# Patient Record
Sex: Male | Born: 1940
Health system: Southern US, Community
[De-identification: ages and names within clinical notes are randomized; demographics above are authoritative.]

## PROBLEM LIST (undated history)

## (undated) DIAGNOSIS — I2510 Atherosclerotic heart disease of native coronary artery without angina pectoris: Secondary | ICD-10-CM

## (undated) DIAGNOSIS — E118 Type 2 diabetes mellitus with unspecified complications: Secondary | ICD-10-CM

## (undated) DIAGNOSIS — K469 Unspecified abdominal hernia without obstruction or gangrene: Secondary | ICD-10-CM

## (undated) DIAGNOSIS — I5022 Chronic systolic (congestive) heart failure: Secondary | ICD-10-CM

## (undated) DIAGNOSIS — E785 Hyperlipidemia, unspecified: Secondary | ICD-10-CM

## (undated) DIAGNOSIS — I428 Other cardiomyopathies: Secondary | ICD-10-CM

## (undated) DIAGNOSIS — R1312 Dysphagia, oropharyngeal phase: Secondary | ICD-10-CM

## (undated) DIAGNOSIS — F418 Other specified anxiety disorders: Secondary | ICD-10-CM

## (undated) DIAGNOSIS — J301 Allergic rhinitis due to pollen: Secondary | ICD-10-CM

## (undated) DIAGNOSIS — K210 Gastro-esophageal reflux disease with esophagitis: Secondary | ICD-10-CM

## (undated) DIAGNOSIS — I10 Essential (primary) hypertension: Secondary | ICD-10-CM

## (undated) DIAGNOSIS — G7249 Other inflammatory and immune myopathies, not elsewhere classified: Secondary | ICD-10-CM

## (undated) DIAGNOSIS — G5793 Unspecified mononeuropathy of bilateral lower limbs: Secondary | ICD-10-CM

## (undated) HISTORY — DX: Essential (primary) hypertension: I10

## (undated) HISTORY — DX: Allergic rhinitis due to pollen: J30.1

## (undated) HISTORY — DX: Other inflammatory and immune myopathies, not elsewhere classified: G72.49

## (undated) HISTORY — DX: Atherosclerotic heart disease of native coronary artery without angina pectoris: I25.10

## (undated) HISTORY — PX: BACK SURGERY: SHX140

## (undated) HISTORY — DX: Hyperlipidemia, unspecified: E78.5

## (undated) HISTORY — DX: Dysphagia, oropharyngeal phase: R13.12

## (undated) HISTORY — DX: Gastro-esophageal reflux disease with esophagitis: K21.0

## (undated) HISTORY — DX: Unspecified mononeuropathy of bilateral lower limbs: G57.93

## (undated) HISTORY — DX: Chronic systolic (congestive) heart failure: I50.22

## (undated) HISTORY — PX: HERNIA REPAIR: SHX51

## (undated) HISTORY — DX: Type 2 diabetes mellitus with unspecified complications: E11.8

## (undated) HISTORY — DX: Other cardiomyopathies: I42.8

## (undated) HISTORY — DX: Other specified anxiety disorders: F41.8

## (undated) HISTORY — PX: SPINE SURGERY: SHX786

---

## 1998-08-08 ENCOUNTER — Encounter: Payer: Self-pay | Admitting: Family Medicine

## 1998-08-08 ENCOUNTER — Ambulatory Visit (HOSPITAL_COMMUNITY): Admission: RE | Admit: 1998-08-08 | Discharge: 1998-08-08 | Payer: Self-pay | Admitting: Family Medicine

## 1999-06-20 ENCOUNTER — Ambulatory Visit (HOSPITAL_COMMUNITY): Admission: RE | Admit: 1999-06-20 | Discharge: 1999-06-20 | Payer: Self-pay | Admitting: Cardiovascular Disease

## 2000-01-09 ENCOUNTER — Encounter: Payer: Self-pay | Admitting: Family Medicine

## 2000-01-09 ENCOUNTER — Ambulatory Visit (HOSPITAL_COMMUNITY): Admission: RE | Admit: 2000-01-09 | Discharge: 2000-01-09 | Payer: Self-pay | Admitting: Family Medicine

## 2001-04-28 ENCOUNTER — Ambulatory Visit (HOSPITAL_COMMUNITY): Admission: RE | Admit: 2001-04-28 | Discharge: 2001-04-28 | Payer: Self-pay | Admitting: Family Medicine

## 2002-11-09 ENCOUNTER — Encounter: Payer: Self-pay | Admitting: General Surgery

## 2002-11-09 ENCOUNTER — Encounter: Admission: RE | Admit: 2002-11-09 | Discharge: 2002-11-09 | Payer: Self-pay | Admitting: General Surgery

## 2003-08-10 ENCOUNTER — Ambulatory Visit (HOSPITAL_COMMUNITY): Admission: RE | Admit: 2003-08-10 | Discharge: 2003-08-10 | Payer: Self-pay | Admitting: Gastroenterology

## 2003-08-17 ENCOUNTER — Ambulatory Visit (HOSPITAL_COMMUNITY): Admission: RE | Admit: 2003-08-17 | Discharge: 2003-08-17 | Payer: Self-pay | Admitting: General Surgery

## 2003-08-18 ENCOUNTER — Emergency Department (HOSPITAL_COMMUNITY): Admission: EM | Admit: 2003-08-18 | Discharge: 2003-08-18 | Payer: Self-pay | Admitting: Emergency Medicine

## 2013-04-12 ENCOUNTER — Other Ambulatory Visit: Payer: Self-pay | Admitting: Family Medicine

## 2013-04-12 DIAGNOSIS — M79669 Pain in unspecified lower leg: Secondary | ICD-10-CM

## 2013-04-12 DIAGNOSIS — I739 Peripheral vascular disease, unspecified: Secondary | ICD-10-CM

## 2013-04-15 ENCOUNTER — Ambulatory Visit
Admission: RE | Admit: 2013-04-15 | Discharge: 2013-04-15 | Disposition: A | Discharge status: Home / Self Care | Payer: Medicare Other | Source: Ambulatory Visit | Attending: Family Medicine | Admitting: Family Medicine

## 2013-04-15 DIAGNOSIS — M79669 Pain in unspecified lower leg: Secondary | ICD-10-CM

## 2013-04-15 DIAGNOSIS — I739 Peripheral vascular disease, unspecified: Secondary | ICD-10-CM

## 2014-05-17 ENCOUNTER — Other Ambulatory Visit: Payer: Self-pay | Admitting: Family Medicine

## 2014-05-17 DIAGNOSIS — G459 Transient cerebral ischemic attack, unspecified: Secondary | ICD-10-CM

## 2014-05-24 ENCOUNTER — Encounter (INDEPENDENT_AMBULATORY_CARE_PROVIDER_SITE_OTHER): Payer: Self-pay

## 2014-05-24 ENCOUNTER — Ambulatory Visit
Admission: RE | Admit: 2014-05-24 | Discharge: 2014-05-24 | Disposition: A | Discharge status: Home / Self Care | Payer: Medicare Other | Source: Ambulatory Visit | Attending: Family Medicine | Admitting: Family Medicine

## 2014-05-24 DIAGNOSIS — G459 Transient cerebral ischemic attack, unspecified: Secondary | ICD-10-CM

## 2014-12-16 ENCOUNTER — Encounter (HOSPITAL_COMMUNITY): Payer: Self-pay | Admitting: *Deleted

## 2014-12-16 ENCOUNTER — Emergency Department (HOSPITAL_COMMUNITY)
Admission: EM | Admit: 2014-12-16 | Discharge: 2014-12-16 | Disposition: A | Discharge status: Home / Self Care | Payer: Medicare Other | Attending: Emergency Medicine | Admitting: Emergency Medicine

## 2014-12-16 ENCOUNTER — Emergency Department (HOSPITAL_COMMUNITY): Payer: Medicare Other

## 2014-12-16 DIAGNOSIS — Z880 Allergy status to penicillin: Secondary | ICD-10-CM | POA: Insufficient documentation

## 2014-12-16 DIAGNOSIS — Z8719 Personal history of other diseases of the digestive system: Secondary | ICD-10-CM | POA: Diagnosis not present

## 2014-12-16 DIAGNOSIS — R110 Nausea: Secondary | ICD-10-CM | POA: Diagnosis not present

## 2014-12-16 DIAGNOSIS — R42 Dizziness and giddiness: Secondary | ICD-10-CM | POA: Diagnosis not present

## 2014-12-16 DIAGNOSIS — Z79899 Other long term (current) drug therapy: Secondary | ICD-10-CM | POA: Diagnosis not present

## 2014-12-16 HISTORY — DX: Unspecified abdominal hernia without obstruction or gangrene: K46.9

## 2014-12-16 LAB — COMPREHENSIVE METABOLIC PANEL
ALK PHOS: 45 U/L (ref 39–117)
ALT: 26 U/L (ref 0–53)
AST: 21 U/L (ref 0–37)
Albumin: 4.1 g/dL (ref 3.5–5.2)
Anion gap: 7 (ref 5–15)
BUN: 15 mg/dL (ref 6–23)
CALCIUM: 9.4 mg/dL (ref 8.4–10.5)
CHLORIDE: 101 mmol/L (ref 96–112)
CO2: 30 mmol/L (ref 19–32)
CREATININE: 0.95 mg/dL (ref 0.50–1.35)
GFR, EST NON AFRICAN AMERICAN: 80 mL/min — AB (ref >90)
GLUCOSE: 135 mg/dL — AB (ref 70–99)
POTASSIUM: 4.1 mmol/L (ref 3.5–5.1)
SODIUM: 138 mmol/L (ref 135–145)
Total Bilirubin: 1.3 mg/dL — ABNORMAL HIGH (ref 0.3–1.2)
Total Protein: 6.5 g/dL (ref 6.0–8.3)

## 2014-12-16 LAB — CBC
HCT: 47.4 % (ref 39.0–52.0)
Hemoglobin: 15.9 g/dL (ref 13.0–17.0)
MCH: 29.8 pg (ref 26.0–34.0)
MCHC: 33.5 g/dL (ref 30.0–36.0)
MCV: 88.9 fL (ref 78.0–100.0)
Platelets: 203 10*3/uL (ref 150–400)
RBC: 5.33 MIL/uL (ref 4.22–5.81)
RDW: 13.5 % (ref 11.5–15.5)
WBC: 7.2 10*3/uL (ref 4.0–10.5)

## 2014-12-16 LAB — DIFFERENTIAL
BASOS PCT: 0 % (ref 0–1)
Basophils Absolute: 0 10*3/uL (ref 0.0–0.1)
EOS ABS: 0.1 10*3/uL (ref 0.0–0.7)
Eosinophils Relative: 2 % (ref 0–5)
LYMPHS ABS: 1.6 10*3/uL (ref 0.7–4.0)
Lymphocytes Relative: 22 % (ref 12–46)
MONOS PCT: 8 % (ref 3–12)
Monocytes Absolute: 0.6 10*3/uL (ref 0.1–1.0)
Neutro Abs: 4.9 10*3/uL (ref 1.7–7.7)
Neutrophils Relative %: 68 % (ref 43–77)

## 2014-12-16 LAB — PROTIME-INR
INR: 0.98 (ref 0.00–1.49)
PROTHROMBIN TIME: 13.1 s (ref 11.6–15.2)

## 2014-12-16 LAB — I-STAT TROPONIN, ED: Troponin i, poc: 0.01 ng/mL (ref 0.00–0.08)

## 2014-12-16 LAB — APTT: aPTT: 24 seconds (ref 24–37)

## 2014-12-16 MED ORDER — LORAZEPAM 1 MG PO TABS
1.0000 mg | ORAL_TABLET | Freq: Once | ORAL | Status: AC
  Administered 2014-12-16: 1 mg via ORAL
  Filled 2014-12-16: qty 1

## 2014-12-16 MED ORDER — MECLIZINE HCL 50 MG PO TABS: 25.0000 mg | ORAL_TABLET | Freq: Two times a day (BID) | ORAL | Status: DC

## 2014-12-16 MED ORDER — ONDANSETRON HCL 4 MG PO TABS: 4.0000 mg | ORAL_TABLET | Freq: Four times a day (QID) | ORAL | Status: DC

## 2014-12-16 MED ORDER — MECLIZINE HCL 50 MG PO TABS: 25.0000 mg | ORAL_TABLET | Freq: Three times a day (TID) | ORAL | Status: DC | PRN

## 2014-12-16 NOTE — ED Provider Notes (Signed)
CSN: [id number]     Arrival date & time 12/16/14  1417 History   First MD Initiated Contact with Patient 12/16/14 1704     Chief Complaint  Patient presents with  . Dizziness  . Nausea     (Consider location/radiation/quality/duration/timing/severity/associated sxs/prior Treatment) Patient is a 74 y.o. male presenting with dizziness. The history is provided by the patient. No language interpreter was used.  Dizziness Associated symptoms: nausea   Associated symptoms: no blood in stool, no chest pain, no headaches, no shortness of breath, no vomiting and no weakness   Christian [name] presents with new onset dizziness and nausea at 10am this morning while getting out of bed. He describes it as being off balance.  He does not have any pain, dizziness, or nausea now. He is currently taking Claritin D for ear congestion. He denies tinnitus, cough, rhinorrhea. He denies fever, chest pain, shortness of breath, loss of consciousness, syncope, or vomiting.  He has a history of a prior TIA.   Past Medical History  Diagnosis Date  . Hernia of abdominal cavity    Past Surgical History  Procedure Laterality Date  . Back surgery     History reviewed. No pertinent family history. History  Substance Use Topics  . Smoking status: Never Smoker   . Smokeless tobacco: Not on file  . Alcohol Use: No    Review of Systems  Constitutional: Negative for fever and chills.  Eyes: Negative for visual disturbance.  Respiratory: Negative for cough, chest tightness and shortness of breath.   Cardiovascular: Negative for chest pain and leg swelling.  Gastrointestinal: Positive for nausea. Negative for vomiting, abdominal pain and blood in stool.  Genitourinary: Negative for hematuria and difficulty urinating.  Musculoskeletal: Negative for neck pain.  Skin: Negative for rash.  Neurological: Positive for dizziness. Negative for seizures, syncope, speech difficulty, weakness, light-headedness, numbness and  headaches.  Hematological: Does not bruise/bleed easily.  All other systems reviewed and are negative.     Allergies  Amoxicillin; Bactrim; Cephalexin; Hydrocodone; Lipitor; Oxycodone; and Vytorin  Home Medications   Prior to Admission medications   Medication Sig Start Date End Date Taking? Authorizing Provider  Cholecalciferol (VITAMIN D) 2000 UNITS tablet Take 2,000 Units by mouth daily.   Yes Historical Provider, MD  esomeprazole (NEXIUM) 40 MG capsule Take 40 mg by mouth daily as needed (acid reflux).   Yes Historical Provider, MD  loratadine-pseudoephedrine (CLARITIN-D 12-HOUR) 5-120 MG per tablet Take 1 tablet by mouth 2 (two) times daily as needed for allergies (ear congestion).   Yes Historical Provider, MD  olopatadine (PATANOL) 0.1 % ophthalmic solution Place 1 drop into both eyes daily.   Yes Historical Provider, MD  meclizine (ANTIVERT) 50 MG tablet Take 0.5 tablets (25 mg total) by mouth 3 (three) times daily as needed. 12/16/14   Markian Glockner Patel-Mills, PA-C  ondansetron (ZOFRAN) 4 MG tablet Take 1 tablet (4 mg total) by mouth every 6 (six) hours. 12/16/14   Thaddaeus Granja Patel-Mills, PA-C   BP 138/96 mmHg  Pulse 97  Temp(Src) 97.5 F (36.4 C) (Oral)  Resp 20  Ht 5\' 6"  (1.676 m)  Wt 168 lb (76.204 kg)  BMI 27.13 kg/m2  SpO2 96% Physical Exam  Constitutional: He is oriented to person, place, and time. He appears well-developed and well-nourished.  HENT:  Head: Normocephalic and atraumatic.  Eyes: Conjunctivae are normal.  Neck: Normal range of motion. Neck supple.  Cardiovascular: Normal rate, regular rhythm and normal heart sounds.   Pulmonary/Chest: Effort normal  and breath sounds normal.  Abdominal: Soft. There is no tenderness.  Musculoskeletal: Normal range of motion.  Neurological: He is alert and oriented to person, place, and time. He has normal strength. No cranial nerve deficit or sensory deficit. Coordination normal.  Skin: Skin is warm and dry.  Nursing note and  vitals reviewed.   ED Course  Procedures (including critical care time) Labs Review Labs Reviewed  COMPREHENSIVE METABOLIC PANEL - Abnormal; Notable for the following:    Glucose, Bld 135 (*)    Total Bilirubin 1.3 (*)    GFR calc non Af Amer 80 (*)    All other components within normal limits  PROTIME-INR  APTT  CBC  DIFFERENTIAL  Randolm Idol, ED    Imaging Review Mr Brain Wo Contrast  12/16/2014   CLINICAL DATA:  Awoke with dizziness and nausea. Hypertension. Dizziness improved when lying down.  EXAM: MRI HEAD WITHOUT CONTRAST  TECHNIQUE: Multiplanar, multiecho pulse sequences of the brain and surrounding structures were obtained without intravenous contrast.  COMPARISON:  None.  FINDINGS: The diffusion-weighted images demonstrate no evidence for acute or subacute infarction. No hemorrhage or mass lesion is present.  Mild generalized atrophy is present. Moderate periventricular and scattered subcortical T2 hyperintensities are greater than expected for age. There is relatively little extension of white matter disease into the posterior fossa. No significant extraaxial fluid collection is present.  Flow is present in the major intracranial arteries. Globes and orbits are intact. The paranasal sinuses and mastoid air cells are clear. The skullbase is within normal limits. Midline structures are unremarkable.  IMPRESSION: 1. No acute or focal lesion to explain the patient's symptoms. 2. Moderate age advanced periventricular and subcortical white matter disease bilaterally.   Electronically Signed   By: San Morelle M.D.   On: 12/16/2014 19:16     EKG Interpretation   Date/Time:  Saturday December 16 2014 14:29:27 EST Ventricular Rate:  93 PR Interval:  182 QRS Duration: 80 QT Interval:  370 QTC Calculation: 460 R Axis:   -6 Text Interpretation:  Normal sinus rhythm Anterior infarct , age  undetermined Abnormal ECG Since last tracing rate faster Confirmed by  MILLER   MD, BRIAN ([id number]) on 12/16/2014 5:03:12 PM      MDM   Final diagnoses:  Dizziness    Imaging; MRI negative for acute lesion.  Labs; troponin negative  Impression; No sign of intracranial bleed.  Will dispo with Meclizine and Zofran.  He has an appointment with his otolaryngologist next week.    Ottie Glazier, PA-C 12/16/14 2101  Johnna Acosta, MD 12/17/14 0000

## 2014-12-16 NOTE — Discharge Instructions (Signed)
Vertigo Vertigo means you feel like you are moving when you are not. Vertigo can make you feel like things around you are moving when they are not. This problem often goes away on its own.  HOME CARE   Follow your doctor's instructions.  Avoid driving.  Avoid using heavy machinery.  Avoid doing any activity that could be dangerous if you have a vertigo attack.  Tell your doctor if a medicine seems to cause your vertigo. GET HELP RIGHT AWAY IF:   Your medicines do not help or make you feel worse.  You have trouble talking or walking.  You feel weak or have trouble using your arms, hands, or legs.  You have bad headaches.  You keep feeling sick to your stomach (nauseous) or throwing up (vomiting).  Your vision changes.  A family member notices changes in your behavior.  Your problems get worse. MAKE SURE YOU:  Understand these instructions.  Will watch your condition.  Will get help right away if you are not doing well or get worse. Document Released: 07/15/2008 Document Revised: 12/29/2011 Document Reviewed: 04/24/2011 Exit[employer] Patient Information 2015 [employer]. This information is not intended to replace advice given to you by your health care provider. Make sure you discuss any questions you have with your health care provider.  

## 2014-12-16 NOTE — ED Notes (Signed)
Patient transported to MRI 

## 2014-12-16 NOTE — ED Provider Notes (Signed)
The patient is a 74 year old male who presents after becoming acutely dizzy this morning. He describes this as feeling off balance like he could not walk straight. He did become nauseated and he does report a recent history of ear congestion for which she has been taking Claritin-D. He denies tinnitus, denies a vertigo feeling, states that the symptoms had gone away while he was sitting down and then recurred when he stood up again. At this time he has no symptoms, he denies fevers chills nausea vomiting, chest pain shortness of breath coughing fevers chills abdominal pain diarrhea dysuria rashes or swelling. He has no numbness or weakness.  Speech is clear, cranial nerves III through XII are intact, memory is intact, strength is normal in all 4 extremities including grips, sensation is intact to light touch and pinprick in all 4 extremities. Coordination as tested by finger-nose-finger is normal, no limb ataxia. Normal gait, normal reflexes at the patellar tendons bilaterally, there are no carotid bruits, clear heart and lung sounds, no peripheral edema.  The patient has an unremarkable EKG.   EKG Interpretation  Date/Time:  Saturday December 16 2014 14:29:27 EST Ventricular Rate:  93 PR Interval:  182 QRS Duration: 80 QT Interval:  370 QTC Calculation: 460 R Axis:   -6 Text Interpretation:  Normal sinus rhythm Anterior infarct , age undetermined Abnormal ECG Since last tracing rate faster Confirmed by Jadaya Sommerfield  MD, Rhyker Silversmith ([id number]) on 12/16/2014 5:03:12 PM      He will need an MRI to further evaluate his posterior circulation as his symptoms could be related to a posterior stroke though given his her congestion and recent use of these medications it would suggest that this is the type of vertigo. It away he is not having symptoms at this time, he is mildly hypertensive which also may be related to the medications.  MRI neg, pt stable for d/c.  Medical screening examination/treatment/procedure(s)  were conducted as a shared visit with non-physician practitioner(s) and myself.  I personally evaluated the patient during the encounter.  Clinical Impression:   Final diagnoses:  Dizziness         Johnna Acosta, MD 12/17/14 0000

## 2014-12-16 NOTE — ED Notes (Signed)
Pt reports waking up this am with dizziness, nausea and reports htn. bp is 156/105 at triage. Reports dizziness improves when lying down and pt feels like room is spinning.

## 2016-07-09 LAB — HEMOGLOBIN A1C: Hemoglobin A1C: 5.8

## 2016-07-09 LAB — BASIC METABOLIC PANEL
BUN: 20 (ref 4–21)
CREATININE: 1 (ref 0.6–1.3)
GLUCOSE: 112
POTASSIUM: 4.2 (ref 3.4–5.3)
SODIUM: 139 (ref 137–147)

## 2016-07-09 LAB — LIPID PANEL
Cholesterol: 263 — AB (ref 0–200)
HDL: 46 (ref 35–70)
LDL Cholesterol: 195
Triglycerides: 112 (ref 40–160)

## 2016-07-09 LAB — CBC AND DIFFERENTIAL
HCT: 46 (ref 41–53)
Hemoglobin: 14.7 (ref 13.5–17.5)
Platelets: 229 (ref 150–399)
WBC: 6.2

## 2016-07-09 LAB — HEPATIC FUNCTION PANEL
ALT: 15 (ref 10–40)
AST: 18 (ref 14–40)
Alkaline Phosphatase: 38 (ref 25–125)
BILIRUBIN, TOTAL: 1.2

## 2016-07-09 LAB — TSH: TSH: 0.83 (ref 0.41–5.90)

## 2017-05-13 ENCOUNTER — Telehealth: Payer: Self-pay | Admitting: Internal Medicine

## 2017-05-13 NOTE — Telephone Encounter (Signed)
Patient is requesting to establish care with Dr. Jones.  Please advise.   °

## 2017-05-14 NOTE — Telephone Encounter (Signed)
yes

## 2017-05-15 NOTE — Telephone Encounter (Signed)
Left patient vm to call back to establish care

## 2017-05-25 ENCOUNTER — Encounter: Payer: Self-pay | Admitting: Internal Medicine

## 2017-05-25 ENCOUNTER — Ambulatory Visit (INDEPENDENT_AMBULATORY_CARE_PROVIDER_SITE_OTHER): Payer: Medicare Other | Admitting: Internal Medicine

## 2017-05-25 VITALS — BP 140/90 | HR 96 | Temp 98.2°F | Resp 16 | Ht 66.0 in | Wt 177.2 lb

## 2017-05-25 DIAGNOSIS — G7249 Other inflammatory and immune myopathies, not elsewhere classified: Secondary | ICD-10-CM

## 2017-05-25 DIAGNOSIS — E785 Hyperlipidemia, unspecified: Secondary | ICD-10-CM | POA: Insufficient documentation

## 2017-05-25 DIAGNOSIS — R739 Hyperglycemia, unspecified: Secondary | ICD-10-CM

## 2017-05-25 DIAGNOSIS — G5793 Unspecified mononeuropathy of bilateral lower limbs: Secondary | ICD-10-CM

## 2017-05-25 HISTORY — DX: Other inflammatory and immune myopathies, not elsewhere classified: G72.49

## 2017-05-25 HISTORY — DX: Hyperlipidemia, unspecified: E78.5

## 2017-05-25 HISTORY — DX: Unspecified mononeuropathy of bilateral lower limbs: G57.93

## 2017-05-25 MED ORDER — PREGABALIN 50 MG PO CAPS: 50.0000 mg | ORAL_CAPSULE | Freq: Two times a day (BID) | ORAL | 1 refills | Status: DC

## 2017-05-25 MED ORDER — ZOSTER VAC RECOMB ADJUVANTED 50 MCG/0.5ML IM SUSR: 0.5000 mL | Freq: Once | INTRAMUSCULAR | 1 refills | Status: AC

## 2017-05-25 NOTE — Progress Notes (Signed)
Subjective:  Patient ID: Christian [name], male    DOB: September 25, 1941  Age: 76 y.o. MRN: [id number]  CC: Leg Pain  NEW TO ME  HPI Christian [name] presents for the complaint of LE pain for 13 years. He claims that the symptoms started when he was prescribed a statin. He complains of intermittent feeling like his legs are numb but when he wakes up in the morning and tries to get his day started he has diffuse lower extremity discomfort that he describes as a sharp and stabbing sensation. The pain gets better when he runs on a treadmill or massages his thighs. He has also noticed that a course of prednisone makes the pain better. He has mild lower extremity weakness and has had a few episodes where he felt like the left leg was coming give way on him. He tells me his previous doctor did arterial flow studies that were normal. He has never had a NCS or EMG. He tells me he's had extensive lab work done regarding this but I have no copies of those lab results today. He had lumbar surgery many decades ago.  Outpatient Medications Prior to Visit  Medication Sig Dispense Refill  . Cholecalciferol (VITAMIN D) 2000 UNITS tablet Take 2,000 Units by mouth daily.    Marland Kitchen esomeprazole (NEXIUM) 40 MG capsule Take 40 mg by mouth daily as needed (acid reflux).    Marland Kitchen olopatadine (PATANOL) 0.1 % ophthalmic solution Place 1 drop into both eyes daily.    Marland Kitchen loratadine-pseudoephedrine (CLARITIN-D 12-HOUR) 5-120 MG per tablet Take 1 tablet by mouth 2 (two) times daily as needed for allergies (ear congestion).    . meclizine (ANTIVERT) 50 MG tablet Take 0.5 tablets (25 mg total) by mouth 3 (three) times daily as needed. 30 tablet 0  . ondansetron (ZOFRAN) 4 MG tablet Take 1 tablet (4 mg total) by mouth every 6 (six) hours. 12 tablet 0   No facility-administered medications prior to visit.     ROS Review of Systems  Constitutional: Negative for appetite change, diaphoresis, fatigue and unexpected weight change.  HENT: Negative.    Eyes: Negative.   Respiratory: Negative.  Negative for cough, chest tightness, shortness of breath and wheezing.   Cardiovascular: Negative for chest pain, palpitations and leg swelling.  Gastrointestinal: Negative for abdominal pain, constipation, diarrhea, nausea and vomiting.  Endocrine: Negative.   Genitourinary: Negative.  Negative for difficulty urinating.  Musculoskeletal: Positive for myalgias. Negative for arthralgias, back pain, joint swelling and neck stiffness.  Skin: Negative.  Negative for color change and rash.  Allergic/Immunologic: Negative.   Neurological: Positive for weakness and numbness. Negative for dizziness, facial asymmetry, light-headedness and headaches.  Hematological: Negative for adenopathy. Does not bruise/bleed easily.  Psychiatric/Behavioral: Negative.     Objective:  BP 140/90 (BP Location: Left Arm, Patient Position: Sitting, Cuff Size: Normal)   Pulse 96   Temp 98.2 F (36.8 C) (Oral)   Resp 16   Ht 5\' 6"  (1.676 m)   Wt 177 lb 4 oz (80.4 kg)   SpO2 96%   BMI 28.61 kg/m   BP Readings from Last 3 Encounters:  05/25/17 140/90  12/16/14 138/96    Wt Readings from Last 3 Encounters:  05/25/17 177 lb 4 oz (80.4 kg)  12/16/14 168 lb (76.2 kg)    Physical Exam  Constitutional: He is oriented to person, place, and time. No distress.  HENT:  Mouth/Throat: Oropharynx is clear and moist. No oropharyngeal exudate.  Eyes: Conjunctivae are normal.  Right eye exhibits no discharge. Left eye exhibits no discharge. No scleral icterus.  Neck: Normal range of motion. Neck supple. No JVD present. No thyromegaly present.  Cardiovascular: Normal rate, regular rhythm and intact distal pulses.  Exam reveals no gallop and no friction rub.   No murmur heard. Pulmonary/Chest: Effort normal and breath sounds normal. No respiratory distress. He has no wheezes. He has no rales. He exhibits no tenderness.  Abdominal: Soft. Bowel sounds are normal. He exhibits no  distension and no mass. There is no tenderness. There is no rebound and no guarding.  Musculoskeletal: Normal range of motion. He exhibits no edema, tenderness or deformity.  Lymphadenopathy:    He has no cervical adenopathy.  Neurological: He is alert and oriented to person, place, and time. He displays no atrophy, no tremor and normal reflexes. No cranial nerve deficit or sensory deficit. He exhibits normal muscle tone. He displays no seizure activity. Coordination and gait normal.  Reflex Scores:      Tricep reflexes are 1+ on the right side and 1+ on the left side.      Bicep reflexes are 1+ on the right side and 1+ on the left side.      Brachioradialis reflexes are 1+ on the right side and 1+ on the left side.      Patellar reflexes are 0 on the right side and 0 on the left side.      Achilles reflexes are 0 on the right side and 0 on the left side. Neg SLR in BLE  Skin: Skin is warm and dry. No rash noted. He is not diaphoretic. No erythema. No pallor.  Vitals reviewed.   Lab Results  Component Value Date   WBC 6.2 07/09/2016   HGB 14.7 07/09/2016   HCT 46 07/09/2016   PLT 229 07/09/2016   GLUCOSE 135 (H) 12/16/2014   CHOL 263 (A) 07/09/2016   TRIG 112 07/09/2016   HDL 46 07/09/2016   LDLCALC 195 07/09/2016   ALT 15 07/09/2016   AST 18 07/09/2016   NA 139 07/09/2016   K 4.2 07/09/2016   CL 101 12/16/2014   CREATININE 1.0 07/09/2016   BUN 20 07/09/2016   CO2 30 12/16/2014   TSH 0.83 07/09/2016   INR 0.98 12/16/2014   HGBA1C 5.8 07/09/2016    Mr Brain Wo Contrast  Result Date: 12/16/2014 CLINICAL DATA:  Awoke with dizziness and nausea. Hypertension. Dizziness improved when lying down. EXAM: MRI HEAD WITHOUT CONTRAST TECHNIQUE: Multiplanar, multiecho pulse sequences of the brain and surrounding structures were obtained without intravenous contrast. COMPARISON:  None. FINDINGS: The diffusion-weighted images demonstrate no evidence for acute or subacute infarction. No  hemorrhage or mass lesion is present. Mild generalized atrophy is present. Moderate periventricular and scattered subcortical T2 hyperintensities are greater than expected for age. There is relatively little extension of white matter disease into the posterior fossa. No significant extraaxial fluid collection is present. Flow is present in the major intracranial arteries. Globes and orbits are intact. The paranasal sinuses and mastoid air cells are clear. The skullbase is within normal limits. Midline structures are unremarkable. IMPRESSION: 1. No acute or focal lesion to explain the patient's symptoms. 2. Moderate age advanced periventricular and subcortical white matter disease bilaterally. Electronically Signed   By: San Morelle M.D.   On: 12/16/2014 19:16    Assessment & Plan:   Yates was seen today for leg pain.  Diagnoses and all orders for this visit:  Hyperglycemia -  Cancel: Basic metabolic panel; Future -     Cancel: Hemoglobin A1c; Future  Hyperlipidemia LDL goal <130- he has not achieved his LDL goal but is also not willing to take a statin. -     Cancel: Lipid panel; Future -     Cancel: Thyroid Panel With TSH; Future  Neuropathy involving both lower extremities- as below -     Ambulatory referral to Neurology -     pregabalin (LYRICA) 50 MG capsule; Take 1 capsule (50 mg total) by mouth 2 (two) times daily.  Idiopathic inflammatory myopathy- He will undergo a NCS/EMG to identify the cause of his sxs, will try lyrica for sx relief. He will have copies of previous labs forwarded to me so that I can see if he has had a thorough workup for metabolic, inflammatory, and rheumatologic causes. -     pregabalin (LYRICA) 50 MG capsule; Take 1 capsule (50 mg total) by mouth 2 (two) times daily.  Other orders -     Zoster Vac Recomb Adjuvanted [facility]) injection; Inject 0.5 mLs into the muscle once.   I have discontinued Mr. Valeriano loratadine-pseudoephedrine, meclizine,  and ondansetron. I am also having him start on pregabalin and Zoster Vac Recomb Adjuvanted. Additionally, I am having him maintain his olopatadine, esomeprazole, and Vitamin D.  Meds ordered this encounter  Medications  . pregabalin (LYRICA) 50 MG capsule    Sig: Take 1 capsule (50 mg total) by mouth 2 (two) times daily.    Dispense:  180 capsule    Refill:  1  . Zoster Vac Recomb Adjuvanted [facility]) injection    Sig: Inject 0.5 mLs into the muscle once.    Dispense:  1 each    Refill:  1     Follow-up: Return in about 2 months (around 07/25/2017).  Scarlette Calico, MD

## 2017-05-25 NOTE — Patient Instructions (Signed)
Focal Neuropathy  Focal neuropathy is a nerve injury that affects one area of the body, such as an arm, a leg, or the face. The injury may involve one nerve or a small group of nerves. Examples of focal neuropathy include brachial plexus injury and [place]-Turner syndrome.  What are the causes?  This condition may be caused by:   A sudden cut or stretch. This can happen because of an accident or during surgery.   A small injury that happens again and again.   A compression injury. This kind of injury happens when a blood vessel, a tumor, or something else presses on a nerve for a long time.   Entrapment. This can happen to a nerve that has to pass through a narrow area.   Lack of blood to the nerves. This can be caused by certain medical conditions, like diabetes or vasculitis.   Extreme cold.   Severe burns.   Radiation.    What are the signs or symptoms?  Symptoms of this condition depend on where the damaged nerve is and what kind of nerve it is. For example, damage to nerves that carry signals away from the brain can cause symptoms related to movement, but damage to nerves that carry signals to the brain can cause symptoms related to feeling and pain.  Symptoms affect only one area of the body. Common symptoms include:   Numbness.   Tingling.   A burning pain.   A prickling feeling.   Very sensitive skin.   Weakness.   Paralysis.   Muscle twitching.   Muscles getting smaller (muscle wasting).   Poor coordination.    How is this diagnosed?  This condition may be diagnosed with:   A neurologic exam. During the exam, your health care provider will check your reflexes, how you move, and what you can feel.   Tests. Tests may be done to help find the area that has been damaged. Tests may include:  ? Imaging tests, such as a CT scan or MRI.  ? Electromyogram (EMG). This test checks the nerves that control your muscles.  ? Nerve conduction velocity (NCV) tests. These tests check how quickly  messages pass through your nerves.    How is this treated?  Treatment for this condition depends on what damaged the nerve and how much of the nerve is damaged. Treatment may involve:   Surgery to ease pressure on a nerve. This may be done if you have a compression injury or entrapment.   Medicines, such as:  ? Medicines for pain, such painkillers, certain anti-seizure medicines, or antidepressants.  ? Steroid medicines. These may be given in pill form or with a shot (injection).   Physical therapy (PT) to help movement.   Splints or other devices to help movement.    Follow these instructions at home:     Take over-the-counter and prescription medicines only as told by your health care provider.   If you were given a splint or other device to help with movement, use it as told by your health care provider.   If any part of your body is numb, check it every day for signs of injury or infection. Watch for:  ? Redness, swelling, or pain.  ? Fluid or blood.  ? Warmth.  ? Pus or a bad smell.   Do not do things that put pressure on your damaged nerve. You may have to avoid certain activities or avoid sitting or lying in a certain way.     Do not smoke. Smoking keeps blood from getting to damaged nerves. If you need help quitting, ask your health care provider.   Limit alcohol intake, or avoid alcohol completely. Too much alcohol can cause a lack of B vitamins, which are needed for healthy nerves.   Have a good support system. Coping with focal neuropathy can be stressful. Talk with a mental health caregiver or join a support group if you are struggling.   Keep all follow-up visits as told by your health care provider. This is important. These include PT visits.  Contact a health care provider if:   You think you have an injury or infection.   You have new symptoms.   You are struggling emotionally from dealing with your condition.  Get help right away if:   You have severe pain that comes on  suddenly.   You develop any paralysis.   You lose feeling in any part of your body.  This information is not intended to replace advice given to you by your health care provider. Make sure you discuss any questions you have with your health care provider.  Document Released: 09/17/2015 Document Revised: 03/13/2016 Document Reviewed: 05/04/2015  [employer]vier Interactive Patient Education  2018 [employer]

## 2017-06-02 ENCOUNTER — Telehealth: Payer: Self-pay | Admitting: Internal Medicine

## 2017-06-02 ENCOUNTER — Encounter: Payer: Self-pay | Admitting: Neurology

## 2017-06-02 NOTE — Telephone Encounter (Signed)
Pt called stating that he was recently seen by Dr Ronnald Ramp and wanted to make a few corrections to his medication list. He said that he needed to add Prednizone 20mg  once daily as needed and Vitamin D should be changed to 1,000 units per day.

## 2017-06-02 NOTE — Telephone Encounter (Signed)
yes

## 2017-06-02 NOTE — Telephone Encounter (Signed)
Do you want me to add this to his medication list?

## 2017-06-03 ENCOUNTER — Telehealth: Payer: Self-pay | Admitting: Neurology

## 2017-06-03 NOTE — Telephone Encounter (Signed)
Patient called needing to know if he could take the Lyrica before his appointment on 06/18/17 for an EMG? Please advise. Thanks

## 2017-06-04 NOTE — Telephone Encounter (Signed)
Patient notified that he can take his medication on the day of testing.

## 2017-06-10 ENCOUNTER — Other Ambulatory Visit: Payer: Self-pay | Admitting: *Deleted

## 2017-06-10 DIAGNOSIS — G629 Polyneuropathy, unspecified: Secondary | ICD-10-CM

## 2017-06-18 ENCOUNTER — Encounter: Payer: Medicare Other | Admitting: Neurology

## 2017-06-29 ENCOUNTER — Ambulatory Visit: Payer: Medicare Other | Admitting: Internal Medicine

## 2017-06-30 ENCOUNTER — Ambulatory Visit: Payer: Medicare Other | Admitting: Family Medicine

## 2017-06-30 ENCOUNTER — Ambulatory Visit: Payer: Medicare Other | Admitting: Internal Medicine

## 2017-07-07 ENCOUNTER — Ambulatory Visit (INDEPENDENT_AMBULATORY_CARE_PROVIDER_SITE_OTHER): Payer: Medicare Other | Admitting: Neurology

## 2017-07-07 DIAGNOSIS — G629 Polyneuropathy, unspecified: Secondary | ICD-10-CM

## 2017-07-07 NOTE — Procedures (Signed)
[facility]  [address], [place]  [place], [place] [address] Tel: [phone] Fax:  [phone] Test Date:  07/07/2017  Patient: Christian [name] DOB: 03/11/1941 Physician: Narda Amber, DO  Sex: Male Height: 5\' 6"  Ref Phys: Janith Lima, MD  ID#: [id number] Temp: 33.5C Technician:    Patient Complaints: This is a 76 year old gentleman referred for evaluation of bilateral leg pain and paresthesias.  NCV & EMG Findings: Extensive electrodiagnostic testing of the left lower extremity and additional studies of the right shows:  1. Bilateral sural and superficial peroneal sensory responses are within normal limits. 2. Bilateral tibial motor responses show mildly reduced amplitude. Bilateral peroneal motor responses are within normal limits. 3. Bilateral tibial H reflex studies are within normal limits. 4. Chronic motor axon loss changes are seen affecting the L5-S1 myotomes on the left. Needle electrode examination of the right lower extremity was not performed at patient's request due to intolerance of testing.  Impression: 1. Chronic L5-S1 radiculopathy affecting the left lower extremity; mild in degree electrically. 2. Needle electrode examination of the right lower extremity was not performed at patient's request due to intolerance of testing. 3. There is no evidence of a diffuse myopathy or sensorimotor polyneuropathy affecting the lower extremities.   ___________________________ Narda Amber, DO    Nerve Conduction Studies Anti Sensory Summary Table   Site NR Peak (ms) Norm Peak (ms) P-T Amp (V) Norm P-T Amp  Left Sup Peroneal Anti Sensory (Ant Lat Mall)  12 cm    2.8 <4.6 10.4 >3  Right Sup Peroneal Anti Sensory (Ant Lat Mall)  12 cm    2.9 <4.6 7.2 >3  Left Sural Anti Sensory (Lat Mall)  Calf    3.1 <4.6 11.5 >3  Right Sural Anti Sensory (Lat Mall)  Calf    3.2 <4.6 10.9 >3   Motor Summary Table   Site NR Onset (ms) Norm Onset (ms) O-P Amp (mV)  Norm O-P Amp Site1 Site2 Delta-0 (ms) Dist (cm) Vel (m/s) Norm Vel (m/s)  Left Peroneal Motor (Ext Dig Brev)  Ankle    4.4 <6.0 3.1 >2.5 B Fib Ankle 7.2 31.0 43 >40  B Fib    11.6  2.8  Poplt B Fib 1.5 8.0 53 >40  Poplt    13.1  2.7         Right Peroneal Motor (Ext Dig Brev)  Ankle    3.8 <6.0 2.6 >2.5 B Fib Ankle 7.3 39.0 53 >40  B Fib    11.1  2.2  Poplt B Fib 1.8 8.0 44 >40  Poplt    12.9  2.0         Left Peroneal TA Motor (Tib Ant)  Fib Head    3.7 <4.5 5.6 >3 Poplit Fib Head 1.1 8.0 73 >40  Poplit    4.8  5.5         Right Peroneal TA Motor (Tib Ant)  Fib Head    3.2 <4.5 6.4 >3 Poplit Fib Head 1.6 8.0 50 >40  Poplit    4.8  6.3         Left Tibial Motor (Abd Hall Brev)  Ankle    4.0 <6.0 3.3 >4 Knee Ankle 8.5 38.0 45 >40  Knee    12.5  3.1         Right Tibial Motor (Abd Hall Brev)  Ankle    3.9 <6.0 2.8 >4 Knee Ankle 8.0 36.0 45 >40  Knee  11.9  1.5          H Reflex Studies   NR H-Lat (ms) Lat Norm (ms) L-R H-Lat (ms)  Left Tibial (Gastroc)     34.01 <35 0.00  Right Tibial (Gastroc)     34.01 <35 0.00   EMG   Side Muscle Ins Act Fibs Psw Fasc Number Recrt Dur Dur. Amp Amp. Poly Poly. Comment  Right AntTibialis Nml Nml Nml Nml Nml Nml Nml Nml Nml Nml Nml Nml N/A  Left AntTibialis Nml Nml Nml Nml 1- Rapid Some 1+ Few 1+ Nml Nml N/A  Left Gastroc Nml Nml Nml Nml 1- Rapid Some 1+ Few Nml Nml Nml N/A  Left Flex Dig Long Nml Nml Nml Nml Nml Nml Nml Nml Nml Nml Nml Nml N/A  Left RectFemoris Nml Nml Nml Nml Nml Nml Nml Nml Nml Nml Nml Nml N/A  Left GluteusMed Nml Nml Nml Nml 1- Rapid Some 1+ Few 1+ Nml Nml N/A  Left BicepsFemS Nml Nml Nml Nml 1- Rapid Some 1+ Few 1+ Nml Nml N/A      Waveforms:

## 2017-07-08 ENCOUNTER — Ambulatory Visit (INDEPENDENT_AMBULATORY_CARE_PROVIDER_SITE_OTHER): Payer: Medicare Other | Admitting: Internal Medicine

## 2017-07-08 ENCOUNTER — Telehealth: Payer: Self-pay | Admitting: Internal Medicine

## 2017-07-08 ENCOUNTER — Other Ambulatory Visit (INDEPENDENT_AMBULATORY_CARE_PROVIDER_SITE_OTHER): Payer: Medicare Other

## 2017-07-08 ENCOUNTER — Encounter: Payer: Self-pay | Admitting: Internal Medicine

## 2017-07-08 VITALS — BP 140/90 | HR 90 | Temp 98.2°F | Resp 16 | Ht 66.0 in | Wt 180.0 lb

## 2017-07-08 DIAGNOSIS — R739 Hyperglycemia, unspecified: Secondary | ICD-10-CM

## 2017-07-08 DIAGNOSIS — Z23 Encounter for immunization: Secondary | ICD-10-CM | POA: Diagnosis not present

## 2017-07-08 DIAGNOSIS — F418 Other specified anxiety disorders: Secondary | ICD-10-CM

## 2017-07-08 DIAGNOSIS — I10 Essential (primary) hypertension: Secondary | ICD-10-CM | POA: Insufficient documentation

## 2017-07-08 DIAGNOSIS — E785 Hyperlipidemia, unspecified: Secondary | ICD-10-CM | POA: Diagnosis not present

## 2017-07-08 DIAGNOSIS — E118 Type 2 diabetes mellitus with unspecified complications: Secondary | ICD-10-CM

## 2017-07-08 DIAGNOSIS — G3184 Mild cognitive impairment, so stated: Secondary | ICD-10-CM | POA: Insufficient documentation

## 2017-07-08 DIAGNOSIS — E1169 Type 2 diabetes mellitus with other specified complication: Secondary | ICD-10-CM | POA: Insufficient documentation

## 2017-07-08 HISTORY — DX: Other specified anxiety disorders: F41.8

## 2017-07-08 HISTORY — DX: Essential (primary) hypertension: I10

## 2017-07-08 HISTORY — DX: Type 2 diabetes mellitus with unspecified complications: E11.8

## 2017-07-08 LAB — COMPREHENSIVE METABOLIC PANEL
ALBUMIN: 4.3 g/dL (ref 3.5–5.2)
ALK PHOS: 37 U/L — AB (ref 39–117)
ALT: 16 U/L (ref 0–53)
AST: 15 U/L (ref 0–37)
BUN: 21 mg/dL (ref 6–23)
CO2: 30 mEq/L (ref 19–32)
CREATININE: 1.06 mg/dL (ref 0.40–1.50)
Calcium: 9.2 mg/dL (ref 8.4–10.5)
Chloride: 102 mEq/L (ref 96–112)
GFR: 71.99 mL/min (ref >60.00)
GLUCOSE: 142 mg/dL — AB (ref 70–99)
Potassium: 4.3 mEq/L (ref 3.5–5.1)
SODIUM: 138 meq/L (ref 135–145)
TOTAL PROTEIN: 6.9 g/dL (ref 6.0–8.3)
Total Bilirubin: 1 mg/dL (ref 0.2–1.2)

## 2017-07-08 LAB — CBC WITH DIFFERENTIAL/PLATELET
BASOS PCT: 0.6 % (ref 0.0–3.0)
Basophils Absolute: 0.1 10*3/uL (ref 0.0–0.1)
Eosinophils Absolute: 0.1 10*3/uL (ref 0.0–0.7)
Eosinophils Relative: 0.9 % (ref 0.0–5.0)
HCT: 47.2 % (ref 39.0–52.0)
HEMOGLOBIN: 15.2 g/dL (ref 13.0–17.0)
LYMPHS ABS: 1.3 10*3/uL (ref 0.7–4.0)
Lymphocytes Relative: 13.8 % (ref 12.0–46.0)
MCHC: 32.3 g/dL (ref 30.0–36.0)
MCV: 91 fl (ref 78.0–100.0)
MONO ABS: 0.5 10*3/uL (ref 0.1–1.0)
Monocytes Relative: 5.4 % (ref 3.0–12.0)
Neutro Abs: 7.4 10*3/uL (ref 1.4–7.7)
Neutrophils Relative %: 79.3 % — ABNORMAL HIGH (ref 43.0–77.0)
Platelets: 249 10*3/uL (ref 150.0–400.0)
RBC: 5.18 Mil/uL (ref 4.22–5.81)
RDW: 14.2 % (ref 11.5–15.5)
WBC: 9.3 10*3/uL (ref 4.0–10.5)

## 2017-07-08 LAB — THYROID PANEL WITH TSH
FREE THYROXINE INDEX: 2.9 (ref 1.4–3.8)
T3 UPTAKE: 35 % (ref 22–35)
T4 TOTAL: 8.3 ug/dL (ref 4.9–10.5)
TSH: 1.22 m[IU]/L (ref 0.40–4.50)

## 2017-07-08 LAB — LIPID PANEL
CHOLESTEROL: 258 mg/dL — AB (ref 0–200)
HDL: 68.7 mg/dL (ref >39.00)
LDL CALC: 167 mg/dL — AB (ref 0–99)
NonHDL: 189.03
TRIGLYCERIDES: 109 mg/dL (ref 0.0–149.0)
Total CHOL/HDL Ratio: 4
VLDL: 21.8 mg/dL (ref 0.0–40.0)

## 2017-07-08 LAB — HEMOGLOBIN A1C: HEMOGLOBIN A1C: 6.5 % (ref 4.6–6.5)

## 2017-07-08 MED ORDER — ESCITALOPRAM OXALATE 10 MG PO TABS: 10.0000 mg | ORAL_TABLET | Freq: Every day | ORAL | 1 refills | Status: DC

## 2017-07-08 MED ORDER — TELMISARTAN 40 MG PO TABS: 40.0000 mg | ORAL_TABLET | Freq: Every day | ORAL | 1 refills | Status: DC

## 2017-07-08 MED ORDER — ASPIRIN EC 81 MG PO TBEC: 81.0000 mg | DELAYED_RELEASE_TABLET | Freq: Every day | ORAL | 3 refills | Status: DC

## 2017-07-08 MED ORDER — PITAVASTATIN CALCIUM 2 MG PO TABS: 1.0000 | ORAL_TABLET | Freq: Every day | ORAL | 1 refills | Status: DC

## 2017-07-08 NOTE — Progress Notes (Signed)
Subjective:  Patient ID: Christian [name], male    DOB: 1941/07/07  Age: 76 y.o. MRN: [id number]  CC: Depression; Hypertension; and Hyperlipidemia   HPI Christian [name] presents for f/up - He complains of depression. Over the last few months he has had anxiety, anhedonia, crying spells, and ruminations. He denies feeling hopeless, helpless, or suicidal. He has heard good things about Lexapro and wants to give it a try. He recently underwent a NCS/EMG to screen for myopathy/neuropathy and was only found to have a left lower extremity radiculopathy. He has had no symptoms related to that recently.  Outpatient Medications Prior to Visit  Medication Sig Dispense Refill  . Cholecalciferol (VITAMIN D) 2000 UNITS tablet Take 2,000 Units by mouth daily.    Marland Kitchen esomeprazole (NEXIUM) 40 MG capsule Take 40 mg by mouth daily as needed (acid reflux).    Marland Kitchen olopatadine (PATANOL) 0.1 % ophthalmic solution Place 1 drop into both eyes daily.    . pregabalin (LYRICA) 50 MG capsule Take 1 capsule (50 mg total) by mouth 2 (two) times daily. (Patient not taking: Reported on 07/08/2017) 180 capsule 1   No facility-administered medications prior to visit.     ROS Review of Systems  Constitutional: Positive for fatigue. Negative for appetite change, diaphoresis and unexpected weight change.  HENT: Negative.   Eyes: Negative.  Negative for visual disturbance.  Respiratory: Negative.  Negative for cough, chest tightness, shortness of breath and wheezing.   Cardiovascular: Negative for chest pain, palpitations and leg swelling.  Gastrointestinal: Negative for abdominal pain, constipation, diarrhea, nausea and vomiting.  Endocrine: Negative.   Genitourinary: Negative.  Negative for decreased urine volume and difficulty urinating.  Musculoskeletal: Negative.  Negative for arthralgias and myalgias.  Skin: Negative.   Allergic/Immunologic: Negative.   Neurological: Negative.  Negative for dizziness, weakness,  light-headedness and numbness.  Hematological: Negative for adenopathy. Does not bruise/bleed easily.  Psychiatric/Behavioral: Positive for dysphoric mood. Negative for confusion, decreased concentration and sleep disturbance. The patient is nervous/anxious. The patient is not hyperactive.     Objective:  BP 140/90 (BP Location: Left Arm, Patient Position: Sitting, Cuff Size: Normal)   Pulse 90   Temp 98.2 F (36.8 C) (Oral)   Resp 16   Ht 5\' 6"  (1.676 m)   Wt 180 lb (81.6 kg)   SpO2 98%   BMI 29.05 kg/m   BP Readings from Last 3 Encounters:  07/08/17 140/90  05/25/17 140/90  12/16/14 138/96    Wt Readings from Last 3 Encounters:  07/08/17 180 lb (81.6 kg)  05/25/17 177 lb 4 oz (80.4 kg)  12/16/14 168 lb (76.2 kg)    Physical Exam  Constitutional: He is oriented to person, place, and time. No distress.  HENT:  Mouth/Throat: Oropharynx is clear and moist. No oropharyngeal exudate.  Eyes: Conjunctivae are normal. Right eye exhibits no discharge. Left eye exhibits no discharge. No scleral icterus.  Neck: Normal range of motion. Neck supple. No JVD present. No thyromegaly present.  Cardiovascular: Normal rate, regular rhythm and intact distal pulses.  Exam reveals no gallop and no friction rub.   No murmur heard. Pulmonary/Chest: Effort normal and breath sounds normal. No respiratory distress. He has no wheezes. He has no rales. He exhibits no tenderness.  Abdominal: Soft. Bowel sounds are normal. He exhibits no distension and no mass. There is no tenderness. There is no rebound and no guarding.  Musculoskeletal: Normal range of motion. He exhibits no edema, tenderness or deformity.  Neurological: He is  alert and oriented to person, place, and time.  Skin: Skin is warm and dry. No rash noted. He is not diaphoretic. No erythema. No pallor.  Psychiatric: Judgment normal. His mood appears anxious. His speech is not rapid and/or pressured, not delayed and not tangential. He is not  aggressive, not slowed, not withdrawn and not combative. Cognition and memory are impaired. He exhibits a depressed mood. He expresses no homicidal and no suicidal ideation. He expresses no suicidal plans and no homicidal plans. He exhibits abnormal recent memory and abnormal remote memory.  Sad, crying intermittently He is inattentive.  Vitals reviewed.   Lab Results  Component Value Date   WBC 9.3 07/08/2017   HGB 15.2 07/08/2017   HCT 47.2 07/08/2017   PLT 249.0 07/08/2017   GLUCOSE 142 (H) 07/08/2017   CHOL 258 (H) 07/08/2017   TRIG 109.0 07/08/2017   HDL 68.70 07/08/2017   LDLCALC 167 (H) 07/08/2017   ALT 16 07/08/2017   AST 15 07/08/2017   NA 138 07/08/2017   K 4.3 07/08/2017   CL 102 07/08/2017   CREATININE 1.06 07/08/2017   BUN 21 07/08/2017   CO2 30 07/08/2017   TSH 0.83 07/09/2016   INR 0.98 12/16/2014   HGBA1C 6.5 07/08/2017    Mr Brain Wo Contrast  Result Date: 12/16/2014 CLINICAL DATA:  Awoke with dizziness and nausea. Hypertension. Dizziness improved when lying down. EXAM: MRI HEAD WITHOUT CONTRAST TECHNIQUE: Multiplanar, multiecho pulse sequences of the brain and surrounding structures were obtained without intravenous contrast. COMPARISON:  None. FINDINGS: The diffusion-weighted images demonstrate no evidence for acute or subacute infarction. No hemorrhage or mass lesion is present. Mild generalized atrophy is present. Moderate periventricular and scattered subcortical T2 hyperintensities are greater than expected for age. There is relatively little extension of white matter disease into the posterior fossa. No significant extraaxial fluid collection is present. Flow is present in the major intracranial arteries. Globes and orbits are intact. The paranasal sinuses and mastoid air cells are clear. The skullbase is within normal limits. Midline structures are unremarkable. IMPRESSION: 1. No acute or focal lesion to explain the patient's symptoms. 2. Moderate age advanced  periventricular and subcortical white matter disease bilaterally. Electronically Signed   By: San Morelle M.D.   On: 12/16/2014 19:16    Assessment & Plan:   Christian [name] was seen today for depression, hypertension and hyperlipidemia.  Diagnoses and all orders for this visit:  Hyperglycemia- his A1c is up to 6.5%. He has new onset type 2 diabetes mellitus. This does not need to be treated with a medication. -     Comprehensive metabolic panel; Future -     Hemoglobin A1c; Future  Hyperlipidemia LDL goal <130- I've asked him to start taking a statin and aspirin for CV risk reduction. -     Lipid panel; Future -     Thyroid Panel With TSH; Future -     Pitavastatin Calcium (LIVALO) 2 MG TABS; Take 1 tablet (2 mg total) by mouth daily. -     aspirin EC 81 MG tablet; Take 1 tablet (81 mg total) by mouth daily.  Essential hypertension- his labs are negative for any secondary causes or end organ damage. I've asked him to start an ARB for blood pressure control. -     Comprehensive metabolic panel; Future -     CBC with Differential/Platelet; Future -     Thyroid Panel With TSH; Future -     telmisartan (MICARDIS) 40 MG tablet; Take  1 tablet (40 mg total) by mouth daily.  Depression with anxiety- will start escitalopram at 10 mg a day. I asked him to stay in touch with me over the next few weeks to consider a dose increase. -     escitalopram (LEXAPRO) 10 MG tablet; Take 1 tablet (10 mg total) by mouth daily.  Need for influenza vaccination -     Flu vaccine HIGH DOSE PF (Fluzone High dose)  Type 2 diabetes mellitus with complication, without long-term current use of insulin (HCC) -     telmisartan (MICARDIS) 40 MG tablet; Take 1 tablet (40 mg total) by mouth daily.   I have discontinued Christian [name]'s pregabalin. I am also having him start on escitalopram, Pitavastatin Calcium, aspirin EC, and telmisartan. Additionally, I am having him maintain his olopatadine, esomeprazole, and Vitamin  D.  Meds ordered this encounter  Medications  . escitalopram (LEXAPRO) 10 MG tablet    Sig: Take 1 tablet (10 mg total) by mouth daily.    Dispense:  90 tablet    Refill:  1  . Pitavastatin Calcium (LIVALO) 2 MG TABS    Sig: Take 1 tablet (2 mg total) by mouth daily.    Dispense:  90 tablet    Refill:  1  . aspirin EC 81 MG tablet    Sig: Take 1 tablet (81 mg total) by mouth daily.    Dispense:  90 tablet    Refill:  3  . telmisartan (MICARDIS) 40 MG tablet    Sig: Take 1 tablet (40 mg total) by mouth daily.    Dispense:  90 tablet    Refill:  1     Follow-up: Return in about 3 months (around 10/07/2017).  Scarlette Calico, MD

## 2017-07-08 NOTE — Patient Instructions (Signed)
Major Depressive Disorder, Adult Major depressive disorder (MDD) is a mental health condition. It may also be called clinical depression or unipolar depression. MDD usually causes feelings of sadness, hopelessness, or helplessness. MDD can also cause physical symptoms. It can interfere with work, school, relationships, and other everyday activities. MDD may be mild, moderate, or severe. It may occur once (single episode major depressive disorder) or it may occur multiple times (recurrent major depressive disorder). What are the causes? The exact cause of this condition is not known. MDD is most likely caused by a combination of things, which may include:  Genetic factors. These are traits that are passed along from parent to child.  Individual factors. Your personality, your behavior, and the way you handle your thoughts and feelings may contribute to MDD. This includes personality traits and behaviors learned from others.  Physical factors, such as: ? Differences in the part of your brain that controls emotion. This part of your brain may be different than it is in people who do not have MDD. ? Long-term (chronic) medical or psychiatric illnesses.  Social factors. Traumatic experiences or major life changes may play a role in the development of MDD.  What increases the risk? This condition is more likely to develop in women. The following factors may also make you more likely to develop MDD:  A family history of depression.  Troubled family relationships.  Abnormally low levels of certain brain chemicals.  Traumatic events in childhood, especially abuse or the loss of a parent.  Being under a lot of stress, or long-term stress, especially from upsetting life experiences or losses.  A history of: ? Chronic physical illness. ? Other mental health disorders. ? Substance abuse.  Poor living conditions.  Experiencing social exclusion or discrimination on a regular basis.  What are  the signs or symptoms? The main symptoms of MDD typically include:  Constant depressed or irritable mood.  Loss of interest in things and activities.  MDD symptoms may also include:  Sleeping or eating too much or too little.  Unexplained weight change.  Fatigue or low energy.  Feelings of worthlessness or guilt.  Difficulty thinking clearly or making decisions.  Thoughts of suicide or of harming others.  Physical agitation or weakness.  Isolation.  Severe cases of MDD may also occur with other symptoms, such as:  Delusions or hallucinations, in which you imagine things that are not real (psychotic depression).  Low-level depression that lasts at least a year (chronic depression or persistent depressive disorder).  Extreme sadness and hopelessness (melancholic depression).  Trouble speaking and moving (catatonic depression).  How is this diagnosed? This condition may be diagnosed based on:  Your symptoms.  Your medical history, including your mental health history. This may involve tests to evaluate your mental health. You may be asked questions about your lifestyle, including any drug and alcohol use, and how long you have had symptoms of MDD.  A physical exam.  Blood tests to rule out other conditions.  You must have a depressed mood and at least four other MDD symptoms most of the day, nearly every day in the same 2-week timeframe before your health care provider can confirm a diagnosis of MDD. How is this treated? This condition is usually treated by mental health professionals, such as psychologists, psychiatrists, and clinical social workers. You may need more than one type of treatment. Treatment may include:  Psychotherapy. This is also called talk therapy or counseling. Types of psychotherapy include: ? Cognitive behavioral   therapy (CBT). This type of therapy teaches you to recognize unhealthy feelings, thoughts, and behaviors, and replace them with  positive thoughts and actions. ? Interpersonal therapy (IPT). This helps you to improve the way you relate to and communicate with others. ? Family therapy. This treatment includes members of your family.  Medicine to treat anxiety and depression, or to help you control certain emotions and behaviors.  Lifestyle changes, such as: ? Limiting alcohol and drug use. ? Exercising regularly. ? Getting plenty of sleep. ? Making healthy eating choices. ? Spending more time outdoors.  Treatments involving stimulation of the brain can be used in situations with extremely severe symptoms, or when medicine or other therapies do not work over time. These treatments include electroconvulsive therapy, transcranial magnetic stimulation, and vagal nerve stimulation. Follow these instructions at home: Activity  Return to your normal activities as told by your health care provider.  Exercise regularly and spend time outdoors as told by your health care provider. General instructions  Take over-the-counter and prescription medicines only as told by your health care provider.  Do not drink alcohol. If you drink alcohol, limit your alcohol intake to no more than 1 drink a day for nonpregnant women and 2 drinks a day for men. One drink equals 12 oz of beer, 5 oz of wine, or 1 oz of hard liquor. Alcohol can affect any antidepressant medicines you are taking. Talk to your health care provider about your alcohol use.  Eat a healthy diet and get plenty of sleep.  Find activities that you enjoy doing, and make time to do them.  Consider joining a support group. Your health care provider may be able to recommend a support group.  Keep all follow-up visits as told by your health care provider. This is important. Where to find more information: National Alliance on Mental Illness  www.nami.org  U.[place]. National Institute of Mental Health  www.nimh.nih.gov  National Suicide Prevention  Lifeline  1-800-273-TALK (8255). This is free, 24-hour help.  Contact a health care provider if:  Your symptoms get worse.  You develop new symptoms. Get help right away if:  You self-harm.  You have serious thoughts about hurting yourself or others.  You see, hear, taste, smell, or feel things that are not present (hallucinate). This information is not intended to replace advice given to you by your health care provider. Make sure you discuss any questions you have with your health care provider. Document Released: 01/31/2013 Document Revised: 06/12/2016 Document Reviewed: 04/16/2016 Elsevier Interactive Patient Education  2017 [employer]vier Inc.  

## 2017-07-08 NOTE — Telephone Encounter (Signed)
Pt called in and said that he was allergic to Stains.  He can not take the Pitavastatin Calcium (LIVALO) 2 MG TABS [[id number]]  .  He said that he does not what to take Bp either.  He said that he was not going to pick it up and he was not taking med for his bp

## 2017-07-09 NOTE — Telephone Encounter (Signed)
ok 

## 2017-07-16 ENCOUNTER — Telehealth: Payer: Self-pay | Admitting: Internal Medicine

## 2017-07-16 NOTE — Telephone Encounter (Addendum)
Patient states that VIt D should be 1000 and not 2000 units.  Asking this to be changed in the system.   Also, gave patient MD response on labs.  Patient is requesting copy.  I will drop in the mail.  Patient also states his cholesterol has ran high for 75 years and he has had no problems.

## 2017-07-16 NOTE — Telephone Encounter (Signed)
Vit D changed.   Rest has been noted.

## 2017-08-31 ENCOUNTER — Ambulatory Visit: Payer: Medicare Other | Admitting: Neurology

## 2017-09-14 ENCOUNTER — Emergency Department (HOSPITAL_COMMUNITY): Payer: Medicare Other

## 2017-09-14 ENCOUNTER — Ambulatory Visit (INDEPENDENT_AMBULATORY_CARE_PROVIDER_SITE_OTHER): Payer: Medicare Other | Admitting: Internal Medicine

## 2017-09-14 ENCOUNTER — Observation Stay (HOSPITAL_COMMUNITY)
Admission: EM | Admit: 2017-09-14 | Discharge: 2017-09-17 | Disposition: A | Discharge status: Home / Self Care | Payer: Medicare Other | Attending: Family Medicine | Admitting: Family Medicine

## 2017-09-14 ENCOUNTER — Encounter: Payer: Self-pay | Admitting: Internal Medicine

## 2017-09-14 ENCOUNTER — Other Ambulatory Visit (INDEPENDENT_AMBULATORY_CARE_PROVIDER_SITE_OTHER): Payer: Medicare Other

## 2017-09-14 ENCOUNTER — Encounter (HOSPITAL_COMMUNITY): Payer: Self-pay

## 2017-09-14 ENCOUNTER — Telehealth: Payer: Self-pay

## 2017-09-14 VITALS — BP 150/96 | HR 103 | Temp 97.6°F | Ht 66.0 in | Wt 177.0 lb

## 2017-09-14 DIAGNOSIS — I2510 Atherosclerotic heart disease of native coronary artery without angina pectoris: Secondary | ICD-10-CM | POA: Insufficient documentation

## 2017-09-14 DIAGNOSIS — I110 Hypertensive heart disease with heart failure: Secondary | ICD-10-CM | POA: Diagnosis not present

## 2017-09-14 DIAGNOSIS — E1165 Type 2 diabetes mellitus with hyperglycemia: Secondary | ICD-10-CM | POA: Diagnosis not present

## 2017-09-14 DIAGNOSIS — I340 Nonrheumatic mitral (valve) insufficiency: Secondary | ICD-10-CM | POA: Insufficient documentation

## 2017-09-14 DIAGNOSIS — Z7982 Long term (current) use of aspirin: Secondary | ICD-10-CM | POA: Insufficient documentation

## 2017-09-14 DIAGNOSIS — G7249 Other inflammatory and immune myopathies, not elsewhere classified: Secondary | ICD-10-CM | POA: Diagnosis not present

## 2017-09-14 DIAGNOSIS — Z79899 Other long term (current) drug therapy: Secondary | ICD-10-CM | POA: Diagnosis not present

## 2017-09-14 DIAGNOSIS — R079 Chest pain, unspecified: Secondary | ICD-10-CM

## 2017-09-14 DIAGNOSIS — Z880 Allergy status to penicillin: Secondary | ICD-10-CM | POA: Diagnosis not present

## 2017-09-14 DIAGNOSIS — E1140 Type 2 diabetes mellitus with diabetic neuropathy, unspecified: Secondary | ICD-10-CM | POA: Diagnosis not present

## 2017-09-14 DIAGNOSIS — F418 Other specified anxiety disorders: Secondary | ICD-10-CM | POA: Diagnosis not present

## 2017-09-14 DIAGNOSIS — I429 Cardiomyopathy, unspecified: Secondary | ICD-10-CM | POA: Diagnosis not present

## 2017-09-14 DIAGNOSIS — I700 Atherosclerosis of aorta: Secondary | ICD-10-CM | POA: Diagnosis not present

## 2017-09-14 DIAGNOSIS — I272 Pulmonary hypertension, unspecified: Secondary | ICD-10-CM | POA: Insufficient documentation

## 2017-09-14 DIAGNOSIS — I5023 Acute on chronic systolic (congestive) heart failure: Secondary | ICD-10-CM | POA: Diagnosis not present

## 2017-09-14 DIAGNOSIS — I10 Essential (primary) hypertension: Secondary | ICD-10-CM | POA: Diagnosis present

## 2017-09-14 DIAGNOSIS — R0602 Shortness of breath: Secondary | ICD-10-CM

## 2017-09-14 DIAGNOSIS — R000 Tachycardia, unspecified: Secondary | ICD-10-CM | POA: Diagnosis not present

## 2017-09-14 DIAGNOSIS — E118 Type 2 diabetes mellitus with unspecified complications: Secondary | ICD-10-CM | POA: Diagnosis present

## 2017-09-14 DIAGNOSIS — M791 Myalgia, unspecified site: Secondary | ICD-10-CM | POA: Insufficient documentation

## 2017-09-14 DIAGNOSIS — E785 Hyperlipidemia, unspecified: Secondary | ICD-10-CM | POA: Diagnosis present

## 2017-09-14 DIAGNOSIS — Z882 Allergy status to sulfonamides status: Secondary | ICD-10-CM | POA: Insufficient documentation

## 2017-09-14 DIAGNOSIS — R0789 Other chest pain: Secondary | ICD-10-CM | POA: Diagnosis not present

## 2017-09-14 DIAGNOSIS — E1169 Type 2 diabetes mellitus with other specified complication: Secondary | ICD-10-CM | POA: Diagnosis present

## 2017-09-14 DIAGNOSIS — I5022 Chronic systolic (congestive) heart failure: Secondary | ICD-10-CM

## 2017-09-14 DIAGNOSIS — R0609 Other forms of dyspnea: Secondary | ICD-10-CM

## 2017-09-14 LAB — CBC WITH DIFFERENTIAL/PLATELET
BASOS PCT: 0.3 % (ref 0.0–3.0)
Basophils Absolute: 0 10*3/uL (ref 0.0–0.1)
EOS ABS: 0 10*3/uL (ref 0.0–0.7)
Eosinophils Relative: 0.1 % (ref 0.0–5.0)
HCT: 46.5 % (ref 39.0–52.0)
HEMOGLOBIN: 15.2 g/dL (ref 13.0–17.0)
Lymphocytes Relative: 6.3 % — ABNORMAL LOW (ref 12.0–46.0)
Lymphs Abs: 0.6 10*3/uL — ABNORMAL LOW (ref 0.7–4.0)
MCHC: 32.6 g/dL (ref 30.0–36.0)
MCV: 89.6 fl (ref 78.0–100.0)
MONO ABS: 0.4 10*3/uL (ref 0.1–1.0)
Monocytes Relative: 3.9 % (ref 3.0–12.0)
Neutro Abs: 9.1 10*3/uL — ABNORMAL HIGH (ref 1.4–7.7)
Neutrophils Relative %: 89.4 % — ABNORMAL HIGH (ref 43.0–77.0)
Platelets: 213 10*3/uL (ref 150.0–400.0)
RBC: 5.19 Mil/uL (ref 4.22–5.81)
RDW: 14.9 % (ref 11.5–15.5)
WBC: 10.2 10*3/uL (ref 4.0–10.5)

## 2017-09-14 LAB — I-STAT TROPONIN, ED: TROPONIN I, POC: 0.04 ng/mL (ref 0.00–0.08)

## 2017-09-14 LAB — BASIC METABOLIC PANEL
ANION GAP: 11 (ref 5–15)
BUN: 23 mg/dL — ABNORMAL HIGH (ref 6–20)
CALCIUM: 8.9 mg/dL (ref 8.9–10.3)
CO2: 24 mmol/L (ref 22–32)
Chloride: 102 mmol/L (ref 101–111)
Creatinine, Ser: 1.23 mg/dL (ref 0.61–1.24)
GFR, EST NON AFRICAN AMERICAN: 55 mL/min — AB (ref >60)
Glucose, Bld: 157 mg/dL — ABNORMAL HIGH (ref 65–99)
Potassium: 4.3 mmol/L (ref 3.5–5.1)
Sodium: 137 mmol/L (ref 135–145)

## 2017-09-14 LAB — CBC
HCT: 46.7 % (ref 39.0–52.0)
HEMOGLOBIN: 14.9 g/dL (ref 13.0–17.0)
MCH: 29.1 pg (ref 26.0–34.0)
MCHC: 31.9 g/dL (ref 30.0–36.0)
MCV: 91.2 fL (ref 78.0–100.0)
Platelets: 197 10*3/uL (ref 150–400)
RBC: 5.12 MIL/uL (ref 4.22–5.81)
RDW: 15.2 % (ref 11.5–15.5)
WBC: 8.6 10*3/uL (ref 4.0–10.5)

## 2017-09-14 LAB — BRAIN NATRIURETIC PEPTIDE: PRO B NATRI PEPTIDE: 992 pg/mL — AB (ref 0.0–100.0)

## 2017-09-14 LAB — TROPONIN I: TNIDX: 0.07 ug/L — AB (ref 0.00–0.06)

## 2017-09-14 MED ORDER — CLONAZEPAM 1 MG PO TABS: 1.0000 mg | ORAL_TABLET | Freq: Two times a day (BID) | ORAL | 1 refills | Status: DC | PRN

## 2017-09-14 NOTE — ED Triage Notes (Signed)
Per Pt, Pt is coming from Cardiology where he was sent to be evaluated due to EKG changes and elevated Troponin. Denies CP, but reports some SOB. Some nausea, but no vomiting.

## 2017-09-14 NOTE — Telephone Encounter (Signed)
CRITICAL VALUE STICKER  CRITICAL VALUE: troponin is 0.07  RECEIVER (on-site recipient of call): [place] NOTIFIED: 3:18  MESSENGER (representative from lab):  MD NOTIFIED: Ronnald Ramp  TIME OF NOTIFICATION: 3:19  RESPONSE:

## 2017-09-14 NOTE — ED Notes (Signed)
Pt to [facility] once again....... States "you told me 3 people ahead of me an hour ago and I've seen more than that go back" explained to the pt the process that ppl are going back for triage/fast track.

## 2017-09-14 NOTE — Patient Instructions (Signed)
Panic Attacks Panic attacks are sudden, short-livedsurges of severe anxiety, fear, or discomfort. They may occur for no reason when you are relaxed, when you are anxious, or when you are sleeping. Panic attacks may occur for a number of reasons:  Healthy people occasionally have panic attacks in extreme, life-threatening situations, such as war or natural disasters. Normal anxiety is a protective mechanism of the body that helps us react to danger (fight or flight response).  Panic attacks are often seen with anxiety disorders, such as panic disorder, social anxiety disorder, generalized anxiety disorder, and phobias. Anxiety disorders cause excessive or uncontrollable anxiety. They may interfere with your relationships or other life activities.  Panic attacks are sometimes seen with other mental illnesses, such as depression and posttraumatic stress disorder.  Certain medical conditions, prescription medicines, and drugs of abuse can cause panic attacks. What are the signs or symptoms? Panic attacks start suddenly, peak within 20 minutes, and are accompanied by four or more of the following symptoms:  Pounding heart or fast heart rate (palpitations).  Sweating.  Trembling or shaking.  Shortness of breath or feeling smothered.  Feeling choked.  Chest pain or discomfort.  Nausea or strange feeling in your stomach.  Dizziness, light-headedness, or feeling like you will faint.  Chills or hot flushes.  Numbness or tingling in your lips or hands and feet.  Feeling that things are not real or feeling that you are not yourself.  Fear of losing control or going crazy.  Fear of dying. Some of these symptoms can mimic serious medical conditions. For example, you may think you are having a heart attack. Although panic attacks can be very scary, they are not life threatening. How is this diagnosed? Panic attacks are diagnosed through an assessment by your health care provider. Your  health care provider will ask questions about your symptoms, such as where and when they occurred. Your health care provider will also ask about your medical history and use of alcohol and drugs, including prescription medicines. Your health care provider may order blood tests or other studies to rule out a serious medical condition. Your health care provider may refer you to a mental health professional for further evaluation. How is this treated?  Most healthy people who have one or two panic attacks in an extreme, life-threatening situation will not require treatment.  The treatment for panic attacks associated with anxiety disorders or other mental illness typically involves counseling with a mental health professional, medicine, or a combination of both. Your health care provider will help determine what treatment is best for you.  Panic attacks due to physical illness usually go away with treatment of the illness. If prescription medicine is causing panic attacks, talk with your health care provider about stopping the medicine, decreasing the dose, or substituting another medicine.  Panic attacks due to alcohol or drug abuse go away with abstinence. Some adults need professional help in order to stop drinking or using drugs. Follow these instructions at home:  Take all medicines as directed by your health care provider.  Schedule and attend follow-up visits as directed by your health care provider. It is important to keep all your appointments. Contact a health care provider if:  You are not able to take your medicines as prescribed.  Your symptoms do not improve or get worse. Get help right away if:  You experience panic attack symptoms that are different than your usual symptoms.  You have serious thoughts about hurting yourself or others.    You are taking medicine for panic attacks and have a serious side effect. This information is not intended to replace advice given to you by  your health care provider. Make sure you discuss any questions you have with your health care provider. Document Released: 10/06/2005 Document Revised: 03/13/2016 Document Reviewed: 05/20/2013 Elsevier Interactive Patient Education  2017 [employer]  

## 2017-09-14 NOTE — Progress Notes (Signed)
Subjective:  Patient ID: Christian [name], male    DOB: January 12, 1941  Age: 76 y.o. MRN: [id number]  CC: Chest Pain   HPI Christian [name] presents for a several week history of chest pain and shortness of breath at rest.  He thinks his symptoms are caused by anxiety.  He was previously prescribed Lexapro but is not taking it.  He describes the chest pain as a tightness but he does not think it is exacerbated by activity.  He denies cough, diaphoresis, or fatigue.  Outpatient Medications Prior to Visit  Medication Sig Dispense Refill  . aspirin EC 81 MG tablet Take 1 tablet (81 mg total) by mouth daily. 90 tablet 3  . cholecalciferol (VITAMIN D) 1000 units tablet Take 1,000 Units by mouth daily.    Marland Kitchen esomeprazole (NEXIUM) 40 MG capsule Take 40 mg by mouth daily as needed (acid reflux).    Marland Kitchen olopatadine (PATANOL) 0.1 % ophthalmic solution Place 1 drop into both eyes daily.    . Cholecalciferol (VITAMIN D) 2000 UNITS tablet Take 1,000 Units by mouth daily.     Marland Kitchen escitalopram (LEXAPRO) 10 MG tablet Take 1 tablet (10 mg total) by mouth daily. 90 tablet 1  . Pitavastatin Calcium (LIVALO) 2 MG TABS Take 1 tablet (2 mg total) by mouth daily. 90 tablet 1  . telmisartan (MICARDIS) 40 MG tablet Take 1 tablet (40 mg total) by mouth daily. 90 tablet 1   No facility-administered medications prior to visit.     ROS Review of Systems  Constitutional: Negative.  Negative for appetite change, diaphoresis, fatigue and unexpected weight change.  HENT: Negative.   Respiratory: Positive for shortness of breath.   Cardiovascular: Positive for chest pain. Negative for palpitations and leg swelling.  Gastrointestinal: Negative.  Negative for abdominal pain, constipation, diarrhea, nausea and vomiting.  Endocrine: Negative.   Genitourinary: Negative.  Negative for difficulty urinating and dysuria.  Musculoskeletal: Negative.  Negative for arthralgias.  Skin: Negative.   Neurological: Positive for dizziness.  Negative for weakness, light-headedness, numbness and headaches.  Hematological: Negative.  Negative for adenopathy. Does not bruise/bleed easily.  Psychiatric/Behavioral: Positive for dysphoric mood. The patient is nervous/anxious.     Objective:  BP (!) 150/96 (BP Location: Left Arm, Patient Position: Sitting, Cuff Size: Normal)   Pulse (!) 103   Temp 97.6 F (36.4 C) (Oral)   Ht 5\' 6"  (1.676 m)   Wt 177 lb (80.3 kg)   SpO2 97%   BMI 28.57 kg/m   BP Readings from Last 3 Encounters:  09/14/17 (!) 123/92  09/14/17 (!) 150/96  07/08/17 140/90    Wt Readings from Last 3 Encounters:  09/14/17 177 lb (80.3 kg)  09/14/17 177 lb (80.3 kg)  07/08/17 180 lb (81.6 kg)    Physical Exam  Constitutional: He is oriented to person, place, and time.  Non-toxic appearance. He does not have a sickly appearance. He does not appear ill. He appears distressed.  HENT:  Mouth/Throat: Oropharynx is clear and moist. No oropharyngeal exudate.  Eyes: Conjunctivae are normal.  Neck: Normal range of motion. Neck supple. No JVD present. No thyromegaly present.  Cardiovascular: Normal rate, regular rhythm, S1 normal, S2 normal and normal heart sounds.  Occasional extrasystoles are present. Exam reveals no gallop and no friction rub.  No murmur heard. EKG--  Sinus  Rhythm  -with ectopic ventricular couplets  -Left atrial enlargement.   -  Diffuse nonspecific T-abnormality.   ABNORMAL -T wave changes are more prominent compared to  the prior EKG.  Pulmonary/Chest: Effort normal and breath sounds normal. Tachypnea noted. No respiratory distress. He has no decreased breath sounds. He has no wheezes. He has no rhonchi. He has no rales. He exhibits no tenderness.  Abdominal: Soft. Bowel sounds are normal. He exhibits no distension and no mass. There is no tenderness. There is no rebound and no guarding.  Musculoskeletal: Normal range of motion. He exhibits no edema, tenderness or deformity.    Lymphadenopathy:    He has no cervical adenopathy.  Neurological: He is alert and oriented to person, place, and time.  Skin: Skin is warm and dry. No rash noted. He is not diaphoretic. No erythema. No pallor.  Psychiatric: His mood appears anxious. He exhibits a depressed mood. He expresses no homicidal and no suicidal ideation. He expresses no suicidal plans and no homicidal plans.  +tearful  Vitals reviewed.   Lab Results  Component Value Date   WBC 8.6 09/14/2017   HGB 14.9 09/14/2017   HCT 46.7 09/14/2017   PLT 197 09/14/2017   GLUCOSE 142 (H) 07/08/2017   CHOL 258 (H) 07/08/2017   TRIG 109.0 07/08/2017   HDL 68.70 07/08/2017   LDLCALC 167 (H) 07/08/2017   ALT 16 07/08/2017   AST 15 07/08/2017   NA 138 07/08/2017   K 4.3 07/08/2017   CL 102 07/08/2017   CREATININE 1.06 07/08/2017   BUN 21 07/08/2017   CO2 30 07/08/2017   TSH 1.22 07/08/2017   INR 0.98 12/16/2014   HGBA1C 6.5 07/08/2017    Mr Brain Wo Contrast  Result Date: 12/16/2014 CLINICAL DATA:  Awoke with dizziness and nausea. Hypertension. Dizziness improved when lying down. EXAM: MRI HEAD WITHOUT CONTRAST TECHNIQUE: Multiplanar, multiecho pulse sequences of the brain and surrounding structures were obtained without intravenous contrast. COMPARISON:  None. FINDINGS: The diffusion-weighted images demonstrate no evidence for acute or subacute infarction. No hemorrhage or mass lesion is present. Mild generalized atrophy is present. Moderate periventricular and scattered subcortical T2 hyperintensities are greater than expected for age. There is relatively little extension of white matter disease into the posterior fossa. No significant extraaxial fluid collection is present. Flow is present in the major intracranial arteries. Globes and orbits are intact. The paranasal sinuses and mastoid air cells are clear. The skullbase is within normal limits. Midline structures are unremarkable. IMPRESSION: 1. No acute or focal  lesion to explain the patient's symptoms. 2. Moderate age advanced periventricular and subcortical white matter disease bilaterally. Electronically Signed   By: San Morelle M.D.   On: 12/16/2014 19:16    Assessment & Plan:   Rishaan was seen today for chest pain.  Diagnoses and all orders for this visit:  Chest pain, unspecified type- Chest pain is atypical but he is high risk and has subtle EKG changes in the T waves, a very slightly elevated troponin, and a significantly elevated BNP.  I have spoken to him about these abnormal results and have informed him of my concern that he might be having an acute coronary syndrome or congestive heart failure.  He has agreed to be seen at [facility] for further evaluation and treatment. -     EKG 12-Lead -     CBC with Differential/Platelet; Future -     Troponin I; Future -     D-dimer, quantitative (not at [facility]); Future -     Brain natriuretic peptide; Future  SOB (shortness of breath)- as above -     EKG 12-Lead -  CBC with Differential/Platelet; Future -     Troponin I; Future -     D-dimer, quantitative (not at [facility]); Future -     Brain natriuretic peptide; Future  Depression with anxiety -     clonazePAM (KLONOPIN) 1 MG tablet; Take 1 tablet (1 mg total) by mouth 2 (two) times daily as needed for anxiety.   I have discontinued Martin Lafon's Vitamin D, escitalopram, Pitavastatin Calcium, and telmisartan. I am also having him start on clonazePAM. Additionally, I am having him maintain his olopatadine, esomeprazole, aspirin EC, and cholecalciferol.  Meds ordered this encounter  Medications  . clonazePAM (KLONOPIN) 1 MG tablet    Sig: Take 1 tablet (1 mg total) by mouth 2 (two) times daily as needed for anxiety.    Dispense:  60 tablet    Refill:  1     Follow-up: Return in about 3 weeks (around 10/05/2017).  Scarlette Calico, MD

## 2017-09-14 NOTE — ED Notes (Signed)
Pt up to NF requesting how much longer, he states "I've been here since 2:30 what the heck is going on" Informed pt he has been here since 4:07 not 2:30 and informed him of longest wait time.

## 2017-09-14 NOTE — ED Notes (Signed)
Pt to NF again stating "I am here bc I thought I was having a heart attack, you better hope I'm not having one"

## 2017-09-14 NOTE — ED Notes (Signed)
Pt did not respond for vitals to be checked at 21:27.

## 2017-09-15 ENCOUNTER — Other Ambulatory Visit: Payer: Self-pay

## 2017-09-15 ENCOUNTER — Ambulatory Visit (HOSPITAL_BASED_OUTPATIENT_CLINIC_OR_DEPARTMENT_OTHER): Payer: Medicare Other

## 2017-09-15 ENCOUNTER — Emergency Department (HOSPITAL_COMMUNITY): Payer: Medicare Other

## 2017-09-15 DIAGNOSIS — R079 Chest pain, unspecified: Secondary | ICD-10-CM

## 2017-09-15 DIAGNOSIS — E785 Hyperlipidemia, unspecified: Secondary | ICD-10-CM | POA: Diagnosis not present

## 2017-09-15 DIAGNOSIS — I5023 Acute on chronic systolic (congestive) heart failure: Secondary | ICD-10-CM | POA: Diagnosis not present

## 2017-09-15 DIAGNOSIS — I340 Nonrheumatic mitral (valve) insufficiency: Secondary | ICD-10-CM | POA: Diagnosis not present

## 2017-09-15 DIAGNOSIS — I10 Essential (primary) hypertension: Secondary | ICD-10-CM

## 2017-09-15 DIAGNOSIS — R0602 Shortness of breath: Secondary | ICD-10-CM | POA: Diagnosis not present

## 2017-09-15 DIAGNOSIS — I2510 Atherosclerotic heart disease of native coronary artery without angina pectoris: Secondary | ICD-10-CM | POA: Diagnosis not present

## 2017-09-15 DIAGNOSIS — I5021 Acute systolic (congestive) heart failure: Secondary | ICD-10-CM | POA: Diagnosis not present

## 2017-09-15 DIAGNOSIS — E1140 Type 2 diabetes mellitus with diabetic neuropathy, unspecified: Secondary | ICD-10-CM | POA: Diagnosis not present

## 2017-09-15 DIAGNOSIS — Z79899 Other long term (current) drug therapy: Secondary | ICD-10-CM | POA: Diagnosis not present

## 2017-09-15 DIAGNOSIS — I700 Atherosclerosis of aorta: Secondary | ICD-10-CM | POA: Diagnosis not present

## 2017-09-15 DIAGNOSIS — I272 Pulmonary hypertension, unspecified: Secondary | ICD-10-CM | POA: Diagnosis not present

## 2017-09-15 LAB — TROPONIN I
TROPONIN I: 0.1 ng/mL — AB (ref <0.03)
Troponin I: 0.09 ng/mL (ref <0.03)

## 2017-09-15 LAB — ECHOCARDIOGRAM COMPLETE
Height: 60.5 in
WEIGHTICAEL: 2772.8 [oz_av]

## 2017-09-15 LAB — I-STAT TROPONIN, ED: Troponin i, poc: 0.04 ng/mL (ref 0.00–0.08)

## 2017-09-15 LAB — D-DIMER, QUANTITATIVE: D-Dimer, Quant: 0.82 mcg/mL FEU — ABNORMAL HIGH (ref <0.50)

## 2017-09-15 LAB — D-DIMER, QUANTITATIVE (NOT AT ARMC): D DIMER QUANT: 0.87 ug{FEU}/mL — AB (ref 0.00–0.50)

## 2017-09-15 MED ORDER — IOPAMIDOL (ISOVUE-370) INJECTION 76%
INTRAVENOUS | Status: AC
  Administered 2017-09-15: 100 mL
  Filled 2017-09-15: qty 100

## 2017-09-15 MED ORDER — OLOPATADINE HCL 0.1 % OP SOLN
1.0000 [drp] | Freq: Every day | OPHTHALMIC | Status: DC
  Administered 2017-09-16 – 2017-09-17 (×2): 1 [drp] via OPHTHALMIC
  Filled 2017-09-15: qty 5

## 2017-09-15 MED ORDER — CLONAZEPAM 0.5 MG PO TABS: 1.0000 mg | ORAL_TABLET | Freq: Two times a day (BID) | ORAL | Status: DC | PRN

## 2017-09-15 MED ORDER — VITAMIN D 1000 UNITS PO TABS
1000.0000 [IU] | ORAL_TABLET | Freq: Every day | ORAL | Status: DC
  Administered 2017-09-15 – 2017-09-17 (×3): 1000 [IU] via ORAL
  Filled 2017-09-15 (×3): qty 1

## 2017-09-15 MED ORDER — ASPIRIN EC 81 MG PO TBEC
81.0000 mg | DELAYED_RELEASE_TABLET | Freq: Every day | ORAL | Status: DC
  Administered 2017-09-15 – 2017-09-17 (×2): 81 mg via ORAL
  Filled 2017-09-15 (×2): qty 1

## 2017-09-15 MED ORDER — METOPROLOL TARTRATE 25 MG PO TABS
25.0000 mg | ORAL_TABLET | Freq: Once | ORAL | Status: AC
  Administered 2017-09-15: 25 mg via ORAL
  Filled 2017-09-15: qty 1

## 2017-09-15 MED ORDER — ENOXAPARIN SODIUM 40 MG/0.4ML ~~LOC~~ SOLN
40.0000 mg | Freq: Every day | SUBCUTANEOUS | Status: DC
  Filled 2017-09-15 (×2): qty 0.4

## 2017-09-15 MED ORDER — PANTOPRAZOLE SODIUM 40 MG PO TBEC
40.0000 mg | DELAYED_RELEASE_TABLET | Freq: Once | ORAL | Status: AC
  Administered 2017-09-15: 40 mg via ORAL
  Filled 2017-09-15: qty 1

## 2017-09-15 MED ORDER — ACETAMINOPHEN 325 MG PO TABS: 650.0000 mg | ORAL_TABLET | ORAL | Status: DC | PRN

## 2017-09-15 MED ORDER — PANTOPRAZOLE SODIUM 40 MG PO TBEC
80.0000 mg | DELAYED_RELEASE_TABLET | Freq: Every day | ORAL | Status: DC
  Administered 2017-09-15 – 2017-09-17 (×3): 80 mg via ORAL
  Filled 2017-09-15 (×3): qty 2

## 2017-09-15 MED ORDER — ALUM & MAG HYDROXIDE-SIMETH 200-200-20 MG/5ML PO SUSP
30.0000 mL | Freq: Four times a day (QID) | ORAL | Status: DC | PRN
  Administered 2017-09-15: 30 mL via ORAL
  Filled 2017-09-15: qty 30

## 2017-09-15 MED ORDER — ONDANSETRON HCL 4 MG/2ML IJ SOLN
4.0000 mg | Freq: Four times a day (QID) | INTRAMUSCULAR | Status: DC | PRN
  Administered 2017-09-17: 4 mg via INTRAVENOUS
  Filled 2017-09-15: qty 2

## 2017-09-15 NOTE — Progress Notes (Signed)
Bath offered pt stated that he will take it later on tonight

## 2017-09-15 NOTE — ED Notes (Signed)
Patient transported to CT 

## 2017-09-15 NOTE — ED Provider Notes (Signed)
Medical screening examination/treatment/procedure(s) were conducted as a shared visit with non-physician practitioner(s) and myself.  I personally evaluated the patient during the encounter.   EKG Interpretation  Date/Time:  Monday September 14 2017 16:12:10 EST Ventricular Rate:  103 PR Interval:  158 QRS Duration: 88 QT Interval:  370 QTC Calculation: 484 R Axis:   0 Text Interpretation:  Sinus tachycardia with occasional Premature ventricular complexes Septal infarct , age undetermined Abnormal ECG When compared with ECG of 12/16/2014, No significant change was found Confirmed by Delora Fuel ([id number]) on 09/15/2017 12:01:37 AM      Patient is a 76 year old male with history of hypertension, diabetes, hyperlipidemia who presents to the emergency department with several weeks of progressively worsening diffuse anterior chest tightness and shortness of breath worse with exertion.  No chest tightness of breath intermittently.  Found to have a slightly positive troponin with his primary care physician today and T wave inversions.  No significant change seen on EKG here but he is slightly tachycardic with occasional PVCs.  Troponin here is negative.  Chest x-ray clear.  D-dimer positive.  CTA shows no acute abnormality.  No pulmonary embolus.  Repeat troponin is negative.  Will admit for chest pain rule out.   Makai Agostinelli, Delice Bison, DO 09/15/17 [phone]

## 2017-09-15 NOTE — Consult Note (Signed)
Cardiology Consultation:   Patient ID: Christian [name]; [id number]; 1941/03/02   Admit date: 09/14/2017 Date of Consult: 09/15/2017  Primary Care Provider: Janith Lima, MD Primary Cardiologist: new - Dr. Acie [name] Primary Electrophysiologist:     Patient Profile:   Christian [name] is a 76 y.o. male with a hx of HLD, HTN, DM, neuropathy, SOB, idiopathic inflammatory myopathy, and abdominal hernia who is being seen today for the evaluation of chest pain at the request of Dr. Maylene [name].  History of Present Illness:   Christian [name] does not currently follow with cardiology. According to EDP note, he presented to [facility] with new onset shortness of breath and chest tightness. He stated that  he had chest tightness intermittently for 6 weeks and episodes of shortness of breath that are worse on exertion, eating, or feeling anxious.   On my interview, he denies chest tightness, chest pain, arm pain/tingling, and back pain. He only describes episodes of shortness of breath that he says are associated with anxiety. He has had 6 weeks of intermittent episodes of shortness of breath that occur with rest, activity, and eating. He states its his anxiety and he treats these episodes with prednisone. He generally takes 10 mg prednisone when the SOB occurs, but sometimes has to take more. He denies palpitations, dizziness, lightheadedness, leg swelling, and syncope.    Past Medical History:  Diagnosis Date  . Hernia of abdominal cavity   . Hyperlipidemia     Past Surgical History:  Procedure Laterality Date  . BACK SURGERY    . HERNIA REPAIR    . SPINE SURGERY       Home Medications:  Prior to Admission medications   Medication Sig Start Date End Date Taking? Authorizing Provider  aspirin EC 81 MG tablet Take 1 tablet (81 mg total) by mouth daily. 07/08/17  Yes Christian Lima, MD  cholecalciferol (VITAMIN D) 1000 units tablet Take 1,000 Units by mouth daily.   Yes [provider]  clonazePAM  (KLONOPIN) 1 MG tablet Take 1 tablet (1 mg total) by mouth 2 (two) times daily as needed for anxiety. 09/14/17  Yes Christian Lima, MD  esomeprazole (NEXIUM) 40 MG capsule Take 40 mg by mouth daily as needed (acid reflux).   Yes [provider]  olopatadine (PATANOL) 0.1 % ophthalmic solution Place 1 drop into both eyes daily.   Yes [provider]    Inpatient Medications: Scheduled Meds: . aspirin EC  81 mg Oral Daily  . cholecalciferol  1,000 Units Oral Daily  . enoxaparin (LOVENOX) injection  40 mg Subcutaneous Daily  . olopatadine  1 drop Both Eyes Daily  . pantoprazole  80 mg Oral Q1200   Continuous Infusions:  PRN Meds: acetaminophen, alum & mag hydroxide-simeth, clonazePAM, ondansetron (ZOFRAN) IV  Allergies:    Allergies  Allergen Reactions  . Amoxicillin Hives  . Bactrim [Sulfamethoxazole-Trimethoprim] Other (See Comments)    Dried out his eyes  . Cephalexin Itching  . Hydrocodone Itching  . Lexapro [Escitalopram Oxalate] Other (See Comments)    disorientation  . Lipitor [Atorvastatin] Other (See Comments)    Damage to calf muscles  . Oxycodone Itching  . Statins Other (See Comments)    Muscle pain   . Vytorin [Ezetimibe-Simvastatin] Other (See Comments)    Damage to calf muscles    Social History:   Social History   Socioeconomic History  . Marital status: Single    Spouse name: Not on file  . Number of children: Not  on file  . Years of education: Not on file  . Highest education level: Not on file  Social Needs  . Financial resource strain: Not on file  . Food insecurity - worry: Not on file  . Food insecurity - inability: Not on file  . Transportation needs - medical: Not on file  . Transportation needs - non-medical: Not on file  Occupational History  . Not on file  Tobacco Use  . Smoking status: Never Smoker  . Smokeless tobacco: Never Used  Substance and Sexual Activity  . Alcohol use: No  . Drug use: No  . Sexual  activity: Not Currently  Other Topics Concern  . Not on file  Social History Narrative  . Not on file    Family History:    Family History  Problem Relation Age of Onset  . Stroke Mother   . Kidney disease Father   . Stroke Father   . Hypertension Father   . Early death Father   . Cancer Neg Hx   . Alcohol abuse Neg Hx   . Diabetes Neg Hx   . Heart disease Neg Hx   . Hyperlipidemia Neg Hx      ROS:  Please see the history of present illness.  ROS  All other ROS reviewed and negative.     Physical Exam/Data:   Vitals:   09/15/17 0354 09/15/17 0400 09/15/17 0446 09/15/17 0842  BP:   (!) 144/92 125/85  Pulse: (!) 51 (!) 105 (!) 108 (!) 58  Resp: (!) 21 19 20 18   Temp:   98.2 F (36.8 C)   TempSrc:   Oral   SpO2: 99% 97% 98% 97%  Weight:   173 lb 4.8 oz (78.6 kg)   Height:   5' 0.5" (1.537 m)     Intake/Output Summary (Last 24 hours) at 09/15/2017 1123 Last data filed at 09/15/2017 0840 Gross per 24 hour  Intake 0 ml  Output 150 ml  Net -150 ml   Filed Weights   09/14/17 1622 09/15/17 0446  Weight: 177 lb (80.3 kg) 173 lb 4.8 oz (78.6 kg)   Body mass index is 33.29 kg/m.  General:  Well nourished, well developed, in no acute distress HEENT: normal Neck: mild JVD Vascular: No carotid bruits; FA pulses 2+ bilaterally without bruits  Cardiac:  normal S1, S2; RRR; no murmur Lungs:  clear to auscultation bilaterally, no wheezing, rhonchi or rales  Abd: soft, nontender, no hepatomegaly  Ext: no edema Musculoskeletal:  No deformities, BUE and BLE strength normal and equal Skin: warm and dry  Neuro:  CNs 2-12 intact, no focal abnormalities noted Psych:  Normal affect   EKG:  The EKG was personally reviewed and demonstrates:  Sinus with PACs Telemetry:  Telemetry was personally reviewed and demonstrates:  Sinus   Relevant CV Studies:  Echo: pending  Laboratory Data:  Chemistry Recent Labs  Lab 09/14/17 1620  NA 137  K 4.3  CL 102  CO2 24    GLUCOSE 157*  BUN 23*  CREATININE 1.23  CALCIUM 8.9  GFRNONAA 55*  GFRAA >60  ANIONGAP 11    No results for input(s): PROT, ALBUMIN, AST, ALT, ALKPHOS, BILITOT in the last 168 hours. Hematology Recent Labs  Lab 09/14/17 1431 09/14/17 1620  WBC 10.2 8.6  RBC 5.19 5.12  HGB 15.2 14.9  HCT 46.5 46.7  MCV 89.6 91.2  MCH  --  29.1  MCHC 32.6 31.9  RDW 14.9 15.2  PLT 213.0  197   Cardiac Enzymes Recent Labs  Lab 09/15/17 0405 09/15/17 0706  TROPONINI 0.09* 0.10*    Recent Labs  Lab 09/14/17 1656 09/15/17 0035  TROPIPOC 0.04 0.04    BNP Recent Labs  Lab 09/14/17 1431  PROBNP 992.0*    DDimer  Recent Labs  Lab 09/14/17 1431 09/14/17 1630  DDIMER 0.82* 0.87*    Radiology/Studies:  Dg Chest 2 View  Result Date: 09/14/2017 CLINICAL DATA:  Short of breath nausea for 6 weeks EXAM: CHEST  2 VIEW COMPARISON:  None. FINDINGS: Borderline cardiomegaly. Normal vascularity. Low lung volumes. Bibasilar atelectasis. No pneumothorax or pleural effusion. IMPRESSION: Bibasilar airspace disease. Electronically Signed   By: Marybelle Killings M.D.   On: 09/14/2017 17:54   Ct Angio Chest Pe W And/or Wo Contrast  Result Date: 09/15/2017 CLINICAL DATA:  Shortness of breath EXAM: CT ANGIOGRAPHY CHEST WITH CONTRAST TECHNIQUE: Multidetector CT imaging of the chest was performed using the standard protocol during bolus administration of intravenous contrast. Multiplanar CT image reconstructions and MIPs were obtained to evaluate the vascular anatomy. CONTRAST:  195mL ISOVUE-370 IOPAMIDOL (ISOVUE-370) INJECTION 76% COMPARISON:  Chest radiograph 09/14/2017 FINDINGS: Cardiovascular: Contrast injection is sufficient to demonstrate satisfactory opacification of the pulmonary arteries to the segmental level. There is no pulmonary embolus. The main pulmonary artery is within normal limits for size. There is no CT evidence of acute right heart strain. Mild calcific aortic atherosclerosis. There is a  normal 3-vessel arch branching pattern. Heart size is normal, without pericardial effusion. Mediastinum/Nodes: No mediastinal, hilar or axillary lymphadenopathy. The visualized thyroid and thoracic esophageal course are unremarkable. Lungs/Pleura: No pulmonary nodules or masses. No pleural effusion or pneumothorax. No focal airspace consolidation. No focal pleural abnormality. Upper Abdomen: Contrast bolus timing is not optimized for evaluation of the abdominal organs. Within this limitation, the visualized organs of the upper abdomen are normal. Musculoskeletal: No chest wall abnormality. No acute or significant osseous findings. Review of the MIP images confirms the above findings. IMPRESSION: 1. No pulmonary embolus or other acute abnormality of the chest. 2.  Aortic Atherosclerosis (ICD10-I70.0). Electronically Signed   By: Ulyses Jarred M.D.   On: 09/15/2017 02:13    Assessment and Plan:   1. Shortness of breath - troponin 0.04 --> 0.04 --> 0.09 --> 0.10 - EKG without signs of acute ischemia - CTA negative for PE - BNP was elevated 992.0 in the setting of a creatinine 1.23 - echo pending  This patient has risk factors for ACS including HTN, HLD, DM, and underlying inflammatory disease. He is a never smoker. It is possible this shortness of breath may be an anginal equivalent or may be related to a reduction in LVEF. Will obtain echocardiogram to evaluate structure and function. Given his risk factors and poor history, he may benefit from coronary CT.   2. HTN - pressures have been generally well-controlled - controlled with diet  3. HLD - stopped statin as it caused myopathies in his lower extremities - last LDL 167 (07/08/17) - consider referral to our lipid clinic for consideration of repatha  4. DM - recent A1c 6.5% - per primary team    For questions or updates, please contact [place] Please consult [email or web address] for contact info under Cardiology/STEMI.    Signed, Ledora Bottcher, [place]  09/15/2017 11:23 AM  Attending Note:   The patient was seen and examined.  Agree with assessment and plan as noted above.  Changes made to the above note as needed.  Patient  seen and independently examined with Angie Duke, Pa.   We discussed all aspects of the encounter. I agree with the assessment and plan as stated above.  1.  Shortness of breath: History is very unreliable.  Patient is not able to provide any consistent history.  He denies having any chest pain today although the notes from yesterday indicate that he had some chest pain.  He recalls that he had significant shortness of breath.  He has numerous risk factors.  We will schedule him for a coronary CT angiogram with FFR evaluation  Tomorrow We will get an echocardiogram.  2.  Essential hypertension: His blood pressure is fairly well controlled.     I have spent a total of 40 minutes with patient reviewing hospital  notes , telemetry, EKGs, labs and examining patient as well as establishing an assessment and plan that was discussed with the patient. > 50% of time was spent in direct patient care.    Thayer Headings, Brooke Bonito., MD, [facility] 09/15/2017, 5:56 PM [address],  [place] Pager [phone]

## 2017-09-15 NOTE — ED Notes (Signed)
Lab called to add on d dimer

## 2017-09-15 NOTE — Progress Notes (Signed)
PT. Arrived to the unit from ED via wheelchair in alert and stable condition. No distress noted. Pt. Does c/o of acid reflux and is requesting med. On call MD to be made aware.  Pt. Placed on telemetry. CCMD notified. Pt. Oriented to the room and call light placed within reach. RN will continue to monitor pt. For changes in condition. Christian [name], Christian [name]

## 2017-09-15 NOTE — Progress Notes (Signed)
  Echocardiogram 2D Echocardiogram has been performed.  Pauleen Goleman T Darneshia Demary 09/15/2017, 3:27 PM

## 2017-09-15 NOTE — H&P (Addendum)
History and Physical    Christian [name] QQV:[id number] DOB: April 21, 1941 DOA: 09/14/2017  PCP: Janith Lima, MD  Patient coming from: Home  I have personally briefly reviewed patient's old medical records in [place]  Chief Complaint: SOB  HPI: Christian [name] is a 76 y.o. male with medical history significant of HTN, HLD, mild DM2 (diet controlled with A1C 6.5), anxiety.  Patient presents to the ED for evaluation of DOE and chest tightness with exertion.  Symptoms intermittent.  Worse with exertion, eating, or feeling anxious.  He thinks he has anxiety.  Went to PCP today who did an EKG, got lab work, and sent him in to the ED.   ED Course: Trop neg x2.  EKG shows T wave flattening in V6 but unchanged from priors.  EDP wants obs for CP.   Review of Systems: As per HPI otherwise 10 point review of systems negative.   Past Medical History:  Diagnosis Date  . Hernia of abdominal cavity   . Hyperlipidemia     Past Surgical History:  Procedure Laterality Date  . BACK SURGERY    . HERNIA REPAIR    . SPINE SURGERY       reports that  has never smoked. he has never used smokeless tobacco. He reports that he does not drink alcohol or use drugs.  Allergies  Allergen Reactions  . Amoxicillin Hives  . Bactrim [Sulfamethoxazole-Trimethoprim] Other (See Comments)    Dried out his eyes  . Cephalexin Itching  . Hydrocodone Itching  . Lexapro [Escitalopram Oxalate] Other (See Comments)    disorientation  . Lipitor [Atorvastatin] Other (See Comments)    Damage to calf muscles  . Oxycodone Itching  . Statins Other (See Comments)    Muscle pain   . Vytorin [Ezetimibe-Simvastatin] Other (See Comments)    Damage to calf muscles    Family History  Problem Relation Age of Onset  . Stroke Mother   . Kidney disease Father   . Stroke Father   . Hypertension Father   . Early death Father   . Cancer Neg Hx   . Alcohol abuse Neg Hx   . Diabetes Neg Hx   . Heart disease Neg Hx    . Hyperlipidemia Neg Hx      Prior to Admission medications   Medication Sig Start Date End Date Taking? Authorizing Provider  aspirin EC 81 MG tablet Take 1 tablet (81 mg total) by mouth daily. 07/08/17  Yes Janith Lima, MD  cholecalciferol (VITAMIN D) 1000 units tablet Take 1,000 Units by mouth daily.   Yes [provider]  clonazePAM (KLONOPIN) 1 MG tablet Take 1 tablet (1 mg total) by mouth 2 (two) times daily as needed for anxiety. 09/14/17  Yes Janith Lima, MD  esomeprazole (NEXIUM) 40 MG capsule Take 40 mg by mouth daily as needed (acid reflux).   Yes [provider]  olopatadine (PATANOL) 0.1 % ophthalmic solution Place 1 drop into both eyes daily.   Yes [provider]    Physical Exam: Vitals:   09/15/17 0145 09/15/17 0200 09/15/17 0215 09/15/17 0245  BP: 125/80 (!) 130/95 132/90 119/87  Pulse: (!) 110 (!) 114 (!) 107   Resp: (!) 23 (!) 25 (!) 21 20  Temp:      TempSrc:      SpO2: 93% 95% 93%   Weight:      Height:        Constitutional: NAD, calm, comfortable Eyes: PERRL,  lids and conjunctivae normal ENMT: Mucous membranes are moist. Posterior pharynx clear of any exudate or lesions.Normal dentition.  Neck: normal, supple, no masses, no thyromegaly Respiratory: clear to auscultation bilaterally, no wheezing, no crackles. Normal respiratory effort. No accessory muscle use.  Cardiovascular: Regular rate and rhythm, no murmurs / rubs / gallops. No extremity edema. 2+ pedal pulses. No carotid bruits.  Abdomen: no tenderness, no masses palpated. No hepatosplenomegaly. Bowel sounds positive.  Musculoskeletal: no clubbing / cyanosis. No joint deformity upper and lower extremities. Good ROM, no contractures. Normal muscle tone.  Skin: no rashes, lesions, ulcers. No induration Neurologic: CN 2-12 grossly intact. Sensation intact, DTR normal. Strength 5/5 in all 4.  Psychiatric: Normal judgment and insight. Alert and oriented x 3. Normal mood.     Labs on Admission: I have personally reviewed following labs and imaging studies  CBC: Recent Labs  Lab 09/14/17 1431 09/14/17 1620  WBC 10.2 8.6  NEUTROABS 9.1*  --   HGB 15.2 14.9  HCT 46.5 46.7  MCV 89.6 91.2  PLT 213.0 073   Basic Metabolic Panel: Recent Labs  Lab 09/14/17 1620  NA 137  K 4.3  CL 102  CO2 24  GLUCOSE 157*  BUN 23*  CREATININE 1.23  CALCIUM 8.9   GFR: Estimated Creatinine Clearance: 50.1 mL/min (by C-G formula based on SCr of 1.23 mg/dL). Liver Function Tests: No results for input(s): AST, ALT, ALKPHOS, BILITOT, PROT, ALBUMIN in the last 168 hours. No results for input(s): LIPASE, AMYLASE in the last 168 hours. No results for input(s): AMMONIA in the last 168 hours. Coagulation Profile: No results for input(s): INR, PROTIME in the last 168 hours. Cardiac Enzymes: No results for input(s): CKTOTAL, CKMB, CKMBINDEX, TROPONINI in the last 168 hours. BNP (last 3 results) Recent Labs    09/14/17 1431  PROBNP 992.0*   HbA1C: No results for input(s): HGBA1C in the last 72 hours. CBG: No results for input(s): GLUCAP in the last 168 hours. Lipid Profile: No results for input(s): CHOL, HDL, LDLCALC, TRIG, CHOLHDL, LDLDIRECT in the last 72 hours. Thyroid Function Tests: No results for input(s): TSH, T4TOTAL, FREET4, T3FREE, THYROIDAB in the last 72 hours. Anemia Panel: No results for input(s): VITAMINB12, FOLATE, FERRITIN, TIBC, IRON, RETICCTPCT in the last 72 hours. Urine analysis: No results found for: COLORURINE, APPEARANCEUR, LABSPEC, [place], GLUCOSEU, HGBUR, BILIRUBINUR, KETONESUR, PROTEINUR, UROBILINOGEN, NITRITE, LEUKOCYTESUR  Radiological Exams on Admission: Dg Chest 2 View  Result Date: 09/14/2017 CLINICAL DATA:  Short of breath nausea for 6 weeks EXAM: CHEST  2 VIEW COMPARISON:  None. FINDINGS: Borderline cardiomegaly. Normal vascularity. Low lung volumes. Bibasilar atelectasis. No pneumothorax or pleural effusion. IMPRESSION:  Bibasilar airspace disease. Electronically Signed   By: Marybelle Killings M.D.   On: 09/14/2017 17:54   Ct Angio Chest Pe W And/or Wo Contrast  Result Date: 09/15/2017 CLINICAL DATA:  Shortness of breath EXAM: CT ANGIOGRAPHY CHEST WITH CONTRAST TECHNIQUE: Multidetector CT imaging of the chest was performed using the standard protocol during bolus administration of intravenous contrast. Multiplanar CT image reconstructions and MIPs were obtained to evaluate the vascular anatomy. CONTRAST:  159mL ISOVUE-370 IOPAMIDOL (ISOVUE-370) INJECTION 76% COMPARISON:  Chest radiograph 09/14/2017 FINDINGS: Cardiovascular: Contrast injection is sufficient to demonstrate satisfactory opacification of the pulmonary arteries to the segmental level. There is no pulmonary embolus. The main pulmonary artery is within normal limits for size. There is no CT evidence of acute right heart strain. Mild calcific aortic atherosclerosis. There is a normal 3-vessel arch branching pattern. Heart  size is normal, without pericardial effusion. Mediastinum/Nodes: No mediastinal, hilar or axillary lymphadenopathy. The visualized thyroid and thoracic esophageal course are unremarkable. Lungs/Pleura: No pulmonary nodules or masses. No pleural effusion or pneumothorax. No focal airspace consolidation. No focal pleural abnormality. Upper Abdomen: Contrast bolus timing is not optimized for evaluation of the abdominal organs. Within this limitation, the visualized organs of the upper abdomen are normal. Musculoskeletal: No chest wall abnormality. No acute or significant osseous findings. Review of the MIP images confirms the above findings. IMPRESSION: 1. No pulmonary embolus or other acute abnormality of the chest. 2.  Aortic Atherosclerosis (ICD10-I70.0). Electronically Signed   By: Ulyses Jarred M.D.   On: 09/15/2017 02:13    EKG: Independently reviewed.  Assessment/Plan Principal Problem:   Chest pain, rule out acute myocardial infarction Active  Problems:   Hyperlipidemia LDL goal <130   Essential hypertension   Type 2 diabetes mellitus with complication, without long-term current use of insulin (HCC)    1. CP r/o - 1. Actually it looks more like a severe hypoxia with very minimal exertion 1. Im sitting here watching the patients O2 sat drop into the low 80s with just standing up at bedside 2. He states that this is his typical "panic attack" symptoms that he is presenting with, but clearly has O2 sat in the 80s with a very good wave form. 3. Is able to recover on room air when laying down however 4. R to L shunt? pulm HTN? Certainly this is NOT just a panic attack. 2. 2D echo ordered 1. If this doesn't find anything major, then consider stress echo (since there is definitely something happening with minimal exertion). 3. CTA chest is neg for PE (or other acute abnormality to explain presentation). 4. CP obs pathway 5. Serial trops 6. Tele monitor 7. NPO 8. Cards recs in AM 2. DM2 - 1. Diet controlled 2. A1C 6.5 3. HTN - 1. Looks like he had been on Telmisartan which PCP stopped today 2. Will leave this off then. 4. HLD - 1. "allergic" to statins per patient  DVT prophylaxis: Lovenox Code Status: Full Family Communication: No family in room Disposition Plan: Home after admit Consults called: Cards to see in AM Admission status: Place in obs for the moment - but suspect he may qualify for inpatient admission with degree of hypoxia with minimal exertion.   Etta Quill DO [facility] Pager [phone]  If 7AM-7PM, please contact day team taking care of patient [email or web address] Password [facility]  09/15/2017, 3:23 AM

## 2017-09-15 NOTE — Progress Notes (Signed)
  PROGRESS NOTE  Patient admitted earlier this morning. See H&P.   Patient admitted with 6-week history of progressive shortness of breath.  He denies any chest pain on my examination.  Currently he is under observation for further cardiac workup.  D-dimer was elevated, CTA chest negative for pulmonary embolism.  Troponin 0.04 --> 0.04 --> 0.09 --> 0.10.  Patient without distress on my examination, denied any chest pain or shortness of breath at rest, had some nausea without vomiting, no peripheral edema.  Cardiology was consulted overnight, echocardiogram is pending.    Dessa Phi, DO [facility] [email or web address] Password [facility] 09/15/2017, 12:03 PM

## 2017-09-15 NOTE — Plan of Care (Signed)
  Safety: Ability to remain free from injury will improve 09/15/2017 2231 - Progressing by Tristan Schroeder, RN   Coping: Level of anxiety will decrease 09/15/2017 2231 - Progressing by Tristan Schroeder, RN

## 2017-09-15 NOTE — Plan of Care (Signed)
Pt. Up and ambulating in room

## 2017-09-15 NOTE — Care Management Obs Status (Signed)
[place] NOTIFICATION   Patient Details  Name: Christian [name] MRN: [id number] Date of Birth: Nov 19, 1940   Medicare Observation Status Notification Given:  Yes    Carles Collet, RN 09/15/2017, 9:55 AM

## 2017-09-15 NOTE — ED Provider Notes (Signed)
[facility] EMERGENCY DEPARTMENT Provider Note   CSN: [id number] Arrival date & time: 09/14/17  1607     History   Chief Complaint Chief Complaint  Patient presents with  . Shortness of Breath    HPI Christian [name] is a 76 y.o. male.  HPI   [name] is a 76yo male with a history of hypertension, hyperlipidemia, T2DM (diet controlled), anxiety, depression who presents to the Emergency Department for evaluation of shortness of breath and chest tightness.  Patient states that he has had 6 weeks of intermittent episodes of shortness of breath which is worsened with exertion, eating or feeling anxious. States shortness of breath is getting worse with some associated chest tightness. States that he was seen by his primary care doctor earlier today who got lab work and EKG and told him that he needed to come to the emergency department immediately. He denies chest pain, nausea/vomiting, diaphoresis, palpitations, fever, cough, numbness, weakness, abdominal pain, urinary frequency, dysuria. He denies history of exertional chest pain.  Denies smoking history.  Denies family history of heart disease. Denies history of PE/DVT, denies leg swelling.   Past Medical History:  Diagnosis Date  . Hernia of abdominal cavity   . Hyperlipidemia     Patient Active Problem List   Diagnosis Date Noted  . Chest pain 09/14/2017  . SOB (shortness of breath) 09/14/2017  . Essential hypertension 07/08/2017  . Depression with anxiety 07/08/2017  . Type 2 diabetes mellitus with complication, without long-term current use of insulin ([place]) 07/08/2017  . Hyperglycemia 05/25/2017  . Hyperlipidemia LDL goal <130 05/25/2017  . Neuropathy involving both lower extremities 05/25/2017  . Idiopathic inflammatory myopathy 05/25/2017    Past Surgical History:  Procedure Laterality Date  . BACK SURGERY    . HERNIA REPAIR    . SPINE SURGERY         Home Medications    Prior to Admission  medications   Medication Sig Start Date End Date Taking? Authorizing Provider  aspirin EC 81 MG tablet Take 1 tablet (81 mg total) by mouth daily. 07/08/17   Janith Lima, MD  cholecalciferol (VITAMIN D) 1000 units tablet Take 1,000 Units by mouth daily.    [provider]  clonazePAM (KLONOPIN) 1 MG tablet Take 1 tablet (1 mg total) by mouth 2 (two) times daily as needed for anxiety. 09/14/17   Janith Lima, MD  esomeprazole (NEXIUM) 40 MG capsule Take 40 mg by mouth daily as needed (acid reflux).    [provider]  olopatadine (PATANOL) 0.1 % ophthalmic solution Place 1 drop into both eyes daily.    [provider]    Family History Family History  Problem Relation Age of Onset  . Stroke Mother   . Kidney disease Father   . Stroke Father   . Hypertension Father   . Early death Father   . Cancer Neg Hx   . Alcohol abuse Neg Hx   . Diabetes Neg Hx   . Heart disease Neg Hx   . Hyperlipidemia Neg Hx     Social History Social History   Tobacco Use  . Smoking status: Never Smoker  . Smokeless tobacco: Never Used  Substance Use Topics  . Alcohol use: No  . Drug use: No     Allergies   Amoxicillin; Bactrim [sulfamethoxazole-trimethoprim]; Cephalexin; Hydrocodone; Lexapro [escitalopram oxalate]; Lipitor [atorvastatin]; Oxycodone; Statins; and Vytorin [ezetimibe-simvastatin]   Review of Systems Review of Systems  Constitutional: Negative for  chills, fatigue and fever.  HENT: Negative for congestion.   Eyes: Negative for visual disturbance.  Respiratory: Positive for chest tightness and shortness of breath. Negative for cough and wheezing.   Cardiovascular: Negative for chest pain, palpitations and leg swelling.  Gastrointestinal: Negative for abdominal pain, nausea and vomiting.  Genitourinary: Negative for difficulty urinating, dysuria and frequency.  Musculoskeletal: Negative for back pain and gait problem.  Skin: Negative for wound.    Neurological: Negative for dizziness, weakness, light-headedness and numbness.  Psychiatric/Behavioral: Negative for agitation. The patient is nervous/anxious.      Physical Exam Updated Vital Signs BP (!) 122/105   Pulse 96   Temp 98.5 F (36.9 C) (Oral)   Resp (!) 22   Ht 5\' 6"  (1.676 m)   Wt 80.3 kg (177 lb)   SpO2 97%   BMI 28.57 kg/m   Physical Exam  Constitutional: He appears well-developed and well-nourished. No distress.  Patient sitting comfortably at bedside in no apparent distress.   HENT:  Head: Normocephalic and atraumatic.  Mouth/Throat: Oropharynx is clear and moist. No oropharyngeal exudate.  Eyes: Pupils are equal, round, and reactive to light. Right eye exhibits no discharge. Left eye exhibits no discharge.  Neck: Normal range of motion. Neck supple. No JVD present. No tracheal deviation present.  Cardiovascular: Exam reveals no friction rub.  No murmur heard. Tachycardic, irregular rhythm.   Pulmonary/Chest: Effort normal. No accessory muscle usage. No respiratory distress. He has no wheezes. He has no rhonchi. He has no rales. He exhibits no tenderness.  Breathing comfortably on room air.   Abdominal: Soft. Bowel sounds are normal. He exhibits no distension and no mass. There is no tenderness. There is no guarding.  Musculoskeletal:  No leg swelling, erythema or tenderness.   Neurological: He is alert. Coordination normal.  Skin: Skin is warm and dry. Capillary refill takes less than 2 seconds. He is not diaphoretic.  Psychiatric: He has a normal mood and affect. His behavior is normal.  Nursing note and vitals reviewed.    ED Treatments / Results  Labs (all labs ordered are listed, but only abnormal results are displayed) Labs Reviewed  BASIC METABOLIC PANEL - Abnormal; Notable for the following components:      Result Value   Glucose, Bld 157 (*)    BUN 23 (*)    GFR calc non Af Amer 55 (*)    All other components within normal limits  CBC   I-STAT TROPONIN, ED  I-STAT TROPONIN, ED    EKG  EKG Interpretation  Date/Time:  Monday September 14 2017 16:12:10 EST Ventricular Rate:  103 PR Interval:  158 QRS Duration: 88 QT Interval:  370 QTC Calculation: 484 R Axis:   0 Text Interpretation:  Sinus tachycardia with occasional Premature ventricular complexes Septal infarct , age undetermined Abnormal ECG When compared with ECG of 12/16/2014, No significant change was found Confirmed by Delora Fuel ([id number]) on 09/15/2017 12:01:37 AM       Radiology Dg Chest 2 View  Result Date: 09/14/2017 CLINICAL DATA:  Short of breath nausea for 6 weeks EXAM: CHEST  2 VIEW COMPARISON:  None. FINDINGS: Borderline cardiomegaly. Normal vascularity. Low lung volumes. Bibasilar atelectasis. No pneumothorax or pleural effusion. IMPRESSION: Bibasilar airspace disease. Electronically Signed   By: Marybelle Killings M.D.   On: 09/14/2017 17:54    Procedures Procedures (including critical care time)  Medications Ordered in ED Medications - No data to display   Initial Impression / Assessment and Plan /  ED Course  I have reviewed the triage vital signs and the nursing notes.  Pertinent labs & imaging results that were available during my care of the patient were reviewed by me and considered in my medical decision making (see chart for details).  Clinical Course as of Sep 15 1656  Tue Sep 15, 2017  0303 Dr. Alcario Drought with [facility] accepting admission for ACS rule out.  [JR]    Clinical Course User Index [JR] Robinson, [place] N, PA-C   Patient presents via his primary care office for six weeks of worsening shortness of breath with chest tightness. Per chart review he was found to have a slightly positive troponin with ectopic ventricular couplets on EKG. On exam he is in no acute respiratory distress, appears comfortable at the bedside and answers questions easily. He has no cardiac history, does have ACS risk factors including HTN, HLD and T2DM.    Basic labs reviewed. Negative troponin. CBC and BMP unremarkable. His CXR does not show pneumonia, pneumothorax or acute process. He does not have DVT risk factors, but is tachycardic and tachypneic. Low suspicion for PE, will get a D-dimer to further evaluate.  D-dimer positive. Will get CT angio to evaluate for PE. Regardless of PE study, plan to admit the patient for observation, ACS r/o. HEART score 5. Sign out given to [place] Robinson at shift change for CT angio results and admission.    Final Clinical Impressions(s) / ED Diagnoses   Final diagnoses:  None    ED Discharge Orders    None       Glyn Ade, PA-C 09/15/17 0132    Glyn Ade, PA-C 09/15/17 1657    Nat Christen, MD 09/15/17 2023

## 2017-09-15 NOTE — Progress Notes (Signed)
PT. With elevated Troponin of 0.87. On call for [facility] made aware. RN will continue to monitor pt. For changes in condition.

## 2017-09-15 NOTE — ED Provider Notes (Signed)
Care assumed from [facility], PA-C, pending CTA results. Pt presenting from PCP with several weeks of SOB and chest tightness. At PCP office, pt with slightly elevated trop. Pt sent here for evaluation. Trop x2 in ED are negative. CXR neg. Pt with mild tachycardia. D-dimer resulting positive. CTA chest ordered. Anticipated admission for elevated troponin for ACS work up.   CTA chest neg for PE. Dr. Alcario Drought with [facility] accepting admission for ACS rule out.  The patient appears reasonably stabilized for admission considering the current resources, flow, and capabilities available in the ED at this time, and I doubt any other [facility] requiring further screening and/or treatment in the ED prior to admission.    Roshawn Lacina, [place] N, [place] 09/15/17 [phone]

## 2017-09-16 ENCOUNTER — Encounter (HOSPITAL_COMMUNITY): Payer: Self-pay | Admitting: Cardiology

## 2017-09-16 ENCOUNTER — Encounter (HOSPITAL_COMMUNITY)
Admission: EM | Disposition: A | Discharge status: Home / Self Care | Payer: Self-pay | Source: Home / Self Care | Attending: Emergency Medicine

## 2017-09-16 DIAGNOSIS — I5021 Acute systolic (congestive) heart failure: Secondary | ICD-10-CM | POA: Diagnosis not present

## 2017-09-16 DIAGNOSIS — E118 Type 2 diabetes mellitus with unspecified complications: Secondary | ICD-10-CM | POA: Diagnosis not present

## 2017-09-16 DIAGNOSIS — E785 Hyperlipidemia, unspecified: Secondary | ICD-10-CM

## 2017-09-16 DIAGNOSIS — R079 Chest pain, unspecified: Secondary | ICD-10-CM | POA: Diagnosis not present

## 2017-09-16 DIAGNOSIS — R0609 Other forms of dyspnea: Secondary | ICD-10-CM | POA: Diagnosis not present

## 2017-09-16 DIAGNOSIS — I10 Essential (primary) hypertension: Secondary | ICD-10-CM | POA: Diagnosis not present

## 2017-09-16 DIAGNOSIS — I5022 Chronic systolic (congestive) heart failure: Secondary | ICD-10-CM

## 2017-09-16 HISTORY — PX: RIGHT/LEFT HEART CATH AND CORONARY ANGIOGRAPHY: CATH118266

## 2017-09-16 LAB — BASIC METABOLIC PANEL
Anion gap: 7 (ref 5–15)
BUN: 24 mg/dL — ABNORMAL HIGH (ref 6–20)
CHLORIDE: 104 mmol/L (ref 101–111)
CO2: 28 mmol/L (ref 22–32)
CREATININE: 1.2 mg/dL (ref 0.61–1.24)
Calcium: 8.1 mg/dL — ABNORMAL LOW (ref 8.9–10.3)
GFR calc non Af Amer: 57 mL/min — ABNORMAL LOW (ref >60)
Glucose, Bld: 130 mg/dL — ABNORMAL HIGH (ref 65–99)
POTASSIUM: 4.5 mmol/L (ref 3.5–5.1)
Sodium: 139 mmol/L (ref 135–145)

## 2017-09-16 LAB — CBC
HEMATOCRIT: 43 % (ref 39.0–52.0)
HEMOGLOBIN: 13.5 g/dL (ref 13.0–17.0)
MCH: 28.8 pg (ref 26.0–34.0)
MCHC: 31.4 g/dL (ref 30.0–36.0)
MCV: 91.9 fL (ref 78.0–100.0)
Platelets: 155 10*3/uL (ref 150–400)
RBC: 4.68 MIL/uL (ref 4.22–5.81)
RDW: 15.3 % (ref 11.5–15.5)
WBC: 9.7 10*3/uL (ref 4.0–10.5)

## 2017-09-16 LAB — POCT I-STAT 3, ART BLOOD GAS (G3+)
ACID-BASE EXCESS: 3 mmol/L — AB (ref 0.0–2.0)
Bicarbonate: 28.4 mmol/L — ABNORMAL HIGH (ref 20.0–28.0)
O2 Saturation: 99 %
PO2 ART: 122 mmHg — AB (ref 83.0–108.0)
TCO2: 30 mmol/L (ref 22–32)
pCO2 arterial: 44.7 mmHg (ref 32.0–48.0)
pH, Arterial: 7.411 (ref 7.350–7.450)

## 2017-09-16 LAB — POCT I-STAT 3, VENOUS BLOOD GAS (G3P V)
ACID-BASE EXCESS: 2 mmol/L (ref 0.0–2.0)
Bicarbonate: 28 mmol/L (ref 20.0–28.0)
O2 Saturation: 64 %
TCO2: 29 mmol/L (ref 22–32)
pCO2, Ven: 50.1 mmHg (ref 44.0–60.0)
pH, Ven: 7.355 (ref 7.250–7.430)
pO2, Ven: 35 mmHg (ref 32.0–45.0)

## 2017-09-16 LAB — PROTIME-INR
INR: 1.07
Prothrombin Time: 13.8 seconds (ref 11.4–15.2)

## 2017-09-16 SURGERY — RIGHT/LEFT HEART CATH AND CORONARY ANGIOGRAPHY
Anesthesia: LOCAL

## 2017-09-16 MED ORDER — HEPARIN (PORCINE) IN NACL 2-0.9 UNIT/ML-% IJ SOLN
INTRAMUSCULAR | Status: AC | PRN
  Administered 2017-09-16: 1000 mL

## 2017-09-16 MED ORDER — ASPIRIN 81 MG PO CHEW
81.0000 mg | CHEWABLE_TABLET | ORAL | Status: AC
  Administered 2017-09-16: 81 mg via ORAL
  Filled 2017-09-16: qty 1

## 2017-09-16 MED ORDER — HEPARIN (PORCINE) IN NACL 2-0.9 UNIT/ML-% IJ SOLN
INTRAMUSCULAR | Status: DC | PRN
  Administered 2017-09-16: 10 mL via INTRA_ARTERIAL

## 2017-09-16 MED ORDER — SODIUM CHLORIDE 0.9 % IV SOLN
INTRAVENOUS | Status: DC
  Administered 2017-09-16: 10:00:00 via INTRAVENOUS

## 2017-09-16 MED ORDER — SODIUM CHLORIDE 0.9% FLUSH: 3.0000 mL | INTRAVENOUS | Status: DC | PRN

## 2017-09-16 MED ORDER — SODIUM CHLORIDE 0.9% FLUSH
3.0000 mL | Freq: Two times a day (BID) | INTRAVENOUS | Status: DC
  Administered 2017-09-16 (×2): 3 mL via INTRAVENOUS
  Administered 2017-09-17: 10 mL via INTRAVENOUS

## 2017-09-16 MED ORDER — HEPARIN (PORCINE) IN NACL 2-0.9 UNIT/ML-% IJ SOLN
INTRAMUSCULAR | Status: AC
  Filled 2017-09-16: qty 500

## 2017-09-16 MED ORDER — HEPARIN SODIUM (PORCINE) 1000 UNIT/ML IJ SOLN
INTRAMUSCULAR | Status: DC | PRN
  Administered 2017-09-16: 4000 [IU] via INTRAVENOUS

## 2017-09-16 MED ORDER — MIDAZOLAM HCL 2 MG/2ML IJ SOLN
INTRAMUSCULAR | Status: DC | PRN
  Administered 2017-09-16: 1 mg via INTRAVENOUS

## 2017-09-16 MED ORDER — FUROSEMIDE 10 MG/ML IJ SOLN
40.0000 mg | Freq: Every day | INTRAMUSCULAR | Status: DC
  Administered 2017-09-17: 40 mg via INTRAVENOUS
  Filled 2017-09-16: qty 4

## 2017-09-16 MED ORDER — ENOXAPARIN SODIUM 40 MG/0.4ML ~~LOC~~ SOLN
40.0000 mg | SUBCUTANEOUS | Status: DC
  Administered 2017-09-17: 40 mg via SUBCUTANEOUS
  Filled 2017-09-16: qty 0.4

## 2017-09-16 MED ORDER — HEPARIN SODIUM (PORCINE) 1000 UNIT/ML IJ SOLN
INTRAMUSCULAR | Status: AC
  Filled 2017-09-16: qty 1

## 2017-09-16 MED ORDER — MIDAZOLAM HCL 2 MG/2ML IJ SOLN
INTRAMUSCULAR | Status: AC
  Filled 2017-09-16: qty 2

## 2017-09-16 MED ORDER — IOPAMIDOL (ISOVUE-370) INJECTION 76%
INTRAVENOUS | Status: AC
  Filled 2017-09-16: qty 100

## 2017-09-16 MED ORDER — LIDOCAINE HCL (PF) 1 % IJ SOLN
INTRAMUSCULAR | Status: AC
  Filled 2017-09-16: qty 30

## 2017-09-16 MED ORDER — VERAPAMIL HCL 2.5 MG/ML IV SOLN
INTRAVENOUS | Status: AC
Start: 2017-09-16 — End: 2017-09-16
  Filled 2017-09-16: qty 2

## 2017-09-16 MED ORDER — SODIUM CHLORIDE 0.9 % IV SOLN: 250.0000 mL | INTRAVENOUS | Status: DC | PRN

## 2017-09-16 MED ORDER — FENTANYL CITRATE (PF) 100 MCG/2ML IJ SOLN
INTRAMUSCULAR | Status: AC
  Filled 2017-09-16: qty 2

## 2017-09-16 MED ORDER — LIDOCAINE HCL (PF) 1 % IJ SOLN
INTRAMUSCULAR | Status: DC | PRN
  Administered 2017-09-16 (×2): 5 mL

## 2017-09-16 MED ORDER — FENTANYL CITRATE (PF) 100 MCG/2ML IJ SOLN
INTRAMUSCULAR | Status: DC | PRN
  Administered 2017-09-16: 25 ug via INTRAVENOUS

## 2017-09-16 MED ORDER — SODIUM CHLORIDE 0.9% FLUSH
3.0000 mL | Freq: Two times a day (BID) | INTRAVENOUS | Status: DC
  Administered 2017-09-16: 3 mL via INTRAVENOUS

## 2017-09-16 MED ORDER — IOPAMIDOL (ISOVUE-370) INJECTION 76%
INTRAVENOUS | Status: DC | PRN
  Administered 2017-09-16: 60 mL via INTRA_ARTERIAL

## 2017-09-16 SURGICAL SUPPLY — 14 items
CATH 5FR JL3.5 JR4 ANG PIG MP (CATHETERS) ×2 IMPLANT
CATH BALLN WEDGE 5F 110CM (CATHETERS) ×2 IMPLANT
DEVICE RAD COMP TR BAND LRG (VASCULAR PRODUCTS) ×2 IMPLANT
GLIDESHEATH SLEND SS 6F .021 (SHEATH) ×2 IMPLANT
GUIDEWIRE .025 260CM (WIRE) ×2 IMPLANT
GUIDEWIRE INQWIRE 1.5J.035X260 (WIRE) ×1 IMPLANT
INQWIRE 1.5J .035X260CM (WIRE) ×2
KIT HEART LEFT (KITS) ×2 IMPLANT
PACK CARDIAC CATHETERIZATION (CUSTOM PROCEDURE TRAY) ×2 IMPLANT
SHEATH GLIDE SLENDER 4/5FR (SHEATH) ×2 IMPLANT
SYR MEDRAD MARK V 150ML (SYRINGE) ×2 IMPLANT
TRANSDUCER W/STOPCOCK (MISCELLANEOUS) ×2 IMPLANT
TUBING CIL FLEX 10 FLL-RA (TUBING) ×2 IMPLANT
WIRE EMERALD 3MM-J .025X260CM (WIRE) ×2 IMPLANT

## 2017-09-16 NOTE — Progress Notes (Signed)
Progress Note  Patient Name: Christian [name] Date of Encounter: 09/16/2017  Primary Cardiologist: Dr. Acie Fredrickson  Subjective   Pt found sleeping flat on his side. He is not requiring supplemental oxygen. He denies chest pain.  Inpatient Medications    Scheduled Meds: . aspirin EC  81 mg Oral Daily  . cholecalciferol  1,000 Units Oral Daily  . enoxaparin (LOVENOX) injection  40 mg Subcutaneous Daily  . olopatadine  1 drop Both Eyes Daily  . pantoprazole  80 mg Oral Q1200   Continuous Infusions:  PRN Meds: acetaminophen, alum & mag hydroxide-simeth, clonazePAM, ondansetron (ZOFRAN) IV   Vital Signs    Vitals:   09/15/17 1848 09/15/17 2030 09/16/17 0019 09/16/17 0411  BP: 136/86 104/69 102/78 107/67  Pulse: (!) 58 81 83 82  Resp: 18 16 18 20   Temp: 97.8 F (36.6 C) 97.8 F (36.6 C) 97.6 F (36.4 C) 97.7 F (36.5 C)  TempSrc: Oral Oral Oral Oral  SpO2: 98% 97% 98% 95%  Weight:    171 lb 1.6 oz (77.6 kg)  Height:        Intake/Output Summary (Last 24 hours) at 09/16/2017 0813 Last data filed at 09/16/2017 0618 Gross per 24 hour  Intake 480 ml  Output 1350 ml  Net -870 ml   Filed Weights   09/14/17 1622 09/15/17 0446 09/16/17 0411  Weight: 177 lb (80.3 kg) 173 lb 4.8 oz (78.6 kg) 171 lb 1.6 oz (77.6 kg)     Physical Exam   General: Well developed, well nourished, male appearing in no acute distress. Head: Normocephalic, atraumatic.  Neck: Supple without bruits, + JVD Lungs:  Resp regular and unlabored, CTA. Heart: RRR, S1, S2, no murmur; no rub. Abdomen: Soft, non-tender, non-distended with normoactive bowel sounds. No hepatomegaly. No rebound/guarding. No obvious abdominal masses. Extremities: No clubbing, cyanosis, no edema. Distal pedal pulses are 2+ bilaterally. Neuro: Alert and oriented X 3. Moves all extremities spontaneously. Psych: Normal affect.  Labs    Chemistry Recent Labs  Lab 09/14/17 1620 09/16/17 0529  NA 137 139  K 4.3 4.5  CL 102  104  CO2 24 28  GLUCOSE 157* 130*  BUN 23* 24*  CREATININE 1.23 1.20  CALCIUM 8.9 8.1*  GFRNONAA 55* 57*  GFRAA >60 >60  ANIONGAP 11 7     Hematology Recent Labs  Lab 09/14/17 1431 09/14/17 1620 09/16/17 0529  WBC 10.2 8.6 9.7  RBC 5.19 5.12 4.68  HGB 15.2 14.9 13.5  HCT 46.5 46.7 43.0  MCV 89.6 91.2 91.9  MCH  --  29.1 28.8  MCHC 32.6 31.9 31.4  RDW 14.9 15.2 15.3  PLT 213.0 197 155    Cardiac Enzymes Recent Labs  Lab 09/15/17 0405 09/15/17 0706  TROPONINI 0.09* 0.10*    Recent Labs  Lab 09/14/17 1656 09/15/17 0035  TROPIPOC 0.04 0.04     BNP Recent Labs  Lab 09/14/17 1431  PROBNP 992.0*     DDimer  Recent Labs  Lab 09/14/17 1431 09/14/17 1630  DDIMER 0.82* 0.87*     Radiology    Dg Chest 2 View  Result Date: 09/14/2017 CLINICAL DATA:  Short of breath nausea for 6 weeks EXAM: CHEST  2 VIEW COMPARISON:  None. FINDINGS: Borderline cardiomegaly. Normal vascularity. Low lung volumes. Bibasilar atelectasis. No pneumothorax or pleural effusion. IMPRESSION: Bibasilar airspace disease. Electronically Signed   By: Marybelle Killings M.D.   On: 09/14/2017 17:54   Ct Angio Chest Pe W And/or Wo Contrast  Result Date: 09/15/2017 CLINICAL DATA:  Shortness of breath EXAM: CT ANGIOGRAPHY CHEST WITH CONTRAST TECHNIQUE: Multidetector CT imaging of the chest was performed using the standard protocol during bolus administration of intravenous contrast. Multiplanar CT image reconstructions and MIPs were obtained to evaluate the vascular anatomy. CONTRAST:  129mL ISOVUE-370 IOPAMIDOL (ISOVUE-370) INJECTION 76% COMPARISON:  Chest radiograph 09/14/2017 FINDINGS: Cardiovascular: Contrast injection is sufficient to demonstrate satisfactory opacification of the pulmonary arteries to the segmental level. There is no pulmonary embolus. The main pulmonary artery is within normal limits for size. There is no CT evidence of acute right heart strain. Mild calcific aortic atherosclerosis.  There is a normal 3-vessel arch branching pattern. Heart size is normal, without pericardial effusion. Mediastinum/Nodes: No mediastinal, hilar or axillary lymphadenopathy. The visualized thyroid and thoracic esophageal course are unremarkable. Lungs/Pleura: No pulmonary nodules or masses. No pleural effusion or pneumothorax. No focal airspace consolidation. No focal pleural abnormality. Upper Abdomen: Contrast bolus timing is not optimized for evaluation of the abdominal organs. Within this limitation, the visualized organs of the upper abdomen are normal. Musculoskeletal: No chest wall abnormality. No acute or significant osseous findings. Review of the MIP images confirms the above findings. IMPRESSION: 1. No pulmonary embolus or other acute abnormality of the chest. 2.  Aortic Atherosclerosis (ICD10-I70.0). Electronically Signed   By: Ulyses Jarred M.D.   On: 09/15/2017 02:13     Telemetry    Sinus  - Personally Reviewed  ECG    No new tracings - Personally Reviewed  Cardiac Studies   Echo 09/15/17: Study Conclusions - Left ventricle: The cavity size was mildly dilated. Wall   thickness was increased in a pattern of mild LVH. The estimated   ejection fraction was 20%. Diffuse hypokinesis. The study is not   technically sufficient to allow evaluation of LV diastolic   function. - Aortic valve: Mildly calcified leaflets. There was no stenosis.   There was mild regurgitation. - Aorta: Ascending aorta dimension: 4.2 cm. - Ascending aorta: The ascending aorta was mildly dilated. - Mitral valve: Mildly thickened leaflets . There was mild   regurgitation. - Left atrium: The atrium was normal in size. - Tricuspid valve: There was mild regurgitation. - Pulmonic valve: There was trivial regurgitation. - Pulmonary arteries: PA peak pressure: 44 mm Hg (S). - Inferior vena cava: The vessel was normal in size. The   respirophasic diameter changes were in the normal range (>= 50%),   consistent  with normal central venous pressure.  Impressions: - LVEF 20%, global hypokinesis, mildly dilated LV with mild LVH,   mild AI, dilated ascending aorta to 4.2 cm, mild MR, normal LA   size, mild TR, trivial PI, RVSP 44 mmHg, normal IVC.  Patient Profile     76 y.o. male with a hx of HLD, HTN, DM, neuropathy, SOB, idiopathic inflammatory myopathy, and abdominal hernia who is being seen today for the evaluation of dyspnea.  Assessment & Plan    1. New onset systolic heart failure, shortness of breath - patient found sleeping flat without dyspnea - echo yesterday with LVEF of 20% and diffuse hypokinesis - original plan for coronary CT was aborted and he was scheduled for right and left heart catheterization today - pt understands the risks and benefits and wishes to proceed   2. HTN - pressures continues to be well-controlled   3. HLD - intolerant to statins - needs lipid clinic follow up for consideration of repatha   4. DM - recent A1c 6.5% -  per primary team   Signed, Tami Lin Duke , PA-C 8:13 AM 09/16/2017 Pager: [phone]  Attending Note:   The patient was seen and examined.  Agree with assessment and plan as noted above.  Changes made to the above note as needed.  Patient seen and independently examined with Doreene Adas, PA .   We discussed all aspects of the encounter. I agree with the assessment and plan as stated above.  1.  Acute on chronic systolic congestive heart failure: The patient was found to have markedly reduced left ventricular systolic function with an EF of 20%.  He has mild mitral regurgitation. Given this markedly reduced LV function, we will refer him for right and left heart catheterization instead of doing coronary CT angiogram.  I have discussed the risks, benefits, and options regarding heart catheterization.  He understands and agrees to proceed.  2.  Hyperlipidemia: He is intolerant to statins.  We will need to discuss an  injectable PC SK 9 inhibitor.  3.  Essential hypertension: Continue current medications.    I have spent a total of 30 minutes with patient reviewing hospital  notes , telemetry, EKGs, labs and examining patient as well as establishing an assessment and plan that was discussed with the patient. > 50% of time was spent in direct patient care.    Thayer Headings, Brooke Bonito., MD, [facility] 09/16/2017, 8:44 AM [address],  [place] Pager [phone]

## 2017-09-16 NOTE — H&P (View-Only) (Signed)
Progress Note  Patient Name: Christian [name] Date of Encounter: 09/16/2017  Primary Cardiologist: Dr. Acie Fredrickson  Subjective   Pt found sleeping flat on his side. He is not requiring supplemental oxygen. He denies chest pain.  Inpatient Medications    Scheduled Meds: . aspirin EC  81 mg Oral Daily  . cholecalciferol  1,000 Units Oral Daily  . enoxaparin (LOVENOX) injection  40 mg Subcutaneous Daily  . olopatadine  1 drop Both Eyes Daily  . pantoprazole  80 mg Oral Q1200   Continuous Infusions:  PRN Meds: acetaminophen, alum & mag hydroxide-simeth, clonazePAM, ondansetron (ZOFRAN) IV   Vital Signs    Vitals:   09/15/17 1848 09/15/17 2030 09/16/17 0019 09/16/17 0411  BP: 136/86 104/69 102/78 107/67  Pulse: (!) 58 81 83 82  Resp: 18 16 18 20   Temp: 97.8 F (36.6 C) 97.8 F (36.6 C) 97.6 F (36.4 C) 97.7 F (36.5 C)  TempSrc: Oral Oral Oral Oral  SpO2: 98% 97% 98% 95%  Weight:    171 lb 1.6 oz (77.6 kg)  Height:        Intake/Output Summary (Last 24 hours) at 09/16/2017 0813 Last data filed at 09/16/2017 0618 Gross per 24 hour  Intake 480 ml  Output 1350 ml  Net -870 ml   Filed Weights   09/14/17 1622 09/15/17 0446 09/16/17 0411  Weight: 177 lb (80.3 kg) 173 lb 4.8 oz (78.6 kg) 171 lb 1.6 oz (77.6 kg)     Physical Exam   General: Well developed, well nourished, male appearing in no acute distress. Head: Normocephalic, atraumatic.  Neck: Supple without bruits, + JVD Lungs:  Resp regular and unlabored, CTA. Heart: RRR, S1, S2, no murmur; no rub. Abdomen: Soft, non-tender, non-distended with normoactive bowel sounds. No hepatomegaly. No rebound/guarding. No obvious abdominal masses. Extremities: No clubbing, cyanosis, no edema. Distal pedal pulses are 2+ bilaterally. Neuro: Alert and oriented X 3. Moves all extremities spontaneously. Psych: Normal affect.  Labs    Chemistry Recent Labs  Lab 09/14/17 1620 09/16/17 0529  NA 137 139  K 4.3 4.5  CL 102  104  CO2 24 28  GLUCOSE 157* 130*  BUN 23* 24*  CREATININE 1.23 1.20  CALCIUM 8.9 8.1*  GFRNONAA 55* 57*  GFRAA >60 >60  ANIONGAP 11 7     Hematology Recent Labs  Lab 09/14/17 1431 09/14/17 1620 09/16/17 0529  WBC 10.2 8.6 9.7  RBC 5.19 5.12 4.68  HGB 15.2 14.9 13.5  HCT 46.5 46.7 43.0  MCV 89.6 91.2 91.9  MCH  --  29.1 28.8  MCHC 32.6 31.9 31.4  RDW 14.9 15.2 15.3  PLT 213.0 197 155    Cardiac Enzymes Recent Labs  Lab 09/15/17 0405 09/15/17 0706  TROPONINI 0.09* 0.10*    Recent Labs  Lab 09/14/17 1656 09/15/17 0035  TROPIPOC 0.04 0.04     BNP Recent Labs  Lab 09/14/17 1431  PROBNP 992.0*     DDimer  Recent Labs  Lab 09/14/17 1431 09/14/17 1630  DDIMER 0.82* 0.87*     Radiology    Dg Chest 2 View  Result Date: 09/14/2017 CLINICAL DATA:  Short of breath nausea for 6 weeks EXAM: CHEST  2 VIEW COMPARISON:  None. FINDINGS: Borderline cardiomegaly. Normal vascularity. Low lung volumes. Bibasilar atelectasis. No pneumothorax or pleural effusion. IMPRESSION: Bibasilar airspace disease. Electronically Signed   By: Marybelle Killings M.D.   On: 09/14/2017 17:54   Ct Angio Chest Pe W And/or Wo Contrast  Result Date: 09/15/2017 CLINICAL DATA:  Shortness of breath EXAM: CT ANGIOGRAPHY CHEST WITH CONTRAST TECHNIQUE: Multidetector CT imaging of the chest was performed using the standard protocol during bolus administration of intravenous contrast. Multiplanar CT image reconstructions and MIPs were obtained to evaluate the vascular anatomy. CONTRAST:  19mL ISOVUE-370 IOPAMIDOL (ISOVUE-370) INJECTION 76% COMPARISON:  Chest radiograph 09/14/2017 FINDINGS: Cardiovascular: Contrast injection is sufficient to demonstrate satisfactory opacification of the pulmonary arteries to the segmental level. There is no pulmonary embolus. The main pulmonary artery is within normal limits for size. There is no CT evidence of acute right heart strain. Mild calcific aortic atherosclerosis.  There is a normal 3-vessel arch branching pattern. Heart size is normal, without pericardial effusion. Mediastinum/Nodes: No mediastinal, hilar or axillary lymphadenopathy. The visualized thyroid and thoracic esophageal course are unremarkable. Lungs/Pleura: No pulmonary nodules or masses. No pleural effusion or pneumothorax. No focal airspace consolidation. No focal pleural abnormality. Upper Abdomen: Contrast bolus timing is not optimized for evaluation of the abdominal organs. Within this limitation, the visualized organs of the upper abdomen are normal. Musculoskeletal: No chest wall abnormality. No acute or significant osseous findings. Review of the MIP images confirms the above findings. IMPRESSION: 1. No pulmonary embolus or other acute abnormality of the chest. 2.  Aortic Atherosclerosis (ICD10-I70.0). Electronically Signed   By: Ulyses Jarred M.D.   On: 09/15/2017 02:13     Telemetry    Sinus  - Personally Reviewed  ECG    No new tracings - Personally Reviewed  Cardiac Studies   Echo 09/15/17: Study Conclusions - Left ventricle: The cavity size was mildly dilated. Wall   thickness was increased in a pattern of mild LVH. The estimated   ejection fraction was 20%. Diffuse hypokinesis. The study is not   technically sufficient to allow evaluation of LV diastolic   function. - Aortic valve: Mildly calcified leaflets. There was no stenosis.   There was mild regurgitation. - Aorta: Ascending aorta dimension: 4.2 cm. - Ascending aorta: The ascending aorta was mildly dilated. - Mitral valve: Mildly thickened leaflets . There was mild   regurgitation. - Left atrium: The atrium was normal in size. - Tricuspid valve: There was mild regurgitation. - Pulmonic valve: There was trivial regurgitation. - Pulmonary arteries: PA peak pressure: 44 mm Hg (S). - Inferior vena cava: The vessel was normal in size. The   respirophasic diameter changes were in the normal range (>= 50%),   consistent  with normal central venous pressure.  Impressions: - LVEF 20%, global hypokinesis, mildly dilated LV with mild LVH,   mild AI, dilated ascending aorta to 4.2 cm, mild MR, normal LA   size, mild TR, trivial PI, RVSP 44 mmHg, normal IVC.  Patient Profile     76 y.o. male with a hx of HLD, HTN, DM, neuropathy, SOB, idiopathic inflammatory myopathy, and abdominal hernia who is being seen today for the evaluation of dyspnea.  Assessment & Plan    1. New onset systolic heart failure, shortness of breath - patient found sleeping flat without dyspnea - echo yesterday with LVEF of 20% and diffuse hypokinesis - original plan for coronary CT was aborted and he was scheduled for right and left heart catheterization today - pt understands the risks and benefits and wishes to proceed   2. HTN - pressures continues to be well-controlled   3. HLD - intolerant to statins - needs lipid clinic follow up for consideration of repatha   4. DM - recent A1c 6.5% -  per primary team   Signed, Tami Lin Duke , PA-C 8:13 AM 09/16/2017 Pager: [phone]  Attending Note:   The patient was seen and examined.  Agree with assessment and plan as noted above.  Changes made to the above note as needed.  Patient seen and independently examined with Doreene Adas, PA .   We discussed all aspects of the encounter. I agree with the assessment and plan as stated above.  1.  Acute on chronic systolic congestive heart failure: The patient was found to have markedly reduced left ventricular systolic function with an EF of 20%.  He has mild mitral regurgitation. Given this markedly reduced LV function, we will refer him for right and left heart catheterization instead of doing coronary CT angiogram.  I have discussed the risks, benefits, and options regarding heart catheterization.  He understands and agrees to proceed.  2.  Hyperlipidemia: He is intolerant to statins.  We will need to discuss an  injectable PC SK 9 inhibitor.  3.  Essential hypertension: Continue current medications.    I have spent a total of 30 minutes with patient reviewing hospital  notes , telemetry, EKGs, labs and examining patient as well as establishing an assessment and plan that was discussed with the patient. > 50% of time was spent in direct patient care.    Thayer Headings, Brooke Bonito., MD, [facility] 09/16/2017, 8:44 AM [address]  [place] Pager [phone]

## 2017-09-16 NOTE — Progress Notes (Signed)
No complaints of chest pain from patient throughout the night.

## 2017-09-16 NOTE — Progress Notes (Signed)
Received report assumed care of patient. Ambulated pt to the BR 1 person stand by assist pt alittle agitated about process post cath. Educated and questions answered.

## 2017-09-16 NOTE — Plan of Care (Signed)
  Clinical Measurements: Respiratory complications will improve 09/16/2017 2325 - Progressing by Tristan Schroeder, RN   Coping: Level of anxiety will decrease 09/16/2017 2325 - Progressing by Tristan Schroeder, RN

## 2017-09-16 NOTE — Interval H&P Note (Signed)
History and Physical Interval Note:  09/16/2017 10:44 AM  Christian [name]  has presented today for surgery, with the diagnosis of cp  The various methods of treatment have been discussed with the patient and family. After consideration of risks, benefits and other options for treatment, the patient has consented to  Procedure(s): RIGHT/LEFT HEART CATH AND CORONARY ANGIOGRAPHY (N/A) as a surgical intervention .  The patient's history has been reviewed, patient examined, no change in status, stable for surgery.  I have reviewed the patient's chart and labs.  Questions were answered to the patient's satisfaction.   Cath Lab Visit (complete for each Cath Lab visit)  Clinical Evaluation Leading to the Procedure:   ACS: Yes.    Non-ACS:    Anginal Classification: CCS III  Anti-ischemic medical therapy: Minimal Therapy (1 class of medications)  Non-Invasive Test Results: No non-invasive testing performed  Prior CABG: No previous CABG        Christian [name] [facility] 09/16/2017 10:44 AM

## 2017-09-16 NOTE — Progress Notes (Signed)
PROGRESS NOTE  Christian [name]  QIO:[id number] DOB: 1940-12-09 DOA: 09/14/2017 PCP: Janith Lima, MD   Brief Narrative: Christian [name] is a 76 y.o. male with a history of HTN, HLD, diet-controlled T2DM, and anxiety who presented to the ED 11/26 with dyspnea on exertion and chest tightness. D-dimer was elevated, CTA chest negative for pulmonary embolism.  Troponin showed mildly elevated trend 0.04 > 0.10 without ECG evidence of ischemia. BNP was elevated to 992 in the setting of creatinine of 1.23. Echocardiogram was ordered showing new-onset LV dysfunction (EF 20%). Cardiology was consulted and performed [facility] 11/28 which showed nonobstructive CAD. Recommendation is for medical therapy for systolic CHF.  Assessment & Plan: Principal Problem:   Chest pain, rule out acute myocardial infarction Active Problems:   Hyperlipidemia LDL goal <130   Essential hypertension   Type 2 diabetes mellitus with complication, without long-term current use of insulin (HCC)   Acute systolic CHF (congestive heart failure) (HCC)  Acute systolic CHF: EF 32%, diffuse hypokinesis.  - Gave beta blocker today, would benefit from ACE/ARB. Appears euvolemic.  - Treating risk factors as below. - Strict I/O, daily weights - Heart healthy diet  HTN: Chronic, stable - At inpatient goal, will monitor with titration of medications  T2DM: HbA1c 6.5% - Carb-modified diet  Hyperlipidemia: Untreated due to statin myopathy.  - Cardiology follow up for consideration of PCSK9 inhibitor.  DVT prophylaxis: Lovenox Code Status: Full Family Communication: None at bedside Disposition Plan: DC home once medically stable.   Consultants:   Cardiology  Procedures:   Echocardiogram 09/15/2017: - Left ventricle: The cavity size was mildly dilated. Wall   thickness was increased in a pattern of mild LVH. The estimated   ejection fraction was 20%. Diffuse hypokinesis. The study is not   technically sufficient to allow  evaluation of LV diastolic   function. - Aortic valve: Mildly calcified leaflets. There was no stenosis.   There was mild regurgitation. - Aorta: Ascending aorta dimension: 4.2 cm. - Ascending aorta: The ascending aorta was mildly dilated. - Mitral valve: Mildly thickened leaflets . There was mild   regurgitation. - Left atrium: The atrium was normal in size. - Tricuspid valve: There was mild regurgitation. - Pulmonic valve: There was trivial regurgitation. - Pulmonary arteries: PA peak pressure: 44 mm Hg (S). - Inferior vena cava: The vessel was normal in size. The   respirophasic diameter changes were in the normal range (>= 50%),   consistent with normal central venous pressure.  Impressions: - LVEF 20%, global hypokinesis, mildly dilated LV with mild LVH,   mild AI, dilated ascending aorta to 4.2 cm, mild MR, normal LA   size, mild TR, trivial PI, RVSP 44 mmHg, normal IVC.   Right and left heart catheterization 09/16/2017: Prox LAD lesion is 45% stenosed. 1st Diag lesion is 65% stenosed. Hemodynamic findings consistent with moderate pulmonary hypertension.   1. Nonobstructive CAD 2. Elevated LV filling pressures with PCWP mean of 29 mm Hg 3. Moderate pulmonary HTN 4. Cardiac output 3.66 L/min with index of 2.07.   Plan: optimize medical therapy for CHF  Antimicrobials:  None   Subjective: No current chest pain, denies dyspnea but has not been walking around like usual. No edema.   Objective: Vitals:   09/16/17 1235 09/16/17 1300 09/16/17 1335 09/16/17 1442  BP: 119/71 114/80 116/77 112/71  Pulse:      Resp:      Temp:      TempSrc:      SpO2:  96% 100% 96%  Weight:      Height:        Intake/Output Summary (Last 24 hours) at 09/16/2017 1512 Last data filed at 09/16/2017 1300 Gross per 24 hour  Intake 600 ml  Output 1350 ml  Net -750 ml   Filed Weights   09/15/17 0446 09/16/17 0411 09/16/17 0903  Weight: 78.6 kg (173 lb 4.8 oz) 77.6 kg (171 lb 1.6  oz) 77.6 kg (171 lb 1.2 oz)    Gen: 76 y.o. male in no distress Pulm: Non-labored breathing. Clear to auscultation bilaterally.  CV: Regular rate and rhythm. No murmur, rub, or gallop. Normal JVP, no pedal edema. GI: Abdomen soft, non-tender, non-distended, with normoactive bowel sounds. No organomegaly or masses felt. Ext: Warm, no deformities Skin: No rashes, lesions no ulcers Neuro: Alert and oriented. No focal neurological deficits. Psych: Judgement and insight appear normal. Mood & affect appropriate.   Data Reviewed: I have personally reviewed following labs and imaging studies  CBC: Recent Labs  Lab 09/14/17 1431 09/14/17 1620 09/16/17 0529  WBC 10.2 8.6 9.7  NEUTROABS 9.1*  --   --   HGB 15.2 14.9 13.5  HCT 46.5 46.7 43.0  MCV 89.6 91.2 91.9  PLT 213.0 197 073   Basic Metabolic Panel: Recent Labs  Lab 09/14/17 1620 09/16/17 0529  NA 137 139  K 4.3 4.5  CL 102 104  CO2 24 28  GLUCOSE 157* 130*  BUN 23* 24*  CREATININE 1.23 1.20  CALCIUM 8.9 8.1*   GFR: Estimated Creatinine Clearance: 45.1 mL/min (by C-G formula based on SCr of 1.2 mg/dL). Liver Function Tests: No results for input(s): AST, ALT, ALKPHOS, BILITOT, PROT, ALBUMIN in the last 168 hours. No results for input(s): LIPASE, AMYLASE in the last 168 hours. No results for input(s): AMMONIA in the last 168 hours. Coagulation Profile: Recent Labs  Lab 09/16/17 0921  INR 1.07   Cardiac Enzymes: Recent Labs  Lab 09/15/17 0405 09/15/17 0706  TROPONINI 0.09* 0.10*   BNP (last 3 results) Recent Labs    09/14/17 1431  PROBNP 992.0*   HbA1C: No results for input(s): HGBA1C in the last 72 hours. CBG: No results for input(s): GLUCAP in the last 168 hours. Lipid Profile: No results for input(s): CHOL, HDL, LDLCALC, TRIG, CHOLHDL, LDLDIRECT in the last 72 hours. Thyroid Function Tests: No results for input(s): TSH, T4TOTAL, FREET4, T3FREE, THYROIDAB in the last 72 hours. Anemia Panel: No  results for input(s): VITAMINB12, FOLATE, FERRITIN, TIBC, IRON, RETICCTPCT in the last 72 hours. Urine analysis: No results found for: COLORURINE, APPEARANCEUR, LABSPEC, PHURINE, GLUCOSEU, HGBUR, BILIRUBINUR, KETONESUR, PROTEINUR, UROBILINOGEN, NITRITE, LEUKOCYTESUR No results found for this or any previous visit (from the past 240 hour(s)).    Radiology Studies: Dg Chest 2 View  Result Date: 09/14/2017 CLINICAL DATA:  Short of breath nausea for 6 weeks EXAM: CHEST  2 VIEW COMPARISON:  None. FINDINGS: Borderline cardiomegaly. Normal vascularity. Low lung volumes. Bibasilar atelectasis. No pneumothorax or pleural effusion. IMPRESSION: Bibasilar airspace disease. Electronically Signed   By: Marybelle Killings M.D.   On: 09/14/2017 17:54   Ct Angio Chest Pe W And/or Wo Contrast  Result Date: 09/15/2017 CLINICAL DATA:  Shortness of breath EXAM: CT ANGIOGRAPHY CHEST WITH CONTRAST TECHNIQUE: Multidetector CT imaging of the chest was performed using the standard protocol during bolus administration of intravenous contrast. Multiplanar CT image reconstructions and MIPs were obtained to evaluate the vascular anatomy. CONTRAST:  131mL ISOVUE-370 IOPAMIDOL (ISOVUE-370) INJECTION 76% COMPARISON:  Chest  radiograph 09/14/2017 FINDINGS: Cardiovascular: Contrast injection is sufficient to demonstrate satisfactory opacification of the pulmonary arteries to the segmental level. There is no pulmonary embolus. The main pulmonary artery is within normal limits for size. There is no CT evidence of acute right heart strain. Mild calcific aortic atherosclerosis. There is a normal 3-vessel arch branching pattern. Heart size is normal, without pericardial effusion. Mediastinum/Nodes: No mediastinal, hilar or axillary lymphadenopathy. The visualized thyroid and thoracic esophageal course are unremarkable. Lungs/Pleura: No pulmonary nodules or masses. No pleural effusion or pneumothorax. No focal airspace consolidation. No focal  pleural abnormality. Upper Abdomen: Contrast bolus timing is not optimized for evaluation of the abdominal organs. Within this limitation, the visualized organs of the upper abdomen are normal. Musculoskeletal: No chest wall abnormality. No acute or significant osseous findings. Review of the MIP images confirms the above findings. IMPRESSION: 1. No pulmonary embolus or other acute abnormality of the chest. 2.  Aortic Atherosclerosis (ICD10-I70.0). Electronically Signed   By: Ulyses Jarred M.D.   On: 09/15/2017 02:13    Scheduled Meds: . aspirin EC  81 mg Oral Daily  . cholecalciferol  1,000 Units Oral Daily  . [START ON 09/17/2017] enoxaparin (LOVENOX) injection  40 mg Subcutaneous Q24H  . olopatadine  1 drop Both Eyes Daily  . pantoprazole  80 mg Oral Q1200  . sodium chloride flush  3 mL Intravenous Q12H   Continuous Infusions: . sodium chloride       LOS: 0 days   Time spent: 25 minutes.  Vance Gather, MD [facility] Pager [phone]  If 7PM-7AM, please contact night-coverage [email or web address] Password [email or web address] 09/16/2017, 3:12 PM

## 2017-09-17 DIAGNOSIS — I10 Essential (primary) hypertension: Secondary | ICD-10-CM | POA: Diagnosis not present

## 2017-09-17 DIAGNOSIS — R079 Chest pain, unspecified: Secondary | ICD-10-CM | POA: Diagnosis not present

## 2017-09-17 DIAGNOSIS — E118 Type 2 diabetes mellitus with unspecified complications: Secondary | ICD-10-CM

## 2017-09-17 DIAGNOSIS — I5021 Acute systolic (congestive) heart failure: Secondary | ICD-10-CM | POA: Diagnosis not present

## 2017-09-17 DIAGNOSIS — R0609 Other forms of dyspnea: Secondary | ICD-10-CM | POA: Diagnosis not present

## 2017-09-17 DIAGNOSIS — E785 Hyperlipidemia, unspecified: Secondary | ICD-10-CM | POA: Diagnosis not present

## 2017-09-17 LAB — BASIC METABOLIC PANEL
ANION GAP: 8 (ref 5–15)
BUN: 16 mg/dL (ref 6–20)
CHLORIDE: 103 mmol/L (ref 101–111)
CO2: 27 mmol/L (ref 22–32)
CREATININE: 1.03 mg/dL (ref 0.61–1.24)
Calcium: 8.1 mg/dL — ABNORMAL LOW (ref 8.9–10.3)
GFR calc Af Amer: >60 mL/min (ref >60)
GFR calc non Af Amer: >60 mL/min (ref >60)
Glucose, Bld: 141 mg/dL — ABNORMAL HIGH (ref 65–99)
Potassium: 4.2 mmol/L (ref 3.5–5.1)
Sodium: 138 mmol/L (ref 135–145)

## 2017-09-17 MED ORDER — FUROSEMIDE 40 MG PO TABS: 40.0000 mg | ORAL_TABLET | Freq: Every day | ORAL | 0 refills | Status: DC

## 2017-09-17 MED ORDER — CARVEDILOL 3.125 MG PO TABS
3.1250 mg | ORAL_TABLET | Freq: Two times a day (BID) | ORAL | Status: DC
  Administered 2017-09-17: 3.125 mg via ORAL
  Filled 2017-09-17: qty 1

## 2017-09-17 MED ORDER — FUROSEMIDE 40 MG PO TABS: 40.0000 mg | ORAL_TABLET | Freq: Every day | ORAL | Status: DC

## 2017-09-17 MED ORDER — CARVEDILOL 3.125 MG PO TABS: 3.1250 mg | ORAL_TABLET | Freq: Two times a day (BID) | ORAL | 0 refills | Status: DC

## 2017-09-17 MED ORDER — LOSARTAN POTASSIUM 25 MG PO TABS: 12.5000 mg | ORAL_TABLET | Freq: Every day | ORAL | 0 refills | Status: DC

## 2017-09-17 MED ORDER — LOSARTAN POTASSIUM 25 MG PO TABS
12.5000 mg | ORAL_TABLET | Freq: Every day | ORAL | Status: DC
  Administered 2017-09-17: 12.5 mg via ORAL
  Filled 2017-09-17: qty 1

## 2017-09-17 NOTE — Progress Notes (Signed)
SATURATION QUALIFICATIONS: (This note is used to comply with regulatory documentation for home oxygen)  Patient Saturations on Room Air at Rest = 100%  Patient Saturations on Room Air while Ambulating = 91%  Patient Saturations on n/a Liters of oxygen while Ambulating = n/a  Please briefly explain why patient needs home oxygen: patient does not qualify for home oxygen

## 2017-09-17 NOTE — Plan of Care (Signed)
Discharge instructions reviewed with patient, questions answered, verbalized understanding.  Went over heart failure booklet with patient, stressed the importance of daily weights, limiting his sodium intake, taking his medications as prescribed and following up with his providers.  Patient verbalized understanding of above.  Patient awaiting ride for discharge.

## 2017-09-17 NOTE — Plan of Care (Signed)
Patient ambulated in hallway, able to maintain oxygen saturation in the 90s on room air but does continue to get dyspneic with exertion although he says this is improved.  RN started education on CHF, heart failure booklet reviewed with patient.  Patient needs reinforcement of all education.  Patient educated on new medications started today as well.

## 2017-09-17 NOTE — Discharge Summary (Signed)
Physician Discharge Summary  Christian [name] RUE:[id number] DOB: 1940-11-28 DOA: 09/14/2017  PCP: Janith Lima, MD  Admit date: 09/14/2017 Discharge date: 09/17/2017  Admitted From: Home Disposition: Home   Recommendations for Outpatient Follow-up:  1. Follow up with PCP in 1-2 weeks 2. Follow up with cardiology, medical management of CHF.   Home Health: None Equipment/Devices: None Discharge Condition: Stable CODE STATUS: Full Diet recommendation: Heart healthy  Brief/Interim Summary: Christian [name] is a 76 y.o. male with a history of HTN, HLD, diet-controlled T2DM, and anxiety who presented to the ED 11/26 with dyspnea on exertion and chest tightness. D-dimer was elevated, CTA chest negative for pulmonary embolism. Troponin showed mildly elevated trend 0.04 > 0.10 without ECG evidence of ischemia. BNP was elevated to 992 in the setting of creatinine of 1.23. Echocardiogram was ordered showing new-onset LV dysfunction (EF 20%). Cardiology was consulted and performed [facility] 11/28 which showed nonobstructive CAD. Recommendation is for medical therapy for systolic CHF. He has overall improved, no longer requiring supplemental oxygen on exertion.  Discharge Diagnoses:  Principal Problem:   Chest pain, rule out acute myocardial infarction Active Problems:   Hyperlipidemia LDL goal <130   Essential hypertension   Type 2 diabetes mellitus with complication, without long-term current use of insulin (HCC)   Acute systolic CHF (congestive heart failure) (HCC)  Acute systolic CHF: EF 14%, diffuse hypokinesis.  - Continue coreg, losartan, lasix, potassium as below - Treating risk factors as below. - Continue checking daily weights - Heart healthy diet  HTN: Chronic, stable. Monitor at follow up.  T2DM: HbA1c 6.5% - Carb-modified diet  Hyperlipidemia: Untreated due to statin myopathy.  - Cardiology follow up for consideration of PCSK9 inhibitor.  Discharge  Instructions Discharge Instructions    (HEART FAILURE PATIENTS) Call MD:  Anytime you have any of the following symptoms: 1) 3 pound weight gain in 24 hours or 5 pounds in 1 week 2) shortness of breath, with or without a dry hacking cough 3) swelling in the hands, feet or stomach 4) if you have to sleep on extra pillows at night in order to breathe.   Complete by:  As directed    Diet - low sodium heart healthy   Complete by:  As directed    Discharge instructions   Complete by:  As directed    You were treated for new onset congestive heart failure and have improved.  - Start taking coreg, losartan, and lasix every day to help treating this.  - Follow up with your PCP and cardiology, or seek medical attention sooner if your symptoms return/worsen.     Allergies as of 09/17/2017      Reactions   Amoxicillin Hives   Bactrim [sulfamethoxazole-trimethoprim] Other (See Comments)   Dried out his eyes   Cephalexin Itching   Hydrocodone Itching   Lexapro [escitalopram Oxalate] Other (See Comments)   disorientation   Lipitor [atorvastatin] Other (See Comments)   Damage to calf muscles   Oxycodone Itching   Statins Other (See Comments)   Muscle pain   Vytorin [ezetimibe-simvastatin] Other (See Comments)   Damage to calf muscles      Medication List    TAKE these medications   aspirin EC 81 MG tablet Take 1 tablet (81 mg total) by mouth daily.   carvedilol 3.125 MG tablet Commonly known as:  COREG Take 1 tablet (3.125 mg total) by mouth 2 (two) times daily with a meal.   cholecalciferol 1000 units tablet Commonly known as:  VITAMIN D Take 1,000 Units by mouth daily.   clonazePAM 1 MG tablet Commonly known as:  KLONOPIN Take 1 tablet (1 mg total) by mouth 2 (two) times daily as needed for anxiety.   esomeprazole 40 MG capsule Commonly known as:  NEXIUM Take 40 mg by mouth daily as needed (acid reflux).   furosemide 40 MG tablet Commonly known as:  LASIX Take 1 tablet (40  mg total) by mouth daily.   losartan 25 MG tablet Commonly known as:  COZAAR Take 0.5 tablets (12.5 mg total) by mouth daily. Start taking on:  09/18/2017   olopatadine 0.1 % ophthalmic solution Commonly known as:  PATANOL Place 1 drop into both eyes daily.      Follow-up Information    Janith Lima, MD. Go on 09/28/2017.   Specialty:  Internal Medicine Why:  @3 :00pm please arrive @ 2:45pm Contact information: [address] [address] [phone]        Nahser, [name], MD Follow up.   Specialty:  Cardiology Contact information: [address] [place] [address] [phone]          Allergies  Allergen Reactions  . Amoxicillin Hives  . Bactrim [Sulfamethoxazole-Trimethoprim] Other (See Comments)    Dried out his eyes  . Cephalexin Itching  . Hydrocodone Itching  . Lexapro [Escitalopram Oxalate] Other (See Comments)    disorientation  . Lipitor [Atorvastatin] Other (See Comments)    Damage to calf muscles  . Oxycodone Itching  . Statins Other (See Comments)    Muscle pain   . Vytorin [Ezetimibe-Simvastatin] Other (See Comments)    Damage to calf muscles    Consultations:  Cardiology  Procedures/Studies: Dg Chest 2 View  Result Date: 09/14/2017 CLINICAL DATA:  Short of breath nausea for 6 weeks EXAM: CHEST  2 VIEW COMPARISON:  None. FINDINGS: Borderline cardiomegaly. Normal vascularity. Low lung volumes. Bibasilar atelectasis. No pneumothorax or pleural effusion. IMPRESSION: Bibasilar airspace disease. Electronically Signed   By: Marybelle Killings M.D.   On: 09/14/2017 17:54   Ct Angio Chest Pe W And/or Wo Contrast  Result Date: 09/15/2017 CLINICAL DATA:  Shortness of breath EXAM: CT ANGIOGRAPHY CHEST WITH CONTRAST TECHNIQUE: Multidetector CT imaging of the chest was performed using the standard protocol during bolus administration of intravenous contrast. Multiplanar CT image reconstructions and MIPs were  obtained to evaluate the vascular anatomy. CONTRAST:  121mL ISOVUE-370 IOPAMIDOL (ISOVUE-370) INJECTION 76% COMPARISON:  Chest radiograph 09/14/2017 FINDINGS: Cardiovascular: Contrast injection is sufficient to demonstrate satisfactory opacification of the pulmonary arteries to the segmental level. There is no pulmonary embolus. The main pulmonary artery is within normal limits for size. There is no CT evidence of acute right heart strain. Mild calcific aortic atherosclerosis. There is a normal 3-vessel arch branching pattern. Heart size is normal, without pericardial effusion. Mediastinum/Nodes: No mediastinal, hilar or axillary lymphadenopathy. The visualized thyroid and thoracic esophageal course are unremarkable. Lungs/Pleura: No pulmonary nodules or masses. No pleural effusion or pneumothorax. No focal airspace consolidation. No focal pleural abnormality. Upper Abdomen: Contrast bolus timing is not optimized for evaluation of the abdominal organs. Within this limitation, the visualized organs of the upper abdomen are normal. Musculoskeletal: No chest wall abnormality. No acute or significant osseous findings. Review of the MIP images confirms the above findings. IMPRESSION: 1. No pulmonary embolus or other acute abnormality of the chest. 2.  Aortic Atherosclerosis (ICD10-I70.0). Electronically Signed   By: Ulyses Jarred M.D.   On: 09/15/2017  02:13    Echocardiogram 09/15/2017: - Left ventricle: The cavity size was mildly dilated. Wall thickness was increased in a pattern of mild LVH. The estimated ejection fraction was 20%. Diffuse hypokinesis. The study is not technically sufficient to allow evaluation of LV diastolic function. - Aortic valve: Mildly calcified leaflets. There was no stenosis. There was mild regurgitation. - Aorta: Ascending aorta dimension: 4.2 cm. - Ascending aorta: The ascending aorta was mildly dilated. - Mitral valve: Mildly thickened leaflets . There was  mild regurgitation. - Left atrium: The atrium was normal in size. - Tricuspid valve: There was mild regurgitation. - Pulmonic valve: There was trivial regurgitation. - Pulmonary arteries: PA peak pressure: 44 mm Hg (S). - Inferior vena cava: The vessel was normal in size. The respirophasic diameter changes were in the normal range (>= 50%), consistent with normal central venous pressure.  Impressions: - LVEF 20%, global hypokinesis, mildly dilated LV with mild LVH, mild AI, dilated ascending aorta to 4.2 cm, mild MR, normal LA size, mild TR, trivial PI, RVSP 44 mmHg, normal IVC.   Right and left heart catheterization 09/16/2017: Prox LAD lesion is 45% stenosed. 1st Diag lesion is 65% stenosed. Hemodynamic findings consistent with moderate pulmonary hypertension.  1. Nonobstructive CAD 2. Elevated LV filling pressures with PCWP mean of 29 mm Hg 3. Moderate pulmonary HTN 4. Cardiac output 3.66 L/min with index of 2.07.   Plan: optimize medical therapy for CHF  Subjective: Dyspnea is improving, did not require oxygen with ambulation today. No chest pain. Wants to go home.   Discharge Exam: Vitals:   09/17/17 0928 09/17/17 1236  BP: 111/75 93/65  Pulse: (!) 102 62  Resp:  18  Temp:  97.9 F (36.6 C)  SpO2: 96% 94%   General: Pt is alert, awake, not in acute distress Cardiovascular: RRR, S1/S2 +, no rubs, no gallops Respiratory: CTA bilaterally, no wheezing, no rhonchi Abdominal: Soft, NT, ND, bowel sounds + Extremities: No edema, no cyanosis  Labs: Basic Metabolic Panel: Recent Labs  Lab 09/14/17 1620 09/16/17 0529 09/17/17 0741  NA 137 139 138  K 4.3 4.5 4.2  CL 102 104 103  CO2 24 28 27   GLUCOSE 157* 130* 141*  BUN 23* 24* 16  CREATININE 1.23 1.20 1.03  CALCIUM 8.9 8.1* 8.1*   CBC: Recent Labs  Lab 09/14/17 1431 09/14/17 1620 09/16/17 0529  WBC 10.2 8.6 9.7  NEUTROABS 9.1*  --   --   HGB 15.2 14.9 13.5  HCT 46.5 46.7 43.0  MCV 89.6  91.2 91.9  PLT 213.0 197 155   Cardiac Enzymes: Recent Labs  Lab 09/15/17 0405 09/15/17 0706  TROPONINI 0.09* 0.10*   D-Dimer Recent Labs    09/14/17 1431 09/14/17 1630  DDIMER 0.82* 0.87*    Time coordinating discharge: Approximately 40 minutes  Vance Gather, MD  [facility] 09/17/2017, 2:13 PM Pager [phone]

## 2017-09-17 NOTE — Progress Notes (Signed)
Progress Note  Patient Name: Christian [name] Date of Encounter: 09/17/2017  Primary Cardiologist: Dr. Acie Fredrickson  Subjective   Pt sitting up in chair, no complaints.  Inpatient Medications    Scheduled Meds: . aspirin EC  81 mg Oral Daily  . cholecalciferol  1,000 Units Oral Daily  . enoxaparin (LOVENOX) injection  40 mg Subcutaneous Q24H  . furosemide  40 mg Intravenous Daily  . olopatadine  1 drop Both Eyes Daily  . pantoprazole  80 mg Oral Q1200  . sodium chloride flush  3 mL Intravenous Q12H   Continuous Infusions: . sodium chloride     PRN Meds: sodium chloride, acetaminophen, alum & mag hydroxide-simeth, clonazePAM, ondansetron (ZOFRAN) IV, sodium chloride flush   Vital Signs    Vitals:   09/16/17 1335 09/16/17 1442 09/16/17 2007 09/17/17 0422  BP: 116/77 112/71 114/70 138/75  Pulse:   81 67  Resp:   20 20  Temp:   98.2 F (36.8 C) 98.2 F (36.8 C)  TempSrc:   Oral Oral  SpO2: 100% 96% 100% 98%  Weight:    167 lb 11.2 oz (76.1 kg)  Height:        Intake/Output Summary (Last 24 hours) at 09/17/2017 0819 Last data filed at 09/17/2017 0756 Gross per 24 hour  Intake 360 ml  Output 1000 ml  Net -640 ml   Filed Weights   09/16/17 0411 09/16/17 0903 09/17/17 0422  Weight: 171 lb 1.6 oz (77.6 kg) 171 lb 1.2 oz (77.6 kg) 167 lb 11.2 oz (76.1 kg)     Physical Exam   General: Well developed, well nourished, male appearing in no acute distress. Head: Normocephalic, atraumatic.  Neck: Supple without bruits, no JVD Lungs:  Resp regular and unlabored, CTA. Heart: RRR* S1, S2, no murmur; no rub. Abdomen: Soft, non-tender, non-distended with normoactive bowel sounds. No hepatomegaly. No rebound/guarding. No obvious abdominal masses. Extremities: No clubbing, cyanosis, no edema. Distal pedal pulses are 2+ bilaterally. Neuro: Alert and oriented X 3. Moves all extremities spontaneously. Psych: Normal affect.  Labs    Chemistry Recent Labs  Lab 09/14/17 1620  09/16/17 0529  NA 137 139  K 4.3 4.5  CL 102 104  CO2 24 28  GLUCOSE 157* 130*  BUN 23* 24*  CREATININE 1.23 1.20  CALCIUM 8.9 8.1*  GFRNONAA 55* 57*  GFRAA >60 >60  ANIONGAP 11 7     Hematology Recent Labs  Lab 09/14/17 1431 09/14/17 1620 09/16/17 0529  WBC 10.2 8.6 9.7  RBC 5.19 5.12 4.68  HGB 15.2 14.9 13.5  HCT 46.5 46.7 43.0  MCV 89.6 91.2 91.9  MCH  --  29.1 28.8  MCHC 32.6 31.9 31.4  RDW 14.9 15.2 15.3  PLT 213.0 197 155    Cardiac Enzymes Recent Labs  Lab 09/15/17 0405 09/15/17 0706  TROPONINI 0.09* 0.10*    Recent Labs  Lab 09/14/17 1656 09/15/17 0035  TROPIPOC 0.04 0.04     BNP Recent Labs  Lab 09/14/17 1431  PROBNP 992.0*     DDimer  Recent Labs  Lab 09/14/17 1431 09/14/17 1630  DDIMER 0.82* 0.87*     Radiology    No results found.   Telemetry    Sinus with PVCs - Personally Reviewed  ECG    No new tracings - Personally Reviewed   Cardiac Studies   Right and left heart cath 09/16/17:  Prox LAD lesion is 45% stenosed.  1st Diag lesion is 65% stenosed.  Hemodynamic findings consistent  with moderate pulmonary hypertension.   1. Nonobstructive CAD 2. Elevated LV filling pressures with PCWP mean of 29 mm Hg 3. Moderate pulmonary HTN 4. Cardiac output 3.66 L/min with index of 2.07.   Plan: optimize medical therapy for CHF.    Echo 09/15/17: Study Conclusions - Left ventricle: The cavity size was mildly dilated. Wall   thickness was increased in a pattern of mild LVH. The estimated   ejection fraction was 20%. Diffuse hypokinesis. The study is not   technically sufficient to allow evaluation of LV diastolic   function. - Aortic valve: Mildly calcified leaflets. There was no stenosis.   There was mild regurgitation. - Aorta: Ascending aorta dimension: 4.2 cm. - Ascending aorta: The ascending aorta was mildly dilated. - Mitral valve: Mildly thickened leaflets . There was mild   regurgitation. - Left atrium:  The atrium was normal in size. - Tricuspid valve: There was mild regurgitation. - Pulmonic valve: There was trivial regurgitation. - Pulmonary arteries: PA peak pressure: 44 mm Hg (S). - Inferior vena cava: The vessel was normal in size. The   respirophasic diameter changes were in the normal range (>= 50%),   consistent with normal central venous pressure.  Impressions: - LVEF 20%, global hypokinesis, mildly dilated LV with mild LVH,   mild AI, dilated ascending aorta to 4.2 cm, mild MR, normal LA   size, mild TR, trivial PI, RVSP 44 mmHg, normal IVC.  Patient Profile     76 y.o. male with a hx of HLD, HTN, DM, neuropathy, SOB, idiopathic inflammatory myopathy, and abdominal herniawho is being seen today for the evaluation of dyspnea.  Assessment & Plan    1. New onset systolic heart failure, nonischemic cardiomyopathy Given this patient's newly reduced EF (20%), he underwent left and right heart catheterization yesterday with nonobstructive CAD noted, elevated filling pressures, and moderate pulmonary HTN. We will optimize medical therapy for this new systolic heart failure. I discussed daily weights, blood pressure log, and avoidance of sodium. I also expressed the importance of following up with cardiology and PCP.  His heart rate has generally been in the 80-90s range. Will start low dose coreg. Serum creatinine has  Normalized: 1.03 (1.20). Given his multiple allergies, will start low dose losartan. Will transition his IV lasix to PO dosing this afternoon. K is stable at 4.2.  Continue 81 mg ASA.    2. HLD Pt states he is intolerant to statins, secondary to muscle pain and nondescript "damage to calf muscle" following lipitor. Will recommend lipid clinic appt with referral to repatha. LDL this admission was 167, HDL 68, triglycerides 109.   3. Shortness of breath His dyspnea is likely multifactorial, given his new onset systolic heart failure and anxiety problems. Will  medically optimize for NICM and refer back to PCP for continued management of his anxiety.   4. DM Has been well-controlled. A1c was 6.5%.    Signed, Ledora Bottcher , PA-C 8:19 AM 09/17/2017 Pager: [phone]  Attending Note:   The patient was seen and examined.  Agree with assessment and plan as noted above.  Changes made to the above note as needed.  Patient seen and independently examined with Doreene Adas, PA .   We discussed all aspects of the encounter. I agree with the assessment and plan as stated above.  1.  Acute on chronic systolic congestive heart failure: Heart catheterization yesterday revealed no significant obstructive coronary artery disease.  He does have elevated left ventricular filling pressures (wedge  pressure of 29)  and pulmonary hypertension -PA pressure 54/28 with a mean PA pressure of 39  He is on Lasix 40 mg a day.  Will change to p.o. Lasix. He has been started on carvedilol 3.125 mg twice a day and losartan 12.5 mg a day.  I advised him to ambulate the halls.  I anticipate that he should be able to be discharged tomorrow.  He is feeling quite a bit better.   I have spent a total of 30 minutes with patient reviewing hospital  notes , telemetry, EKGs, labs and examining patient as well as establishing an assessment and plan that was discussed with the patient. > 50% of time was spent in direct patient care.    Thayer Headings, Brooke Bonito., MD, [facility] 09/17/2017, 1:01 PM [address],  [place] Pager [phone]

## 2017-09-17 NOTE — Progress Notes (Signed)
Patient very anxious, states if his doctor does not come in and explain his medications to him by 12 noon he is checking himself out.  RN asked what medications specifically he was concerned about which patient really could not answer.  Patient says he is losing weight here and does not have weight to lose.  Patient says he is not eating here and wants to go home.  Rn attempted to educate patient on heart failure and the need for IV Lasix to help get rid of extra fluid.  Patient says he needs oxygen and Prednisone.  Patient wants to know how he can get oxygen if he leaves today and I informed him without qualifying numbers he cannot get oxygen and if he leaves against medical advice the hospital will not be able to set this up for him.    Patient states he has problems with anxiety.  Did speak to Dr. Bonner Puna regarding above and he says he will see patient before noon.

## 2017-09-18 ENCOUNTER — Telehealth: Payer: Self-pay

## 2017-09-18 NOTE — Telephone Encounter (Signed)
Pt on tcm list. Dc'ed on 09/17/2017 after admission for heart failure. Underwent right/left heart cath and coronary angiography. DC'ed to home and to follow up with PCP.   Called pt home number. Phone rang and no voicemail to leave a message.

## 2017-09-25 ENCOUNTER — Encounter: Payer: Self-pay | Admitting: Nurse Practitioner

## 2017-09-25 ENCOUNTER — Other Ambulatory Visit: Payer: Self-pay | Admitting: Internal Medicine

## 2017-09-28 ENCOUNTER — Inpatient Hospital Stay: Payer: Medicare Other | Admitting: Internal Medicine

## 2017-10-16 ENCOUNTER — Ambulatory Visit: Payer: Medicare Other | Admitting: Nurse Practitioner

## 2017-10-16 ENCOUNTER — Encounter (INDEPENDENT_AMBULATORY_CARE_PROVIDER_SITE_OTHER): Payer: Self-pay

## 2017-10-16 ENCOUNTER — Encounter: Payer: Self-pay | Admitting: Nurse Practitioner

## 2017-10-16 VITALS — BP 122/78 | HR 80 | Ht 66.0 in | Wt 170.8 lb

## 2017-10-16 DIAGNOSIS — E7849 Other hyperlipidemia: Secondary | ICD-10-CM

## 2017-10-16 DIAGNOSIS — I10 Essential (primary) hypertension: Secondary | ICD-10-CM | POA: Diagnosis not present

## 2017-10-16 DIAGNOSIS — I428 Other cardiomyopathies: Secondary | ICD-10-CM | POA: Diagnosis not present

## 2017-10-16 DIAGNOSIS — I5022 Chronic systolic (congestive) heart failure: Secondary | ICD-10-CM | POA: Diagnosis not present

## 2017-10-16 DIAGNOSIS — Z9889 Other specified postprocedural states: Secondary | ICD-10-CM

## 2017-10-16 MED ORDER — CARVEDILOL 6.25 MG PO TABS: 6.2500 mg | ORAL_TABLET | Freq: Two times a day (BID) | ORAL | 6 refills | Status: DC

## 2017-10-16 MED ORDER — FUROSEMIDE 40 MG PO TABS: 40.0000 mg | ORAL_TABLET | Freq: Every day | ORAL | 6 refills | Status: DC

## 2017-10-16 MED ORDER — LOSARTAN POTASSIUM 25 MG PO TABS: 12.5000 mg | ORAL_TABLET | Freq: Every day | ORAL | 6 refills | Status: DC

## 2017-10-16 NOTE — Progress Notes (Signed)
CARDIOLOGY OFFICE NOTE  Date:  10/16/2017    Christian [name] Date of Birth: 12-06-1940 Medical Record #[id number]  PCP:  Janith Lima, MD  Cardiologist:  Nahser (NEW)  Chief Complaint  Patient presents with  . Coronary Artery Disease  . Congestive Heart Failure    Post hospital visit - seen for Dr. Acie Fredrickson    History of Present Illness: Christian [name] is a 76 y.o. male who presents today for a post hospital visit. Seen for Dr. Acie Fredrickson.   He has a history of HTN, HLD, diet-controlled T2DM, and anxiety. No prior cardiac issues.   Presented a month ago with DOE and chest pain.  D-dimer was elevated, CTA chest negative for pulmonary embolism. Troponinshowed mildly elevated trend 0.04 > 0.10 without ECG evidence of ischemia. BNP was elevated to 992 in the setting of creatinine of 1.23. Echocardiogram was ordered showing new-onset LV dysfunction (EF 20%). Cardiology was consulted and performed [facility] 11/28 which showed nonobstructive CAD. Recommendation is for medical therapy for systolic CHF.   Comes in today. Here alone. Doing ok since discharge. Feels tired - hoping for more energy. No chest pain. Not really short of breath. If he overdoes things he will note a tightness in the chest. Not lightheaded or dizzy - very rare. No passing out. Tells me he is allergic to "everything" from prior blood transfusion. Not able to take statin. He will take prednisone (not really clear how he has gotten this) - will use "so I can walk".   Past Medical History:  Diagnosis Date  . Hernia of abdominal cavity   . Hyperlipidemia     Past Surgical History:  Procedure Laterality Date  . BACK SURGERY    . HERNIA REPAIR    . RIGHT/LEFT HEART CATH AND CORONARY ANGIOGRAPHY N/A 09/16/2017   Procedure: RIGHT/LEFT HEART CATH AND CORONARY ANGIOGRAPHY;  Surgeon: Martinique, Peter M, MD;  Location: [facility];  Service: Cardiovascular;  Laterality: N/A;  . SPINE SURGERY        Medications: Current Meds  Medication Sig  . aspirin EC 81 MG tablet Take 1 tablet (81 mg total) by mouth daily.  . cholecalciferol (VITAMIN D) 1000 units tablet Take 1,000 Units by mouth daily.  Marland Kitchen esomeprazole (NEXIUM) 40 MG capsule TAKE 1 CAPSULE BY MOUTH ONCE DAILY (Patient taking differently: TAKE 1 CAPSULE BY MOUTH ONCE as needed for heartburn)  . furosemide (LASIX) 40 MG tablet Take 1 tablet (40 mg total) by mouth daily.  Marland Kitchen losartan (COZAAR) 25 MG tablet Take 0.5 tablets (12.5 mg total) by mouth daily.  Marland Kitchen olopatadine (PATANOL) 0.1 % ophthalmic solution Place 1 drop into both eyes as needed for allergies.   . [DISCONTINUED] carvedilol (COREG) 3.125 MG tablet Take 1 tablet (3.125 mg total) by mouth 2 (two) times daily with a meal.  . [DISCONTINUED] furosemide (LASIX) 40 MG tablet Take 1 tablet (40 mg total) by mouth daily.  . [DISCONTINUED] losartan (COZAAR) 25 MG tablet Take 0.5 tablets (12.5 mg total) by mouth daily.     Allergies: Allergies  Allergen Reactions  . Amoxicillin Hives  . Bactrim [Sulfamethoxazole-Trimethoprim] Other (See Comments)    Dried out his eyes  . Cephalexin Itching  . Hydrocodone Itching  . Lexapro [Escitalopram Oxalate] Other (See Comments)    disorientation  . Lipitor [Atorvastatin] Other (See Comments)    Damage to calf muscles  . Oxycodone Itching  . Statins Other (See Comments)    Muscle pain   .  Vytorin [Ezetimibe-Simvastatin] Other (See Comments)    Damage to calf muscles    Social History: The patient  reports that  has never smoked. he has never used smokeless tobacco. He reports that he does not drink alcohol or use drugs.   Family History: The patient's family history includes Early death in his father; Hypertension in his father; Kidney disease in his father; Stroke in his father and mother.   Review of Systems: Please see the history of present illness.   Otherwise, the review of systems is positive for none.   All other  systems are reviewed and negative.   Physical Exam: VS:  BP 122/78   Pulse 80   Ht 5\' 6"  (1.676 m)   Wt 170 lb 12.8 oz (77.5 kg)   BMI 27.57 kg/m  .  BMI Body mass index is 27.57 kg/m.  Wt Readings from Last 3 Encounters:  10/16/17 170 lb 12.8 oz (77.5 kg)  09/17/17 167 lb 11.2 oz (76.1 kg)  09/14/17 177 lb (80.3 kg)    General: Pleasant. Alert and in no acute distress.   HEENT: Normal.  Neck: Supple, no JVD, carotid bruits, or masses noted.  Cardiac: Regular rate and rhythm. +S3 noted. No edema.  Respiratory:  Lungs are clear to auscultation bilaterally with normal work of breathing.  GI: Soft and nontender.  MS: No deformity or atrophy. Gait and ROM intact.  Skin: Warm and dry. Color is normal.  Neuro:  Strength and sensation are intact and no gross focal deficits noted.  Psych: Alert, appropriate and with normal affect.   LABORATORY DATA:  EKG:  EKG is not ordered today.  Lab Results  Component Value Date   WBC 9.7 09/16/2017   HGB 13.5 09/16/2017   HCT 43.0 09/16/2017   PLT 155 09/16/2017   GLUCOSE 141 (H) 09/17/2017   CHOL 258 (H) 07/08/2017   TRIG 109.0 07/08/2017   HDL 68.70 07/08/2017   LDLCALC 167 (H) 07/08/2017   ALT 16 07/08/2017   AST 15 07/08/2017   NA 138 09/17/2017   K 4.2 09/17/2017   CL 103 09/17/2017   CREATININE 1.03 09/17/2017   BUN 16 09/17/2017   CO2 27 09/17/2017   TSH 1.22 07/08/2017   INR 1.07 09/16/2017   HGBA1C 6.5 07/08/2017     BNP (last 3 results) No results for input(s): BNP in the last 8760 hours.  ProBNP (last 3 results) Recent Labs    09/14/17 1431  PROBNP 992.0*     Other Studies Reviewed Today: Procedures   RIGHT/LEFT HEART CATH AND CORONARY ANGIOGRAPHY 08/2017  Conclusion     Prox LAD lesion is 45% stenosed.  1st Diag lesion is 65% stenosed.  Hemodynamic findings consistent with moderate pulmonary hypertension.   1. Nonobstructive CAD 2. Elevated LV filling pressures with PCWP mean of 29 mm  Hg 3. Moderate pulmonary HTN 4. Cardiac output 3.66 L/min with index of 2.07.   Plan: optimize medical therapy for CHF.       Assessment/Plan:  1. Chest pain - nonobstructive CAD on cath - needs CV risk factor modification  2. LV dysfunction/chronic systolic HF - EF is 17% - increasing his Coreg today - explained that the goal is to uptitrate his medicines over the course of the next 3 months and then repeat the echo. Lab today. Reminded of salt restriction and continued daily weights.   3. HTN - increasing Coreg today.   4. HLD - untreated due to statin myopathy - needs  referral to lipid clinic for consideration of PCSK9 therapy  5. DM - per PCP  6. Chronic back pain - he will use prednisone sporadically.    Current medicines are reviewed with the patient today.  The patient does not have concerns regarding medicines other than what has been noted above.  The following changes have been made:  See above.  Labs/ tests ordered today include:    Orders Placed This Encounter  Procedures  . Basic metabolic panel     Disposition:   FU with Dr. Jana Hakim in about 3 weeks. Lab today.   Patient is agreeable to this plan and will call if any problems develop in the interim.   SignedTruitt Merle, NP  10/16/2017 3:49 PM  [facility] [address] [place], [place]  [address] Phone: [phone] Fax: [phone]

## 2017-10-16 NOTE — Patient Instructions (Addendum)
We will be checking the following labs today - BMET  Medication Instructions:    Continue with your current medicines. BUT  I am increasing the Coreg to 6.25 mg twice a day - this has been sent to your drug store - start tomorrow.   Do not take the Coreg 3.125 mg tablets.     Testing/Procedures To Be Arranged:  N/A  Follow-Up:   See Dr. Jana Hakim in about 3 weeks.    Referral to the lipid clinic to discuss PCSK9 therapy - not urgent    Other Special Instructions:   N/A    If you need a refill on your cardiac medications before your next appointment, please call your pharmacy.   Call the [place] office at [phone] if you have any questions, problems or concerns.

## 2017-10-17 LAB — BASIC METABOLIC PANEL
BUN/Creatinine Ratio: 14 (ref 10–24)
BUN: 18 mg/dL (ref 8–27)
CO2: 24 mmol/L (ref 20–29)
Calcium: 9 mg/dL (ref 8.6–10.2)
Chloride: 100 mmol/L (ref 96–106)
Creatinine, Ser: 1.26 mg/dL (ref 0.76–1.27)
GFR calc Af Amer: 63 mL/min/{1.73_m2} (ref >59)
GFR calc non Af Amer: 55 mL/min/{1.73_m2} — ABNORMAL LOW (ref >59)
Glucose: 105 mg/dL — ABNORMAL HIGH (ref 65–99)
Potassium: 4.2 mmol/L (ref 3.5–5.2)
Sodium: 140 mmol/L (ref 134–144)

## 2017-11-11 ENCOUNTER — Encounter (INDEPENDENT_AMBULATORY_CARE_PROVIDER_SITE_OTHER): Payer: Self-pay

## 2017-11-11 ENCOUNTER — Ambulatory Visit: Payer: Medicare Other | Admitting: Physician Assistant

## 2017-11-11 ENCOUNTER — Encounter: Payer: Self-pay | Admitting: Physician Assistant

## 2017-11-11 VITALS — BP 130/80 | HR 76 | Ht 66.0 in | Wt 169.8 lb

## 2017-11-11 DIAGNOSIS — I10 Essential (primary) hypertension: Secondary | ICD-10-CM | POA: Diagnosis not present

## 2017-11-11 DIAGNOSIS — I428 Other cardiomyopathies: Secondary | ICD-10-CM

## 2017-11-11 DIAGNOSIS — I2510 Atherosclerotic heart disease of native coronary artery without angina pectoris: Secondary | ICD-10-CM | POA: Insufficient documentation

## 2017-11-11 DIAGNOSIS — E785 Hyperlipidemia, unspecified: Secondary | ICD-10-CM

## 2017-11-11 DIAGNOSIS — I5022 Chronic systolic (congestive) heart failure: Secondary | ICD-10-CM | POA: Diagnosis not present

## 2017-11-11 HISTORY — DX: Other cardiomyopathies: I42.8

## 2017-11-11 HISTORY — DX: Atherosclerotic heart disease of native coronary artery without angina pectoris: I25.10

## 2017-11-11 MED ORDER — LOSARTAN POTASSIUM 25 MG PO TABS: 25.0000 mg | ORAL_TABLET | Freq: Every day | ORAL | 3 refills | Status: DC

## 2017-11-11 NOTE — Progress Notes (Signed)
Cardiology Office Note:    Date:  11/11/2017   ID:  Christian [name], DOB 1941-03-10, MRN [id number]  PCP:  Christian Lima, MD  Cardiologist:  Christian Moores, MD   Referring MD: Christian Lima, MD   Chief Complaint  Patient presents with  . Follow-up    CHF    History of Present Illness:    Christian [name] is a 77 y.o. male with a hx of diabetes, hypertension, hyperlipidemia, neuropathy, idiopathic inflammatory myopathy.  He was admitted in November 2018 for new onset systolic heart failure with an EF of 20%.  Cardiac catheterization demonstrated nonobstructive coronary artery disease consistent with nonischemic cardiomyopathy.  He was last seen in our office by Christian Merle, NP 10/16/18.    Christian [name] returns for further titration of his therapy for heart failure.  He is here alone.  He has been doing well without chest pain or significant shortness of breath.  He does get tired when he does more activity.  He denies weight gain, orthopnea, paroxysmal nocturnal dyspnea, edema, cough or wheezing.    Prior CV studies:   The following studies were reviewed today:  Right/left cardiac catheterization 09/16/17 LAD proximal 45, ostial D1 65 Mean RA 11, PASP 54, mean PA 39 LVEDP 31, mean wedge 29 1. Nonobstructive CAD 2. Elevated LV filling pressures with PCWP mean of 29 mm Hg 3. Moderate pulmonary HTN 4. Cardiac output 3.66 L/min with index of 2.07.   Echocardiogram 09/15/17 Mild LVH, EF 20, diffuse HK mild AI, a sending aorta 42 mm (mildly dilated), mild MR, mild TR, trivial PI, PASP 44  Past Medical History:  Diagnosis Date  . Chronic systolic heart failure ([place])    Echo 11/18: Mild LVH, EF 20, diffuse HK mild AI, a sending aorta 42 mm (mildly dilated), mild MR, mild TR, trivial PI, PASP 44 // R/L heart cath 11/18: Nonobstructive CAD, elevated PCWP (mean 29), moderate pulmonary hypertension, CO 3.66, CI 2.07  . Coronary artery disease involving native coronary artery of native heart  without angina pectoris 11/11/2017   LHC 11/18: pLAD 45, oD1 65  . Hernia of abdominal cavity   . Hyperlipidemia   . Nonischemic cardiomyopathy ([place]) 11/11/2017   LHC 09/16/17-nonobstructive CAD    Past Surgical History:  Procedure Laterality Date  . BACK SURGERY    . HERNIA REPAIR    . RIGHT/LEFT HEART CATH AND CORONARY ANGIOGRAPHY N/A 09/16/2017   Procedure: RIGHT/LEFT HEART CATH AND CORONARY ANGIOGRAPHY;  Surgeon: [name], Christian M, MD;  Location: [facility];  Service: Cardiovascular;  Laterality: N/A;  . SPINE SURGERY      Current Medications: Current Meds  Medication Sig  . aspirin EC 81 MG tablet Take 1 tablet (81 mg total) by mouth daily.  . carvedilol (COREG) 6.25 MG tablet Take 1 tablet (6.25 mg total) by mouth 2 (two) times daily.  . cholecalciferol (VITAMIN D) 1000 units tablet Take 1,000 Units by mouth daily.  Marland Kitchen esomeprazole (NEXIUM) 40 MG capsule Take 40 mg by mouth as needed (for heartburn).  . furosemide (LASIX) 40 MG tablet Take 1 tablet (40 mg total) by mouth daily.  Marland Kitchen olopatadine (PATANOL) 0.1 % ophthalmic solution Place 1 drop into both eyes as needed for allergies.   . [DISCONTINUED] losartan (COZAAR) 25 MG tablet Take 0.5 tablets (12.5 mg total) by mouth daily.     Allergies:   Amoxicillin; Bactrim [sulfamethoxazole-trimethoprim]; Cephalexin; Hydrocodone; Lexapro [escitalopram oxalate]; Lipitor [atorvastatin]; Oxycodone; Statins; and Vytorin [ezetimibe-simvastatin]   Social  History   Tobacco Use  . Smoking status: Never Smoker  . Smokeless tobacco: Never Used  Substance Use Topics  . Alcohol use: No  . Drug use: No     Family Hx: The patient's family history includes Early death in his father; Hypertension in his father; Kidney disease in his father; Stroke in his father and mother. There is no history of Cancer, Alcohol abuse, Diabetes, Heart disease, or Hyperlipidemia.  ROS:   Please see the history of present illness.    Review of Systems    Skin: Positive for rash.  Musculoskeletal: Positive for joint pain.   All other systems reviewed and are negative.   EKGs/Labs/Other Test Reviewed:    EKG:  EKG is   ordered today.  The ekg ordered today demonstrates normal sinus rhythm, HR 76, leftward axis, nonspecific ST-T wave changes, QTC 434 ms, no change from prior tracing  Recent Labs: 07/08/2017: ALT 16; TSH 1.22 09/14/2017: Pro B Natriuretic peptide (BNP) 992.0 09/16/2017: Hemoglobin 13.5; Platelets 155 10/16/2017: BUN 18; Creatinine, Ser 1.26; Potassium 4.2; Sodium 140   Recent Lipid Panel Lab Results  Component Value Date/Time   CHOL 258 (H) 07/08/2017 12:12 PM   TRIG 109.0 07/08/2017 12:12 PM   HDL 68.70 07/08/2017 12:12 PM   CHOLHDL 4 07/08/2017 12:12 PM   LDLCALC 167 (H) 07/08/2017 12:12 PM    Physical Exam:    VS:  BP 130/80   Pulse 76   Ht 5\' 6"  (1.676 [name])   Wt 169 lb 12.8 oz (77 kg)   SpO2 95%   BMI 27.41 kg/[name]     Wt Readings from Last 3 Encounters:  11/11/17 169 lb 12.8 oz (77 kg)  10/16/17 170 lb 12.8 oz (77.5 kg)  09/17/17 167 lb 11.2 oz (76.1 kg)     Physical Exam  Constitutional: He is oriented to person, place, and time. He appears well-developed and well-nourished. No distress.  HENT:  Head: Normocephalic and atraumatic.  Eyes: No scleral icterus.  Neck: No JVD present.  Cardiovascular: Normal rate and regular rhythm.  No murmur heard. Pulmonary/Chest: Effort normal. He has no rales.  Abdominal: Soft.  Musculoskeletal: He exhibits no edema.  Neurological: He is alert and oriented to person, place, and time.  Skin: Skin is warm and dry.    ASSESSMENT:    1. Chronic systolic heart failure ([place])   2. Nonischemic cardiomyopathy ([place])   3. Coronary artery disease involving native coronary artery of native heart without angina pectoris   4. Essential hypertension   5. Hyperlipidemia, unspecified hyperlipidemia type    PLAN:    In order of problems listed above:  1. Chronic  systolic heart failure ([place]) EF 20 by echo in 08/2017.  He is NYHA 2.  He is currently on beta-blocker and angiotensin receptor blocker.  His blood pressure can certainly tolerate Entresto.  We will see if his insurance will cover it for him first.  If so, we change change Losartan to Entresto 24/26 mg Twice daily.  For now, will increase Losartan to 25 mg Once daily.  Continue current dose of Carvedilol.  He is unable to return in 3 weeks for further med titration and prefers to follow up again on the day he returns for his relook Echo.  Therefore, will arrange follow up with Dr. Liam Rogers at that time.    2. Nonischemic cardiomyopathy ([place]) Arrange follow up limited echo last week of Feb or 1st week of March.  Will have him  see Dr. Acie Fredrickson that day.  If EF still < 35, will need to discuss +/- referral to EP to consider ICD.  3. Coronary artery disease involving native coronary artery of native heart without angina pectoris No angina.  Continue ASA.  He is intol to statins.  4. Essential hypertension The patient's blood pressure is controlled on his current regimen.  Continue current therapy.    5. Hyperlipidemia, unspecified hyperlipidemia type He is intol to statins.  He has had longstanding myalgias.  He has a hx of spinal fracture in the 1960s followed by spine surgery.  I have asked him to follow up with his PCP to ensure other causes for his leg pain are eliminated. He notes this has been investigated in the past.  He has an appointment with our Lipid clinic in Feb.  Will change this to the day he returns for his Echo and to see Dr. Acie Fredrickson.   Dispo:  Return in about 8 weeks (around 01/06/2018) for Routine Follow Up, Follow up after testing, w/ Dr. Acie Fredrickson.   Medication Adjustments/Labs and Tests Ordered: Current medicines are reviewed at length with the patient today.  Concerns regarding medicines are outlined above.  Tests Ordered: Orders Placed This Encounter  Procedures  . EKG 12-Lead   . ECHOCARDIOGRAM LIMITED   Medication Changes: Meds ordered this encounter  Medications  . losartan (COZAAR) 25 MG tablet    Sig: Take 1 tablet (25 mg total) by mouth daily.    Dispense:  90 tablet    Refill:  3    Signed, Richardson Dopp, PA-C  11/11/2017 12:03 PM    [facility], [place], [place]  [address] Phone: [phone]; Fax: [phone]

## 2017-11-11 NOTE — Patient Instructions (Signed)
Medication Instructions:  1. INCREASE LOSARTAN TO 25 MG 1 WHOLE TABLET ONCE DAILY  Labwork: NONE ORDERED TODAY  Testing/Procedures: 1. Your physician has requested that you have a LIMITED echocardiogram. Echocardiography is a painless test that uses sound waves to create images of your heart. It provides your doctor with information about the size and shape of your heart and how well your heart's chambers and valves are working. This procedure takes approximately one hour. There are no restrictions for this procedure. THIS HAS BEEN SCHEDULED FOR 12/18/17 @ 8:30 AM PLEASE ARRIVE 15 MINUTES EARLY FOR REGISTRATION    Follow-Up: 1. YOU HAVE AN APPT WITH THE LIPID CLINIC 12/18/17 @ 9:30 AM  2. YOU HAVE AN APPT WITH DR. Acie Fredrickson 12/18/17 @ 11:40 AM  Any Other Special Instructions Will Be Listed Below (If Applicable).     If you need a refill on your cardiac medications before your next appointment, please call your pharmacy.

## 2017-11-18 ENCOUNTER — Telehealth: Payer: Self-pay | Admitting: *Deleted

## 2017-11-18 NOTE — Telephone Encounter (Signed)
-----   Message from [place], LPN sent at 9/37/1696 12:48 PM EST ----- Regarding: RE: [facility] Arbie Cookey,  The pt should contact his insurance company for this information as I will only be caught in the middle between the pt and his insurance company. Once a RX is sent in Im more that happy to do a PA if needed. Thanks, Jeani Hawking ----- Message ----- From: Michae Kava, CMA Sent: 11/11/2017  11:36 AM To: Deliah [place] Via, LPN Subject: Delene Loll COVERAGE                              Hi Joanette Gula girly, pt saw Richardson Dopp, PA today. Scott asked if we could check on insurance coverage for Entresto 24/26 mg for the pt. Pt did not want to make change over to [facility] until he finds out how much it will probably cost him. Do you mind checking on this for the pt and letting him know. I do appreciate your help in this matter.  Thank you Arbie Cookey

## 2017-11-18 NOTE — Telephone Encounter (Signed)
Left message pt would need to call his [employer] as to find out what his cost of Delene Loll would be. I clarified this with our Auth Dept in our office as well as the Pharmacist at [facility].

## 2017-11-19 ENCOUNTER — Telehealth: Payer: Self-pay | Admitting: Physician Assistant

## 2017-11-19 NOTE — Telephone Encounter (Signed)
New Message   Patient is calling back to just inform that the Delene Loll was approved by insurance, No call back needed.

## 2017-11-19 NOTE — Telephone Encounter (Signed)
[facility] ) is calling to get the name of a medication to see if it would be approve by [facility] so that Christian [name] can get it . Please call.

## 2017-11-30 ENCOUNTER — Encounter: Payer: Self-pay | Admitting: Family

## 2017-11-30 ENCOUNTER — Other Ambulatory Visit (INDEPENDENT_AMBULATORY_CARE_PROVIDER_SITE_OTHER): Payer: Medicare Other

## 2017-11-30 ENCOUNTER — Ambulatory Visit (INDEPENDENT_AMBULATORY_CARE_PROVIDER_SITE_OTHER): Payer: Medicare Other | Admitting: Family

## 2017-11-30 VITALS — BP 128/82 | HR 87 | Temp 97.5°F | Ht 66.0 in | Wt 170.1 lb

## 2017-11-30 DIAGNOSIS — R739 Hyperglycemia, unspecified: Secondary | ICD-10-CM | POA: Diagnosis not present

## 2017-11-30 DIAGNOSIS — K1370 Unspecified lesions of oral mucosa: Secondary | ICD-10-CM

## 2017-11-30 LAB — BASIC METABOLIC PANEL
BUN: 23 mg/dL (ref 6–23)
CALCIUM: 9.1 mg/dL (ref 8.4–10.5)
CO2: 28 meq/L (ref 19–32)
CREATININE: 1 mg/dL (ref 0.40–1.50)
Chloride: 102 mEq/L (ref 96–112)
GFR: 76.92 mL/min (ref >60.00)
GLUCOSE: 113 mg/dL — AB (ref 70–99)
Potassium: 4.1 mEq/L (ref 3.5–5.1)
SODIUM: 139 meq/L (ref 135–145)

## 2017-11-30 LAB — HEMOGLOBIN A1C: Hgb A1c MFr Bld: 6.3 % (ref 4.6–6.5)

## 2017-11-30 MED ORDER — DOXYCYCLINE HYCLATE 100 MG PO TABS: 100.0000 mg | ORAL_TABLET | Freq: Two times a day (BID) | ORAL | 0 refills | Status: DC

## 2017-11-30 MED ORDER — DOXYCYCLINE HYCLATE 100 MG PO TABS
100.0000 mg | ORAL_TABLET | Freq: Two times a day (BID) | ORAL | 0 refills | Status: DC
Start: 2017-11-30 — End: 2017-11-30

## 2017-11-30 NOTE — Progress Notes (Signed)
Christian [name] is a 77 y.o. male with the following history as recorded in EpicCare:  Patient Active Problem List   Diagnosis Date Noted  . Nonischemic cardiomyopathy ([place]) 11/11/2017  . Coronary artery disease involving native coronary artery of native heart without angina pectoris 11/11/2017  . Chronic systolic heart failure ([place])   . Essential hypertension 07/08/2017  . Depression with anxiety 07/08/2017  . Type 2 diabetes mellitus with complication, without long-term current use of insulin ([place]) 07/08/2017  . Hyperlipidemia LDL goal <130 05/25/2017  . Neuropathy involving both lower extremities 05/25/2017  . Idiopathic inflammatory myopathy 05/25/2017    Current Outpatient Medications  Medication Sig Dispense Refill  . aspirin EC 81 MG tablet Take 1 tablet (81 mg total) by mouth daily. 90 tablet 3  . carvedilol (COREG) 6.25 MG tablet Take 1 tablet (6.25 mg total) by mouth 2 (two) times daily. 60 tablet 6  . cholecalciferol (VITAMIN D) 1000 units tablet Take 1,000 Units by mouth daily.    Marland Kitchen esomeprazole (NEXIUM) 40 MG capsule Take 40 mg by mouth as needed (for heartburn).    . furosemide (LASIX) 40 MG tablet Take 1 tablet (40 mg total) by mouth daily. 30 tablet 6  . losartan (COZAAR) 25 MG tablet Take 1 tablet (25 mg total) by mouth daily. 90 tablet 3  . olopatadine (PATANOL) 0.1 % ophthalmic solution Place 1 drop into both eyes as needed for allergies.     Marland Kitchen doxycycline (VIBRA-TABS) 100 MG tablet Take 1 tablet (100 mg total) by mouth 2 (two) times daily. 28 tablet 0   No current facility-administered medications for this visit.     Allergies: Amoxicillin; Bactrim [sulfamethoxazole-trimethoprim]; Cephalexin; Hydrocodone; Lexapro [escitalopram oxalate]; Lipitor [atorvastatin]; Oxycodone; Statins; and Vytorin [ezetimibe-simvastatin]  Past Medical History:  Diagnosis Date  . Chronic systolic heart failure ([place])    Echo 11/18: Mild LVH, EF 20, diffuse HK mild AI, a sending aorta 42 mm  (mildly dilated), mild MR, mild TR, trivial PI, PASP 44 // R/L heart cath 11/18: Nonobstructive CAD, elevated PCWP (mean 29), moderate pulmonary hypertension, CO 3.66, CI 2.07  . Coronary artery disease involving native coronary artery of native heart without angina pectoris 11/11/2017   LHC 11/18: pLAD 45, oD1 65  . Hernia of abdominal cavity   . Hyperlipidemia   . Nonischemic cardiomyopathy ([place]) 11/11/2017   LHC 09/16/17-nonobstructive CAD    Past Surgical History:  Procedure Laterality Date  . BACK SURGERY    . HERNIA REPAIR    . RIGHT/LEFT HEART CATH AND CORONARY ANGIOGRAPHY N/A 09/16/2017   Procedure: RIGHT/LEFT HEART CATH AND CORONARY ANGIOGRAPHY;  Surgeon: Martinique, Peter M, MD;  Location: [facility];  Service: Cardiovascular;  Laterality: N/A;  . SPINE SURGERY      Family History  Problem Relation Age of Onset  . Stroke Mother   . Kidney disease Father   . Stroke Father   . Hypertension Father   . Early death Father   . Cancer Neg Hx   . Alcohol abuse Neg Hx   . Diabetes Neg Hx   . Heart disease Neg Hx   . Hyperlipidemia Neg Hx     Social History   Tobacco Use  . Smoking status: Never Smoker  . Smokeless tobacco: Never Used  Substance Use Topics  . Alcohol use: No    Subjective:  Patient presents with concerns for infection of gums; notes he is up to date on his dental care but gets these oral infections occasionally;  asking for prescription for Doxycycline; Also needs to get his labs checked to follow-up on recent diagnosis of Type 2 Diabetes; currently diet controlled; last glucose in 09/2017 was at 105; denies any urinary symptoms or excessive thirst.  Objective:  Vitals:   11/30/17 1402  BP: 128/82  Pulse: 87  Temp: (!) 97.5 F (36.4 C)  TempSrc: Oral  SpO2: 98%  Weight: 170 lb 1.9 oz (77.2 kg)  Height: 5\' 6"  (1.676 m)    General: Well developed, well nourished, in no acute distress  Skin : Warm and dry.  Head: Normocephalic and atraumatic   Eyes: Sclera and conjunctiva clear; pupils round and reactive to light; extraocular movements intact  Ears: External normal; canals clear; tympanic membranes normal  Oropharynx: Pink, supple. No suspicious lesions; white lesion noted in upper left gum c/w ulcerated area Neck: Supple without thyromegaly, adenopathy  Lungs: Respirations unlabored; clear to auscultation bilaterally without wheeze, rales, rhonchi  CVS exam: normal rate and regular rhythm.  Neurologic: Alert and oriented; speech intact; face symmetrical; moves all extremities well; CNII-XII intact without focal deficit   Assessment:  1. Oral mucosal lesion   2. Elevated blood sugar     Plan:  1. Treat with Doxycycline as requested; if symptoms persist, he needs to follow-up with his dentist; patient in agreement; 2. Check BMP, Hgba1c today; follow-up with Dr. Ronnald Ramp in 2-3 months ( after upcoming cardiology evaluation completed for cardiomyopathy).  No Follow-up on file.  Orders Placed This Encounter  Procedures  . Basic Metabolic Panel (BMET)    Standing Status:   Future    Number of Occurrences:   1    Standing Expiration Date:   11/30/2018  . HgB A1c    Standing Status:   Future    Number of Occurrences:   1    Standing Expiration Date:   11/30/2018    Requested Prescriptions   Signed Prescriptions Disp Refills  . doxycycline (VIBRA-TABS) 100 MG tablet 28 tablet 0    Sig: Take 1 tablet (100 mg total) by mouth 2 (two) times daily.

## 2017-12-02 ENCOUNTER — Ambulatory Visit: Payer: Medicare Other

## 2017-12-02 ENCOUNTER — Telehealth: Payer: Self-pay | Admitting: Internal Medicine

## 2017-12-02 NOTE — Telephone Encounter (Signed)
Pt given results per notes of Christian [name] on 12/02/17 Unable to document in result note due to result note not being routed to [facility]. Pt. Verbalizes understanding.

## 2017-12-18 ENCOUNTER — Ambulatory Visit (INDEPENDENT_AMBULATORY_CARE_PROVIDER_SITE_OTHER): Payer: Medicare Other | Admitting: Pharmacist

## 2017-12-18 ENCOUNTER — Ambulatory Visit: Payer: Medicare Other | Admitting: Cardiovascular Disease

## 2017-12-18 ENCOUNTER — Other Ambulatory Visit: Payer: Self-pay

## 2017-12-18 ENCOUNTER — Encounter: Payer: Self-pay | Admitting: Cardiovascular Disease

## 2017-12-18 ENCOUNTER — Encounter: Payer: Self-pay | Admitting: Pharmacist

## 2017-12-18 ENCOUNTER — Ambulatory Visit (HOSPITAL_COMMUNITY): Payer: Medicare Other | Attending: Cardiology

## 2017-12-18 VITALS — BP 128/86 | HR 72 | Ht 66.0 in | Wt 170.2 lb

## 2017-12-18 DIAGNOSIS — I059 Rheumatic mitral valve disease, unspecified: Secondary | ICD-10-CM | POA: Diagnosis not present

## 2017-12-18 DIAGNOSIS — I5022 Chronic systolic (congestive) heart failure: Secondary | ICD-10-CM | POA: Diagnosis not present

## 2017-12-18 DIAGNOSIS — I2510 Atherosclerotic heart disease of native coronary artery without angina pectoris: Secondary | ICD-10-CM | POA: Insufficient documentation

## 2017-12-18 DIAGNOSIS — I110 Hypertensive heart disease with heart failure: Secondary | ICD-10-CM | POA: Diagnosis not present

## 2017-12-18 DIAGNOSIS — E119 Type 2 diabetes mellitus without complications: Secondary | ICD-10-CM | POA: Diagnosis not present

## 2017-12-18 DIAGNOSIS — I428 Other cardiomyopathies: Secondary | ICD-10-CM | POA: Diagnosis not present

## 2017-12-18 DIAGNOSIS — I509 Heart failure, unspecified: Secondary | ICD-10-CM | POA: Insufficient documentation

## 2017-12-18 DIAGNOSIS — I77810 Thoracic aortic ectasia: Secondary | ICD-10-CM | POA: Diagnosis not present

## 2017-12-18 DIAGNOSIS — E785 Hyperlipidemia, unspecified: Secondary | ICD-10-CM

## 2017-12-18 DIAGNOSIS — E782 Mixed hyperlipidemia: Secondary | ICD-10-CM | POA: Diagnosis not present

## 2017-12-18 MED ORDER — PERFLUTREN LIPID MICROSPHERE
1.0000 mL | INTRAVENOUS | Status: AC | PRN
  Administered 2017-12-18: 2 mL via INTRAVENOUS

## 2017-12-18 MED ORDER — SACUBITRIL-VALSARTAN 24-26 MG PO TABS: 1.0000 | ORAL_TABLET | Freq: Two times a day (BID) | ORAL | 3 refills | Status: DC

## 2017-12-18 NOTE — Patient Instructions (Addendum)
Medication Instructions:  Your physician has recommended you make the following change in your medication:  STOP Losartan (Cozaar) START Entresto (Sacubitril-Valsartan) 24-26 mg twice daily   Labwork: Your physician recommends that you return for lab work in: 3 weeks for basic metabolic panel   Testing/Procedures: None Ordered   Follow-Up: Your physician recommends that you schedule a follow-up appointment in: 2 months with Richardson Dopp, PA   If you need a refill on your cardiac medications before your next appointment, please call your pharmacy.   Thank you for choosing [facility]! Christen Bame, RN [phone]

## 2017-12-18 NOTE — Progress Notes (Signed)
Cardiology Office Note:    Date:  12/18/2017   ID:  Christian [name], DOB 05/29/1941, MRN [id number]  PCP:  Christian Lima, MD  Cardiologist:  Christian Moores, MD   Referring MD: Christian Lima, MD   Problem list  1.  Hypertension 2.  Hyperlipidemia 3.  Diabetes mellitus 4.  Chronic systolic congestive heart failure -  EF 20% in Nov. 2018  Normal coronaries 5.  Myopathy - ? Due to statins    Chief Complaint  Patient presents with  . Congestive Heart Failure  . Hyperlipidemia     December 18, 2017    Christian [name] is a 77 y.o. male with a hx of congestive heart failure, hypertension, hyperlipidemia.  Met him in the hospital in Nov. 2018.  Was found to have CHF - EF 20%. No CP  Hyperlipidemia  - took statins in the rermote past ,  Has prolonged muscle pains in response to statins  Has been taking prednisone on occasion Has been on Coreg and Losartan   Past Medical History:  Diagnosis Date  . Chronic systolic heart failure ([place])    Echo 11/18: Mild LVH, EF 20, diffuse HK mild AI, a sending aorta 42 mm (mildly dilated), mild MR, mild TR, trivial PI, PASP 44 // R/L heart cath 11/18: Nonobstructive CAD, elevated PCWP (mean 29), moderate pulmonary hypertension, CO 3.66, CI 2.07  . Coronary artery disease involving native coronary artery of native heart without angina pectoris 11/11/2017   LHC 11/18: pLAD 45, oD1 65  . Hernia of abdominal cavity   . Hyperlipidemia   . Nonischemic cardiomyopathy ([place]) 11/11/2017   LHC 09/16/17-nonobstructive CAD    Past Surgical History:  Procedure Laterality Date  . BACK SURGERY    . HERNIA REPAIR    . RIGHT/LEFT HEART CATH AND CORONARY ANGIOGRAPHY N/A 09/16/2017   Procedure: RIGHT/LEFT HEART CATH AND CORONARY ANGIOGRAPHY;  Surgeon: Martinique, Peter M, MD;  Location: [facility];  Service: Cardiovascular;  Laterality: N/A;  . SPINE SURGERY      Current Medications: Current Meds  Medication Sig  . aspirin EC 81 MG tablet Take 1 tablet (81  mg total) by mouth daily.  . carvedilol (COREG) 6.25 MG tablet Take 1 tablet (6.25 mg total) by mouth 2 (two) times daily.  . cholecalciferol (VITAMIN D) 1000 units tablet Take 1,000 Units by mouth daily.  Marland Kitchen doxycycline (VIBRA-TABS) 100 MG tablet Take 1 tablet (100 mg total) by mouth 2 (two) times daily.  Marland Kitchen esomeprazole (NEXIUM) 40 MG capsule Take 40 mg by mouth as needed (for heartburn).  . furosemide (LASIX) 40 MG tablet Take 1 tablet (40 mg total) by mouth daily.  Marland Kitchen losartan (COZAAR) 25 MG tablet Take 1 tablet (25 mg total) by mouth daily.  Marland Kitchen olopatadine (PATANOL) 0.1 % ophthalmic solution Place 1 drop into both eyes as needed for allergies.      Allergies:   Amoxicillin; Bactrim [sulfamethoxazole-trimethoprim]; Cephalexin; Hydrocodone; Lexapro [escitalopram oxalate]; Lipitor [atorvastatin]; Oxycodone; Statins; and Vytorin [ezetimibe-simvastatin]   Social History   Socioeconomic History  . Marital status: Single    Spouse name: None  . Number of children: None  . Years of education: None  . Highest education level: None  Social Needs  . Financial resource strain: None  . Food insecurity - worry: None  . Food insecurity - inability: None  . Transportation needs - medical: None  . Transportation needs - non-medical: None  Occupational History  . None  Tobacco Use  .  Smoking status: Never Smoker  . Smokeless tobacco: Never Used  Substance and Sexual Activity  . Alcohol use: No  . Drug use: No  . Sexual activity: Not Currently  Other Topics Concern  . None  Social History Narrative  . None     Family History: The patient's family history includes Early death in his father; Hypertension in his father; Kidney disease in his father; Stroke in his father and mother. There is no history of Cancer, Alcohol abuse, Diabetes, Heart disease, or Hyperlipidemia.  ROS:   Please see the history of present illness.     All other systems reviewed and are negative.  EKGs/Labs/Other  Studies Reviewed:    The following studies were reviewed today:   EKG:    Recent Labs: 07/08/2017: ALT 16; TSH 1.22 09/14/2017: Pro B Natriuretic peptide (BNP) 992.0 09/16/2017: Hemoglobin 13.5; Platelets 155 11/30/2017: BUN 23; Creatinine, Ser 1.00; Potassium 4.1; Sodium 139  Recent Lipid Panel    Component Value Date/Time   CHOL 258 (H) 07/08/2017 1212   TRIG 109.0 07/08/2017 1212   HDL 68.70 07/08/2017 1212   CHOLHDL 4 07/08/2017 1212   VLDL 21.8 07/08/2017 1212   LDLCALC 167 (H) 07/08/2017 1212    Physical Exam:    VS:  BP 128/86 (BP Location: Right Arm, Patient Position: Sitting, Cuff Size: Normal)   Pulse 72   Ht 5' 6"  (1.676 m)   Wt 170 lb 4 oz (77.2 kg)   BMI 27.48 kg/m     Wt Readings from Last 3 Encounters:  12/18/17 170 lb 4 oz (77.2 kg)  11/30/17 170 lb 1.9 oz (77.2 kg)  11/11/17 169 lb 12.8 oz (77 kg)     GEN:  Well nourished, well developed in no acute distress HEENT: Normal NECK: No JVD; No carotid bruits LYMPHATICS: No lymphadenopathy CARDIAC: RR, no murmurs, rubs, gallops RESPIRATORY:  Clear to auscultation without rales, wheezing or rhonchi  ABDOMEN: Soft, non-tender, non-distended MUSCULOSKELETAL:  No edema; No deformity  SKIN: Warm and dry NEUROLOGIC:  Alert and oriented x 3 PSYCHIATRIC:  Normal affect   ASSESSMENT:    No diagnosis found. PLAN:    In order of problems listed above:  1. Chronic systolic congestive heart failure: Christian [name] seems to be responding well to current medications.  We will discontinue the losartan and start him on Entresto 24-26 mg twice a day.  Continue carvedilol.  Continue Lasix.  We will have him return to see Christian Dopp, PA in approximately 2 months.  We will check a basic metabolic profile in 3 weeks.  3.  Hyperlipidemia: The patient has been intolerant to statins.  They cause profound muscle injury which is lasted for many years.  He has been seen in lipid clinic and is being considered for PCSK 9  inhibitor.     Medication Adjustments/Labs and Tests Ordered: Current medicines are reviewed at length with the patient today.  Concerns regarding medicines are outlined above.  No orders of the defined types were placed in this encounter.  No orders of the defined types were placed in this encounter.   Signed, Christian Moores, MD  12/18/2017 11:59 AM    [place]

## 2017-12-18 NOTE — Patient Instructions (Addendum)
We will send for coverage of Praluent today. Once we have obtained coverage through your insurance we will send your information to the patient assistance foundation.   Cholesterol Cholesterol is a fat. Your body needs a small amount of cholesterol. Cholesterol (plaque) may build up in your blood vessels (arteries). That makes you more likely to have a heart attack or stroke. You cannot feel your cholesterol level. Having a blood test is the only way to find out if your level is high. Keep your test results. Work with your doctor to keep your cholesterol at a good level. What do the results mean?  Total cholesterol is how much cholesterol is in your blood.  LDL is bad cholesterol. This is the type that can build up. Try to have low LDL.  HDL is good cholesterol. It cleans your blood vessels and carries LDL away. Try to have high HDL.  Triglycerides are fat that the body can store or burn for energy. What are good levels of cholesterol?  Total cholesterol below 200.  LDL below 100 is good for people who have health risks. LDL below 70 is good for people who have very high risks.  HDL above 40 is good. It is best to have HDL of 60 or higher.  Triglycerides below 150. How can I lower my cholesterol? Diet Follow your diet program as told by your doctor.  Choose fish, white meat chicken, or [place] that is roasted or baked. Try not to eat red meat, fried foods, sausage, or lunch meats.  Eat lots of fresh fruits and vegetables.  Choose whole grains, beans, pasta, potatoes, and cereals.  Choose olive oil, corn oil, or canola oil. Only use small amounts.  Try not to eat butter, mayonnaise, shortening, or palm kernel oils.  Try not to eat foods with trans fats.  Choose low-fat or nonfat dairy foods. ? Drink skim or nonfat milk. ? Eat low-fat or nonfat yogurt and cheeses. ? Try not to drink whole milk or cream. ? Try not to eat ice cream, egg yolks, or full-fat cheeses.  Healthy  desserts include angel food cake, ginger snaps, animal crackers, hard candy, popsicles, and low-fat or nonfat frozen yogurt. Try not to eat pastries, cakes, pies, and cookies.  Exercise Follow your exercise program as told by your doctor.  Be more active. Try gardening, walking, and taking the stairs.  Ask your doctor about ways that you can be more active.  Medicine  Take over-the-counter and prescription medicines only as told by your doctor. This information is not intended to replace advice given to you by your health care provider. Make sure you discuss any questions you have with your health care provider. Document Released: 01/02/2009 Document Revised: 05/07/2016 Document Reviewed: 04/17/2016 Elsevier Interactive Patient Education  [employer].

## 2017-12-18 NOTE — Progress Notes (Signed)
Patient ID: Christian [name]                 DOB: 1940-12-07                    MRN: [id number]     HPI: Christian [name] is a 77 y.o. male patient of Dr. Acie Fredrickson that presents today for lipid evaluation.  PMH includes diabetes, hypertension, hyperlipidemia, neuropathy, idiopathic inflammatory myopathy. He was admitted in November 2018 for new onset systolic heart failure with an EF of 20%.  Cardiac catheterization demonstrated nonobstructive coronary artery disease consistent with nonischemic cardiomyopathy.   He reports that he is still dealing with muscle aching from the statin medications. He states that he would kill any doctor that attempts to put him back on a statin medication. He also reports that he has had so many issues with medications in the past (including claritinD that almost killed him). However, he is willing to listen to options for LDL lowering.  He reports that his cholesterol has been high his whole life and it has not caused him that many issues. Hence the reason that he is hesitant about starting a medication for cholesterol after his experiences with statins.   Risk Factors: CAD, DM, HTN LDL Goal: <70  Current Medications: none Intolerances: lipitor 2011 (damage to calf muscles), Vytorin 2004 (damage to calf muscles), pitavastatin 2mg  daily (he never actually tried)  Diet: Eats most meals from out. He eats fish 3 times a week baked or broiled with broccoli and steamed vegetables. He eats a lot of chicken. He occasionally will eat fried chicken. He drinks mostly juice mixed with water. He eats nuts. He previously was eating more vegetables.   Exercise: Used to exercise but limited due to SOB. He is active with his job and works 7 days a week.   Family History: Early death in his father; Hypertension in his father; Kidney disease in his father; Stroke in his father and mother. There is no history of Cancer, Alcohol abuse, Diabetes, Heart disease, or  Hyperlipidemia.  Labs: 07/08/17: TC 258, TG 109, HDL 68, LDL 167, nonHDL 189 (no therapy)  Past Medical History:  Diagnosis Date  . Chronic systolic heart failure ([place])    Echo 11/18: Mild LVH, EF 20, diffuse HK mild AI, a sending aorta 42 mm (mildly dilated), mild MR, mild TR, trivial PI, PASP 44 // R/L heart cath 11/18: Nonobstructive CAD, elevated PCWP (mean 29), moderate pulmonary hypertension, CO 3.66, CI 2.07  . Coronary artery disease involving native coronary artery of native heart without angina pectoris 11/11/2017   LHC 11/18: pLAD 45, oD1 65  . Hernia of abdominal cavity   . Hyperlipidemia   . Nonischemic cardiomyopathy ([place]) 11/11/2017   LHC 09/16/17-nonobstructive CAD    Current Outpatient Medications on File Prior to Visit  Medication Sig Dispense Refill  . aspirin EC 81 MG tablet Take 1 tablet (81 mg total) by mouth daily. 90 tablet 3  . carvedilol (COREG) 6.25 MG tablet Take 1 tablet (6.25 mg total) by mouth 2 (two) times daily. 60 tablet 6  . cholecalciferol (VITAMIN D) 1000 units tablet Take 1,000 Units by mouth daily.    Marland Kitchen doxycycline (VIBRA-TABS) 100 MG tablet Take 1 tablet (100 mg total) by mouth 2 (two) times daily. 28 tablet 0  . esomeprazole (NEXIUM) 40 MG capsule Take 40 mg by mouth as needed (for heartburn).    . furosemide (LASIX) 40 MG tablet Take 1 tablet (40 mg  total) by mouth daily. 30 tablet 6  . losartan (COZAAR) 25 MG tablet Take 1 tablet (25 mg total) by mouth daily. 90 tablet 3  . olopatadine (PATANOL) 0.1 % ophthalmic solution Place 1 drop into both eyes as needed for allergies.      No current facility-administered medications on file prior to visit.     Allergies  Allergen Reactions  . Amoxicillin Hives  . Bactrim [Sulfamethoxazole-Trimethoprim] Other (See Comments)    Dried out his eyes  . Cephalexin Itching  . Hydrocodone Itching  . Lexapro [Escitalopram Oxalate] Other (See Comments)    disorientation  . Lipitor [Atorvastatin] Other (See  Comments)    Damage to calf muscles  . Oxycodone Itching  . Statins Other (See Comments)    Muscle pain   . Vytorin [Ezetimibe-Simvastatin] Other (See Comments)    Damage to calf muscles    Assessment/Plan: Hyperlipidemia: LDL not at goal. After some discussion patient is willing to see about PCSK9i therapy. Will submit for coverage through insurance. Patient has also filled out PASS paperwork as he anticipates the cost will be prohibitive. He would like to come to office to do first injection since he has bad side effects on medications. Once medication received we will arrange this for him.    Thank you,  Lelan Pons. Patterson Hammersmith, [facility]  12/18/2017 7:42 AM

## 2017-12-20 ENCOUNTER — Encounter: Payer: Self-pay | Admitting: Physician Assistant

## 2018-01-06 ENCOUNTER — Encounter: Payer: Self-pay | Admitting: Internal Medicine

## 2018-01-06 ENCOUNTER — Other Ambulatory Visit (INDEPENDENT_AMBULATORY_CARE_PROVIDER_SITE_OTHER): Payer: Medicare Other

## 2018-01-06 ENCOUNTER — Ambulatory Visit (INDEPENDENT_AMBULATORY_CARE_PROVIDER_SITE_OTHER): Payer: Medicare Other | Admitting: Internal Medicine

## 2018-01-06 VITALS — BP 112/80 | HR 94 | Temp 97.8°F | Ht 66.0 in | Wt 170.0 lb

## 2018-01-06 DIAGNOSIS — I739 Peripheral vascular disease, unspecified: Secondary | ICD-10-CM | POA: Diagnosis not present

## 2018-01-06 DIAGNOSIS — E118 Type 2 diabetes mellitus with unspecified complications: Secondary | ICD-10-CM | POA: Diagnosis not present

## 2018-01-06 DIAGNOSIS — I10 Essential (primary) hypertension: Secondary | ICD-10-CM

## 2018-01-06 LAB — URINALYSIS, ROUTINE W REFLEX MICROSCOPIC
Bilirubin Urine: NEGATIVE
Hgb urine dipstick: NEGATIVE
Leukocytes, UA: NEGATIVE
Nitrite: NEGATIVE
SPECIFIC GRAVITY, URINE: 1.02 (ref 1.000–1.030)
TOTAL PROTEIN, URINE-UPE24: NEGATIVE
URINE GLUCOSE: NEGATIVE
UROBILINOGEN UA: 0.2 (ref 0.0–1.0)
pH: 6.5 (ref 5.0–8.0)

## 2018-01-06 NOTE — Progress Notes (Signed)
Subjective:  Patient ID: Christian [name], male    DOB: 1941-04-16  Age: 77 y.o. MRN: [id number]  CC: Hypertension   HPI Faust Thorington presents for f/up - He was recently diagnosed with congestive heart failure.  He is doing well on entresto and carvedilol.  He tells me his recent echocardiogram showed that his ejection fraction had increased from 20% to to nearly 30%.  He has had no recent episodes of chest pain or shortness of breath.  He continues to complain of lower extremity pain.  He had a NCS/EMG done which ruled out neuropathy and myopathy.  He tells me the lower extremity pain is related to activity and goes away when he rests.  His symptoms are consistent with claudication.  Outpatient Medications Prior to Visit  Medication Sig Dispense Refill  . aspirin EC 81 MG tablet Take 1 tablet (81 mg total) by mouth daily. 90 tablet 3  . carvedilol (COREG) 6.25 MG tablet Take 1 tablet (6.25 mg total) by mouth 2 (two) times daily. 60 tablet 6  . cholecalciferol (VITAMIN D) 1000 units tablet Take 1,000 Units by mouth daily.    Marland Kitchen esomeprazole (NEXIUM) 40 MG capsule Take 40 mg by mouth as needed (for heartburn).    . furosemide (LASIX) 40 MG tablet Take 1 tablet (40 mg total) by mouth daily. 30 tablet 6  . olopatadine (PATANOL) 0.1 % ophthalmic solution Place 1 drop into both eyes as needed for allergies.     . sacubitril-valsartan (ENTRESTO) 24-26 MG Take 1 tablet by mouth 2 (two) times daily. 60 tablet 3  . doxycycline (VIBRA-TABS) 100 MG tablet Take 1 tablet (100 mg total) by mouth 2 (two) times daily. 28 tablet 0   No facility-administered medications prior to visit.     ROS Review of Systems  Constitutional: Negative.  Negative for diaphoresis, fatigue and unexpected weight change.  HENT: Negative.   Eyes: Negative for visual disturbance.  Respiratory: Negative for cough, chest tightness, shortness of breath and wheezing.   Cardiovascular: Negative for chest pain, palpitations and leg  swelling.  Gastrointestinal: Negative for abdominal pain, constipation, diarrhea, nausea and vomiting.  Endocrine: Negative for polyuria.  Genitourinary: Negative.   Musculoskeletal: Negative.  Negative for arthralgias and myalgias.  Skin: Negative.  Negative for color change and pallor.  Allergic/Immunologic: Negative.   Neurological: Negative.   Hematological: Negative for adenopathy. Does not bruise/bleed easily.  Psychiatric/Behavioral: Negative.     Objective:  BP 112/80 (BP Location: Left Arm, Patient Position: Sitting, Cuff Size: Normal)   Pulse 94   Temp 97.8 F (36.6 C) (Oral)   Ht 5\' 6"  (1.676 m)   Wt 170 lb (77.1 kg)   SpO2 98%   BMI 27.44 kg/m   BP Readings from Last 3 Encounters:  01/06/18 112/80  12/18/17 128/86  11/30/17 128/82    Wt Readings from Last 3 Encounters:  01/06/18 170 lb (77.1 kg)  12/18/17 170 lb 4 oz (77.2 kg)  11/30/17 170 lb 1.9 oz (77.2 kg)    Physical Exam  Constitutional: He is oriented to person, place, and time. No distress.  HENT:  Mouth/Throat: Oropharynx is clear and moist. No oropharyngeal exudate.  Eyes: Conjunctivae are normal. No scleral icterus.  Neck: Normal range of motion. Neck supple. No JVD present. No thyromegaly present.  Cardiovascular: Normal rate, regular rhythm and normal heart sounds. Exam reveals no gallop.  No murmur heard. Pulses:      Carotid pulses are 1+ on the right side, and  1+ on the left side.      Radial pulses are 1+ on the right side, and 1+ on the left side.       Femoral pulses are 1+ on the right side, and 1+ on the left side.      Popliteal pulses are 0 on the right side, and 0 on the left side.       Dorsalis pedis pulses are 0 on the right side, and 0 on the left side.       Posterior tibial pulses are 0 on the right side, and 0 on the left side.  I cannot feel the pulses in his feet  Pulmonary/Chest: Effort normal and breath sounds normal. No respiratory distress. He has no wheezes. He has  no rales.  Abdominal: Soft. Bowel sounds are normal. He exhibits no distension and no mass. There is no tenderness. There is no guarding.  Musculoskeletal: Normal range of motion. He exhibits no edema, tenderness or deformity.  Lymphadenopathy:    He has no cervical adenopathy.  Neurological: He is alert and oriented to person, place, and time.  Skin: Skin is warm and dry. No rash noted. He is not diaphoretic. No erythema. No pallor.  Vitals reviewed.   Lab Results  Component Value Date   WBC 9.7 09/16/2017   HGB 13.5 09/16/2017   HCT 43.0 09/16/2017   PLT 155 09/16/2017   GLUCOSE 148 (H) 01/06/2018   CHOL 258 (H) 07/08/2017   TRIG 109.0 07/08/2017   HDL 68.70 07/08/2017   LDLCALC 167 (H) 07/08/2017   ALT 16 07/08/2017   AST 15 07/08/2017   NA 138 01/06/2018   K 4.7 01/06/2018   CL 103 01/06/2018   CREATININE 1.08 01/06/2018   BUN 20 01/06/2018   CO2 25 01/06/2018   TSH 1.22 07/08/2017   INR 1.07 09/16/2017   HGBA1C 6.3 11/30/2017   MICROALBUR 3.2 (H) 01/06/2018    Dg Chest 2 View  Result Date: 09/14/2017 CLINICAL DATA:  Short of breath nausea for 6 weeks EXAM: CHEST  2 VIEW COMPARISON:  None. FINDINGS: Borderline cardiomegaly. Normal vascularity. Low lung volumes. Bibasilar atelectasis. No pneumothorax or pleural effusion. IMPRESSION: Bibasilar airspace disease. Electronically Signed   By: Marybelle Killings M.D.   On: 09/14/2017 17:54   Ct Angio Chest Pe W And/or Wo Contrast  Result Date: 09/15/2017 CLINICAL DATA:  Shortness of breath EXAM: CT ANGIOGRAPHY CHEST WITH CONTRAST TECHNIQUE: Multidetector CT imaging of the chest was performed using the standard protocol during bolus administration of intravenous contrast. Multiplanar CT image reconstructions and MIPs were obtained to evaluate the vascular anatomy. CONTRAST:  188mL ISOVUE-370 IOPAMIDOL (ISOVUE-370) INJECTION 76% COMPARISON:  Chest radiograph 09/14/2017 FINDINGS: Cardiovascular: Contrast injection is sufficient to  demonstrate satisfactory opacification of the pulmonary arteries to the segmental level. There is no pulmonary embolus. The main pulmonary artery is within normal limits for size. There is no CT evidence of acute right heart strain. Mild calcific aortic atherosclerosis. There is a normal 3-vessel arch branching pattern. Heart size is normal, without pericardial effusion. Mediastinum/Nodes: No mediastinal, hilar or axillary lymphadenopathy. The visualized thyroid and thoracic esophageal course are unremarkable. Lungs/Pleura: No pulmonary nodules or masses. No pleural effusion or pneumothorax. No focal airspace consolidation. No focal pleural abnormality. Upper Abdomen: Contrast bolus timing is not optimized for evaluation of the abdominal organs. Within this limitation, the visualized organs of the upper abdomen are normal. Musculoskeletal: No chest wall abnormality. No acute or significant osseous findings. Review of the  MIP images confirms the above findings. IMPRESSION: 1. No pulmonary embolus or other acute abnormality of the chest. 2.  Aortic Atherosclerosis (ICD10-I70.0). Electronically Signed   By: Ulyses Jarred M.D.   On: 09/15/2017 02:13    Assessment & Plan:   Kyrel was seen today for hypertension.  Diagnoses and all orders for this visit:  Claudication of both lower extremities ([place])- I have asked him to undergo ABIs to see if he has PAD. -     VAS [place] ABI WITH/WO TBI; Future  Type 2 diabetes mellitus with complication, without long-term current use of insulin ([place])- His recent A1c was 6.3%.  His blood sugars are adequately well controlled. -     Basic metabolic panel; Future -     Microalbumin / creatinine urine ratio; Future -     Ambulatory referral to Ophthalmology  Essential hypertension- His blood pressure is well controlled.  Electrolytes and renal function are normal. -     Basic metabolic panel; Future -     Urinalysis, Routine w reflex microscopic; Future   I have  discontinued Gyan Boliver's doxycycline. I am also having him maintain his olopatadine, aspirin EC, cholecalciferol, carvedilol, furosemide, esomeprazole, and sacubitril-valsartan.  No orders of the defined types were placed in this encounter.    Follow-up: Return in about 3 months (around 04/08/2018).  Scarlette Calico, MD

## 2018-01-06 NOTE — Patient Instructions (Signed)
Peripheral Vascular Disease Peripheral vascular disease (PVD) is a disease of the blood vessels that are not part of your heart and brain. A simple term for PVD is poor circulation. In most cases, PVD narrows the blood vessels that carry blood from your heart to the rest of your body. This can result in a decreased supply of blood to your arms, legs, and internal organs, like your stomach or kidneys. However, it most often affects a person's lower legs and feet. There are two types of PVD.  Organic PVD. This is the more common type. It is caused by damage to the structure of blood vessels.  Functional PVD. This is caused by conditions that make blood vessels contract and tighten (spasm).  Without treatment, PVD tends to get worse over time. PVD can also lead to acute ischemic limb. This is when an arm or limb suddenly has trouble getting enough blood. This is a medical emergency. What are the causes? Each type of PVD has many different causes. The most common cause of PVD is buildup of a fatty material (plaque) inside of your arteries (atherosclerosis). Small amounts of plaque can break off from the walls of the blood vessels and become lodged in a smaller artery. This blocks blood flow and can cause acute ischemic limb. Other common causes of PVD include:  Blood clots that form inside of blood vessels.  Injuries to blood vessels.  Diseases that cause inflammation of blood vessels or cause blood vessel spasms.  Health behaviors and health history that increase your risk of developing PVD.  What increases the risk? You may have a greater risk of PVD if you:  Have a family history of PVD.  Have certain medical conditions, including: ? High cholesterol. ? Diabetes. ? High blood pressure (hypertension). ? Coronary heart disease. ? Past problems with blood clots. ? Past injury, such as burns or a broken bone. These may have damaged blood vessels in your limbs. ? Buerger disease. This is  caused by inflamed blood vessels in your hands and feet. ? Some forms of arthritis. ? Rare birth defects that affect the arteries in your legs.  Use tobacco.  Do not get enough exercise.  Are obese.  Are age 50 or older.  What are the signs or symptoms? PVD may cause many different symptoms. Your symptoms depend on what part of your body is not getting enough blood. Some common signs and symptoms include:  Cramps in your lower legs. This may be a symptom of poor leg circulation (claudication).  Pain and weakness in your legs while you are physically active that goes away when you rest (intermittent claudication).  Leg pain when at rest.  Leg numbness, tingling, or weakness.  Coldness in a leg or foot, especially when compared with the other leg.  Skin or hair changes. These can include: ? Hair loss. ? Shiny skin. ? Pale or bluish skin. ? Thick toenails.  Inability to get or maintain an erection (erectile dysfunction).  People with PVD are more prone to developing ulcers and sores on their toes, feet, or legs. These may take longer than normal to heal. How is this diagnosed? Your health care provider may diagnose PVD from your signs and symptoms. The health care provider will also do a physical exam. You may have tests to find out what is causing your PVD and determine its severity. Tests may include:  Blood pressure recordings from your arms and legs and measurements of the strength of your pulses (  pulse volume recordings).  Imaging studies using sound waves to take pictures of the blood flow through your blood vessels (Doppler ultrasound).  Injecting a dye into your blood vessels before having imaging studies using: ? X-rays (angiogram or arteriogram). ? Computer-generated X-rays (CT angiogram). ? A powerful electromagnetic field and a computer (magnetic resonance angiogram or MRA).  How is this treated? Treatment for PVD depends on the cause of your condition and the  severity of your symptoms. It also depends on your age. Underlying causes need to be treated and controlled. These include long-lasting (chronic) conditions, such as diabetes, high cholesterol, and high blood pressure. You may need to first try making lifestyle changes and taking medicines. Surgery may be needed if these do not work. Lifestyle changes may include:  Quitting smoking.  Exercising regularly.  Following a low-fat, low-cholesterol diet.  Medicines may include:  Blood thinners to prevent blood clots.  Medicines to improve blood flow.  Medicines to improve your blood cholesterol levels.  Surgical procedures may include:  A procedure that uses an inflated balloon to open a blocked artery and improve blood flow (angioplasty).  A procedure to put in a tube (stent) to keep a blocked artery open (stent implant).  Surgery to reroute blood flow around a blocked artery (peripheral bypass surgery).  Surgery to remove dead tissue from an infected wound on the affected limb.  Amputation. This is surgical removal of the affected limb. This may be necessary in cases of acute ischemic limb that are not improved through medical or surgical treatments.  Follow these instructions at home:  Take medicines only as directed by your health care provider.  Do not use any tobacco products, including cigarettes, chewing tobacco, or electronic cigarettes. If you need help quitting, ask your health care provider.  Lose weight if you are overweight, and maintain a healthy weight as directed by your health care provider.  Eat a diet that is low in fat and cholesterol. If you need help, ask your health care provider.  Exercise regularly. Ask your health care provider to suggest some good activities for you.  Use compression stockings or other mechanical devices as directed by your health care provider.  Take good care of your feet. ? Wear comfortable shoes that fit well. ? Check your feet  often for any cuts or sores. Contact a health care provider if:  You have cramps in your legs while walking.  You have leg pain when you are at rest.  You have coldness in a leg or foot.  Your skin changes.  You have erectile dysfunction.  You have cuts or sores on your feet that are not healing. Get help right away if:  Your arm or leg turns cold and blue.  Your arms or legs become red, warm, swollen, painful, or numb.  You have chest pain or trouble breathing.  You suddenly have weakness in your face, arm, or leg.  You become very confused or lose the ability to speak.  You suddenly have a very bad headache or lose your vision. This information is not intended to replace advice given to you by your health care provider. Make sure you discuss any questions you have with your health care provider. Document Released: 11/13/2004 Document Revised: 03/13/2016 Document Reviewed: 03/16/2014 Elsevier Interactive Patient Education  2017 Elsevier Inc.  

## 2018-01-07 LAB — BASIC METABOLIC PANEL
BUN: 20 mg/dL (ref 6–23)
CHLORIDE: 103 meq/L (ref 96–112)
CO2: 25 mEq/L (ref 19–32)
CREATININE: 1.08 mg/dL (ref 0.40–1.50)
Calcium: 9.8 mg/dL (ref 8.4–10.5)
GFR: 70.36 mL/min (ref >60.00)
Glucose, Bld: 148 mg/dL — ABNORMAL HIGH (ref 70–99)
Potassium: 4.7 mEq/L (ref 3.5–5.1)
SODIUM: 138 meq/L (ref 135–145)

## 2018-01-07 LAB — MICROALBUMIN / CREATININE URINE RATIO
Creatinine,U: 173.5 mg/dL
Microalb Creat Ratio: 1.8 mg/g (ref 0.0–30.0)
Microalb, Ur: 3.2 mg/dL — ABNORMAL HIGH (ref 0.0–1.9)

## 2018-01-08 ENCOUNTER — Telehealth: Payer: Self-pay | Admitting: Internal Medicine

## 2018-01-08 NOTE — Telephone Encounter (Signed)
Spoke to pt and gave him [facility] # to schedule his ABI. He is still concerned as to whether or not to have this done since he had a similar test done before that showed no problems  Copied from [place] [phone]. Topic: General - Other >> Jan 08, 2018  3:23 PM Marin Olp L wrote: Reason for CRM: Patient wants to know when the blood flow test will be done for his legs and/or if Dr, Ronnald Ramp thinks it's still necessary? He had a similar test done at [facility]. He's still having legs issues.  >> Jan 08, 2018  3:38 PM Para Skeans A wrote: Patient was seen on 3/20.

## 2018-01-11 ENCOUNTER — Encounter: Payer: Self-pay | Admitting: Internal Medicine

## 2018-01-11 ENCOUNTER — Ambulatory Visit (INDEPENDENT_AMBULATORY_CARE_PROVIDER_SITE_OTHER): Payer: Medicare Other | Admitting: Internal Medicine

## 2018-01-11 VITALS — BP 110/80 | HR 84 | Temp 98.2°F | Resp 16 | Ht 66.0 in | Wt 171.1 lb

## 2018-01-11 DIAGNOSIS — R1312 Dysphagia, oropharyngeal phase: Secondary | ICD-10-CM | POA: Diagnosis not present

## 2018-01-11 DIAGNOSIS — G7249 Other inflammatory and immune myopathies, not elsewhere classified: Secondary | ICD-10-CM | POA: Diagnosis not present

## 2018-01-11 DIAGNOSIS — I739 Peripheral vascular disease, unspecified: Secondary | ICD-10-CM | POA: Diagnosis not present

## 2018-01-11 DIAGNOSIS — K2100 Gastro-esophageal reflux disease with esophagitis, without bleeding: Secondary | ICD-10-CM

## 2018-01-11 DIAGNOSIS — G5793 Unspecified mononeuropathy of bilateral lower limbs: Secondary | ICD-10-CM | POA: Diagnosis not present

## 2018-01-11 HISTORY — DX: Gastro-esophageal reflux disease with esophagitis, without bleeding: K21.00

## 2018-01-11 HISTORY — DX: Dysphagia, oropharyngeal phase: R13.12

## 2018-01-11 MED ORDER — TRAMADOL HCL 50 MG PO TABS: 50.0000 mg | ORAL_TABLET | Freq: Two times a day (BID) | ORAL | 5 refills | Status: DC | PRN

## 2018-01-11 NOTE — Patient Instructions (Signed)
Gastroesophageal Reflux Disease, Adult Normally, food travels down the esophagus and stays in the stomach to be digested. However, when a person has gastroesophageal reflux disease (GERD), food and stomach acid move back up into the esophagus. When this happens, the esophagus becomes sore and inflamed. Over time, GERD can create small holes (ulcers) in the lining of the esophagus. What are the causes? This condition is caused by a problem with the muscle between the esophagus and the stomach (lower esophageal sphincter, or LES). Normally, the LES muscle closes after food passes through the esophagus to the stomach. When the LES is weakened or abnormal, it does not close properly, and that allows food and stomach acid to go back up into the esophagus. The LES can be weakened by certain dietary substances, medicines, and medical conditions, including:  Tobacco use.  Pregnancy.  Having a hiatal hernia.  Heavy alcohol use.  Certain foods and beverages, such as coffee, chocolate, onions, and peppermint.  What increases the risk? This condition is more likely to develop in:  People who have an increased body weight.  People who have connective tissue disorders.  People who use NSAID medicines.  What are the signs or symptoms? Symptoms of this condition include:  Heartburn.  Difficult or painful swallowing.  The feeling of having a lump in the throat.  Abitter taste in the mouth.  Bad breath.  Having a large amount of saliva.  Having an upset or bloated stomach.  Belching.  Chest pain.  Shortness of breath or wheezing.  Ongoing (chronic) cough or a night-time cough.  Wearing away of tooth enamel.  Weight loss.  Different conditions can cause chest pain. Make sure to see your health care provider if you experience chest pain. How is this diagnosed? Your health care provider will take a medical history and perform a physical exam. To determine if you have mild or severe  GERD, your health care provider may also monitor how you respond to treatment. You may also have other tests, including:  An endoscopy toexamine your stomach and esophagus with a small camera.  A test thatmeasures the acidity level in your esophagus.  A test thatmeasures how much pressure is on your esophagus.  A barium swallow or modified barium swallow to show the shape, size, and functioning of your esophagus.  How is this treated? The goal of treatment is to help relieve your symptoms and to prevent complications. Treatment for this condition may vary depending on how severe your symptoms are. Your health care provider may recommend:  Changes to your diet.  Medicine.  Surgery.  Follow these instructions at home: Diet  Follow a diet as recommended by your health care provider. This may involve avoiding foods and drinks such as: ? Coffee and tea (with or without caffeine). ? Drinks that containalcohol. ? Energy drinks and sports drinks. ? Carbonated drinks or sodas. ? Chocolate and cocoa. ? Peppermint and mint flavorings. ? Garlic and onions. ? Horseradish. ? Spicy and acidic foods, including peppers, chili powder, curry powder, vinegar, hot sauces, and barbecue sauce. ? Citrus fruit juices and citrus fruits, such as oranges, lemons, and limes. ? Tomato-based foods, such as red sauce, chili, salsa, and pizza with red sauce. ? Fried and fatty foods, such as donuts, french fries, potato chips, and high-fat dressings. ? High-fat meats, such as hot dogs and fatty cuts of red and white meats, such as rib eye steak, sausage, ham, and bacon. ? High-fat dairy items, such as whole milk,   butter, and cream cheese.  Eat small, frequent meals instead of large meals.  Avoid drinking large amounts of liquid with your meals.  Avoid eating meals during the 2-3 hours before bedtime.  Avoid lying down right after you eat.  Do not exercise right after you eat. General  instructions  Pay attention to any changes in your symptoms.  Take over-the-counter and prescription medicines only as told by your health care provider. Do not take aspirin, ibuprofen, or other NSAIDs unless your health care provider told you to do so.  Do not use any tobacco products, including cigarettes, chewing tobacco, and e-cigarettes. If you need help quitting, ask your health care provider.  Wear loose-fitting clothing. Do not wear anything tight around your waist that causes pressure on your abdomen.  Raise (elevate) the head of your bed 6 inches (15cm).  Try to reduce your stress, such as with yoga or meditation. If you need help reducing stress, ask your health care provider.  If you are overweight, reduce your weight to an amount that is healthy for you. Ask your health care provider for guidance about a safe weight loss goal.  Keep all follow-up visits as told by your health care provider. This is important. Contact a health care provider if:  You have new symptoms.  You have unexplained weight loss.  You have difficulty swallowing, or it hurts to swallow.  You have wheezing or a persistent cough.  Your symptoms do not improve with treatment.  You have a hoarse voice. Get help right away if:  You have pain in your arms, neck, jaw, teeth, or back.  You feel sweaty, dizzy, or light-headed.  You have chest pain or shortness of breath.  You vomit and your vomit looks like blood or coffee grounds.  You faint.  Your stool is bloody or black.  You cannot swallow, drink, or eat. This information is not intended to replace advice given to you by your health care provider. Make sure you discuss any questions you have with your health care provider. Document Released: 07/16/2005 Document Revised: 03/05/2016 Document Reviewed: 01/31/2015 Elsevier Interactive Patient Education  2018 Elsevier Inc.  

## 2018-01-11 NOTE — Telephone Encounter (Signed)
Pt is coming in for an appt today and will discuss with Dr. Ronnald Ramp.

## 2018-01-11 NOTE — Progress Notes (Signed)
Subjective:  Patient ID: Christian [name], male    DOB: Feb 17, 1941  Age: 77 y.o. MRN: [id number]  CC: Gastroesophageal Reflux   HPI Christian [name] presents for f/up - He complains of a one-week history of feeling like food gets stuck in his upper esophagus.  He has to drink water to make it pass.  He has had no choking episodes and he denies odynophagia or chest pain.  He also complains of persistent lower extremity pain.  He is scheduled for ABIs within the next week or 2.  Outpatient Medications Prior to Visit  Medication Sig Dispense Refill  . aspirin EC 81 MG tablet Take 1 tablet (81 mg total) by mouth daily. 90 tablet 3  . carvedilol (COREG) 6.25 MG tablet Take 1 tablet (6.25 mg total) by mouth 2 (two) times daily. 60 tablet 6  . cholecalciferol (VITAMIN D) 1000 units tablet Take 1,000 Units by mouth daily.    Marland Kitchen esomeprazole (NEXIUM) 40 MG capsule Take 40 mg by mouth as needed (for heartburn).    . furosemide (LASIX) 40 MG tablet Take 1 tablet (40 mg total) by mouth daily. 30 tablet 6  . olopatadine (PATANOL) 0.1 % ophthalmic solution Place 1 drop into both eyes as needed for allergies.     . sacubitril-valsartan (ENTRESTO) 24-26 MG Take 1 tablet by mouth 2 (two) times daily. 60 tablet 3   No facility-administered medications prior to visit.     ROS Review of Systems  Constitutional: Negative for appetite change, diaphoresis, fatigue and unexpected weight change.  HENT: Positive for trouble swallowing. Negative for sore throat and voice change.   Eyes: Negative.  Negative for visual disturbance.  Respiratory: Negative for cough, chest tightness, shortness of breath and wheezing.   Cardiovascular: Negative for chest pain, palpitations and leg swelling.  Gastrointestinal: Negative for abdominal pain, constipation, diarrhea, nausea and vomiting.  Endocrine: Negative.   Genitourinary: Negative.  Negative for difficulty urinating.  Musculoskeletal: Positive for myalgias. Negative for  back pain.       Diffuse LE pain that eases with palpation  Skin: Negative.   Allergic/Immunologic: Negative.   Neurological: Negative.  Negative for dizziness, weakness and numbness.  Hematological: Negative for adenopathy. Does not bruise/bleed easily.  Psychiatric/Behavioral: Negative.     Objective:  BP 110/80 (BP Location: Left Arm, Patient Position: Sitting, Cuff Size: Normal)   Pulse 84   Temp 98.2 F (36.8 C) (Oral)   Resp 16   Ht 5\' 6"  (1.676 m)   Wt 171 lb 2 oz (77.6 kg)   SpO2 99%   BMI 27.62 kg/m   BP Readings from Last 3 Encounters:  01/11/18 110/80  01/06/18 112/80  12/18/17 128/86    Wt Readings from Last 3 Encounters:  01/11/18 171 lb 2 oz (77.6 kg)  01/06/18 170 lb (77.1 kg)  12/18/17 170 lb 4 oz (77.2 kg)    Physical Exam  Constitutional: He is oriented to person, place, and time.  Non-toxic appearance. He does not have a sickly appearance. He does not appear ill. No distress.  HENT:  Mouth/Throat: Oropharynx is clear and moist. No oropharyngeal exudate.  Eyes: Conjunctivae are normal. Left eye exhibits no discharge. No scleral icterus.  Neck: Normal range of motion. Neck supple. No JVD present. No thyromegaly present.  Cardiovascular: Normal rate, regular rhythm and normal heart sounds. Exam reveals no gallop.  No murmur heard. Pulmonary/Chest: Effort normal and breath sounds normal. No respiratory distress. He has no wheezes. He has no rales.  Abdominal: Soft. Bowel sounds are normal. He exhibits no distension and no mass. There is no tenderness. There is no guarding.  Musculoskeletal: Normal range of motion. He exhibits no edema or tenderness.  Lymphadenopathy:    He has no cervical adenopathy.  Neurological: He is alert and oriented to person, place, and time.  Skin: Skin is warm and dry. No rash noted. He is not diaphoretic. No erythema. No pallor.  Vitals reviewed.   Lab Results  Component Value Date   WBC 9.7 09/16/2017   HGB 13.5  09/16/2017   HCT 43.0 09/16/2017   PLT 155 09/16/2017   GLUCOSE 148 (H) 01/06/2018   CHOL 258 (H) 07/08/2017   TRIG 109.0 07/08/2017   HDL 68.70 07/08/2017   LDLCALC 167 (H) 07/08/2017   ALT 16 07/08/2017   AST 15 07/08/2017   NA 138 01/06/2018   K 4.7 01/06/2018   CL 103 01/06/2018   CREATININE 1.08 01/06/2018   BUN 20 01/06/2018   CO2 25 01/06/2018   TSH 1.22 07/08/2017   INR 1.07 09/16/2017   HGBA1C 6.3 11/30/2017   MICROALBUR 3.2 (H) 01/06/2018    Dg Chest 2 View  Result Date: 09/14/2017 CLINICAL DATA:  Short of breath nausea for 6 weeks EXAM: CHEST  2 VIEW COMPARISON:  None. FINDINGS: Borderline cardiomegaly. Normal vascularity. Low lung volumes. Bibasilar atelectasis. No pneumothorax or pleural effusion. IMPRESSION: Bibasilar airspace disease. Electronically Signed   By: Marybelle Killings M.D.   On: 09/14/2017 17:54   Ct Angio Chest Pe W And/or Wo Contrast  Result Date: 09/15/2017 CLINICAL DATA:  Shortness of breath EXAM: CT ANGIOGRAPHY CHEST WITH CONTRAST TECHNIQUE: Multidetector CT imaging of the chest was performed using the standard protocol during bolus administration of intravenous contrast. Multiplanar CT image reconstructions and MIPs were obtained to evaluate the vascular anatomy. CONTRAST:  177mL ISOVUE-370 IOPAMIDOL (ISOVUE-370) INJECTION 76% COMPARISON:  Chest radiograph 09/14/2017 FINDINGS: Cardiovascular: Contrast injection is sufficient to demonstrate satisfactory opacification of the pulmonary arteries to the segmental level. There is no pulmonary embolus. The main pulmonary artery is within normal limits for size. There is no CT evidence of acute right heart strain. Mild calcific aortic atherosclerosis. There is a normal 3-vessel arch branching pattern. Heart size is normal, without pericardial effusion. Mediastinum/Nodes: No mediastinal, hilar or axillary lymphadenopathy. The visualized thyroid and thoracic esophageal course are unremarkable. Lungs/Pleura: No  pulmonary nodules or masses. No pleural effusion or pneumothorax. No focal airspace consolidation. No focal pleural abnormality. Upper Abdomen: Contrast bolus timing is not optimized for evaluation of the abdominal organs. Within this limitation, the visualized organs of the upper abdomen are normal. Musculoskeletal: No chest wall abnormality. No acute or significant osseous findings. Review of the MIP images confirms the above findings. IMPRESSION: 1. No pulmonary embolus or other acute abnormality of the chest. 2.  Aortic Atherosclerosis (ICD10-I70.0). Electronically Signed   By: Ulyses Jarred M.D.   On: 09/15/2017 02:13    Assessment & Plan:   Quantez was seen today for gastroesophageal reflux.  Diagnoses and all orders for this visit:  Oropharyngeal dysphagia- I have asked him to undergo a barium swallow to see if there is an obstructing lesion, stricture, ring, or mass. -     DG Esophagus; Future  Neuropathy involving both lower extremities- Will try to control the pain with tramadol. -     traMADol (ULTRAM) 50 MG tablet; Take 1 tablet (50 mg total) by mouth every 12 (twelve) hours as needed.  Claudication of both lower  extremities ([place])- He was encouraged to have the arterial flow studies completed.  Idiopathic inflammatory myopathy- As above.  GERD with esophagitis- I have asked him to be compliant with the PPI.   I am having [name] start on traMADol. I am also having him maintain his olopatadine, aspirin EC, cholecalciferol, carvedilol, furosemide, esomeprazole, and sacubitril-valsartan.  Meds ordered this encounter  Medications  . traMADol (ULTRAM) 50 MG tablet    Sig: Take 1 tablet (50 mg total) by mouth every 12 (twelve) hours as needed.    Dispense:  60 tablet    Refill:  5     Follow-up: Return in about 22 months (around 11/14/2019).  Scarlette Calico, MD

## 2018-01-15 ENCOUNTER — Encounter: Payer: Self-pay | Admitting: Internal Medicine

## 2018-01-27 ENCOUNTER — Ambulatory Visit (HOSPITAL_COMMUNITY)
Admission: RE | Admit: 2018-01-27 | Discharge: 2018-01-27 | Disposition: A | Discharge status: Home / Self Care | Payer: Medicare Other | Source: Ambulatory Visit | Attending: Vascular Surgery | Admitting: Vascular Surgery

## 2018-01-27 DIAGNOSIS — I739 Peripheral vascular disease, unspecified: Secondary | ICD-10-CM | POA: Diagnosis not present

## 2018-02-09 ENCOUNTER — Telehealth: Payer: Self-pay | Admitting: Pharmacist

## 2018-02-09 NOTE — Telephone Encounter (Signed)
Pt denied from [employer] patient assistance for Praluent - he needs to apply for [employer] first. Pt provided with their number [phone] and will apply for LIS.

## 2018-02-17 ENCOUNTER — Encounter: Payer: Self-pay | Admitting: Physician Assistant

## 2018-02-17 ENCOUNTER — Ambulatory Visit: Payer: Medicare Other | Admitting: Physician Assistant

## 2018-02-17 VITALS — BP 120/70 | HR 82 | Ht 66.0 in | Wt 171.0 lb

## 2018-02-17 DIAGNOSIS — E118 Type 2 diabetes mellitus with unspecified complications: Secondary | ICD-10-CM | POA: Diagnosis not present

## 2018-02-17 DIAGNOSIS — E785 Hyperlipidemia, unspecified: Secondary | ICD-10-CM | POA: Diagnosis not present

## 2018-02-17 DIAGNOSIS — I5022 Chronic systolic (congestive) heart failure: Secondary | ICD-10-CM

## 2018-02-17 DIAGNOSIS — I428 Other cardiomyopathies: Secondary | ICD-10-CM

## 2018-02-17 DIAGNOSIS — I10 Essential (primary) hypertension: Secondary | ICD-10-CM | POA: Diagnosis not present

## 2018-02-17 MED ORDER — CARVEDILOL 12.5 MG PO TABS: 12.5000 mg | ORAL_TABLET | Freq: Two times a day (BID) | ORAL | 3 refills | Status: DC

## 2018-02-17 NOTE — Progress Notes (Addendum)
Cardiology Office Note:    Date:  02/17/2018   ID:  Christian [name], DOB 05-02-1941, MRN [id number]  PCP:  Janith Lima, MD  Cardiologist:  Mertie Moores, MD   Referring MD: Janith Lima, MD   Chief Complaint  Patient presents with  . Follow-up    chronic systolic heart failure    History of Present Illness:    Christian [name] is a 77 y.o. male with a hx of HTN, HLD, DM, chronic systolic heart failure, and intolerance to satins. He was found to have a reduced LVEF of 20% 09/15/17. Right and left heart cath 09/16/17 with nonobstuctive CAD and moderate pulmonary HTN. He was stated on low dose coreg and low dose losartan while hospitalized. He was also transitioned from IV lasix to PO dosing and maintained on 81 mg ASA. He has been intoleant to statins in the past and was referred to lipid clinic for consideration of PCSK9 inhibitor.   Coreg and losartan were uptitrated in the outpatient setting. Repeat echo 12/18/17 with sustained reduced EF of 25-30%. He was transitioned to [facility] 12/18/17. Lipid clinic working on PCSK9 inhibitor approval.   Her returns today for follow up. He states his quality of life is poor right now because he was told he couldn't take his yearly steroid taper for allergies. He is also suffering from leg pain and intermittent numbness as a side effect of lipitor. He last took a statin 2013. He had EMG on his legs that was negative. He has been compliant on his home medications.   He denies chest pain, palpitations, shortness of breath, DOE, orthopnea,and lower extremity edema. He was approved for praluent. Will work with pharmacy to continue patient assistance process. Overall, he is doing quite well, but wants to know when he will die. He would like to continue titrating medications for now and does not want to pursue EP evaluation for consideration of an ICD until he is on a stable medicine regimen.    Past Medical History:  Diagnosis Date  . Chronic systolic heart  failure ([place])    Echo 11/18: Mild LVH, EF 20, diffuse HK mild AI, a sending aorta 42 mm (mildly dilated), mild MR, mild TR, trivial PI, PASP 44 // R/L heart cath 11/18: Nonobstructive CAD, elevated PCWP (mean 29), moderate pulmonary hypertension, CO 3.66, CI 2.07 // Echo 3/19: mild LVH, EF 25-30, diff HK, Gr 1 DD, asc aorta 43 mm (mildly dilated), MAC, mild reduced RVSF   . Coronary artery disease involving native coronary artery of native heart without angina pectoris 11/11/2017   LHC 11/18: pLAD 45, oD1 65  . Hernia of abdominal cavity   . Hyperlipidemia   . Nonischemic cardiomyopathy ([place]) 11/11/2017   LHC 09/16/17-nonobstructive CAD    Past Surgical History:  Procedure Laterality Date  . BACK SURGERY    . HERNIA REPAIR    . RIGHT/LEFT HEART CATH AND CORONARY ANGIOGRAPHY N/A 09/16/2017   Procedure: RIGHT/LEFT HEART CATH AND CORONARY ANGIOGRAPHY;  Surgeon: Martinique, Peter M, MD;  Location: [place] [facility];  Service: Cardiovascular;  Laterality: N/A;  . SPINE SURGERY      Current Medications: Current Meds  Medication Sig  . aspirin EC 81 MG tablet Take 1 tablet (81 mg total) by mouth daily.  . cholecalciferol (VITAMIN D) 1000 units tablet Take 1,000 Units by mouth daily.  Marland Kitchen esomeprazole (NEXIUM) 40 MG capsule Take 40 mg by mouth as needed (for heartburn).  . furosemide (LASIX) 40 MG tablet Take  1 tablet (40 mg total) by mouth daily.  Marland Kitchen olopatadine (PATANOL) 0.1 % ophthalmic solution Place 1 drop into both eyes as needed for allergies.   . sacubitril-valsartan (ENTRESTO) 24-26 MG Take 1 tablet by mouth 2 (two) times daily.  . traMADol (ULTRAM) 50 MG tablet Take 1 tablet (50 mg total) by mouth every 12 (twelve) hours as needed.  . [DISCONTINUED] carvedilol (COREG) 6.25 MG tablet Take 1 tablet (6.25 mg total) by mouth 2 (two) times daily.     Allergies:   Amoxicillin; Bactrim [sulfamethoxazole-trimethoprim]; Cephalexin; Hydrocodone; Lexapro [escitalopram oxalate]; Lipitor [atorvastatin];  Oxycodone; Statins; and Vytorin [ezetimibe-simvastatin]   Social History   Socioeconomic History  . Marital status: Single    Spouse name: Not on file  . Number of children: Not on file  . Years of education: Not on file  . Highest education level: Not on file  Occupational History  . Not on file  Social Needs  . Financial resource strain: Not on file  . Food insecurity:    Worry: Not on file    Inability: Not on file  . Transportation needs:    Medical: Not on file    Non-medical: Not on file  Tobacco Use  . Smoking status: Never Smoker  . Smokeless tobacco: Never Used  Substance and Sexual Activity  . Alcohol use: No  . Drug use: No  . Sexual activity: Not Currently  Lifestyle  . Physical activity:    Days per week: Not on file    Minutes per session: Not on file  . Stress: Not on file  Relationships  . Social connections:    Talks on phone: Not on file    Gets together: Not on file    Attends religious service: Not on file    Active member of club or organization: Not on file    Attends meetings of clubs or organizations: Not on file    Relationship status: Not on file  Other Topics Concern  . Not on file  Social History Narrative  . Not on file     Family History: The patient's family history includes Early death in his father; Hypertension in his father; Kidney disease in his father; Stroke in his father and mother. There is no history of Cancer, Alcohol abuse, Diabetes, Heart disease, or Hyperlipidemia.  ROS:   Please see the history of present illness.    All other systems reviewed and are negative.  EKGs/Labs/Other Studies Reviewed:    The following studies were reviewed today:  Echo 12/18/17: Study Conclusions - Left ventricle: The cavity size was normal. Wall thickness was   increased in a pattern of mild LVH. Systolic function was   severely reduced. The estimated ejection fraction was in the   range of 25% to 30%. Diffuse hypokinesis. Doppler  parameters are   consistent with abnormal left ventricular relaxation (grade 1   diastolic dysfunction). - Aorta: Ascending aortic diameter: 43 mm (S). - Ascending aorta: The ascending aorta was mildly dilated. - Mitral valve: Mildly calcified annulus. - Right ventricle: The cavity size was normal. Systolic function   was mildly reduced.  Impressions: - Limited echo primariy focusing on LV. Minimal doppler, aortic and   mitral valves not fully interrogated. If need valve evaluation   (suspect there is a degree of aortic stenosis), will need to go   back for complete echo. EF 25-30%, diffuse hypokinesis.   Right and left heart cath 09/16/17:  Prox LAD lesion is 45% stenosed.  1st Diag lesion is 65% stenosed.  Hemodynamic findings consistent with moderate pulmonary hypertension.  1. Nonobstructive CAD 2. Elevated LV filling pressures with PCWP mean of 29 mm Hg 3. Moderate pulmonary HTN 4. Cardiac output 3.66 L/min with index of 2.07.   Plan: optimize medical therapy for CHF.    Echo 09/15/17: Study Conclusions - Left ventricle: The cavity size was mildly dilated. Wall thickness was increased in a pattern of mild LVH. The estimated ejection fraction was 20%. Diffuse hypokinesis. The study is not technically sufficient to allow evaluation of LV diastolic function. - Aortic valve: Mildly calcified leaflets. There was no stenosis. There was mild regurgitation. - Aorta: Ascending aorta dimension: 4.2 cm. - Ascending aorta: The ascending aorta was mildly dilated. - Mitral valve: Mildly thickened leaflets . There was mild regurgitation. - Left atrium: The atrium was normal in size. - Tricuspid valve: There was mild regurgitation. - Pulmonic valve: There was trivial regurgitation. - Pulmonary arteries: PA peak pressure: 44 mm Hg (S). - Inferior vena cava: The vessel was normal in size. The respirophasic diameter changes were in the normal range (>=  50%), consistent with normal central venous pressure.  Impressions: - LVEF 20%, global hypokinesis, mildly dilated LV with mild LVH, mild AI, dilated ascending aorta to 4.2 cm, mild MR, normal LA size, mild TR, trivial PI, RVSP 44 mmHg, normal IVC.    EKG:  EKG is  ordered today.  The ekg ordered today demonstrates sinus rhythm, HR 82  Recent Labs: 07/08/2017: ALT 16; TSH 1.22 09/14/2017: Pro B Natriuretic peptide (BNP) 992.0 09/16/2017: Hemoglobin 13.5; Platelets 155 01/06/2018: BUN 20; Creatinine, Ser 1.08; Potassium 4.7; Sodium 138  Recent Lipid Panel    Component Value Date/Time   CHOL 258 (H) 07/08/2017 1212   TRIG 109.0 07/08/2017 1212   HDL 68.70 07/08/2017 1212   CHOLHDL 4 07/08/2017 1212   VLDL 21.8 07/08/2017 1212   LDLCALC 167 (H) 07/08/2017 1212    Physical Exam:    VS:  BP 120/70   Pulse 82   Ht _0  (1.676 m)   Wt 171 lb (77.6 kg)   SpO2 97%   BMI 27.60 kg/m     Wt Readings from Last 3 Encounters:  02/17/18 171 lb (77.6 kg)  01/11/18 171 lb 2 oz (77.6 kg)  01/06/18 170 lb (77.1 kg)     GEN: Well nourished, well developed in no acute distress HEENT: Normal NECK: No JVD; No carotid bruits LYMPHATICS: No lymphadenopathy CARDIAC: RRR, no murmurs, rubs, gallops RESPIRATORY:  Clear to auscultation without rales, wheezing or rhonchi  ABDOMEN: Soft, non-tender, non-distended MUSCULOSKELETAL:  No edema; No deformity  SKIN: Warm and dry NEUROLOGIC:  Alert and oriented x 3 PSYCHIATRIC:  Normal affect   ASSESSMENT:    1. Nonischemic cardiomyopathy ([place])   2. Chronic systolic heart failure ([place])   3. Essential hypertension   4. Type 2 diabetes mellitus with complication, without long-term current use of insulin ([place])   5. Hyperlipidemia LDL goal <130    PLAN:    In order of problems listed above:  Nonischemic cardiomyopathy ([place]) Chronic systolic heart failure (H[facility]) HR on EKG today 82 bpm. Will increase coreg to 12.5 mg BID. He will take  daily blood pressures after increasing coreg and report back to our office. If he has adequate pressure, will then increase entresto dose.   He states his quality of life is quite poor because PCP has advised against his annual 7-10 day steroid taper for allergies.  He is unable to take Claritin and does not want to try zyrtec. I advised him that we are OK if he wants to take one week of a steroid taper. We discussed continuing to weigh daily. If he increases in weight 2 lbs in one day or 5 lbs in one week while on steroids, he will take an extra 20 mg of lasix. If this extra dose does not decrease his weight, he will call our office. I advised against twice weekly dosing of prednisone, but agreed to one week of steroid taper once yearly only for allergies.  Essential hypertension Medication changes as above. BP today 120/70, runs in the upper 120s at home.   Type 2 diabetes mellitus with complication, without long-term current use of insulin ([place]) Per primary.  Hyperlipidemia LDL goal <130 He was approved for praluent. Pharmacist spoke with patient about next steps for patient assistance. This patient assistance will help with the rest of his medications as well. Lipid clinic will follow up on this.    He will follow up in 6-8 weeks.   Medication Adjustments/Labs and Tests Ordered: Current medicines are reviewed at length with the patient today.  Concerns regarding medicines are outlined above.  Orders Placed This Encounter  Procedures  . EKG 12-Lead   Meds ordered this encounter  Medications  . carvedilol (COREG) 12.5 MG tablet    Sig: Take 1 tablet (12.5 mg total) by mouth 2 (two) times daily.    Dispense:  180 tablet    Refill:  3    DOSE INCREASE    Signed, Ledora Bottcher, [place]  02/17/2018 3:49 PM    [facility]

## 2018-02-17 NOTE — Patient Instructions (Signed)
Medication Instructions:  1. INCREASE COREG 12.5 MG 1 TABLET TWICE DAILY; NEW RX HAS BEEN SENT IN  Labwork: NONE ORDERED   Testing/Procedures: NONE ORDERED TODAY   Follow-Up: 6-8 WEEKS FOLLOW UP WITH SCOTT WEAVER, PAC   Any Other Special Instructions Will Be Listed Below (If Applicable). PER ANGIE DUKE , PAC IF YOUR PRIMARY CARE PHYSICIAN DOES PUT YOU ON A STEROID TAPER PAK; YOU WILL NEED TO : 1. WEIGH DAILY 2. IF WEIGHT IS UP 2 LB;S OR MORE IN 1 DAYS THEN TAKE AN EXTRA LASIX 20 MG THAT DAY 3. IF WEIGHT CONTINUES TO ELEVATED CALL OUR OFFICE [phone] FOR FURTHER RECOMMENDATIONS    If you need a refill on your cardiac medications before your next appointment, please call your pharmacy.

## 2018-02-18 ENCOUNTER — Telehealth: Payer: Self-pay

## 2018-02-18 NOTE — Telephone Encounter (Signed)
  Pt is calling looking for test results   Best number [phone]

## 2018-02-22 NOTE — Telephone Encounter (Signed)
The blood flow in his legs was normal

## 2018-02-22 NOTE — Telephone Encounter (Signed)
Pt informed of normal results.

## 2018-02-25 DIAGNOSIS — H0100B Unspecified blepharitis left eye, upper and lower eyelids: Secondary | ICD-10-CM | POA: Diagnosis not present

## 2018-02-25 DIAGNOSIS — H1045 Other chronic allergic conjunctivitis: Secondary | ICD-10-CM | POA: Diagnosis not present

## 2018-02-25 DIAGNOSIS — H5203 Hypermetropia, bilateral: Secondary | ICD-10-CM | POA: Diagnosis not present

## 2018-02-25 DIAGNOSIS — H0100A Unspecified blepharitis right eye, upper and lower eyelids: Secondary | ICD-10-CM | POA: Diagnosis not present

## 2018-02-26 ENCOUNTER — Telehealth: Payer: Self-pay | Admitting: Cardiovascular Disease

## 2018-02-26 NOTE — Telephone Encounter (Signed)
New Message  Pt c/o medication issue:  1. Name of Medication: carvedilol (COREG) 12.5 MG tablet  2. How are you currently taking this medication (dosage and times per day)? Take 1 tablet (12.5 mg total) by mouth 2 (two) times daily  3. Are you having a reaction (difficulty breathing--STAT)? no  4. What is your medication issue? Pt states that his bp was 98/59 two days ago and so yesterday he stopped all his meds and his bp went up to 128/80. Please call

## 2018-02-26 NOTE — Telephone Encounter (Signed)
Spoke with patient who is concerned about his medication increase and symptoms.  His Carvedilol was increased to 12.5 mg bid on 5/1.  Since then his BP has decreased to 98/59 and he has felt "fuzzy and balance is off".  He stopped the medication completely on 5/9 and his Bps have been 134/76 and 121/80 over time.  He did not take this morning dose of Carvedilol yet and I told him that we would further advise on this.

## 2018-03-01 MED ORDER — FUROSEMIDE 40 MG PO TABS: 20.0000 mg | ORAL_TABLET | Freq: Every day | ORAL | 3 refills | Status: DC

## 2018-03-01 MED ORDER — CARVEDILOL 6.25 MG PO TABS: 6.2500 mg | ORAL_TABLET | Freq: Two times a day (BID) | ORAL | 3 refills | Status: DC

## 2018-03-01 NOTE — Telephone Encounter (Signed)
Spoke with patient who c/o dizziness and hypotension since increasing Carvedilol to 12.5 mg twice daily on 5/1. He states he did not take any medication yesterday. Reports BP this morning: 107/65 mmHg, did not record pulse. States currently BP is 126/82 mmHg and pulse is 96 bpm. He denies weight gain or SOB. I spoke with Dr. Acie Fredrickson who is in the office and he advised that patient needs to resume Entresto 24-26 mg BID, decrease Carvedilol to 6.25 mg twice daily and decrease Furosemide to 20 mg daily. Patient verbalized understanding and agreement with plan and will continue to monitor BP and HR and call back to report readings in a few days. He thanked me for the call.

## 2018-03-01 NOTE — Telephone Encounter (Signed)
New Message   Pt c/o BP issue:  1. What are your last 5 BP readings? 124/75,  98/65 2. Are you having any other symptoms (ex. Dizziness, headache, blurred vision, passed out)? dizziness 3. What is your medication issue?   Patient followed the previous instructions and his BP was still low. So on Sunday he took no medication and his BP is at 124/75 and he feels better than he has felt in a few days

## 2018-03-09 NOTE — Telephone Encounter (Signed)
Called patient to find out how he is feeling since we made medication changes on 5/13. He denies dizziness at present and states he has felt not well but he thinks some of that is related to allergies because he cannot take antihistamines. He reports BP readings of: 110/70 mmHg, and 120/77. He states on one occasion his BP was 81'L systolic. He reports he would like to take the higher dose of Lasix because he feels that it keeps weight off of him. I advised that he may do so a few times per week but that taking lasix 40 mg daily will likely make his BP too low. He states he has an appointment on 6/5 with PCP and at our office on 6/19. He will continue to monitor BP and symptoms and will call back with questions or concerns prior to ov. He thanked me for the call.

## 2018-03-19 ENCOUNTER — Telehealth: Payer: Self-pay | Admitting: Cardiovascular Disease

## 2018-03-19 NOTE — Telephone Encounter (Signed)
Spoke with patient who called to report BP readings with Lasix therapy. He reports the following: 5/28 108/67 mmHg after Lasix 20 mg 5/29 132/83 mmHg before meds; 107/65 after Lasix 20 mg 5/30 124/80 before meds; 118/71 after Lasix 20 mg 5/31 125/71 after Lasix 40 mg  He has not been weighing himself daily. Denies SOB, states he does feel fatigued frequently. Patient became tearful and says he does not have a good quality of life and that he just wants to live long enough to be with his dog, Striker. He states Dr. Acie Fredrickson told him to stay away from prednisone but that taking prednisone is the only way he is able to walk without pain. I advised that Dr. Acie Fredrickson most likely cautioned him against using prednisone consistently but did not prohibit it completely. I advised that our goal is to improve his heart function and to manage his BP so that he feels well and has good quality of life. I encouraged him to weigh himself daily as this is the best way to monitor heart failure. I advised him to continue to monitor BP and to call back with questions or concerns prior to f/u appointment in June. He verbalized understanding and agreement and thanked me for the call.

## 2018-03-19 NOTE — Telephone Encounter (Signed)
Pt calling'    Pt is calling to give you an update on him taking Lasix. Please call pt.

## 2018-03-22 NOTE — Telephone Encounter (Signed)
Agree with note by Michelle Swinyer, RN  

## 2018-03-24 ENCOUNTER — Encounter: Payer: Medicare Other | Admitting: Internal Medicine

## 2018-03-29 ENCOUNTER — Encounter: Payer: Self-pay | Admitting: Family Medicine

## 2018-03-29 ENCOUNTER — Ambulatory Visit (INDEPENDENT_AMBULATORY_CARE_PROVIDER_SITE_OTHER): Payer: Medicare Other | Admitting: Family Medicine

## 2018-03-29 VITALS — BP 122/80 | HR 98 | Ht 66.0 in | Wt 171.2 lb

## 2018-03-29 DIAGNOSIS — I10 Essential (primary) hypertension: Secondary | ICD-10-CM | POA: Diagnosis not present

## 2018-03-29 DIAGNOSIS — G5793 Unspecified mononeuropathy of bilateral lower limbs: Secondary | ICD-10-CM | POA: Diagnosis not present

## 2018-03-29 DIAGNOSIS — E118 Type 2 diabetes mellitus with unspecified complications: Secondary | ICD-10-CM

## 2018-03-29 DIAGNOSIS — J301 Allergic rhinitis due to pollen: Secondary | ICD-10-CM

## 2018-03-29 DIAGNOSIS — I5022 Chronic systolic (congestive) heart failure: Secondary | ICD-10-CM

## 2018-03-29 DIAGNOSIS — I428 Other cardiomyopathies: Secondary | ICD-10-CM

## 2018-03-29 DIAGNOSIS — I2510 Atherosclerotic heart disease of native coronary artery without angina pectoris: Secondary | ICD-10-CM

## 2018-03-29 HISTORY — DX: Allergic rhinitis due to pollen: J30.1

## 2018-03-29 LAB — BASIC METABOLIC PANEL
BUN: 21 mg/dL (ref 6–23)
CO2: 27 mEq/L (ref 19–32)
Calcium: 9.8 mg/dL (ref 8.4–10.5)
Chloride: 102 mEq/L (ref 96–112)
Creatinine, Ser: 0.93 mg/dL (ref 0.40–1.50)
GFR: 83.56 mL/min (ref >60.00)
Glucose, Bld: 111 mg/dL — ABNORMAL HIGH (ref 70–99)
POTASSIUM: 4.3 meq/L (ref 3.5–5.1)
SODIUM: 138 meq/L (ref 135–145)

## 2018-03-29 LAB — CBC
HCT: 45.2 % (ref 39.0–52.0)
Hemoglobin: 15.2 g/dL (ref 13.0–17.0)
MCHC: 33.6 g/dL (ref 30.0–36.0)
MCV: 88.1 fl (ref 78.0–100.0)
PLATELETS: 225 10*3/uL (ref 150.0–400.0)
RBC: 5.14 Mil/uL (ref 4.22–5.81)
RDW: 14.3 % (ref 11.5–15.5)
WBC: 5.7 10*3/uL (ref 4.0–10.5)

## 2018-03-29 MED ORDER — MOMETASONE FUROATE 50 MCG/ACT NA SUSP: 2.0000 | Freq: Every day | NASAL | 12 refills | Status: DC

## 2018-03-29 NOTE — Patient Instructions (Signed)

## 2018-03-29 NOTE — Progress Notes (Signed)
Subjective:  Patient ID: Christian [name], male    DOB: 1941/02/07  Age: 77 y.o. MRN: [id number]  CC: Annual Exam   HPI Christian [name] presents for evaluation of allergy rhinitis signs and symptoms.  He has itchy watery eyes nasal congestion with postnasal drip and itchy ears.  There is sneezing and some cough.  He denies wheezing or reactive airway disease.  He also reports chronic intermittent paresthesias involving his lower extremities. He experiences myalgias in his calf muscles that he believes to be statin induced. Recent nerve conduction studies conducted back in the fall did not show evidence for a diffuse myopathy or a polyneuropathy.  Patient is currently not using a cane or walker for ambulatory assistance.  He says that oral steroids have helped in the past.  He is a borderline diabetic and and chronic heart failure. He admits that with the last round of oral steroids, he had wound up in the ER with what sounds like flash pulmonary edema.   He is a retired Optometrist.  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  He is no longer living with his third wife but she continues to help him with his rescue efforts.  He admits that he is only living for his beloved Doberman, Christian [name].  Christian [name] is 76 years old.  He realizes that he could lose this dog at any time.  When his dog passes he feels that he will no longer be interested in continuing living.  Outpatient Medications Prior to Visit  Medication Sig Dispense Refill  . aspirin EC 81 MG tablet Take 1 tablet (81 mg total) by mouth daily. 90 tablet 3  . carvedilol (COREG) 6.25 MG tablet Take 1 tablet (6.25 mg total) by mouth 2 (two) times daily. 180 tablet 3  . cholecalciferol (VITAMIN D) 1000 units tablet Take 1,000 Units by mouth daily.    Marland Kitchen esomeprazole (NEXIUM) 40 MG capsule Take 40 mg by mouth as needed (for heartburn).    . furosemide (LASIX) 40 MG tablet Take 0.5 tablets (20 mg total) by mouth daily. 90  tablet 3  . olopatadine (PATANOL) 0.1 % ophthalmic solution Place 1 drop into both eyes as needed for allergies.     . sacubitril-valsartan (ENTRESTO) 24-26 MG Take 1 tablet by mouth 2 (two) times daily. 60 tablet 3  . traMADol (ULTRAM) 50 MG tablet Take 1 tablet (50 mg total) by mouth every 12 (twelve) hours as needed. 60 tablet 5   No facility-administered medications prior to visit.     ROS Review of Systems  Constitutional: Negative for chills, fatigue, fever and unexpected weight change.  HENT: Positive for congestion, postnasal drip, rhinorrhea and sneezing. Negative for hearing loss, nosebleeds, sinus pressure, sinus pain, trouble swallowing and voice change.   Eyes: Positive for itching.  Respiratory: Negative for cough and wheezing.   Cardiovascular: Negative.   Gastrointestinal: Negative.   Endocrine: Negative for polyphagia and polyuria.  Genitourinary: Negative for frequency.  Musculoskeletal: Positive for gait problem and myalgias.  Skin: Negative for color change and rash.  Allergic/Immunologic: Positive for environmental allergies.  Neurological: Positive for numbness. Negative for headaches.  Hematological: Does not bruise/bleed easily.  Psychiatric/Behavioral: Positive for dysphoric mood. Negative for self-injury and suicidal ideas. The patient is nervous/anxious.     Objective:  BP 122/80   Pulse 98   Ht 5\' 6"  (1.676 m)   Wt 171 lb 4 oz (77.7 kg)   SpO2 95%  BMI 27.64 kg/m   BP Readings from Last 3 Encounters:  03/29/18 122/80  02/17/18 120/70  01/11/18 110/80    Wt Readings from Last 3 Encounters:  03/29/18 171 lb 4 oz (77.7 kg)  02/17/18 171 lb (77.6 kg)  01/11/18 171 lb 2 oz (77.6 kg)    Physical Exam  Constitutional: He is oriented to person, place, and time. He appears well-developed and well-nourished. No distress.  HENT:  Head: Normocephalic and atraumatic.  Right Ear: External ear normal.  Left Ear: External ear normal.  Eyes: Right eye  exhibits no discharge. Left eye exhibits no discharge. No scleral icterus.  Neck: No JVD present. No tracheal deviation present.  Pulmonary/Chest: Effort normal.  Neurological: He is alert and oriented to person, place, and time.  Skin: He is not diaphoretic.  Psychiatric: He has a normal mood and affect. His behavior is normal.   Depression screen [facility] 2/9 03/29/2018 07/08/2017  Decreased Interest 1 3  Down, Depressed, Hopeless 0 1  PHQ - 2 Score 1 4  Altered sleeping 0 1  Tired, decreased energy 0 2  Change in appetite 0 0  Feeling bad or failure about yourself  1 0  Trouble concentrating 0 0  Moving slowly or fidgety/restless 0 0  Suicidal thoughts 1 0  PHQ-9 Score 3 7  Difficult doing work/chores - Not difficult at all   Pt denies thoughts of self harm but says that he is only living for his dog.   Lab Results  Component Value Date   WBC 9.7 09/16/2017   HGB 13.5 09/16/2017   HCT 43.0 09/16/2017   PLT 155 09/16/2017   GLUCOSE 148 (H) 01/06/2018   CHOL 258 (H) 07/08/2017   TRIG 109.0 07/08/2017   HDL 68.70 07/08/2017   LDLCALC 167 (H) 07/08/2017   ALT 16 07/08/2017   AST 15 07/08/2017   NA 138 01/06/2018   K 4.7 01/06/2018   CL 103 01/06/2018   CREATININE 1.08 01/06/2018   BUN 20 01/06/2018   CO2 25 01/06/2018   TSH 1.22 07/08/2017   INR 1.07 09/16/2017   HGBA1C 6.3 11/30/2017   MICROALBUR 3.2 (H) 01/06/2018    No results found.  Assessment & Plan:   Christian [name] was seen today for annual exam.  Diagnoses and all orders for this visit:  Essential hypertension  Chronic systolic heart failure ([place])  Nonischemic cardiomyopathy ([place])  Coronary artery disease involving native coronary artery of native heart without angina pectoris  Type 2 diabetes mellitus with complication, without long-term current use of insulin ([facility]) -     CBC -     Basic metabolic panel  Allergic rhinitis due to pollen, unspecified seasonality -     mometasone (NASONEX) 50 MCG/ACT nasal  spray; Place 2 sprays into the nose daily.  Neuropathy involving both lower extremities   I have discontinued Christian [name]'s traMADol. I am also having him start on mometasone. Additionally, I am having him maintain his olopatadine, aspirin EC, cholecalciferol, esomeprazole, sacubitril-valsartan, furosemide, and carvedilol.  Meds ordered this encounter  Medications  . mometasone (NASONEX) 50 MCG/ACT nasal spray    Sig: Place 2 sprays into the nose daily.    Dispense:  17 g    Refill:  12   Patient will give Nasonex a month-long trial.  Advised him that it may take up to 2 weeks for her to begin to work for him.  Had a long discussion that oral or injectable steroids are not indicated for him  with his concurrent illnesses.  Strongly encouraged him to purchase a walker or at least a cane to steady his gait.  Reminded him that after serious fall he may not be able to care for his beloved Doberman.  Asked him to follow-up in a month.  Follow-up: Return in about 1 month (around 04/28/2018).  Libby Maw, MD

## 2018-04-02 ENCOUNTER — Telehealth: Payer: Self-pay

## 2018-04-02 NOTE — Telephone Encounter (Signed)
Patient was supposed to have cpe lab done on 06/10  however there are labs missing and no urinalysis done. No cholesterol done.

## 2018-04-02 NOTE — Telephone Encounter (Signed)
Patient is wanting to know why a CPE & CPE labs weren't done during his visit on 03/29/18.

## 2018-04-02 NOTE — Telephone Encounter (Signed)
I called and left a voicemail for patient. Dr. Ethelene Hal did not order CPE labs because patient was not fasting & multiple things were discussed at the visit. Dr. Ethelene Hal wants to see patient back next month & pt can come fasting & we will do blood work  then. Okay for triage nurse to discuss with patient.

## 2018-04-02 NOTE — Telephone Encounter (Signed)
Pt was non fasting at that time and presented with  many medical issues that needed to be addressed. He can return as directed in one month fasting for a physical exam.  This is

## 2018-04-07 ENCOUNTER — Encounter: Payer: Self-pay | Admitting: Physician Assistant

## 2018-04-07 ENCOUNTER — Ambulatory Visit: Payer: Medicare Other | Admitting: Physician Assistant

## 2018-04-07 VITALS — BP 124/86 | HR 86 | Ht 66.0 in | Wt 172.1 lb

## 2018-04-07 DIAGNOSIS — I5022 Chronic systolic (congestive) heart failure: Secondary | ICD-10-CM | POA: Diagnosis not present

## 2018-04-07 DIAGNOSIS — I10 Essential (primary) hypertension: Secondary | ICD-10-CM

## 2018-04-07 DIAGNOSIS — I2510 Atherosclerotic heart disease of native coronary artery without angina pectoris: Secondary | ICD-10-CM | POA: Diagnosis not present

## 2018-04-07 DIAGNOSIS — E785 Hyperlipidemia, unspecified: Secondary | ICD-10-CM

## 2018-04-07 MED ORDER — FUROSEMIDE 20 MG PO TABS: 20.0000 mg | ORAL_TABLET | ORAL | 3 refills | Status: DC

## 2018-04-07 NOTE — Progress Notes (Signed)
Cardiology Office Note:    Date:  04/07/2018   ID:  [name], DOB May 02, 1941, MRN [id number]  PCP:  Christian Maw, MD  Cardiologist:  Christian Moores, MD   Referring MD: Christian Lima, MD   Chief Complaint  Patient presents with  . Follow-up    CHF    History of Present Illness:    Christian [name] is a 77 y.o. male with systolic heart failure secondary to nonischemic cardiomyopathy, nonobstructive coronary artery disease, diabetes, hypertension, hyperlipidemia, neuropathy, idiopathic inflammatory myopathy.  Last seen in clinic 02/17/2018.  [name] returns for follow up.  He is here alone.  He denies significant shortness of breath.  He has an occasional fullness sensation in his chest.  This is unchanged.  He denies orthopnea, paroxysmal nocturnal dyspnea, edema.  He denies syncope but does get lightheaded sometimes.  He did show me a copy of labs from a recent visit with his PCP (BMET, CBC).  He was under the impression he was getting a full physical exam and expected to get a full set of labs (CMET, CBC, Lipids, UA).  He would like me to draw lab for him today.   Prior CV studies:   The following studies were reviewed today:  Echo 12/18/2017 Mild LVH, EF 25-30, diffuse HK, grade 1 diastolic dysfunction, mildly dilated ascending aorta (43 mm), MAC, mildly reduced RVSF  Right/left cardiac catheterization 09/16/17 LAD proximal 45, ostial D1 65 Mean RA 11, PASP 54, mean PA 39 LVEDP 31, mean wedge 29 1. Nonobstructive CAD 2. Elevated LV filling pressures with PCWP mean of 29 mm Hg 3. Moderate pulmonary HTN 4. Cardiac output 3.66 L/min with index of 2.07.   Echocardiogram 09/15/17 Mild LVH, EF 20, diffuse HK mild AI, a sending aorta 42 mm (mildly dilated), mild MR, mild TR, trivial PI, PASP 44  Past Medical History:  Diagnosis Date  . Chronic systolic heart failure ([place])    Echo 11/18: Mild LVH, EF 20, diffuse HK mild AI, a sending aorta 42 mm (mildly dilated), mild  MR, mild TR, trivial PI, PASP 44 // R/L heart cath 11/18: Nonobstructive CAD, elevated PCWP (mean 29), moderate pulmonary hypertension, CO 3.66, CI 2.07 // Echo 3/19: mild LVH, EF 25-30, diff HK, Gr 1 DD, asc aorta 43 mm (mildly dilated), MAC, mild reduced RVSF   . Coronary artery disease involving native coronary artery of native heart without angina pectoris 11/11/2017   LHC 11/18: pLAD 45, oD1 65  . Hernia of abdominal cavity   . Hyperlipidemia   . Nonischemic cardiomyopathy ([place]) 11/11/2017   LHC 09/16/17-nonobstructive CAD   Surgical Hx: The patient  has a past surgical history that includes Back surgery; Spine surgery; Hernia repair; and RIGHT/LEFT HEART CATH AND CORONARY ANGIOGRAPHY (N/A, 09/16/2017).   Current Medications: Current Meds  Medication Sig  . aspirin EC 81 MG tablet Take 1 tablet (81 mg total) by mouth daily.  . carvedilol (COREG) 6.25 MG tablet Take 1 tablet (6.25 mg total) by mouth 2 (two) times daily.  . cholecalciferol (VITAMIN D) 1000 units tablet Take 1,000 Units by mouth daily.  Marland Kitchen esomeprazole (NEXIUM) 40 MG capsule Take 40 mg by mouth as needed (for heartburn).  . furosemide (LASIX) 40 MG tablet Take 0.5 tablets (20 mg total) by mouth daily.  . mometasone (NASONEX) 50 MCG/ACT nasal spray Place 2 sprays into the nose daily.  Marland Kitchen olopatadine (PATANOL) 0.1 % ophthalmic solution Place 1 drop into both eyes as needed for allergies.   Marland Kitchen  sacubitril-valsartan (ENTRESTO) 24-26 MG Take 1 tablet by mouth 2 (two) times daily.     Allergies:   Amoxicillin; Bactrim [sulfamethoxazole-trimethoprim]; Cephalexin; Hydrocodone; Lexapro [escitalopram oxalate]; Lipitor [atorvastatin]; Oxycodone; Statins; and Vytorin [ezetimibe-simvastatin]   Social History   Tobacco Use  . Smoking status: Never Smoker  . Smokeless tobacco: Never Used  Substance Use Topics  . Alcohol use: No  . Drug use: No     Family Hx: The patient's family history includes Early death in his father;  Hypertension in his father; Kidney disease in his father; Stroke in his father and mother. There is no history of Cancer, Alcohol abuse, Diabetes, Heart disease, or Hyperlipidemia.  ROS:   Please see the history of present illness.    Review of Systems  Musculoskeletal: Positive for myalgias.   All other systems reviewed and are negative.   EKGs/Labs/Other Test Reviewed:    EKG:  EKG is not ordered today.    Recent Labs: 07/08/2017: ALT 16; TSH 1.22 09/14/2017: Pro B Natriuretic peptide (BNP) 992.0 03/29/2018: BUN 21; Creatinine, Ser 0.93; Hemoglobin 15.2; Platelets 225.0; Potassium 4.3; Sodium 138   Recent Lipid Panel Lab Results  Component Value Date/Time   CHOL 258 (H) 07/08/2017 12:12 PM   TRIG 109.0 07/08/2017 12:12 PM   HDL 68.70 07/08/2017 12:12 PM   CHOLHDL 4 07/08/2017 12:12 PM   LDLCALC 167 (H) 07/08/2017 12:12 PM    Physical Exam:    VS:  BP 124/86   Pulse 86   Ht _0  (1.676 m)   Wt 172 lb 1.9 oz (78.1 kg)   BMI 27.78 kg/m     Wt Readings from Last 3 Encounters:  04/07/18 172 lb 1.9 oz (78.1 kg)  03/29/18 171 lb 4 oz (77.7 kg)  02/17/18 171 lb (77.6 kg)     Physical Exam  Constitutional: He is oriented to person, place, and time. He appears well-developed and well-nourished. No distress.  HENT:  Head: Normocephalic and atraumatic.  Neck: Neck supple. No JVD present.  Cardiovascular: Normal rate, regular rhythm, S1 normal and S2 normal.  No murmur heard. Pulmonary/Chest: Breath sounds normal. He has no rales.  Abdominal: Soft. There is no hepatomegaly.  Musculoskeletal: He exhibits no edema.  Neurological: He is alert and oriented to person, place, and time.  Skin: Skin is warm and dry.    ASSESSMENT & PLAN:    Chronic systolic heart failure (HCC) Non-ischemic CM.  EF 25-30.  NYHA 2.  His volume is stable.  BP has been running low.  This limits further titration of his medication.  I will adjust his Lasix to see if we can further titrate his  beta-blocker.    -Continue Entresto  -Decrease Lasix to 20 mg every Mon, Wed, Fri  -He will send BP readings in 2 weeks  -If BP higher, try to increase Coreg to 12.5 mg bid    -Consider Echo at some point once he is on maximal tolerated therapy  -Consider Ivabridine if BP limits titration of beta-blocker   Essential hypertension The patient's blood pressure is controlled on his current regimen.  Continue current therapy.  I will obtain a UA per his request.   Hyperlipidemia LDL goal <130 He was approved for PCSK9 inhibitor but has not started.  He prefers to have his Lipids checked today as noted above.    -Check Lipids and LFTs  -FU with Lipid clinic as planned  Coronary artery disease involving native coronary artery of native heart without angina pectoris  Non-obstructive disease by cath.  Continue ASA.  He is intol of statins.  He denies angina.     Dispo:  Return in about 2 months (around 06/07/2018) for Routine Follow Up, w/ Dr. Acie Fredrickson, w/ Richardson Dopp, PA-C.   Medication Adjustments/Labs and Tests Ordered: Current medicines are reviewed at length with the patient today.  Concerns regarding medicines are outlined above.  Tests Ordered: Orders Placed This Encounter  Procedures  . Lipid Profile  . Hepatic function panel  . Urinalysis   Medication Changes: Meds ordered this encounter  Medications  . furosemide (LASIX) 20 MG tablet    Sig: Take 1 tablet (20 mg total) by mouth every Monday, Wednesday, and Friday.    Dispense:  45 tablet    Refill:  3    FREQUENCY CHANGE    Signed, Richardson Dopp, PA-C  04/07/2018 4:34 PM    [facility] [address], [place], [place]  [address] Phone: [phone]; Fax: [phone]

## 2018-04-07 NOTE — Patient Instructions (Addendum)
Medication Instructions:  Change Lasix (Furosemide) to 20 mg three times a week (Monday, Wednesday, Friday)  Labwork: Today - Lipids, LFTs, Urinalysis   Testing/Procedures: None   Follow-Up: Mertie Moores, MD or Richardson Dopp, PA-C in 2 months.  Any Other Special Instructions Will Be Listed Below (If Applicable).  Weigh daily - weigh at the same time each day (first thing in the morning)  -If your weight increases by 3 or more pounds in 1 day or 5 pounds in 1 week, call our office.  Check your blood pressure daily for 2 weeks and send me the readings.  If you need a refill on your cardiac medications before your next appointment, please call your pharmacy.

## 2018-04-08 ENCOUNTER — Telehealth: Payer: Self-pay | Admitting: Pharmacist

## 2018-04-08 LAB — URINALYSIS
Bilirubin, UA: NEGATIVE
Glucose, UA: NEGATIVE
KETONES UA: NEGATIVE
Leukocytes, UA: NEGATIVE
NITRITE UA: NEGATIVE
PH UA: 5 (ref 5.0–7.5)
Protein, UA: NEGATIVE
RBC UA: NEGATIVE
SPEC GRAV UA: 1.018 (ref 1.005–1.030)
UUROB: 0.2 mg/dL (ref 0.2–1.0)

## 2018-04-08 LAB — HEPATIC FUNCTION PANEL
ALT: 14 IU/L (ref 0–44)
AST: 18 IU/L (ref 0–40)
Albumin: 4.5 g/dL (ref 3.5–4.8)
Alkaline Phosphatase: 46 IU/L (ref 39–117)
BILIRUBIN TOTAL: 1.1 mg/dL (ref 0.0–1.2)
Bilirubin, Direct: 0.24 mg/dL (ref 0.00–0.40)
Total Protein: 6.7 g/dL (ref 6.0–8.5)

## 2018-04-08 LAB — LIPID PANEL
CHOL/HDL RATIO: 6.1 ratio — AB (ref 0.0–5.0)
Cholesterol, Total: 270 mg/dL — ABNORMAL HIGH (ref 100–199)
HDL: 44 mg/dL (ref >39)
LDL Calculated: 197 mg/dL — ABNORMAL HIGH (ref 0–99)
Triglycerides: 145 mg/dL (ref 0–149)
VLDL CHOLESTEROL CAL: 29 mg/dL (ref 5–40)

## 2018-04-08 NOTE — Telephone Encounter (Signed)
LDL checked yesterday at [facility] with Richardson Dopp and was elevated at 197. Pt was already approved for Praluent but copay was too high. He is currently denied from patient assistance through the [place] program until he applies for Medicare Low Income Subsidy program. I discussed this with pt back in April. Called pt to see if he has applied and he states he has "not gotten around to it yet" since we spoke 2 months ago. Advised pt he needs to complete this application since it is the only way he can start Praluent for free, and that his LDL is currently dangerously high. He states he will fill out the application in the next week or two.

## 2018-05-12 ENCOUNTER — Telehealth: Payer: Self-pay | Admitting: Family Medicine

## 2018-05-12 ENCOUNTER — Other Ambulatory Visit: Payer: Self-pay | Admitting: Cardiovascular Disease

## 2018-05-12 DIAGNOSIS — L309 Dermatitis, unspecified: Secondary | ICD-10-CM | POA: Diagnosis not present

## 2018-05-12 DIAGNOSIS — B351 Tinea unguium: Secondary | ICD-10-CM | POA: Diagnosis not present

## 2018-05-12 DIAGNOSIS — D229 Melanocytic nevi, unspecified: Secondary | ICD-10-CM | POA: Diagnosis not present

## 2018-05-12 NOTE — Telephone Encounter (Signed)
Left message for patient to make aware office practice administrator received letter that was mailed to office and Practice Administrator is checking into it. Also stated to call office if any questions.

## 2018-05-13 ENCOUNTER — Telehealth: Payer: Self-pay | Admitting: Physician Assistant

## 2018-05-13 NOTE — Telephone Encounter (Signed)
I received BP readings from the patient and have reviewed them. His BP is ok but I think he can tolerate more Coreg.  I would like to increase the dose.  PLAN: 1. Increase Carvedilol (Coreg) to 9.375 mg Twice daily  2. Follow up as planned. 3. If BP less than 90 systolic with symptoms of fatigue, dizziness, etc >> call Richardson Dopp, PA-C    05/13/2018 10:30 AM

## 2018-05-13 NOTE — Telephone Encounter (Signed)
Spoke with the patient and gave him the recommendations in regards to his recent Bps.  He verbalized understanding.

## 2018-05-14 ENCOUNTER — Telehealth: Payer: Self-pay | Admitting: Family Medicine

## 2018-05-14 NOTE — Telephone Encounter (Signed)
Attempted to contact patient ref. Questions/answer from 6/10 visit.  Home # listed in demographics is for a [employer].  Will call again Monday to resolve.

## 2018-05-17 NOTE — Telephone Encounter (Signed)
Thank you Nancy

## 2018-05-18 ENCOUNTER — Telehealth: Payer: Self-pay | Admitting: Family Medicine

## 2018-05-18 NOTE — Telephone Encounter (Signed)
Called patient again.  Left message on recorder.  Left my office number for return call.  Advised have been trying to reach him per his request.

## 2018-05-26 ENCOUNTER — Encounter: Payer: Self-pay | Admitting: Family Medicine

## 2018-06-02 NOTE — Telephone Encounter (Signed)
LMOM for pt to see if he has filled out LIS application yet.

## 2018-06-03 NOTE — Telephone Encounter (Signed)
Pt returned call to clinic. He states it took a month for Medicare office to send him the LIS application. He submitted his LIS application 2-3 weeks ago and has not heard back yet. Advised pt to be on the lookout for a decision from them and to let us know once he has received a determination so that we can assist with patient assistance.

## 2018-06-15 ENCOUNTER — Telehealth: Payer: Self-pay | Admitting: Family Medicine

## 2018-06-15 NOTE — Telephone Encounter (Signed)
Reached patient via telephone.  He advised he does not want to establish at [facility] as the drive is too long.  Prefers [name].  Requested to talk with Engineer, building services at [facility].  She has been advised, and has agreed to contact patient to discuss.

## 2018-06-16 ENCOUNTER — Telehealth: Payer: Self-pay

## 2018-06-16 NOTE — Telephone Encounter (Signed)
Spoke with patient to discuss status of LIS application. Patient reports he submitted all paper work last week. Will resubmit PA for Praluent so he is able to receive medication once he is approved for LIS.   [name], Sherian Rein D PGY1 Pharmacy Resident  Phone [phone] 06/16/2018      11:08 AM

## 2018-06-22 MED ORDER — ALIROCUMAB 75 MG/ML ~~LOC~~ SOPN: 75.0000 mg | PEN_INJECTOR | SUBCUTANEOUS | 11 refills | Status: DC

## 2018-06-22 NOTE — Addendum Note (Signed)
Addended by: Erskine Emery on: 06/22/2018 07:47 AM   Modules accepted: Orders

## 2018-06-22 NOTE — Telephone Encounter (Signed)
VM left on machine over weekend that patient is approved for Praluent through insurance. Will send Praluent to pharmacy.

## 2018-06-23 ENCOUNTER — Encounter (INDEPENDENT_AMBULATORY_CARE_PROVIDER_SITE_OTHER): Payer: Self-pay

## 2018-06-23 ENCOUNTER — Ambulatory Visit: Payer: Medicare Other | Admitting: Physician Assistant

## 2018-06-23 ENCOUNTER — Encounter: Payer: Self-pay | Admitting: Physician Assistant

## 2018-06-23 VITALS — BP 108/74 | HR 73 | Ht 66.0 in | Wt 171.1 lb

## 2018-06-23 DIAGNOSIS — I5022 Chronic systolic (congestive) heart failure: Secondary | ICD-10-CM | POA: Diagnosis not present

## 2018-06-23 DIAGNOSIS — E785 Hyperlipidemia, unspecified: Secondary | ICD-10-CM

## 2018-06-23 DIAGNOSIS — I2510 Atherosclerotic heart disease of native coronary artery without angina pectoris: Secondary | ICD-10-CM | POA: Diagnosis not present

## 2018-06-23 DIAGNOSIS — I10 Essential (primary) hypertension: Secondary | ICD-10-CM | POA: Diagnosis not present

## 2018-06-23 MED ORDER — CARVEDILOL 6.25 MG PO TABS: 9.3750 mg | ORAL_TABLET | Freq: Two times a day (BID) | ORAL | 3 refills | Status: DC

## 2018-06-23 NOTE — Addendum Note (Signed)
Addended by: Michae Kava on: 06/23/2018 03:13 PM   Modules accepted: Orders

## 2018-06-23 NOTE — Patient Instructions (Signed)
Medication Instructions:  1. Your physician recommends that you continue on your current medications as directed. Please refer to the Current Medication list given to you today.   Labwork: NONE ORDERED TODAY  Testing/Procedures: Your physician has requested that you have an LIMITED echocardiogram. Echocardiography is a painless test that uses sound waves to create images of your heart. It provides your doctor with information about the size and shape of your heart and how well your heart's chambers and valves are working. This procedure takes approximately one hour. There are no restrictions for this procedure.    Follow-Up: 6 MONTHS WITH DR. Acie Fredrickson OR SCOTT WEAVER, PAC   Any Other Special Instructions Will Be Listed Below (If Applicable).     If you need a refill on your cardiac medications before your next appointment, please call your pharmacy.

## 2018-06-23 NOTE — Progress Notes (Signed)
Cardiology Office Note:    Date:  06/23/2018   ID:  [name], DOB December 03, 1940, MRN [id number]  PCP:  Patient, No Pcp Per  Cardiologist:  Mertie Moores, MD    Referring MD: Libby Maw,*   Chief Complaint  Patient presents with  . Follow-up    CHF     History of Present Illness:    [name] is a 77 y.o. male with systolic heart failure secondary to nonischemic cardiomyopathy, nonobstructive coronary artery disease, diabetes, hypertension, hyperlipidemia, neuropathy, idiopathic inflammatory myopathy.  He was last seen in June 2019.  I reduced his diuretic to see if we can increase his dose of beta-blocker.  He is intolerant of statins.  He has been seen by our lipid clinic and is working on Systems developer for [employer].  He needs to fill out some papers and send them back to the company.     [name] returns for follow-up.  He is here alone.  He has been doing well.  He denies chest discomfort.  He remains quite active without shortness of breath.  He has occasional dizziness but denies syncope.  He denies PND, lower extremity swelling.  Prior CV studies:   The following studies were reviewed today:  Echo 12/18/2017 Mild LVH, EF 25-30, diffuse HK, grade 1 diastolic dysfunction, mildly dilated ascending aorta (43 mm), MAC, mildly reduced RVSF   Right/left cardiac catheterization 09/16/17 LAD proximal 45, ostial D1 65 Mean RA 11, PASP 54, mean PA 39 LVEDP 31, mean wedge 29 1. Nonobstructive CAD 2. Elevated LV filling pressures with PCWP mean of 29 mm Hg 3. Moderate pulmonary HTN 4. Cardiac output 3.66 L/min with index of 2.07.    Echocardiogram 09/15/17 Mild LVH, EF 20, diffuse HK mild AI, a sending aorta 42 mm (mildly dilated), mild MR, mild TR, trivial PI, PASP 44   Past Medical History:  Diagnosis Date  . Allergic rhinitis due to pollen 03/29/2018  . Chronic systolic heart failure ([place])    Echo 11/18: Mild LVH, EF 20, diffuse HK mild AI, a sending aorta 42  mm (mildly dilated), mild MR, mild TR, trivial PI, PASP 44 // R/L heart cath 11/18: Nonobstructive CAD, elevated PCWP (mean 29), moderate pulmonary hypertension, CO 3.66, CI 2.07 // Echo 3/19: mild LVH, EF 25-30, diff HK, Gr 1 DD, asc aorta 43 mm (mildly dilated), MAC, mild reduced RVSF   . Coronary artery disease involving native coronary artery of native heart without angina pectoris 11/11/2017   LHC 11/18: pLAD 45, oD1 65  . Depression with anxiety 07/08/2017  . Essential hypertension 07/08/2017  . GERD with esophagitis 01/11/2018  . Hernia of abdominal cavity   . Hyperlipidemia   . Hyperlipidemia LDL goal <130 05/25/2017   The 10-year ASCVD risk score [name]., et al., 2013) is: 31.1%   Values used to calculate the score:     Age: 73 years     Sex: Male     Is Non-Hispanic African American: No     Diabetic: No     Tobacco smoker: No     Systolic Blood Pressure: 160 mmHg     Is BP treated: No     HDL Cholesterol: 68.7 mg/dL     Total Cholesterol: 258 mg/dL   . Idiopathic inflammatory myopathy 05/25/2017  . Neuropathy involving both lower extremities 05/25/2017  . Nonischemic cardiomyopathy ([place]) 11/11/2017   LHC 09/16/17-nonobstructive CAD  . Oropharyngeal dysphagia 01/11/2018  . Type 2 diabetes mellitus with complication,  without long-term current use of insulin ([place]) 07/08/2017   Surgical Hx: The patient  has a past surgical history that includes Back surgery; Spine surgery; Hernia repair; and RIGHT/LEFT HEART CATH AND CORONARY ANGIOGRAPHY (N/A, 09/16/2017).   Current Medications: Current Meds  Medication Sig  . Alirocumab (PRALUENT) 75 MG/ML SOPN Inject 75 mg into the skin every 14 (fourteen) days.  Marland Kitchen aspirin EC 81 MG tablet Take 1 tablet (81 mg total) by mouth daily.  . carvedilol (COREG) 6.25 MG tablet Take 1 tablet (6.25 mg total) by mouth 2 (two) times daily.  . cholecalciferol (VITAMIN D) 1000 units tablet Take 1,000 Units by mouth daily.  Marland Kitchen ENTRESTO 24-26 MG TAKE 1 TABLET BY MOUTH TWICE  DAILY  . esomeprazole (NEXIUM) 40 MG capsule Take 40 mg by mouth as needed (for heartburn).  . furosemide (LASIX) 40 MG tablet Take half (1/2) tablet (20 mg) by mouth three (3) times per week. (Mondays, Wednesdays, and Fridays)  . mometasone (NASONEX) 50 MCG/ACT nasal spray Place 2 sprays into the nose daily.  Marland Kitchen olopatadine (PATANOL) 0.1 % ophthalmic solution Place 1 drop into both eyes as needed for allergies.      Allergies:   Amoxicillin; Bactrim [sulfamethoxazole-trimethoprim]; Cephalexin; Hydrocodone; Lexapro [escitalopram oxalate]; Lipitor [atorvastatin]; Oxycodone; Statins; and Vytorin [ezetimibe-simvastatin]   Social History   Tobacco Use  . Smoking status: Never Smoker  . Smokeless tobacco: Never Used  Substance Use Topics  . Alcohol use: No  . Drug use: No     Family Hx: The patient's family history includes Early death in his father; Hypertension in his father; Kidney disease in his father; Stroke in his father and mother. There is no history of Cancer, Alcohol abuse, Diabetes, Heart disease, or Hyperlipidemia.  ROS:   Please see the history of present illness.    Review of Systems  Musculoskeletal: Positive for joint pain and myalgias.  Gastrointestinal: Positive for diarrhea.  Neurological: Positive for dizziness.   All other systems reviewed and are negative.   EKGs/Labs/Other Test Reviewed:    EKG:  EKG is not ordered today.    Recent Labs: 07/08/2017: TSH 1.22 09/14/2017: Pro B Natriuretic peptide (BNP) 992.0 03/29/2018: BUN 21; Creatinine, Ser 0.93; Hemoglobin 15.2; Platelets 225.0; Potassium 4.3; Sodium 138 04/07/2018: ALT 14   Recent Lipid Panel Lab Results  Component Value Date/Time   CHOL 270 (H) 04/07/2018 02:50 PM   TRIG 145 04/07/2018 02:50 PM   HDL 44 04/07/2018 02:50 PM   CHOLHDL 6.1 (H) 04/07/2018 02:50 PM   CHOLHDL 4 07/08/2017 12:12 PM   LDLCALC 197 (H) 04/07/2018 02:50 PM    Physical Exam:    VS:  BP 108/74   Pulse 73   Ht 5' 6"   (1.676 m)   Wt 171 lb 1.9 oz (77.6 kg)   BMI 27.62 kg/m     Wt Readings from Last 3 Encounters:  06/23/18 171 lb 1.9 oz (77.6 kg)  04/07/18 172 lb 1.9 oz (78.1 kg)  03/29/18 171 lb 4 oz (77.7 kg)     Physical Exam  Constitutional: He is oriented to person, place, and time. He appears well-developed and well-nourished. No distress.  HENT:  Head: Normocephalic and atraumatic.  Eyes: No scleral icterus.  Neck: No JVD present. No thyromegaly present.  Cardiovascular: Normal rate and regular rhythm.  Murmur heard.  Low-pitched early systolic murmur is present at the upper right sternal border and upper left sternal border. Pulmonary/Chest: Effort normal. He has no wheezes. He has no rales.  Abdominal: Soft. He exhibits no distension.  Musculoskeletal: He exhibits no edema.  Lymphadenopathy:    He has no cervical adenopathy.  Neurological: He is alert and oriented to person, place, and time.  Skin: Skin is warm and dry.  Psychiatric: He has a normal mood and affect.    ASSESSMENT & PLAN:    Chronic systolic heart failure (HCC)  EF 25-30 by echocardiogram March 2018.  He is NYHA I-II.  Volume status is stable.  I do not believe that his blood pressure would tolerate any further titration in his heart failure medications.  Continue carvedilol 9.375 mg twice daily, Entresto 24/26 mg twice daily.  Arrange limited echo.  If EF <35, refer to EP for consideration of ICD.  Coronary artery disease involving native coronary artery of native heart without angina pectoris Nonobstructive coronary disease by prior cardiac catheterization.  He denies angina.  Continue aspirin.  He is intolerant to statins.  He is currently completing paperwork for Alirocumab.  Essential hypertension The patient's blood pressure is controlled on his current regimen.  Continue current therapy.   Hyperlipidemia LDL goal <130   As noted, he is intolerant of statins.  He is currently completing paperwork for  Alirocumab.   Dispo:  Return in about 6 months (around 12/22/2018) for Routine Follow Up, w/ Dr. Acie Fredrickson, or Richardson Dopp, PA-C.   Medication Adjustments/Labs and Tests Ordered: Current medicines are reviewed at length with the patient today.  Concerns regarding medicines are outlined above.  Tests Ordered: Orders Placed This Encounter  Procedures  . ECHOCARDIOGRAM LIMITED   Medication Changes: No orders of the defined types were placed in this encounter.   Signed, Richardson Dopp, PA-C  06/23/2018 3:11 PM    [facility] [place], [place], [place]  [address] Phone: [phone]; Fax: [phone]

## 2018-07-12 ENCOUNTER — Telehealth: Payer: Self-pay

## 2018-07-12 NOTE — Telephone Encounter (Signed)
**Note De-Identified  Obfuscation** The pt states that he is interested in applying for pt assistance for his Entresto through [facility]. He request that the provider part be completed and signed by Dr Acie Fredrickson prior to mailing it to him as he wants to mail the application in himself.

## 2018-07-12 NOTE — Telephone Encounter (Signed)
**Note De-identified  Obfuscation** -----  **Note De-Identified  Obfuscation** Message from Leeroy Bock, [facility] sent at 07/12/2018  4:12 PM EDT ----- Regarding: Delene Loll patient assistance Do you know of any patient assistance for patients for Entresto who are in the donut hole?  Thank you! Jinny Blossom

## 2018-07-13 NOTE — Telephone Encounter (Signed)
Dr Acie Fredrickson has signed the pts application and I have mailed it to the pts address.

## 2018-07-23 ENCOUNTER — Telehealth: Payer: Self-pay | Admitting: Pharmacist

## 2018-07-23 NOTE — Telephone Encounter (Signed)
Low Income Subsidy denial letter faxed to [employer] program.

## 2018-07-28 NOTE — Telephone Encounter (Addendum)
**Note De-Identified [name] Obfuscation** Letter received [name] fax from [name] stating that now that the pt has secured coverage for Entresto through [name] provider, they are ready to begin their treatment.   The letter also states that they have notified the pt of their determination by letter as well.  I contacted the pt and he states that he was approved for pt assistance with the [name]. Available amount $1000.

## 2018-08-04 ENCOUNTER — Other Ambulatory Visit: Payer: Self-pay

## 2018-08-04 ENCOUNTER — Ambulatory Visit (HOSPITAL_COMMUNITY): Payer: Medicare Other | Attending: Cardiology

## 2018-08-04 DIAGNOSIS — I5022 Chronic systolic (congestive) heart failure: Secondary | ICD-10-CM | POA: Diagnosis not present

## 2018-08-05 ENCOUNTER — Telehealth: Payer: Self-pay

## 2018-08-05 ENCOUNTER — Encounter: Payer: Self-pay | Admitting: Physician Assistant

## 2018-08-05 NOTE — Telephone Encounter (Signed)
Notes recorded by Frederik Schmidt, RN on 08/05/2018 at 8:42 AM EDT The patient has been notified of the result and verbalized understanding. All questions (if any) were answered. Frederik Schmidt, RN 08/05/2018 8:42 AM

## 2018-08-05 NOTE — Telephone Encounter (Signed)
-----   Message from Liliane Shi, [place] sent at 08/05/2018  8:31 AM EDT ----- Doristine Devoid new! The echocardiogram demonstrates improved LV function.  The ejection fraction is now normal (EF 60-65).  It was previously severely reduced (EF 25-30). Recommendations:  - Continue current medications and follow up as planned.   - Send copy to PCP Richardson Dopp, PA-C    08/05/2018 8:24 AM

## 2018-08-05 NOTE — Telephone Encounter (Signed)
PASS application approved for patient assistance. Pt is aware to contact them to coordinate delivery.

## 2018-08-18 ENCOUNTER — Other Ambulatory Visit: Payer: Self-pay | Admitting: Dermatology

## 2018-08-18 DIAGNOSIS — L821 Other seborrheic keratosis: Secondary | ICD-10-CM | POA: Diagnosis not present

## 2018-08-18 DIAGNOSIS — L570 Actinic keratosis: Secondary | ICD-10-CM | POA: Diagnosis not present

## 2018-08-18 DIAGNOSIS — D485 Neoplasm of uncertain behavior of skin: Secondary | ICD-10-CM | POA: Diagnosis not present

## 2018-08-18 DIAGNOSIS — I872 Venous insufficiency (chronic) (peripheral): Secondary | ICD-10-CM | POA: Diagnosis not present

## 2018-08-20 ENCOUNTER — Telehealth: Payer: Self-pay | Admitting: Pharmacist

## 2018-08-20 MED ORDER — ROSUVASTATIN CALCIUM 5 MG PO TABS: 5.0000 mg | ORAL_TABLET | Freq: Every day | ORAL | 11 refills | Status: DC

## 2018-08-20 NOTE — Telephone Encounter (Signed)
Pt called clinic to state he does not want to start Praluent after reading about the side effects. He was already approved for patient assistance and received a free shipment of medication but will not start therapy. We had been working on patient assistance since April and baseline LDL is 197. Pt adamantly refuses to take injections despite discussion of risks vs benefits. He is already intolerant to atorvastatin and Vytorin but does become agreeable to trying another statin. Will start rosuvastatin 5mg  daily and advised pt to call clinic with tolerability update. Would plan to recheck lipids in 3 months if pt continues on therapy.

## 2018-08-31 NOTE — Telephone Encounter (Signed)
Pt left message to report tolerating rosuvastatin well for the past 10 days. Will call pt after he has been on therapy for 1 month to assess tolerability and schedule f/u labs.

## 2018-09-29 ENCOUNTER — Encounter: Payer: Self-pay | Admitting: Family Medicine

## 2018-09-29 ENCOUNTER — Ambulatory Visit (INDEPENDENT_AMBULATORY_CARE_PROVIDER_SITE_OTHER): Payer: Medicare Other | Admitting: Family Medicine

## 2018-09-29 VITALS — BP 135/86 | HR 80 | Resp 17 | Ht 66.0 in | Wt 172.4 lb

## 2018-09-29 DIAGNOSIS — H6093 Unspecified otitis externa, bilateral: Secondary | ICD-10-CM

## 2018-09-29 DIAGNOSIS — E785 Hyperlipidemia, unspecified: Secondary | ICD-10-CM | POA: Diagnosis not present

## 2018-09-29 DIAGNOSIS — Z7689 Persons encountering health services in other specified circumstances: Secondary | ICD-10-CM | POA: Diagnosis not present

## 2018-09-29 DIAGNOSIS — I2510 Atherosclerotic heart disease of native coronary artery without angina pectoris: Secondary | ICD-10-CM | POA: Diagnosis not present

## 2018-09-29 DIAGNOSIS — I10 Essential (primary) hypertension: Secondary | ICD-10-CM | POA: Diagnosis not present

## 2018-09-29 MED ORDER — SULFAMETHOXAZOLE-TRIMETHOPRIM 800-160 MG PO TABS: 1.0000 | ORAL_TABLET | Freq: Two times a day (BID) | ORAL | 0 refills | Status: DC

## 2018-09-29 NOTE — Progress Notes (Signed)
Established Patient Office Visit  Subjective:  Patient ID: Christian [name], male    DOB: [birth year]  Age: 77 y.o. MRN: [id number]  CC:  Chief Complaint  Patient presents with  . Establish Care    HPI Christian [name] presents for to establish care today.   Medical problems are as follows: hyperlipidemia LDL goal <130; Essential hypertension; Depression with anxiety; Type 2 diabetes mellitus with complication, without long-term current use of insulin ([place]); Chronic systolic heart failure ([place]); Nonischemic cardiomyopathy ([place]); Coronary artery disease involving native coronary artery of native heart without angina pectoris; Claudication of both lower extremities ([place]); Oropharyngeal dysphagia; GERD with esophagitis; and Allergic rhinitis due to pollen.  He currently denies chest pain, shortness of breath, worrisome cough, swelling, new weakness, or headache.  He complains today of ear pain, bilaterally. This has been ongoing x 2 weeks. He has had ear infections in the past which have been successfully treated with bactrim. Denies fever or associated URI symptoms. Has noticed mild dark drainage. Ears feel clogged causing difficulty hearing.  Past Surgical History:  Procedure Laterality Date  . BACK SURGERY    . HERNIA REPAIR    . RIGHT/LEFT HEART CATH AND CORONARY ANGIOGRAPHY N/A 09/16/2017   Procedure: RIGHT/LEFT HEART CATH AND CORONARY ANGIOGRAPHY;  Surgeon: Martinique, Peter M, MD;  Location: [facility];  Service: Cardiovascular;  Laterality: N/A;  . SPINE SURGERY      Family History  Problem Relation Age of Onset  . Stroke Mother   . Kidney disease Father   . Stroke Father   . Hypertension Father   . Early death Father   . Cancer Neg Hx   . Alcohol abuse Neg Hx   . Diabetes Neg Hx   . Heart disease Neg Hx   . Hyperlipidemia Neg Hx     Social History   Socioeconomic History  . Marital status: Single    Spouse name: Not on file  . Number of children: Not on file  .  Years of education: Not on file  . Highest education level: Not on file  Occupational History  . Not on file  Social Needs  . Financial resource strain: Not on file  . Food insecurity:    Worry: Not on file    Inability: Not on file  . Transportation needs:    Medical: Not on file    Non-medical: Not on file  Tobacco Use  . Smoking status: Never Smoker  . Smokeless tobacco: Never Used  Substance and Sexual Activity  . Alcohol use: No  . Drug use: No  . Sexual activity: Not Currently  Lifestyle  . Physical activity:    Days per week: Not on file    Minutes per session: Not on file  . Stress: Not on file  Relationships  . Social connections:    Talks on phone: Not on file    Gets together: Not on file    Attends religious service: Not on file    Active member of club or organization: Not on file    Attends meetings of clubs or organizations: Not on file    Relationship status: Not on file  . Intimate partner violence:    Fear of current or ex partner: Not on file    Emotionally abused: Not on file    Physically abused: Not on file    Forced sexual activity: Not on file  Other Topics Concern  . Not on file  Social History Narrative  .  Not on file    Outpatient Medications Prior to Visit  Medication Sig Dispense Refill  . aspirin EC 81 MG tablet Take 1 tablet (81 mg total) by mouth daily. 90 tablet 3  . carvedilol (COREG) 6.25 MG tablet Take 1.5 tablets (9.375 mg total) by mouth 2 (two) times daily. 270 tablet 3  . cholecalciferol (VITAMIN D) 1000 units tablet Take 1,000 Units by mouth daily.    Marland Kitchen ENTRESTO 24-26 MG TAKE 1 TABLET BY MOUTH TWICE DAILY 180 tablet 3  . esomeprazole (NEXIUM) 40 MG capsule Take 40 mg by mouth as needed (for heartburn).    . furosemide (LASIX) 40 MG tablet Take half (1/2) tablet (20 mg) by mouth three (3) times per week. (Mondays, Wednesdays, and Fridays)    . olopatadine (PATANOL) 0.1 % ophthalmic solution Place 1 drop into both eyes as  needed for allergies.     . rosuvastatin (CRESTOR) 5 MG tablet Take 1 tablet (5 mg total) by mouth daily. 30 tablet 11  . mometasone (NASONEX) 50 MCG/ACT nasal spray Place 2 sprays into the nose daily. 17 g 12   No facility-administered medications prior to visit.     Allergies  Allergen Reactions  . Amoxicillin Hives  . Bactrim [Sulfamethoxazole-Trimethoprim] Other (See Comments)    Dried out his eyes  . Cephalexin Itching  . Hydrocodone Itching  . Lexapro [Escitalopram Oxalate] Other (See Comments)    disorientation  . Lipitor [Atorvastatin] Other (See Comments)    Damage to calf muscles  . Oxycodone Itching  . Statins Other (See Comments)    Muscle pain   . Vytorin [Ezetimibe-Simvastatin] Other (See Comments)    Damage to calf muscles    ROS Review of Systems Pertinent negatives listed in HPI   Objective:    Physical Exam  BP 135/86   Pulse 80   Resp 17   Ht 5\' 6"  (1.676 m)   Wt 172 lb 6.4 oz (78.2 kg)   SpO2 95%   BMI 27.83 kg/m  Wt Readings from Last 3 Encounters:  09/29/18 172 lb 6.4 oz (78.2 kg)  06/23/18 171 lb 1.9 oz (77.6 kg)  04/07/18 172 lb 1.9 oz (78.1 kg)    Physical Exam: Constitutional: Patient appears well-developed and well-nourished. No distress. HENT: Normocephalic, atraumatic. Erythematous and edematous bilateral ear canals, tenderness noted during exam, otic discharge present. TM erythematous, membrane intact. Oropharynx is clear and moist.  Eyes: Conjunctivae and EOM are normal. PERRLA, no scleral icterus. Neck: Normal ROM. Neck supple. No JVD. No tracheal deviation. No thyromegaly. CVS: RRR, S1/S2 +, no murmurs, no gallops, no carotid bruit.  Pulmonary: Effort and breath sounds normal, no stridor, rhonchi, wheezes, rales.  Abdominal: Soft. BS +, no distension, tenderness, rebound or guarding.  Musculoskeletal: Normal range of motion. No edema and no tenderness.  Neuro: Alert. Normal reflexes, muscle tone coordination. No cranial nerve  deficit. Skin: Skin is warm and dry. No rash noted. Not diaphoretic. No erythema. No pallor. Psychiatric: Normal mood and affect. Behavior, judgment, thought content normal.  Health Maintenance Due  Topic Date Due  . OPHTHALMOLOGY EXAM  06/12/1951  . INFLUENZA VACCINE  05/20/2018  . HEMOGLOBIN A1C  05/30/2018  . FOOT EXAM  07/08/2018     Lab Results  Component Value Date   TSH 1.22 07/08/2017   Lab Results  Component Value Date   WBC 5.7 03/29/2018   HGB 15.2 03/29/2018   HCT 45.2 03/29/2018   MCV 88.1 03/29/2018   PLT 225.0 03/29/2018  Lab Results  Component Value Date   NA 138 03/29/2018   K 4.3 03/29/2018   CO2 27 03/29/2018   GLUCOSE 111 (H) 03/29/2018   BUN 21 03/29/2018   CREATININE 0.93 03/29/2018   BILITOT 1.1 04/07/2018   ALKPHOS 46 04/07/2018   AST 18 04/07/2018   ALT 14 04/07/2018   PROT 6.7 04/07/2018   ALBUMIN 4.5 04/07/2018   CALCIUM 9.8 03/29/2018   ANIONGAP 8 09/17/2017   GFR 83.56 03/29/2018   Lab Results  Component Value Date   CHOL 270 (H) 04/07/2018   Lab Results  Component Value Date   HDL 44 04/07/2018   Lab Results  Component Value Date   LDLCALC 197 (H) 04/07/2018   Lab Results  Component Value Date   TRIG 145 04/07/2018   Lab Results  Component Value Date   CHOLHDL 6.1 (H) 04/07/2018   Lab Results  Component Value Date   HGBA1C 6.3 11/30/2017      Assessment & Plan:   Problem List Items Addressed This Visit      Cardiovascular and Mediastinum   Coronary artery disease involving native coronary artery of native heart without angina pectoris   Essential hypertension (Chronic)     Other   Hyperlipidemia LDL goal <130 (Chronic)    Other Visit Diagnoses    Encounter to establish care    -  Primary   Otitis externa of both ears, unspecified chronicity, unspecified type   , antibiotic prescribed       Meds ordered this encounter  Medications  . sulfamethoxazole-trimethoprim (BACTRIM DS,SEPTRA DS) 800-160 MG  tablet    Sig: Take 1 tablet by mouth 2 (two) times daily.    Dispense:  10 tablet    Refill:  0    Follow-up: Schedule follow-up visit to complete Medicare wellness exam within the next 4 to 6 weeks.  Molli Barrows, FNP

## 2018-09-29 NOTE — Patient Instructions (Addendum)
Thank you for choosing Primary Care at [facility] to be your medical home!    Christian [name] was seen by Molli Barrows, FNP today.   [name] primary care provider is Scot Jun, FNP.   For the best care possible, you should try to see Molli Barrows, FNP-C whenever you come to the clinic.   We look forward to seeing you again soon!  If you have any questions about your visit today, please call us at [phone] or feel free to reach your primary care provider via [place].       Otitis Externa Otitis externa is an infection of the outer ear canal. The outer ear canal is the area between the outside of the ear and the eardrum. Otitis externa is sometimes called "swimmer's ear." Follow these instructions at home:  If you were given antibiotic ear drops, use them as told by your doctor. Do not stop using them even if your condition gets better.  Take over-the-counter and prescription medicines only as told by your doctor.  Keep all follow-up visits as told by your doctor. This is important. How is this prevented?  Keep your ear dry. Use the corner of a towel to dry your ear after you swim or bathe.  Try not to scratch or put things in your ear. Doing these things makes it easier for germs to grow in your ear.  Avoid swimming in lakes, dirty water, or pools that may not have the right amount of a chemical called chlorine.  Consider making ear drops and putting 3 or 4 drops in each ear after you swim. Ask your doctor about how you can make ear drops. Contact a doctor if:  You have a fever.  After 3 days your ear is still red, swollen, or painful.  After 3 days you still have pus coming from your ear.  Your redness, swelling, or pain gets worse.  You have a really bad headache.  You have redness, swelling, pain, or tenderness behind your ear. This information is not intended to replace advice given to you by your health care provider. Make sure you discuss  any questions you have with your health care provider. Document Released: 03/24/2008 Document Revised: 11/01/2015 Document Reviewed: 07/16/2015 Elsevier Interactive Patient Education  [employer].

## 2018-10-05 NOTE — Progress Notes (Signed)
Chief Complaint  Patient presents with  . AWV     Subjective:   Ilay Capshaw is a 77 y.o. male who presents for Medicare Annual/Subsequent preventive examination.  Review of Systems:  Constitutional: Negative for fever, chills, diaphoresis, activity change, appetite change and fatigue. HENT: Negative for ear pain, nosebleeds, congestion, facial swelling, rhinorrhea, neck pain, neck stiffness and ear discharge. Impaired hearing bilaterally Eyes: Negative for pain, discharge, redness, itching and visual disturbance. Respiratory: Negative for cough, choking, chest tightness, shortness of breath, wheezing and stridor.  Cardiovascular: Negative for chest pain, palpitations and leg swelling. Gastrointestinal: Negative for abdominal distention. Genitourinary: Negative for dysuria, urgency, frequency, hematuria, flank pain, decreased urine volume, difficulty urinating and dyspareunia.  Musculoskeletal: Negative for back pain, joint swelling, arthralgia and gait problem. Neurological: Negative for dizziness, tremors, seizures, syncope, facial asymmetry, speech difficulty, weakness, light-headedness, numbness and headaches.  Hematological: Negative for adenopathy. Does not bruise/bleed easily. Psychiatric/Behavioral: Negative for hallucinations, behavioral problems, confusion, dysphoric mood, decreased concentration and agitation.       Objective:    Vitals: BP 118/73   Pulse 69   Resp 17   Ht _0  (1.651 m)   Wt 172 lb 12.8 oz (78.4 kg)   SpO2 95%   BMI 28.76 kg/m   Body mass index is 28.76 kg/m.  Advanced Directives 09/15/2017 09/14/2017 12/16/2014  Does Patient Have a Medical Advance Directive? No No No  Would patient like information on creating a medical advance directive? No - Patient declined - -    Tobacco Social History   Tobacco Use  Smoking Status Never Smoker  Smokeless Tobacco Never Used     Counseling given: Patient doesn't use tobacco   Clinical Intake: Timed  Get up and Go screen-passed 10 seconds  No gait instability noted.  Past Medical History:  Diagnosis Date  . Allergic rhinitis due to pollen 03/29/2018  . Chronic systolic heart failure (HCC) - EF improved to normal on Rx    Echo 11/18: Mild LVH, EF 20, diffuse HK mild AI, a sending aorta 42 mm (mildly dilated), mild MR, mild TR, trivial PI, PASP 44 // Echo 3/19: mild LVH, EF 25-30, diff HK, Gr 1 DD, asc aorta 43 mm (mildly dilated), MAC, mild reduced RVSF // Limited echo 10/19: Mild concentric LVH, EF 96-29, grade 1 diastolic dysfunction, MAC, mild LAE   . Coronary artery disease involving native coronary artery of native heart without angina pectoris 11/11/2017   R/L HC 11/18: pLAD 45, oD1 65 ; elevated PCWP (mean 29), moderate pulmonary hypertension, CO 3.66, CI 2.07  . Depression with anxiety 07/08/2017  . Essential hypertension 07/08/2017  . GERD with esophagitis 01/11/2018  . Hernia of abdominal cavity   . Hyperlipidemia   . Hyperlipidemia LDL goal <130 05/25/2017   The 10-year ASCVD risk score Mikey Bussing DC Jr., et al., 2013) is: 31.1%   Values used to calculate the score:     Age: 35 years     Sex: Male     Is Non-Hispanic African American: No     Diabetic: No     Tobacco smoker: No     Systolic Blood Pressure: 528 mmHg     Is BP treated: No     HDL Cholesterol: 68.7 mg/dL     Total Cholesterol: 258 mg/dL   . Idiopathic inflammatory myopathy 05/25/2017  . Neuropathy involving both lower extremities 05/25/2017  . Nonischemic cardiomyopathy ([place]) 11/11/2017   LHC 09/16/17-nonobstructive CAD  . Oropharyngeal dysphagia 01/11/2018  . Type  2 diabetes mellitus with complication, without long-term current use of insulin ([place]) 07/08/2017   Past Surgical History:  Procedure Laterality Date  . BACK SURGERY    . HERNIA REPAIR    . RIGHT/LEFT HEART CATH AND CORONARY ANGIOGRAPHY N/A 09/16/2017   Procedure: RIGHT/LEFT HEART CATH AND CORONARY ANGIOGRAPHY;  Surgeon: Martinique, Peter M, MD;  Location: [facility];  Service: Cardiovascular;  Laterality: N/A;  . SPINE SURGERY     Family History  Problem Relation Age of Onset  . Stroke and smoked Mother 29 deceased   . Kidney disease Father 78 deceased   . Stroke Father   . Hypertension Father   . Early death Father   . Cancer Neg Hx   . Alcohol abuse Neg Hx   . Diabetes Neg Hx   . Heart disease Neg Hx   . Hyperlipidemia Neg Hx    Social History   Socioeconomic History  . Marital status: Single    Spouse name: Not on file  . Number of children: Not on file  . Years of education: Not on file  . Highest education level: Not on file  Occupational History  . Not on file  Social Needs  . Financial resource strain: Not on file  . Food insecurity:    Worry: Not on file    Inability: Not on file  . Transportation needs:    Medical: Not on file    Non-medical: Not on file  Tobacco Use  . Smoking status: Never Smoker  . Smokeless tobacco: Never Used  Substance and Sexual Activity  . Alcohol use: No  . Drug use: No  . Sexual activity: Not Currently  Lifestyle  . Physical activity: very active at [employer]     Days per week: Not on file    Minutes per session: Not on file  . Stress: Not on file  Relationships  . Social connections:     Talks on phone: Not on file    Gets together: Not on file    Attends religious service: Not on file    Active member of club or organization: Not on file    Attends meetings of clubs or organizations: Not on file    Relationship status: Not on file  Other Topics Concern  . Not on file  Social History Narrative  . Not on file    Outpatient Encounter Medications as of 10/06/2018  Medication Sig  . aspirin EC 81 MG tablet Take 1 tablet (81 mg total) by mouth daily.  . carvedilol (COREG) 6.25 MG tablet Take 1.5 tablets (9.375 mg total) by mouth 2 (two) times daily.  . cholecalciferol (VITAMIN D) 1000 units tablet Take 1,000 Units by mouth daily.  Marland Kitchen ENTRESTO 24-26 MG TAKE 1 TABLET BY MOUTH TWICE  DAILY  . esomeprazole (NEXIUM) 40 MG capsule Take 40 mg by mouth as needed (for heartburn).  . furosemide (LASIX) 40 MG tablet Take half (1/2) tablet (20 mg) by mouth three (3) times per week. (Mondays, Wednesdays, and Fridays)  . olopatadine (PATANOL) 0.1 % ophthalmic solution Place 1 drop into both eyes as needed for allergies.   . rosuvastatin (CRESTOR) 5 MG tablet Take 1 tablet (5 mg total) by mouth daily.  Marland Kitchen sulfamethoxazole-trimethoprim (BACTRIM DS,SEPTRA DS) 800-160 MG tablet Take 1 tablet by mouth 2 (two) times daily.   No facility-administered encounter medications on file as of 10/06/2018.     Activities of Daily Living Completely independent of all ADL Continues work  at his [employer] daily for a few hours per week. Currently a licensed driver and operates a Teacher, music.  Patient Care Team: Scot Jun, FNP as PCP - General (Family Medicine) Nahser, [name], MD as PCP - Cardiology (Cardiology)   Dermatology he is follows and had Assessment:   This is a routine wellness examination for [facility].  Exercise Activities and Dietary recommendations   Works at [employer] everyday. On average 16 animals per week  Goals   None     Fall Risk Fall Risk  07/08/2017 07/08/2017  Falls in the past year? No No   Is the patient's home free of loose throw rugs in walkways, pet beds, electrical cords, etc?   no      Grab bars in the bathroom? no      Handrails on the stairs?   n/a      Adequate lighting?   yes   Recent x 1 week per ago. Walking stepped on end of sidewalk, landed leaves. No other falls   Timed Get Up and Go Performed:   Depression Screen PHQ 2/9 Scores 10/01/2018 03/29/2018 07/08/2017  PHQ - 2 Score 0 1 4  PHQ- 9 Score 0 3 7    Cognitive Function Mini-mental status exam Score 27/30, no cognitive impairment noted.   Immunization History  Administered Date(s) Administered  . Influenza, High Dose Seasonal PF 07/08/2017  . Influenza,inj,Quad PF,6+ Mos  08/04/2018  . Pneumococcal Conjugate-13 08/25/2014  . Pneumococcal Polysaccharide-23 09/04/2015  . Tdap 08/25/2014    Qualifies for Shingles Vaccine?  No recent shingles vaccines   Screening Tests Health Maintenance  Topic Date Due  . OPHTHALMOLOGY EXAM  06/12/1951  . HEMOGLOBIN A1C  05/30/2018  . FOOT EXAM  07/08/2018  . URINE MICROALBUMIN  01/07/2019  . TETANUS/TDAP  08/25/2024  . INFLUENZA VACCINE  Completed  . PNA vac Low Risk Adult  Completed   Cancer Screenings: Lung: Low Dose CT Chest recommended if Age 74-80 years, 30 pack-year currently smoking OR have quit w/in 15years. Patient does not qualify. Colorectal: History of a normal colonoscopy.   Additional Screenings: Lipid panel overdue.       Plan:  1. Encounter for Medicare annual wellness exam 2. Hyperlipidemia, unspecified hyperlipidemia type - Lipid panel - CBC  3. Type 2 diabetes mellitus with other circulatory complication, without long-term current use of insulin ([place]), last A1C 6.3. patient currently not prescribed medication as he has remained in the prediabetes stage for sometime. Will repeat  Hemoglobin A1C today and evaluate urine for the presence of protein.  - Comprehensive metabolic panel - POCT URINALYSIS DIP ([facility])  4. Bilateral hearing loss, unspecified hearing loss type Mild cerumen impaction noted. Will schedule ear irrigation. Patient previously referred to audiologist, hearing aids recommended, however due to cost, unable to obtain hearing aid devices.   -I have personally reviewed and noted the following in the patient's chart:   . Medical and social history . Use of alcohol, tobacco or illicit drugs  . Current medications and supplements . Functional ability and status . Nutritional status . Physical activity . Advanced directives . List of other physicians . Hospitalizations, surgeries, and ER visits in previous 12 months . Vitals . Screenings to include cognitive, depression,  and falls . Referrals and appointments  In addition, I have reviewed and discussed with patient certain preventive protocols, quality metrics, and best practice recommendations. A written personalized care plan for preventive services as well as general preventive health recommendations  were provided to patient.     Molli Barrows, FNP  10/05/2018

## 2018-10-06 ENCOUNTER — Encounter: Payer: Self-pay | Admitting: Family Medicine

## 2018-10-06 ENCOUNTER — Ambulatory Visit (INDEPENDENT_AMBULATORY_CARE_PROVIDER_SITE_OTHER): Payer: Medicare Other | Admitting: Family Medicine

## 2018-10-06 VITALS — BP 118/73 | HR 69 | Resp 17 | Ht 65.0 in | Wt 172.8 lb

## 2018-10-06 DIAGNOSIS — E785 Hyperlipidemia, unspecified: Secondary | ICD-10-CM

## 2018-10-06 DIAGNOSIS — E1159 Type 2 diabetes mellitus with other circulatory complications: Secondary | ICD-10-CM | POA: Diagnosis not present

## 2018-10-06 DIAGNOSIS — Z0001 Encounter for general adult medical examination with abnormal findings: Secondary | ICD-10-CM

## 2018-10-06 DIAGNOSIS — H9193 Unspecified hearing loss, bilateral: Secondary | ICD-10-CM | POA: Diagnosis not present

## 2018-10-06 DIAGNOSIS — Z0000 Encounter for general adult medical examination without abnormal findings: Secondary | ICD-10-CM

## 2018-10-06 LAB — POCT URINALYSIS DIP (CLINITEK)
Bilirubin, UA: NEGATIVE
Glucose, UA: NEGATIVE mg/dL
Ketones, POC UA: NEGATIVE mg/dL
Leukocytes, UA: NEGATIVE
Nitrite, UA: NEGATIVE
POC PROTEIN,UA: NEGATIVE
Spec Grav, UA: >=1.03 — AB (ref 1.010–1.025)
UROBILINOGEN UA: 0.2 U/dL
pH, UA: 5.5 (ref 5.0–8.0)

## 2018-10-06 NOTE — Patient Instructions (Signed)
   Christian [name] , Thank you for taking time to come for your Medicare Wellness Visit. I appreciate your ongoing commitment to your health goals. Please review the following plan we discussed and let me know if I can assist you in the future.   These are the goals we discussed: Goals   Increase physical activity Continue medications as prescribed. Increase intake of fruits and vegetables    This is a list of the screening recommended for you and due dates:  Health Maintenance  Topic Date Due  . Eye exam for diabetics  06/12/1951  . Hemoglobin A1C  05/30/2018  . Complete foot exam   07/08/2018  . Urine Protein Check  01/07/2019  . Tetanus Vaccine  08/25/2024  . Flu Shot  Completed  . Pneumonia vaccines  Completed

## 2018-10-07 LAB — COMPREHENSIVE METABOLIC PANEL
ALT: 18 IU/L (ref 0–44)
AST: 17 IU/L (ref 0–40)
Albumin/Globulin Ratio: 2.1 (ref 1.2–2.2)
Albumin: 4.4 g/dL (ref 3.5–4.8)
Alkaline Phosphatase: 45 IU/L (ref 39–117)
BUN/Creatinine Ratio: 18 (ref 10–24)
BUN: 21 mg/dL (ref 8–27)
Bilirubin Total: 0.7 mg/dL (ref 0.0–1.2)
CO2: 20 mmol/L (ref 20–29)
Calcium: 9 mg/dL (ref 8.6–10.2)
Chloride: 100 mmol/L (ref 96–106)
Creatinine, Ser: 1.15 mg/dL (ref 0.76–1.27)
GFR calc Af Amer: 70 mL/min/{1.73_m2} (ref >59)
GFR calc non Af Amer: 61 mL/min/{1.73_m2} (ref >59)
GLUCOSE: 107 mg/dL — AB (ref 65–99)
Globulin, Total: 2.1 g/dL (ref 1.5–4.5)
Potassium: 4.9 mmol/L (ref 3.5–5.2)
Sodium: 134 mmol/L (ref 134–144)
Total Protein: 6.5 g/dL (ref 6.0–8.5)

## 2018-10-07 LAB — CBC
Hematocrit: 43 % (ref 37.5–51.0)
Hemoglobin: 14.9 g/dL (ref 13.0–17.7)
MCH: 30.3 pg (ref 26.6–33.0)
MCHC: 34.7 g/dL (ref 31.5–35.7)
MCV: 87 fL (ref 79–97)
Platelets: 214 10*3/uL (ref 150–450)
RBC: 4.92 x10E6/uL (ref 4.14–5.80)
RDW: 13.3 % (ref 12.3–15.4)
WBC: 6.2 10*3/uL (ref 3.4–10.8)

## 2018-10-07 LAB — HEMOGLOBIN A1C
Est. average glucose Bld gHb Est-mCnc: 134 mg/dL
Hgb A1c MFr Bld: 6.3 % — ABNORMAL HIGH (ref 4.8–5.6)

## 2018-10-07 LAB — LIPID PANEL
Chol/HDL Ratio: 3.5 ratio (ref 0.0–5.0)
Cholesterol, Total: 177 mg/dL (ref 100–199)
HDL: 50 mg/dL (ref >39)
LDL Calculated: 104 mg/dL — ABNORMAL HIGH (ref 0–99)
TRIGLYCERIDES: 116 mg/dL (ref 0–149)
VLDL Cholesterol Cal: 23 mg/dL (ref 5–40)

## 2018-10-14 ENCOUNTER — Encounter: Payer: Self-pay | Admitting: Family Medicine

## 2018-10-14 ENCOUNTER — Ambulatory Visit (INDEPENDENT_AMBULATORY_CARE_PROVIDER_SITE_OTHER): Payer: Medicare Other | Admitting: Family Medicine

## 2018-10-14 VITALS — BP 124/79 | HR 81 | Temp 97.5°F | Resp 16 | Ht 65.0 in | Wt 175.6 lb

## 2018-10-14 DIAGNOSIS — H6121 Impacted cerumen, right ear: Secondary | ICD-10-CM | POA: Diagnosis not present

## 2018-10-14 NOTE — Patient Instructions (Signed)
Ear Irrigation  What is ear irrigation?  Ear irrigation is a procedure to wash dirt and wax out of your ear canal. This procedure is also called lavage. You may need ear irrigation if you are having trouble hearing because of a buildup of earwax. You may also have ear irrigation as part of the treatment for an ear infection. Getting wax and dirt out of your ear canal can help some medicines given as ear drops work better.  How is ear irrigation performed?  The procedure may vary among health care providers and hospitals. You may be given ear drops to put in your ear 15-20 minutes before irrigation. This helps loosen the wax. Then, a syringe containing water and a sterile salt solution (saline) can be gently inserted into the ear canal. The saline is used to flush out wax and other debris.  Ear irrigation kits are also available to use at home. Ask your health care provider if this is an option for you. Use a home irrigation kit only as told by your health care provider. Read the package instructions carefully. Follow the directions for using the syringe. Use water that is room temperature.  Do not do ear irrigation at home if you:   Have diabetes. Diabetes increases the risk of infection.   Have a hole or tear in your eardrum.   Have tubes in your ears.  What are the risks of ear irrigation?  Generally, this is a safe procedure. However, problems may occur, including:   Infection.   Pain.   Hearing loss.   Pushing water and debris into the eardrum. This can occur if there are holes in the eardrum.   Ear irrigation failing to work.  How should I care for my ears after having them irrigated?  Cleaning     Clean the outside of your ear with a soft washcloth daily.   If told by your health care provider, use a few drops of baby oil, mineral oil, glycerin, hydrogen peroxide, or over-the-counter earwax softening drops.   Do not use cotton swabs to clean your ears. These can push wax down into the ear  canal.   Do not put anything into your ears to try to remove wax. This includes ear candles.  General Instructions   Take over-the-counter and prescription medicines only as told by your health care provider.   If you were prescribed an antibiotic medicine, use it as told by your health care provider. Do not stop using the antibiotic even if your condition improves.   Keep all follow-up visits as told by your health care provider. This is important.   Visit your health care provider at least once a year to have your ears and hearing checked.  When should I seek medical care?  Seek medical care if:   Your hearing is not improving or is getting worse.   You have pain or redness in your ear.   You have fluid, blood, or pus coming out of your ear.  This information is not intended to replace advice given to you by your health care provider. Make sure you discuss any questions you have with your health care provider.  Document Released: 11/02/2015 Document Revised: 05/20/2017 Document Reviewed: 03/15/2015  Elsevier Interactive Patient Education  2019 Elsevier Inc.

## 2018-10-14 NOTE — Progress Notes (Signed)
Established Patient Office Visit  Subjective:  Patient ID: Christian [name], male    DOB: 1941/03/16  Age: 77 y.o. MRN: [id number]  CC:  Chief Complaint  Patient presents with  . Ear Pain    HPI Christian [name] presents for ear wax buildup and ear pain  This is a 77y male who is hard of hearing who presents for follow up for ear wax removal. He was advised to come here by Molli Barrows FNP due to decrease hearing, ear pain and complaint of ear wax He denies fevers or chills  No dizziness, nausea or vomiting   Past Medical History:  Diagnosis Date  . Allergic rhinitis due to pollen 03/29/2018  . Chronic systolic heart failure (HCC) - EF improved to normal on Rx    Echo 11/18: Mild LVH, EF 20, diffuse HK mild AI, a sending aorta 42 mm (mildly dilated), mild MR, mild TR, trivial PI, PASP 44 // Echo 3/19: mild LVH, EF 25-30, diff HK, Gr 1 DD, asc aorta 43 mm (mildly dilated), MAC, mild reduced RVSF // Limited echo 10/19: Mild concentric LVH, EF 27-25, grade 1 diastolic dysfunction, MAC, mild LAE   . Coronary artery disease involving native coronary artery of native heart without angina pectoris 11/11/2017   R/L HC 11/18: pLAD 45, oD1 65 ; elevated PCWP (mean 29), moderate pulmonary hypertension, CO 3.66, CI 2.07  . Depression with anxiety 07/08/2017  . Essential hypertension 07/08/2017  . GERD with esophagitis 01/11/2018  . Hernia of abdominal cavity   . Hyperlipidemia   . Hyperlipidemia LDL goal <130 05/25/2017   The 10-year ASCVD risk score Mikey Bussing DC Jr., et al., 2013) is: 31.1%   Values used to calculate the score:     Age: 77 years     Sex: Male     Is Non-Hispanic African American: No     Diabetic: No     Tobacco smoker: No     Systolic Blood Pressure: 366 mmHg     Is BP treated: No     HDL Cholesterol: 68.7 mg/dL     Total Cholesterol: 258 mg/dL   . Idiopathic inflammatory myopathy 05/25/2017  . Neuropathy involving both lower extremities 05/25/2017  . Nonischemic cardiomyopathy ([facility])  11/11/2017   LHC 09/16/17-nonobstructive CAD  . Oropharyngeal dysphagia 01/11/2018  . Type 2 diabetes mellitus with complication, without long-term current use of insulin ([facility]) 07/08/2017    Past Surgical History:  Procedure Laterality Date  . BACK SURGERY    . HERNIA REPAIR    . RIGHT/LEFT HEART CATH AND CORONARY ANGIOGRAPHY N/A 09/16/2017   Procedure: RIGHT/LEFT HEART CATH AND CORONARY ANGIOGRAPHY;  Surgeon: Martinique, Peter M, MD;  Location: [facility];  Service: Cardiovascular;  Laterality: N/A;  . SPINE SURGERY      Family History  Problem Relation Age of Onset  . Stroke Mother   . Kidney disease Father   . Stroke Father   . Hypertension Father   . Early death Father   . Cancer Neg Hx   . Alcohol abuse Neg Hx   . Diabetes Neg Hx   . Heart disease Neg Hx   . Hyperlipidemia Neg Hx     Social History   Socioeconomic History  . Marital status: Single    Spouse name: Not on file  . Number of children: Not on file  . Years of education: Not on file  . Highest education level: Not on file  Occupational History  . Not on file  Social Needs  . Financial resource strain: Not on file  . Food insecurity:    Worry: Not on file    Inability: Not on file  . Transportation needs:    Medical: Not on file    Non-medical: Not on file  Tobacco Use  . Smoking status: Never Smoker  . Smokeless tobacco: Never Used  Substance and Sexual Activity  . Alcohol use: No  . Drug use: No  . Sexual activity: Not Currently  Lifestyle  . Physical activity:    Days per week: Not on file    Minutes per session: Not on file  . Stress: Not on file  Relationships  . Social connections:    Talks on phone: Not on file    Gets together: Not on file    Attends religious service: Not on file    Active member of club or organization: Not on file    Attends meetings of clubs or organizations: Not on file    Relationship status: Not on file  . Intimate partner violence:    Fear of current  or ex partner: Not on file    Emotionally abused: Not on file    Physically abused: Not on file    Forced sexual activity: Not on file  Other Topics Concern  . Not on file  Social History Narrative  . Not on file    Outpatient Medications Prior to Visit  Medication Sig Dispense Refill  . aspirin EC 81 MG tablet Take 1 tablet (81 mg total) by mouth daily. 90 tablet 3  . carvedilol (COREG) 6.25 MG tablet Take 1.5 tablets (9.375 mg total) by mouth 2 (two) times daily. 270 tablet 3  . cholecalciferol (VITAMIN D) 1000 units tablet Take 1,000 Units by mouth daily.    Marland Kitchen ENTRESTO 24-26 MG TAKE 1 TABLET BY MOUTH TWICE DAILY 180 tablet 3  . esomeprazole (NEXIUM) 40 MG capsule Take 40 mg by mouth as needed (for heartburn).    . furosemide (LASIX) 40 MG tablet Take half (1/2) tablet (20 mg) by mouth three (3) times per week. (Mondays, Wednesdays, and Fridays)    . olopatadine (PATANOL) 0.1 % ophthalmic solution Place 1 drop into both eyes as needed for allergies.     . rosuvastatin (CRESTOR) 5 MG tablet Take 1 tablet (5 mg total) by mouth daily. 30 tablet 11  . sulfamethoxazole-trimethoprim (BACTRIM DS,SEPTRA DS) 800-160 MG tablet Take 1 tablet by mouth 2 (two) times daily. 10 tablet 0   No facility-administered medications prior to visit.     Allergies  Allergen Reactions  . Amoxicillin Hives  . Bactrim [Sulfamethoxazole-Trimethoprim] Other (See Comments)    Dried out his eyes  . Cephalexin Itching  . Hydrocodone Itching  . Lexapro [Escitalopram Oxalate] Other (See Comments)    disorientation  . Lipitor [Atorvastatin] Other (See Comments)    Damage to calf muscles  . Oxycodone Itching  . Statins Other (See Comments)    Muscle pain   . Vytorin [Ezetimibe-Simvastatin] Other (See Comments)    Damage to calf muscles    ROS Review of Systems See hpi   Objective:    Physical Exam  BP 124/79 (BP Location: Left Arm, Patient Position: Sitting, Cuff Size: Normal)   Pulse 81   Temp  (!) 97.5 F (36.4 C) (Oral)   Resp 16   Ht _0  (1.651 m)   Wt 175 lb 9.6 oz (79.7 kg)   SpO2 97%   BMI 29.22 kg/m  Wt  Readings from Last 3 Encounters:  10/14/18 175 lb 9.6 oz (79.7 kg)  10/06/18 172 lb 12.8 oz (78.4 kg)  09/29/18 172 lb 6.4 oz (78.2 kg)   General: alert, oriented, in NAD Head: normocephalic, atraumatic, no sinus tenderness Eyes: EOM intact, no scleral icterus or conjunctival injection Ears: TM clear on left, right ear impacted with cerumen, normal external canals Nose: mucosa nonerythematous, nonedematous Throat: no pharyngeal exudate or erythema Lungs: normal effort Neuro: normal gait, normal tone and level of consciousness    Health Maintenance Due  Topic Date Due  . OPHTHALMOLOGY EXAM  06/12/1951  . FOOT EXAM  07/08/2018    There are no preventive care reminders to display for this patient.  Lab Results  Component Value Date   TSH 1.22 07/08/2017   Lab Results  Component Value Date   WBC 6.2 10/06/2018   HGB 14.9 10/06/2018   HCT 43.0 10/06/2018   MCV 87 10/06/2018   PLT 214 10/06/2018   Lab Results  Component Value Date   NA 134 10/06/2018   K 4.9 10/06/2018   CO2 20 10/06/2018   GLUCOSE 107 (H) 10/06/2018   BUN 21 10/06/2018   CREATININE 1.15 10/06/2018   BILITOT 0.7 10/06/2018   ALKPHOS 45 10/06/2018   AST 17 10/06/2018   ALT 18 10/06/2018   PROT 6.5 10/06/2018   ALBUMIN 4.4 10/06/2018   CALCIUM 9.0 10/06/2018   ANIONGAP 8 09/17/2017   GFR 83.56 03/29/2018   Lab Results  Component Value Date   CHOL 177 10/06/2018   Lab Results  Component Value Date   HDL 50 10/06/2018   Lab Results  Component Value Date   LDLCALC 104 (H) 10/06/2018   Lab Results  Component Value Date   TRIG 116 10/06/2018   Lab Results  Component Value Date   CHOLHDL 3.5 10/06/2018   Lab Results  Component Value Date   HGBA1C 6.3 (H) 10/06/2018      Assessment & Plan:   Problem List Items Addressed This Visit    None    Visit  Diagnoses    Right ear impacted cerumen    -  Primary   Relevant Orders   Ear wax removal     Successfully removed wax with lavage Pt tolerated procedure well and was discharge to home  No orders of the defined types were placed in this encounter.   Follow-up: No follow-ups on file.    Forrest Moron, MD

## 2018-10-15 NOTE — Progress Notes (Signed)
Patient notified of results & recommendations. Expressed understanding. Per patient's request a lab letter was printed & placed up front for patient to pick up on Monday.

## 2018-10-18 ENCOUNTER — Other Ambulatory Visit: Payer: Self-pay | Admitting: Nurse Practitioner

## 2018-10-18 NOTE — Telephone Encounter (Signed)
Take Monday, Wed, Friday

## 2018-11-18 NOTE — Addendum Note (Signed)
Addended by: Scot Jun on: 11/18/2018 08:19 PM   Modules accepted: Level of Service

## 2019-01-06 ENCOUNTER — Telehealth: Payer: Self-pay

## 2019-01-06 NOTE — Telephone Encounter (Signed)

## 2019-01-12 ENCOUNTER — Ambulatory Visit: Payer: Medicare Other | Admitting: Cardiovascular Disease

## 2019-01-14 ENCOUNTER — Telehealth: Payer: Self-pay

## 2019-01-14 NOTE — Telephone Encounter (Signed)
Attempted to reach pt to see if they were interested in having a virtual visit with Pecolia Ades, NP on 01/14/2019. LVM

## 2019-01-31 ENCOUNTER — Other Ambulatory Visit: Payer: Self-pay

## 2019-01-31 NOTE — Patient Outreach (Signed)
[facility]) Care Management  01/31/2019  Christian [name] [birth year] [id number]   Medication Adherence call to Mr. [name] spoke with patient he is due on Rosuvastatin 5 mg he does not have time to answer the call today but wants a call back at a later time. Mr. [name] is showing past due under [place].   [facility] Management Direct Dial [phone]  Fax [phone] [email or web address]

## 2019-02-01 ENCOUNTER — Telehealth: Payer: Self-pay | Admitting: Physician Assistant

## 2019-02-01 NOTE — Telephone Encounter (Signed)
Patient was able to sign in to My Chart and received the Virtual Consent and accepted. He will be using a smart phone.

## 2019-02-01 NOTE — Telephone Encounter (Signed)
Spoke with patient who confirmed all demographics. He will be using his friend's smart phone for the visit Verneita Griffes).  He also wants to use his friend's e-mail since he does not have e-mail. He said that when she gets home, if they have trouble signing in to My Chart, that she will call me.

## 2019-02-01 NOTE — Telephone Encounter (Signed)
Virtual Visit Pre-Appointment Phone Call  Steps For Call:  1. Confirm consent - "In the setting of the current Covid19 crisis, you are scheduled for a (phone or video) visit with your provider on (date) at (time).  Just as we do with many in-office visits, in order for you to participate in this visit, we must obtain consent.  If you'd like, I can send this to your mychart (if signed up) or email for you to review.  Otherwise, I can obtain your verbal consent now.  All virtual visits are billed to your insurance company just like a normal visit would be.  By agreeing to a virtual visit, we'd like you to understand that the technology does not allow for your provider to perform an examination, and thus may limit your provider's ability to fully assess your condition.  Finally, though the technology is pretty good, we cannot assure that it will always work on either your or our end, and in the setting of a video visit, we may have to convert it to a phone-only visit.  In either situation, we cannot ensure that we have a secure connection.  Are you willing to proceed?" STAFF: Did the patient verbally acknowledge consent to telehealth visit? yesDocument YES/NOyes  2. Confirm the BEST phone number to call the day of the visit: [phone]  3. Give patient instructions for WebEx/MyChart download to smartphone as below or Doximity/[email or web address] if video visit (depending on what platform provider is using)  4. Advise patient to be prepared with any vital sign or heart rhythm information, their current medicines, and a piece of paper and pen handy for any instructions they may receive the day of their visit  5. Inform patient they will receive a phone call 15 minutes prior to their appointment time (may be from unknown caller ID) so they should be prepared to answer  6. Confirm that appointment type is correct in Epic appointment notes (video vs telephone)     TELEPHONE CALL NOTE  Christian [name] has been  deemed a candidate for a follow-up tele-health visit to limit community exposure during the Covid-19 pandemic. I spoke with the patient via phone to ensure availability of phone/video source, confirm preferred email & phone number, and discuss instructions and expectations.  I reminded Christian [name] to be prepared with any vital sign and/or heart rhythm information that could potentially be obtained via home monitoring, at the time of his visit. I reminded Christian [name] to expect a phone call at the time of his visit if his visit.  Jeanann Lewandowsky, [name] 02/01/2019 2:48 PM   DOWNLOADING THE WEBEX APP TO SMARTPHONE  - If Apple, ask patient to go to App Store and type in WebEx in the search bar. [employer], the blue/green circle. If Android, go to [employer] and type in [employer] in the search bar. The app is free but as with any other app downloads, their phone may require them to verify saved payment information or Apple/Android password.  - The patient does NOT have to create an account. - On the day of the visit, the assist will walk the patient through joining the meeting with the meeting number/password.  DOWNLOADING THE MYCHART APP TO SMARTPHONE  - If Apple, go to [employer] and type in MyChart in the search bar and download the app. If Android, ask patient to go to [employer] and type in [place] in the search bar and download the app. The  app is free but as with any other app downloads, their phone may require them to verify saved payment information or Apple/Android password.  - The patient will need to then log into the app with their MyChart username and password, and select [facility] as their healthcare provider to link the account. When it is time for your visit, go to the MyChart app, find appointments, and click Begin Video Visit. Be sure to Select Allow for your device to access the Microphone and Camera for your visit. You will then be connected, and your  provider will be with you shortly.  **If they have any issues connecting, or need assistance please contact [facility] [phone])**  **If using a computer, in order to ensure the best quality for your visit they will need to use either of the following Internet Browsers: Microsoft [place], or Google Chrome**  [place]   I hereby voluntarily request, consent and authorize [facility] and its employed or contracted physicians, physician assistants, nurse practitioners or other licensed health care professionals (the Practitioner), to provide me with telemedicine health care services (the Services") as deemed necessary by the treating Practitioner. I acknowledge and consent to receive the Services by the Practitioner via telemedicine. I understand that the telemedicine visit will involve communicating with the Practitioner through live audiovisual communication technology and the disclosure of certain medical information by electronic transmission. I acknowledge that I have been given the opportunity to request an in-person assessment or other available alternative prior to the telemedicine visit and am voluntarily participating in the telemedicine visit.  I understand that I have the right to withhold or withdraw my consent to the use of telemedicine in the course of my care at any time, without affecting my right to future care or treatment, and that the Practitioner or I may terminate the telemedicine visit at any time. I understand that I have the right to inspect all information obtained and/or recorded in the course of the telemedicine visit and may receive copies of available information for a reasonable fee.  I understand that some of the potential risks of receiving the Services via telemedicine include:   Delay or interruption in medical evaluation due to technological equipment failure or disruption;  Information transmitted may not be  sufficient (e.g. poor resolution of images) to allow for appropriate medical decision making by the Practitioner; and/or   In rare instances, security protocols could fail, causing a breach of personal health information.  Furthermore, I acknowledge that it is my responsibility to provide information about my medical history, conditions and care that is complete and accurate to the best of my ability. I acknowledge that Practitioner's advice, recommendations, and/or decision may be based on factors not within their control, such as incomplete or inaccurate data provided by me or distortions of diagnostic images or specimens that may result from electronic transmissions. I understand that the practice of medicine is not an exact science and that Practitioner makes no warranties or guarantees regarding treatment outcomes. I acknowledge that I will receive a copy of this consent concurrently upon execution via email to the email address I last provided but may also request a printed copy by calling the office of [facility].    I understand that my insurance will be billed for this visit.   I have read or had this consent read to me.  I understand the contents of this consent, which adequately explains the benefits and risks of  the Services being provided via telemedicine.   I have been provided ample opportunity to ask questions regarding this consent and the Services and have had my questions answered to my satisfaction.  I give my informed consent for the services to be provided through the use of telemedicine in my medical care  By participating in this telemedicine visit I agree to the above.

## 2019-02-03 ENCOUNTER — Telehealth (INDEPENDENT_AMBULATORY_CARE_PROVIDER_SITE_OTHER): Payer: Medicare Other | Admitting: Physician Assistant

## 2019-02-03 ENCOUNTER — Other Ambulatory Visit: Payer: Self-pay

## 2019-02-03 ENCOUNTER — Encounter: Payer: Self-pay | Admitting: Physician Assistant

## 2019-02-03 VITALS — BP 107/67 | HR 90 | Ht 65.0 in | Wt 165.0 lb

## 2019-02-03 DIAGNOSIS — I10 Essential (primary) hypertension: Secondary | ICD-10-CM

## 2019-02-03 DIAGNOSIS — Z7189 Other specified counseling: Secondary | ICD-10-CM

## 2019-02-03 DIAGNOSIS — E785 Hyperlipidemia, unspecified: Secondary | ICD-10-CM

## 2019-02-03 DIAGNOSIS — I428 Other cardiomyopathies: Secondary | ICD-10-CM | POA: Diagnosis not present

## 2019-02-03 DIAGNOSIS — I5022 Chronic systolic (congestive) heart failure: Secondary | ICD-10-CM

## 2019-02-03 NOTE — Patient Instructions (Signed)
Medication Instructions:  none If you need a refill on your cardiac medications before your next appointment, please call your pharmacy.   Lab work: none If you have labs (blood work) drawn today and your tests are completely normal, you will receive your results only by: Marland Kitchen MyChart Message (if you have MyChart) OR . A paper copy in the mail If you have any lab test that is abnormal or we need to change your treatment, we will call you to review the results.  Testing/Procedures: none  Follow-Up: 6 months with Dr Acie Fredrickson At [facility], you and your health needs are our priority.  As part of our continuing mission to provide you with exceptional heart care, we have created designated Provider Care Teams.  These Care Teams include your primary Cardiologist (physician) and Advanced Practice Providers (APPs -  Physician Assistants and Nurse Practitioners) who all work together to provide you with the care you need, when you need it.  Any Other Special Instructions Will Be Listed Below (If Applicable).

## 2019-02-03 NOTE — Progress Notes (Signed)
Virtual Visit via Video Note   This visit type was conducted due to national recommendations for restrictions regarding the COVID-19 Pandemic (e.g. social distancing) in an effort to limit this patient's exposure and mitigate transmission in our community.  Due to his co-morbid illnesses, this patient is at least at moderate risk for complications without adequate follow up.  This format is felt to be most appropriate for this patient at this time.  All issues noted in this document were discussed and addressed.  A limited physical exam was performed with this format.  Please refer to the patient's chart for his consent to telehealth for [facility].   Evaluation Performed:  Follow-up visit  Date:  02/03/2019   ID:  Christian [name], DOB 26-Jul-1941, MRN [id number]  Patient Location: Home Provider Location: Home  PCP:  Scot Jun, FNP  Cardiologist:  Mertie Moores, MD  Chief Complaint:  6 months follow up  History of Present Illness:    Christian [name] is a 78 y.o. male with systolic heart failure secondary to nonischemic cardiomyopathy with improved LVEF on echo 10/19, nonobstructive coronary artery disease,diabetes, hypertension, hyperlipidemia, neuropathy, idiopathic inflammatory myopathy seen for follow up.  He is intolerant of statins.  He has been seen by our lipid clinic. Last seen by Richardson Dopp 06/2018. Last echo 07/2018 showed improved LVEF of 60-65%, grade 1 DD.  The patient does not have symptoms concerning for COVID-19 infection (fever, chills, cough, or new shortness of breath).   Today patient does not have any complaints.  Compliant with medication.  Does yard work and household chores without any limitation. The patient denies nausea, vomiting, fever, chest pain, palpitations, shortness of breath, orthopnea, PND, dizziness, syncope, cough, congestion, abdominal pain, hematochezia, melena, lower extremity edema.   Past Medical History:  Diagnosis Date  . Allergic  rhinitis due to pollen 03/29/2018  . Chronic systolic heart failure (HCC) - EF improved to normal on Rx    Echo 11/18: Mild LVH, EF 20, diffuse HK mild AI, a sending aorta 42 mm (mildly dilated), mild MR, mild TR, trivial PI, PASP 44 // Echo 3/19: mild LVH, EF 25-30, diff HK, Gr 1 DD, asc aorta 43 mm (mildly dilated), MAC, mild reduced RVSF // Limited echo 10/19: Mild concentric LVH, EF 74-94, grade 1 diastolic dysfunction, MAC, mild LAE   . Coronary artery disease involving native coronary artery of native heart without angina pectoris 11/11/2017   R/L HC 11/18: pLAD 45, oD1 65 ; elevated PCWP (mean 29), moderate pulmonary hypertension, CO 3.66, CI 2.07  . Depression with anxiety 07/08/2017  . Essential hypertension 07/08/2017  . GERD with esophagitis 01/11/2018  . Hernia of abdominal cavity   . Hyperlipidemia   . Hyperlipidemia LDL goal <130 05/25/2017   The 10-year ASCVD risk score Mikey Bussing DC Jr., et al., 2013) is: 31.1%   Values used to calculate the score:     Age: 46 years     Sex: Male     Is Non-Hispanic African American: No     Diabetic: No     Tobacco smoker: No     Systolic Blood Pressure: 496 mmHg     Is BP treated: No     HDL Cholesterol: 68.7 mg/dL     Total Cholesterol: 258 mg/dL   . Idiopathic inflammatory myopathy 05/25/2017  . Neuropathy involving both lower extremities 05/25/2017  . Nonischemic cardiomyopathy ([place]) 11/11/2017   LHC 09/16/17-nonobstructive CAD  . Oropharyngeal dysphagia 01/11/2018  . Type 2 diabetes mellitus  with complication, without long-term current use of insulin ([place]) 07/08/2017   Past Surgical History:  Procedure Laterality Date  . BACK SURGERY    . HERNIA REPAIR    . RIGHT/LEFT HEART CATH AND CORONARY ANGIOGRAPHY N/A 09/16/2017   Procedure: RIGHT/LEFT HEART CATH AND CORONARY ANGIOGRAPHY;  Surgeon: Martinique, Peter M, MD;  Location: [place] [facility];  Service: Cardiovascular;  Laterality: N/A;  . SPINE SURGERY       Current Meds  Medication Sig  . aspirin EC 81  MG tablet Take 1 tablet (81 mg total) by mouth daily.  . carvedilol (COREG) 6.25 MG tablet Take 6.25 mg by mouth 2 (two) times daily with a meal.  . cholecalciferol (VITAMIN D) 1000 units tablet Take 1,000 Units by mouth daily.  Marland Kitchen ENTRESTO 24-26 MG TAKE 1 TABLET BY MOUTH TWICE DAILY  . esomeprazole (NEXIUM) 40 MG capsule Take 40 mg by mouth as needed (for heartburn).  . furosemide (LASIX) 40 MG tablet Take 0.5 tablets (20 mg total) by mouth 3 (three) times a week.  Marland Kitchen olopatadine (PATANOL) 0.1 % ophthalmic solution Place 1 drop into both eyes as needed for allergies.   . rosuvastatin (CRESTOR) 5 MG tablet Take 1 tablet (5 mg total) by mouth daily.     Allergies:   Amoxicillin; Bactrim [sulfamethoxazole-trimethoprim]; Cephalexin; Hydrocodone; Lexapro [escitalopram oxalate]; Lipitor [atorvastatin]; Oxycodone; Statins; and Vytorin [ezetimibe-simvastatin]   Social History   Tobacco Use  . Smoking status: Never Smoker  . Smokeless tobacco: Never Used  Substance Use Topics  . Alcohol use: No  . Drug use: No     Family Hx: The patient's family history includes Early death in his father; Hypertension in his father; Kidney disease in his father; Stroke in his father and mother. There is no history of Cancer, Alcohol abuse, Diabetes, Heart disease, or Hyperlipidemia.  ROS:   Please see the history of present illness.    All other systems reviewed and are negative.   Prior CV studies:   The following studies were reviewed today: Echo 07/2018 - Left ventricle: The cavity size was normal. There was mild   concentric hypertrophy. Systolic function was normal. The   estimated ejection fraction was in the range of 60% to 65%. Wall   motion was normal; there were no regional wall motion   abnormalities. Doppler parameters are consistent with abnormal   left ventricular relaxation (grade 1 diastolic dysfunction). - Mitral valve: Calcified annulus. - Left atrium: The atrium was mildly dilated. -  Impressions: This was an incomplete study. The following   information was not imaged:   1) No short axis of the aortic valve   2) No doppler gradients across the aortic valve   3) Pulmonic valve not visualize   4) No color Doppler or tricuspid valve   5) Sho subcostal views of the heart or IVC imaging  Echo 12/18/2017 Mild LVH, EF 25-30, diffuse HK, grade 1 diastolic dysfunction, mildly dilated ascending aorta (43 mm), MAC, mildly reduced RVSF  Right/left cardiac catheterization 09/16/17 LAD proximal 45,ostial D1 65 Mean RA 11, PASP 54, mean PA 39 LVEDP 31, mean wedge 29 1. Nonobstructive CAD 2. Elevated LV filling pressures with PCWP mean of 29 mm Hg 3. Moderate pulmonary HTN 4. Cardiac output 3.66 L/min with index of 2.07.  Echocardiogram 09/15/17 Mild LVH, EF 20, diffuse HK mild AI, a sending aorta 42 mm (mildly dilated), mild MR, mild TR, trivial PI, PASP 44  Labs/Other Tests and Data Reviewed:  EKG:  No ECG reviewed.  Recent Labs: 10/06/2018: ALT 18; BUN 21; Creatinine, Ser 1.15; Hemoglobin 14.9; Platelets 214; Potassium 4.9; Sodium 134   Recent Lipid Panel Lab Results  Component Value Date/Time   CHOL 177 10/06/2018 04:01 PM   TRIG 116 10/06/2018 04:01 PM   HDL 50 10/06/2018 04:01 PM   CHOLHDL 3.5 10/06/2018 04:01 PM   CHOLHDL 4 07/08/2017 12:12 PM   LDLCALC 104 (H) 10/06/2018 04:01 PM    Wt Readings from Last 3 Encounters:  02/03/19 165 lb (74.8 kg)  10/14/18 175 lb 9.6 oz (79.7 kg)  10/06/18 172 lb 12.8 oz (78.4 kg)     Objective:    Vital Signs:  BP 107/67   Pulse 90   Ht _0  (1.651 m)   Wt 165 lb (74.8 kg)   BMI 27.46 kg/m    Well nourished, well developed male in no acute distress. Good spirit.  Answer question appropriately.  Normal affect.  ASSESSMENT & PLAN:    1. NICM - Last echo 07/2018 showed improved LVEF of 60-65%.  No symptoms concerning of CHF.  Continue Coreg and Entresto at current dose. Recent SCr was normal.  2. HTN  -Blood pressure stable on current medication.  3. HLD -Tolerating low-dose Crestor.  LDL improved to 104.  COVID-19 Education: The signs and symptoms of COVID-19 were discussed with the patient and how to seek care for testing (follow up with PCP or arrange E-visit).  The importance of social distancing was discussed today.  Time:   Today, I have spent 10 minutes with the patient with telehealth technology discussing the above problems.     Medication Adjustments/Labs and Tests Ordered: Current medicines are reviewed at length with the patient today.  Concerns regarding medicines are outlined above.   Tests Ordered: No orders of the defined types were placed in this encounter.   Medication Changes: No orders of the defined types were placed in this encounter.   Disposition:  Follow up in 6 month(s)  Signed, Leanor Kail, PA  02/03/2019 2:56 PM    [facility]

## 2019-03-28 ENCOUNTER — Telehealth: Payer: Self-pay | Admitting: Cardiovascular Disease

## 2019-03-28 MED ORDER — SACUBITRIL-VALSARTAN 24-26 MG PO TABS: 1.0000 | ORAL_TABLET | Freq: Two times a day (BID) | ORAL | 3 refills | Status: DC

## 2019-03-28 NOTE — Telephone Encounter (Addendum)
I gave the pt the phone number to reach [employer] pt asst program as I believe he applied through [employer] last year and they are no longer accepting pt asst applications. The pt states that he will call [employer] and request an application, he will fill it out and will get it back to [place] either by mail or he will drop it off at the office. He is aware that I will complete the provider part of the application, have Dr Acie Fredrickson sign it and fax it to [employer] when complete.

## 2019-03-28 NOTE — Telephone Encounter (Signed)
Spoke with patient and advised he should continue Entresto 24-26 mg BID per last ov with Robbie Lis, PA in April. Patient states he has been receiving patient assistance which needs renewal soon. States number to call is [phone] I advised that I am renewing Rx which will expire soon and that I will forward message to [facility], LPN our patient advocate to follow-up with the PA renewal. Patient verbalized understanding and agreement and thanked me for the call.

## 2019-03-28 NOTE — Telephone Encounter (Signed)
New Message   Pt c/o medication issue:  1. Name of Medication: ENTRESTO 24-26 MG    2. How are you currently taking this medication (dosage and times per day)?   3. Are you having a reaction (difficulty breathing--STAT)?   4. What is your medication issue? Patient is calling because he is due to renewal his Enteresto. He wants to know if Dr. Acie Fredrickson would like for him to continue on this medication.

## 2019-04-11 NOTE — Telephone Encounter (Signed)
The pt dropped off a blank [employer] application at the office today. I am unsure of what he wants [place] to do as I discussed [employer] pt asst with him.  I have left a message on his VM asking him to call me back.

## 2019-04-12 ENCOUNTER — Telehealth: Payer: Self-pay

## 2019-04-12 NOTE — Telephone Encounter (Signed)
New message   Patient is returning call in reference to [facility]. Please call.

## 2019-04-13 NOTE — Telephone Encounter (Signed)
**Note De-Identified  Obfuscation** The pts [employer] application has been placed in the offices out going mail bin addressed to the pt.

## 2019-04-13 NOTE — Telephone Encounter (Signed)
**Note De-identified  Obfuscation** See phone note from 03/28/2019 

## 2019-04-13 NOTE — Telephone Encounter (Signed)
The pt is advised that I do not deal with the [facility] and that he and I spoke about [employer] pt asst only. I did advise him that I do provide the [place] phone number to pts that are interested but am unaware of the application process. I have advised him to contact the [facility] as they are very helpful but he seems reluctant.  He is requesting that the [place] application that he left at the office be mailed back to him. I also gave him [employer] pt asst phone number so he can call to see if he is eligible.

## 2019-05-10 ENCOUNTER — Other Ambulatory Visit: Payer: Self-pay | Admitting: Cardiovascular Disease

## 2019-05-16 ENCOUNTER — Telehealth: Payer: Self-pay | Admitting: Pharmacist

## 2019-05-16 NOTE — Telephone Encounter (Signed)
Patient called stating that no one will fill out the provider section of the [employer] foundation application. Asked patient if it is the [employer] foundation or the [employer] patient assistance. Patient said they are the same thing. Informed patient that they are 2 different things. Patient says it says [employer]. I asked the patient to fax over the forms and I would take a look at it. I would fill it out and get the Dr. To sign it once I figured out what he has applying for. Provided fax #

## 2019-05-17 ENCOUNTER — Telehealth: Payer: Self-pay

## 2019-05-17 NOTE — Telephone Encounter (Signed)
Follow Up   Patient is calling back in stating that he has made a mistake on the application. Corrections to be made: Yearly Income: $21,768, patient put his monthly income instead of his yearly income. Patient wants to know if corrections can be made or what he needs to do. Please give patient a call back.

## 2019-05-17 NOTE — Telephone Encounter (Signed)
**Note De-Identified  Obfuscation** The pt left his [employer] pt asst application her at the office. I have completed the provider part of the application, Dr Curt Bears (DOD) has signed and I faxed it to [employer] pt asst.

## 2019-05-18 ENCOUNTER — Other Ambulatory Visit: Payer: Self-pay | Admitting: Internal Medicine

## 2019-05-18 NOTE — Telephone Encounter (Signed)
I left a detailed message on the pts VM stating that I faxed his application to [employer] yesterday and that he will need to contact them at [phone] to make the changes on his application.

## 2019-05-19 ENCOUNTER — Other Ambulatory Visit: Payer: Self-pay | Admitting: Cardiovascular Disease

## 2019-05-19 MED ORDER — ESOMEPRAZOLE MAGNESIUM 40 MG PO CPDR: 40.0000 mg | DELAYED_RELEASE_CAPSULE | ORAL | 5 refills | Status: DC | PRN

## 2019-05-19 NOTE — Telephone Encounter (Signed)
Pt's medication was sent to pt's pharmacy as requested. Confirmation received.  °

## 2019-06-07 ENCOUNTER — Ambulatory Visit: Payer: Medicare Other | Admitting: Internal Medicine

## 2019-06-15 ENCOUNTER — Ambulatory Visit (INDEPENDENT_AMBULATORY_CARE_PROVIDER_SITE_OTHER): Payer: Medicare Other | Admitting: Registered Nurse

## 2019-06-15 ENCOUNTER — Other Ambulatory Visit: Payer: Self-pay

## 2019-06-15 ENCOUNTER — Encounter: Payer: Self-pay | Admitting: Registered Nurse

## 2019-06-15 VITALS — BP 131/78 | HR 69 | Temp 98.8°F | Ht 66.0 in | Wt 170.0 lb

## 2019-06-15 DIAGNOSIS — H1013 Acute atopic conjunctivitis, bilateral: Secondary | ICD-10-CM

## 2019-06-15 MED ORDER — OLOPATADINE HCL 0.1 % OP SOLN: 1.0000 [drp] | OPHTHALMIC | 2 refills | Status: DC | PRN

## 2019-06-15 NOTE — Progress Notes (Signed)
Established Patient Office Visit  Subjective:  Patient ID: Christian [name], male    DOB: 1941-02-21  Age: 78 y.o. MRN: [id number]  CC:  Chief Complaint  Patient presents with  . Allergies    wants eye drop   . Establish Care    HPI Christian [name] presents for visit to establish care, allergic conjunctivitis   Formerly a patient of Molli Barrows, NP, with [facility], and followed by Dr. Cathie Olden from cardiology, from whom he receives his HTN and CHF medications.   Feels good today - appears healthy. Reports he has run out of olopatadine, requesting refill. No other complaints. Denies CPE and labs. Denies flu shot.   Past Medical History:  Diagnosis Date  . Allergic rhinitis due to pollen 03/29/2018  . Chronic systolic heart failure (HCC) - EF improved to normal on Rx    Echo 11/18: Mild LVH, EF 20, diffuse HK mild AI, a sending aorta 42 mm (mildly dilated), mild MR, mild TR, trivial PI, PASP 44 // Echo 3/19: mild LVH, EF 25-30, diff HK, Gr 1 DD, asc aorta 43 mm (mildly dilated), MAC, mild reduced RVSF // Limited echo 10/19: Mild concentric LVH, EF 09-62, grade 1 diastolic dysfunction, MAC, mild LAE   . Coronary artery disease involving native coronary artery of native heart without angina pectoris 11/11/2017   R/L HC 11/18: pLAD 45, oD1 65 ; elevated PCWP (mean 29), moderate pulmonary hypertension, CO 3.66, CI 2.07  . Depression with anxiety 07/08/2017  . Essential hypertension 07/08/2017  . GERD with esophagitis 01/11/2018  . Hernia of abdominal cavity   . Hyperlipidemia   . Hyperlipidemia LDL goal <130 05/25/2017   The 10-year ASCVD risk score Mikey Bussing DC Jr., et al., 2013) is: 31.1%   Values used to calculate the score:     Age: 30 years     Sex: Male     Is Non-Hispanic African American: No     Diabetic: No     Tobacco smoker: No     Systolic Blood Pressure: 836 mmHg     Is BP treated: No     HDL Cholesterol: 68.7 mg/dL     Total Cholesterol: 258 mg/dL   . Idiopathic inflammatory myopathy  05/25/2017  . Neuropathy involving both lower extremities 05/25/2017  . Nonischemic cardiomyopathy ([place]) 11/11/2017   LHC 09/16/17-nonobstructive CAD  . Oropharyngeal dysphagia 01/11/2018  . Type 2 diabetes mellitus with complication, without long-term current use of insulin ([place]) 07/08/2017    Past Surgical History:  Procedure Laterality Date  . BACK SURGERY    . HERNIA REPAIR    . RIGHT/LEFT HEART CATH AND CORONARY ANGIOGRAPHY N/A 09/16/2017   Procedure: RIGHT/LEFT HEART CATH AND CORONARY ANGIOGRAPHY;  Surgeon: Martinique, Peter M, MD;  Location: [place] [facility];  Service: Cardiovascular;  Laterality: N/A;  . SPINE SURGERY      Family History  Problem Relation Age of Onset  . Stroke Mother   . Kidney disease Father   . Stroke Father   . Hypertension Father   . Early death Father   . Cancer Neg Hx   . Alcohol abuse Neg Hx   . Diabetes Neg Hx   . Heart disease Neg Hx   . Hyperlipidemia Neg Hx     Social History   Socioeconomic History  . Marital status: Single    Spouse name: Not on file  . Number of children: Not on file  . Years of education: Not on file  . Highest education  level: Not on file  Occupational History  . Not on file  Social Needs  . Financial resource strain: Not on file  . Food insecurity    Worry: Not on file    Inability: Not on file  . Transportation needs    Medical: Not on file    Non-medical: Not on file  Tobacco Use  . Smoking status: Never Smoker  . Smokeless tobacco: Never Used  Substance and Sexual Activity  . Alcohol use: No  . Drug use: No  . Sexual activity: Not Currently  Lifestyle  . Physical activity    Days per week: Not on file    Minutes per session: Not on file  . Stress: Not on file  Relationships  . Social Herbalist on phone: Not on file    Gets together: Not on file    Attends religious service: Not on file    Active member of club or organization: Not on file    Attends meetings of clubs or organizations:  Not on file    Relationship status: Not on file  . Intimate partner violence    Fear of current or ex partner: Not on file    Emotionally abused: Not on file    Physically abused: Not on file    Forced sexual activity: Not on file  Other Topics Concern  . Not on file  Social History Narrative  . Not on file    Outpatient Medications Prior to Visit  Medication Sig Dispense Refill  . aspirin EC 81 MG tablet Take 1 tablet (81 mg total) by mouth daily. 90 tablet 3  . carvedilol (COREG) 6.25 MG tablet Take 6.25 mg by mouth 2 (two) times daily with a meal.    . cholecalciferol (VITAMIN D) 1000 units tablet Take 1,000 Units by mouth daily.    Marland Kitchen esomeprazole (NEXIUM) 40 MG capsule Take 1 capsule (40 mg total) by mouth as needed (for heartburn). 30 capsule 5  . furosemide (LASIX) 40 MG tablet Take 0.5 tablets (20 mg total) by mouth 3 (three) times a week. 30 tablet 6  . rosuvastatin (CRESTOR) 5 MG tablet Take 1 tablet (5 mg total) by mouth daily. 30 tablet 11  . sacubitril-valsartan (ENTRESTO) 24-26 MG Take 1 tablet by mouth 2 (two) times daily. 180 tablet 3  . olopatadine (PATANOL) 0.1 % ophthalmic solution Place 1 drop into both eyes as needed for allergies.      No facility-administered medications prior to visit.     Allergies  Allergen Reactions  . Amoxicillin Hives  . Bactrim [Sulfamethoxazole-Trimethoprim] Other (See Comments)    Dried out his eyes  . Cephalexin Itching  . Hydrocodone Itching  . Lexapro [Escitalopram Oxalate] Other (See Comments)    disorientation  . Lipitor [Atorvastatin] Other (See Comments)    Damage to calf muscles  . Oxycodone Itching  . Statins Other (See Comments)    Muscle pain   . Vytorin [Ezetimibe-Simvastatin] Other (See Comments)    Damage to calf muscles    ROS Review of Systems  Constitutional: Negative.   HENT: Negative.   Eyes: Positive for redness and itching.  Respiratory: Negative.   Cardiovascular: Negative.   Gastrointestinal:  Negative.   Endocrine: Negative.   Genitourinary: Negative.   Musculoskeletal: Negative.   Skin: Negative.   Allergic/Immunologic: Negative.   Neurological: Negative.   Hematological: Negative.   Psychiatric/Behavioral: Negative.   All other systems reviewed and are negative.     Objective:  Physical Exam  Constitutional: He is oriented to person, place, and time. He appears well-developed and well-nourished. No distress.  Eyes: Scleral icterus is present.  Cardiovascular: Normal rate.  Pulmonary/Chest: Effort normal. No respiratory distress.  Neurological: He is alert and oriented to person, place, and time.  Skin: He is not diaphoretic.  Psychiatric: He has a normal mood and affect. His behavior is normal. Judgment and thought content normal.  Nursing note and vitals reviewed.   BP 131/78 (BP Location: Right Arm, Patient Position: Sitting, Cuff Size: Normal)   Pulse 69   Temp 98.8 F (37.1 C) (Oral)   Ht _0  (1.676 m)   Wt 170 lb (77.1 kg)   SpO2 96%   BMI 27.44 kg/m  Wt Readings from Last 3 Encounters:  06/15/19 170 lb (77.1 kg)  02/03/19 165 lb (74.8 kg)  10/14/18 175 lb 9.6 oz (79.7 kg)     Health Maintenance Due  Topic Date Due  . OPHTHALMOLOGY EXAM  06/12/1951  . FOOT EXAM  07/08/2018  . URINE MICROALBUMIN  01/07/2019  . HEMOGLOBIN A1C  04/07/2019  . INFLUENZA VACCINE  05/21/2019    There are no preventive care reminders to display for this patient.  Lab Results  Component Value Date   TSH 1.22 07/08/2017   Lab Results  Component Value Date   WBC 6.2 10/06/2018   HGB 14.9 10/06/2018   HCT 43.0 10/06/2018   MCV 87 10/06/2018   PLT 214 10/06/2018   Lab Results  Component Value Date   NA 134 10/06/2018   K 4.9 10/06/2018   CO2 20 10/06/2018   GLUCOSE 107 (H) 10/06/2018   BUN 21 10/06/2018   CREATININE 1.15 10/06/2018   BILITOT 0.7 10/06/2018   ALKPHOS 45 10/06/2018   AST 17 10/06/2018   ALT 18 10/06/2018   PROT 6.5 10/06/2018    ALBUMIN 4.4 10/06/2018   CALCIUM 9.0 10/06/2018   ANIONGAP 8 09/17/2017   GFR 83.56 03/29/2018   Lab Results  Component Value Date   CHOL 177 10/06/2018   Lab Results  Component Value Date   HDL 50 10/06/2018   Lab Results  Component Value Date   LDLCALC 104 (H) 10/06/2018   Lab Results  Component Value Date   TRIG 116 10/06/2018   Lab Results  Component Value Date   CHOLHDL 3.5 10/06/2018   Lab Results  Component Value Date   HGBA1C 6.3 (H) 10/06/2018      Assessment & Plan:   Problem List Items Addressed This Visit    None    Visit Diagnoses    Allergic conjunctivitis of both eyes    -  Primary   Relevant Medications   olopatadine (PATANOL) 0.1 % ophthalmic solution      Meds ordered this encounter  Medications  . olopatadine (PATANOL) 0.1 % ophthalmic solution    Sig: Place 1 drop into both eyes as needed for allergies.    Dispense:  5 mL    Refill:  2    Order Specific Question:   Supervising Provider    Answer:   Forrest Moron [id number]    Follow-up: No follow-ups on file.   PLAN  Olopatadine refilled  Discussed scheduling CPE - states he "doesn't feel he needs that". Hoping to see him in around 6 months.  He will see Cardiology soon  Patient encouraged to call clinic with any questions, comments, or concerns.   Maximiano Coss, NP

## 2019-06-15 NOTE — Patient Instructions (Signed)
° ° ° °  If you have lab work done today you will be contacted with your lab results within the next 2 weeks.  If you have not heard from us then please contact us. The fastest way to get your results is to register for My Chart. ° ° °IF you received an x-ray today, you will receive an invoice from [facility]. Please contact [facility] at [phone] with questions or concerns regarding your invoice.  ° °IF you received labwork today, you will receive an invoice from [facility]. Please contact [facility] at [phone] with questions or concerns regarding your invoice.  ° °Our billing staff will not be able to assist you with questions regarding bills from these companies. ° °You will be contacted with the lab results as soon as they are available. The fastest way to get your results is to activate your My Chart account. Instructions are located on the last page of this paperwork. If you have not heard from us regarding the results in 2 weeks, please contact this office. °  ° ° ° °

## 2019-06-21 ENCOUNTER — Telehealth: Payer: Self-pay | Admitting: *Deleted

## 2019-06-21 NOTE — Telephone Encounter (Signed)
Christian [name], pt would like to schedule a AWV. Please advise at [phone]

## 2019-07-18 ENCOUNTER — Other Ambulatory Visit: Payer: Self-pay | Admitting: Physician Assistant

## 2019-07-21 ENCOUNTER — Other Ambulatory Visit: Payer: Self-pay | Admitting: Physician Assistant

## 2019-09-12 ENCOUNTER — Other Ambulatory Visit: Payer: Self-pay | Admitting: Physician Assistant

## 2019-09-14 ENCOUNTER — Encounter: Payer: Self-pay | Admitting: Cardiovascular Disease

## 2019-09-14 ENCOUNTER — Other Ambulatory Visit: Payer: Self-pay

## 2019-09-14 ENCOUNTER — Ambulatory Visit: Payer: Medicare Other | Admitting: Cardiovascular Disease

## 2019-09-14 VITALS — BP 114/86 | HR 94 | Ht 66.0 in | Wt 172.1 lb

## 2019-09-14 DIAGNOSIS — I428 Other cardiomyopathies: Secondary | ICD-10-CM | POA: Diagnosis not present

## 2019-09-14 DIAGNOSIS — E785 Hyperlipidemia, unspecified: Secondary | ICD-10-CM

## 2019-09-14 DIAGNOSIS — I10 Essential (primary) hypertension: Secondary | ICD-10-CM | POA: Diagnosis not present

## 2019-09-14 DIAGNOSIS — I2510 Atherosclerotic heart disease of native coronary artery without angina pectoris: Secondary | ICD-10-CM | POA: Diagnosis not present

## 2019-09-14 DIAGNOSIS — I5022 Chronic systolic (congestive) heart failure: Secondary | ICD-10-CM

## 2019-09-14 NOTE — Patient Instructions (Signed)
Medication Instructions:  Your physician recommends that you continue on your current medications as directed. Please refer to the Current Medication list given to you today.  *If you need a refill on your cardiac medications before your next appointment, please call your pharmacy*  Lab Work: TODAY - basic metabolic panel, liver panel, cholesterol If you have labs (blood work) drawn today and your tests are completely normal, you will receive your results only by: Marland Kitchen MyChart Message (if you have MyChart) OR . A paper copy in the mail If you have any lab test that is abnormal or we need to change your treatment, we will call you to review the results.   Testing/Procedures: None Ordered   Follow-Up: At [facility], you and your health needs are our priority.  As part of our continuing mission to provide you with exceptional heart care, we have created designated Provider Care Teams.  These Care Teams include your primary Cardiologist (physician) and Advanced Practice Providers (APPs -  Physician Assistants and Nurse Practitioners) who all work together to provide you with the care you need, when you need it.  Your next appointment:   6 month(s)  The format for your next appointment:   Either In Person or Virtual  Provider:   You may see Mertie Moores, MD or one of the following Advanced Practice Providers on your designated Care Team:    Richardson Dopp, PA-C  [place], [place]  Daune Perch, [place]

## 2019-09-14 NOTE — Progress Notes (Signed)
Cardiology Office Note:    Date:  09/14/2019   ID:  Christian [name], DOB [birth year], MRN [id number]  PCP:  Maximiano Coss, NP  Cardiologist:  Mertie Moores, MD   Referring MD: Maximiano Coss, NP   Problem list  1.  Hypertension 2.  Hyperlipidemia 3.  Diabetes mellitus 4.  Chronic systolic congestive heart failure -  EF 20% in Nov. 2018  Normal coronaries 5.  Myopathy - ? Due to statins    Chief Complaint  Patient presents with  . Congestive Heart Failure     December 18, 2017    Christian [name] is a 78 y.o. male with a hx of congestive heart failure, hypertension, hyperlipidemia.  Met him in the hospital in Nov. 2018.  Was found to have CHF - EF 20%. No CP  Hyperlipidemia  - took statins in the rermote past ,  Has prolonged muscle pains in response to statins  Has been taking prednisone on occasion Has been on Coreg and Losartan   September 14, 2019:  Christian [name] is seen today for follow-up of his chronic systolic congestive heart failure. When I saw him last year we started him on Entresto. His left-ventricular function has improved ( from 30% - 65%) by echo in October, 2019.  He has a history of diabetes mellitus hypertension hyperlipidemia. Been intolerant to statins.  Feels well .    No CP , no dyspnea   Past Medical History:  Diagnosis Date  . Allergic rhinitis due to pollen 03/29/2018  . Chronic systolic heart failure (HCC) - EF improved to normal on Rx    Echo 11/18: Mild LVH, EF 20, diffuse HK mild AI, a sending aorta 42 mm (mildly dilated), mild MR, mild TR, trivial PI, PASP 44 // Echo 3/19: mild LVH, EF 25-30, diff HK, Gr 1 DD, asc aorta 43 mm (mildly dilated), MAC, mild reduced RVSF // Limited echo 10/19: Mild concentric LVH, EF 98-92, grade 1 diastolic dysfunction, MAC, mild LAE   . Coronary artery disease involving native coronary artery of native heart without angina pectoris 11/11/2017   R/L HC 11/18: pLAD 45, oD1 65 ; elevated PCWP (mean 29), moderate pulmonary  hypertension, CO 3.66, CI 2.07  . Depression with anxiety 07/08/2017  . Essential hypertension 07/08/2017  . GERD with esophagitis 01/11/2018  . Hernia of abdominal cavity   . Hyperlipidemia   . Hyperlipidemia LDL goal <130 05/25/2017   The 10-year ASCVD risk score Mikey Bussing DC Jr., et al., 2013) is: 31.1%   Values used to calculate the score:     Age: 39 years     Sex: Male     Is Non-Hispanic African American: No     Diabetic: No     Tobacco smoker: No     Systolic Blood Pressure: 119 mmHg     Is BP treated: No     HDL Cholesterol: 68.7 mg/dL     Total Cholesterol: 258 mg/dL   . Idiopathic inflammatory myopathy 05/25/2017  . Neuropathy involving both lower extremities 05/25/2017  . Nonischemic cardiomyopathy ([place]) 11/11/2017   LHC 09/16/17-nonobstructive CAD  . Oropharyngeal dysphagia 01/11/2018  . Type 2 diabetes mellitus with complication, without long-term current use of insulin ([place]) 07/08/2017    Past Surgical History:  Procedure Laterality Date  . BACK SURGERY    . HERNIA REPAIR    . RIGHT/LEFT HEART CATH AND CORONARY ANGIOGRAPHY N/A 09/16/2017   Procedure: RIGHT/LEFT HEART CATH AND CORONARY ANGIOGRAPHY;  Surgeon: Martinique, Peter M, MD;  Location: [facility]  INVASIVE CV LAB;  Service: Cardiovascular;  Laterality: N/A;  . SPINE SURGERY      Current Medications: Current Meds  Medication Sig  . aspirin EC 81 MG tablet Take 1 tablet (81 mg total) by mouth daily.  . carvedilol (COREG) 6.25 MG tablet TAKE 1 AND 1/2 TABLETS BY MOUTH TWICE DAILY  . cholecalciferol (VITAMIN D) 1000 units tablet Take 1,000 Units by mouth daily.  Marland Kitchen esomeprazole (NEXIUM) 40 MG capsule Take 1 capsule (40 mg total) by mouth as needed (for heartburn).  . furosemide (LASIX) 40 MG tablet Take 0.5 tablets (20 mg total) by mouth 3 (three) times a week.  Marland Kitchen olopatadine (PATANOL) 0.1 % ophthalmic solution Place 1 drop into both eyes as needed for allergies.  . rosuvastatin (CRESTOR) 5 MG tablet Take 1 tablet (5 mg total) by mouth daily.   . sacubitril-valsartan (ENTRESTO) 24-26 MG Take 1 tablet by mouth 2 (two) times daily.     Allergies:   Amoxicillin, Bactrim [sulfamethoxazole-trimethoprim], Cephalexin, Hydrocodone, Lexapro [escitalopram oxalate], Lipitor [atorvastatin], Oxycodone, Statins, and Vytorin [ezetimibe-simvastatin]   Social History   Socioeconomic History  . Marital status: Single    Spouse name: Not on file  . Number of children: Not on file  . Years of education: Not on file  . Highest education level: Not on file  Occupational History  . Not on file  Social Needs  . Financial resource strain: Not on file  . Food insecurity    Worry: Not on file    Inability: Not on file  . Transportation needs    Medical: Not on file    Non-medical: Not on file  Tobacco Use  . Smoking status: Never Smoker  . Smokeless tobacco: Never Used  Substance and Sexual Activity  . Alcohol use: No  . Drug use: No  . Sexual activity: Not Currently  Lifestyle  . Physical activity    Days per week: Not on file    Minutes per session: Not on file  . Stress: Not on file  Relationships  . Social Herbalist on phone: Not on file    Gets together: Not on file    Attends religious service: Not on file    Active member of club or organization: Not on file    Attends meetings of clubs or organizations: Not on file    Relationship status: Not on file  Other Topics Concern  . Not on file  Social History Narrative  . Not on file     Family History: The patient's family history includes Early death in his father; Hypertension in his father; Kidney disease in his father; Stroke in his father and mother. There is no history of Cancer, Alcohol abuse, Diabetes, Heart disease, or Hyperlipidemia.  ROS:   Please see the history of present illness.     All other systems reviewed and are negative.  EKGs/Labs/Other Studies Reviewed:    The following studies were reviewed today:   EKG:    Recent Labs: 10/06/2018:  ALT 18; BUN 21; Creatinine, Ser 1.15; Hemoglobin 14.9; Platelets 214; Potassium 4.9; Sodium 134  Recent Lipid Panel    Component Value Date/Time   CHOL 177 10/06/2018 1601   TRIG 116 10/06/2018 1601   HDL 50 10/06/2018 1601   CHOLHDL 3.5 10/06/2018 1601   CHOLHDL 4 07/08/2017 1212   VLDL 21.8 07/08/2017 1212   LDLCALC 104 (H) 10/06/2018 1601    Physical Exam: Blood pressure 114/86, pulse 94, height _0  (  1.676 m), weight 172 lb 1.9 oz (78.1 kg), SpO2 95 %.  GEN:  Well nourished, well developed in no acute distress HEENT: Normal NECK: No JVD; No carotid bruits LYMPHATICS: No lymphadenopathy CARDIAC: RRR , no murmurs, rubs, gallops RESPIRATORY:  Clear to auscultation without rales, wheezing or rhonchi  ABDOMEN: Soft, non-tender, non-distended MUSCULOSKELETAL:  No edema; No deformity  SKIN: Warm and dry NEUROLOGIC:  Alert and oriented x 3   ASSESSMENT:    1. Coronary artery disease involving native coronary artery of native heart without angina pectoris   2. Hyperlipidemia LDL goal <130   3. NICM (nonischemic cardiomyopathy) ([place])   4. Essential hypertension   5. Chronic systolic heart failure (HCC)    PLAN:    In order of problems listed above:  1.  Chronic systolic congestive heart failure:   EF has normalized on Entresto.  He seems to be doing well.  He is not having any episodes of chest pain or shortness of breath.   2.  Hyperlipidemia: Continue current medications.  His labs from last year look good.  We will check labs today.     Medication Adjustments/Labs and Tests Ordered: Current medicines are reviewed at length with the patient today.  Concerns regarding medicines are outlined above.  Orders Placed This Encounter  Procedures  . Lipid Profile  . Basic Metabolic Panel (BMET)  . Hepatic function panel  . EKG 12-Lead   No orders of the defined types were placed in this encounter.   Signed, Mertie Moores, MD  09/14/2019 5:17 PM    [place]

## 2019-09-15 LAB — BASIC METABOLIC PANEL
BUN/Creatinine Ratio: 18 (ref 10–24)
BUN: 19 mg/dL (ref 8–27)
CO2: 19 mmol/L — ABNORMAL LOW (ref 20–29)
Calcium: 8.8 mg/dL (ref 8.6–10.2)
Chloride: 103 mmol/L (ref 96–106)
Creatinine, Ser: 1.03 mg/dL (ref 0.76–1.27)
GFR calc Af Amer: 79 mL/min/{1.73_m2} (ref >59)
GFR calc non Af Amer: 69 mL/min/{1.73_m2} (ref >59)
Glucose: 95 mg/dL (ref 65–99)
Potassium: 4.6 mmol/L (ref 3.5–5.2)
Sodium: 140 mmol/L (ref 134–144)

## 2019-09-15 LAB — LIPID PANEL
Chol/HDL Ratio: 2.9 ratio (ref 0.0–5.0)
Cholesterol, Total: 135 mg/dL (ref 100–199)
HDL: 46 mg/dL (ref >39)
LDL Chol Calc (NIH): 66 mg/dL (ref 0–99)
Triglycerides: 127 mg/dL (ref 0–149)
VLDL Cholesterol Cal: 23 mg/dL (ref 5–40)

## 2019-09-15 LAB — HEPATIC FUNCTION PANEL
ALT: 18 IU/L (ref 0–44)
AST: 18 IU/L (ref 0–40)
Albumin: 4.5 g/dL (ref 3.7–4.7)
Alkaline Phosphatase: 51 IU/L (ref 39–117)
Bilirubin Total: 1 mg/dL (ref 0.0–1.2)
Bilirubin, Direct: 0.29 mg/dL (ref 0.00–0.40)
Total Protein: 6.4 g/dL (ref 6.0–8.5)

## 2019-09-21 ENCOUNTER — Other Ambulatory Visit: Payer: Self-pay | Admitting: Cardiovascular Disease

## 2019-09-22 ENCOUNTER — Telehealth: Payer: Self-pay

## 2019-09-22 NOTE — Telephone Encounter (Signed)
**Note De-Identified  Obfuscation** The pts [employer] Pt Asst application for Delene Loll was left at the office. I have completed the provider part of the application and am awaiting the MDs signature.

## 2019-09-23 NOTE — Telephone Encounter (Signed)
Dr Acie Fredrickson has signed the pts [employer] pt asst application and I have faxed all to [employer] pt asst program.

## 2019-10-10 ENCOUNTER — Telehealth: Payer: Self-pay | Admitting: Registered Nurse

## 2019-10-10 NOTE — Telephone Encounter (Signed)
Please advise not sure if this is a med that we cam refill without your permission

## 2019-10-10 NOTE — Telephone Encounter (Signed)
Pt requesting refill  olopatadine (PATANOL) 0.1 % ophthalmic solution [id number]   With the summer coming he is requesting 8 refills .    [facility] [place] [address]

## 2019-10-10 NOTE — Telephone Encounter (Signed)
No worries from me - you can go ahead and fill it! Thanks,  Kathrin Ruddy, NP

## 2019-10-12 ENCOUNTER — Other Ambulatory Visit: Payer: Self-pay | Admitting: Emergency Medicine

## 2019-10-12 ENCOUNTER — Ambulatory Visit (INDEPENDENT_AMBULATORY_CARE_PROVIDER_SITE_OTHER): Payer: Medicare Other | Admitting: Family Medicine

## 2019-10-12 VITALS — BP 131/78 | Ht 66.0 in | Wt 172.0 lb

## 2019-10-12 DIAGNOSIS — Z0000 Encounter for general adult medical examination without abnormal findings: Secondary | ICD-10-CM

## 2019-10-12 DIAGNOSIS — H1013 Acute atopic conjunctivitis, bilateral: Secondary | ICD-10-CM

## 2019-10-12 MED ORDER — OLOPATADINE HCL 0.1 % OP SOLN: 1.0000 [drp] | OPHTHALMIC | 2 refills | Status: DC | PRN

## 2019-10-12 NOTE — Telephone Encounter (Signed)
Rx sent to pharm

## 2019-10-12 NOTE — Patient Instructions (Addendum)
Thank you for taking time to come for your Medicare Wellness Visit. I appreciate your ongoing commitment to your health goals. Please review the following plan we discussed and let me know if I can assist you in the future.  Christian Kennedy LPN  Preventive Care 79 Years and Older, Male Preventive care refers to lifestyle choices and visits with your health care provider that can promote health and wellness. This includes:  A yearly physical exam. This is also called an annual well check.  Regular dental and eye exams.  Immunizations.  Screening for certain conditions.  Healthy lifestyle choices, such as diet and exercise. What can I expect for my preventive care visit? Physical exam Your health care provider will check:  Height and weight. These may be used to calculate body mass index (BMI), which is a measurement that tells if you are at a healthy weight.  Heart rate and blood pressure.  Your skin for abnormal spots. Counseling Your health care provider may ask you questions about:  Alcohol, tobacco, and drug use.  Emotional well-being.  Home and relationship well-being.  Sexual activity.  Eating habits.  History of falls.  Memory and ability to understand (cognition).  Work and work Statistician. What immunizations do I need?  Influenza (flu) vaccine  This is recommended every year. Tetanus, diphtheria, and pertussis (Tdap) vaccine  You may need a Td booster every 10 years. Varicella (chickenpox) vaccine  You may need this vaccine if you have not already been vaccinated. Zoster (shingles) vaccine  You may need this after age 66. Pneumococcal conjugate (PCV13) vaccine  One dose is recommended after age 84. Pneumococcal polysaccharide (PPSV23) vaccine  One dose is recommended after age 88. Measles, mumps, and rubella (MMR) vaccine  You may need at least one dose of MMR if you were born in 1957 or later. You may also need a second dose. Meningococcal  conjugate (MenACWY) vaccine  You may need this if you have certain conditions. Hepatitis A vaccine  You may need this if you have certain conditions or if you travel or work in places where you may be exposed to hepatitis A. Hepatitis B vaccine  You may need this if you have certain conditions or if you travel or work in places where you may be exposed to hepatitis B. Haemophilus influenzae type b (Hib) vaccine  You may need this if you have certain conditions. You may receive vaccines as individual doses or as more than one vaccine together in one shot (combination vaccines). Talk with your health care provider about the risks and benefits of combination vaccines. What tests do I need? Blood tests  Lipid and cholesterol levels. These may be checked every 5 years, or more frequently depending on your overall health.  Hepatitis C test.  Hepatitis B test. Screening  Lung cancer screening. You may have this screening every year starting at age 24 if you have a 30-pack-year history of smoking and currently smoke or have quit within the past 15 years.  Colorectal cancer screening. All adults should have this screening starting at age 52 and continuing until age 61. Your health care provider may recommend screening at age 57 if you are at increased risk. You will have tests every 1-10 years, depending on your results and the type of screening test.  Prostate cancer screening. Recommendations will vary depending on your family history and other risks.  Diabetes screening. This is done by checking your blood sugar (glucose) after you have not eaten for  a while (fasting). You may have this done every 1-3 years.  Abdominal aortic aneurysm (AAA) screening. You may need this if you are a current or former smoker.  Sexually transmitted disease (STD) testing. Follow these instructions at home: Eating and drinking  Eat a diet that includes fresh fruits and vegetables, whole grains, lean  protein, and low-fat dairy products. Limit your intake of foods with high amounts of sugar, saturated fats, and salt.  Take vitamin and mineral supplements as recommended by your health care provider.  Do not drink alcohol if your health care provider tells you not to drink.  If you drink alcohol: ? Limit how much you have to 0-2 drinks a day. ? Be aware of how much alcohol is in your drink. In the [place] one drink equals one 12 oz bottle of beer (355 mL), one 5 oz glass of wine (148 mL), or one 1 oz glass of hard liquor (44 mL). Lifestyle  Take daily care of your teeth and gums.  Stay active. Exercise for at least 30 minutes on 5 or more days each week.  Do not use any products that contain nicotine or tobacco, such as cigarettes, e-cigarettes, and chewing tobacco. If you need help quitting, ask your health care provider.  If you are sexually active, practice safe sex. Use a condom or other form of protection to prevent STIs (sexually transmitted infections).  Talk with your health care provider about taking a low-dose aspirin or statin. What's next?  Visit your health care provider once a year for a well check visit.  Ask your health care provider how often you should have your eyes and teeth checked.  Stay up to date on all vaccines. This information is not intended to replace advice given to you by your health care provider. Make sure you discuss any questions you have with your health care provider. Document Released: 11/02/2015 Document Revised: 09/30/2018 Document Reviewed: 09/30/2018 Elsevier Patient Education  2020 [employer].

## 2019-10-12 NOTE — Progress Notes (Signed)
Presents today for [employer] Visit   Date of last exam: 06-15-2019  Interpreter used for this visit? No  I connected with  [name] on 10/12/19 by a telephone  and verified that I am speaking with the correct person using two identifiers.   I discussed the limitations of evaluation and management by telemedicine. The patient expressed understanding and agreed to proceed.   Patient Care Team: Maximiano Coss, NP as PCP - General (Adult Health Nurse Practitioner) Nahser, [name], MD as PCP - Cardiology (Cardiology)   Other items to address today:   Discussed Eye/Dental Discussed immunizations Patient has appointment scheduled 10-25-2018     Other Screening: Last screening for diabetes: 10/06/2018 Last lipid screening: 10/06/1218  ADVANCE DIRECTIVES: Discussed: no On File: yes Materials Provided: no  Immunization status:  Immunization History  Administered Date(s) Administered   Influenza, High Dose Seasonal PF 07/08/2017   Influenza,inj,Quad PF,6+ Mos 08/04/2018   Pneumococcal Conjugate-13 08/25/2014   Pneumococcal Polysaccharide-23 09/04/2015   Tdap 08/25/2014     Health Maintenance Due  Topic Date Due   OPHTHALMOLOGY EXAM  06/12/1951   FOOT EXAM  07/08/2018   URINE MICROALBUMIN  01/07/2019   HEMOGLOBIN A1C  04/07/2019   INFLUENZA VACCINE  05/21/2019     Functional Status Survey: Is the patient deaf or have difficulty hearing?: No(trouble hearing a little) Does the patient have difficulty seeing, even when wearing glasses/contacts?: No Does the patient have difficulty concentrating, remembering, or making decisions?: No Does the patient have difficulty walking or climbing stairs?: No Does the patient have difficulty dressing or bathing?: No Does the patient have difficulty doing errands alone such as visiting a doctor's office or shopping?: No   6CIT Screen 10/12/2019  What Year? 0 points  What month? 0 points  What  time? 0 points  Count back from 20 0 points  Months in reverse 0 points  Repeat phrase 0 points  Total Score 0        Clinical Support from 10/12/2019 in [facility]  AUDIT-C Score  0       Home Environment:    Lives in one story home No trouble climbing stairs No scattered rugs No grab bars Adequate lighting/no clutter   Patient Active Problem List   Diagnosis Date Noted   Allergic rhinitis due to pollen 03/29/2018   Oropharyngeal dysphagia 01/11/2018   GERD with esophagitis 01/11/2018   Claudication of both lower extremities ([place]) 01/06/2018   Nonischemic cardiomyopathy ([place]) 11/11/2017   Coronary artery disease involving native coronary artery of native heart without angina pectoris 97/67/3419   Chronic systolic heart failure ([place])    Essential hypertension 07/08/2017   Depression with anxiety 07/08/2017   Type 2 diabetes mellitus with complication, without long-term current use of insulin ([place]) 07/08/2017   Hyperlipidemia LDL goal <130 05/25/2017     Past Medical History:  Diagnosis Date   Allergic rhinitis due to pollen 3/79/0240   Chronic systolic heart failure ([place]) - EF improved to normal on Rx    Echo 11/18: Mild LVH, EF 20, diffuse HK mild AI, a sending aorta 42 mm (mildly dilated), mild MR, mild TR, trivial PI, PASP 44 // Echo 3/19: mild LVH, EF 25-30, diff HK, Gr 1 DD, asc aorta 43 mm (mildly dilated), MAC, mild reduced RVSF // Limited echo 10/19: Mild concentric LVH, EF 97-35, grade 1 diastolic dysfunction, MAC, mild LAE    Coronary artery disease involving native coronary artery of  native heart without angina pectoris 11/11/2017   R/L HC 11/18: pLAD 45, oD1 65 ; elevated PCWP (mean 29), moderate pulmonary hypertension, CO 3.66, CI 2.07   Depression with anxiety 07/08/2017   Essential hypertension 07/08/2017   GERD with esophagitis 01/11/2018   Hernia of abdominal cavity    Hyperlipidemia    Hyperlipidemia LDL goal <130  05/25/2017   The 10-year ASCVD risk score Mikey Bussing DC Jr., et al., 2013) is: 31.1%   Values used to calculate the score:     Age: 46 years     Sex: Male     Is Non-Hispanic African American: No     Diabetic: No     Tobacco smoker: No     Systolic Blood Pressure: 614 mmHg     Is BP treated: No     HDL Cholesterol: 68.7 mg/dL     Total Cholesterol: 258 mg/dL    Idiopathic inflammatory myopathy 05/25/2017   Neuropathy involving both lower extremities 05/25/2017   Nonischemic cardiomyopathy ([place]) 11/11/2017   LHC 09/16/17-nonobstructive CAD   Oropharyngeal dysphagia 01/11/2018   Type 2 diabetes mellitus with complication, without long-term current use of insulin ([place]) 07/08/2017     Past Surgical History:  Procedure Laterality Date   BACK SURGERY     HERNIA REPAIR     RIGHT/LEFT HEART CATH AND CORONARY ANGIOGRAPHY N/A 09/16/2017   Procedure: RIGHT/LEFT HEART CATH AND CORONARY ANGIOGRAPHY;  Surgeon: Martinique, Peter M, MD;  Location: [facility];  Service: Cardiovascular;  Laterality: N/A;   SPINE SURGERY       Family History  Problem Relation Age of Onset   Stroke Mother    Kidney disease Father    Stroke Father    Hypertension Father    Early death Father    Cancer Neg Hx    Alcohol abuse Neg Hx    Diabetes Neg Hx    Heart disease Neg Hx    Hyperlipidemia Neg Hx      Social History   Socioeconomic History   Marital status: Single    Spouse name: Not on file   Number of children: Not on file   Years of education: Not on file   Highest education level: Not on file  Occupational History   Not on file  Tobacco Use   Smoking status: Never Smoker   Smokeless tobacco: Never Used  Substance and Sexual Activity   Alcohol use: No   Drug use: No   Sexual activity: Not Currently  Other Topics Concern   Not on file  Social History Narrative   Not on file   Social Determinants of Health   Financial Resource Strain:    Difficulty of Paying Living  Expenses: Not on file  Food Insecurity:    Worried About Running Out of Food in the Last Year: Not on file   Ran Out of Food in the Last Year: Not on file  Transportation Needs:    Lack of Transportation (Medical): Not on file   Lack of Transportation (Non-Medical): Not on file  Physical Activity:    Days of Exercise per Week: Not on file   Minutes of Exercise per Session: Not on file  Stress:    Feeling of Stress : Not on file  Social Connections:    Frequency of Communication with Friends and Family: Not on file   Frequency of Social Gatherings with Friends and Family: Not on file   Attends Religious Services: Not on file   Active  Member of Clubs or Organizations: Not on file   Attends Archivist Meetings: Not on file   Marital Status: Not on file  Intimate Partner Violence:    Fear of Current or Ex-Partner: Not on file   Emotionally Abused: Not on file   Physically Abused: Not on file   Sexually Abused: Not on file     Allergies  Allergen Reactions   Amoxicillin Hives   Bactrim [Sulfamethoxazole-Trimethoprim] Other (See Comments)    Dried out his eyes   Cephalexin Itching   Hydrocodone Itching   Lexapro [Escitalopram Oxalate] Other (See Comments)    disorientation   Lipitor [Atorvastatin] Other (See Comments)    Damage to calf muscles   Oxycodone Itching   Statins Other (See Comments)    Muscle pain    Vytorin [Ezetimibe-Simvastatin] Other (See Comments)    Damage to calf muscles     Prior to Admission medications   Medication Sig Start Date End Date Taking? Authorizing Provider  aspirin EC 81 MG tablet Take 1 tablet (81 mg total) by mouth daily. 07/08/17  Yes Janith Lima, MD  carvedilol (COREG) 6.25 MG tablet TAKE 1 AND 1/2 TABLETS BY MOUTH TWICE DAILY 09/12/19  Yes Nahser, [name], MD  cholecalciferol (VITAMIN D) 1000 units tablet Take 1,000 Units by mouth daily.   Yes [provider]  esomeprazole (NEXIUM) 40  MG capsule Take 1 capsule (40 mg total) by mouth as needed (for heartburn). 05/19/19  Yes Nahser, [name], MD  olopatadine (PATANOL) 0.1 % ophthalmic solution Place 1 drop into both eyes as needed for allergies. 10/12/19  Yes Maximiano Coss, NP  rosuvastatin (CRESTOR) 5 MG tablet TAKE 1 TABLET BY MOUTH DAILY 09/21/19  Yes Nahser, [name], MD  sacubitril-valsartan (ENTRESTO) 24-26 MG Take 1 tablet by mouth 2 (two) times daily. 03/28/19  Yes Nahser, [name], MD  furosemide (LASIX) 40 MG tablet Take 0.5 tablets (20 mg total) by mouth 3 (three) times a week. Patient not taking: Reported on 10/12/2019 10/18/18   Fay Records, MD     Depression screen [facility] 2/9 10/12/2019 10/06/2018 10/01/2018 03/29/2018 07/08/2017  Decreased Interest 0 0 0 1 3  Down, Depressed, Hopeless 0 0 0 0 1  PHQ - 2 Score 0 0 0 1 4  Altered sleeping - 0 0 0 1  Tired, decreased energy - 0 0 0 2  Change in appetite - 0 0 0 0  Feeling bad or failure about yourself  - 0 0 1 0  Trouble concentrating - 0 0 0 0  Moving slowly or fidgety/restless - 0 0 0 0  Suicidal thoughts - 0 0 1 0  PHQ-9 Score - 0 0 3 7  Difficult doing work/chores - - - - Not difficult at all     Fall Risk  10/12/2019 06/15/2019 10/06/2018 07/08/2017 07/08/2017  Falls in the past year? 0 0 0 No No  Number falls in past yr: 0 0 0 - -  Injury with Fall? 0 0 0 - -  Follow up Falls evaluation completed;Education provided Falls evaluation completed - - -      PHYSICAL EXAM: BP 131/78 Comment: taken from previous visit   Ht _0  (1.676 m)    Wt 172 lb (78 kg)    BMI 27.76 kg/m    Wt Readings from Last 3 Encounters:  10/12/19 172 lb (78 kg)  09/14/19 172 lb 1.9 oz (78.1 kg)  06/15/19 170 lb (77.1 kg)  Education/Counseling provided regarding diet and exercise, prevention of chronic diseases, smoking/tobacco cessation, if applicable, and reviewed "Covered Medicare Preventive Services."

## 2019-10-26 ENCOUNTER — Encounter: Payer: Self-pay | Admitting: Registered Nurse

## 2019-10-26 ENCOUNTER — Ambulatory Visit (INDEPENDENT_AMBULATORY_CARE_PROVIDER_SITE_OTHER): Payer: Medicare Other | Admitting: Registered Nurse

## 2019-10-26 ENCOUNTER — Other Ambulatory Visit: Payer: Self-pay

## 2019-10-26 VITALS — BP 120/77 | HR 71 | Temp 98.1°F | Ht 66.0 in | Wt 172.2 lb

## 2019-10-26 DIAGNOSIS — K0510 Chronic gingivitis, plaque induced: Secondary | ICD-10-CM

## 2019-10-26 DIAGNOSIS — H1013 Acute atopic conjunctivitis, bilateral: Secondary | ICD-10-CM

## 2019-10-26 MED ORDER — DOXYCYCLINE HYCLATE 100 MG PO TABS: 100.0000 mg | ORAL_TABLET | Freq: Two times a day (BID) | ORAL | 1 refills | Status: DC

## 2019-10-26 MED ORDER — OLOPATADINE HCL 0.1 % OP SOLN: 1.0000 [drp] | OPHTHALMIC | 2 refills | Status: DC | PRN

## 2019-10-26 NOTE — Progress Notes (Signed)
Acute Office Visit  Subjective:    Patient ID: Christian [name], male    DOB: 06/02/1941, 79 y.o.   MRN: [id number]  Chief Complaint  Patient presents with  . swollen gums    upper left gums swollen gums and also sore.Right side hurts when food in teeth. patient stated he had been prescribed doxycycline and it worked for a while.   . Medication Refill    needs olopatadine for eyes and allergies    HPI Patient is in today for gum swelling  This has happened before. Notes that it is occurring on the upper L side of his mouth. He states that he has been seen by dentistry, who suggested that his crown was infected and that he would need a root canal to solve his issue. However, he cannot afford this and would rather opt for occasional courses of abx.  Notes tenderness and redness in the gums. No signs of systemic symptoms. He is followed by cardiology.  He is also requesting a refill on his olopatadine ophthalmic solution.   Past Medical History:  Diagnosis Date  . Allergic rhinitis due to pollen 03/29/2018  . Chronic systolic heart failure ([facility]) - EF improved to normal on Rx    Echo 11/18: Mild LVH, EF 20, diffuse HK mild AI, a sending aorta 42 mm (mildly dilated), mild MR, mild TR, trivial PI, PASP 44 // Echo 3/19: mild LVH, EF 25-30, diff HK, Gr 1 DD, asc aorta 43 mm (mildly dilated), MAC, mild reduced RVSF // Limited echo 10/19: Mild concentric LVH, EF 93-90, grade 1 diastolic dysfunction, MAC, mild LAE   . Coronary artery disease involving native coronary artery of native heart without angina pectoris 11/11/2017   R/L HC 11/18: pLAD 45, oD1 65 ; elevated PCWP (mean 29), moderate pulmonary hypertension, CO 3.66, CI 2.07  . Depression with anxiety 07/08/2017  . Essential hypertension 07/08/2017  . GERD with esophagitis 01/11/2018  . Hernia of abdominal cavity   . Hyperlipidemia   . Hyperlipidemia LDL goal <130 05/25/2017   The 10-year ASCVD risk score Mikey Bussing DC Jr., et al., 2013) is: 31.1%    Values used to calculate the score:     Age: 47 years     Sex: Male     Is Non-Hispanic African American: No     Diabetic: No     Tobacco smoker: No     Systolic Blood Pressure: 300 mmHg     Is BP treated: No     HDL Cholesterol: 68.7 mg/dL     Total Cholesterol: 258 mg/dL   . Idiopathic inflammatory myopathy 05/25/2017  . Neuropathy involving both lower extremities 05/25/2017  . Nonischemic cardiomyopathy ([place]) 11/11/2017   LHC 09/16/17-nonobstructive CAD  . Oropharyngeal dysphagia 01/11/2018  . Type 2 diabetes mellitus with complication, without long-term current use of insulin ([place]) 07/08/2017    Past Surgical History:  Procedure Laterality Date  . BACK SURGERY    . HERNIA REPAIR    . RIGHT/LEFT HEART CATH AND CORONARY ANGIOGRAPHY N/A 09/16/2017   Procedure: RIGHT/LEFT HEART CATH AND CORONARY ANGIOGRAPHY;  Surgeon: Martinique, Peter M, MD;  Location: [facility];  Service: Cardiovascular;  Laterality: N/A;  . SPINE SURGERY      Family History  Problem Relation Age of Onset  . Stroke Mother   . Kidney disease Father   . Stroke Father   . Hypertension Father   . Early death Father   . Cancer Neg Hx   .  Alcohol abuse Neg Hx   . Diabetes Neg Hx   . Heart disease Neg Hx   . Hyperlipidemia Neg Hx     Social History   Socioeconomic History  . Marital status: Single    Spouse name: Not on file  . Number of children: Not on file  . Years of education: Not on file  . Highest education level: Not on file  Occupational History  . Not on file  Tobacco Use  . Smoking status: Never Smoker  . Smokeless tobacco: Never Used  Substance and Sexual Activity  . Alcohol use: No  . Drug use: No  . Sexual activity: Not Currently  Other Topics Concern  . Not on file  Social History Narrative  . Not on file   Social Determinants of Health   Financial Resource Strain:   . Difficulty of Paying Living Expenses: Not on file  Food Insecurity:   . Worried About Charity fundraiser in the  Last Year: Not on file  . Ran Out of Food in the Last Year: Not on file  Transportation Needs:   . Lack of Transportation (Medical): Not on file  . Lack of Transportation (Non-Medical): Not on file  Physical Activity:   . Days of Exercise per Week: Not on file  . Minutes of Exercise per Session: Not on file  Stress:   . Feeling of Stress : Not on file  Social Connections:   . Frequency of Communication with Friends and Family: Not on file  . Frequency of Social Gatherings with Friends and Family: Not on file  . Attends Religious Services: Not on file  . Active Member of Clubs or Organizations: Not on file  . Attends Archivist Meetings: Not on file  . Marital Status: Not on file  Intimate Partner Violence:   . Fear of Current or Ex-Partner: Not on file  . Emotionally Abused: Not on file  . Physically Abused: Not on file  . Sexually Abused: Not on file    Outpatient Medications Prior to Visit  Medication Sig Dispense Refill  . aspirin EC 81 MG tablet Take 1 tablet (81 mg total) by mouth daily. 90 tablet 3  . carvedilol (COREG) 6.25 MG tablet TAKE 1 AND 1/2 TABLETS BY MOUTH TWICE DAILY 90 tablet 0  . cholecalciferol (VITAMIN D) 1000 units tablet Take 1,000 Units by mouth daily.    Marland Kitchen esomeprazole (NEXIUM) 40 MG capsule Take 1 capsule (40 mg total) by mouth as needed (for heartburn). 30 capsule 5  . rosuvastatin (CRESTOR) 5 MG tablet TAKE 1 TABLET BY MOUTH DAILY 90 tablet 3  . sacubitril-valsartan (ENTRESTO) 24-26 MG Take 1 tablet by mouth 2 (two) times daily. 180 tablet 3  . olopatadine (PATANOL) 0.1 % ophthalmic solution Place 1 drop into both eyes as needed for allergies. 5 mL 2  . furosemide (LASIX) 40 MG tablet Take 0.5 tablets (20 mg total) by mouth 3 (three) times a week. (Patient not taking: Reported on 10/12/2019) 30 tablet 6   No facility-administered medications prior to visit.    Allergies  Allergen Reactions  . Amoxicillin Hives  . Bactrim  [Sulfamethoxazole-Trimethoprim] Other (See Comments)    Dried out his eyes  . Cephalexin Itching  . Hydrocodone Itching  . Lexapro [Escitalopram Oxalate] Other (See Comments)    disorientation  . Lipitor [Atorvastatin] Other (See Comments)    Damage to calf muscles  . Oxycodone Itching  . Statins Other (See Comments)  Muscle pain   . Vytorin [Ezetimibe-Simvastatin] Other (See Comments)    Damage to calf muscles    Review of Systems  Constitutional: Negative.   HENT: Positive for mouth sores. Negative for congestion, dental problem, drooling, ear discharge, ear pain, facial swelling, hearing loss, nosebleeds, postnasal drip, rhinorrhea, sinus pressure, sinus pain, sneezing, sore throat, tinnitus, trouble swallowing and voice change.   Eyes: Negative.   Respiratory: Negative.  Negative for shortness of breath.   Cardiovascular: Negative.  Negative for chest pain.  Gastrointestinal: Negative.   Endocrine: Negative.   Genitourinary: Negative.   Musculoskeletal: Negative.   Skin: Negative.   Allergic/Immunologic: Negative.   Neurological: Negative.   Hematological: Negative.   Psychiatric/Behavioral: Negative.   All other systems reviewed and are negative.      Objective:    Physical Exam Vitals and nursing note reviewed.  Constitutional:      General: He is not in acute distress.    Appearance: Normal appearance. He is normal weight. He is not ill-appearing, toxic-appearing or diaphoretic.  HENT:     Mouth/Throat:     Lips: Pink. No lesions.     Mouth: Mucous membranes are moist. Oral lesions (outside gums near upper left molars and upper right molars. Red, swollen, ttp. no drainage.) present. No injury.     Dentition: Abnormal dentition. Dental caries and gum lesions present. No dental tenderness.     Tongue: No lesions.     Palate: No mass.     Pharynx: Oropharynx is clear. Uvula midline.     Tonsils: No tonsillar exudate.  Cardiovascular:     Rate and Rhythm:  Normal rate and regular rhythm.  Pulmonary:     Effort: Pulmonary effort is normal. No respiratory distress.  Neurological:     General: No focal deficit present.     Mental Status: He is alert and oriented to person, place, and time. Mental status is at baseline.  Psychiatric:        Mood and Affect: Mood normal.        Behavior: Behavior normal.        Thought Content: Thought content normal.        Judgment: Judgment normal.     BP 120/77   Pulse 71   Temp 98.1 F (36.7 C) (Temporal)   Ht _0  (1.676 m)   Wt 172 lb 3.2 oz (78.1 kg)   SpO2 96%   BMI 27.79 kg/m  Wt Readings from Last 3 Encounters:  10/26/19 172 lb 3.2 oz (78.1 kg)  10/12/19 172 lb (78 kg)  09/14/19 172 lb 1.9 oz (78.1 kg)    Health Maintenance Due  Topic Date Due  . OPHTHALMOLOGY EXAM  06/12/1951  . FOOT EXAM  07/08/2018  . URINE MICROALBUMIN  01/07/2019  . HEMOGLOBIN A1C  04/07/2019    There are no preventive care reminders to display for this patient.   Lab Results  Component Value Date   TSH 1.22 07/08/2017   Lab Results  Component Value Date   WBC 6.2 10/06/2018   HGB 14.9 10/06/2018   HCT 43.0 10/06/2018   MCV 87 10/06/2018   PLT 214 10/06/2018   Lab Results  Component Value Date   NA 140 09/14/2019   K 4.6 09/14/2019   CO2 19 (L) 09/14/2019   GLUCOSE 95 09/14/2019   BUN 19 09/14/2019   CREATININE 1.03 09/14/2019   BILITOT 1.0 09/14/2019   ALKPHOS 51 09/14/2019   AST 18 09/14/2019   ALT  18 09/14/2019   PROT 6.4 09/14/2019   ALBUMIN 4.5 09/14/2019   CALCIUM 8.8 09/14/2019   ANIONGAP 8 09/17/2017   GFR 83.56 03/29/2018   Lab Results  Component Value Date   CHOL 135 09/14/2019   Lab Results  Component Value Date   HDL 46 09/14/2019   Lab Results  Component Value Date   LDLCALC 66 09/14/2019   Lab Results  Component Value Date   TRIG 127 09/14/2019   Lab Results  Component Value Date   CHOLHDL 2.9 09/14/2019   Lab Results  Component Value Date   HGBA1C 6.3  (H) 10/06/2018       Assessment & Plan:   Problem List Items Addressed This Visit    None    Visit Diagnoses    Gingivitis    -  Primary   Relevant Medications   doxycycline (VIBRA-TABS) 100 MG tablet   Allergic conjunctivitis of both eyes       Relevant Medications   olopatadine (PATANOL) 0.1 % ophthalmic solution       Meds ordered this encounter  Medications  . olopatadine (PATANOL) 0.1 % ophthalmic solution    Sig: Place 1 drop into both eyes as needed for allergies.    Dispense:  5 mL    Refill:  2    Order Specific Question:   Supervising Provider    Answer:   Delia Chimes A [id number]  . doxycycline (VIBRA-TABS) 100 MG tablet    Sig: Take 1 tablet (100 mg total) by mouth 2 (two) times daily.    Dispense:  30 tablet    Refill:  1    Order Specific Question:   Supervising Provider    Answer:   Forrest Moron [id number]   PLAN  Will refill doxycycline 154m PO bid for 15 days with a refill  Will refill olopatadine  Discussed that dental infections may be serious, especially in patients with CV concerns.  Suggest he get dental work done as soon as convenient  Patient encouraged to call clinic with any questions, comments, or concerns.   RMaximiano Coss NP

## 2019-10-26 NOTE — Patient Instructions (Signed)
° ° ° °  If you have lab work done today you will be contacted with your lab results within the next 2 weeks.  If you have not heard from us then please contact us. The fastest way to get your results is to register for My Chart. ° ° °IF you received an x-ray today, you will receive an invoice from [facility]. Please contact [name] at [phone] with questions or concerns regarding your invoice.  ° °IF you received labwork today, you will receive an invoice from [facility]. Please contact [facility] at [phone] with questions or concerns regarding your invoice.  ° °Our billing staff will not be able to assist you with questions regarding bills from these companies. ° °You will be contacted with the lab results as soon as they are available. The fastest way to get your results is to activate your My Chart account. Instructions are located on the last page of this paperwork. If you have not heard from us regarding the results in 2 weeks, please contact this office. °  ° ° ° °

## 2019-10-27 NOTE — Telephone Encounter (Signed)
**Note De-Identified  Obfuscation** Letter received from [employer] stating that they have approved the pt for asst with his Entresto. Approval good for the remainder 2021. Pt Id: [id number]  The letter states that they have notified the pt of this approval as well.

## 2019-11-02 ENCOUNTER — Other Ambulatory Visit: Payer: Self-pay | Admitting: Physician Assistant

## 2019-11-03 ENCOUNTER — Telehealth: Payer: Self-pay | Admitting: Cardiovascular Disease

## 2019-11-03 NOTE — Telephone Encounter (Signed)
Called pt's pharmacy to see how much it would cost for pt to get his medication Entresto and the pharmacist stated that the pt only have to pay $47. I called the pt to inform him of this and pt stated that he would pick it up tomorrow from the pharmacy. I advised the pt that if he has any other problems, questions or concerns, to please give our office a call back. Pt verbalized understanding.

## 2019-11-03 NOTE — Telephone Encounter (Signed)
Pt is calling to let Dr. Acie Fredrickson and his nurse know that he applied through [employer] pt assistance program for [facility], back in early Dec, and still has not heard anything back and will run completely out of this medication by the weekend. Pt is asking what he should do about this, or if we have any samples to get him through.  Informed the pt that I will route this message to our [facility] to see where we are at with his pt assistance application , and to our refill dept to assist with samples or month free co-pay card. Informed the pt that someone from the office will follow-up with him shortly, to assist him in getting this medication until his pt assistance application has been finalized. Pt verbalized understanding and agrees with this plan.

## 2019-11-03 NOTE — Telephone Encounter (Signed)
Patient states that he is signing up for [employer] Patient Assistance program for the medication sacubitril-valsartan (ENTRESTO) 24-26 MG. He was requesting to speak with Richardson Dopp in regards to this medication because he is starting to run out.

## 2020-02-24 ENCOUNTER — Other Ambulatory Visit: Payer: Self-pay

## 2020-02-24 ENCOUNTER — Ambulatory Visit (INDEPENDENT_AMBULATORY_CARE_PROVIDER_SITE_OTHER): Payer: Medicare Other | Admitting: Registered Nurse

## 2020-02-24 ENCOUNTER — Ambulatory Visit (INDEPENDENT_AMBULATORY_CARE_PROVIDER_SITE_OTHER): Payer: Medicare Other

## 2020-02-24 ENCOUNTER — Encounter: Payer: Self-pay | Admitting: Registered Nurse

## 2020-02-24 VITALS — BP 130/82 | HR 74 | Temp 97.9°F | Ht 66.0 in | Wt 172.2 lb

## 2020-02-24 DIAGNOSIS — Z130 Encounter for screening for diseases of the blood and blood-forming organs and certain disorders involving the immune mechanism: Secondary | ICD-10-CM | POA: Diagnosis not present

## 2020-02-24 DIAGNOSIS — F05 Delirium due to known physiological condition: Secondary | ICD-10-CM

## 2020-02-24 DIAGNOSIS — I10 Essential (primary) hypertension: Secondary | ICD-10-CM | POA: Diagnosis not present

## 2020-02-24 DIAGNOSIS — E118 Type 2 diabetes mellitus with unspecified complications: Secondary | ICD-10-CM

## 2020-02-24 DIAGNOSIS — R9431 Abnormal electrocardiogram [ECG] [EKG]: Secondary | ICD-10-CM | POA: Diagnosis not present

## 2020-02-24 DIAGNOSIS — J9811 Atelectasis: Secondary | ICD-10-CM | POA: Diagnosis not present

## 2020-02-24 DIAGNOSIS — R519 Headache, unspecified: Secondary | ICD-10-CM

## 2020-02-24 DIAGNOSIS — Z13220 Encounter for screening for lipoid disorders: Secondary | ICD-10-CM | POA: Diagnosis not present

## 2020-02-24 DIAGNOSIS — Z1329 Encounter for screening for other suspected endocrine disorder: Secondary | ICD-10-CM | POA: Diagnosis not present

## 2020-02-24 DIAGNOSIS — Z13228 Encounter for screening for other metabolic disorders: Secondary | ICD-10-CM

## 2020-02-24 DIAGNOSIS — I5022 Chronic systolic (congestive) heart failure: Secondary | ICD-10-CM

## 2020-02-24 LAB — POCT GLYCOSYLATED HEMOGLOBIN (HGB A1C): Hemoglobin A1C: 6.4 % — AB (ref 4.0–5.6)

## 2020-02-24 NOTE — Progress Notes (Signed)
Acute Office Visit  Subjective:    Patient ID: Christian [name], male    DOB: 1940-12-14, 79 y.o.   MRN: [id number]  Chief Complaint  Patient presents with  . Dizziness    patient states for the last five day he has been feeling foggy when he wakes up but maybe its because he has felt dizzy at nights.    HPI Patient is in today for feeling "foggy"  Reports that he has often felt a mental fog at nights, which he has attributed to fatigue in the past. However, in the past 4-5 days, he has felt this in the mornings as well. At it's worst, he notes that he was coming home from an appt and had an acute episode of disorientation and mental status change. It took him some time, but he was able to reorient and relax.   He has not had LOC, no changes to diet, no changes to medication.  No blood in stool or melena. No abdominal pain. No nvd. No chest pain, shob, doe, cough, congestion. There is a slight visual change when the episodes of fogginess or dizziness arise, but these resolve as these episodes resolve. He also notes an occipital headache that has started within the past week. It is mild and throbbing. It waxes and wanes. It does seem to be associated with the episodes of dizziness.   He has a hx of CHF managed by Dr. Acie Fredrickson. Reports he was seen recently - no note found. It does appear he is scheduled for an echo.   Has a hx of T2DM with good control - POCT A1c in office today 6.4 - 1 year ago was 6.3. Does not routinely check glucose.   Past Medical History:  Diagnosis Date  . Allergic rhinitis due to pollen 03/29/2018  . Chronic systolic heart failure (HCC) - EF improved to normal on Rx    Echo 11/18: Mild LVH, EF 20, diffuse HK mild AI, a sending aorta 42 mm (mildly dilated), mild MR, mild TR, trivial PI, PASP 44 // Echo 3/19: mild LVH, EF 25-30, diff HK, Gr 1 DD, asc aorta 43 mm (mildly dilated), MAC, mild reduced RVSF // Limited echo 10/19: Mild concentric LVH, EF 60-65, grade 1  diastolic dysfunction, MAC, mild LAE   . Coronary artery disease involving native coronary artery of native heart without angina pectoris 11/11/2017   R/L HC 11/18: pLAD 45, oD1 65 ; elevated PCWP (mean 29), moderate pulmonary hypertension, CO 3.66, CI 2.07  . Depression with anxiety 07/08/2017  . Essential hypertension 07/08/2017  . GERD with esophagitis 01/11/2018  . Hernia of abdominal cavity   . Hyperlipidemia   . Hyperlipidemia LDL goal <130 05/25/2017   The 10-year ASCVD risk score Mikey Bussing DC Jr., et al., 2013) is: 31.1%   Values used to calculate the score:     Age: 26 years     Sex: Male     Is Non-Hispanic African American: No     Diabetic: No     Tobacco smoker: No     Systolic Blood Pressure: 885 mmHg     Is BP treated: No     HDL Cholesterol: 68.7 mg/dL     Total Cholesterol: 258 mg/dL   . Idiopathic inflammatory myopathy 05/25/2017  . Neuropathy involving both lower extremities 05/25/2017  . Nonischemic cardiomyopathy ([place]) 11/11/2017   LHC 09/16/17-nonobstructive CAD  . Oropharyngeal dysphagia 01/11/2018  . Type 2 diabetes mellitus with complication, without long-term current use of insulin (  [place]) 07/08/2017    Past Surgical History:  Procedure Laterality Date  . BACK SURGERY    . HERNIA REPAIR    . RIGHT/LEFT HEART CATH AND CORONARY ANGIOGRAPHY N/A 09/16/2017   Procedure: RIGHT/LEFT HEART CATH AND CORONARY ANGIOGRAPHY;  Surgeon: Martinique, Peter M, MD;  Location: [facility];  Service: Cardiovascular;  Laterality: N/A;  . SPINE SURGERY      Family History  Problem Relation Age of Onset  . Stroke Mother   . Kidney disease Father   . Stroke Father   . Hypertension Father   . Early death Father   . Cancer Neg Hx   . Alcohol abuse Neg Hx   . Diabetes Neg Hx   . Heart disease Neg Hx   . Hyperlipidemia Neg Hx     Social History   Socioeconomic History  . Marital status: Single    Spouse name: Not on file  . Number of children: Not on file  . Years of education: Not on  file  . Highest education level: Not on file  Occupational History  . Not on file  Tobacco Use  . Smoking status: Never Smoker  . Smokeless tobacco: Never Used  Substance and Sexual Activity  . Alcohol use: No  . Drug use: No  . Sexual activity: Not Currently  Other Topics Concern  . Not on file  Social History Narrative  . Not on file   Social Determinants of Health   Financial Resource Strain:   . Difficulty of Paying Living Expenses:   Food Insecurity:   . Worried About Charity fundraiser in the Last Year:   . Arboriculturist in the Last Year:   Transportation Needs:   . Film/video editor (Medical):   Marland Kitchen Lack of Transportation (Non-Medical):   Physical Activity:   . Days of Exercise per Week:   . Minutes of Exercise per Session:   Stress:   . Feeling of Stress :   Social Connections:   . Frequency of Communication with Friends and Family:   . Frequency of Social Gatherings with Friends and Family:   . Attends Religious Services:   . Active Member of Clubs or Organizations:   . Attends Archivist Meetings:   Marland Kitchen Marital Status:   Intimate Partner Violence:   . Fear of Current or Ex-Partner:   . Emotionally Abused:   Marland Kitchen Physically Abused:   . Sexually Abused:     Outpatient Medications Prior to Visit  Medication Sig Dispense Refill  . aspirin EC 81 MG tablet Take 1 tablet (81 mg total) by mouth daily. 90 tablet 3  . carvedilol (COREG) 6.25 MG tablet TAKE 1 AND 1/2 TABLETS BY MOUTH TWICE DAILY 90 tablet 6  . cholecalciferol (VITAMIN D) 1000 units tablet Take 1,000 Units by mouth daily.    Marland Kitchen doxycycline (VIBRA-TABS) 100 MG tablet Take 1 tablet (100 mg total) by mouth 2 (two) times daily. 30 tablet 1  . esomeprazole (NEXIUM) 40 MG capsule Take 1 capsule (40 mg total) by mouth as needed (for heartburn). 30 capsule 5  . olopatadine (PATANOL) 0.1 % ophthalmic solution Place 1 drop into both eyes as needed for allergies. 5 mL 2  . rosuvastatin (CRESTOR) 5 MG  tablet TAKE 1 TABLET BY MOUTH DAILY 90 tablet 3  . sacubitril-valsartan (ENTRESTO) 24-26 MG Take 1 tablet by mouth 2 (two) times daily. 180 tablet 3  . furosemide (LASIX) 40 MG tablet Take 0.5 tablets (  20 mg total) by mouth 3 (three) times a week. (Patient not taking: Reported on 10/12/2019) 30 tablet 6   No facility-administered medications prior to visit.    Allergies  Allergen Reactions  . Amoxicillin Hives  . Bactrim [Sulfamethoxazole-Trimethoprim] Other (See Comments)    Dried out his eyes  . Cephalexin Itching  . Hydrocodone Itching  . Lexapro [Escitalopram Oxalate] Other (See Comments)    disorientation  . Lipitor [Atorvastatin] Other (See Comments)    Damage to calf muscles  . Oxycodone Itching  . Statins Other (See Comments)    Muscle pain   . Vytorin [Ezetimibe-Simvastatin] Other (See Comments)    Damage to calf muscles    Review of Systems  Constitutional: Negative.   HENT: Negative.   Eyes: Negative.   Respiratory: Negative.   Cardiovascular: Negative.   Gastrointestinal: Negative.   Endocrine: Negative.   Genitourinary: Negative.   Musculoskeletal: Negative.   Skin: Negative.   Allergic/Immunologic: Negative.   Neurological: Positive for dizziness and headaches. Negative for tremors, seizures, syncope, facial asymmetry, speech difficulty, weakness, light-headedness and numbness.  Hematological: Negative.   Psychiatric/Behavioral: Negative.        Objective:    Physical Exam Vitals and nursing note reviewed.  Constitutional:      General: He is not in acute distress.    Appearance: Normal appearance. He is normal weight. He is not ill-appearing, toxic-appearing or diaphoretic.  Neck:     Vascular: No carotid bruit.  Cardiovascular:     Rate and Rhythm: Normal rate and regular rhythm.     Pulses: Normal pulses.     Heart sounds: Normal heart sounds. No murmur. No friction rub. No gallop.   Pulmonary:     Effort: Pulmonary effort is normal. No  respiratory distress.     Breath sounds: Normal breath sounds. No stridor. No wheezing, rhonchi or rales.  Chest:     Chest wall: No tenderness.  Musculoskeletal:        General: No swelling, tenderness, deformity or signs of injury. Normal range of motion.     Cervical back: Normal range of motion and neck supple. No rigidity or tenderness.     Right lower leg: No edema.     Left lower leg: No edema.  Lymphadenopathy:     Cervical: No cervical adenopathy.  Skin:    General: Skin is warm and dry.     Capillary Refill: Capillary refill takes 2 to 3 seconds.     Coloration: Skin is not jaundiced or pale.     Findings: No bruising, erythema, lesion or rash.  Neurological:     General: No focal deficit present.     Mental Status: He is alert and oriented to person, place, and time. Mental status is at baseline.     Cranial Nerves: Cranial nerves are intact. No cranial nerve deficit.     Sensory: Sensation is intact. No sensory deficit.     Motor: Motor function is intact. No weakness or pronator drift.     Coordination: Coordination is intact. Romberg sign negative. Coordination normal. Finger-Nose-Finger Test normal.     Gait: Gait is intact. Gait and tandem walk normal.     Deep Tendon Reflexes: Reflexes normal.  Psychiatric:        Mood and Affect: Mood normal.        Thought Content: Thought content normal.        Judgment: Judgment normal.     BP 130/82   Pulse 74  Temp 97.9 F (36.6 C) (Temporal)   Ht _0  (1.676 m)   Wt 172 lb 3.2 oz (78.1 kg)   SpO2 94%   BMI 27.79 kg/m  Wt Readings from Last 3 Encounters:  02/24/20 172 lb 3.2 oz (78.1 kg)  10/26/19 172 lb 3.2 oz (78.1 kg)  10/12/19 172 lb (78 kg)    Health Maintenance Due  Topic Date Due  . URINE MICROALBUMIN  01/07/2019    There are no preventive care reminders to display for this patient.   Lab Results  Component Value Date   TSH 1.22 07/08/2017   Lab Results  Component Value Date   WBC 6.2  10/06/2018   HGB 14.9 10/06/2018   HCT 43.0 10/06/2018   MCV 87 10/06/2018   PLT 214 10/06/2018   Lab Results  Component Value Date   NA 140 09/14/2019   K 4.6 09/14/2019   CO2 19 (L) 09/14/2019   GLUCOSE 95 09/14/2019   BUN 19 09/14/2019   CREATININE 1.03 09/14/2019   BILITOT 1.0 09/14/2019   ALKPHOS 51 09/14/2019   AST 18 09/14/2019   ALT 18 09/14/2019   PROT 6.4 09/14/2019   ALBUMIN 4.5 09/14/2019   CALCIUM 8.8 09/14/2019   ANIONGAP 8 09/17/2017   GFR 83.56 03/29/2018   Lab Results  Component Value Date   CHOL 135 09/14/2019   Lab Results  Component Value Date   HDL 46 09/14/2019   Lab Results  Component Value Date   LDLCALC 66 09/14/2019   Lab Results  Component Value Date   TRIG 127 09/14/2019   Lab Results  Component Value Date   CHOLHDL 2.9 09/14/2019   Lab Results  Component Value Date   HGBA1C 6.4 (A) 02/24/2020       Assessment & Plan:   Problem List Items Addressed This Visit      Cardiovascular and Mediastinum   Essential hypertension (Chronic)   Relevant Orders   Comprehensive metabolic panel   CBC with Differential   TSH   Chronic systolic heart failure (HCC)   Relevant Orders   EKG 12-Lead (Completed)     Endocrine   Type 2 diabetes mellitus with complication, without long-term current use of insulin (HCC) (Chronic)   Relevant Orders   Microalbumin, urine   POCT glycosylated hemoglobin (Hb A1C) (Completed)    Other Visit Diagnoses    Screening for endocrine, metabolic and immunity disorder    -  Primary   Relevant Orders   Comprehensive metabolic panel   CBC with Differential   TSH   Lipid screening       Relevant Orders   Lipid panel   Acute confusional state       Relevant Orders   POCT URINALYSIS DIP (CLINITEK)   Glucose (CBG)   EKG 12-Lead (Completed)   DG Chest 2 View (Completed)   MR Brain W Wo Contrast   Ambulatory referral to Neurology   Abnormal EKG       Relevant Orders   DG Chest 2 View (Completed)    Acute nonintractable headache, unspecified headache type       Relevant Orders   MR Brain W Wo Contrast       No orders of the defined types were placed in this encounter.  PLAN  Unremarkable exam  EKG relatively unchanged from previous studies - no acute findings  CXR shows LLL atelectasis but exam unremarkable  Neuro exam unremarkable  No clear etiology. Concern for TIA / small  vessel disease. Will order MRI and refer to neuro.   Will contact pt Cardiologist Dr. Acie Fredrickson and discuss possible echo or stress test for further cardiac work up  Labs collected, will follow up as warranted  Strict ER precautions given, pt demonstrates understanding  Patient encouraged to call clinic with any questions, comments, or concerns.   Maximiano Coss, NP

## 2020-02-24 NOTE — Patient Instructions (Signed)
° ° ° °  If you have lab work done today you will be contacted with your lab results within the next 2 weeks.  If you have not heard from us then please contact us. The fastest way to get your results is to register for My Chart. ° ° °IF you received an x-ray today, you will receive an invoice from [facility]. Please contact  Radiology at [phone] with questions or concerns regarding your invoice.  ° °IF you received labwork today, you will receive an invoice from [facility]. Please contact [facility] at [phone] with questions or concerns regarding your invoice.  ° °Our billing staff will not be able to assist you with questions regarding bills from these companies. ° °You will be contacted with the lab results as soon as they are available. The fastest way to get your results is to activate your My Chart account. Instructions are located on the last page of this paperwork. If you have not heard from us regarding the results in 2 weeks, please contact this office. °  ° ° ° °

## 2020-02-25 ENCOUNTER — Inpatient Hospital Stay (HOSPITAL_COMMUNITY)
Admission: EM | Admit: 2020-02-25 | Discharge: 2020-03-01 | DRG: 064 | Disposition: A | Discharge status: Rehabilitation Facility | Payer: Medicare Other | Attending: Family Medicine | Admitting: Family Medicine

## 2020-02-25 ENCOUNTER — Emergency Department (HOSPITAL_COMMUNITY): Payer: Medicare Other

## 2020-02-25 ENCOUNTER — Encounter (HOSPITAL_COMMUNITY): Payer: Self-pay

## 2020-02-25 ENCOUNTER — Other Ambulatory Visit: Payer: Self-pay

## 2020-02-25 DIAGNOSIS — Z8249 Family history of ischemic heart disease and other diseases of the circulatory system: Secondary | ICD-10-CM | POA: Diagnosis not present

## 2020-02-25 DIAGNOSIS — I428 Other cardiomyopathies: Secondary | ICD-10-CM | POA: Diagnosis present

## 2020-02-25 DIAGNOSIS — G5793 Unspecified mononeuropathy of bilateral lower limbs: Secondary | ICD-10-CM | POA: Diagnosis not present

## 2020-02-25 DIAGNOSIS — Z881 Allergy status to other antibiotic agents status: Secondary | ICD-10-CM | POA: Diagnosis not present

## 2020-02-25 DIAGNOSIS — I5022 Chronic systolic (congestive) heart failure: Secondary | ICD-10-CM | POA: Diagnosis present

## 2020-02-25 DIAGNOSIS — H53462 Homonymous bilateral field defects, left side: Secondary | ICD-10-CM | POA: Diagnosis not present

## 2020-02-25 DIAGNOSIS — R29702 NIHSS score 2: Secondary | ICD-10-CM | POA: Diagnosis present

## 2020-02-25 DIAGNOSIS — R0902 Hypoxemia: Secondary | ICD-10-CM | POA: Diagnosis not present

## 2020-02-25 DIAGNOSIS — Z885 Allergy status to narcotic agent status: Secondary | ICD-10-CM

## 2020-02-25 DIAGNOSIS — H547 Unspecified visual loss: Secondary | ICD-10-CM | POA: Diagnosis present

## 2020-02-25 DIAGNOSIS — K469 Unspecified abdominal hernia without obstruction or gangrene: Secondary | ICD-10-CM | POA: Diagnosis not present

## 2020-02-25 DIAGNOSIS — R42 Dizziness and giddiness: Secondary | ICD-10-CM | POA: Diagnosis not present

## 2020-02-25 DIAGNOSIS — I6389 Other cerebral infarction: Secondary | ICD-10-CM | POA: Diagnosis not present

## 2020-02-25 DIAGNOSIS — Z888 Allergy status to other drugs, medicaments and biological substances status: Secondary | ICD-10-CM

## 2020-02-25 DIAGNOSIS — I639 Cerebral infarction, unspecified: Secondary | ICD-10-CM

## 2020-02-25 DIAGNOSIS — J301 Allergic rhinitis due to pollen: Secondary | ICD-10-CM | POA: Diagnosis not present

## 2020-02-25 DIAGNOSIS — I110 Hypertensive heart disease with heart failure: Secondary | ICD-10-CM | POA: Diagnosis not present

## 2020-02-25 DIAGNOSIS — I611 Nontraumatic intracerebral hemorrhage in hemisphere, cortical: Secondary | ICD-10-CM | POA: Diagnosis present

## 2020-02-25 DIAGNOSIS — Z79899 Other long term (current) drug therapy: Secondary | ICD-10-CM | POA: Diagnosis not present

## 2020-02-25 DIAGNOSIS — E785 Hyperlipidemia, unspecified: Secondary | ICD-10-CM | POA: Diagnosis present

## 2020-02-25 DIAGNOSIS — I2510 Atherosclerotic heart disease of native coronary artery without angina pectoris: Secondary | ICD-10-CM | POA: Diagnosis not present

## 2020-02-25 DIAGNOSIS — Z20822 Contact with and (suspected) exposure to covid-19: Secondary | ICD-10-CM | POA: Diagnosis present

## 2020-02-25 DIAGNOSIS — Z7982 Long term (current) use of aspirin: Secondary | ICD-10-CM | POA: Diagnosis not present

## 2020-02-25 DIAGNOSIS — K219 Gastro-esophageal reflux disease without esophagitis: Secondary | ICD-10-CM | POA: Diagnosis not present

## 2020-02-25 DIAGNOSIS — I63531 Cerebral infarction due to unspecified occlusion or stenosis of right posterior cerebral artery: Secondary | ICD-10-CM | POA: Diagnosis not present

## 2020-02-25 DIAGNOSIS — F418 Other specified anxiety disorders: Secondary | ICD-10-CM | POA: Diagnosis present

## 2020-02-25 DIAGNOSIS — Z823 Family history of stroke: Secondary | ICD-10-CM

## 2020-02-25 DIAGNOSIS — Z03818 Encounter for observation for suspected exposure to other biological agents ruled out: Secondary | ICD-10-CM | POA: Diagnosis not present

## 2020-02-25 DIAGNOSIS — Z743 Need for continuous supervision: Secondary | ICD-10-CM | POA: Diagnosis not present

## 2020-02-25 DIAGNOSIS — R7303 Prediabetes: Secondary | ICD-10-CM | POA: Diagnosis present

## 2020-02-25 DIAGNOSIS — R262 Difficulty in walking, not elsewhere classified: Secondary | ICD-10-CM | POA: Diagnosis not present

## 2020-02-25 DIAGNOSIS — I10 Essential (primary) hypertension: Secondary | ICD-10-CM | POA: Diagnosis not present

## 2020-02-25 LAB — COMPREHENSIVE METABOLIC PANEL
ALT: 13 IU/L (ref 0–44)
AST: 16 IU/L (ref 0–40)
Albumin/Globulin Ratio: 2.1 (ref 1.2–2.2)
Albumin: 4.4 g/dL (ref 3.7–4.7)
Alkaline Phosphatase: 50 IU/L (ref 39–117)
BUN/Creatinine Ratio: 18 (ref 10–24)
BUN: 17 mg/dL (ref 8–27)
Bilirubin Total: 0.9 mg/dL (ref 0.0–1.2)
CO2: 22 mmol/L (ref 20–29)
Calcium: 9.1 mg/dL (ref 8.6–10.2)
Chloride: 105 mmol/L (ref 96–106)
Creatinine, Ser: 0.94 mg/dL (ref 0.76–1.27)
GFR calc Af Amer: 89 mL/min/{1.73_m2} (ref >59)
GFR calc non Af Amer: 77 mL/min/{1.73_m2} (ref >59)
Globulin, Total: 2.1 g/dL (ref 1.5–4.5)
Glucose: 116 mg/dL — ABNORMAL HIGH (ref 65–99)
Potassium: 4.4 mmol/L (ref 3.5–5.2)
Sodium: 141 mmol/L (ref 134–144)
Total Protein: 6.5 g/dL (ref 6.0–8.5)

## 2020-02-25 LAB — CBC WITH DIFFERENTIAL/PLATELET
Basophils Absolute: 0 10*3/uL (ref 0.0–0.2)
Basos: 1 %
EOS (ABSOLUTE): 0.3 10*3/uL (ref 0.0–0.4)
Eos: 4 %
Hematocrit: 46.1 % (ref 37.5–51.0)
Hemoglobin: 15.4 g/dL (ref 13.0–17.7)
Immature Grans (Abs): 0.1 10*3/uL (ref 0.0–0.1)
Immature Granulocytes: 1 %
Lymphocytes Absolute: 1.5 10*3/uL (ref 0.7–3.1)
Lymphs: 23 %
MCH: 29.7 pg (ref 26.6–33.0)
MCHC: 33.4 g/dL (ref 31.5–35.7)
MCV: 89 fL (ref 79–97)
Monocytes Absolute: 0.7 10*3/uL (ref 0.1–0.9)
Monocytes: 11 %
Neutrophils Absolute: 3.8 10*3/uL (ref 1.4–7.0)
Neutrophils: 60 %
Platelets: 202 10*3/uL (ref 150–450)
RBC: 5.19 x10E6/uL (ref 4.14–5.80)
RDW: 13.3 % (ref 11.6–15.4)
WBC: 6.3 10*3/uL (ref 3.4–10.8)

## 2020-02-25 LAB — LIPID PANEL
Chol/HDL Ratio: 3 ratio (ref 0.0–5.0)
Cholesterol, Total: 142 mg/dL (ref 100–199)
HDL: 47 mg/dL (ref >39)
LDL Chol Calc (NIH): 74 mg/dL (ref 0–99)
Triglycerides: 114 mg/dL (ref 0–149)
VLDL Cholesterol Cal: 21 mg/dL (ref 5–40)

## 2020-02-25 LAB — TSH: TSH: 1.65 u[IU]/mL (ref 0.450–4.500)

## 2020-02-25 LAB — MICROALBUMIN, URINE

## 2020-02-25 MED ORDER — GADOBUTROL 1 MMOL/ML IV SOLN
7.5000 mL | Freq: Once | INTRAVENOUS | Status: AC | PRN
  Administered 2020-02-25: 7.5 mL via INTRAVENOUS

## 2020-02-25 MED ORDER — LORAZEPAM 2 MG/ML IJ SOLN
1.0000 mg | Freq: Once | INTRAMUSCULAR | Status: AC
  Administered 2020-02-25: 1 mg via INTRAVENOUS
  Filled 2020-02-25: qty 1

## 2020-02-25 NOTE — ED Provider Notes (Signed)
[facility] EMERGENCY DEPARTMENT Provider Note   CSN: [id number] Arrival date & time: 02/25/20  1906     History Chief Complaint  Patient presents with  . Dizziness    Christian [name] is a 79 y.o. male with a past medical history of CHF, CAD, GERD, hypertension, hyperlipidemia not currently on anticoagulant medications presenting to the ED with a chief complaint of dizziness.  Patient admits for the past several months he has had intermittent "numbness and trouble walking" in bilateral legs.  He also reports "fogginess" in his brain for the past several months.  Typically his numbness in his legs will improve with movement, ambulation and "putting a towel underneath my thighs."  He remains ambulatory without assistance.  For the past week he has noticed he has had confusion, "not even able to recognize stores in this place that I have lived for 37 years."  Today noticed that he continued to have persistent numbness in his legs despite taking the above mentioned measures that usually improve it.  He spoke to his cousin who is a physician and was told to come to the ER.  He did tell his PCP about these problems yesterday and is scheduled for an outpatient MRI. He denies any headache, vision changes, numbness in arms, injuries or falls, chest pain, shortness of breath or history of CVA or TIA in the past.  HPI     Past Medical History:  Diagnosis Date  . Allergic rhinitis due to pollen 03/29/2018  . Chronic systolic heart failure (HCC) - EF improved to normal on Rx    Echo 11/18: Mild LVH, EF 20, diffuse HK mild AI, a sending aorta 42 mm (mildly dilated), mild MR, mild TR, trivial PI, PASP 44 // Echo 3/19: mild LVH, EF 25-30, diff HK, Gr 1 DD, asc aorta 43 mm (mildly dilated), MAC, mild reduced RVSF // Limited echo 10/19: Mild concentric LVH, EF 37-90, grade 1 diastolic dysfunction, MAC, mild LAE   . Coronary artery disease involving native coronary artery of native heart without  angina pectoris 11/11/2017   R/L HC 11/18: pLAD 45, oD1 65 ; elevated PCWP (mean 29), moderate pulmonary hypertension, CO 3.66, CI 2.07  . Depression with anxiety 07/08/2017  . Essential hypertension 07/08/2017  . GERD with esophagitis 01/11/2018  . Hernia of abdominal cavity   . Hyperlipidemia   . Hyperlipidemia LDL goal <130 05/25/2017   The 10-year ASCVD risk score Mikey Bussing DC Jr., et al., 2013) is: 31.1%   Values used to calculate the score:     Age: 24 years     Sex: Male     Is Non-Hispanic African American: No     Diabetic: No     Tobacco smoker: No     Systolic Blood Pressure: 240 mmHg     Is BP treated: No     HDL Cholesterol: 68.7 mg/dL     Total Cholesterol: 258 mg/dL   . Idiopathic inflammatory myopathy 05/25/2017  . Neuropathy involving both lower extremities 05/25/2017  . Nonischemic cardiomyopathy ([place]) 11/11/2017   LHC 09/16/17-nonobstructive CAD  . Oropharyngeal dysphagia 01/11/2018  . Type 2 diabetes mellitus with complication, without long-term current use of insulin ([place]) 07/08/2017    Patient Active Problem List   Diagnosis Date Noted  . Allergic rhinitis due to pollen 03/29/2018  . Oropharyngeal dysphagia 01/11/2018  . GERD with esophagitis 01/11/2018  . Claudication of both lower extremities ([place]) 01/06/2018  . Nonischemic cardiomyopathy ([place]) 11/11/2017  . Coronary artery  disease involving native coronary artery of native heart without angina pectoris 11/11/2017  . Chronic systolic heart failure ([place])   . Essential hypertension 07/08/2017  . Depression with anxiety 07/08/2017  . Type 2 diabetes mellitus with complication, without long-term current use of insulin ([place]) 07/08/2017  . Hyperlipidemia LDL goal <130 05/25/2017    Past Surgical History:  Procedure Laterality Date  . BACK SURGERY    . HERNIA REPAIR    . RIGHT/LEFT HEART CATH AND CORONARY ANGIOGRAPHY N/A 09/16/2017   Procedure: RIGHT/LEFT HEART CATH AND CORONARY ANGIOGRAPHY;  Surgeon: Martinique, Peter M, MD;   Location: [place] [facility];  Service: Cardiovascular;  Laterality: N/A;  . SPINE SURGERY         Family History  Problem Relation Age of Onset  . Stroke Mother   . Kidney disease Father   . Stroke Father   . Hypertension Father   . Early death Father   . Cancer Neg Hx   . Alcohol abuse Neg Hx   . Diabetes Neg Hx   . Heart disease Neg Hx   . Hyperlipidemia Neg Hx     Social History   Tobacco Use  . Smoking status: Never Smoker  . Smokeless tobacco: Never Used  Substance Use Topics  . Alcohol use: No  . Drug use: No    Home Medications Prior to Admission medications   Medication Sig Start Date End Date Taking? Authorizing Provider  aspirin EC 81 MG tablet Take 1 tablet (81 mg total) by mouth daily. 07/08/17   Janith Lima, MD  carvedilol (COREG) 6.25 MG tablet TAKE 1 AND 1/2 TABLETS BY MOUTH TWICE DAILY 11/03/19   Nahser, [name], MD  cholecalciferol (VITAMIN D) 1000 units tablet Take 1,000 Units by mouth daily.    [provider]  doxycycline (VIBRA-TABS) 100 MG tablet Take 1 tablet (100 mg total) by mouth 2 (two) times daily. 10/26/19   Maximiano Coss, NP  esomeprazole (NEXIUM) 40 MG capsule Take 1 capsule (40 mg total) by mouth as needed (for heartburn). 05/19/19   Nahser, [name], MD  furosemide (LASIX) 40 MG tablet Take 0.5 tablets (20 mg total) by mouth 3 (three) times a week. Patient not taking: Reported on 10/12/2019 10/18/18   Fay Records, MD  olopatadine (PATANOL) 0.1 % ophthalmic solution Place 1 drop into both eyes as needed for allergies. 10/26/19   Maximiano Coss, NP  rosuvastatin (CRESTOR) 5 MG tablet TAKE 1 TABLET BY MOUTH DAILY 09/21/19   Nahser, [name], MD  sacubitril-valsartan (ENTRESTO) 24-26 MG Take 1 tablet by mouth 2 (two) times daily. 03/28/19   Nahser, [name], MD    Allergies    Amoxicillin, Bactrim [sulfamethoxazole-trimethoprim], Cephalexin, Hydrocodone, Lexapro [escitalopram oxalate], Lipitor [atorvastatin], Oxycodone, Statins, and  Vytorin [ezetimibe-simvastatin]  Review of Systems   Review of Systems  Constitutional: Negative for appetite change, chills and fever.  HENT: Negative for ear pain, rhinorrhea, sneezing and sore throat.   Eyes: Negative for photophobia and visual disturbance.  Respiratory: Negative for cough, chest tightness, shortness of breath and wheezing.   Cardiovascular: Negative for chest pain and palpitations.  Gastrointestinal: Negative for abdominal pain, blood in stool, constipation, diarrhea, nausea and vomiting.  Genitourinary: Negative for dysuria, hematuria and urgency.  Musculoskeletal: Negative for myalgias.  Skin: Negative for rash.  Neurological: Positive for dizziness and numbness. Negative for weakness and light-headedness.    Physical Exam Updated Vital Signs BP (!) 148/98 (BP Location: Right Arm)   Pulse 94  Temp 98.3 F (36.8 C) (Oral)   Resp 15   Ht _0  (1.676 m)   Wt 74.8 kg   SpO2 98%   BMI 26.63 kg/m   Physical Exam Vitals and nursing note reviewed.  Constitutional:      General: He is not in acute distress.    Appearance: He is well-developed.  HENT:     Head: Normocephalic and atraumatic.     Nose: Nose normal.  Eyes:     General: No scleral icterus.       Right eye: No discharge.        Left eye: No discharge.     Conjunctiva/sclera: Conjunctivae normal.     Pupils: Pupils are equal, round, and reactive to light.  Cardiovascular:     Rate and Rhythm: Normal rate and regular rhythm.     Heart sounds: Normal heart sounds. No murmur. No friction rub. No gallop.   Pulmonary:     Effort: Pulmonary effort is normal. No respiratory distress.     Breath sounds: Normal breath sounds.  Abdominal:     General: Bowel sounds are normal. There is no distension.     Palpations: Abdomen is soft.     Tenderness: There is no abdominal tenderness. There is no guarding.  Musculoskeletal:        General: Normal range of motion.     Cervical back: Normal range of  motion and neck supple.  Skin:    General: Skin is warm and dry.     Findings: No rash.  Neurological:     General: No focal deficit present.     Mental Status: He is alert and oriented to person, place, and time.     Cranial Nerves: No cranial nerve deficit.     Sensory: No sensory deficit.     Motor: No weakness or abnormal muscle tone.     Coordination: Coordination normal.     Comments: Alert, oriented x3.  Strength 5/5 in bilateral upper and lower extremities.  Sensation intact to light touch of bilateral upper and lower extremities.  Slight left-sided eye droop but patient states this is chronic for him.     ED Results / Procedures / Treatments   Labs (all labs ordered are listed, but only abnormal results are displayed) Labs Reviewed - No data to display  EKG None  Radiology DG Chest 2 View  Result Date: 02/24/2020 CLINICAL DATA:  Abnormal electrocardiogram.  Confusion. EXAM: CHEST - 2 VIEW COMPARISON:  September 14, 2017 FINDINGS: There is atelectatic change in the left base. The lungs elsewhere are clear. Heart is upper normal in size with pulmonary vascularity normal. No adenopathy. There is an apparent hiatal hernia. There is degenerative change in the thoracic spine. IMPRESSION: Left base atelectasis. Lungs elsewhere clear. Heart upper normal in size. Hiatal hernia present. No adenopathy appreciable. Electronically Signed   By: Lowella Grip III M.D.   On: 02/24/2020 10:37    Procedures Procedures (including critical care time)  Medications Ordered in ED Medications  LORazepam (ATIVAN) injection 1 mg (has no administration in time range)    ED Course  I have reviewed the triage vital signs and the nursing notes.  Pertinent labs & imaging results that were available during my care of the patient were reviewed by me and considered in my medical decision making (see chart for details).    MDM Rules/Calculators/A&P  79 year old male with a  past medical history of CHF, CAD, GERD presenting to the ED for dizziness.  Reports intermittent confusion and "fogginess" that has worsened over the past week.  Also complains of numbness to bilateral lower extremities.  Numbness has been chronic for several months and usually improves with movement, ambulation.  Patient without any new neurological deficits noted on today's exam.  No weakness or changes to sensation noted to bilateral upper and lower extremities.  He is alert and oriented x4.  Reviewed lab work from patient's visit yesterday in the office which was negative for acute abnormality.  Will order MRI of the brain with and without contrast per PCP recommendations.  If this is negative for acute abnormality, patient will be discharged home to follow-up with PCP. Care handed off to oncoming provider pending remainder of work-up and recheck.  Portions of this note were generated with [employer]. Dictation errors may occur despite best attempts at proofreading.  Final Clinical Impression(s) / ED Diagnoses Final diagnoses:  Dizziness    Rx / DC Orders ED Discharge Orders    None       Lulu Riding 02/25/20 2052    Noemi Chapel, MD 02/25/20 2326

## 2020-02-25 NOTE — Consult Note (Addendum)
Neurology Consultation  Reason for Consult: Stroke Referring Physician: Dr. Noemi Chapel, EDP  CC: Dizziness, fogginess  History is obtained from: Patient, chart  HPI: Christian [name] is a 79 y.o. male past history of chronic systolic heart failure, coronary artery disease, hypertension, hyperlipidemia, bilateral lower extremity neuropathy, diabetes, presenting to the emergency room with multiple complaints-most notable of which is feeling foggy while doing things. He cannot give me a clear last known well but says that over the past few days he has noticed that he is not been able to think correctly, as well as had difficulty identifying the roads that he is driven on for the past many years. He also complains of some numbness in his feet which has been a longstanding problem with no real exacerbation. In addition to feeling foggy, he is also felt a little dizzy-lightheaded and had some pain in the back of his head along with some increasing numbness in both of his lower extremities.  He reached out to one of his family members who is a physician out of state, who recommended that he come to the emergency room for urgent evaluation for potential acute neurological symptoms. He had seen his PCP yesterday, who had done some labs which were reportedly normal and had scheduled him for an outpatient MRI.  The family member recommended that he come to the ER and maybe get the imaging done sooner rather than later.  MR brain was done which showed patchy right PCA territory infarcts. Neurological consultation was obtained for further work-up recommendations.    LKW: Unclear tpa given?: no, unclear and unknown last known well Premorbid modified Rankin scale (mRS): 1  ROS: Performed and negative except as noted in HPI  Past Medical History:  Diagnosis Date  . Allergic rhinitis due to pollen 03/29/2018  . Chronic systolic heart failure (HCC) - EF improved to normal on Rx    Echo 11/18: Mild LVH, EF  20, diffuse HK mild AI, a sending aorta 42 mm (mildly dilated), mild MR, mild TR, trivial PI, PASP 44 // Echo 3/19: mild LVH, EF 25-30, diff HK, Gr 1 DD, asc aorta 43 mm (mildly dilated), MAC, mild reduced RVSF // Limited echo 10/19: Mild concentric LVH, EF 41-74, grade 1 diastolic dysfunction, MAC, mild LAE   . Coronary artery disease involving native coronary artery of native heart without angina pectoris 11/11/2017   R/L HC 11/18: pLAD 45, oD1 65 ; elevated PCWP (mean 29), moderate pulmonary hypertension, CO 3.66, CI 2.07  . Depression with anxiety 07/08/2017  . Essential hypertension 07/08/2017  . GERD with esophagitis 01/11/2018  . Hernia of abdominal cavity   . Hyperlipidemia   . Hyperlipidemia LDL goal <130 05/25/2017   The 10-year ASCVD risk score Mikey Bussing DC Jr., et al., 2013) is: 31.1%   Values used to calculate the score:     Age: 30 years     Sex: Male     Is Non-Hispanic African American: No     Diabetic: No     Tobacco smoker: No     Systolic Blood Pressure: 081 mmHg     Is BP treated: No     HDL Cholesterol: 68.7 mg/dL     Total Cholesterol: 258 mg/dL   . Idiopathic inflammatory myopathy 05/25/2017  . Neuropathy involving both lower extremities 05/25/2017  . Nonischemic cardiomyopathy ([place]) 11/11/2017   LHC 09/16/17-nonobstructive CAD  . Oropharyngeal dysphagia 01/11/2018  . Type 2 diabetes mellitus with complication, without long-term current use of insulin ([place])  07/08/2017    Family History  Problem Relation Age of Onset  . Stroke Mother   . Kidney disease Father   . Stroke Father   . Hypertension Father   . Early death Father   . Cancer Neg Hx   . Alcohol abuse Neg Hx   . Diabetes Neg Hx   . Heart disease Neg Hx   . Hyperlipidemia Neg Hx   Both parents with history of stroke.   Social History:   reports that he has never smoked. He has never used smokeless tobacco. He reports that he does not drink alcohol or use drugs.  No history of tobacco alcohol or drug  abuse.  Medications No current facility-administered medications for this encounter.  Current Outpatient Medications:  .  aspirin EC 81 MG tablet, Take 1 tablet (81 mg total) by mouth daily., Disp: 90 tablet, Rfl: 3 .  carvedilol (COREG) 6.25 MG tablet, TAKE 1 AND 1/2 TABLETS BY MOUTH TWICE DAILY, Disp: 90 tablet, Rfl: 6 .  cholecalciferol (VITAMIN D) 1000 units tablet, Take 1,000 Units by mouth daily., Disp: , Rfl:  .  doxycycline (VIBRA-TABS) 100 MG tablet, Take 1 tablet (100 mg total) by mouth 2 (two) times daily., Disp: 30 tablet, Rfl: 1 .  esomeprazole (NEXIUM) 40 MG capsule, Take 1 capsule (40 mg total) by mouth as needed (for heartburn)., Disp: 30 capsule, Rfl: 5 .  furosemide (LASIX) 40 MG tablet, Take 0.5 tablets (20 mg total) by mouth 3 (three) times a week. (Patient not taking: Reported on 10/12/2019), Disp: 30 tablet, Rfl: 6 .  olopatadine (PATANOL) 0.1 % ophthalmic solution, Place 1 drop into both eyes as needed for allergies., Disp: 5 mL, Rfl: 2 .  rosuvastatin (CRESTOR) 5 MG tablet, TAKE 1 TABLET BY MOUTH DAILY, Disp: 90 tablet, Rfl: 3 .  sacubitril-valsartan (ENTRESTO) 24-26 MG, Take 1 tablet by mouth 2 (two) times daily., Disp: 180 tablet, Rfl: 3   Exam: Current vital signs: BP 133/88   Pulse 95   Temp 98.3 F (36.8 C) (Oral)   Resp (!) 21   Ht _0  (1.676 m)   Wt 74.8 kg   SpO2 95%   BMI 26.63 kg/m  Vital signs in last 24 hours: Temp:  [98.3 F (36.8 C)] 98.3 F (36.8 C) (05/08 1917) Pulse Rate:  [93-99] 95 (05/08 2326) Resp:  [15-24] 21 (05/08 2326) BP: (133-148)/(88-98) 133/88 (05/08 2321) SpO2:  [95 %-99 %] 95 % (05/08 2326) Weight:  [74.8 kg] 74.8 kg (05/08 1917) General: Very pleasant elderly man lying comfortably in bed in no acute distress. HEENT: Normocephalic, atraumatic, dry oral mucous membranes. CVS: Regular rate rhythm Respiratory: Breathing normally and saturating well on room air Extremities: Warm well perfused with trace edema Abdomen  nondistended nontender Neurological exam Awake alert oriented x3 Speech is not dysarthric He is a bit slow to respond to questions but part of it as he is extremely hard of hearing. He has no evidence of aphasia.  Naming comprehension repetition and fluency are all intact. Cranial nerves: Pupils are equal round reactive to light, extraocular movements appear intact, visual field examination shows left hemifield deficit but it was very difficult for him to follow the exam completely but he certainly would not count my fingers on the left hemifield and would not blink to threat from that left side.  No facial asymmetry.  Facial sensation intact.  Tongue and palate midline.  Auditory acuity grossly reduced bilaterally. Motor exam: Antigravity in all fours without  drift Sensory exam: Diminished stocking type pattern of light touch sensation in both lower extremities. Gait testing was deferred for patient comfort and safety at this time NIH stroke scale-2 for hemianopsia  Labs I have reviewed labs in epic and the results pertinent to this consultation are:  CBC    Component Value Date/Time   WBC 6.3 02/24/2020 1013   WBC 5.7 03/29/2018 1141   RBC 5.19 02/24/2020 1013   RBC 5.14 03/29/2018 1141   HGB 15.4 02/24/2020 1013   HCT 46.1 02/24/2020 1013   PLT 202 02/24/2020 1013   MCV 89 02/24/2020 1013   MCH 29.7 02/24/2020 1013   MCH 28.8 09/16/2017 0529   MCHC 33.4 02/24/2020 1013   MCHC 33.6 03/29/2018 1141   RDW 13.3 02/24/2020 1013   LYMPHSABS 1.5 02/24/2020 1013   MONOABS 0.4 09/14/2017 1431   EOSABS 0.3 02/24/2020 1013   BASOSABS 0.0 02/24/2020 1013    CMP     Component Value Date/Time   NA 141 02/24/2020 1013   K 4.4 02/24/2020 1013   CL 105 02/24/2020 1013   CO2 22 02/24/2020 1013   GLUCOSE 116 (H) 02/24/2020 1013   GLUCOSE 111 (H) 03/29/2018 1141   BUN 17 02/24/2020 1013   CREATININE 0.94 02/24/2020 1013   CALCIUM 9.1 02/24/2020 1013   PROT 6.5 02/24/2020 1013    ALBUMIN 4.4 02/24/2020 1013   AST 16 02/24/2020 1013   ALT 13 02/24/2020 1013   ALKPHOS 50 02/24/2020 1013   BILITOT 0.9 02/24/2020 1013   GFRNONAA 77 02/24/2020 1013   GFRAA 89 02/24/2020 1013    Imaging I have reviewed the images obtained:  MRI examination of the brain-consistent with acute infarction of the right PCA territory that includes the right occipital, right mesial temporal cortex as well as hippocampus and a small punctate area of restricted diffusion in the right thalamus.  Assessment:  79 year old man with above past medical history presenting for evaluation of dizziness, headache and numbness in both legs that has been worse than his usual peripheral neuropathy, on exam has left homonymous hemianopsia and MRI of the brain revealing right PCA territory strokes. He does not have a history of atrial fibrillation but has an extensive cardiac history of nonischemic cardiomyopathy and chronic systolic heart failure. The likely etiology of stroke is likely cardioembolic but other etiologies such as large vessel disease should also be entertained.  Small vessel disease risk factors should also be optimized given his age and risk factors  Not a candidate for TPA or intervention due to unknown last normal.  Impression: Acute ischemic stroke involving the right PCA territory with clinical exam revealing left hemianopsia. Chronic peripheral neuropathy Diabetes Chronic systolic heart failure Hypertension Hyperlipidemia  Recommendations Admit to hospitalist Telemetry Frequent neurochecks CTA head and neck Increase aspirin to 325 for now.  Based on vessel imaging, will make a decision on dual antiplatelets. He is on rosuvastatin 5 mg p.o. daily-change it to atorvastatin 80 mg daily for now. A1c Lipid panel 2D echocardiogram PT OT Speech therapy N.p.o. until cleared by either bedside swallow evaluation or formal swallow evaluation. Stroke team will continue to follow the  patient with you.  Plan was discussed over the phone with Dr. Sabra Heck in the ED.  -- Amie Portland, MD [facility] Pager: [phone] If 7pm to 7am, please call on call as listed on AMION.   Addendum: 11:51 PM Called and spoke to wife over the phone. Explained the current MRI findings and the work-up.  Surprisingly, she was unaware of the fact that he has a history of diabetes although it has been documented in his chart from his primary care note from yesterday as well. Stroke neurology will continue to follow with you. Amie Portland, MD

## 2020-02-25 NOTE — ED Notes (Signed)
Pt transported to MRI 

## 2020-02-25 NOTE — ED Triage Notes (Addendum)
Pt BIB [facility] from home c/o dizziness and numbness in the legs. This is an ongoing issue and pt is scheduled for an MRI. Pt states his cousin told him he should just come to the ED. Pt denies any pain but difficulty standing when his legs do get numb. A&O x4.

## 2020-02-26 ENCOUNTER — Inpatient Hospital Stay (HOSPITAL_COMMUNITY): Payer: Medicare Other

## 2020-02-26 DIAGNOSIS — Z880 Allergy status to penicillin: Secondary | ICD-10-CM | POA: Diagnosis not present

## 2020-02-26 DIAGNOSIS — I110 Hypertensive heart disease with heart failure: Secondary | ICD-10-CM | POA: Diagnosis present

## 2020-02-26 DIAGNOSIS — Z888 Allergy status to other drugs, medicaments and biological substances status: Secondary | ICD-10-CM | POA: Diagnosis not present

## 2020-02-26 DIAGNOSIS — Z7982 Long term (current) use of aspirin: Secondary | ICD-10-CM | POA: Diagnosis not present

## 2020-02-26 DIAGNOSIS — L309 Dermatitis, unspecified: Secondary | ICD-10-CM | POA: Diagnosis not present

## 2020-02-26 DIAGNOSIS — I130 Hypertensive heart and chronic kidney disease with heart failure and stage 1 through stage 4 chronic kidney disease, or unspecified chronic kidney disease: Secondary | ICD-10-CM | POA: Diagnosis not present

## 2020-02-26 DIAGNOSIS — I639 Cerebral infarction, unspecified: Secondary | ICD-10-CM

## 2020-02-26 DIAGNOSIS — K469 Unspecified abdominal hernia without obstruction or gangrene: Secondary | ICD-10-CM | POA: Diagnosis present

## 2020-02-26 DIAGNOSIS — Z881 Allergy status to other antibiotic agents status: Secondary | ICD-10-CM | POA: Diagnosis not present

## 2020-02-26 DIAGNOSIS — Z79899 Other long term (current) drug therapy: Secondary | ICD-10-CM | POA: Diagnosis not present

## 2020-02-26 DIAGNOSIS — R29702 NIHSS score 2: Secondary | ICD-10-CM | POA: Diagnosis present

## 2020-02-26 DIAGNOSIS — I6389 Other cerebral infarction: Secondary | ICD-10-CM | POA: Diagnosis not present

## 2020-02-26 DIAGNOSIS — R42 Dizziness and giddiness: Secondary | ICD-10-CM | POA: Diagnosis not present

## 2020-02-26 DIAGNOSIS — E1122 Type 2 diabetes mellitus with diabetic chronic kidney disease: Secondary | ICD-10-CM | POA: Diagnosis not present

## 2020-02-26 DIAGNOSIS — Z841 Family history of disorders of kidney and ureter: Secondary | ICD-10-CM | POA: Diagnosis not present

## 2020-02-26 DIAGNOSIS — H9190 Unspecified hearing loss, unspecified ear: Secondary | ICD-10-CM | POA: Diagnosis not present

## 2020-02-26 DIAGNOSIS — G4700 Insomnia, unspecified: Secondary | ICD-10-CM | POA: Diagnosis not present

## 2020-02-26 DIAGNOSIS — Z8249 Family history of ischemic heart disease and other diseases of the circulatory system: Secondary | ICD-10-CM | POA: Diagnosis not present

## 2020-02-26 DIAGNOSIS — F418 Other specified anxiety disorders: Secondary | ICD-10-CM | POA: Diagnosis present

## 2020-02-26 DIAGNOSIS — H53462 Homonymous bilateral field defects, left side: Secondary | ICD-10-CM | POA: Diagnosis present

## 2020-02-26 DIAGNOSIS — I611 Nontraumatic intracerebral hemorrhage in hemisphere, cortical: Secondary | ICD-10-CM | POA: Diagnosis present

## 2020-02-26 DIAGNOSIS — K219 Gastro-esophageal reflux disease without esophagitis: Secondary | ICD-10-CM | POA: Diagnosis not present

## 2020-02-26 DIAGNOSIS — G5793 Unspecified mononeuropathy of bilateral lower limbs: Secondary | ICD-10-CM | POA: Diagnosis present

## 2020-02-26 DIAGNOSIS — I272 Pulmonary hypertension, unspecified: Secondary | ICD-10-CM | POA: Diagnosis not present

## 2020-02-26 DIAGNOSIS — E785 Hyperlipidemia, unspecified: Secondary | ICD-10-CM | POA: Diagnosis not present

## 2020-02-26 DIAGNOSIS — I428 Other cardiomyopathies: Secondary | ICD-10-CM | POA: Diagnosis not present

## 2020-02-26 DIAGNOSIS — J301 Allergic rhinitis due to pollen: Secondary | ICD-10-CM | POA: Diagnosis present

## 2020-02-26 DIAGNOSIS — E119 Type 2 diabetes mellitus without complications: Secondary | ICD-10-CM | POA: Diagnosis not present

## 2020-02-26 DIAGNOSIS — R109 Unspecified abdominal pain: Secondary | ICD-10-CM | POA: Diagnosis not present

## 2020-02-26 DIAGNOSIS — R7303 Prediabetes: Secondary | ICD-10-CM | POA: Diagnosis present

## 2020-02-26 DIAGNOSIS — Z823 Family history of stroke: Secondary | ICD-10-CM | POA: Diagnosis not present

## 2020-02-26 DIAGNOSIS — I63531 Cerebral infarction due to unspecified occlusion or stenosis of right posterior cerebral artery: Secondary | ICD-10-CM | POA: Diagnosis present

## 2020-02-26 DIAGNOSIS — I2510 Atherosclerotic heart disease of native coronary artery without angina pectoris: Secondary | ICD-10-CM | POA: Diagnosis not present

## 2020-02-26 DIAGNOSIS — H547 Unspecified visual loss: Secondary | ICD-10-CM | POA: Diagnosis present

## 2020-02-26 DIAGNOSIS — I5022 Chronic systolic (congestive) heart failure: Secondary | ICD-10-CM | POA: Diagnosis not present

## 2020-02-26 DIAGNOSIS — E1140 Type 2 diabetes mellitus with diabetic neuropathy, unspecified: Secondary | ICD-10-CM | POA: Diagnosis not present

## 2020-02-26 DIAGNOSIS — Z885 Allergy status to narcotic agent status: Secondary | ICD-10-CM | POA: Diagnosis not present

## 2020-02-26 DIAGNOSIS — K2100 Gastro-esophageal reflux disease with esophagitis, without bleeding: Secondary | ICD-10-CM | POA: Diagnosis not present

## 2020-02-26 DIAGNOSIS — Z66 Do not resuscitate: Secondary | ICD-10-CM | POA: Diagnosis not present

## 2020-02-26 DIAGNOSIS — I63339 Cerebral infarction due to thrombosis of unspecified posterior cerebral artery: Secondary | ICD-10-CM | POA: Diagnosis not present

## 2020-02-26 DIAGNOSIS — R1312 Dysphagia, oropharyngeal phase: Secondary | ICD-10-CM | POA: Diagnosis not present

## 2020-02-26 DIAGNOSIS — I63233 Cerebral infarction due to unspecified occlusion or stenosis of bilateral carotid arteries: Secondary | ICD-10-CM | POA: Diagnosis not present

## 2020-02-26 DIAGNOSIS — I10 Essential (primary) hypertension: Secondary | ICD-10-CM | POA: Diagnosis not present

## 2020-02-26 DIAGNOSIS — N182 Chronic kidney disease, stage 2 (mild): Secondary | ICD-10-CM | POA: Diagnosis not present

## 2020-02-26 DIAGNOSIS — I69354 Hemiplegia and hemiparesis following cerebral infarction affecting left non-dominant side: Secondary | ICD-10-CM | POA: Diagnosis not present

## 2020-02-26 DIAGNOSIS — Z20822 Contact with and (suspected) exposure to covid-19: Secondary | ICD-10-CM | POA: Diagnosis present

## 2020-02-26 LAB — COMPREHENSIVE METABOLIC PANEL
ALT: 17 U/L (ref 0–44)
AST: 18 U/L (ref 15–41)
Albumin: 3.9 g/dL (ref 3.5–5.0)
Alkaline Phosphatase: 42 U/L (ref 38–126)
Anion gap: 10 (ref 5–15)
BUN: 16 mg/dL (ref 8–23)
CO2: 22 mmol/L (ref 22–32)
Calcium: 8.9 mg/dL (ref 8.9–10.3)
Chloride: 103 mmol/L (ref 98–111)
Creatinine, Ser: 0.95 mg/dL (ref 0.61–1.24)
GFR calc Af Amer: >60 mL/min (ref >60)
GFR calc non Af Amer: >60 mL/min (ref >60)
Glucose, Bld: 149 mg/dL — ABNORMAL HIGH (ref 70–99)
Potassium: 4.5 mmol/L (ref 3.5–5.1)
Sodium: 135 mmol/L (ref 135–145)
Total Bilirubin: 1.4 mg/dL — ABNORMAL HIGH (ref 0.3–1.2)
Total Protein: 6.5 g/dL (ref 6.5–8.1)

## 2020-02-26 LAB — CBC
HCT: 46.3 % (ref 39.0–52.0)
Hemoglobin: 14.8 g/dL (ref 13.0–17.0)
MCH: 29.1 pg (ref 26.0–34.0)
MCHC: 32 g/dL (ref 30.0–36.0)
MCV: 91 fL (ref 80.0–100.0)
Platelets: 204 10*3/uL (ref 150–400)
RBC: 5.09 MIL/uL (ref 4.22–5.81)
RDW: 13.2 % (ref 11.5–15.5)
WBC: 7.9 10*3/uL (ref 4.0–10.5)
nRBC: 0 % (ref 0.0–0.2)

## 2020-02-26 LAB — RESPIRATORY PANEL BY RT PCR (FLU A&B, COVID)
Influenza A by PCR: NEGATIVE
Influenza B by PCR: NEGATIVE
SARS Coronavirus 2 by RT PCR: NEGATIVE

## 2020-02-26 LAB — ECHOCARDIOGRAM COMPLETE
Height: 66 in
Weight: 2640 oz

## 2020-02-26 MED ORDER — CARVEDILOL 6.25 MG PO TABS
9.3750 mg | ORAL_TABLET | Freq: Two times a day (BID) | ORAL | Status: DC
  Administered 2020-02-26 – 2020-02-29 (×7): 9.375 mg via ORAL
  Filled 2020-02-26 (×7): qty 1

## 2020-02-26 MED ORDER — IOHEXOL 350 MG/ML SOLN
100.0000 mL | Freq: Once | INTRAVENOUS | Status: AC | PRN
  Administered 2020-02-26: 100 mL via INTRAVENOUS

## 2020-02-26 MED ORDER — FUROSEMIDE 20 MG PO TABS
20.0000 mg | ORAL_TABLET | ORAL | Status: DC
  Administered 2020-02-27 – 2020-02-29 (×2): 20 mg via ORAL
  Filled 2020-02-26 (×2): qty 1

## 2020-02-26 MED ORDER — OLOPATADINE HCL 0.1 % OP SOLN: 1.0000 [drp] | Freq: Two times a day (BID) | OPHTHALMIC | Status: DC | PRN

## 2020-02-26 MED ORDER — ACETAMINOPHEN 325 MG PO TABS: 650.0000 mg | ORAL_TABLET | ORAL | Status: DC | PRN

## 2020-02-26 MED ORDER — PANTOPRAZOLE SODIUM 40 MG PO TBEC
40.0000 mg | DELAYED_RELEASE_TABLET | Freq: Every day | ORAL | Status: DC
  Filled 2020-02-26: qty 1

## 2020-02-26 MED ORDER — NON FORMULARY: 40.0000 mg | Freq: Every day | Status: DC

## 2020-02-26 MED ORDER — STROKE: EARLY STAGES OF RECOVERY BOOK
Freq: Once | Status: AC
  Filled 2020-02-26 (×2): qty 1

## 2020-02-26 MED ORDER — ACETAMINOPHEN 160 MG/5ML PO SOLN: 650.0000 mg | ORAL | Status: DC | PRN

## 2020-02-26 MED ORDER — ASPIRIN 81 MG PO CHEW
81.0000 mg | CHEWABLE_TABLET | Freq: Every day | ORAL | Status: DC
  Administered 2020-02-26 – 2020-03-01 (×5): 81 mg via ORAL
  Filled 2020-02-26 (×5): qty 1

## 2020-02-26 MED ORDER — ASPIRIN 325 MG PO TABS: 325.0000 mg | ORAL_TABLET | Freq: Every day | ORAL | Status: DC

## 2020-02-26 MED ORDER — VITAMIN D 25 MCG (1000 UNIT) PO TABS
1000.0000 [IU] | ORAL_TABLET | Freq: Every day | ORAL | Status: DC
  Administered 2020-02-26 – 2020-03-01 (×5): 1000 [IU] via ORAL
  Filled 2020-02-26 (×5): qty 1

## 2020-02-26 MED ORDER — ENOXAPARIN SODIUM 40 MG/0.4ML ~~LOC~~ SOLN
40.0000 mg | Freq: Every day | SUBCUTANEOUS | Status: DC
  Filled 2020-02-26: qty 0.4

## 2020-02-26 MED ORDER — ESOMEPRAZOLE MAGNESIUM 20 MG PO CPDR
40.0000 mg | DELAYED_RELEASE_CAPSULE | Freq: Every day | ORAL | Status: DC
  Administered 2020-02-26 – 2020-03-01 (×5): 40 mg via ORAL
  Filled 2020-02-26 (×6): qty 2

## 2020-02-26 MED ORDER — SACUBITRIL-VALSARTAN 24-26 MG PO TABS
1.0000 | ORAL_TABLET | Freq: Two times a day (BID) | ORAL | Status: DC
  Administered 2020-02-26 – 2020-03-01 (×9): 1 via ORAL
  Filled 2020-02-26 (×10): qty 1

## 2020-02-26 MED ORDER — ACETAMINOPHEN 650 MG RE SUPP: 650.0000 mg | RECTAL | Status: DC | PRN

## 2020-02-26 MED ORDER — SENNOSIDES-DOCUSATE SODIUM 8.6-50 MG PO TABS: 1.0000 | ORAL_TABLET | Freq: Every evening | ORAL | Status: DC | PRN

## 2020-02-26 MED ORDER — ALUM & MAG HYDROXIDE-SIMETH 200-200-20 MG/5ML PO SUSP
30.0000 mL | ORAL | Status: DC | PRN
  Administered 2020-02-26: 30 mL via ORAL
  Filled 2020-02-26 (×2): qty 30

## 2020-02-26 MED ORDER — ROSUVASTATIN CALCIUM 5 MG PO TABS
5.0000 mg | ORAL_TABLET | Freq: Every day | ORAL | Status: DC
  Administered 2020-02-26 – 2020-03-01 (×5): 5 mg via ORAL
  Filled 2020-02-26 (×5): qty 1

## 2020-02-26 NOTE — ED Notes (Signed)
Visually checked on Pt. Pt resting well, Vitals WDl normal breathing pattern

## 2020-02-26 NOTE — ED Notes (Signed)
Pt transported to CT via stretcher, advised that nightly meds including Mylanta will be given upon return.

## 2020-02-26 NOTE — H&P (Signed)
[facility] Admission History and Physical Service Pager: [phone]  Patient name: Christian [name] Medical record number: [id number] Date of birth: Sep 24, 1941 Age: 79 y.o. Gender: male  Primary Care Provider: Maximiano Coss, NP Consultants: Neurology Code Status: Partial (allowing for intubation, otherwise minimal intervention)  Emergency contact: Christian [name] (ex-wife)  Chief Complaint:   Assessment and Plan: Christian [name] is a 79 y.o. male presenting with acute stroke. PMH is significant for  has a past medical history of Allergic rhinitis, HFrEF,, CAD,Essential HTN, GERD, Hernia of abdominal cavity, HLD, Neuropathy involving both lower extremities, and DMT2  Acute right PCA CVA Patient with 1 week of worsening dizziness and worsening bilateral lower extremity numbness.  Partial work-up started by PCP in outpatient setting included normal CBC, CMP, TSH, lipid panel, microalbumin, hemoglobin A1c at goal (6.4).  Outpatient MRI was ordered, however patient came to the ED for further work-up.  MRI in the ED shows acute right PCA territory stroke.  Outside the window for TPA.  Patient has multiple risk factors for stroke including diabetes, hyperlipidemia, hypertension.  Minimal deficits on exam, although does have continued "fogginess" and is slow to answer questions sometimes.  Evaluated by neurology recommendations are in Christian [name] note.  It is felt that atrial fibrillation may be the cause, but neurology wants to rule out other causes such as carotid artery stenosis.  Observed on telemetry I see a couple of dropped beats that could represent atrial fibrillation.  Although this was not noted on EKG.  Of note, neurology recommended increasing rosuvastatin to atorvastatin 80 mg, however patient endorsed significant myalgias due to this during the exam.  Additionally listed as an allergy.  We will continue rosuvastatin for now and discuss changing with patient in the  a.m.    Admit to med telemetry, F PTS, attending Christian [name]  CTA head and neck  Increase aspirin to 325 mg  Keep rosuvastatin at 5 mg  Cardiac monitoring  Continuous pulse ox  Echocardiogram  PT/OT/speech  N.p.o. until cleared by bedside or formal swallow  Type 2 diabetes Diet controlled.  A1c 2 days ago was 6.4. -Monitor blood glucose with BMPs -When allowed to eat, carb modified diet  Hyperlipidemia 2 days ago, lipid panel with LDL 74. -Continue rosuvastatin 5 mg, -Discussed with patient about switching to atorvastatin 80 mg, hesitant to start now given patient's history of myalgias  CAD On carvedilol and aspirin -Aspirin increased to 325 mg -Continue home carvedilol  Essential hypertension Outside the window for permissive hypertension.  Normotensive on admission. -Continue carvedilol -Monitor blood pressures  HFrEF History of HFrEF, but most recent echo in 07/2018 showed improved ejection fraction to 60 to 65% -Continue home Entresto and Lasix  Lower extremity neuropathy Chronic.  Unsure if related to diabetic neuropathy or other cause.  It is bilateral.  Patient endorsing worsening due to recent CVA.  GERD Takes omeprazole at home -Continue PPI  FEN/GI: NPO pending swallow study Prophylaxis: Lovenox  Disposition: Admission for stroke work-up  History of Present Illness:  Christian [name] is a 79 y.o. male presenting with 1 week of increased dizziness and lower extremity numbness.  Patient denies any falls or lower extremity weakness.  Patient was seen by PCP 2 days ago who started broad work-up including MRI. Patient was encouraged to cover to the ED by a family member for more urgent work-up.  Outpatient work-up was normal or at goal including TSH, CMP, CBC, lipid panel, A1c.  MRI in ED showed acute  right CVA.  Neurology was consulted with recommendations listed above.  They recommended hospitalization for work-up.  Family medicine was consulted and  evaluated.  Review Of Systems: Per HPI with the following additions: Negative for chest pain, shortness of breath, new blurry vision, cough, fevers, chills, abdominal pain   Patient Active Problem List   Diagnosis Date Noted  . CVA (cerebral vascular accident) ([place]) 02/26/2020  . Allergic rhinitis due to pollen 03/29/2018  . Oropharyngeal dysphagia 01/11/2018  . GERD with esophagitis 01/11/2018  . Claudication of both lower extremities ([place]) 01/06/2018  . Nonischemic cardiomyopathy ([place]) 11/11/2017  . Coronary artery disease involving native coronary artery of native heart without angina pectoris 11/11/2017  . Chronic systolic heart failure ([place])   . Essential hypertension 07/08/2017  . Depression with anxiety 07/08/2017  . Type 2 diabetes mellitus with complication, without long-term current use of insulin ([place]) 07/08/2017  . Hyperlipidemia LDL goal <130 05/25/2017    Past Medical History: Past Medical History:  Diagnosis Date  . Allergic rhinitis due to pollen 03/29/2018  . Chronic systolic heart failure (HCC) - EF improved to normal on Rx    Echo 11/18: Mild LVH, EF 20, diffuse HK mild AI, a sending aorta 42 mm (mildly dilated), mild MR, mild TR, trivial PI, PASP 44 // Echo 3/19: mild LVH, EF 25-30, diff HK, Gr 1 DD, asc aorta 43 mm (mildly dilated), MAC, mild reduced RVSF // Limited echo 10/19: Mild concentric LVH, EF 73-71, grade 1 diastolic dysfunction, MAC, mild LAE   . Coronary artery disease involving native coronary artery of native heart without angina pectoris 11/11/2017   R/L HC 11/18: pLAD 45, oD1 65 ; elevated PCWP (mean 29), moderate pulmonary hypertension, CO 3.66, CI 2.07  . Depression with anxiety 07/08/2017  . Essential hypertension 07/08/2017  . GERD with esophagitis 01/11/2018  . Hernia of abdominal cavity   . Hyperlipidemia   . Hyperlipidemia LDL goal <130 05/25/2017   The 10-year ASCVD risk score Christian [name] DC Jr., et al., 2013) is: 31.1%   Values used to calculate the  score:     Age: 46 years     Sex: Male     Is Non-Hispanic African American: No     Diabetic: No     Tobacco smoker: No     Systolic Blood Pressure: 062 mmHg     Is BP treated: No     HDL Cholesterol: 68.7 mg/dL     Total Cholesterol: 258 mg/dL   . Idiopathic inflammatory myopathy 05/25/2017  . Neuropathy involving both lower extremities 05/25/2017  . Nonischemic cardiomyopathy ([place]) 11/11/2017   LHC 09/16/17-nonobstructive CAD  . Oropharyngeal dysphagia 01/11/2018  . Type 2 diabetes mellitus with complication, without long-term current use of insulin ([place]) 07/08/2017    Past Surgical History: Past Surgical History:  Procedure Laterality Date  . BACK SURGERY    . HERNIA REPAIR    . RIGHT/LEFT HEART CATH AND CORONARY ANGIOGRAPHY N/A 09/16/2017   Procedure: RIGHT/LEFT HEART CATH AND CORONARY ANGIOGRAPHY;  Surgeon: Martinique, Peter M, MD;  Location: [facility];  Service: Cardiovascular;  Laterality: N/A;  . SPINE SURGERY      Social History: Social History   Tobacco Use  . Smoking status: Never Smoker  . Smokeless tobacco: Never Used  Substance Use Topics  . Alcohol use: No  . Drug use: No   Additional social history: Patient does not smoke, he does not drink alcohol.  He lives alone.  He has several dogs  that are taken care of by his ex-wife.  He has no children.  He is able to ambulate without any assistant device. Please also refer to relevant sections of EMR.  Family History: Family History  Problem Relation Age of Onset  . Stroke Mother   . Kidney disease Father   . Stroke Father   . Hypertension Father   . Early death Father   . Cancer Neg Hx   . Alcohol abuse Neg Hx   . Diabetes Neg Hx   . Heart disease Neg Hx   . Hyperlipidemia Neg Hx    Allergies and Medications: Allergies  Allergen Reactions  . Amoxicillin Hives  . Bactrim [Sulfamethoxazole-Trimethoprim] Other (See Comments)    Dried out his eyes  . Cephalexin Itching  . Hydrocodone Itching  . Lexapro  [Escitalopram Oxalate] Other (See Comments)    disorientation  . Lipitor [Atorvastatin] Other (See Comments)    Damage to calf muscles  . Oxycodone Itching  . Statins Other (See Comments)    Muscle pain   . Vytorin [Ezetimibe-Simvastatin] Other (See Comments)    Damage to calf muscles   No current facility-administered medications on file prior to encounter.   Current Outpatient Medications on File Prior to Encounter  Medication Sig Dispense Refill  . aspirin EC 81 MG tablet Take 1 tablet (81 mg total) by mouth daily. 90 tablet 3  . carvedilol (COREG) 6.25 MG tablet TAKE 1 AND 1/2 TABLETS BY MOUTH TWICE DAILY 90 tablet 6  . cholecalciferol (VITAMIN D) 1000 units tablet Take 1,000 Units by mouth daily.    Marland Kitchen esomeprazole (NEXIUM) 40 MG capsule Take 1 capsule (40 mg total) by mouth as needed (for heartburn). 30 capsule 5  . furosemide (LASIX) 40 MG tablet Take 0.5 tablets (20 mg total) by mouth 3 (three) times a week. (Patient not taking: Reported on 10/12/2019) 30 tablet 6  . olopatadine (PATANOL) 0.1 % ophthalmic solution Place 1 drop into both eyes as needed for allergies. 5 mL 2  . rosuvastatin (CRESTOR) 5 MG tablet TAKE 1 TABLET BY MOUTH DAILY 90 tablet 3  . sacubitril-valsartan (ENTRESTO) 24-26 MG Take 1 tablet by mouth 2 (two) times daily. 180 tablet 3    Objective: BP (!) 130/97 (BP Location: Right Arm)   Pulse 99   Temp 98.3 F (36.8 C) (Oral)   Resp 17   Ht _0  (1.676 m)   Wt 74.8 kg   SpO2 97%   BMI 26.63 kg/m  Exam: General: No acute distress, resting comfortably in bed Eyes: PERRLA EOMI, ENTM: MMM, no oropharyngeal lesions Neck: Supple Cardiovascular: RRR, no MRG Respiratory: CTA B, and WOB Gastrointestinal: Soft, nondistended, nontender MSK: No lower extremity edema, 5 of 5 strength in upper and lower extremities bilaterally Derm: Dry, no rash Neuro: Cranial nerves II through XII grossly intact, able to stand but is slightly unsteady on his feet Psych:  Rational thought content, although slow retrieval sometimes  Labs and Imaging: CBC BMET  Recent Labs  Lab 02/24/20 1013  WBC 6.3  HGB 15.4  HCT 46.1  PLT 202   Recent Labs  Lab 02/24/20 1013  NA 141  K 4.4  CL 105  CO2 22  BUN 17  CREATININE 0.94  GLUCOSE 116*  CALCIUM 9.1     MRI IMPRESSION: 1. Patchy acute infarct in the right PCA territory. 2. Remote left occipital cortex infarct and remote bilateral superficial occipital hemorrhage. 3. Abnormal or absent flow in the non dominant right  vertebral Artery  EKG Sinus  Rhythm  -Poor R-wave progression -may be secondary to pulmonary disease   consider old anterior infarct.   Low voltage -possible pulmonary disease.   Lipid Panel     Component Value Date/Time   CHOL 142 02/24/2020 1013   TRIG 114 02/24/2020 1013   HDL 47 02/24/2020 1013   CHOLHDL 3.0 02/24/2020 1013   CHOLHDL 4 07/08/2017 1212   VLDL 21.8 07/08/2017 1212   LDLCALC 74 02/24/2020 1013   LABVLDL 21 02/24/2020 1013   HgA1C - 6.4  TSH - 1.65  Bonnita Hollow, MD 02/26/2020, 12:30 AM PGY-3, [place] [facility] pager: [phone], text pages welcome

## 2020-02-26 NOTE — Progress Notes (Signed)
  Echocardiogram 2D Echocardiogram has been performed.  Christian [name] 02/26/2020, 9:05 AM

## 2020-02-26 NOTE — Progress Notes (Addendum)
CTA head and neck reviewed by Dr. Jeannine Boga. He called me with a concern of small area of what looks like subarachnoid bleed on the CT head.  This was read as possible chronic microhemorrhage/superficial hemorrhage but on the FLAIR images there is hyperintensity which might mean this is either some acute very small volume subarachnoid hemorrhage.  Other possibilities are that this could be cortical laminar necrosis as his symptoms have been going on for a few days and the last known normal truly is unclear.  I will change the aspirin to 81 mg daily instead of 325 and follow-up with a 24-hour head CT to ensure stability and no worsening of these findings. I would also hold off on any Lovenox or heparin subcu until repeat imaging is done.  Use SCDs for DVT prophylaxis.  I have paged the primary team resident on call and am awaiting callback to relay these recommendations.  -- Amie [place], MD [facility] Pager: [phone] If 7pm to 7am, please call on call as listed on AMION.

## 2020-02-26 NOTE — Evaluation (Signed)
Physical Therapy Evaluation Patient Details Name: Christian [name] MRN: [id number] DOB: December 22, 1940 Today's Date: 02/26/2020   History of Present Illness  Patient is a 79 y/o male who presents with "feeling foggy," dizziness and numbness in BLEs. Brain MRI- right PCA infarct. HEad CT- possible bleed. PMH includes neuropathy involving BLEs, DM, HTN, HLD, depression, anxiety,CAD and heart failure.  Clinical Impression  Patient presents with generalized weakness, impaired sensation BLEs, visual deficits, impaired balance and impaired mobility s/p above. Pt was independent PTA and works for a [employer]. Today, pt requires Min-mod A for transfers and gait training putting pt at increased risk for falls. Also with poor awareness of deficits/safety. Pt reports he has support from his ex-wife and g/f [name] and [name]) at d/c. Would benefit from CIR to maximize independence and mobility prior to return home. Will follow acutely.    Follow Up Recommendations CIR;Supervision for mobility/OOB;Supervision/Assistance - 24 hour    Equipment Recommendations  Other (comment)(defer to next venue)    Recommendations for Other Services       Precautions / Restrictions Precautions Precautions: Fall Precaution Comments: visual field loss on left Restrictions Weight Bearing Restrictions: No      Mobility  Bed Mobility Overal bed mobility: Needs Assistance Bed Mobility: Supine to Sit     Supine to sit: Min guard;HOB elevated     General bed mobility comments: Use of rail and increased time, no asist needed. + dizziness. VSS.  Transfers Overall transfer level: Needs assistance Equipment used: 2 person hand held assist Transfers: Sit to/from [facility] Sit to Stand: Mod assist;+2 physical assistance Stand pivot transfers: Min assist;+2 physical assistance       General transfer comment: Initially pt standing with LOB back onto bed; stood second time with 2 person assist and  able to stand upright. Able to take a few steps to get to chair with Min A of 2 and wide BoS. Bil knee instability noted.  Ambulation/Gait Ambulation/Gait assistance: Min assist;+2 safety/equipment Gait Distance (Feet): 12 Feet Assistive device: 1 person hand held assist Gait Pattern/deviations: Shuffle;Decreased step length - left;Decreased step length - right;Wide base of support Gait velocity: decreased Gait velocity interpretation: <1.8 ft/sec, indicate of risk for recurrent falls General Gait Details: Short, shuffling steps with increased knee flexion bilaterally.  Stairs            Wheelchair Mobility    Modified Rankin (Stroke Patients Only) Modified Rankin (Stroke Patients Only) Pre-Morbid Rankin Score: No significant disability Modified Rankin: Moderately severe disability     Balance Overall balance assessment: History of Falls;Needs assistance Sitting-balance support: Feet supported;No upper extremity supported Sitting balance-Leahy Scale: Fair     Standing balance support: During functional activity Standing balance-Leahy Scale: Poor Standing balance comment: requires external support.                             Pertinent Vitals/Pain Pain Assessment: No/denies pain    Home Living Family/patient expects to be discharged to:: Private residence Living Arrangements: Alone Available Help at Discharge: Family;Available PRN/intermittently(reports ex wife, [name] or [name] (g/f) can help him at home) Type of Home: House Home Access: Stairs to enter Entrance Stairs-Rails: Right Entrance Stairs-Number of Steps: 3 Home Layout: One level Home Equipment: None;Shower seat - built in      Prior Function Level of Independence: Independent         Comments: Lives with 2 dogs [place]. Does his own ADLs/IADLs,  Working for a [employer]. Drives.     Hand Dominance   Dominant Hand: Right    Extremity/Trunk Assessment   Upper  Extremity Assessment Upper Extremity Assessment: Defer to OT evaluation    Lower Extremity Assessment Lower Extremity Assessment: Generalized weakness;RLE deficits/detail;LLE deficits/detail RLE Deficits / Details: Decreased sensation from knees down to foot-premorbid, otherwise strength 4/5 RLE Sensation: decreased light touch LLE Deficits / Details: Decreased sensation from knees down to foot- premorbid; otherwise strength 4/5. LLE Sensation: decreased light touch       Communication   Communication: HOH  Cognition Arousal/Alertness: Awake/alert Behavior During Therapy: WFL for tasks assessed/performed Overall Cognitive Status: Impaired/Different from baseline Area of Impairment: Problem solving;Awareness;Safety/judgement                         Safety/Judgement: Decreased awareness of safety;Decreased awareness of deficits Awareness: Intellectual Problem Solving: Slow processing General Comments: HOH, slow processing. Poor awareness of deficits/safety.      General Comments General comments (skin integrity, edema, etc.): VSS on RA.    Exercises     Assessment/Plan    PT Assessment Patient needs continued PT services  PT Problem List Decreased strength;Decreased mobility;Decreased safety awareness;Decreased cognition;Impaired sensation;Decreased balance;Decreased knowledge of use of DME       PT Treatment Interventions Therapeutic activities;Gait training;Therapeutic exercise;Patient/family education;Balance training;Functional mobility training;Stair training;Neuromuscular re-education;DME instruction    PT Goals (Current goals can be found in the Care Plan section)  Acute Rehab PT Goals Patient Stated Goal: asking about going home today PT Goal Formulation: With patient Time For Goal Achievement: 03/11/20 Potential to Achieve Goals: Fair    Frequency Min 4X/week   Barriers to discharge Decreased caregiver support;Inaccessible home environment stairs;  lives alone (has some help not sure how much from ex wife and g/f)    Co-evaluation PT/OT/SLP Co-Evaluation/Treatment: Yes Reason for Co-Treatment: To address functional/ADL transfers;For patient/therapist safety PT goals addressed during session: Mobility/safety with mobility;Balance         AM-PAC PT "6 Clicks" Mobility  Outcome Measure Help needed turning from your back to your side while in a flat bed without using bedrails?: A Little Help needed moving from lying on your back to sitting on the side of a flat bed without using bedrails?: A Little Help needed moving to and from a bed to a chair (including a wheelchair)?: A Lot Help needed standing up from a chair using your arms (e.g., wheelchair or bedside chair)?: A Lot Help needed to walk in hospital room?: A Little Help needed climbing 3-5 steps with a railing? : A Lot 6 Click Score: 15    End of Session Equipment Utilized During Treatment: Gait belt Activity Tolerance: Patient tolerated treatment well Patient left: in chair;with call bell/phone within reach;with chair alarm set Nurse Communication: Mobility status PT Visit Diagnosis: Muscle weakness (generalized) (M62.81);Unsteadiness on feet (R26.81);Difficulty in walking, not elsewhere classified (R26.2)    Time: [id number] PT Time Calculation (min) (ACUTE ONLY): 33 min   Charges:   PT Evaluation $PT Eval Moderate Complexity: 1 Mod          Marisa Severin, PT, DPT Acute Rehabilitation Services Pager [phone] Office [place] 02/26/2020, 12:55 PM

## 2020-02-26 NOTE — Progress Notes (Signed)
STROKE TEAM PROGRESS NOTE   HISTORY OF PRESENT ILLNESS (per record) Christian [name] is a 79 y.o. male past history of chronic systolic heart failure, coronary artery disease, hypertension, hyperlipidemia, bilateral lower extremity neuropathy, diabetes, presenting to the emergency room with multiple complaints-most notable of which is feeling foggy while doing things. He cannot give me a clear last known well but says that over the past few days he has noticed that he is not been able to think correctly, as well as had difficulty identifying the roads that he is driven on for the past many years. He also complains of some numbness in his feet which has been a longstanding problem with no real exacerbation. In addition to feeling foggy, he is also felt a little dizzy-lightheaded and had some pain in the back of his head along with some increasing numbness in both of his lower extremities.  He reached out to one of his family members who is a physician out of state, who recommended that he come to the emergency room for urgent evaluation for potential acute neurological symptoms. He had seen his PCP yesterday, who had done some labs which were reportedly normal and had scheduled him for an outpatient MRI.  The family member recommended that he come to the ER and maybe get the imaging done sooner rather than later.  MR brain was done which showed patchy right PCA territory infarcts. Neurological consultation was obtained for further work-up recommendations. LKW: Unclear tpa given?: no, unclear and unknown last known well Premorbid modified Rankin scale (mRS): 1   INTERVAL HISTORY He seemed to have significant visual impairment on the left hand still with some dizziness.   OBJECTIVE Vitals:   02/26/20 0430 02/26/20 0431 02/26/20 0645 02/26/20 0900  BP: (!) 125/91  102/68 (!) 126/96  Pulse: 95 94 96 88  Resp: 18 18 15 18   Temp: 98.1 F (36.7 C)  98.4 F (36.9 C) 98.3 F (36.8 C)  TempSrc: Oral   Oral Oral  SpO2: (!) 88% 92% 92% 96%  Weight:      Height:        CBC:  Recent Labs  Lab 02/24/20 1013 02/26/20 0132  WBC 6.3 7.9  NEUTROABS 3.8  --   HGB 15.4 14.8  HCT 46.1 46.3  MCV 89 91.0  PLT 202 0000000    Basic Metabolic Panel:  Recent Labs  Lab 02/24/20 1013 02/26/20 0132  NA 141 135  K 4.4 4.5  CL 105 103  CO2 22 22  GLUCOSE 116* 149*  BUN 17 16  CREATININE 0.94 0.95  CALCIUM 9.1 8.9    Lipid Panel:     Component Value Date/Time   CHOL 142 02/24/2020 1013   TRIG 114 02/24/2020 1013   HDL 47 02/24/2020 1013   CHOLHDL 3.0 02/24/2020 1013   CHOLHDL 4 07/08/2017 1212   VLDL 21.8 07/08/2017 1212   LDLCALC 74 02/24/2020 1013   HgbA1c:  Lab Results  Component Value Date   HGBA1C 6.4 (A) 02/24/2020   Urine Drug Screen: No results found for: LABOPIA, COCAINSCRNUR, LABBENZ, AMPHETMU, THCU, LABBARB  Alcohol Level No results found for: ETH  IMAGING  CT ANGIO HEAD W OR WO CONTRAST 02/26/2020 IMPRESSION:   CT HEAD  IMPRESSION:  1. Patchy evolving acute right PCA territory infarct, stable from previous MRA.  2. Patchy hyperdensity involving the right parietal lobe, suspicious for possible small volume subarachnoid hemorrhage. While laminar necrosis could conceivably have this appearance as well, there appears to  be some FLAIR signal abnormality within the cortical sulci on previous brain MRI, favoring hemorrhage.  3. Underlying atrophy with chronic small vessel ischemic disease.   CTA HEAD AND NECK  CT ANGIO NECK W OR WO CONTRAST IMPRESSION:  1. Negative CTA for emergent large vessel occlusion.  2. Severe stenosis of the proximal-mid right P2 segment, with markedly attenuated flow within the right PCA distally.  3. Moderate to severe proximal left P2 stenosis.  4. Hypoplastic right vertebral artery largely terminates in PICA. Moderate atheromatous irregularity throughout the dominant left V4 segment without high-grade stenosis.  5. Atheromatous plaque  about the carotid bifurcations with associated stenosis of up to 40% on the right and 50% on the left.  6. 50% stenosis at the origin of the right subclavian artery.  7.  Emphysema (ICD10-J43.9).  8. 12 mm left thyroid nodule, of doubtful significance given size and patient age. No follow-up imaging recommended. (Ref: J Am Coll Radiol. 2015 Feb;12(2): 143-50).   MR Brain W and Wo Contrast 02/25/2020 IMPRESSION:  1. Patchy acute infarct in the right PCA territory.  2. Remote left occipital cortex infarct and remote bilateral superficial occipital hemorrhage.  3. Abnormal or absent flow in the non dominant right vertebral artery    ECHOCARDIOGRAM COMPLETE 02/26/2020 IMPRESSIONS   1. Left ventricular ejection fraction, by estimation, is 60 to 65%. The left ventricle has normal function. The left ventricle has no regional wall motion abnormalities. Left ventricular diastolic function could not be evaluated.   2. Right ventricular systolic function is normal. The right ventricular size is normal.   3. The mitral valve is grossly normal. No evidence of mitral valve regurgitation. No evidence of mitral stenosis.   4. The aortic valve is tricuspid. Aortic valve regurgitation is not visualized. Mild aortic valve sclerosis is present, with no evidence of aortic valve stenosis.   5. Aortic dilatation noted. There is moderate dilatation of the ascending aorta measuring 42 mm.    MRI is reviewed in person and shows area of reduced signal on SWI involving the superficial areas and deep areas suggestive of remote hemorrhage.  Similar findings are noted in the occipital lobes bilaterally more on the left side.  There is increased signal involving the right thalamic area, mesial temporal lobe and occipital lobe all approximating the right MCA distribution on DWI.  ECG - pending   PHYSICAL EXAM Blood pressure (!) 126/96, pulse 88, temperature 98.3 F (36.8 C), temperature source Oral, resp. rate 18, height  5\' 6"  (1.676 m), weight 74.8 kg, SpO2 96 %. GENERAL: He is some discomfort but no acute distress.  HEENT: He closes the left eye but the strength is normal.  ABDOMEN: soft  EXTREMITIES: No edema   BACK: Normal  SKIN: Normal by inspection.    MENTAL STATUS: He is sleeping but easily arousable.  He is mostly lucid and coherent.  No dysarthria is noted.  CRANIAL NERVES: Pupils are equal, round and reactive to light and accomodation; extra ocular movements are full, there is no significant nystagmus; visual fields a dense left homonymous hemianopia; upper and lower facial muscles are normal in strength and symmetric, there is no flattening of the nasolabial folds; tongue is midline  MOTOR: Normal tone, bulk and strength; no pronator drift.  COORDINATION: Left finger to nose is normal, right finger to nose is normal, No rest tremor; no intention tremor; no postural tremor; no bradykinesia.  REFLEXES: Deep tendon reflexes are symmetrical and normal.   SENSATION: Normal to light  touch, temperature, and pain.        ASSESSMENT/PLAN Christian [name] is a 79 y.o. male with history of chronic systolic heart failure, coronary artery disease, hypertension, hyperlipidemia, bilateral lower extremity neuropathy, diabetes, presenting to the emergency room with multiple complaints-most notable of which is feeling foggy while doing things, pain in the back of his head and light headed or dizzy. He did not receive IV t-PA due to unclear time of onset.  Stroke: Patchy acute infarct in the right PCA territory. Remote left occipital cortex infarct and remote bilateral superficial occipital hemorrhage.  Resultant dense left homonymous hemianopia.  Code Stroke CT Head -      CT head - Patchy evolving acute right PCA territory infarct. Patchy hyperdensity involving the right parietal lobe, suspicious for possible small volume subarachnoid hemorrhage.   MRI head - Patchy acute infarct in the right  PCA territory. Remote left occipital cortex infarct and remote bilateral superficial occipital hemorrhage.   MRA head - not ordered  CTA H&N - Severe stenosis of the proximal-mid right P2 segment, with markedly attenuated flow within the right PCA distally. Moderate to severe proximal left P2 stenosis.   CT Perfusion - not ordered  Carotid Doppler - CTA neck performed - carotid dopplers not indicated.  2D Echo - EF 60 - 65%. No cardiac source of emboli identified.   Sars Corona Virus 2 - negative  LDL - 74  HgbA1c - 6.4  UDS - not ordered  VTE prophylaxis - none Diet  Diet Order            Diet Carb Modified Fluid consistency: Thin; Room service appropriate? No  Diet effective now              aspirin 81 mg daily prior to admission, now on aspirin 81 mg daily  Patient counseled to be compliant with his antithrombotic medications  Ongoing aggressive stroke risk factor management  Therapy recommendations:  pending  Disposition:  Pending  Hypertension  Home BP meds: Coreg ; Entresto  Current BP meds: Coreg ; Entresto  Stable . Long-term BP goal normotensive  Hyperlipidemia  Home Lipid lowering medication: Crestor 5 mg daily  LDL 74, goal < 70  Current lipid lowering medication: Crestor 5 mg daily  Continue statin at discharge  Lipitor intolerance  Diabetes  Home diabetic meds: none  Current diabetic meds: SSI  HgbA1c 6.4, goal < 7.0 No results for input(s): GLUCAP in the last 72 hours.  Other Stroke Risk Factors  Advanced age  Family hx stroke (mother and father)  Coronary artery disease  Congestive Heart Failure  Other Active Problems  Code status - Limited  Emphysema (ICD10-J43.9)   12 mm left thyroid nodule, of doubtful significance given size and patient age. No follow-up imaging recommended   Aortic dilatation noted. There is moderate dilatation of the ascending aorta measuring 42 mm.    Hospital day # 0  Acute right PCA  infarct with a left homonymous hemianopia on aspirin.  Several areas of remote hemorrhage but question of acute subarachnoid hemorrhage.  Repeat CT scan has been ordered.  To contact Stroke Continuity provider, please refer to [email or web address] After hours, contact General Neurology

## 2020-02-26 NOTE — Plan of Care (Signed)
  Problem: Nutrition: Goal: Risk of aspiration will decrease Outcome: Progressing   

## 2020-02-26 NOTE — ED Notes (Signed)
Pt requesting Mylanta, advised that it will be given when ordered & verified

## 2020-02-26 NOTE — Progress Notes (Signed)
Inpatient Rehab Admissions Coordinator Note:   Per PT recommendation, pt was screened for CIR candidacy by Gayland Curry, MS, CCC-SLP.  At this time we are recommending Inpatient Rehab consult.  AC will contact MD to place consult order.  Please contact me with questions.   Gayland Curry, [place], [place] Admissions Coordinator [phone]

## 2020-02-26 NOTE — Evaluation (Signed)
Occupational Therapy Evaluation Patient Details Name: Christian [name] MRN: [id number] DOB: [birth year] Today's Date: 02/26/2020    History of Present Illness Patient is a 79 y/o male who presents with "feeling foggy," dizziness and numbness in BLEs. Brain MRI- right PCA infarct. HEad CT- possible bleed. PMH includes neuropathy involving BLEs, DM, HTN, HLD, depression, anxiety,CAD and heart failure.   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 Pt currently limited by [facility] status, decreased strength, decreased activity tolerance, L peripheral visual deficit and decreased ability to care for self. Pt with decreased awareness of L eye deficit and requires cues for compensatory strategies. Pt requires cues for safety with mobility due to decreased awareness of deficits. Pt modA overall for ADL tasks. Pt motivated to return to PLOF. If pt were to go home, he would be a high fall risk. Pt would benefit from continued OT skilled services for fall risk, L vision and ADL tasks. OT following acutely.      Follow Up Recommendations  CIR;Supervision/Assistance - 24 hour    Equipment Recommendations  Other (comment)(defer to next facility)    Recommendations for Other Services       Precautions / Restrictions Precautions Precautions: Fall Precaution Comments: L hemianopsia Restrictions Weight Bearing Restrictions: No      Mobility Bed Mobility Overal bed mobility: Needs Assistance Bed Mobility: Supine to Sit     Supine to sit: Min guard;HOB elevated     General bed mobility comments: + rail use, increased time; dizziness- resolves. VSS  Transfers Overall transfer level: Needs assistance Equipment used: 2 person hand held assist Transfers: Sit to/from [facility] Sit to Stand: Mod assist;+2 physical assistance Stand pivot transfers: Min assist;+2 physical assistance       General transfer comment: x1 sit to stand with immediate need  to sit down, then modA +2 for power up; minA +2 for stand pivot.    Balance Overall balance assessment: History of Falls;Needs assistance Sitting-balance support: Feet supported;No upper extremity supported Sitting balance-Leahy Scale: Fair     Standing balance support: During functional activity Standing balance-Leahy Scale: Poor Standing balance comment: requires external support.                           ADL either performed or assessed with clinical judgement   ADL Overall ADL's : Needs assistance/impaired Eating/Feeding: Modified independent;Sitting   Grooming: Set up;Sitting   Upper Body Bathing: Minimal assistance;Sitting   Lower Body Bathing: Maximal assistance;Sitting/lateral leans;Sit to/from stand   Upper Body Dressing : Minimal assistance;Sitting   Lower Body Dressing: Maximal assistance;Sitting/lateral leans;Sit to/from stand   Toilet Transfer: Minimal assistance;Stand-pivot;BSC;RW;+2 for physical assistance   Toileting- Clothing Manipulation and Hygiene: Moderate assistance;+2 for physical assistance;Sitting/lateral lean;Sit to/from stand;Cueing for sequencing;Cueing for safety       Functional mobility during ADLs: Moderate assistance;+2 for physical assistance;Rolling walker;Cueing for safety General ADL Comments: Pt limited by [facility] status, decreased strength, decreased activity tolerance and decreased ability to care for self.     Vision Baseline Vision/History: Wears glasses Wears Glasses: At all times Patient Visual Report: Peripheral vision impairment(per neuro- L hemianopsia; poor peripheral vision L eye) Vision Assessment?: Vision impaired- to be further tested in functional context;Yes Eye Alignment: Impaired (comment)(able to scan to L, but unable to see item in periphery.) Ocular Range of Motion: Restricted on the left Tracking/Visual Pursuits: Impaired - to be further tested in functional context Additional Comments: Pt  cues required  to turn head to L due to visual impairment     Perception     Praxis      Pertinent Vitals/Pain Pain Assessment: No/denies pain     Hand Dominance Right   Extremity/Trunk Assessment Upper Extremity Assessment Upper Extremity Assessment: Generalized weakness;RUE deficits/detail;LUE deficits/detail RUE Deficits / Details: AROM, WFLs; strength is 3+/5 MM grade LUE Deficits / Details: AROM, WFLs; strength is 3+/5 MM grade   Lower Extremity Assessment Lower Extremity Assessment: Defer to PT evaluation;Generalized weakness RLE Deficits / Details: Decreased sensation from knees down to foot-premorbid, otherwise strength 4/5 RLE Sensation: decreased light touch LLE Deficits / Details: Decreased sensation from knees down to foot- premorbid; otherwise strength 4/5. LLE Sensation: decreased light touch   Cervical / Trunk Assessment Cervical / Trunk Assessment: Kyphotic   Communication Communication Communication: HOH   Cognition Arousal/Alertness: Awake/alert Behavior During Therapy: WFL for tasks assessed/performed Overall Cognitive Status: Impaired/Different from baseline Area of Impairment: Problem solving;Awareness;Safety/judgement                         Safety/Judgement: Decreased awareness of safety;Decreased awareness of deficits Awareness: Intellectual Problem Solving: Slow processing General Comments: HOH, slow processing. Poor awareness of deficits/safety.   General Comments  VSS on RA.    Exercises     Shoulder Instructions      Home Living Family/patient expects to be discharged to:: Private residence Living Arrangements: Alone Available Help at Discharge: Family;Available PRN/intermittently(reports ex wife, [name] or [name] (g/f) can help him at home) Type of Home: House Home Access: Stairs to enter [employer] of Steps: 3 Entrance Stairs-Rails: Right Home Layout: One level     Bathroom Shower/Tub: Radiographer, therapeutic: Standard     Home Equipment: None;Shower seat - built in          Prior Functioning/Environment Level of Independence: Independent        Comments: Lives with 2 dogs [place]. Does his own ADLs/IADLs, Working for a [employer]. Drives.        OT Problem List: Decreased activity tolerance;Impaired balance (sitting and/or standing);Impaired vision/perception;Decreased safety awareness;Pain      OT Treatment/Interventions: Self-care/ADL training;Therapeutic exercise;Neuromuscular education;Energy conservation;DME and/or AE instruction;Therapeutic activities;Cognitive remediation/compensation;Patient/family education;Balance training;Visual/perceptual remediation/compensation    OT Goals(Current goals can be found in the care plan section) Acute Rehab OT Goals Patient Stated Goal: asking about going home today OT Goal Formulation: With patient Time For Goal Achievement: 03/11/20 Potential to Achieve Goals: Good ADL Goals Pt Will Perform Lower Body Dressing: with min guard assist;sitting/lateral leans;sit to/from stand Pt/caregiver will Perform Home Exercise Program: Increased strength;Both right and left upper extremity;With Supervision Additional ADL Goal #1: Pt will participate in visual perceptual L visual field activities x5 mins to increase awareness and decrease risk of falls. Additional ADL Goal #2: Pt will state/utilize (3) fall risk strategies in order to improve prevention of falls and increase safety awareness with no cues after education.  OT Frequency: Min 2X/week   Barriers to D/C:            Co-evaluation PT/OT/SLP Co-Evaluation/Treatment: Yes Reason for Co-Treatment: Complexity of the patient's impairments (multi-system involvement);To address functional/ADL transfers   OT goals addressed during session: ADL's and self-care      AM-PAC OT "6 Clicks" Daily Activity     Outcome Measure Help from another person eating meals?: None Help  from another person taking care of personal grooming?: A Little Help  from another person toileting, which includes using toliet, bedpan, or urinal?: A Lot Help from another person bathing (including washing, rinsing, drying)?: A Lot Help from another person to put on and taking off regular upper body clothing?: A Little Help from another person to put on and taking off regular lower body clothing?: A Lot 6 Click Score: 16   End of Session Equipment Utilized During Treatment: Gait belt;Rolling walker Nurse Communication: Mobility status;Other (comment)(L visual field deficit)  Activity Tolerance: Patient limited by fatigue Patient left: in chair;with call bell/phone within reach;with chair alarm set  OT Visit Diagnosis: Unsteadiness on feet (R26.81);Muscle weakness (generalized) (M62.81)                Time: 1018-1050 OT Time Calculation (min): 32 min Charges:  OT General Charges $OT Visit: 1 Visit OT Evaluation $OT Eval Moderate Complexity: 1 Mod  Jefferey Pica, OTR/L Acute Rehabilitation Services Pager: [phone] Office: [phone]   Aiesha Leland C 02/26/2020, 4:20 PM

## 2020-02-26 NOTE — Progress Notes (Signed)
Called to pharmacy requesting Nexium on patient's behalf. We have medicine in hospital, non-formulary order has been sent.  D/C protonix   Wilber Oliphant, M.D.  8:38 AM 02/26/2020

## 2020-02-26 NOTE — ED Notes (Signed)
Pt returned from CT, re-connected to monitoring equipment.

## 2020-02-27 ENCOUNTER — Inpatient Hospital Stay (HOSPITAL_COMMUNITY): Payer: Medicare Other

## 2020-02-27 DIAGNOSIS — I63339 Cerebral infarction due to thrombosis of unspecified posterior cerebral artery: Secondary | ICD-10-CM

## 2020-02-27 LAB — GLUCOSE, CAPILLARY
Glucose-Capillary: 92 mg/dL (ref 70–99)
Glucose-Capillary: 82 mg/dL (ref 70–99)

## 2020-02-27 MED ORDER — CLOPIDOGREL BISULFATE 75 MG PO TABS
75.0000 mg | ORAL_TABLET | Freq: Every day | ORAL | Status: DC
  Administered 2020-02-28 – 2020-03-01 (×3): 75 mg via ORAL
  Filled 2020-02-27 (×3): qty 1

## 2020-02-27 NOTE — Progress Notes (Signed)
Inpatient Rehabilitation Admissions Coordinator  Inpatient rehab consult received. I met with patient at bedside for assessment. We discussed goals and expectations of a possible inpt rehab admit. He requests that I speak with his ex wife, Verneita Griffes. I contacted [name] and updated her on the rehab plan. I will begin insurance authorization with [facility] Medicare for a possible admit pending insurance approval and bed availability.  Danne Baxter, RN, MSN Rehab Admissions Coordinator [phone] 02/27/2020 12:37 PM

## 2020-02-27 NOTE — Social Work (Signed)
CSW met with pt at bedside. CSW introduced self and explained her role. CSW completed sbirt with pt.  Pt scored a 0 on the sbirt scale. Pt denied alcohol use. Pt denied substance use. Pt did not need resources at this time.  Emeterio Reeve, Latanya Presser, [place] Social Worker [phone]

## 2020-02-27 NOTE — Progress Notes (Signed)
Physical Therapy Treatment Patient Details Name: Christian [name] MRN: [id number] DOB: 1941-07-22 Today's Date: 02/27/2020    History of Present Illness Patient is a 79 y/o male who presents with "feeling foggy," dizziness and numbness in BLEs. Brain MRI- right PCA infarct. HEad CT- possible bleed. PMH includes neuropathy involving BLEs, DM, HTN, HLD, depression, anxiety,CAD and heart failure.    PT Comments    Patient is making progress toward PT goals and tolerated increased gait distance. Pt presents with impaired balance and requires mod A for gait training with +2 for safety. Pt impulsive during session and with decreased awareness of safety/deficits. Continue to recommend CIR for further skilled PT services to maximize independence and safety with mobility.    Follow Up Recommendations  CIR;Supervision for mobility/OOB;Supervision/Assistance - 24 hour     Equipment Recommendations  Other (comment)(defer to next venue)    Recommendations for Other Services       Precautions / Restrictions Precautions Precautions: Fall Precaution Comments: L hemianopsia Restrictions Weight Bearing Restrictions: No    Mobility  Bed Mobility Overal bed mobility: Needs Assistance Bed Mobility: Supine to Sit     Supine to sit: Min guard;HOB elevated     General bed mobility comments: + rail use, increased time; dizziness- resolves. VSS; negative for orthostatic BP sitting to standing  Transfers Overall transfer level: Needs assistance Equipment used: None Transfers: Sit to/from Stand Sit to Stand: Mod assist;Min assist         General transfer comment: increased assist needed upon standin due to impaired balance; cues for safety as pt attempting to stand several times from EOB despite cues to wait for therapist  Ambulation/Gait Ambulation/Gait assistance: +2 safety/equipment;Mod assist(chair follow ) Gait Distance (Feet): 50 Feet Assistive device: 1 person hand held assist(assist at  trunk with gait belt) Gait Pattern/deviations: Decreased step length - left;Decreased step length - right;Staggering left;Staggering right;Leaning posteriorly Gait velocity: decreased   General Gait Details: assistance required for balance and weight shifting; cues for increased bilat step lengths and decreased speed; pt moving very quickly and with lateral and posterior LOB    Stairs             Wheelchair Mobility    Modified Rankin (Stroke Patients Only) Modified Rankin (Stroke Patients Only) Pre-Morbid Rankin Score: No significant disability Modified Rankin: Moderately severe disability     Balance Overall balance assessment: History of Falls;Needs assistance Sitting-balance support: Feet supported;No upper extremity supported Sitting balance-Leahy Scale: Fair     Standing balance support: During functional activity Standing balance-Leahy Scale: Poor Standing balance comment: requires external support.                            Cognition Arousal/Alertness: Awake/alert Behavior During Therapy: WFL for tasks assessed/performed;Impulsive Overall Cognitive Status: Impaired/Different from baseline Area of Impairment: Problem solving;Awareness;Safety/judgement                         Safety/Judgement: Decreased awareness of safety;Decreased awareness of deficits   Problem Solving: Requires verbal cues General Comments: HOH. Poor awareness of deficits/safety.      Exercises      General Comments        Pertinent Vitals/Pain Pain Assessment: No/denies pain    Home Living                      Prior Function  PT Goals (current goals can now be found in the care plan section) Progress towards PT goals: Progressing toward goals    Frequency    Min 4X/week      PT Plan Current plan remains appropriate    Co-evaluation              AM-PAC PT "6 Clicks" Mobility   Outcome Measure  Help needed turning  from your back to your side while in a flat bed without using bedrails?: A Little Help needed moving from lying on your back to sitting on the side of a flat bed without using bedrails?: A Little Help needed moving to and from a bed to a chair (including a wheelchair)?: A Lot Help needed standing up from a chair using your arms (e.g., wheelchair or bedside chair)?: A Lot Help needed to walk in hospital room?: A Lot Help needed climbing 3-5 steps with a railing? : A Lot 6 Click Score: 14    End of Session Equipment Utilized During Treatment: Gait belt Activity Tolerance: Patient tolerated treatment well Patient left: in chair;with call bell/phone within reach;with chair alarm set Nurse Communication: Mobility status PT Visit Diagnosis: Muscle weakness (generalized) (M62.81);Unsteadiness on feet (R26.81);Difficulty in walking, not elsewhere classified (R26.2)     Time: 1351-1415 PT Time Calculation (min) (ACUTE ONLY): 24 min  Charges:  $Gait Training: 23-37 mins                     Christian [name], PTA Acute Rehabilitation Services Pager: [phone] Office: [phone]     Darliss Cheney 02/27/2020, 4:02 PM

## 2020-02-27 NOTE — Progress Notes (Signed)
STROKE TEAM PROGRESS NOTE   HISTORY OF PRESENT ILLNESS (per record) Christian [name] is a 79 y.o. male past history of chronic systolic heart failure, coronary artery disease, hypertension, hyperlipidemia, bilateral lower extremity neuropathy, diabetes, presenting to the emergency room with multiple complaints-most notable of which is feeling foggy while doing things. He cannot give me a clear last known well but says that over the past few days he has noticed that he is not been able to think correctly, as well as had difficulty identifying the roads that he is driven on for the past many years. He also complains of some numbness in his feet which has been a longstanding problem with no real exacerbation. In addition to feeling foggy, he is also felt a little dizzy-lightheaded and had some pain in the back of his head along with some increasing numbness in both of his lower extremities.  He reached out to one of his family members who is a physician out of state, who recommended that he come to the emergency room for urgent evaluation for potential acute neurological symptoms. He had seen his PCP yesterday, who had done some labs which were reportedly normal and had scheduled him for an outpatient MRI.  The family member recommended that he come to the ER and maybe get the imaging done sooner rather than later.  MR brain was done which showed patchy right PCA territory infarcts. Neurological consultation was obtained for further work-up recommendations. LKW: Unclear tpa given?: no, unclear and unknown last known well Premorbid modified Rankin scale (mRS): 1   INTERVAL HISTORY Patient states she is feeling better though he still has visual impairment on the left hand still with some dizziness.  Vital signs stable.  Neurological exam stable.   OBJECTIVE Vitals:   02/27/20 0802 02/27/20 1159 02/27/20 1400 02/27/20 1510  BP: 113/69 122/80 122/77 122/87  Pulse: 87 85  77  Resp: 18 16  18   Temp: 97.9  F (36.6 C) 98.9 F (37.2 C)  97.8 F (36.6 C)  TempSrc: Axillary Oral  Oral  SpO2: 95% 94%  99%  Weight:      Height:        CBC:  Recent Labs  Lab 02/24/20 1013 02/26/20 0132  WBC 6.3 7.9  NEUTROABS 3.8  --   HGB 15.4 14.8  HCT 46.1 46.3  MCV 89 91.0  PLT 202 0000000    Basic Metabolic Panel:  Recent Labs  Lab 02/24/20 1013 02/26/20 0132  NA 141 135  K 4.4 4.5  CL 105 103  CO2 22 22  GLUCOSE 116* 149*  BUN 17 16  CREATININE 0.94 0.95  CALCIUM 9.1 8.9    Lipid Panel:     Component Value Date/Time   CHOL 142 02/24/2020 1013   TRIG 114 02/24/2020 1013   HDL 47 02/24/2020 1013   CHOLHDL 3.0 02/24/2020 1013   CHOLHDL 4 07/08/2017 1212   VLDL 21.8 07/08/2017 1212   LDLCALC 74 02/24/2020 1013   HgbA1c:  Lab Results  Component Value Date   HGBA1C 6.4 (A) 02/24/2020   Urine Drug Screen: No results found for: LABOPIA, COCAINSCRNUR, LABBENZ, AMPHETMU, THCU, LABBARB  Alcohol Level No results found for: ETH  IMAGING  CT ANGIO HEAD W OR WO CONTRAST 02/26/2020 IMPRESSION:   CT HEAD  IMPRESSION:  1. Patchy evolving acute right PCA territory infarct, stable from previous MRA.  2. Patchy hyperdensity involving the right parietal lobe, suspicious for possible small volume subarachnoid hemorrhage. While laminar necrosis  could conceivably have this appearance as well, there appears to be some FLAIR signal abnormality within the cortical sulci on previous brain MRI, favoring hemorrhage.  3. Underlying atrophy with chronic small vessel ischemic disease.   CTA HEAD AND NECK  CT ANGIO NECK W OR WO CONTRAST IMPRESSION:  1. Negative CTA for emergent large vessel occlusion.  2. Severe stenosis of the proximal-mid right P2 segment, with markedly attenuated flow within the right PCA distally.  3. Moderate to severe proximal left P2 stenosis.  4. Hypoplastic right vertebral artery largely terminates in PICA. Moderate atheromatous irregularity throughout the dominant left V4  segment without high-grade stenosis.  5. Atheromatous plaque about the carotid bifurcations with associated stenosis of up to 40% on the right and 50% on the left.  6. 50% stenosis at the origin of the right subclavian artery.  7.  Emphysema (ICD10-J43.9).  8. 12 mm left thyroid nodule, of doubtful significance given size and patient age. No follow-up imaging recommended. (Ref: J Am Coll Radiol. 2015 Feb;12(2): 143-50).   MR Brain W and Wo Contrast 02/25/2020 IMPRESSION:  1. Patchy acute infarct in the right PCA territory.  2. Remote left occipital cortex infarct and remote bilateral superficial occipital hemorrhage.  3. Abnormal or absent flow in the non dominant right vertebral artery    ECHOCARDIOGRAM COMPLETE 02/26/2020 IMPRESSIONS   1. Left ventricular ejection fraction, by estimation, is 60 to 65%. The left ventricle has normal function. The left ventricle has no regional wall motion abnormalities. Left ventricular diastolic function could not be evaluated.   2. Right ventricular systolic function is normal. The right ventricular size is normal.   3. The mitral valve is grossly normal. No evidence of mitral valve regurgitation. No evidence of mitral stenosis.   4. The aortic valve is tricuspid. Aortic valve regurgitation is not visualized. Mild aortic valve sclerosis is present, with no evidence of aortic valve stenosis.   5. Aortic dilatation noted. There is moderate dilatation of the ascending aorta measuring 42 mm.       ECG - pending   PHYSICAL EXAM Blood pressure 122/87, pulse 77, temperature 97.8 F (36.6 C), temperature source Oral, resp. rate 18, height 5\' 6"  (1.676 m), weight 74.8 kg, SpO2 99 %. GENERAL: He is some discomfort but no acute distress.  HEENT: He closes the left eye but the strength is normal.  ABDOMEN: soft  EXTREMITIES: No edema   BACK: Normal  SKIN: Normal by inspection.    MENTAL STATUS: He is sleeping but easily arousable.  He is mostly lucid  and coherent.  No dysarthria is noted.  CRANIAL NERVES: Pupils are equal, round and reactive to light and accomodation; extra ocular movements are full, there is no significant nystagmus; visual fields a dense left homonymous hemianopia; upper and lower facial muscles are normal in strength and symmetric, there is no flattening of the nasolabial folds; tongue is midline  MOTOR: Normal tone, bulk and strength; no pronator drift.  COORDINATION: Left finger to nose is normal, right finger to nose is normal, No rest tremor; no intention tremor; no postural tremor; no bradykinesia.  REFLEXES: Deep tendon reflexes are symmetrical and normal.   SENSATION: Normal to light touch, temperature, and pain.        ASSESSMENT/PLAN [name] is a 79 y.o. male with history of chronic systolic heart failure, coronary artery disease, hypertension, hyperlipidemia, bilateral lower extremity neuropathy, diabetes, presenting to the emergency room with multiple complaints-most notable of which is feeling foggy while  doing things, pain in the back of his head and light headed or dizzy. He did not receive IV t-PA due to unclear time of onset.  Stroke: Patchy acute infarct in the right PCA territory. Remote left occipital cortex infarct and remote bilateral superficial occipital hemorrhage.  Due to intracranial atherosclerosis  Resultant dense left homonymous hemianopia.  Code Stroke CT Head -      CT head - Patchy evolving acute right PCA territory infarct. Patchy hyperdensity involving the right parietal lobe, suspicious for possible small volume subarachnoid hemorrhage.   MRI head - Patchy acute infarct in the right PCA territory. Remote left occipital cortex infarct and remote bilateral superficial occipital hemorrhage.   MRA head - not ordered  CTA H&N - Severe stenosis of the proximal-mid right P2 segment, with markedly attenuated flow within the right PCA distally. Moderate to severe proximal  left P2 stenosis.   CT Perfusion - not ordered  Carotid Doppler - CTA neck performed - carotid dopplers not indicated.  2D Echo - EF 60 - 65%. No cardiac source of emboli identified.   Sars Corona Virus 2 - negative  LDL - 74  HgbA1c - 6.4  UDS - not ordered  VTE prophylaxis - none Diet  Diet Order            Diet Carb Modified Fluid consistency: Thin; Room service appropriate? No  Diet effective now              aspirin 81 mg daily prior to admission, now on aspirin 81 mg daily  Patient counseled to be compliant with his antithrombotic medications  Ongoing aggressive stroke risk factor management  Therapy recommendations:  pending  Disposition:  Pending  Hypertension  Home BP meds: Coreg ; Entresto  Current BP meds: Coreg ; Entresto  Stable . Long-term BP goal normotensive  Hyperlipidemia  Home Lipid lowering medication: Crestor 5 mg daily  LDL 74, goal < 70  Current lipid lowering medication: Crestor 5 mg daily  Continue statin at discharge  Lipitor intolerance  Diabetes  Home diabetic meds: none  Current diabetic meds: SSI  HgbA1c 6.4, goal < 7.0 Recent Labs    02/27/20 1157  GLUCAP 92    Other Stroke Risk Factors  Advanced age  Family hx stroke (mother and father)  Coronary artery disease  Congestive Heart Failure  Other Active Problems  Code status - Limited  Emphysema (ICD10-J43.9)   12 mm left thyroid nodule, of doubtful significance given size and patient age. No follow-up imaging recommended   Aortic dilatation noted. There is moderate dilatation of the ascending aorta measuring 42 mm.    Hospital day # 1  Acute right PCA infarct with a left homonymous hemianopia on aspirin.  Recommend aspirin 81 mg daily and Plavix 75 mg daily for 3 months followed by aspirin alone.  Aggressive risk factor modification.  Transfer to inpatient rehab when bed available. Greater than 50% time during this 25-minute visit was spent on  counseling and coordination of care and discussion with care team.  Stroke team will sign off.  Kindly call for questions.  Follow-up as an outpatient stroke clinic in 6 weeks. Antony Contras, MD To contact Stroke Continuity provider, please refer to [email or web address] After hours, contact General Neurology

## 2020-02-27 NOTE — Progress Notes (Signed)
Family Medicine Teaching Service Daily Progress Note Intern Pager: [phone]  Patient name: Christian [name] Medical record number: [id number] Date of birth: 10/29/1940 Age: 79 y.o. Gender: male  Primary Care Provider: Maximiano Coss, NP Consultants: Neurology Code Status: Partial   Pt Overview and Major Events to Date:  5/09: Admitted  Assessment and Plan:  Acute right PCA CVA   CTA head: Stable right PCA territory infarct involving the right occipital lobe. Stable petechial hemorrhage within the right parietooccipital sulci just superior to the infarct bed  Aspirin 81mg   Rosuvastatin 5 mg  Follow up neuro recs  Cardiac monitoring  Continuous pulse ox  PT/OT/speech  Evaluated by CIR, who feel he is a good candidate  Type 2 diabetes Glucose 92 am  Diet controlled.  Most recent A1c was 6.4. -Monitor blood glucose with BMPs -When allowed to eat, carb modified diet  Hyperlipidemia Most recent lipid panel with LDL 74. -Continue rosuvastatin 5 mg -Will speak to patient about switching to high dose rosuvastatin every other day to avoid myalgia symptoms   CAD Home meds: 9.375mg  carvedilol BID, aspirin 81mg  once daily -Aspirin 81 mg once daily  -Continue home carvedilol  Essential hypertension BP 125/81 -Continue carvedilol -Monitor blood pressures  HFrEF Echo on 5/9: EF 60-65% Home meds: Entresto 24-26mg  BID, Lasix 20 mg 3 times daily -Continue home Entresto and Lasix  Lower extremity neuropathy Chronic.  Unsure if related to diabetic neuropathy or other cause.  It is bilateral.  Patient endorsing worsening due to recent CVA.  GERD Takes omeprazole at home -Continue PPI  FEN/GI: Carb modified diet, Nexium  PPx: SCDs  Disposition:  CIR   Subjective:  Doing well denies concerns this morning.   Objective: Temp:  [97.7 F (36.5 C)-98.6 F (37 C)] 97.7 F (36.5 C) (05/10 0420) Pulse Rate:  [73-88] 85 (05/10 0500) Resp:  [15-20] 17 (05/10  0500) BP: (112-132)/(67-96) 125/81 (05/10 0420) SpO2:  [93 %-98 %] 93 % (05/10 0500)   Physical Exam: General: Well appearing 79 yr old male, no acute distress  Cardiovascular: S1 and S2, RRR Respiratory: CTAB, normal WOB  Abdomen: Abdomen soft non tender, bowel sounds present  Extremities: no peripheral edema.   Laboratory: Recent Labs  Lab 02/24/20 1013 02/26/20 0132  WBC 6.3 7.9  HGB 15.4 14.8  HCT 46.1 46.3  PLT 202 204   Recent Labs  Lab 02/24/20 1013 02/26/20 0132  NA 141 135  K 4.4 4.5  CL 105 103  CO2 22 22  BUN 17 16  CREATININE 0.94 0.95  CALCIUM 9.1 8.9  PROT 6.5 6.5  BILITOT 0.9 1.4*  ALKPHOS 50 42  ALT 13 17  AST 16 18  GLUCOSE 116* 149*     Imaging/Diagnostic Tests: CT ANGIO HEAD W OR WO CONTRAST  Result Date: 02/26/2020 CLINICAL DATA:  Follow-up examination for acute stroke. EXAM: CT ANGIOGRAPHY HEAD AND NECK TECHNIQUE: Multidetector CT imaging of the head and neck was performed using the standard protocol during bolus administration of intravenous contrast. Multiplanar CT image reconstructions and MIPs were obtained to evaluate the vascular anatomy. Carotid stenosis measurements (when applicable) are obtained utilizing NASCET criteria, using the distal internal carotid diameter as the denominator. CONTRAST:  143mL OMNIPAQUE IOHEXOL 350 MG/ML SOLN COMPARISON:  Prior MRI from 02/25/2020. FINDINGS: CT HEAD FINDINGS Brain: Age-related cerebral atrophy with chronic small vessel ischemic disease. Evolving cytotoxic edema within the right occipital lobe compatible with previously identified acute right PCA territory infarct. No associated mass effect. No other acute  large vessel territory infarct. Mild hyperdensity seen involving the right parieto-occipital region, just superior to the area of infarction is seen, suspicious for small volume subarachnoid hemorrhage (series 5, image 19). No other acute intracranial hemorrhage. No mass lesion or midline shift. No  hydrocephalus or extra-axial fluid collection. Vascular: No asymmetric hyperdense vessel. Calcified atherosclerosis seen about the skull base. Skull: Scalp soft tissues and calvarium within normal limits. Sinuses: Paranasal sinuses and mastoid air cells are clear. Orbits: Globes and orbital soft tissues demonstrate no acute finding. Review of the MIP images confirms the above findings CTA NECK FINDINGS Aortic arch: Visualized aortic arch of normal caliber with normal branch pattern. Mild-to-moderate atheromatous change about the aortic arch and origin of the great vessels without hemodynamically significant stenosis. Atheromatous plaque at the origin of the right subclavian artery with associated stenosis of up to 50% (series 12, image 195). Visualized subclavian arteries otherwise widely patent. Right carotid system: Scattered plaque within the mid-distal right CCA without significant stenosis. Bulky calcified plaque about the right bifurcation/proximal right ICA with associated stenosis of up to 40% by NASCET criteria. Right ICA tortuous but otherwise patent distally to the skull base. Left carotid system: Left common carotid artery patent from its origin to the bifurcation without stenosis. Bulky calcified plaque about the left bifurcation with associated stenosis of up to 50% by NASCET criteria. Left ICA tortuous but otherwise patent to the skull base. Vertebral arteries: Both vertebral arteries arise from the subclavian arteries. Left vertebral artery dominant with a diffusely hypoplastic right vertebral artery. Left vertebral artery tortuous proximally. Vertebral arteries patent within the neck without stenosis, dissection, or occlusion. Skeleton: No acute osseous abnormality. No discrete or worrisome osseous lesions. Multilevel cervical spondylosis without high-grade stenosis. Other neck: No other acute soft tissue abnormality within the neck. 12 mm left thyroid nodule noted. No other mass lesion or  adenopathy. Upper chest: Visualized upper chest demonstrates no acute finding. Centrilobular emphysema. Review of the MIP images confirms the above findings CTA HEAD FINDINGS Anterior circulation: Petrous segments patent bilaterally. Scattered atheromatous plaque within the cavernous/supraclinoid ICAs, right slightly worse than left. Relatively mild multifocal narrowing on the left. Short-segment moderate approximate 50% stenosis at the supraclinoid right ICA (series 11, image 119). A1 segments patent bilaterally. Normal anterior communicating artery complex. Anterior cerebral arteries patent to their distal aspects without stenosis. Diffuse atheromatous irregularity throughout the M1 segments with associated mild multifocal narrowing. Normal MCA bifurcations. Distal MCA branches well perfused although demonstrate diffuse small vessel atheromatous irregularity. Posterior circulation: Scattered atheromatous plaque throughout the dominant left V4 segment without high-grade stenosis. Left PICA patent. Hypoplastic right vertebral artery largely terminates in PICA, and then subsequently occludes. Some retrograde filling into the distal right V4 segment across the vertebrobasilar junction. Right PICA patent. Basilar demonstrates mild atheromatous irregularity but is widely patent to its distal aspect. Superior cerebral arteries patent bilaterally. Both PCAs primarily supplied via the basilar. Diffuse moderate to severe stenosis of the proximal left P2 segment. On the right, there is severe stenosis of the proximal-mid right P2 segment (series 14, image 24). Right PCA is markedly attenuated but grossly patent distally. Venous sinuses: Not well assessed due to timing the contrast bolus. Anatomic variants: Hypoplastic right vertebral artery largely terminating in PICA. Review of the MIP images confirms the above findings IMPRESSION: CT HEAD IMPRESSION: 1. Patchy evolving acute right PCA territory infarct, stable from  previous MRA. 2. Patchy hyperdensity involving the right parietal lobe, suspicious for possible small volume subarachnoid hemorrhage. While laminar  necrosis could conceivably have this appearance as well, there appears to be some FLAIR signal abnormality within the cortical sulci on previous brain MRI, favoring hemorrhage. 3. Underlying atrophy with chronic small vessel ischemic disease. CTA HEAD AND NECK IMPRESSION: 1. Negative CTA for emergent large vessel occlusion. 2. Severe stenosis of the proximal-mid right P2 segment, with markedly attenuated flow within the right PCA distally. 3. Moderate to severe proximal left P2 stenosis. 4. Hypoplastic right vertebral artery largely terminates in PICA. Moderate atheromatous irregularity throughout the dominant left V4 segment without high-grade stenosis. 5. Atheromatous plaque about the carotid bifurcations with associated stenosis of up to 40% on the right and 50% on the left. 6. 50% stenosis at the origin of the right subclavian artery. 7.  Emphysema (ICD10-J43.9). 8. 12 mm left thyroid nodule, of doubtful significance given size and patient age. No follow-up imaging recommended. (Ref: J Am Coll Radiol. 2015 Feb;12(2): 143-50). Critical Value/emergent results were called by telephone at the time of interpretation on 02/26/2020 at 2:33 am to provider Dr. Rory Percy, who verbally acknowledged these results. Electronically Signed   By: Jeannine Boga M.D.   On: 02/26/2020 02:39   CT HEAD WO CONTRAST  Result Date: 02/27/2020 CLINICAL DATA:  Diabetes, hypertension, stroke EXAM: CT HEAD WITHOUT CONTRAST TECHNIQUE: Contiguous axial images were obtained from the base of the skull through the vertex without intravenous contrast. COMPARISON:  02/26/2020, 02/25/2020 FINDINGS: Brain: Continued evolution of the right occipital infarct seen previously. The subtle hyperdensity within the sulci in the right parietooccipital region just superior to the infarct bed again identified,  consistent with petechial hemorrhage and unchanged since prior exam. There are stable hypodensities throughout the periventricular white matter consistent with chronic small vessel ischemic changes. Lateral ventricles and midline structures are stable. No acute extra-axial fluid collections. No mass effect. Vascular: No hyperdense vessel or unexpected calcification. Skull: Normal. Negative for fracture or focal lesion. Sinuses/Orbits: No acute finding. Other: None. IMPRESSION: 1. Stable right PCA territory infarct involving the right occipital lobe. 2. Stable petechial hemorrhage within the right parietooccipital sulci just superior to the infarct bed. Electronically Signed   By: Randa Ngo M.D.   On: 02/27/2020 02:53   CT ANGIO NECK W OR WO CONTRAST  Result Date: 02/26/2020 CLINICAL DATA:  Follow-up examination for acute stroke. EXAM: CT ANGIOGRAPHY HEAD AND NECK TECHNIQUE: Multidetector CT imaging of the head and neck was performed using the standard protocol during bolus administration of intravenous contrast. Multiplanar CT image reconstructions and MIPs were obtained to evaluate the vascular anatomy. Carotid stenosis measurements (when applicable) are obtained utilizing NASCET criteria, using the distal internal carotid diameter as the denominator. CONTRAST:  193mL OMNIPAQUE IOHEXOL 350 MG/ML SOLN COMPARISON:  Prior MRI from 02/25/2020. FINDINGS: CT HEAD FINDINGS Brain: Age-related cerebral atrophy with chronic small vessel ischemic disease. Evolving cytotoxic edema within the right occipital lobe compatible with previously identified acute right PCA territory infarct. No associated mass effect. No other acute large vessel territory infarct. Mild hyperdensity seen involving the right parieto-occipital region, just superior to the area of infarction is seen, suspicious for small volume subarachnoid hemorrhage (series 5, image 19). No other acute intracranial hemorrhage. No mass lesion or midline shift. No  hydrocephalus or extra-axial fluid collection. Vascular: No asymmetric hyperdense vessel. Calcified atherosclerosis seen about the skull base. Skull: Scalp soft tissues and calvarium within normal limits. Sinuses: Paranasal sinuses and mastoid air cells are clear. Orbits: Globes and orbital soft tissues demonstrate no acute finding. Review of the MIP images  confirms the above findings CTA NECK FINDINGS Aortic arch: Visualized aortic arch of normal caliber with normal branch pattern. Mild-to-moderate atheromatous change about the aortic arch and origin of the great vessels without hemodynamically significant stenosis. Atheromatous plaque at the origin of the right subclavian artery with associated stenosis of up to 50% (series 12, image 195). Visualized subclavian arteries otherwise widely patent. Right carotid system: Scattered plaque within the mid-distal right CCA without significant stenosis. Bulky calcified plaque about the right bifurcation/proximal right ICA with associated stenosis of up to 40% by NASCET criteria. Right ICA tortuous but otherwise patent distally to the skull base. Left carotid system: Left common carotid artery patent from its origin to the bifurcation without stenosis. Bulky calcified plaque about the left bifurcation with associated stenosis of up to 50% by NASCET criteria. Left ICA tortuous but otherwise patent to the skull base. Vertebral arteries: Both vertebral arteries arise from the subclavian arteries. Left vertebral artery dominant with a diffusely hypoplastic right vertebral artery. Left vertebral artery tortuous proximally. Vertebral arteries patent within the neck without stenosis, dissection, or occlusion. Skeleton: No acute osseous abnormality. No discrete or worrisome osseous lesions. Multilevel cervical spondylosis without high-grade stenosis. Other neck: No other acute soft tissue abnormality within the neck. 12 mm left thyroid nodule noted. No other mass lesion or  adenopathy. Upper chest: Visualized upper chest demonstrates no acute finding. Centrilobular emphysema. Review of the MIP images confirms the above findings CTA HEAD FINDINGS Anterior circulation: Petrous segments patent bilaterally. Scattered atheromatous plaque within the cavernous/supraclinoid ICAs, right slightly worse than left. Relatively mild multifocal narrowing on the left. Short-segment moderate approximate 50% stenosis at the supraclinoid right ICA (series 11, image 119). A1 segments patent bilaterally. Normal anterior communicating artery complex. Anterior cerebral arteries patent to their distal aspects without stenosis. Diffuse atheromatous irregularity throughout the M1 segments with associated mild multifocal narrowing. Normal MCA bifurcations. Distal MCA branches well perfused although demonstrate diffuse small vessel atheromatous irregularity. Posterior circulation: Scattered atheromatous plaque throughout the dominant left V4 segment without high-grade stenosis. Left PICA patent. Hypoplastic right vertebral artery largely terminates in PICA, and then subsequently occludes. Some retrograde filling into the distal right V4 segment across the vertebrobasilar junction. Right PICA patent. Basilar demonstrates mild atheromatous irregularity but is widely patent to its distal aspect. Superior cerebral arteries patent bilaterally. Both PCAs primarily supplied via the basilar. Diffuse moderate to severe stenosis of the proximal left P2 segment. On the right, there is severe stenosis of the proximal-mid right P2 segment (series 14, image 24). Right PCA is markedly attenuated but grossly patent distally. Venous sinuses: Not well assessed due to timing the contrast bolus. Anatomic variants: Hypoplastic right vertebral artery largely terminating in PICA. Review of the MIP images confirms the above findings IMPRESSION: CT HEAD IMPRESSION: 1. Patchy evolving acute right PCA territory infarct, stable from  previous MRA. 2. Patchy hyperdensity involving the right parietal lobe, suspicious for possible small volume subarachnoid hemorrhage. While laminar necrosis could conceivably have this appearance as well, there appears to be some FLAIR signal abnormality within the cortical sulci on previous brain MRI, favoring hemorrhage. 3. Underlying atrophy with chronic small vessel ischemic disease. CTA HEAD AND NECK IMPRESSION: 1. Negative CTA for emergent large vessel occlusion. 2. Severe stenosis of the proximal-mid right P2 segment, with markedly attenuated flow within the right PCA distally. 3. Moderate to severe proximal left P2 stenosis. 4. Hypoplastic right vertebral artery largely terminates in PICA. Moderate atheromatous irregularity throughout the dominant left V4 segment without high-grade  stenosis. 5. Atheromatous plaque about the carotid bifurcations with associated stenosis of up to 40% on the right and 50% on the left. 6. 50% stenosis at the origin of the right subclavian artery. 7.  Emphysema (ICD10-J43.9). 8. 12 mm left thyroid nodule, of doubtful significance given size and patient age. No follow-up imaging recommended. (Ref: J Am Coll Radiol. 2015 Feb;12(2): 143-50). Critical Value/emergent results were called by telephone at the time of interpretation on 02/26/2020 at 2:33 am to provider Dr. Rory Percy, who verbally acknowledged these results. Electronically Signed   By: Jeannine Boga M.D.   On: 02/26/2020 02:39   MR Brain W and Wo Contrast  Result Date: 02/25/2020 CLINICAL DATA:  Dizziness and confusion EXAM: MRI HEAD WITHOUT AND WITH CONTRAST TECHNIQUE: Multiplanar, multiecho pulse sequences of the brain and surrounding structures were obtained without and with intravenous contrast. CONTRAST:  7.59mL GADAVIST GADOBUTROL 1 MMOL/ML IV SOLN COMPARISON:  12/16/2014 FINDINGS: Brain: Patchy restricted diffusion in the right occipital and medial temporal cortex, including along the hippocampus. Minimal  stippled restricted diffusion in the right thalamus. No acute hemorrhage, hydrocephalus, or masslike finding. Chronic blood products along the bilateral occipital cortex. Two remote white matter insults the left cerebral hemisphere and a remote left thalamic microhemorrhage. Mild remote cortical infarct along the left occipital convexity. Moderate chronic small vessel ischemia in the white matter. Vascular: Absent flow in the non dominant right vertebral artery by T2 weighted imaging. Skull and upper cervical spine: Negative for marrow lesion. Sinuses/Orbits: Negative IMPRESSION: 1. Patchy acute infarct in the right PCA territory. 2. Remote left occipital cortex infarct and remote bilateral superficial occipital hemorrhage. 3. Abnormal or absent flow in the non dominant right vertebral artery Electronically Signed   By: Monte Fantasia M.D.   On: 02/25/2020 22:30   ECHOCARDIOGRAM COMPLETE  Result Date: 02/26/2020    ECHOCARDIOGRAM REPORT   Patient Name:   Christian [name] Date of Exam: 02/26/2020 Medical Rec #:  [id number]     Height:       66.0 in Accession #:    [id number]    Weight:       165.0 lb Date of Birth:  [birth year]     BSA:          1.843 m Patient Age:    71 years      BP:           102/68 mmHg Patient Gender: M             HR:           91 bpm. Exam Location:  Inpatient Procedure: 2D Echo Indications:    CVA  History:        Patient has prior history of Echocardiogram examinations, most                 recent 08/04/2018. CAD; Risk Factors:Hypertension, Diabetes and                 Dyslipidemia.  Sonographer:    Johny Chess Referring Phys: [id number] BRIAN MILLER  Sonographer Comments: Technically difficult study due to poor echo windows. IMPRESSIONS  1. Left ventricular ejection fraction, by estimation, is 60 to 65%. The left ventricle has normal function. The left ventricle has no regional wall motion abnormalities. Left ventricular diastolic function could not be evaluated.  2. Right ventricular  systolic function is normal. The right ventricular size is normal.  3. The mitral valve is grossly normal. No evidence of mitral valve regurgitation. No evidence  of mitral stenosis.  4. The aortic valve is tricuspid. Aortic valve regurgitation is not visualized. Mild aortic valve sclerosis is present, with no evidence of aortic valve stenosis.  5. Aortic dilatation noted. There is moderate dilatation of the ascending aorta measuring 42 mm. FINDINGS  Left Ventricle: Left ventricular ejection fraction, by estimation, is 60 to 65%. The left ventricle has normal function. The left ventricle has no regional wall motion abnormalities. The left ventricular internal cavity size was normal in size. There is  no left ventricular hypertrophy. Left ventricular diastolic function could not be evaluated. Right Ventricle: The right ventricular size is normal. No increase in right ventricular wall thickness. Right ventricular systolic function is normal. Left Atrium: Left atrial size was normal in size. Right Atrium: Right atrial size was not well visualized. Pericardium: Trivial pericardial effusion is present. Mitral Valve: The mitral valve is grossly normal. No evidence of mitral valve regurgitation. No evidence of mitral valve stenosis. Tricuspid Valve: The tricuspid valve is normal in structure. Tricuspid valve regurgitation is trivial. No evidence of tricuspid stenosis. Aortic Valve: The aortic valve is tricuspid. . There is mild thickening and mild calcification of the aortic valve. Aortic valve regurgitation is not visualized. Mild aortic valve sclerosis is present, with no evidence of aortic valve stenosis. There is mild thickening of the aortic valve. There is mild calcification of the aortic valve. Pulmonic Valve: The pulmonic valve was grossly normal. Pulmonic valve regurgitation is trivial. No evidence of pulmonic stenosis. Aorta: Aortic dilatation noted. There is moderate dilatation of the ascending aorta measuring 42  mm. IAS/Shunts: The atrial septum is grossly normal.  LEFT VENTRICLE PLAX 2D LVIDd:         4.10 cm LVIDs:         2.70 cm LV PW:         1.20 cm LV IVS:        1.10 cm LVOT diam:     2.10 cm LV SV:         39 LV SV Index:   21 LVOT Area:     3.46 cm  LEFT ATRIUM           Index LA diam:      4.20 cm 2.28 cm/m LA Vol (A4C): 43.9 ml 23.82 ml/m  AORTIC VALVE LVOT Vmax:   61.80 cm/s LVOT Vmean:  39.800 cm/s LVOT VTI:    0.112 m  AORTA Ao Root diam: 3.60 cm Ao Asc diam:  4.20 cm MV E velocity: 79.50 cm/s MV A velocity: 125.00 cm/s  SHUNTS MV E/A ratio:  0.64         Systemic VTI:  0.11 m                             Systemic Diam: 2.10 cm Mertie Moores MD Electronically signed by Mertie Moores MD Signature Date/Time: 02/26/2020/12:54:14 PM    Final     Lattie Haw, MD 02/27/2020, 7:32 AM PGY-1, [place] Intern pager: [phone], text pages welcome

## 2020-02-27 NOTE — Plan of Care (Signed)
Patient alert and oriented but very forgetful.  Continue to need reinforcement with care plans and educations.   Problem: Education: Goal: Knowledge of disease or condition will improve Outcome: Progressing Goal: Knowledge of secondary prevention will improve Outcome: Progressing Goal: Knowledge of patient specific risk factors addressed and post discharge goals established will improve Outcome: Progressing   Problem: Coping: Goal: Will verbalize positive feelings about self Outcome: Progressing Goal: Will identify appropriate support needs Outcome: Progressing   Problem: Health Behavior/Discharge Planning: Goal: Ability to manage health-related needs will improve Outcome: Progressing   Problem: Self-Care: Goal: Ability to participate in self-care as condition permits will improve Outcome: Progressing Goal: Verbalization of feelings and concerns over difficulty with self-care will improve Outcome: Progressing Goal: Ability to communicate needs accurately will improve Outcome: Progressing   Problem: Nutrition: Goal: Risk of aspiration will decrease Outcome: Progressing   Problem: Intracerebral Hemorrhage Tissue Perfusion: Goal: Complications of Intracerebral Hemorrhage will be minimized Outcome: Progressing   Problem: Spontaneous Subarachnoid Hemorrhage Tissue Perfusion: Goal: Complications of Spontaneous Subarachnoid Hemorrhage will be minimized Outcome: Progressing   Problem: Nutrition: Goal: Dietary intake will improve Outcome: Not Progressing Note: Patient with very poor oral intake.

## 2020-02-27 NOTE — Consult Note (Signed)
Physical Medicine and Rehabilitation Consult Reason for Consult: Decreased functional mobility with dizziness Referring Physician: Internal medicine   HPI: Christian [name] is a 79 y.o. right-handed male with history of chronic systolic congestive heart failure, CAD/nonischemic cardiomyopathy maintained on aspirin, hypertension, diabetes mellitus.  Per chart review patient lives alone.  1 level home 3 steps to entry.  Works for a [employer].  He has an ex-wife as well as a girlfriend who he says can assist.  Presented 02/26/2020 with dizziness/lightheaded and increasing numbness bilateral lower extremities.  MRI showed patchy acute infarct in the right PCA territory.  Remote left occipital cortex infarct and remote bilateral superficial occipital hemorrhage.  CT angiogram head and neck negative for emergent large vessel occlusion.  Echocardiogram with ejection fraction of 65% no wall motion abnormalities.  Patient did not receive TPA.  Admission chemistries unremarkable.  A follow-up CT scan was completed 02/27/2020 showing stable right PCA territory infarct and small petechial hemorrhage within the right parieto-occipital sulci just superior to the infarct bed.  Currently maintained on low-dose aspirin for CVA prophylaxis.  Tolerating regular diet.  Therapy evaluations completed with recommendations of physical medicine rehab consult.   Review of Systems  Constitutional: Negative for chills and fever.  HENT: Negative for hearing loss.   Eyes: Negative for blurred vision and double vision.  Respiratory: Negative for cough and shortness of breath.   Cardiovascular: Positive for leg swelling. Negative for chest pain and palpitations.  Gastrointestinal: Positive for constipation. Negative for heartburn, nausea and vomiting.       GERD  Genitourinary: Negative for dysuria, flank pain and hematuria.  Musculoskeletal: Positive for myalgias.  Skin: Negative for rash.  Neurological: Positive for  sensory change.  Psychiatric/Behavioral: Positive for depression. The patient has insomnia.        Anxiety  All other systems reviewed and are negative.  Past Medical History:  Diagnosis Date  . Allergic rhinitis due to pollen 03/29/2018  . Chronic systolic heart failure (HCC) - EF improved to normal on Rx    Echo 11/18: Mild LVH, EF 20, diffuse HK mild AI, a sending aorta 42 mm (mildly dilated), mild MR, mild TR, trivial PI, PASP 44 // Echo 3/19: mild LVH, EF 25-30, diff HK, Gr 1 DD, asc aorta 43 mm (mildly dilated), MAC, mild reduced RVSF // Limited echo 10/19: Mild concentric LVH, EF 80-32, grade 1 diastolic dysfunction, MAC, mild LAE   . Coronary artery disease involving native coronary artery of native heart without angina pectoris 11/11/2017   R/L HC 11/18: pLAD 45, oD1 65 ; elevated PCWP (mean 29), moderate pulmonary hypertension, CO 3.66, CI 2.07  . Depression with anxiety 07/08/2017  . Essential hypertension 07/08/2017  . GERD with esophagitis 01/11/2018  . Hernia of abdominal cavity   . Hyperlipidemia   . Hyperlipidemia LDL goal <130 05/25/2017   The 10-year ASCVD risk score Mikey Bussing DC Jr., et al., 2013) is: 31.1%   Values used to calculate the score:     Age: 66 years     Sex: Male     Is Non-Hispanic African American: No     Diabetic: No     Tobacco smoker: No     Systolic Blood Pressure: 122 mmHg     Is BP treated: No     HDL Cholesterol: 68.7 mg/dL     Total Cholesterol: 258 mg/dL   . Idiopathic inflammatory myopathy 05/25/2017  . Neuropathy involving both lower extremities 05/25/2017  . Nonischemic  cardiomyopathy ([place]) 11/11/2017   LHC 09/16/17-nonobstructive CAD  . Oropharyngeal dysphagia 01/11/2018  . Type 2 diabetes mellitus with complication, without long-term current use of insulin ([place]) 07/08/2017   Past Surgical History:  Procedure Laterality Date  . BACK SURGERY    . HERNIA REPAIR    . RIGHT/LEFT HEART CATH AND CORONARY ANGIOGRAPHY N/A 09/16/2017   Procedure: RIGHT/LEFT HEART  CATH AND CORONARY ANGIOGRAPHY;  Surgeon: Martinique, Peter M, MD;  Location: [facility];  Service: Cardiovascular;  Laterality: N/A;  . SPINE SURGERY     Family History  Problem Relation Age of Onset  . Stroke Mother   . Kidney disease Father   . Stroke Father   . Hypertension Father   . Early death Father   . Cancer Neg Hx   . Alcohol abuse Neg Hx   . Diabetes Neg Hx   . Heart disease Neg Hx   . Hyperlipidemia Neg Hx    Social History:  reports that he has never smoked. He has never used smokeless tobacco. He reports that he does not drink alcohol or use drugs. Allergies:  Allergies  Allergen Reactions  . Amoxicillin Hives  . Bactrim [Sulfamethoxazole-Trimethoprim] Other (See Comments)    Dried out his eyes  . Cephalexin Itching  . Hydrocodone Itching  . Lexapro [Escitalopram Oxalate] Other (See Comments)    disorientation  . Lipitor [Atorvastatin] Other (See Comments)    Damage to calf muscles  . Oxycodone Itching  . Statins Other (See Comments)    Muscle pain   . Vytorin [Ezetimibe-Simvastatin] Other (See Comments)    Damage to calf muscles   Medications Prior to Admission  Medication Sig Dispense Refill  . aspirin EC 81 MG tablet Take 1 tablet (81 mg total) by mouth daily. 90 tablet 3  . carvedilol (COREG) 6.25 MG tablet TAKE 1 AND 1/2 TABLETS BY MOUTH TWICE DAILY (Patient taking differently: Take 6.25 mg by mouth in the morning and at bedtime. ) 90 tablet 6  . cholecalciferol (VITAMIN D) 1000 units tablet Take 1,000 Units by mouth daily.    Marland Kitchen esomeprazole (NEXIUM) 40 MG capsule Take 1 capsule (40 mg total) by mouth as needed (for heartburn). 30 capsule 5  . olopatadine (PATANOL) 0.1 % ophthalmic solution Place 1 drop into both eyes as needed for allergies. 5 mL 2  . rosuvastatin (CRESTOR) 5 MG tablet TAKE 1 TABLET BY MOUTH DAILY (Patient taking differently: Take 5 mg by mouth daily. ) 90 tablet 3  . sacubitril-valsartan (ENTRESTO) 24-26 MG Take 1 tablet by mouth 2  (two) times daily. 180 tablet 3  . furosemide (LASIX) 40 MG tablet Take 0.5 tablets (20 mg total) by mouth 3 (three) times a week. (Patient not taking: Reported on 10/12/2019) 30 tablet 6    Home: [place] expects to be discharged to:: Private residence Living Arrangements: Alone Available Help at Discharge: Family, Available PRN/intermittently(reports ex wife, [name] or [name] (g/f) can help him at home) Type of Home: House Home Access: Stairs to enter [employer] of Steps: 3 Entrance Stairs-Rails: Right Home Layout: One level Bathroom Shower/Tub: Multimedia programmer: [place]: None, Shower seat - built in  Functional History: Prior Function Level of Independence: Independent Comments: Lives with 2 dogs [place] and [place]. Does his own ADLs/IADLs, Working for a [employer]. Drives. Functional Status:  Mobility: Bed Mobility Overal bed mobility: Needs Assistance Bed Mobility: Supine to Sit Supine to sit: Min guard, HOB elevated General bed mobility  comments: + rail use, increased time; dizziness- resolves. VSS Transfers Overall transfer level: Needs assistance Equipment used: 2 person hand held assist Transfers: Sit to/from Stand, Stand Pivot Transfers Sit to Stand: Mod assist, +2 physical assistance Stand pivot transfers: Min assist, +2 physical assistance General transfer comment: x1 sit to stand with immediate need to sit down, then modA +2 for power up; minA +2 for stand pivot. Ambulation/Gait Ambulation/Gait assistance: Min assist, +2 safety/equipment Gait Distance (Feet): 12 Feet Assistive device: 1 person hand held assist Gait Pattern/deviations: Shuffle, Decreased step length - left, Decreased step length - right, Wide base of support General Gait Details: Short, shuffling steps with increased knee flexion bilaterally. Gait velocity: decreased Gait velocity interpretation: <1.8 ft/sec, indicate of risk for  recurrent falls    ADL: ADL Overall ADL's : Needs assistance/impaired Eating/Feeding: Modified independent, Sitting Grooming: Set up, Sitting Upper Body Bathing: Minimal assistance, Sitting Lower Body Bathing: Maximal assistance, Sitting/lateral leans, Sit to/from stand Upper Body Dressing : Minimal assistance, Sitting Lower Body Dressing: Maximal assistance, Sitting/lateral leans, Sit to/from stand Toilet Transfer: Minimal assistance, Stand-pivot, BSC, RW, +2 for physical assistance Toileting- Clothing Manipulation and Hygiene: Moderate assistance, +2 for physical assistance, Sitting/lateral lean, Sit to/from stand, Cueing for sequencing, Cueing for safety Functional mobility during ADLs: Moderate assistance, +2 for physical assistance, Rolling walker, Cueing for safety General ADL Comments: Pt limited by [facility] status, decreased strength, decreased activity tolerance and decreased ability to care for self.  Cognition: Cognition Overall Cognitive Status: Impaired/Different from baseline Orientation Level: Oriented to person, Oriented to place Cognition Arousal/Alertness: Awake/alert Behavior During Therapy: WFL for tasks assessed/performed Overall Cognitive Status: Impaired/Different from baseline Area of Impairment: Problem solving, Awareness, Safety/judgement Safety/Judgement: Decreased awareness of safety, Decreased awareness of deficits Awareness: Intellectual Problem Solving: Slow processing General Comments: HOH, slow processing. Poor awareness of deficits/safety.  Blood pressure 125/81, pulse 85, temperature 97.7 F (36.5 C), temperature source Axillary, resp. rate 17, height _0  (1.676 m), weight 74.8 kg, SpO2 93 %.  Physical Exam  General: Alert and oriented x 3, No apparent distress HEENT: Head is normocephalic, atraumatic, PERRLA, EOMI, sclera anicteric, oral mucosa pink and moist, dentition intact, ext ear canals clear,  Neck: Supple without JVD or  lymphadenopathy Heart: Reg rate and rhythm. No murmurs rubs or gallops Chest: CTA bilaterally without wheezes, rales, or rhonchi; no distress Abdomen: Soft, non-tender, non-distended, bowel sounds positive. Extremities: No clubbing, cyanosis, or edema. Pulses are 2+ Skin: Clean and intact without signs of breakdown Neuro: Patient is alert in no acute distress.  Follows commands.  Spells WORLD backward slowly but accurately. Provides his name and age.  He does exhibit some decreased awareness of deficits. 4+/5 strength throughout Musculoskeletal: Full ROM, No pain with AROM or PROM in the neck, trunk, or extremities. Posture appropriate Psych: Pt's affect is appropriate. Pt is cooperative  No results found for this or any previous visit (from the past 24 hour(s)). CT ANGIO HEAD W OR WO CONTRAST  Result Date: 02/26/2020 CLINICAL DATA:  Follow-up examination for acute stroke. EXAM: CT ANGIOGRAPHY HEAD AND NECK TECHNIQUE: Multidetector CT imaging of the head and neck was performed using the standard protocol during bolus administration of intravenous contrast. Multiplanar CT image reconstructions and MIPs were obtained to evaluate the vascular anatomy. Carotid stenosis measurements (when applicable) are obtained utilizing NASCET criteria, using the distal internal carotid diameter as the denominator. CONTRAST:  160m OMNIPAQUE IOHEXOL 350 MG/ML SOLN COMPARISON:  Prior MRI from 02/25/2020. FINDINGS: CT HEAD FINDINGS Brain: Age-related  cerebral atrophy with chronic small vessel ischemic disease. Evolving cytotoxic edema within the right occipital lobe compatible with previously identified acute right PCA territory infarct. No associated mass effect. No other acute large vessel territory infarct. Mild hyperdensity seen involving the right parieto-occipital region, just superior to the area of infarction is seen, suspicious for small volume subarachnoid hemorrhage (series 5, image 19). No other acute  intracranial hemorrhage. No mass lesion or midline shift. No hydrocephalus or extra-axial fluid collection. Vascular: No asymmetric hyperdense vessel. Calcified atherosclerosis seen about the skull base. Skull: Scalp soft tissues and calvarium within normal limits. Sinuses: Paranasal sinuses and mastoid air cells are clear. Orbits: Globes and orbital soft tissues demonstrate no acute finding. Review of the MIP images confirms the above findings CTA NECK FINDINGS Aortic arch: Visualized aortic arch of normal caliber with normal branch pattern. Mild-to-moderate atheromatous change about the aortic arch and origin of the great vessels without hemodynamically significant stenosis. Atheromatous plaque at the origin of the right subclavian artery with associated stenosis of up to 50% (series 12, image 195). Visualized subclavian arteries otherwise widely patent. Right carotid system: Scattered plaque within the mid-distal right CCA without significant stenosis. Bulky calcified plaque about the right bifurcation/proximal right ICA with associated stenosis of up to 40% by NASCET criteria. Right ICA tortuous but otherwise patent distally to the skull base. Left carotid system: Left common carotid artery patent from its origin to the bifurcation without stenosis. Bulky calcified plaque about the left bifurcation with associated stenosis of up to 50% by NASCET criteria. Left ICA tortuous but otherwise patent to the skull base. Vertebral arteries: Both vertebral arteries arise from the subclavian arteries. Left vertebral artery dominant with a diffusely hypoplastic right vertebral artery. Left vertebral artery tortuous proximally. Vertebral arteries patent within the neck without stenosis, dissection, or occlusion. Skeleton: No acute osseous abnormality. No discrete or worrisome osseous lesions. Multilevel cervical spondylosis without high-grade stenosis. Other neck: No other acute soft tissue abnormality within the neck. 12 mm  left thyroid nodule noted. No other mass lesion or adenopathy. Upper chest: Visualized upper chest demonstrates no acute finding. Centrilobular emphysema. Review of the MIP images confirms the above findings CTA HEAD FINDINGS Anterior circulation: Petrous segments patent bilaterally. Scattered atheromatous plaque within the cavernous/supraclinoid ICAs, right slightly worse than left. Relatively mild multifocal narrowing on the left. Short-segment moderate approximate 50% stenosis at the supraclinoid right ICA (series 11, image 119). A1 segments patent bilaterally. Normal anterior communicating artery complex. Anterior cerebral arteries patent to their distal aspects without stenosis. Diffuse atheromatous irregularity throughout the M1 segments with associated mild multifocal narrowing. Normal MCA bifurcations. Distal MCA branches well perfused although demonstrate diffuse small vessel atheromatous irregularity. Posterior circulation: Scattered atheromatous plaque throughout the dominant left V4 segment without high-grade stenosis. Left PICA patent. Hypoplastic right vertebral artery largely terminates in PICA, and then subsequently occludes. Some retrograde filling into the distal right V4 segment across the vertebrobasilar junction. Right PICA patent. Basilar demonstrates mild atheromatous irregularity but is widely patent to its distal aspect. Superior cerebral arteries patent bilaterally. Both PCAs primarily supplied via the basilar. Diffuse moderate to severe stenosis of the proximal left P2 segment. On the right, there is severe stenosis of the proximal-mid right P2 segment (series 14, image 24). Right PCA is markedly attenuated but grossly patent distally. Venous sinuses: Not well assessed due to timing the contrast bolus. Anatomic variants: Hypoplastic right vertebral artery largely terminating in PICA. Review of the MIP images confirms the above findings IMPRESSION: CT  HEAD IMPRESSION: 1. Patchy evolving  acute right PCA territory infarct, stable from previous MRA. 2. Patchy hyperdensity involving the right parietal lobe, suspicious for possible small volume subarachnoid hemorrhage. While laminar necrosis could conceivably have this appearance as well, there appears to be some FLAIR signal abnormality within the cortical sulci on previous brain MRI, favoring hemorrhage. 3. Underlying atrophy with chronic small vessel ischemic disease. CTA HEAD AND NECK IMPRESSION: 1. Negative CTA for emergent large vessel occlusion. 2. Severe stenosis of the proximal-mid right P2 segment, with markedly attenuated flow within the right PCA distally. 3. Moderate to severe proximal left P2 stenosis. 4. Hypoplastic right vertebral artery largely terminates in PICA. Moderate atheromatous irregularity throughout the dominant left V4 segment without high-grade stenosis. 5. Atheromatous plaque about the carotid bifurcations with associated stenosis of up to 40% on the right and 50% on the left. 6. 50% stenosis at the origin of the right subclavian artery. 7.  Emphysema (ICD10-J43.9). 8. 12 mm left thyroid nodule, of doubtful significance given size and patient age. No follow-up imaging recommended. (Ref: J Am Coll Radiol. 2015 Feb;12(2): 143-50). Critical Value/emergent results were called by telephone at the time of interpretation on 02/26/2020 at 2:33 am to provider Dr. Rory Percy, who verbally acknowledged these results. Electronically Signed   By: Jeannine Boga M.D.   On: 02/26/2020 02:39   CT HEAD WO CONTRAST  Result Date: 02/27/2020 CLINICAL DATA:  Diabetes, hypertension, stroke EXAM: CT HEAD WITHOUT CONTRAST TECHNIQUE: Contiguous axial images were obtained from the base of the skull through the vertex without intravenous contrast. COMPARISON:  02/26/2020, 02/25/2020 FINDINGS: Brain: Continued evolution of the right occipital infarct seen previously. The subtle hyperdensity within the sulci in the right parietooccipital region just  superior to the infarct bed again identified, consistent with petechial hemorrhage and unchanged since prior exam. There are stable hypodensities throughout the periventricular white matter consistent with chronic small vessel ischemic changes. Lateral ventricles and midline structures are stable. No acute extra-axial fluid collections. No mass effect. Vascular: No hyperdense vessel or unexpected calcification. Skull: Normal. Negative for fracture or focal lesion. Sinuses/Orbits: No acute finding. Other: None. IMPRESSION: 1. Stable right PCA territory infarct involving the right occipital lobe. 2. Stable petechial hemorrhage within the right parietooccipital sulci just superior to the infarct bed. Electronically Signed   By: Randa Ngo M.D.   On: 02/27/2020 02:53   CT ANGIO NECK W OR WO CONTRAST  Result Date: 02/26/2020 CLINICAL DATA:  Follow-up examination for acute stroke. EXAM: CT ANGIOGRAPHY HEAD AND NECK TECHNIQUE: Multidetector CT imaging of the head and neck was performed using the standard protocol during bolus administration of intravenous contrast. Multiplanar CT image reconstructions and MIPs were obtained to evaluate the vascular anatomy. Carotid stenosis measurements (when applicable) are obtained utilizing NASCET criteria, using the distal internal carotid diameter as the denominator. CONTRAST:  171m OMNIPAQUE IOHEXOL 350 MG/ML SOLN COMPARISON:  Prior MRI from 02/25/2020. FINDINGS: CT HEAD FINDINGS Brain: Age-related cerebral atrophy with chronic small vessel ischemic disease. Evolving cytotoxic edema within the right occipital lobe compatible with previously identified acute right PCA territory infarct. No associated mass effect. No other acute large vessel territory infarct. Mild hyperdensity seen involving the right parieto-occipital region, just superior to the area of infarction is seen, suspicious for small volume subarachnoid hemorrhage (series 5, image 19). No other acute intracranial  hemorrhage. No mass lesion or midline shift. No hydrocephalus or extra-axial fluid collection. Vascular: No asymmetric hyperdense vessel. Calcified atherosclerosis seen about the skull base. Skull:  Scalp soft tissues and calvarium within normal limits. Sinuses: Paranasal sinuses and mastoid air cells are clear. Orbits: Globes and orbital soft tissues demonstrate no acute finding. Review of the MIP images confirms the above findings CTA NECK FINDINGS Aortic arch: Visualized aortic arch of normal caliber with normal branch pattern. Mild-to-moderate atheromatous change about the aortic arch and origin of the great vessels without hemodynamically significant stenosis. Atheromatous plaque at the origin of the right subclavian artery with associated stenosis of up to 50% (series 12, image 195). Visualized subclavian arteries otherwise widely patent. Right carotid system: Scattered plaque within the mid-distal right CCA without significant stenosis. Bulky calcified plaque about the right bifurcation/proximal right ICA with associated stenosis of up to 40% by NASCET criteria. Right ICA tortuous but otherwise patent distally to the skull base. Left carotid system: Left common carotid artery patent from its origin to the bifurcation without stenosis. Bulky calcified plaque about the left bifurcation with associated stenosis of up to 50% by NASCET criteria. Left ICA tortuous but otherwise patent to the skull base. Vertebral arteries: Both vertebral arteries arise from the subclavian arteries. Left vertebral artery dominant with a diffusely hypoplastic right vertebral artery. Left vertebral artery tortuous proximally. Vertebral arteries patent within the neck without stenosis, dissection, or occlusion. Skeleton: No acute osseous abnormality. No discrete or worrisome osseous lesions. Multilevel cervical spondylosis without high-grade stenosis. Other neck: No other acute soft tissue abnormality within the neck. 12 mm left thyroid  nodule noted. No other mass lesion or adenopathy. Upper chest: Visualized upper chest demonstrates no acute finding. Centrilobular emphysema. Review of the MIP images confirms the above findings CTA HEAD FINDINGS Anterior circulation: Petrous segments patent bilaterally. Scattered atheromatous plaque within the cavernous/supraclinoid ICAs, right slightly worse than left. Relatively mild multifocal narrowing on the left. Short-segment moderate approximate 50% stenosis at the supraclinoid right ICA (series 11, image 119). A1 segments patent bilaterally. Normal anterior communicating artery complex. Anterior cerebral arteries patent to their distal aspects without stenosis. Diffuse atheromatous irregularity throughout the M1 segments with associated mild multifocal narrowing. Normal MCA bifurcations. Distal MCA branches well perfused although demonstrate diffuse small vessel atheromatous irregularity. Posterior circulation: Scattered atheromatous plaque throughout the dominant left V4 segment without high-grade stenosis. Left PICA patent. Hypoplastic right vertebral artery largely terminates in PICA, and then subsequently occludes. Some retrograde filling into the distal right V4 segment across the vertebrobasilar junction. Right PICA patent. Basilar demonstrates mild atheromatous irregularity but is widely patent to its distal aspect. Superior cerebral arteries patent bilaterally. Both PCAs primarily supplied via the basilar. Diffuse moderate to severe stenosis of the proximal left P2 segment. On the right, there is severe stenosis of the proximal-mid right P2 segment (series 14, image 24). Right PCA is markedly attenuated but grossly patent distally. Venous sinuses: Not well assessed due to timing the contrast bolus. Anatomic variants: Hypoplastic right vertebral artery largely terminating in PICA. Review of the MIP images confirms the above findings IMPRESSION: CT HEAD IMPRESSION: 1. Patchy evolving acute right PCA  territory infarct, stable from previous MRA. 2. Patchy hyperdensity involving the right parietal lobe, suspicious for possible small volume subarachnoid hemorrhage. While laminar necrosis could conceivably have this appearance as well, there appears to be some FLAIR signal abnormality within the cortical sulci on previous brain MRI, favoring hemorrhage. 3. Underlying atrophy with chronic small vessel ischemic disease. CTA HEAD AND NECK IMPRESSION: 1. Negative CTA for emergent large vessel occlusion. 2. Severe stenosis of the proximal-mid right P2 segment, with markedly attenuated flow within  the right PCA distally. 3. Moderate to severe proximal left P2 stenosis. 4. Hypoplastic right vertebral artery largely terminates in PICA. Moderate atheromatous irregularity throughout the dominant left V4 segment without high-grade stenosis. 5. Atheromatous plaque about the carotid bifurcations with associated stenosis of up to 40% on the right and 50% on the left. 6. 50% stenosis at the origin of the right subclavian artery. 7.  Emphysema (ICD10-J43.9). 8. 12 mm left thyroid nodule, of doubtful significance given size and patient age. No follow-up imaging recommended. (Ref: J Am Coll Radiol. 2015 Feb;12(2): 143-50). Critical Value/emergent results were called by telephone at the time of interpretation on 02/26/2020 at 2:33 am to provider Dr. Rory Percy, who verbally acknowledged these results. Electronically Signed   By: Jeannine Boga M.D.   On: 02/26/2020 02:39   MR Brain W and Wo Contrast  Result Date: 02/25/2020 CLINICAL DATA:  Dizziness and confusion EXAM: MRI HEAD WITHOUT AND WITH CONTRAST TECHNIQUE: Multiplanar, multiecho pulse sequences of the brain and surrounding structures were obtained without and with intravenous contrast. CONTRAST:  7.81m GADAVIST GADOBUTROL 1 MMOL/ML IV SOLN COMPARISON:  12/16/2014 FINDINGS: Brain: Patchy restricted diffusion in the right occipital and medial temporal cortex, including along  the hippocampus. Minimal stippled restricted diffusion in the right thalamus. No acute hemorrhage, hydrocephalus, or masslike finding. Chronic blood products along the bilateral occipital cortex. Two remote white matter insults the left cerebral hemisphere and a remote left thalamic microhemorrhage. Mild remote cortical infarct along the left occipital convexity. Moderate chronic small vessel ischemia in the white matter. Vascular: Absent flow in the non dominant right vertebral artery by T2 weighted imaging. Skull and upper cervical spine: Negative for marrow lesion. Sinuses/Orbits: Negative IMPRESSION: 1. Patchy acute infarct in the right PCA territory. 2. Remote left occipital cortex infarct and remote bilateral superficial occipital hemorrhage. 3. Abnormal or absent flow in the non dominant right vertebral artery Electronically Signed   By: JMonte FantasiaM.D.   On: 02/25/2020 22:30   ECHOCARDIOGRAM COMPLETE  Result Date: 02/26/2020    ECHOCARDIOGRAM REPORT   Patient Name:   APERCIVAL GLASHEENDate of Exam: 02/26/2020 Medical Rec #:  [id number]    Height:       66.0 in Accession #:    [id number]   Weight:       165.0 lb Date of Birth:  [birth year]    BSA:          1.843 m Patient Age:    89years      BP:           102/68 mmHg Patient Gender: M             HR:           91 bpm. Exam Location:  Inpatient Procedure: 2D Echo Indications:    CVA  History:        Patient has prior history of Echocardiogram examinations, most                 recent 08/04/2018. CAD; Risk Factors:Hypertension, Diabetes and                 Dyslipidemia.  Sonographer:    LJohny ChessReferring Phys: [phone] MILLER  Sonographer Comments: Technically difficult study due to poor echo windows. IMPRESSIONS  1. Left ventricular ejection fraction, by estimation, is 60 to 65%. The left ventricle has normal function. The left ventricle has no regional wall motion abnormalities. Left ventricular diastolic function could not be  evaluated.  2.  Right ventricular systolic function is normal. The right ventricular size is normal.  3. The mitral valve is grossly normal. No evidence of mitral valve regurgitation. No evidence of mitral stenosis.  4. The aortic valve is tricuspid. Aortic valve regurgitation is not visualized. Mild aortic valve sclerosis is present, with no evidence of aortic valve stenosis.  5. Aortic dilatation noted. There is moderate dilatation of the ascending aorta measuring 42 mm. FINDINGS  Left Ventricle: Left ventricular ejection fraction, by estimation, is 60 to 65%. The left ventricle has normal function. The left ventricle has no regional wall motion abnormalities. The left ventricular internal cavity size was normal in size. There is  no left ventricular hypertrophy. Left ventricular diastolic function could not be evaluated. Right Ventricle: The right ventricular size is normal. No increase in right ventricular wall thickness. Right ventricular systolic function is normal. Left Atrium: Left atrial size was normal in size. Right Atrium: Right atrial size was not well visualized. Pericardium: Trivial pericardial effusion is present. Mitral Valve: The mitral valve is grossly normal. No evidence of mitral valve regurgitation. No evidence of mitral valve stenosis. Tricuspid Valve: The tricuspid valve is normal in structure. Tricuspid valve regurgitation is trivial. No evidence of tricuspid stenosis. Aortic Valve: The aortic valve is tricuspid. . There is mild thickening and mild calcification of the aortic valve. Aortic valve regurgitation is not visualized. Mild aortic valve sclerosis is present, with no evidence of aortic valve stenosis. There is mild thickening of the aortic valve. There is mild calcification of the aortic valve. Pulmonic Valve: The pulmonic valve was grossly normal. Pulmonic valve regurgitation is trivial. No evidence of pulmonic stenosis. Aorta: Aortic dilatation noted. There is moderate dilatation of the ascending  aorta measuring 42 mm. IAS/Shunts: The atrial septum is grossly normal.  LEFT VENTRICLE PLAX 2D LVIDd:         4.10 cm LVIDs:         2.70 cm LV PW:         1.20 cm LV IVS:        1.10 cm LVOT diam:     2.10 cm LV SV:         39 LV SV Index:   21 LVOT Area:     3.46 cm  LEFT ATRIUM           Index LA diam:      4.20 cm 2.28 cm/m LA Vol (A4C): 43.9 ml 23.82 ml/m  AORTIC VALVE LVOT Vmax:   61.80 cm/s LVOT Vmean:  39.800 cm/s LVOT VTI:    0.112 m  AORTA Ao Root diam: 3.60 cm Ao Asc diam:  4.20 cm MV E velocity: 79.50 cm/s MV A velocity: 125.00 cm/s  SHUNTS MV E/A ratio:  0.64         Systemic VTI:  0.11 m                             Systemic Diam: 2.10 cm Mertie Moores MD Electronically signed by Mertie Moores MD Signature Date/Time: 02/26/2020/12:54:14 PM    Final     Assessment/Plan: Diagnosis: Right PCA acute infarct 1. Does the need for close, 24 hr/day medical supervision in concert with the patient's rehab needs make it unreasonable for this patient to be served in a less intensive setting? Yes 2. Co-Morbidities requiring supervision/potential complications: chronic systolic heart failure, coronary artery disease, hypertension, hyperlipidemia, bilateral lower extremity neuropathy, diabetes, overweight  3. Due to bladder management, bowel management, safety, skin/wound care, disease management, medication administration, pain management and patient education, does the patient require 24 hr/day rehab nursing? Yes 4. Does the patient require coordinated care of a physician, rehab nurse, therapy disciplines of PT, OT to address physical and functional deficits in the context of the above medical diagnosis(es)? Yes Addressing deficits in the following areas: balance, endurance, locomotion, strength, transferring, bowel/bladder control, bathing, dressing, feeding, grooming, toileting and psychosocial support 5. Can the patient actively participate in an intensive therapy program of at least 3 hrs of therapy  per day at least 5 days per week? Yes 6. The potential for patient to make measurable gains while on inpatient rehab is excellent 7. Anticipated functional outcomes upon discharge from inpatient rehab are modified independent  with PT, modified independent with OT, modified independent with SLP. 8. Estimated rehab length of stay to reach the above functional goals is: 12-14 days 9. Anticipated discharge destination: Home 10. Overall Rehab/Functional Prognosis: excellent  RECOMMENDATIONS: This patient's condition is appropriate for continued rehabilitative care in the following setting: CIR Patient has agreed to participate in recommended program. Yes Note that insurance prior authorization may be required for reimbursement for recommended care.  Comment: Mr. Twilley would be an excellent CIR candidate. He lives alone but will have significant assistance from his ex-wife upon discharge. Thank you for this consult. We will continue to follow in Mr. Karwowski care.   Lavon Paganini Angiulli, PA-C 02/27/2020   I have personally performed a face to face diagnostic evaluation, including, but not limited to relevant history and physical exam findings, of this patient and developed relevant assessment and plan.  Additionally, I have reviewed and concur with the physician assistant's documentation above.  Leeroy Cha, D

## 2020-02-28 LAB — GLUCOSE, CAPILLARY
Glucose-Capillary: 82 mg/dL (ref 70–99)
Glucose-Capillary: 85 mg/dL (ref 70–99)
Glucose-Capillary: 96 mg/dL (ref 70–99)
Glucose-Capillary: 86 mg/dL (ref 70–99)
Glucose-Capillary: 110 mg/dL — ABNORMAL HIGH (ref 70–99)

## 2020-02-28 NOTE — Progress Notes (Signed)
Occupational Therapy Treatment Patient Details Name: Christian [name] MRN: [id number] DOB: [birth year] Today's Date: 02/28/2020    History of present illness Patient is a 79 y/o male who presents with "feeling foggy," dizziness and numbness in BLEs. Brain MRI- right PCA infarct. HEad CT- possible bleed. PMH includes neuropathy involving BLEs, DM, HTN, HLD, depression, anxiety,CAD and heart failure.   OT comments  Pt with decreased awareness to L visual field deficits and appears shocked each time he makes a L visual field discovery. Pt requires total+2 mod (A) for hand held transfers. Pt high fall risk due to visual deficits with all adls. Pt educated on L visual field scanning during session for compensatory strategy. Pt continues to remain very appropriate for CIR admission. Pt reports [name] and [name] will help him upon d/c.   Follow Up Recommendations  CIR;Supervision/Assistance - 24 hour    Equipment Recommendations  Other (comment)    Recommendations for Other Services      Precautions / Restrictions Precautions Precautions: Fall Precaution Comments: L hemianopsia       Mobility Bed Mobility Overal bed mobility: Needs Assistance Bed Mobility: Supine to Sit     Supine to sit: Max assist     General bed mobility comments: pt requires use of staff and rails. pt pulling with R UE  Transfers Overall transfer level: Needs assistance   Transfers: Sit to/from Stand Sit to Stand: +2 physical assistance;Mod assist         General transfer comment: pt with over exacerated steps that appears like a gallop with R LE leading and L LE lagging. Pt steps very wide with R LE compared to L LE    Balance Overall balance assessment: History of Falls         Standing balance support: Single extremity supported;During functional activity Standing balance-Leahy Scale: Poor Standing balance comment: reliant on OT for external (A)                            ADL either  performed or assessed with clinical judgement   ADL Overall ADL's : Needs assistance/impaired     Grooming: Minimal assistance;Sitting   Upper Body Bathing: Minimal assistance;Standing   Lower Body Bathing: Minimal assistance;Sit to/from stand       Lower Body Dressing: Maximal assistance Lower Body Dressing Details (indicate cue type and reason): requires (A) to thread underwear on Toilet Transfer: +2 for physical assistance;Moderate assistance           Functional mobility during ADLs: +2 for physical assistance;Moderate assistance General ADL Comments: Pt incontinence on arrival. pt asking for help and states somethign is wet. pt unaware of urine on underwear. pt completed sink level bath with (A) and cues for sequence. pt fixated on being indep with task and expressed prior prolonged hospitalizations. pt with L decreased attention      Vision   Vision Assessment?: Vision impaired- to be further tested in functional context Additional Comments: pt states 336 as patient knows it from memory but only reading items on R side of paper. pt when asked to turn head only seeing objects in central visual field. pt was unable to locate L bed rail even with head turn. pt requires max cues for vision L side.    Perception     Praxis      Cognition Arousal/Alertness: Awake/alert Behavior During Therapy: WFL for tasks assessed/performed;Impulsive Overall Cognitive Status: Impaired/Different from baseline Area of Impairment: Attention;Safety/judgement;Following commands;Awareness;Problem  solving                   Current Attention Level: Selective   Following Commands: Follows multi-step commands inconsistently Safety/Judgement: Decreased awareness of safety;Decreased awareness of deficits Awareness: Emergent Problem Solving: Slow processing;Difficulty sequencing General Comments: Pt reading phone number as only 4 digits. pt asked how many digits are in phone number. pt  states 7 digits. pt requires mod cues to scan L to read entire phoen number. pt motivated to read x2 caregiver phone numbers in hand. pt attempting initially to read upside down and OT had to cue to correctly position paper        Exercises     Shoulder Instructions       General Comments      Pertinent Vitals/ Pain       Pain Assessment: Faces Faces Pain Scale: Hurts little more Pain Location: L foot Pain Descriptors / Indicators: Constant Pain Intervention(s): Repositioned;Monitored during session  Home Living                                          Prior Functioning/Environment              Frequency  Min 2X/week        Progress Toward Goals  OT Goals(current goals can now be found in the care plan section)  Progress towards OT goals: Progressing toward goals  Acute Rehab OT Goals Patient Stated Goal: to have water and phone numbers on my table OT Goal Formulation: With patient Time For Goal Achievement: 03/11/20 Potential to Achieve Goals: Good ADL Goals Pt Will Perform Lower Body Dressing: with min guard assist;sitting/lateral leans;sit to/from stand Pt/caregiver will Perform Home Exercise Program: Increased strength;Both right and left upper extremity;With Supervision Additional ADL Goal #1: Pt will participate in visual perceptual L visual field activities x5 mins to increase awareness and decrease risk of falls. Additional ADL Goal #2: Pt will state/utilize (3) fall risk strategies in order to improve prevention of falls and increase safety awareness with no cues after education.  Plan Discharge plan remains appropriate    Co-evaluation    PT/OT/SLP Co-Evaluation/Treatment: Yes Reason for Co-Treatment: Complexity of the patient's impairments (multi-system involvement);To address functional/ADL transfers;For patient/therapist safety   OT goals addressed during session: ADL's and self-care;Strengthening/ROM;Proper use of Adaptive  equipment and DME      AM-PAC OT "6 Clicks" Daily Activity     Outcome Measure   Help from another person eating meals?: None Help from another person taking care of personal grooming?: A Little Help from another person toileting, which includes using toliet, bedpan, or urinal?: A Lot Help from another person bathing (including washing, rinsing, drying)?: A Lot Help from another person to put on and taking off regular upper body clothing?: A Little Help from another person to put on and taking off regular lower body clothing?: A Lot 6 Click Score: 16    End of Session Equipment Utilized During Treatment: Gait belt  OT Visit Diagnosis: Unsteadiness on feet (R26.81);Muscle weakness (generalized) (M62.81)   Activity Tolerance Patient tolerated treatment well   Patient Left in chair;with call bell/phone within reach;with chair alarm set   Nurse Communication Mobility status;Precautions        Time: [id number] OT Time Calculation (min): 30 min  Charges: OT General Charges $OT Visit: 1 Visit OT Treatments $Self Care/Home Management : 8-22  mins   Fleeta Emmer, OTR/L  Acute Rehabilitation Services Pager: [phone] Office: [phone] .    Jeri Modena 02/28/2020, 12:23 PM

## 2020-02-28 NOTE — Hospital Course (Signed)
                   Follow up:  Neuro recs: follow up in stroke clinic. Continue ASA and Plavix x 3 months (through May 29, 2020). Then ASA alone

## 2020-02-28 NOTE — Discharge Summary (Signed)
[facility] Discharge Summary  Patient name: Christian [name] Medical record number: [id number] Date of birth: 03/16/1941 Age: 79 y.o. Gender: male Date of Admission: 02/25/2020  Date of Discharge: 03/01/20 Admitting Physician: Leeanne Rio, MD  Primary Care Provider: Maximiano Coss, NP Consultants:  Neurology   Indication for Hospitalization: PCA stroke   Discharge Diagnoses/Problem List:  Allergic rhinitis HFrEF CA Essential HTN GERD Hernia of abdominal cavity HLD Neuropathy involving both lower extremities DMT2  Disposition: CIR  Discharge Condition: Medically stable for discharge   Discharge Exam:  BP 116/72 (BP Location: Left Arm)   Pulse 78   Temp 97.6 F (36.4 C) (Oral)   Resp 20   Ht 5\' 6"  (1.676 m)   Wt 74.8 kg   SpO2 98%   BMI 26.63 kg/m  General: Alert, sitting up in bed eating breakfast, no acute distress Cardio: Normal S1 and S2, RRR Abdomen: Bowel sounds normal. Abdomen soft and non-tender.  Extremities: No peripheral edema. Warm/ well perfused. Neuro: Cranial nerves grossly intact  Brief Hospital Course:   Acute right PCA CVA Patient presented with 1 week of worsening dizziness, headache and worsening bilateral lower extremity numbness. Full for details please see H&P. Was admitted for further work up for stroke. On exam: Left homonymous hemianopsia. MRI Brain - Acute R PCA acute infarct, L occipital cortex infarct, remote b/l superficial occipital hemorrhage, absent flow in non dominant R vertebral artery. CT head/neck: severe stenosis of proximal mid right P2 segment. Mod-severe proximal left P2 stenosis.  Hypoplastic right vertebral artery. Moderate atheromatous irregularity in dominant left V4. Atheromatous plaque at carotid bifurcations 40/50%. 50% stneosed right subclavian artery. Echo: EF 60-65%, moderate aortic dilatation. Pt did not receive TPA. Follow up CT scan on 02/27/20 showed stable right PCA territory infarct  with a small petechial hemorrhage within the right parieto-occipital sulci just superior to the infarct bed. Pt initially received ASA 325mg  and per neuro recommendations were to continue ASA 81mg  and Plavix 81mg  once daily for 3 months for CVA prophylaxis. (see below). Pt was evaluated by CIR who felt he was a great candidate for inpatient rehab. Pt dsicharged to CIR on 03/01/20    Hyperlipidemia Patient declined increasing Rosuvastatin to 10-20mg  due to previous side effects (myalgias) of statins. Should continue Rosuvastatin 5mg  as an outpatient.  Issues for Follow Up:  1. Follow up in stroke clinic in 6 weeks.  2. Continue ASA and Plavix x 3 months (through May 29, 2020). Then continue ASA alone 3. Stopped Lasix due to no concern for fluid overload, adequate EF and bothersome side effects for patient. Ensure follow up with cardiologist   Significant Procedures:  N/A  Significant Labs and Imaging:  Recent Labs  Lab 02/24/20 1013 02/26/20 0132  WBC 6.3 7.9  HGB 15.4 14.8  HCT 46.1 46.3  PLT 202 204   Recent Labs  Lab 02/24/20 1013 02/26/20 0132  NA 141 135  K 4.4 4.5  CL 105 103  CO2 22 22  GLUCOSE 116* 149*  BUN 17 16  CREATININE 0.94 0.95  CALCIUM 9.1 8.9  ALKPHOS 50 42  AST 16 18  ALT 13 17  ALBUMIN 4.4 3.9    Results/Tests Pending at Time of Discharge: N/A  Discharge Medications:  Allergies as of 03/01/2020      Reactions   Amoxicillin Hives   Bactrim [sulfamethoxazole-trimethoprim] Other (See Comments)   Dried out his eyes   Cephalexin Itching   Hydrocodone Itching   Lexapro [escitalopram  Oxalate] Other (See Comments)   disorientation   Lipitor [atorvastatin] Other (See Comments)   Damage to calf muscles   Oxycodone Itching   Statins Other (See Comments)   Muscle pain   Vytorin [ezetimibe-simvastatin] Other (See Comments)   Damage to calf muscles      Medication List    STOP taking these medications   furosemide 40 MG tablet Commonly known  as: LASIX     TAKE these medications   aspirin EC 81 MG tablet Take 1 tablet (81 mg total) by mouth daily.   carvedilol 6.25 MG tablet Commonly known as: COREG TAKE 1 AND 1/2 TABLETS BY MOUTH TWICE DAILY What changed:   how much to take  when to take this   cholecalciferol 1000 units tablet Commonly known as: VITAMIN D Take 1,000 Units by mouth daily.   clopidogrel 75 MG tablet Commonly known as: PLAVIX Take 1 tablet (75 mg total) by mouth daily.   esomeprazole 40 MG capsule Commonly known as: NEXIUM Take 1 capsule (40 mg total) by mouth as needed (for heartburn).   olopatadine 0.1 % ophthalmic solution Commonly known as: PATANOL Place 1 drop into both eyes as needed for allergies.   rosuvastatin 5 MG tablet Commonly known as: CRESTOR TAKE 1 TABLET BY MOUTH DAILY   sacubitril-valsartan 24-26 MG Commonly known as: Entresto Take 1 tablet by mouth 2 (two) times daily.       Discharge Instructions: Please refer to Patient Instructions section of EMR for full details.  Patient was counseled important signs and symptoms that should prompt return to medical care, changes in medications, dietary instructions, activity restrictions, and follow up appointments.   Follow-Up Appointments: Follow-up Information    Schedule an appointment as soon as possible for a visit  with Maximiano Coss, NP.   Specialty: Adult Health Nurse Practitioner Contact information: [place] [address] [phone]        [place].   Specialty: Emergency Medicine Why: If symptoms worsen Contact information: [address] [id number] [place]          Lattie Haw, MD 03/01/2020, 9:17 AM PGY-1, [place]

## 2020-02-28 NOTE — PMR Pre-admission (Signed)
PMR Admission Coordinator Pre-Admission Assessment  Patient: Christian [name] is an 79 y.o., male MRN: [id number] DOB: 01-26-1941 Height: _0  (167.6 cm) Weight: 74.8 kg              Insurance Information HMO: yes PRIMARY: [facility] Medicare      Policy#: [id number]      Subscriber: pt CM Name: Wilburn Cornelia      Phone#: [phone] option #3     Fax#: [phone] Pre-Cert#: [id number] approved for 7 days      Employer:  Benefits:  Phone #: [phone]     Name: 5/10 Eff. Date: 10/21/2019     Deduct: none      Out of Pocket Max: $3600      Life Max: none  CIR: $295 co pay per day days 1 until 5      SNF: no copay days 1 until 20; $184 co pay per day days 21 until 40; no copay days 41 until 100 Outpatient: $30 per visit     Co-Pay: visits per medical neccesity Home Health: 100%      Co-Pay: visits per medical neccesity DME: 80%     Co-Pay: 20% Providers: in network  SECONDARY: none        Financial Counselor:       Phone#:   The Engineer, petroleum" for patients in Inpatient Rehabilitation Facilities with attached "Privacy Act Fossil Records" was provided and verbally reviewed with: Patient  Emergency Contact Information Contact Information    Name Relation Home Work [place], [place]   Moshe Salisbury Friend [phone]     Alda Lea Other [phone]       Current Medical History  Patient Admitting Diagnosis: Right PCA acute infarct  History of Present Illness: 79 year old right-handed male with history of chronic systolic congestive heart failure maintained on Entresto, CAD/nonischemic cardiomyopathy maintained on aspirin, hypertension, diabetes mellitus.   Presented 02/26/2020 with dizziness/lightheaded and increasing numbness bilateral lower extremities.  MRI showed patchy acute infarct in the right PCA territory.  Remote left occipital cortex infarct and remote bilateral superficial occipital hemorrhage.  CT  angiogram head and neck negative for emergent large vessel occlusion.  Echocardiogram with ejection fraction of 65% no wall motion abnormalities or emboli identified.  Patient did not receive TPA.  Admission chemistries unremarkable.  A follow-up CT scan was completed 02/27/2020 showing stable right PCA territory infarct with a small petechial hemorrhage within the right parieto-occipital sulci just superior to the infarct bed.  Currently maintained on low-dose aspirin for CVA as well as Plavix prophylaxis x3 months then aspirin alone. Patient placed on Lovenox for DVT prophylaxis. Tolerating a regular diet.    Complete NIHSS TOTAL: 4 Glasgow Coma Scale Score: 15  Past Medical History  Past Medical History:  Diagnosis Date  . Allergic rhinitis due to pollen 03/29/2018  . Chronic systolic heart failure (HCC) - EF improved to normal on Rx    Echo 11/18: Mild LVH, EF 20, diffuse HK mild AI, a sending aorta 42 mm (mildly dilated), mild MR, mild TR, trivial PI, PASP 44 // Echo 3/19: mild LVH, EF 25-30, diff HK, Gr 1 DD, asc aorta 43 mm (mildly dilated), MAC, mild reduced RVSF // Limited echo 10/19: Mild concentric LVH, EF 81-27, grade 1 diastolic dysfunction, MAC, mild LAE   . Coronary artery disease involving native coronary artery of native heart without angina pectoris 11/11/2017   R/L HC 11/18: pLAD 45, oD1 65 ;  elevated PCWP (mean 29), moderate pulmonary hypertension, CO 3.66, CI 2.07  . Depression with anxiety 07/08/2017  . Essential hypertension 07/08/2017  . GERD with esophagitis 01/11/2018  . Hernia of abdominal cavity   . Hyperlipidemia   . Hyperlipidemia LDL goal <130 05/25/2017   The 10-year ASCVD risk score Mikey Bussing DC Jr., et al., 2013) is: 31.1%   Values used to calculate the score:     Age: 39 years     Sex: Male     Is Non-Hispanic African American: No     Diabetic: No     Tobacco smoker: No     Systolic Blood Pressure: 098 mmHg     Is BP treated: No     HDL Cholesterol: 68.7 mg/dL     Total  Cholesterol: 258 mg/dL   . Idiopathic inflammatory myopathy 05/25/2017  . Neuropathy involving both lower extremities 05/25/2017  . Nonischemic cardiomyopathy ([place]) 11/11/2017   LHC 09/16/17-nonobstructive CAD  . Oropharyngeal dysphagia 01/11/2018  . Type 2 diabetes mellitus with complication, without long-term current use of insulin ([place]) 07/08/2017    Family History  family history includes Early death in his father; Hypertension in his father; Kidney disease in his father; Stroke in his father and mother.  Prior Rehab/Hospitalizations:  Has the patient had prior rehab or hospitalizations prior to admission? Yes  Has the patient had major surgery during 100 days prior to admission? No  Current Medications   Current Facility-Administered Medications:  .  acetaminophen (TYLENOL) tablet 650 mg, 650 mg, Oral, Q4H PRN **OR** acetaminophen (TYLENOL) 160 MG/5ML solution 650 mg, 650 mg, Per Tube, Q4H PRN **OR** acetaminophen (TYLENOL) suppository 650 mg, 650 mg, Rectal, Q4H PRN, Bonnita Hollow, MD .  alum & mag hydroxide-simeth (MAALOX/MYLANTA) 200-200-20 MG/5ML suspension 30 mL, 30 mL, Oral, Q4H PRN, Bonnita Hollow, MD, 30 mL at 02/26/20 0104 .  aspirin chewable tablet 81 mg, 81 mg, Oral, Daily, Amie Portland, MD, 81 mg at 03/01/20 0906 .  carvedilol (COREG) tablet 6.25 mg, 6.25 mg, Oral, BID WC, Lattie Haw, MD, 6.25 mg at 03/01/20 0907 .  cholecalciferol (VITAMIN D3) tablet 1,000 Units, 1,000 Units, Oral, Daily, Bonnita Hollow, MD, 1,000 Units at 03/01/20 [phone] .  clopidogrel (PLAVIX) tablet 75 mg, 75 mg, Oral, Daily, Leeanne Rio, MD, 75 mg at 03/01/20 0907 .  enoxaparin (LOVENOX) injection 40 mg, 40 mg, Subcutaneous, Q24H, Ko, Christine, RPH, 40 mg at 02/29/20 1635 .  esomeprazole (NEXIUM) capsule 40 mg, 40 mg, Oral, QAC breakfast, Wilber Oliphant, MD, 40 mg at 03/01/20 0907 .  furosemide (LASIX) tablet 20 mg, 20 mg, Oral, Once per day on Mon Wed Fri, Josephine Igo B, MD, 20 mg  at 02/29/20 1006 .  olopatadine (PATANOL) 0.1 % ophthalmic solution 1 drop, 1 drop, Both Eyes, BID PRN, Bonnita Hollow, MD .  rosuvastatin (CRESTOR) tablet 5 mg, 5 mg, Oral, Daily, Bonnita Hollow, MD, 5 mg at 03/01/20 0907 .  sacubitril-valsartan (ENTRESTO) 24-26 mg per tablet, 1 tablet, Oral, BID, Bonnita Hollow, MD, 1 tablet at 03/01/20 [phone] .  senna-docusate (Senokot-S) tablet 1 tablet, 1 tablet, Oral, QHS PRN, Bonnita Hollow, MD  Patients Current Diet:  Diet Order            Diet Carb Modified Fluid consistency: Thin; Room service appropriate? 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 Does his own ADLs/IADLs, Working for a [employer]. Drives.  Self Care: Did the patient need help bathing, dressing, using the toilet or eating?  Independent  Indoor Mobility: Did the patient need assistance with walking from room to room (with or without device)? Independent  Stairs: Did the patient need assistance with internal or external stairs (with or without device)? Independent  Functional Cognition: Did the patient need help planning regular tasks such as shopping or remembering to take medications? Independent  Home Assistive Devices / Equipment Home Equipment: None, Shower seat - built in  Prior Device Use: Indicate devices/aids used by the patient prior to current illness, exacerbation or injury? None of the above  Current Functional Level Cognition  Arousal/Alertness: Awake/alert Overall Cognitive Status: Impaired/Different from  baseline Current Attention Level: Selective Orientation Level: Oriented to person, Oriented to place, Disoriented to time, Oriented to situation Following Commands: Follows multi-step commands inconsistently Safety/Judgement: Decreased awareness of safety, Decreased awareness of deficits General Comments: Pt reading phone number as only 4 digits. pt asked how many digits are in phone number. pt states 7 digits. pt requires mod cues to scan L to read entire phoen number. pt motivated to read x2 caregiver phone numbers in hand. pt attempting initially to read upside down and OT had to cue to correctly position paper Attention: Focused, Sustained Focused Attention: Appears intact Sustained Attention: Appears intact Memory: Impaired Memory Impairment: Retrieval deficit, Storage deficit, Decreased recall of new information(Immediate: 5/5 with repetition; delayed: 2/5; with cues 2/3) Awareness: Impaired Awareness Impairment: Emergent impairment Problem Solving: Impaired Problem Solving Impairment: Verbal complex Executive Function: Reasoning, Sequencing Reasoning: Impaired Reasoning Impairment: Verbal complex    Extremity Assessment (includes Sensation/Coordination)  Upper Extremity Assessment: Generalized weakness, RUE deficits/detail, LUE deficits/detail RUE Deficits / Details: AROM, WFLs; strength is 3+/5 MM grade LUE Deficits / Details: AROM, WFLs; strength is 3+/5 MM grade  Lower Extremity Assessment: Defer to PT evaluation, Generalized weakness RLE Deficits / Details: Decreased sensation from knees down to foot-premorbid, otherwise strength 4/5 RLE Sensation: decreased light touch LLE Deficits / Details: Decreased sensation from knees down to foot- premorbid; otherwise strength 4/5. LLE Sensation: decreased light touch    ADLs  Overall ADL's : Needs assistance/impaired Eating/Feeding: Modified independent, Sitting Grooming: Minimal assistance, Sitting Upper Body Bathing: Minimal  assistance, Standing Lower Body Bathing: Minimal assistance, Sit to/from stand Upper Body Dressing : Minimal assistance, Sitting Lower Body Dressing: Maximal assistance Lower Body Dressing Details (indicate cue type and reason): requires (A) to thread underwear on Toilet Transfer: +2 for physical assistance, Moderate assistance Toileting- Clothing Manipulation and Hygiene: Moderate assistance, +2 for physical assistance, Sitting/lateral lean, Sit to/from stand, Cueing for sequencing, Cueing for safety Functional mobility during ADLs: +2 for physical assistance, Moderate assistance General ADL Comments: Pt incontinence on arrival. pt asking for help and states somethign is wet. pt unaware of urine on underwear. pt completed sink level bath with (A) and cues for sequence. pt fixated on being indep with task and expressed prior prolonged hospitalizations. pt with L decreased attention     Mobility  Overal bed mobility: Needs Assistance Bed Mobility: Supine to Sit Supine to sit: Min assist General bed  mobility comments: multimodal cues for sequencing going toward L side and increased time for pt to find L rail to assist in rolling; assist to elevate trunk into sitting    Transfers  Overall transfer level: Needs assistance Equipment used: 2 person hand held assist Transfers: Sit to/from Stand Sit to Stand: +2 physical assistance, Mod assist Stand pivot transfers: Min assist, +2 physical assistance General transfer comment: assist to power up into standing and for balance     Ambulation / Gait / Stairs / Wheelchair Mobility  Ambulation/Gait Ambulation/Gait assistance: Mod assist, +2 physical assistance, +2 safety/equipment Gait Distance (Feet): 75 Feet Assistive device: 2 person hand held assist(assist at trunk with gait belt) Gait Pattern/deviations: Step-to pattern, Decreased step length - left, Step-through pattern, Decreased step length - right General Gait Details: anterior lean at  times; cues for gait speed and bilat step lengths; improving step length symmetry noted today; shuffling gait when turning/directional changes Gait velocity: decreased Gait velocity interpretation: <1.8 ft/sec, indicate of risk for recurrent falls    Posture / Balance Balance Overall balance assessment: History of Falls Sitting-balance support: Feet supported Sitting balance-Leahy Scale: Fair Standing balance support: During functional activity, Bilateral upper extremity supported, Single extremity supported Standing balance-Leahy Scale: Poor Standing balance comment: pt continues to reach to R side with L UE to hold onto something if standing/ambulating with single UE only    Special needs/care consideration Diabetic management Hgb A1c 6.4 Visitors are [place] and Journalist, newspaper. Verneita Griffes is exwife and [name] is a friend    Previous Geologist, engineering: Alone  Lives With: Alone Available Help at Discharge: Family, Available PRN/intermittently Type of Home: House Home Layout: One level Home Access: Stairs to enter Entrance Stairs-Rails: Right Entrance Stairs-Number of Steps: 3 Bathroom Shower/Tub: Multimedia programmer: Standard Bathroom Accessibility: Yes How Accessible: Accessible via walker [place]: No  Discharge Living Setting Plans for Discharge Living Setting: Patient's home, Alone Type of Home at Discharge: House Discharge Home Layout: One level Discharge Home Access: Stairs to enter Entrance Stairs-Rails: Right Entrance Stairs-Number of Steps: 3 Discharge Bathroom Shower/Tub: Walk-in shower Discharge Bathroom Toilet: Standard Discharge [place] Accessibility: Yes How Accessible: Accessible via walker Does the patient have any problems obtaining your medications?: No  Social/Family/Support Systems Patient Roles: Mudlogger) Contact Information: Verneita Griffes, exwife Anticipated Caregiver: exwife and friends Anticipated  Caregiver's Contact Information: [phone] Ability/Limitations of Caregiver: intermittent assistant longterm Caregiver Availability: Intermittent Discharge Plan Discussed with Primary Caregiver: Yes Is Caregiver In Agreement with Plan?: Yes Does Caregiver/Family have Issues with Lodging/Transportation while Pt is in Rehab?: No  Goals Patient/Family Goal for Rehab: Mod I with PT, OT and SLP Expected length of stay: ELOS 12 to 14 days Pt/Family Agrees to Admission and willing to participate: Yes Program Orientation Provided & Reviewed with Pt/Caregiver Including Roles  & Responsibilities: Yes  Decrease burden of Care through IP rehab admission: n/a  Possible need for SNF placement upon discharge:not anticipated   Patient Condition: This patient's medical and functional status has changed since the consult dated: 02/27/2020 in which the Rehabilitation Physician determined and documented that the patient's condition is appropriate for intensive rehabilitative care in an inpatient rehabilitation facility. See "History of Present Illness" (above) for medical update. Functional changes are: mod assist overall. Patient's medical and functional status update has been discussed with the Rehabilitation physician and patient remains appropriate for inpatient rehabilitation. Will admit to inpatient rehab today.  Preadmission Screen Completed By:  Cleatrice Burke, RN, 03/01/2020 10:36  AM ______________________________________________________________________   Discussed status with Dr. Ranell Patrick on 03/01/2020 at  1035 and received approval for admission today.  Admission Coordinator:  Cleatrice Burke, time 0277 Date 03/01/2020

## 2020-02-28 NOTE — H&P (Signed)
Physical Medicine and Rehabilitation Admission H&P    Chief Complaint  Patient presents with  . Dizziness  : HPI: Christian [name] is a 79 year old right-handed male with history of chronic systolic congestive heart failure maintained on Entresto, CAD/nonischemic cardiomyopathy maintained on aspirin, hypertension, diabetes mellitus.  Per chart review lives alone.  1 level home 3 steps to entry.  Works for a [employer].  He has an ex-wife who can assist on discharge.  Presented 02/26/2020 with dizziness/lightheaded and increasing numbness bilateral lower extremities.  MRI showed patchy acute infarct in the right PCA territory.  Remote left occipital cortex infarct and remote bilateral superficial occipital hemorrhage.  CT angiogram head and neck negative for emergent large vessel occlusion.  Echocardiogram with ejection fraction of 65% no wall motion abnormalities or emboli identified.  Patient did not receive TPA.  Admission chemistries unremarkable.  A follow-up CT scan was completed 02/27/2020 showing stable right PCA territory infarct with a small petechial hemorrhage within the right parieto-occipital sulci just superior to the infarct bed.  Currently maintained on low-dose aspirin for CVA as well as Plavix prophylaxis x3 months then aspirin alone.  Patient was later placed on Lovenox for DVT prophylaxis 02/29/2020 tolerating a regular diet.  Therapy evaluations completed and patient was admitted for a comprehensive rehab program.  Review of Systems  Cardiovascular: Positive for leg swelling.  Gastrointestinal: Positive for constipation.       GERD  Musculoskeletal: Positive for myalgias.  Neurological: Positive for sensory change.  Psychiatric/Behavioral: Positive for depression. The patient has insomnia.        Anxiety   Past Medical History:  Diagnosis Date  . Allergic rhinitis due to pollen 03/29/2018  . Chronic systolic heart failure (HCC) - EF improved to normal on Rx    Echo  11/18: Mild LVH, EF 20, diffuse HK mild AI, a sending aorta 42 mm (mildly dilated), mild MR, mild TR, trivial PI, PASP 44 // Echo 3/19: mild LVH, EF 25-30, diff HK, Gr 1 DD, asc aorta 43 mm (mildly dilated), MAC, mild reduced RVSF // Limited echo 10/19: Mild concentric LVH, EF 29-92, grade 1 diastolic dysfunction, MAC, mild LAE   . Coronary artery disease involving native coronary artery of native heart without angina pectoris 11/11/2017   R/L HC 11/18: pLAD 45, oD1 65 ; elevated PCWP (mean 29), moderate pulmonary hypertension, CO 3.66, CI 2.07  . Depression with anxiety 07/08/2017  . Essential hypertension 07/08/2017  . GERD with esophagitis 01/11/2018  . Hernia of abdominal cavity   . Hyperlipidemia   . Hyperlipidemia LDL goal <130 05/25/2017   The 10-year ASCVD risk score Mikey Bussing DC Jr., et al., 2013) is: 31.1%   Values used to calculate the score:     Age: 81 years     Sex: Male     Is Non-Hispanic African American: No     Diabetic: No     Tobacco smoker: No     Systolic Blood Pressure: 426 mmHg     Is BP treated: No     HDL Cholesterol: 68.7 mg/dL     Total Cholesterol: 258 mg/dL   . Idiopathic inflammatory myopathy 05/25/2017  . Neuropathy involving both lower extremities 05/25/2017  . Nonischemic cardiomyopathy ([place]) 11/11/2017   LHC 09/16/17-nonobstructive CAD  . Oropharyngeal dysphagia 01/11/2018  . Type 2 diabetes mellitus with complication, without long-term current use of insulin ([place]) 07/08/2017   Past Surgical History:  Procedure Laterality Date  . BACK SURGERY    .  HERNIA REPAIR    . RIGHT/LEFT HEART CATH AND CORONARY ANGIOGRAPHY N/A 09/16/2017   Procedure: RIGHT/LEFT HEART CATH AND CORONARY ANGIOGRAPHY;  Surgeon: Martinique, Peter M, MD;  Location: [facility];  Service: Cardiovascular;  Laterality: N/A;  . SPINE SURGERY     Family History  Problem Relation Age of Onset  . Stroke Mother   . Kidney disease Father   . Stroke Father   . Hypertension Father   . Early death Father   .  Cancer Neg Hx   . Alcohol abuse Neg Hx   . Diabetes Neg Hx   . Heart disease Neg Hx   . Hyperlipidemia Neg Hx    Social History:  reports that he has never smoked. He has never used smokeless tobacco. He reports that he does not drink alcohol or use drugs. Allergies:  Allergies  Allergen Reactions  . Amoxicillin Hives  . Bactrim [Sulfamethoxazole-Trimethoprim] Other (See Comments)    Dried out his eyes  . Cephalexin Itching  . Hydrocodone Itching  . Lexapro [Escitalopram Oxalate] Other (See Comments)    disorientation  . Lipitor [Atorvastatin] Other (See Comments)    Damage to calf muscles  . Oxycodone Itching  . Statins Other (See Comments)    Muscle pain   . Vytorin [Ezetimibe-Simvastatin] Other (See Comments)    Damage to calf muscles   Medications Prior to Admission  Medication Sig Dispense Refill  . aspirin EC 81 MG tablet Take 1 tablet (81 mg total) by mouth daily. 90 tablet 3  . carvedilol (COREG) 6.25 MG tablet TAKE 1 AND 1/2 TABLETS BY MOUTH TWICE DAILY (Patient taking differently: Take 6.25 mg by mouth in the morning and at bedtime. ) 90 tablet 6  . cholecalciferol (VITAMIN D) 1000 units tablet Take 1,000 Units by mouth daily.    Marland Kitchen esomeprazole (NEXIUM) 40 MG capsule Take 1 capsule (40 mg total) by mouth as needed (for heartburn). 30 capsule 5  . olopatadine (PATANOL) 0.1 % ophthalmic solution Place 1 drop into both eyes as needed for allergies. 5 mL 2  . rosuvastatin (CRESTOR) 5 MG tablet TAKE 1 TABLET BY MOUTH DAILY (Patient taking differently: Take 5 mg by mouth daily. ) 90 tablet 3  . sacubitril-valsartan (ENTRESTO) 24-26 MG Take 1 tablet by mouth 2 (two) times daily. 180 tablet 3  . furosemide (LASIX) 40 MG tablet Take 0.5 tablets (20 mg total) by mouth 3 (three) times a week. (Patient not taking: Reported on 10/12/2019) 30 tablet 6    Drug Regimen Review Drug regimen was reviewed and remains appropriate with no significant issues identified  Home: Home  Living Family/patient expects to be discharged to:: Private residence Living Arrangements: Alone Available Help at Discharge: Family, Available PRN/intermittently(reports ex wife, [name] or [name] (g/f) can help him at home) Type of Home: House Home Access: Stairs to enter [employer] of Steps: 3 Entrance Stairs-Rails: Right Home Layout: One level Bathroom Shower/Tub: Multimedia programmer: [place]: None, Shower seat - built in   Functional History: Prior Function Level of Independence: Independent Comments: Lives with 2 dogs [place] and [place]. Does his own ADLs/IADLs, Working for a [employer]. Drives.  Functional Status:  Mobility: Bed Mobility Overal bed mobility: Needs Assistance Bed Mobility: Supine to Sit Supine to sit: Min guard, HOB elevated General bed mobility comments: + rail use, increased time; dizziness- resolves. VSS; negative for orthostatic BP sitting to standing Transfers Overall transfer level: Needs assistance Equipment used: None Transfers: Sit to/from  Stand Sit to Stand: Mod assist, Min assist Stand pivot transfers: Min assist, +2 physical assistance General transfer comment: increased assist needed upon standin due to impaired balance; cues for safety as pt attempting to stand several times from EOB despite cues to wait for therapist Ambulation/Gait Ambulation/Gait assistance: +2 safety/equipment, Mod assist(chair follow ) Gait Distance (Feet): 50 Feet Assistive device: 1 person hand held assist(assist at trunk with gait belt) Gait Pattern/deviations: Decreased step length - left, Decreased step length - right, Staggering left, Staggering right, Leaning posteriorly General Gait Details: assistance required for balance and weight shifting; cues for increased bilat step lengths and decreased speed; pt moving very quickly and with lateral and posterior LOB  Gait velocity: decreased Gait velocity interpretation: <1.8  ft/sec, indicate of risk for recurrent falls    ADL: ADL Overall ADL's : Needs assistance/impaired Eating/Feeding: Modified independent, Sitting Grooming: Set up, Sitting Upper Body Bathing: Minimal assistance, Sitting Lower Body Bathing: Maximal assistance, Sitting/lateral leans, Sit to/from stand Upper Body Dressing : Minimal assistance, Sitting Lower Body Dressing: Maximal assistance, Sitting/lateral leans, Sit to/from stand Toilet Transfer: Minimal assistance, Stand-pivot, BSC, RW, +2 for physical assistance Toileting- Clothing Manipulation and Hygiene: Moderate assistance, +2 for physical assistance, Sitting/lateral lean, Sit to/from stand, Cueing for sequencing, Cueing for safety Functional mobility during ADLs: Moderate assistance, +2 for physical assistance, Rolling walker, Cueing for safety General ADL Comments: Pt limited by [facility] status, decreased strength, decreased activity tolerance and decreased ability to care for self.  Cognition: Cognition Overall Cognitive Status: Impaired/Different from baseline Orientation Level: Oriented X4 Cognition Arousal/Alertness: Awake/alert Behavior During Therapy: WFL for tasks assessed/performed, Impulsive Overall Cognitive Status: Impaired/Different from baseline Area of Impairment: Problem solving, Awareness, Safety/judgement Safety/Judgement: Decreased awareness of safety, Decreased awareness of deficits Awareness: Intellectual Problem Solving: Requires verbal cues General Comments: HOH. Poor awareness of deficits/safety.  Physical Exam: Blood pressure 110/75, pulse 77, temperature 97.8 F (36.6 C), temperature source Axillary, resp. rate 18, height _0  (1.676 m), weight 74.8 kg, SpO2 97 %. Gen: no distress, normal appearing, sitting up in chair comfortably.  HEENT: oral mucosa pink and moist, NCAT, hard of hearing. Left homonymous hemianopsia. Cardio: Reg rate Chest: normal effort, normal rate of breathing Abd: soft,  non-distended Ext: no edema Skin: intact Neuro/Musculoskeletal: Patient is alert in no acute distress.  Follows commands.  Spells WORLD backward slowly but accurately. Provides his name and age.  He does exhibit some decreased awareness of deficits. 4+/5 strength throughout Psych: pleasant, normal affect  Results for orders placed or performed during the hospital encounter of 02/25/20 (from the past 48 hour(s))  Glucose, capillary     Status: None   Collection Time: 02/27/20 11:57 AM  Result Value Ref Range   Glucose-Capillary 92 70 - 99 mg/dL    Comment: Glucose reference range applies only to samples taken after fasting for at least 8 hours.   Comment 1 Notify RN    Comment 2 Document in Chart   Glucose, capillary     Status: None   Collection Time: 02/27/20  8:56 PM  Result Value Ref Range   Glucose-Capillary 82 70 - 99 mg/dL    Comment: Glucose reference range applies only to samples taken after fasting for at least 8 hours.   CT HEAD WO CONTRAST  Result Date: 02/27/2020 CLINICAL DATA:  Diabetes, hypertension, stroke EXAM: CT HEAD WITHOUT CONTRAST TECHNIQUE: Contiguous axial images were obtained from the base of the skull through the vertex without intravenous contrast. COMPARISON:  02/26/2020, 02/25/2020 FINDINGS:  Brain: Continued evolution of the right occipital infarct seen previously. The subtle hyperdensity within the sulci in the right parietooccipital region just superior to the infarct bed again identified, consistent with petechial hemorrhage and unchanged since prior exam. There are stable hypodensities throughout the periventricular white matter consistent with chronic small vessel ischemic changes. Lateral ventricles and midline structures are stable. No acute extra-axial fluid collections. No mass effect. Vascular: No hyperdense vessel or unexpected calcification. Skull: Normal. Negative for fracture or focal lesion. Sinuses/Orbits: No acute finding. Other: None. IMPRESSION: 1.  Stable right PCA territory infarct involving the right occipital lobe. 2. Stable petechial hemorrhage within the right parietooccipital sulci just superior to the infarct bed. Electronically Signed   By: Randa Ngo M.D.   On: 02/27/2020 02:53   ECHOCARDIOGRAM COMPLETE  Result Date: 02/26/2020    ECHOCARDIOGRAM REPORT   Patient Name:   Christian [name] Date of Exam: 02/26/2020 Medical Rec #:  [id number]     Height:       66.0 in Accession #:    [id number]    Weight:       165.0 lb Date of Birth:  02-12-1941     BSA:          1.843 m Patient Age:    52 years      BP:           102/68 mmHg Patient Gender: M             HR:           91 bpm. Exam Location:  Inpatient Procedure: 2D Echo Indications:    CVA  History:        Patient has prior history of Echocardiogram examinations, most                 recent 08/04/2018. CAD; Risk Factors:Hypertension, Diabetes and                 Dyslipidemia.  Sonographer:    Johny Chess Referring Phys: [phone] BRIAN MILLER  Sonographer Comments: Technically difficult study due to poor echo windows. IMPRESSIONS  1. Left ventricular ejection fraction, by estimation, is 60 to 65%. The left ventricle has normal function. The left ventricle has no regional wall motion abnormalities. Left ventricular diastolic function could not be evaluated.  2. Right ventricular systolic function is normal. The right ventricular size is normal.  3. The mitral valve is grossly normal. No evidence of mitral valve regurgitation. No evidence of mitral stenosis.  4. The aortic valve is tricuspid. Aortic valve regurgitation is not visualized. Mild aortic valve sclerosis is present, with no evidence of aortic valve stenosis.  5. Aortic dilatation noted. There is moderate dilatation of the ascending aorta measuring 42 mm. FINDINGS  Left Ventricle: Left ventricular ejection fraction, by estimation, is 60 to 65%. The left ventricle has normal function. The left ventricle has no regional wall motion abnormalities.  The left ventricular internal cavity size was normal in size. There is  no left ventricular hypertrophy. Left ventricular diastolic function could not be evaluated. Right Ventricle: The right ventricular size is normal. No increase in right ventricular wall thickness. Right ventricular systolic function is normal. Left Atrium: Left atrial size was normal in size. Right Atrium: Right atrial size was not well visualized. Pericardium: Trivial pericardial effusion is present. Mitral Valve: The mitral valve is grossly normal. No evidence of mitral valve regurgitation. No evidence of mitral valve stenosis. Tricuspid Valve: The tricuspid valve is normal in structure. Tricuspid  valve regurgitation is trivial. No evidence of tricuspid stenosis. Aortic Valve: The aortic valve is tricuspid. . There is mild thickening and mild calcification of the aortic valve. Aortic valve regurgitation is not visualized. Mild aortic valve sclerosis is present, with no evidence of aortic valve stenosis. There is mild thickening of the aortic valve. There is mild calcification of the aortic valve. Pulmonic Valve: The pulmonic valve was grossly normal. Pulmonic valve regurgitation is trivial. No evidence of pulmonic stenosis. Aorta: Aortic dilatation noted. There is moderate dilatation of the ascending aorta measuring 42 mm. IAS/Shunts: The atrial septum is grossly normal.  LEFT VENTRICLE PLAX 2D LVIDd:         4.10 cm LVIDs:         2.70 cm LV PW:         1.20 cm LV IVS:        1.10 cm LVOT diam:     2.10 cm LV SV:         39 LV SV Index:   21 LVOT Area:     3.46 cm  LEFT ATRIUM           Index LA diam:      4.20 cm 2.28 cm/m LA Vol (A4C): 43.9 ml 23.82 ml/m  AORTIC VALVE LVOT Vmax:   61.80 cm/s LVOT Vmean:  39.800 cm/s LVOT VTI:    0.112 m  AORTA Ao Root diam: 3.60 cm Ao Asc diam:  4.20 cm MV E velocity: 79.50 cm/s MV A velocity: 125.00 cm/s  SHUNTS MV E/A ratio:  0.64         Systemic VTI:  0.11 m                             Systemic Diam:  2.10 cm Mertie Moores MD Electronically signed by Mertie Moores MD Signature Date/Time: 02/26/2020/12:54:14 PM    Final        Medical Problem List and Plan: 1.  Mild left hemiparesis with sensory deficits and dizziness secondary to right PCA acute infarct with severe stenosis of proximal mid right P2 segment, with markedly attenuated flow within the right PCA distally.  Moderate to severe proximal left P2 stenosis  -patient may shower  -ELOS/Goals: modI 12-14 days  -Admit to CIR 2.  Antithrombotics: -DVT/anticoagulation: Lovenox initiated 02/29/2020  -antiplatelet therapy: Aspirin 81 mg daily and Plavix 75 mg daily x3 months then aspirin alone 3. Pain Management: Tylenol as needed. Well controlled.  4. Mood: Provide emotional support  -antipsychotic agents: N/A 5. Neuropsych: This patient is capable of making decisions on his own behalf. 6. Skin/Wound Care: Routine skin checks 7. Fluids/Electrolytes/Nutrition: Routine in and outs with follow-up chemistries 8.  Chronic systolic congestive heart failure.  Lasix 20 mg 3 times weekly Monday Wednesday Friday, Entresto 24-26 mg twice daily.  Monitor for any signs of fluid overload 9.  Hypertension.  Coreg 6.25 mg twice daily.  Monitor with increased mobility. Well controlled.  10.  CAD/nonischemic cardiomyopathy.  Continue Coreg 11.  Diet-controlled diabetes mellitus.  Latest hemoglobin A1c 6.4.  Latest blood sugars 82-82-92.  CBGs discontinued 12.  Hyperlipidemia.  Crestor 27m. Do not increase dose given side effects of myalgias.  13.  GERD.  Nexium  DLavon PaganiniAngiulli, PA-C 02/28/2020   I have personally performed a face to face diagnostic evaluation, including, but not limited to relevant history and physical exam findings, of this patient and developed relevant assessment and plan.  Additionally, I have reviewed and concur with the physician assistant's documentation above.  Leeroy Cha, MD

## 2020-02-28 NOTE — Plan of Care (Signed)
  Problem: Self-Care: Goal: Ability to communicate needs accurately will improve Outcome: Progressing   Problem: Nutrition: Goal: Risk of aspiration will decrease Outcome: Progressing   

## 2020-02-28 NOTE — Progress Notes (Signed)
Physical Therapy Treatment Patient Details Name: Christian [name] MRN: [id number] DOB: [birth year] Today's Date: 02/28/2020    History of Present Illness Patient is a 79 y/o male who presents with "feeling foggy," dizziness and numbness in BLEs. Brain MRI- right PCA infarct. HEad CT- possible bleed. PMH includes neuropathy involving BLEs, DM, HTN, HLD, depression, anxiety,CAD and heart failure.    PT Comments    Patient able to ambulate ~65 ft with mod A +2 HHA. Pt continues to present with impaired balance, significant gait deviations, and L hemianopsia putting pt at a risk for falls.  Continue to recommend CIR for further skilled PT services to maximize independence and safety with mobility.    Follow Up Recommendations  CIR;Supervision for mobility/OOB;Supervision/Assistance - 24 hour     Equipment Recommendations  Other (comment)(defer to next venue)    Recommendations for Other Services       Precautions / Restrictions Precautions Precautions: Fall Precaution Comments: L hemianopsia    Mobility  Bed Mobility Overal bed mobility: Needs Assistance Bed Mobility: Supine to Sit     Supine to sit: Max assist     General bed mobility comments: cues for sequencing and hand over hand assist to find L bed rail; pt requires use of staff and rails. pt pulling with R UE  Transfers Overall transfer level: Needs assistance   Transfers: Sit to/from Stand Sit to Stand: +2 physical assistance;Mod assist         General transfer comment: assist to power up into standing and for balance   Ambulation/Gait Ambulation/Gait assistance: Mod assist;+2 physical assistance;+2 safety/equipment Gait Distance (Feet): 65 Feet(seated break at sink in room) Assistive device: 2 person hand held assist(assist at trunk with gait belt) Gait Pattern/deviations: Step-to pattern;Decreased step length - left;Steppage     General Gait Details: pt with short, shuffling steps and with cues for increased  bilat step length demonstrates steppage-like gait R LE and short L steps leaving L LE behind; without +2 HHA pt reaching with L UE to R side for object to hold onto; assistance required for balance throughout    Stairs             Wheelchair Mobility    Modified Rankin (Stroke Patients Only) Modified Rankin (Stroke Patients Only) Pre-Morbid Rankin Score: No significant disability Modified Rankin: Moderately severe disability     Balance Overall balance assessment: History of Falls         Standing balance support: Single extremity supported;During functional activity Standing balance-Leahy Scale: Poor Standing balance comment: reliant on OT for external (A)                             Cognition Arousal/Alertness: Awake/alert Behavior During Therapy: WFL for tasks assessed/performed;Impulsive Overall Cognitive Status: Impaired/Different from baseline Area of Impairment: Attention;Safety/judgement;Following commands;Awareness;Problem solving                   Current Attention Level: Selective   Following Commands: Follows multi-step commands inconsistently Safety/Judgement: Decreased awareness of safety;Decreased awareness of deficits Awareness: Emergent Problem Solving: Slow processing;Difficulty sequencing General Comments: Pt reading phone number as only 4 digits. pt asked how many digits are in phone number. pt states 7 digits. pt requires mod cues to scan L to read entire phoen number. pt motivated to read x2 caregiver phone numbers in hand. pt attempting initially to read upside down and OT had to cue to correctly position paper  Exercises      General Comments        Pertinent Vitals/Pain Pain Assessment: Faces Faces Pain Scale: Hurts little more Pain Location: L foot Pain Descriptors / Indicators: Constant Pain Intervention(s): Repositioned;Monitored during session    Home Living     Available Help at Discharge:  Family;Available PRN/intermittently                Prior Function            PT Goals (current goals can now be found in the care plan section) Acute Rehab PT Goals Patient Stated Goal: to have water and phone numbers on my table Progress towards PT goals: Progressing toward goals    Frequency    Min 4X/week      PT Plan Current plan remains appropriate    Co-evaluation PT/OT/SLP Co-Evaluation/Treatment: Yes Reason for Co-Treatment: Complexity of the patient's impairments (multi-system involvement);For patient/therapist safety;To address functional/ADL transfers PT goals addressed during session: Mobility/safety with mobility;Balance OT goals addressed during session: ADL's and self-care;Strengthening/ROM;Proper use of Adaptive equipment and DME      AM-PAC PT "6 Clicks" Mobility   Outcome Measure  Help needed turning from your back to your side while in a flat bed without using bedrails?: A Lot Help needed moving from lying on your back to sitting on the side of a flat bed without using bedrails?: A Lot Help needed moving to and from a bed to a chair (including a wheelchair)?: A Lot Help needed standing up from a chair using your arms (e.g., wheelchair or bedside chair)?: A Lot Help needed to walk in hospital room?: A Lot Help needed climbing 3-5 steps with a railing? : A Lot 6 Click Score: 12    End of Session Equipment Utilized During Treatment: Gait belt Activity Tolerance: Patient tolerated treatment well Patient left: in chair;with call bell/phone within reach;with chair alarm set Nurse Communication: Mobility status PT Visit Diagnosis: Muscle weakness (generalized) (M62.81);Unsteadiness on feet (R26.81);Difficulty in walking, not elsewhere classified (R26.2)     Time: [id number] PT Time Calculation (min) (ACUTE ONLY): 28 min  Charges:  $Gait Training: 8-22 mins                     Earney Navy, PTA Acute Rehabilitation Services Pager: [phone] Office: [phone]     Darliss Cheney 02/28/2020, 3:03 PM

## 2020-02-28 NOTE — Progress Notes (Signed)
Family Medicine Teaching Service Daily Progress Note Intern Pager: [phone]  Patient name: Christian [name] Medical record number: [id number] Date of birth: [birth year] Age: 79 y.o. Gender: male  Primary Care Provider: Maximiano Coss, NP Consultants: Neurology Code Status: Partial   Pt Overview and Major Events to Date:  5/09: Admitted  Assessment and Plan:  Acute right PCA CVA   Stroke team signed off on 5/10. Recommended ASA 81mg  and Plavix 75mg  daily for 3 months followed by ASA alone with follow up in stroke clinic ic 6 weeks. Patient is now medically stable for discharge to CIR.   Aspirin 81mg  and Plavix 75mg  once daily as above   Rosuvastatin 5 mg  Evaluated by CIR, who feel he is a good candidate for rehab   Type 2 diabetes Glucose 82. Diet controlled.  Most recent A1c was 6.4. -Monitor blood glucose with BMPs -When allowed to eat, carb modified diet  Hyperlipidemia Most recent lipid panel with LDL 74. -Continue rosuvastatin 5 mg -Will speak to patient about switching to high dose rosuvastatin every other day to avoid myalgia symptoms   CAD Home meds: 9.375mg  carvedilol BID, aspirin 81mg  once daily -Aspirin 81 mg once daily  -Continue home carvedilol  Essential hypertension BP 110/75 -Continue carvedilol -Monitor blood pressures  HFrEF Echo on 5/9: EF 60-65% Home meds: Entresto 24-26mg  BID, Lasix 20 mg 3 times daily -Continue home Entresto and Lasix  Lower extremity neuropathy Chronic. Unsure if related to diabetic neuropathy or other cause.  It is bilateral.  Patient endorsing worsening due to recent CVA.  GERD Takes omeprazole at home -Continue PPI  FEN/GI: Carb modified diet, Nexium  PPx: SCDs  Disposition:  CIR   Subjective:  No acute concerns this morning.   Objective: Temp:  [97.7 F (36.5 C)-99.2 F (37.3 C)] 97.8 F (36.6 C) (05/11 0345) Pulse Rate:  [77-87] 77 (05/10 1510) Resp:  [16-18] 18 (05/10 2347) BP: (110-122)/(69-87)  110/75 (05/10 2347) SpO2:  [94 %-99 %] 97 % (05/10 2347)   Physical Exam: General: Alert, sitting up in bed eating breakfast, no acute distress Cardio: Normal S1 and S2, RRR Abdomen: Bowel sounds normal. Abdomen soft and non-tender.  Extremities: No peripheral edema. Warm/ well perfused. Neuro: Cranial nerves grossly intact  Laboratory: Recent Labs  Lab 02/24/20 1013 02/26/20 0132  WBC 6.3 7.9  HGB 15.4 14.8  HCT 46.1 46.3  PLT 202 204   Recent Labs  Lab 02/24/20 1013 02/26/20 0132  NA 141 135  K 4.4 4.5  CL 105 103  CO2 22 22  BUN 17 16  CREATININE 0.94 0.95  CALCIUM 9.1 8.9  PROT 6.5 6.5  BILITOT 0.9 1.4*  ALKPHOS 50 42  ALT 13 17  AST 16 18  GLUCOSE 116* 149*     Imaging/Diagnostic Tests: CT HEAD WO CONTRAST  Result Date: 02/27/2020 CLINICAL DATA:  Diabetes, hypertension, stroke EXAM: CT HEAD WITHOUT CONTRAST TECHNIQUE: Contiguous axial images were obtained from the base of the skull through the vertex without intravenous contrast. COMPARISON:  02/26/2020, 02/25/2020 FINDINGS: Brain: Continued evolution of the right occipital infarct seen previously. The subtle hyperdensity within the sulci in the right parietooccipital region just superior to the infarct bed again identified, consistent with petechial hemorrhage and unchanged since prior exam. There are stable hypodensities throughout the periventricular white matter consistent with chronic small vessel ischemic changes. Lateral ventricles and midline structures are stable. No acute extra-axial fluid collections. No mass effect. Vascular: No hyperdense vessel or unexpected calcification. Skull: Normal. Negative  for fracture or focal lesion. Sinuses/Orbits: No acute finding. Other: None. IMPRESSION: 1. Stable right PCA territory infarct involving the right occipital lobe. 2. Stable petechial hemorrhage within the right parietooccipital sulci just superior to the infarct bed. Electronically Signed   By: Randa Ngo  M.D.   On: 02/27/2020 02:53   ECHOCARDIOGRAM COMPLETE  Result Date: 02/26/2020    ECHOCARDIOGRAM REPORT   Patient Name:   Christian [name] Date of Exam: 02/26/2020 Medical Rec #:  [id number]     Height:       66.0 in Accession #:    [id number]    Weight:       165.0 lb Date of Birth:  23-May-1941     BSA:          1.843 m Patient Age:    11 years      BP:           102/68 mmHg Patient Gender: M             HR:           91 bpm. Exam Location:  Inpatient Procedure: 2D Echo Indications:    CVA  History:        Patient has prior history of Echocardiogram examinations, most                 recent 08/04/2018. CAD; Risk Factors:Hypertension, Diabetes and                 Dyslipidemia.  Sonographer:    Johny Chess Referring Phys: [phone] BRIAN MILLER  Sonographer Comments: Technically difficult study due to poor echo windows. IMPRESSIONS  1. Left ventricular ejection fraction, by estimation, is 60 to 65%. The left ventricle has normal function. The left ventricle has no regional wall motion abnormalities. Left ventricular diastolic function could not be evaluated.  2. Right ventricular systolic function is normal. The right ventricular size is normal.  3. The mitral valve is grossly normal. No evidence of mitral valve regurgitation. No evidence of mitral stenosis.  4. The aortic valve is tricuspid. Aortic valve regurgitation is not visualized. Mild aortic valve sclerosis is present, with no evidence of aortic valve stenosis.  5. Aortic dilatation noted. There is moderate dilatation of the ascending aorta measuring 42 mm. FINDINGS  Left Ventricle: Left ventricular ejection fraction, by estimation, is 60 to 65%. The left ventricle has normal function. The left ventricle has no regional wall motion abnormalities. The left ventricular internal cavity size was normal in size. There is  no left ventricular hypertrophy. Left ventricular diastolic function could not be evaluated. Right Ventricle: The right ventricular size is  normal. No increase in right ventricular wall thickness. Right ventricular systolic function is normal. Left Atrium: Left atrial size was normal in size. Right Atrium: Right atrial size was not well visualized. Pericardium: Trivial pericardial effusion is present. Mitral Valve: The mitral valve is grossly normal. No evidence of mitral valve regurgitation. No evidence of mitral valve stenosis. Tricuspid Valve: The tricuspid valve is normal in structure. Tricuspid valve regurgitation is trivial. No evidence of tricuspid stenosis. Aortic Valve: The aortic valve is tricuspid. . There is mild thickening and mild calcification of the aortic valve. Aortic valve regurgitation is not visualized. Mild aortic valve sclerosis is present, with no evidence of aortic valve stenosis. There is mild thickening of the aortic valve. There is mild calcification of the aortic valve. Pulmonic Valve: The pulmonic valve was grossly normal. Pulmonic valve regurgitation is trivial. No evidence of  pulmonic stenosis. Aorta: Aortic dilatation noted. There is moderate dilatation of the ascending aorta measuring 42 mm. IAS/Shunts: The atrial septum is grossly normal.  LEFT VENTRICLE PLAX 2D LVIDd:         4.10 cm LVIDs:         2.70 cm LV PW:         1.20 cm LV IVS:        1.10 cm LVOT diam:     2.10 cm LV SV:         39 LV SV Index:   21 LVOT Area:     3.46 cm  LEFT ATRIUM           Index LA diam:      4.20 cm 2.28 cm/m LA Vol (A4C): 43.9 ml 23.82 ml/m  AORTIC VALVE LVOT Vmax:   61.80 cm/s LVOT Vmean:  39.800 cm/s LVOT VTI:    0.112 m  AORTA Ao Root diam: 3.60 cm Ao Asc diam:  4.20 cm MV E velocity: 79.50 cm/s MV A velocity: 125.00 cm/s  SHUNTS MV E/A ratio:  0.64         Systemic VTI:  0.11 m                             Systemic Diam: 2.10 cm Mertie Moores MD Electronically signed by Mertie Moores MD Signature Date/Time: 02/26/2020/12:54:14 PM    Final     Lattie Haw, MD 02/28/2020, 7:31 AM PGY-1, [place] Intern  pager: [phone], text pages welcome

## 2020-02-29 LAB — GLUCOSE, CAPILLARY
Glucose-Capillary: 105 mg/dL — ABNORMAL HIGH (ref 70–99)
Glucose-Capillary: 105 mg/dL — ABNORMAL HIGH (ref 70–99)
Glucose-Capillary: 111 mg/dL — ABNORMAL HIGH (ref 70–99)
Glucose-Capillary: 90 mg/dL (ref 70–99)

## 2020-02-29 MED ORDER — CARVEDILOL 6.25 MG PO TABS
6.2500 mg | ORAL_TABLET | Freq: Two times a day (BID) | ORAL | Status: DC
  Administered 2020-02-29 – 2020-03-01 (×3): 6.25 mg via ORAL
  Filled 2020-02-29 (×3): qty 1

## 2020-02-29 MED ORDER — ENOXAPARIN SODIUM 40 MG/0.4ML ~~LOC~~ SOLN
40.0000 mg | SUBCUTANEOUS | Status: DC
  Administered 2020-02-29 – 2020-03-01 (×2): 40 mg via SUBCUTANEOUS
  Filled 2020-02-29 (×2): qty 0.4

## 2020-02-29 NOTE — Evaluation (Signed)
Speech Language Pathology Evaluation Patient Details Name: Christian [name] MRN: [id number] DOB: [birth year] Today's Date: 02/29/2020 Time: 6440-3474 SLP Time Calculation (min) (ACUTE ONLY): 24 min  Problem List:  Patient Active Problem List   Diagnosis Date Noted  . CVA (cerebral vascular accident) ([place]) 02/26/2020  . Allergic rhinitis due to pollen 03/29/2018  . Oropharyngeal dysphagia 01/11/2018  . GERD with esophagitis 01/11/2018  . Claudication of both lower extremities ([place]) 01/06/2018  . Nonischemic cardiomyopathy ([place]) 11/11/2017  . Coronary artery disease involving native coronary artery of native heart without angina pectoris 11/11/2017  . Chronic systolic heart failure ([place])   . Essential hypertension 07/08/2017  . Depression with anxiety 07/08/2017  . Type 2 diabetes mellitus with complication, without long-term current use of insulin ([place]) 07/08/2017  . Hyperlipidemia LDL goal <130 05/25/2017   Past Medical History:  Past Medical History:  Diagnosis Date  . Allergic rhinitis due to pollen 03/29/2018  . Chronic systolic heart failure ([facility]) - EF improved to normal on Rx    Echo 11/18: Mild LVH, EF 20, diffuse HK mild AI, a sending aorta 42 mm (mildly dilated), mild MR, mild TR, trivial PI, PASP 44 // Echo 3/19: mild LVH, EF 25-30, diff HK, Gr 1 DD, asc aorta 43 mm (mildly dilated), MAC, mild reduced RVSF // Limited echo 10/19: Mild concentric LVH, EF 25-95, grade 1 diastolic dysfunction, MAC, mild LAE   . Coronary artery disease involving native coronary artery of native heart without angina pectoris 11/11/2017   R/L HC 11/18: pLAD 45, oD1 65 ; elevated PCWP (mean 29), moderate pulmonary hypertension, CO 3.66, CI 2.07  . Depression with anxiety 07/08/2017  . Essential hypertension 07/08/2017  . GERD with esophagitis 01/11/2018  . Hernia of abdominal cavity   . Hyperlipidemia   . Hyperlipidemia LDL goal <130 05/25/2017   The 10-year ASCVD risk score Mikey Bussing DC Jr., et al., 2013) is:  31.1%   Values used to calculate the score:     Age: 17 years     Sex: Male     Is Non-Hispanic African American: No     Diabetic: No     Tobacco smoker: No     Systolic Blood Pressure: 638 mmHg     Is BP treated: No     HDL Cholesterol: 68.7 mg/dL     Total Cholesterol: 258 mg/dL   . Idiopathic inflammatory myopathy 05/25/2017  . Neuropathy involving both lower extremities 05/25/2017  . Nonischemic cardiomyopathy ([place]) 11/11/2017   LHC 09/16/17-nonobstructive CAD  . Oropharyngeal dysphagia 01/11/2018  . Type 2 diabetes mellitus with complication, without long-term current use of insulin ([place]) 07/08/2017   Past Surgical History:  Past Surgical History:  Procedure Laterality Date  . BACK SURGERY    . HERNIA REPAIR    . RIGHT/LEFT HEART CATH AND CORONARY ANGIOGRAPHY N/A 09/16/2017   Procedure: RIGHT/LEFT HEART CATH AND CORONARY ANGIOGRAPHY;  Surgeon: Martinique, Peter M, MD;  Location: [facility];  Service: Cardiovascular;  Laterality: N/A;  . SPINE SURGERY     HPI:  Pt is a 79 y.o. male with past history of chronic systolic heart failure, coronary artery disease, hypertension, hyperlipidemia, bilateral lower extremity neuropathy, diabetes, who presented to the ED with complaints "feeling foggy" while doing things. MRI brain: Patchy acute infarct in the right PCA territory. Remote left occipital cortex infarct and remote bilateral superficial occipital hemorrhage.   Assessment / Plan / Recommendation Clinical Impression  Pt participated in speech/language/cognition evaluation. He denied any baseline deficits  in speech, language, or cognition and initially denied any acute changes in cognition. However, at the end of the evaluation he stated that he believed his performance was fair to poor compared to his baseline. [facility] Mental Status Examination was completed to evaluate the pt's cognitive-linguistic skills. He achieved a score of 22/30 which is below the normal limits of 27 or  more out of 30 and is suggestive of a mild impairment. He exhibited deficits in the areas of awareness, sustained attention, memory, executive function, and complex problem solving. Skilled SLP services are clinically indicated at this time to improve cognitive-linguistic function.    SLP Assessment  SLP Recommendation/Assessment: Patient needs continued Speech Lanaguage Pathology Services SLP Visit Diagnosis: Cognitive communication deficit (R41.841)    Follow Up Recommendations  Inpatient Rehab    Frequency and Duration min 2x/week  2 weeks      SLP Evaluation Cognition  Overall Cognitive Status: Impaired/Different from baseline Arousal/Alertness: Awake/alert Orientation Level: Oriented X4 Attention: Focused;Sustained Focused Attention: Appears intact Sustained Attention: Appears intact Memory: Impaired Memory Impairment: Retrieval deficit;Storage deficit;Decreased recall of new information(Immediate: 5/5 with repetition; delayed: 2/5; with cues 2/3) Awareness: Impaired Awareness Impairment: Emergent impairment Problem Solving: Impaired Problem Solving Impairment: Verbal complex Executive Function: Reasoning;Sequencing Reasoning: Impaired Reasoning Impairment: Verbal complex       Comprehension  Auditory Comprehension Overall Auditory Comprehension: Appears within functional limits for tasks assessed Yes/No Questions: Within Functional Limits Commands: Within Functional Limits Conversation: Complex Visual Recognition/Discrimination Discrimination: Within Function Limits Reading Comprehension Reading Status: Within funtional limits    Expression Expression Primary Mode of Expression: Verbal Verbal Expression Overall Verbal Expression: Appears within functional limits for tasks assessed Initiation: No impairment Level of Generative/Spontaneous Verbalization: Conversation Repetition: No impairment Naming: No impairment Pragmatics: No impairment Written  Expression Dominant Hand: Right Written Expression: (Difficulty drawing clock: 0/4)   Oral / Motor  Oral Motor/Sensory Function Overall Oral Motor/Sensory Function: Within functional limits Motor Speech Overall Motor Speech: Appears within functional limits for tasks assessed Respiration: Within functional limits Phonation: Normal Resonance: Within functional limits Articulation: Within functional limitis Intelligibility: Intelligible Motor Planning: Witnin functional limits Motor Speech Errors: Not applicable   Bertrand Vowels I. Hardin Negus, [place], [place] Office number [phone] Pager [place] 02/29/2020, 4:44 PM

## 2020-02-29 NOTE — Progress Notes (Signed)
  Speech Language Pathology Treatment: Cognitive-Linquistic  Patient Details Name: Christian [name] MRN: [id number] DOB: 11/27/1940 Today's Date: 02/29/2020 Time: [id number] SLP Time Calculation (min) (ACUTE ONLY): 21 min  Assessment / Plan / Recommendation Clinical Impression  Pt was seen for cognitive-linguistic treatment session. He was educated regarding his deficits, compensatory strategies and the goals of care. He verbalized understanding. He demonstrated 60% accuracy with an executive function (prescription) task increasing to 100% with verbal prompts. He required cues for sustained attention during this task. He demonstrated 30% accuracy with recall of information from voicemail despite reported use of internal memory aids. SLP will continue to follow pt.    HPI HPI: Pt is a 79 y.o. male with past history of chronic systolic heart failure, coronary artery disease, hypertension, hyperlipidemia, bilateral lower extremity neuropathy, diabetes, who presented to the ED with complaints "feeling foggy" while doing things. MRI brain: Patchy acute infarct in the right PCA territory. Remote left occipital cortex infarct and remote bilateral superficial occipital hemorrhage.      SLP Plan  Continue with current plan of care  Patient needs continued Speech Lanaguage Pathology Services    Recommendations                   Follow up Recommendations: Inpatient Rehab SLP Visit Diagnosis: Cognitive communication deficit [id number]) Plan: Continue with current plan of care       Evin Chirco I. Hardin Negus, [place], [place] Office number [phone] Pager [place] 02/29/2020, 5:03 PM

## 2020-02-29 NOTE — Progress Notes (Signed)
Family Medicine Teaching Service Daily Progress Note Intern Pager: [phone]  Patient name: Christian [name] Medical record number: [id number] Date of birth: 1940/10/24 Age: 79 y.o. Gender: male  Primary Care Provider: Maximiano Coss, NP Consultants: Neurology Code Status: Partial   Pt Overview and Major Events to Date:  5/09: Admitted  Assessment and Plan:  Acute right PCA CVA   Stroke team signed off on 5/10. Recommended ASA 81mg  and Plavix 75mg  daily for 3 months followed by ASA alone with follow up in stroke clinic ic 6 weeks. Patient is now medically stable for discharge to CIR.   Aspirin 81mg  and Plavix 75mg  once daily as above   Rosuvastatin 5 mg  Evaluated by CIR, who feel he is a good candidate for rehab   Type 2 diabetes Glucose 105. Diet controlled.  Most recent A1c was 6.4. -Monitor blood glucose with BMPs -When allowed to eat, carb modified diet  Hyperlipidemia Most recent lipid panel with LDL 74. Spoke with patient about starting high intensity statin at 20 mg.  Patient declined due to previous side effects of myalgias and cough cramps.  Offered patient high-density statin every other day but still declined. Pharmacy team will speak with patient later today different options for hyperlipidemia. -Continue rosuvastatin 5 mg  CAD Home meds: 9.375mg  carvedilol BID, aspirin 81mg  once daily -Aspirin 81 mg once daily  -Continue home carvedilol  Essential hypertension BP 114/101 -Continue carvedilol -Monitor blood pressures  HFrEF Echo on 5/9: EF 60-65% Home meds: Entresto 24-26mg  BID, Lasix 20 mg 3 times daily -Continue home Entresto and Lasix  Lower extremity neuropathy Chronic. Unsure if related to diabetic neuropathy or other cause.  It is bilateral.  Patient endorsing worsening due to recent CVA.  GERD Takes omeprazole at home -Continue PPI  FEN/GI: Carb modified diet, Nexium  PPx: SCDs  Disposition:  CIR   Subjective:  No acute events  overnight.    Objective: Temp:  [97.3 F (36.3 C)-97.9 F (36.6 C)] 97.3 F (36.3 C) (05/12 0408) Pulse Rate:  [77-99] 77 (05/12 0408) Resp:  [16-20] 18 (05/12 0408) BP: (90-113)/(62-76) 97/71 (05/12 0408) SpO2:  [94 %-98 %] 98 % (05/12 0408)   Physical Exam: General: Alert, 79 yr old male, no acute distress Cardio: Normal S1 and S2, RRR Pulm: CTAB, normal WOB  Abdomen: Bowel sounds normal. Abdomen soft and non-tender.  Extremities: No peripheral edema. Warm/ well perfused.  Strong radial pulse Neuro: Cranial nerves grossly intact  Laboratory: Recent Labs  Lab 02/24/20 1013 02/26/20 0132  WBC 6.3 7.9  HGB 15.4 14.8  HCT 46.1 46.3  PLT 202 204   Recent Labs  Lab 02/24/20 1013 02/26/20 0132  NA 141 135  K 4.4 4.5  CL 105 103  CO2 22 22  BUN 17 16  CREATININE 0.94 0.95  CALCIUM 9.1 8.9  PROT 6.5 6.5  BILITOT 0.9 1.4*  ALKPHOS 50 42  ALT 13 17  AST 16 18  GLUCOSE 116* 149*     Imaging/Diagnostic Tests: No results found.  Lattie Haw, MD 02/29/2020, 7:48 AM PGY-1, [employer] Intern pager: [phone], text pages welcome

## 2020-02-29 NOTE — Progress Notes (Signed)
Physical Therapy Treatment Patient Details Name: Christian [name] MRN: [id number] DOB: [birth year] Today's Date: 02/29/2020    History of Present Illness Patient is a 79 y/o male who presents with "feeling foggy," dizziness and numbness in BLEs. Brain MRI- right PCA infarct. HEad CT- possible bleed. PMH includes neuropathy involving BLEs, DM, HTN, HLD, depression, anxiety,CAD and heart failure.    PT Comments    Patient continues to make steady progress toward PT goals and tolerated session well. Given current deficits and high risk for falls continue to recommend CIR for further skilled PT services to maximize independence and safety with mobility.    Follow Up Recommendations  CIR;Supervision for mobility/OOB;Supervision/Assistance - 24 hour     Equipment Recommendations  Other (comment)(defer to next venue)    Recommendations for Other Services       Precautions / Restrictions Precautions Precautions: Fall Precaution Comments: L hemianopsia Restrictions Weight Bearing Restrictions: No    Mobility  Bed Mobility Overal bed mobility: Needs Assistance Bed Mobility: Supine to Sit     Supine to sit: Min assist     General bed mobility comments: multimodal cues for sequencing going toward L side and increased time for pt to find L rail to assist in rolling; assist to elevate trunk into sitting  Transfers Overall transfer level: Needs assistance Equipment used: 2 person hand held assist Transfers: Sit to/from Stand Sit to Stand: +2 physical assistance;Mod assist         General transfer comment: assist to power up into standing and for balance   Ambulation/Gait Ambulation/Gait assistance: Mod assist;+2 physical assistance;+2 safety/equipment Gait Distance (Feet): 75 Feet Assistive device: 2 person hand held assist(assist at trunk with gait belt) Gait Pattern/deviations: Step-to pattern;Decreased step length - left;Step-through pattern;Decreased step length - right      General Gait Details: anterior lean at times; cues for gait speed and bilat step lengths; improving step length symmetry noted today; shuffling gait when turning/directional changes   Stairs             Wheelchair Mobility    Modified Rankin (Stroke Patients Only) Modified Rankin (Stroke Patients Only) Pre-Morbid Rankin Score: No significant disability Modified Rankin: Moderately severe disability     Balance Overall balance assessment: History of Falls Sitting-balance support: Feet supported Sitting balance-Leahy Scale: Fair     Standing balance support: During functional activity;Bilateral upper extremity supported;Single extremity supported Standing balance-Leahy Scale: Poor Standing balance comment: pt continues to reach to R side with L UE to hold onto something if standing/ambulating with single UE only                            Cognition Arousal/Alertness: Awake/alert Behavior During Therapy: WFL for tasks assessed/performed;Impulsive Overall Cognitive Status: Impaired/Different from baseline Area of Impairment: Attention;Safety/judgement;Following commands;Awareness;Problem solving;Memory                   Current Attention Level: Selective Memory: Decreased short-term memory Following Commands: Follows multi-step commands inconsistently Safety/Judgement: Decreased awareness of safety;Decreased awareness of deficits Awareness: Emergent Problem Solving: Slow processing;Difficulty sequencing        Exercises      General Comments        Pertinent Vitals/Pain Pain Assessment: No/denies pain    Home Living                      Prior Function  PT Goals (current goals can now be found in the care plan section) Progress towards PT goals: Progressing toward goals    Frequency    Min 4X/week      PT Plan Current plan remains appropriate    Co-evaluation              AM-PAC PT "6 Clicks"  Mobility   Outcome Measure  Help needed turning from your back to your side while in a flat bed without using bedrails?: A Little Help needed moving from lying on your back to sitting on the side of a flat bed without using bedrails?: A Little Help needed moving to and from a bed to a chair (including a wheelchair)?: A Lot Help needed standing up from a chair using your arms (e.g., wheelchair or bedside chair)?: A Lot Help needed to walk in hospital room?: A Lot Help needed climbing 3-5 steps with a railing? : A Lot 6 Click Score: 14    End of Session Equipment Utilized During Treatment: Gait belt Activity Tolerance: Patient tolerated treatment well Patient left: in chair;with call bell/phone within reach;with chair alarm set Nurse Communication: Mobility status PT Visit Diagnosis: Muscle weakness (generalized) (M62.81);Unsteadiness on feet (R26.81);Difficulty in walking, not elsewhere classified (R26.2)     Time: [id number] PT Time Calculation (min) (ACUTE ONLY): 24 min  Charges:  $Gait Training: 23-37 mins                     Earney Navy, PTA Acute Rehabilitation Services Pager: [phone] Office: [phone]     Darliss Cheney 02/29/2020, 11:29 AM

## 2020-03-01 ENCOUNTER — Encounter (HOSPITAL_COMMUNITY): Payer: Self-pay | Admitting: Physical Medicine & Rehabilitation

## 2020-03-01 ENCOUNTER — Inpatient Hospital Stay (HOSPITAL_COMMUNITY)
Admission: RE | Admit: 2020-03-01 | Discharge: 2020-03-08 | DRG: 057 | Disposition: A | Discharge status: Home Health | Payer: Medicare Other | Source: Intra-hospital | Attending: Physical Medicine & Rehabilitation | Admitting: Physical Medicine & Rehabilitation

## 2020-03-01 ENCOUNTER — Inpatient Hospital Stay (HOSPITAL_COMMUNITY): Admission: RE | Admit: 2020-03-01 | Payer: Medicare Other | Admitting: Physical Medicine & Rehabilitation

## 2020-03-01 ENCOUNTER — Other Ambulatory Visit: Payer: Self-pay

## 2020-03-01 DIAGNOSIS — Z841 Family history of disorders of kidney and ureter: Secondary | ICD-10-CM | POA: Diagnosis not present

## 2020-03-01 DIAGNOSIS — Z66 Do not resuscitate: Secondary | ICD-10-CM | POA: Diagnosis not present

## 2020-03-01 DIAGNOSIS — K2100 Gastro-esophageal reflux disease with esophagitis, without bleeding: Secondary | ICD-10-CM | POA: Diagnosis present

## 2020-03-01 DIAGNOSIS — I272 Pulmonary hypertension, unspecified: Secondary | ICD-10-CM | POA: Diagnosis present

## 2020-03-01 DIAGNOSIS — Z8249 Family history of ischemic heart disease and other diseases of the circulatory system: Secondary | ICD-10-CM | POA: Diagnosis not present

## 2020-03-01 DIAGNOSIS — Z880 Allergy status to penicillin: Secondary | ICD-10-CM

## 2020-03-01 DIAGNOSIS — E119 Type 2 diabetes mellitus without complications: Secondary | ICD-10-CM

## 2020-03-01 DIAGNOSIS — I10 Essential (primary) hypertension: Secondary | ICD-10-CM

## 2020-03-01 DIAGNOSIS — E1122 Type 2 diabetes mellitus with diabetic chronic kidney disease: Secondary | ICD-10-CM | POA: Diagnosis present

## 2020-03-01 DIAGNOSIS — K219 Gastro-esophageal reflux disease without esophagitis: Secondary | ICD-10-CM | POA: Diagnosis present

## 2020-03-01 DIAGNOSIS — E785 Hyperlipidemia, unspecified: Secondary | ICD-10-CM | POA: Diagnosis present

## 2020-03-01 DIAGNOSIS — H9190 Unspecified hearing loss, unspecified ear: Secondary | ICD-10-CM | POA: Diagnosis not present

## 2020-03-01 DIAGNOSIS — F418 Other specified anxiety disorders: Secondary | ICD-10-CM | POA: Diagnosis present

## 2020-03-01 DIAGNOSIS — Z881 Allergy status to other antibiotic agents status: Secondary | ICD-10-CM

## 2020-03-01 DIAGNOSIS — I69354 Hemiplegia and hemiparesis following cerebral infarction affecting left non-dominant side: Secondary | ICD-10-CM | POA: Diagnosis not present

## 2020-03-01 DIAGNOSIS — N182 Chronic kidney disease, stage 2 (mild): Secondary | ICD-10-CM | POA: Diagnosis not present

## 2020-03-01 DIAGNOSIS — H1013 Acute atopic conjunctivitis, bilateral: Secondary | ICD-10-CM

## 2020-03-01 DIAGNOSIS — R109 Unspecified abdominal pain: Secondary | ICD-10-CM | POA: Diagnosis not present

## 2020-03-01 DIAGNOSIS — I428 Other cardiomyopathies: Secondary | ICD-10-CM | POA: Diagnosis not present

## 2020-03-01 DIAGNOSIS — I639 Cerebral infarction, unspecified: Secondary | ICD-10-CM

## 2020-03-01 DIAGNOSIS — G4700 Insomnia, unspecified: Secondary | ICD-10-CM | POA: Diagnosis not present

## 2020-03-01 DIAGNOSIS — I63531 Cerebral infarction due to unspecified occlusion or stenosis of right posterior cerebral artery: Secondary | ICD-10-CM

## 2020-03-01 DIAGNOSIS — I2510 Atherosclerotic heart disease of native coronary artery without angina pectoris: Secondary | ICD-10-CM | POA: Diagnosis present

## 2020-03-01 DIAGNOSIS — Z823 Family history of stroke: Secondary | ICD-10-CM | POA: Diagnosis not present

## 2020-03-01 DIAGNOSIS — I130 Hypertensive heart and chronic kidney disease with heart failure and stage 1 through stage 4 chronic kidney disease, or unspecified chronic kidney disease: Secondary | ICD-10-CM | POA: Diagnosis present

## 2020-03-01 DIAGNOSIS — Z7982 Long term (current) use of aspirin: Secondary | ICD-10-CM

## 2020-03-01 DIAGNOSIS — Z7902 Long term (current) use of antithrombotics/antiplatelets: Secondary | ICD-10-CM

## 2020-03-01 DIAGNOSIS — R42 Dizziness and giddiness: Secondary | ICD-10-CM

## 2020-03-01 DIAGNOSIS — R1312 Dysphagia, oropharyngeal phase: Secondary | ICD-10-CM | POA: Diagnosis present

## 2020-03-01 DIAGNOSIS — Z888 Allergy status to other drugs, medicaments and biological substances status: Secondary | ICD-10-CM

## 2020-03-01 DIAGNOSIS — I5022 Chronic systolic (congestive) heart failure: Secondary | ICD-10-CM

## 2020-03-01 DIAGNOSIS — Z885 Allergy status to narcotic agent status: Secondary | ICD-10-CM

## 2020-03-01 DIAGNOSIS — Z79899 Other long term (current) drug therapy: Secondary | ICD-10-CM

## 2020-03-01 DIAGNOSIS — L309 Dermatitis, unspecified: Secondary | ICD-10-CM | POA: Diagnosis not present

## 2020-03-01 DIAGNOSIS — E1140 Type 2 diabetes mellitus with diabetic neuropathy, unspecified: Secondary | ICD-10-CM | POA: Diagnosis present

## 2020-03-01 LAB — CBC
HCT: 47.2 % (ref 39.0–52.0)
Hemoglobin: 15.3 g/dL (ref 13.0–17.0)
MCH: 29.1 pg (ref 26.0–34.0)
MCHC: 32.4 g/dL (ref 30.0–36.0)
MCV: 89.9 fL (ref 80.0–100.0)
Platelets: 239 10*3/uL (ref 150–400)
RBC: 5.25 MIL/uL (ref 4.22–5.81)
RDW: 13.4 % (ref 11.5–15.5)
WBC: 6.7 10*3/uL (ref 4.0–10.5)
nRBC: 0 % (ref 0.0–0.2)

## 2020-03-01 LAB — CREATININE, SERUM
Creatinine, Ser: 1.13 mg/dL (ref 0.61–1.24)
GFR calc Af Amer: >60 mL/min (ref >60)
GFR calc non Af Amer: >60 mL/min (ref >60)

## 2020-03-01 LAB — GLUCOSE, CAPILLARY
Glucose-Capillary: 111 mg/dL — ABNORMAL HIGH (ref 70–99)
Glucose-Capillary: 111 mg/dL — ABNORMAL HIGH (ref 70–99)
Glucose-Capillary: 124 mg/dL — ABNORMAL HIGH (ref 70–99)
Glucose-Capillary: 111 mg/dL — ABNORMAL HIGH (ref 70–99)

## 2020-03-01 MED ORDER — ROSUVASTATIN CALCIUM 5 MG PO TABS
5.0000 mg | ORAL_TABLET | Freq: Every day | ORAL | Status: DC
  Administered 2020-03-02 – 2020-03-08 (×7): 5 mg via ORAL
  Filled 2020-03-01 (×7): qty 1

## 2020-03-01 MED ORDER — CLOPIDOGREL BISULFATE 75 MG PO TABS: 75.0000 mg | ORAL_TABLET | Freq: Every day | ORAL | 0 refills | Status: DC

## 2020-03-01 MED ORDER — CARVEDILOL 6.25 MG PO TABS
6.2500 mg | ORAL_TABLET | Freq: Two times a day (BID) | ORAL | Status: DC
  Administered 2020-03-02 – 2020-03-03 (×3): 6.25 mg via ORAL
  Filled 2020-03-01 (×3): qty 1

## 2020-03-01 MED ORDER — OLOPATADINE HCL 0.1 % OP SOLN: 1.0000 [drp] | Freq: Two times a day (BID) | OPHTHALMIC | Status: DC | PRN

## 2020-03-01 MED ORDER — ENOXAPARIN SODIUM 40 MG/0.4ML ~~LOC~~ SOLN: 40.0000 mg | SUBCUTANEOUS | Status: DC

## 2020-03-01 MED ORDER — SENNOSIDES-DOCUSATE SODIUM 8.6-50 MG PO TABS: 1.0000 | ORAL_TABLET | Freq: Every evening | ORAL | Status: DC | PRN

## 2020-03-01 MED ORDER — SACUBITRIL-VALSARTAN 24-26 MG PO TABS
1.0000 | ORAL_TABLET | Freq: Two times a day (BID) | ORAL | Status: DC
  Administered 2020-03-01 – 2020-03-08 (×14): 1 via ORAL
  Filled 2020-03-01 (×15): qty 1

## 2020-03-01 MED ORDER — ACETAMINOPHEN 325 MG PO TABS: 650.0000 mg | ORAL_TABLET | ORAL | Status: DC | PRN

## 2020-03-01 MED ORDER — FUROSEMIDE 20 MG PO TABS
20.0000 mg | ORAL_TABLET | ORAL | Status: DC
  Administered 2020-03-02 – 2020-03-07 (×3): 20 mg via ORAL
  Filled 2020-03-01 (×2): qty 1

## 2020-03-01 MED ORDER — ACETAMINOPHEN 650 MG RE SUPP: 650.0000 mg | RECTAL | Status: DC | PRN

## 2020-03-01 MED ORDER — ASPIRIN 81 MG PO CHEW
81.0000 mg | CHEWABLE_TABLET | Freq: Every day | ORAL | Status: DC
  Administered 2020-03-02 – 2020-03-08 (×7): 81 mg via ORAL
  Filled 2020-03-01 (×7): qty 1

## 2020-03-01 MED ORDER — SORBITOL 70 % SOLN: 30.0000 mL | Freq: Every day | Status: DC | PRN

## 2020-03-01 MED ORDER — VITAMIN D 25 MCG (1000 UNIT) PO TABS
1000.0000 [IU] | ORAL_TABLET | Freq: Every day | ORAL | Status: DC
  Administered 2020-03-02 – 2020-03-08 (×7): 1000 [IU] via ORAL
  Filled 2020-03-01 (×7): qty 1

## 2020-03-01 MED ORDER — PANTOPRAZOLE SODIUM 40 MG PO TBEC
40.0000 mg | DELAYED_RELEASE_TABLET | Freq: Every day | ORAL | Status: DC
  Administered 2020-03-02 – 2020-03-08 (×7): 40 mg via ORAL
  Filled 2020-03-01 (×7): qty 1

## 2020-03-01 MED ORDER — CLOPIDOGREL BISULFATE 75 MG PO TABS
75.0000 mg | ORAL_TABLET | Freq: Every day | ORAL | Status: DC
  Administered 2020-03-02 – 2020-03-08 (×7): 75 mg via ORAL
  Filled 2020-03-01 (×7): qty 1

## 2020-03-01 MED ORDER — ENOXAPARIN SODIUM 40 MG/0.4ML ~~LOC~~ SOLN
40.0000 mg | SUBCUTANEOUS | Status: DC
  Administered 2020-03-02 – 2020-03-03 (×2): 40 mg via SUBCUTANEOUS
  Filled 2020-03-01 (×2): qty 0.4

## 2020-03-01 MED ORDER — ACETAMINOPHEN 160 MG/5ML PO SOLN: 650.0000 mg | ORAL | Status: DC | PRN

## 2020-03-01 NOTE — H&P (Signed)
Physical Medicine and Rehabilitation Admission H&P  CC: Right posterior cerebral artery stroke   HPI: Christian [name] is a 79 year old right-handed male with history of chronic systolic congestive heart failure maintained on Entresto, CAD/nonischemic cardiomyopathy maintained on aspirin, hypertension, diabetes mellitus.  Per chart review lives alone.  1 level home 3 steps to entry.  Works for a [employer].  He has an ex-wife who can assist on discharge.  Presented 02/26/2020 with dizziness/lightheaded and increasing numbness bilateral lower extremities.  MRI showed patchy acute infarct in the right PCA territory.  Remote left occipital cortex infarct and remote bilateral superficial occipital hemorrhage.  CT angiogram head and neck negative for emergent large vessel occlusion.  Echocardiogram with ejection fraction of 65% no wall motion abnormalities or emboli identified.  Patient did not receive TPA.  Admission chemistries unremarkable.  A follow-up CT scan was completed 02/27/2020 showing stable right PCA territory infarct with a small petechial hemorrhage within the right parieto-occipital sulci just superior to the infarct bed.  Currently maintained on low-dose aspirin for CVA as well as Plavix prophylaxis x3 months then aspirin alone.  Patient was later placed on Lovenox for DVT prophylaxis 02/29/2020 tolerating a regular diet.  Therapy evaluations completed and patient was admitted for a comprehensive rehab program.  Review of Systems  Cardiovascular: Positive for leg swelling.  Gastrointestinal: Positive for constipation.       GERD  Musculoskeletal: Positive for myalgias.  Neurological: Positive for sensory change.  Psychiatric/Behavioral: Positive for depression. The patient has insomnia.        Anxiety   Past Medical History:  Diagnosis Date  . Allergic rhinitis due to pollen 03/29/2018  . Chronic systolic heart failure (HCC) - EF improved to normal on Rx    Echo 11/18: Mild LVH, EF  20, diffuse HK mild AI, a sending aorta 42 mm (mildly dilated), mild MR, mild TR, trivial PI, PASP 44 // Echo 3/19: mild LVH, EF 25-30, diff HK, Gr 1 DD, asc aorta 43 mm (mildly dilated), MAC, mild reduced RVSF // Limited echo 10/19: Mild concentric LVH, EF 81-44, grade 1 diastolic dysfunction, MAC, mild LAE   . Coronary artery disease involving native coronary artery of native heart without angina pectoris 11/11/2017   R/L HC 11/18: pLAD 45, oD1 65 ; elevated PCWP (mean 29), moderate pulmonary hypertension, CO 3.66, CI 2.07  . Depression with anxiety 07/08/2017  . Essential hypertension 07/08/2017  . GERD with esophagitis 01/11/2018  . Hernia of abdominal cavity   . Hyperlipidemia   . Hyperlipidemia LDL goal <130 05/25/2017   The 10-year ASCVD risk score Mikey Bussing DC Jr., et al., 2013) is: 31.1%   Values used to calculate the score:     Age: 14 years     Sex: Male     Is Non-Hispanic African American: No     Diabetic: No     Tobacco smoker: No     Systolic Blood Pressure: 818 mmHg     Is BP treated: No     HDL Cholesterol: 68.7 mg/dL     Total Cholesterol: 258 mg/dL   . Idiopathic inflammatory myopathy 05/25/2017  . Neuropathy involving both lower extremities 05/25/2017  . Nonischemic cardiomyopathy ([place]) 11/11/2017   LHC 09/16/17-nonobstructive CAD  . Oropharyngeal dysphagia 01/11/2018  . Type 2 diabetes mellitus with complication, without long-term current use of insulin ([place]) 07/08/2017   Past Surgical History:  Procedure Laterality Date  . BACK SURGERY    . HERNIA REPAIR    .  RIGHT/LEFT HEART CATH AND CORONARY ANGIOGRAPHY N/A 09/16/2017   Procedure: RIGHT/LEFT HEART CATH AND CORONARY ANGIOGRAPHY;  Surgeon: Martinique, Peter M, MD;  Location: [facility];  Service: Cardiovascular;  Laterality: N/A;  . SPINE SURGERY     Family History  Problem Relation Age of Onset  . Stroke Mother   . Kidney disease Father   . Stroke Father   . Hypertension Father   . Early death Father   . Cancer Neg Hx   .  Alcohol abuse Neg Hx   . Diabetes Neg Hx   . Heart disease Neg Hx   . Hyperlipidemia Neg Hx    Social History:  reports that he has never smoked. He has never used smokeless tobacco. He reports that he does not drink alcohol or use drugs. Allergies:  Allergies  Allergen Reactions  . Amoxicillin Hives  . Bactrim [Sulfamethoxazole-Trimethoprim] Other (See Comments)    Dried out his eyes  . Cephalexin Itching  . Hydrocodone Itching  . Lexapro [Escitalopram Oxalate] Other (See Comments)    disorientation  . Lipitor [Atorvastatin] Other (See Comments)    Damage to calf muscles  . Oxycodone Itching  . Statins Other (See Comments)    Muscle pain   . Vytorin [Ezetimibe-Simvastatin] Other (See Comments)    Damage to calf muscles   Medications Prior to Admission  Medication Sig Dispense Refill  . aspirin EC 81 MG tablet Take 1 tablet (81 mg total) by mouth daily. 90 tablet 3  . carvedilol (COREG) 6.25 MG tablet TAKE 1 AND 1/2 TABLETS BY MOUTH TWICE DAILY (Patient taking differently: Take 6.25 mg by mouth in the morning and at bedtime. ) 90 tablet 6  . cholecalciferol (VITAMIN D) 1000 units tablet Take 1,000 Units by mouth daily.    . clopidogrel (PLAVIX) 75 MG tablet Take 1 tablet (75 mg total) by mouth daily. 30 tablet 0  . esomeprazole (NEXIUM) 40 MG capsule Take 1 capsule (40 mg total) by mouth as needed (for heartburn). 30 capsule 5  . olopatadine (PATANOL) 0.1 % ophthalmic solution Place 1 drop into both eyes as needed for allergies. 5 mL 2  . rosuvastatin (CRESTOR) 5 MG tablet TAKE 1 TABLET BY MOUTH DAILY (Patient taking differently: Take 5 mg by mouth daily. ) 90 tablet 3  . sacubitril-valsartan (ENTRESTO) 24-26 MG Take 1 tablet by mouth 2 (two) times daily. 180 tablet 3    Drug Regimen Review Drug regimen was reviewed and remains appropriate with no significant issues identified  Home: Home Living Family/patient expects to be discharged to:: Private residence Living  Arrangements: Alone Available Help at Discharge: Family, Available PRN/intermittently(reports ex wife, Christian [name] or Christian [name] (g/f) can help him at home) Type of Home: House Home Access: Stairs to enter [employer] of Steps: 3 Entrance Stairs-Rails: Right Home Layout: One level Bathroom Shower/Tub: Multimedia programmer: Standard Home Equipment: None, Shower seat - built in   Functional History: Prior Function Level of Independence: Independent Comments: Lives with 2 dogs [place] and [place]. Does his own ADLs/IADLs, Working for a [employer]. Drives.  Functional Status:  Mobility: Bed Mobility Overal bed mobility: Needs Assistance Bed Mobility: Supine to Sit Supine to sit: Min guard, HOB elevated General bed mobility comments: + rail use, increased time; dizziness- resolves. VSS; negative for orthostatic BP sitting to standing Transfers Overall transfer level: Needs assistance Equipment used: None Transfers: Sit to/from Stand Sit to Stand: Mod assist, Min assist Stand pivot transfers: Min assist, +2 physical assistance  General transfer comment: increased assist needed upon standin due to impaired balance; cues for safety as pt attempting to stand several times from EOB despite cues to wait for therapist Ambulation/Gait Ambulation/Gait assistance: +2 safety/equipment, Mod assist(chair follow ) Gait Distance (Feet): 50 Feet Assistive device: 1 person hand held assist(assist at trunk with gait belt) Gait Pattern/deviations: Decreased step length - left, Decreased step length - right, Staggering left, Staggering right, Leaning posteriorly General Gait Details: assistance required for balance and weight shifting; cues for increased bilat step lengths and decreased speed; pt moving very quickly and with lateral and posterior LOB  Gait velocity: decreased Gait velocity interpretation: <1.8 ft/sec, indicate of risk for recurrent falls  ADL: ADL Overall ADL's : Needs  assistance/impaired Eating/Feeding: Modified independent, Sitting Grooming: Set up, Sitting Upper Body Bathing: Minimal assistance, Sitting Lower Body Bathing: Maximal assistance, Sitting/lateral leans, Sit to/from stand Upper Body Dressing : Minimal assistance, Sitting Lower Body Dressing: Maximal assistance, Sitting/lateral leans, Sit to/from stand Toilet Transfer: Minimal assistance, Stand-pivot, BSC, RW, +2 for physical assistance Toileting- Clothing Manipulation and Hygiene: Moderate assistance, +2 for physical assistance, Sitting/lateral lean, Sit to/from stand, Cueing for sequencing, Cueing for safety Functional mobility during ADLs: Moderate assistance, +2 for physical assistance, Rolling walker, Cueing for safety General ADL Comments: Pt limited by [facility] status, decreased strength, decreased activity tolerance and decreased ability to care for self.  Cognition: Cognition Overall Cognitive Status: Impaired/Different from baseline Orientation Level: Oriented X4 Cognition Arousal/Alertness: Awake/alert Behavior During Therapy: WFL for tasks assessed/performed, Impulsive Overall Cognitive Status: Impaired/Different from baseline Area of Impairment: Problem solving, Awareness, Safety/judgement Safety/Judgement: Decreased awareness of safety, Decreased awareness of deficits Awareness: Intellectual Problem Solving: Requires verbal cues General Comments: HOH. Poor awareness of deficits/safety.   Physical Exam: Blood pressure 126/76, pulse 74, temperature 97.7 F (36.5 C), temperature source Oral, resp. rate 16, SpO2 96 %. Gen: no distress, normal appearing, sitting up in chair comfortably.  HEENT: oral mucosa pink and moist, NCAT, hard of hearing. Left homonymous hemianopsia. Cardio: Reg rate Chest: normal effort, normal rate of breathing Abd: soft, non-distended Ext: no edema Skin: intact Neuro/Musculoskeletal: Patient is alert and in no acute distress.  Follows commands.   Spells WORLD backward slowly but accurately. Provides his name and age.  He does exhibit some decreased awareness of deficits. 4+/5 strength throughout Psych: pleasant, normal affect  Results for orders placed or performed during the hospital encounter of 03/01/20 (from the past 48 hour(s))  CBC     Status: None   Collection Time: 03/01/20  6:00 PM  Result Value Ref Range   WBC 6.7 4.0 - 10.5 K/uL   RBC 5.25 4.22 - 5.81 MIL/uL   Hemoglobin 15.3 13.0 - 17.0 g/dL   HCT 47.2 39.0 - 52.0 %   MCV 89.9 80.0 - 100.0 fL   MCH 29.1 26.0 - 34.0 pg   MCHC 32.4 30.0 - 36.0 g/dL   RDW 13.4 11.5 - 15.5 %   Platelets 239 150 - 400 K/uL   nRBC 0.0 0.0 - 0.2 %    Comment: Performed at [facility], [place] [address] [place], [place] [address]  Creatinine, serum     Status: None   Collection Time: 03/01/20  6:00 PM  Result Value Ref Range   Creatinine, Ser 1.13 0.61 - 1.24 mg/dL   GFR calc non Af Amer >60 >60 mL/min   GFR calc Af Amer >60 >60 mL/min    Comment: Performed at [facility]  [address] [place], [place] [address]   No results found.     Medical Problem List and Plan: 1.  Mild left hemiparesis with sensory deficits and dizziness secondary to right PCA acute infarct with severe stenosis of proximal mid right P2 segment, with markedly attenuated flow within the right PCA distally.  Moderate to severe proximal left P2 stenosis  -patient may shower  -ELOS/Goals: modI 12-14 days  -Admit to CIR 2.  Antithrombotics: -DVT/anticoagulation: Lovenox initiated 02/29/2020  -antiplatelet therapy: Aspirin 81 mg daily and Plavix 75 mg daily x3 months then aspirin alone 3. Pain Management: Tylenol as needed. Well controlled.  4. Mood: Provide emotional support  -antipsychotic agents: N/A 5. Neuropsych: This patient is capable of making decisions on his own behalf. 6. Skin/Wound Care: Routine skin checks 7. Fluids/Electrolytes/Nutrition: Routine in and outs with follow-up  chemistries 8.  Chronic systolic congestive heart failure.  Lasix 20 mg 3 times weekly Monday Wednesday Friday, Entresto 24-26 mg twice daily.  Monitor for any signs of fluid overload 9.  Hypertension.  Coreg 6.25 mg twice daily.  Monitor with increased mobility. Well controlled.  10.  CAD/nonischemic cardiomyopathy.  Continue Coreg 11.  Diet-controlled diabetes mellitus.  Latest hemoglobin A1c 6.4.  Latest blood sugars 82-82-92.  CBGs discontinued 12.  Hyperlipidemia.  Crestor 13m. Do not increase dose given side effects of myalgias.  13.  GERD.  Nexium  DLavon PaganiniAngiulli, PA-C 02/28/2020   I have personally performed a face to face diagnostic evaluation, including, but not limited to relevant history and physical exam findings, of this patient and developed relevant assessment and plan.  Additionally, I have reviewed and concur with the physician assistant's documentation above.  The patient's status has not changed. The original post admission physician evaluation remains appropriate, and any changes from the pre-admission screening or documentation from the acute chart are noted above.   KLeeroy Cha MD

## 2020-03-01 NOTE — Progress Notes (Signed)
Nurse Benjie Karvonen called to receive report.  Will be going to room 4W 12 upon discharge.  Waiting for room to be cleaned and prepared.  Nurse will call when ready.

## 2020-03-01 NOTE — Progress Notes (Signed)
Discharged to 4th floor rehab.  Transported in chair accompanied by 2 techs.

## 2020-03-01 NOTE — TOC Transition Note (Signed)
Transition of Care [facility]) - CM/SW Discharge Note   Patient Details  Name: Cedrie Skaggs MRN: [id number] Date of Birth: [birth year]  [facility] CM/SW Contact:  Pollie Friar, RN Phone Number: 03/01/2020, 10:46 AM   Clinical Narrative:    Pt is discharging to CIR today. CM signing off.    Final next level of care: IP Rehab Facility Barriers to Discharge: No Barriers Identified   Patient Goals and CMS Choice        Discharge Placement                       Discharge Plan and Services                                     Social Determinants of Health (SDOH) Interventions     Readmission Risk Interventions No flowsheet data found.

## 2020-03-01 NOTE — Progress Notes (Signed)
Inpatient Rehabilitation Admissions Coordinator  I have insurance approval and bed available to admit patient to CIR today. I met with patient at bedside and he is in agreement. I I have contacted attending service as well as TOC team. I will make the arrangements to admit today.  Danne Baxter, RN, MSN Rehab Admissions Coordinator [phone] 03/01/2020 10:28 AM

## 2020-03-01 NOTE — Care Management Important Message (Signed)
Important Message  Patient Details  Name: Christian [name] MRN: [id number] Date of Birth: 1941-08-02   Medicare Important Message Given:  Yes     Orbie Pyo 03/01/2020, 3:27 PM

## 2020-03-01 NOTE — Progress Notes (Signed)
Cristina Gong, RN  Rehab Admission Coordinator  Physical Medicine and Rehabilitation  PMR Pre-admission     Signed  Date of Service:  02/28/2020  1:29 PM      Related encounter: ED to Hosp-Admission (Current) from 02/25/2020 in [facility]      Signed        Show:Clear all _0 Manual_1 Template_2 Copied  Added by: _3 Cristina Gong, RN  _4 Hover for details PMR Admission Coordinator Pre-Admission Assessment   Patient: Christian [name] is an 79 y.o., male MRN: [id number] DOB: [birth year] Height: _5  (167.6 cm) Weight: 74.8 kg                                                                                                                                                  Insurance Information HMO: yes PRIMARY: [employer]      Policy#: [id number]      Subscriber: pt CM Name: Wilburn Cornelia      Phone#: [phone] option #3     Fax#: [phone] Pre-Cert#: [id number] approved for 7 days      Employer:  Benefits:  Phone #: [phone]     Name: 5/10 Eff. Date: 10/21/2019     Deduct: none      Out of Pocket Max: $3600      Life Max: none  CIR: $295 co pay per day days 1 until 5      SNF: no copay days 1 until 20; $184 co pay per day days 21 until 40; no copay days 41 until 100 Outpatient: $30 per visit     Co-Pay: visits per medical neccesity Home Health: 100%      Co-Pay: visits per medical neccesity DME: 80%     Co-Pay: 20% Providers: in network  SECONDARY: none         Financial Counselor:       Phone#:    The Engineer, petroleum" for patients in Inpatient Rehabilitation Facilities with attached "Privacy Act [place] Records" was provided and verbally reviewed with: Patient   Emergency Contact Information Contact Information     Name Relation Home Work [place], [place]    Moshe Salisbury Friend [phone]        Alda Lea Other [phone]           Current Medical  History  Patient Admitting Diagnosis: Right PCA acute infarct   History of Present Illness: 79 year old right-handed male with history of chronic systolic congestive heart failure maintained on Entresto, CAD/nonischemic cardiomyopathy maintained on aspirin, hypertension, diabetes mellitus.   Presented 02/26/2020 with dizziness/lightheaded and increasing numbness bilateral lower extremities.  MRI showed patchy acute infarct in the right PCA territory.  Remote left occipital cortex infarct and remote bilateral superficial occipital hemorrhage.  CT angiogram head and neck negative  for emergent large vessel occlusion.  Echocardiogram with ejection fraction of 65% no wall motion abnormalities or emboli identified.  Patient did not receive TPA.  Admission chemistries unremarkable.  A follow-up CT scan was completed 02/27/2020 showing stable right PCA territory infarct with a small petechial hemorrhage within the right parieto-occipital sulci just superior to the infarct bed.  Currently maintained on low-dose aspirin for CVA as well as Plavix prophylaxis x3 months then aspirin alone. Patient placed on Lovenox for DVT prophylaxis. Tolerating a regular diet.     Complete NIHSS TOTAL: 4 Glasgow Coma Scale Score: 15   Past Medical History      Past Medical History:  Diagnosis Date  . Allergic rhinitis due to pollen 03/29/2018  . Chronic systolic heart failure (HCC) - EF improved to normal on Rx      Echo 11/18: Mild LVH, EF 20, diffuse HK mild AI, a sending aorta 42 mm (mildly dilated), mild MR, mild TR, trivial PI, PASP 44 // Echo 3/19: mild LVH, EF 25-30, diff HK, Gr 1 DD, asc aorta 43 mm (mildly dilated), MAC, mild reduced RVSF // Limited echo 10/19: Mild concentric LVH, EF 39-76, grade 1 diastolic dysfunction, MAC, mild LAE   . Coronary artery disease involving native coronary artery of native heart without angina pectoris 11/11/2017    R/L HC 11/18: pLAD 45, oD1 65 ; elevated PCWP (mean 29), moderate pulmonary  hypertension, CO 3.66, CI 2.07  . Depression with anxiety 07/08/2017  . Essential hypertension 07/08/2017  . GERD with esophagitis 01/11/2018  . Hernia of abdominal cavity    . Hyperlipidemia    . Hyperlipidemia LDL goal <130 05/25/2017    The 10-year ASCVD risk score Mikey Bussing DC Jr., et al., 2013) is: 31.1%   Values used to calculate the score:     Age: 81 years     Sex: Male     Is Non-Hispanic African American: No     Diabetic: No     Tobacco smoker: No     Systolic Blood Pressure: 734 mmHg     Is BP treated: No     HDL Cholesterol: 68.7 mg/dL     Total Cholesterol: 258 mg/dL   . Idiopathic inflammatory myopathy 05/25/2017  . Neuropathy involving both lower extremities 05/25/2017  . Nonischemic cardiomyopathy ([place]) 11/11/2017    LHC 09/16/17-nonobstructive CAD  . Oropharyngeal dysphagia 01/11/2018  . Type 2 diabetes mellitus with complication, without long-term current use of insulin ([place]) 07/08/2017      Family History  family history includes Early death in his father; Hypertension in his father; Kidney disease in his father; Stroke in his father and mother.   Prior Rehab/Hospitalizations:  Has the patient had prior rehab or hospitalizations prior to admission? Yes   Has the patient had major surgery during 100 days prior to admission? No   Current Medications    Current Facility-Administered Medications:  .  acetaminophen (TYLENOL) tablet 650 mg, 650 mg, Oral, Q4H PRN **OR** acetaminophen (TYLENOL) 160 MG/5ML solution 650 mg, 650 mg, Per Tube, Q4H PRN **OR** acetaminophen (TYLENOL) suppository 650 mg, 650 mg, Rectal, Q4H PRN, Bonnita Hollow, MD .  alum & mag hydroxide-simeth (MAALOX/MYLANTA) 200-200-20 MG/5ML suspension 30 mL, 30 mL, Oral, Q4H PRN, Bonnita Hollow, MD, 30 mL at 02/26/20 0104 .  aspirin chewable tablet 81 mg, 81 mg, Oral, Daily, Amie Portland, MD, 81 mg at 03/01/20 0906 .  carvedilol (COREG) tablet 6.25 mg, 6.25 mg, Oral, BID WC, Lattie Haw, MD, 6.25  mg at 03/01/20  0907 .  cholecalciferol (VITAMIN D3) tablet 1,000 Units, 1,000 Units, Oral, Daily, Bonnita Hollow, MD, 1,000 Units at 03/01/20 [phone] .  clopidogrel (PLAVIX) tablet 75 mg, 75 mg, Oral, Daily, Leeanne Rio, MD, 75 mg at 03/01/20 0907 .  enoxaparin (LOVENOX) injection 40 mg, 40 mg, Subcutaneous, Q24H, Ko, Christine, RPH, 40 mg at 02/29/20 1635 .  esomeprazole (NEXIUM) capsule 40 mg, 40 mg, Oral, QAC breakfast, Wilber Oliphant, MD, 40 mg at 03/01/20 0907 .  furosemide (LASIX) tablet 20 mg, 20 mg, Oral, Once per day on Mon Wed Fri, Josephine Igo B, MD, 20 mg at 02/29/20 1006 .  olopatadine (PATANOL) 0.1 % ophthalmic solution 1 drop, 1 drop, Both Eyes, BID PRN, Bonnita Hollow, MD .  rosuvastatin (CRESTOR) tablet 5 mg, 5 mg, Oral, Daily, Bonnita Hollow, MD, 5 mg at 03/01/20 0907 .  sacubitril-valsartan (ENTRESTO) 24-26 mg per tablet, 1 tablet, Oral, BID, Bonnita Hollow, MD, 1 tablet at 03/01/20 [phone] .  senna-docusate (Senokot-S) tablet 1 tablet, 1 tablet, Oral, QHS PRN, Bonnita Hollow, MD   Patients Current Diet:     Diet Order                      Diet Carb Modified Fluid consistency: Thin; Room service appropriate? 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 Does his own ADLs/IADLs, Working for a [employer]. Drives.   Self Care: Did the patient need help bathing, dressing, using the toilet or eating?  Independent   Indoor Mobility: Did the patient need assistance with walking from room to room (with or without device)?  Independent   Stairs: Did the patient need assistance with internal or external stairs (with or without device)? Independent   Functional Cognition: Did the patient need help planning regular tasks such as shopping or remembering to take medications? Independent   Home Assistive Devices / Equipment Home Equipment: None, Shower seat - built in   Prior Device Use: Indicate devices/aids used by the patient prior to current illness, exacerbation or injury? None of the above   Current Functional Level Cognition   Arousal/Alertness: Awake/alert Overall Cognitive Status: Impaired/Different from baseline Current Attention Level: Selective Orientation Level: Oriented to person, Oriented to place, Disoriented to time, Oriented to situation Following Commands: Follows multi-step commands inconsistently Safety/Judgement: Decreased awareness of safety, Decreased awareness of deficits General Comments: Pt reading phone number as only 4 digits. pt asked how many digits are in phone number. pt states 7 digits. pt requires mod cues to scan L to read entire phoen number. pt motivated to read x2 caregiver phone numbers in hand. pt attempting initially to read upside down and OT had to cue to correctly position paper Attention: Focused, Sustained Focused Attention: Appears intact Sustained Attention: Appears intact Memory: Impaired Memory Impairment: Retrieval deficit, Storage deficit, Decreased recall of new information(Immediate: 5/5 with repetition; delayed: 2/5; with cues 2/3) Awareness: Impaired Awareness Impairment: Emergent impairment Problem Solving: Impaired Problem  Solving Impairment: Verbal complex Executive Function: Reasoning, Sequencing Reasoning: Impaired Reasoning Impairment: Verbal complex    Extremity Assessment (includes Sensation/Coordination)   Upper Extremity Assessment: Generalized weakness, RUE deficits/detail, LUE deficits/detail RUE Deficits / Details: AROM, WFLs; strength  is 3+/5 MM grade LUE Deficits / Details: AROM, WFLs; strength is 3+/5 MM grade  Lower Extremity Assessment: Defer to PT evaluation, Generalized weakness RLE Deficits / Details: Decreased sensation from knees down to foot-premorbid, otherwise strength 4/5 RLE Sensation: decreased light touch LLE Deficits / Details: Decreased sensation from knees down to foot- premorbid; otherwise strength 4/5. LLE Sensation: decreased light touch     ADLs   Overall ADL's : Needs assistance/impaired Eating/Feeding: Modified independent, Sitting Grooming: Minimal assistance, Sitting Upper Body Bathing: Minimal assistance, Standing Lower Body Bathing: Minimal assistance, Sit to/from stand Upper Body Dressing : Minimal assistance, Sitting Lower Body Dressing: Maximal assistance Lower Body Dressing Details (indicate cue type and reason): requires (A) to thread underwear on Toilet Transfer: +2 for physical assistance, Moderate assistance Toileting- Clothing Manipulation and Hygiene: Moderate assistance, +2 for physical assistance, Sitting/lateral lean, Sit to/from stand, Cueing for sequencing, Cueing for safety Functional mobility during ADLs: +2 for physical assistance, Moderate assistance General ADL Comments: Pt incontinence on arrival. pt asking for help and states somethign is wet. pt unaware of urine on underwear. pt completed sink level bath with (A) and cues for sequence. pt fixated on being indep with task and expressed prior prolonged hospitalizations. pt with L decreased attention      Mobility   Overal bed mobility: Needs Assistance Bed Mobility: Supine to Sit Supine to sit: Min assist General bed mobility comments: multimodal cues for sequencing going toward L side and increased time for pt to find L rail to assist in rolling; assist to elevate trunk into sitting     Transfers   Overall transfer level: Needs assistance Equipment used: 2 person hand held assist Transfers: Sit to/from Stand Sit to  Stand: +2 physical assistance, Mod assist Stand pivot transfers: Min assist, +2 physical assistance General transfer comment: assist to power up into standing and for balance      Ambulation / Gait / Stairs / Wheelchair Mobility   Ambulation/Gait Ambulation/Gait assistance: Mod assist, +2 physical assistance, +2 safety/equipment Gait Distance (Feet): 75 Feet Assistive device: 2 person hand held assist(assist at trunk with gait belt) Gait Pattern/deviations: Step-to pattern, Decreased step length - left, Step-through pattern, Decreased step length - right General Gait Details: anterior lean at times; cues for gait speed and bilat step lengths; improving step length symmetry noted today; shuffling gait when turning/directional changes Gait velocity: decreased Gait velocity interpretation: <1.8 ft/sec, indicate of risk for recurrent falls     Posture / Balance Balance Overall balance assessment: History of Falls Sitting-balance support: Feet supported Sitting balance-Leahy Scale: Fair Standing balance support: During functional activity, Bilateral upper extremity supported, Single extremity supported Standing balance-Leahy Scale: Poor Standing balance comment: pt continues to reach to R side with L UE to hold onto something if standing/ambulating with single UE only     Special needs/care consideration Diabetic management Hgb A1c 6.4 Visitors are [place] and Journalist, newspaper. Verneita Griffes is exwife and [name] is a friend      Previous Geologist, engineering: Alone  Lives With: Alone Available Help at Discharge: Family, Available PRN/intermittently Type of Home: House Home Layout: One level Home Access: Stairs to enter Entrance Stairs-Rails: Right Entrance Stairs-Number of Steps: 3 Bathroom Shower/Tub: Multimedia programmer: Standard Bathroom Accessibility: Yes  How Accessible: Accessible via walker Home Care Services: No   Discharge Living Setting Plans for Discharge  Living Setting: Patient's home, Alone Type of Home at Discharge: House Discharge Home Layout: One level Discharge Home Access: Stairs to enter Entrance Stairs-Rails: Right Entrance Stairs-Number of Steps: 3 Discharge Bathroom Shower/Tub: Walk-in shower Discharge Bathroom Toilet: Standard Discharge Bathroom Accessibility: Yes How Accessible: Accessible via walker Does the patient have any problems obtaining your medications?: No   Social/Family/Support Systems Patient Roles: Mudlogger) Contact Information: Verneita Griffes, exwife Anticipated Caregiver: exwife and friends Anticipated Caregiver's Contact Information: [phone] Ability/Limitations of Caregiver: intermittent assistant longterm Caregiver Availability: Intermittent Discharge Plan Discussed with Primary Caregiver: Yes Is Caregiver In Agreement with Plan?: Yes Does Caregiver/Family have Issues with Lodging/Transportation while Pt is in Rehab?: No   Goals Patient/Family Goal for Rehab: Mod I with PT, OT and SLP Expected length of stay: ELOS 12 to 14 days Pt/Family Agrees to Admission and willing to participate: Yes Program Orientation Provided & Reviewed with Pt/Caregiver Including Roles  & Responsibilities: Yes   Decrease burden of Care through IP rehab admission: n/a   Possible need for SNF placement upon discharge:not anticipated     Patient Condition: This patient's medical and functional status has changed since the consult dated: 02/27/2020 in which the Rehabilitation Physician determined and documented that the patient's condition is appropriate for intensive rehabilitative care in an inpatient rehabilitation facility. See "History of Present Illness" (above) for medical update. Functional changes are: mod assist overall. Patient's medical and functional status update has been discussed with the Rehabilitation physician and patient remains appropriate for inpatient rehabilitation. Will admit to inpatient  rehab today.   Preadmission Screen Completed By:  Cleatrice Burke, RN, 03/01/2020 10:36 AM ______________________________________________________________________   Discussed status with Dr. Ranell Patrick on 03/01/2020 at  1035 and received approval for admission today.   Admission Coordinator:  Cleatrice Burke, time 4765 Date 03/01/2020             Cosigned by: Izora Ribas, MD at 03/01/2020 10:38 AM  Revision History

## 2020-03-01 NOTE — Progress Notes (Signed)
Christian Ribas, MD  Physician  Physical Medicine and Rehabilitation  Consult Note     Signed  Date of Service:  02/27/2020  7:50 AM      Related encounter: ED to Hosp-Admission (Current) from 02/25/2020 in [place] [facility]      Signed      Expand AllCollapse All   Show:Clear all _0 Manual_1 Template_2 Copied  Added by: _3 Cathlyn Parsons, PA-C_4 Ranell Patrick Clide Deutscher, MD  _5 Hover for details          Physical Medicine and Rehabilitation Consult Reason for Consult: Decreased functional mobility with dizziness Referring Physician: Internal medicine     HPI: Christian [name] is a 79 y.o. right-handed male with history of chronic systolic congestive heart failure, CAD/nonischemic cardiomyopathy maintained on aspirin, hypertension, diabetes mellitus.  Per chart review patient lives alone.  1 level home 3 steps to entry.  Works for a [employer].  He has an ex-wife as well as a girlfriend who he says can assist.  Presented 02/26/2020 with dizziness/lightheaded and increasing numbness bilateral lower extremities.  MRI showed patchy acute infarct in the right PCA territory.  Remote left occipital cortex infarct and remote bilateral superficial occipital hemorrhage.  CT angiogram head and neck negative for emergent large vessel occlusion.  Echocardiogram with ejection fraction of 65% no wall motion abnormalities.  Patient did not receive TPA.  Admission chemistries unremarkable.  A follow-up CT scan was completed 02/27/2020 showing stable right PCA territory infarct and small petechial hemorrhage within the right parieto-occipital sulci just superior to the infarct bed.  Currently maintained on low-dose aspirin for CVA prophylaxis.  Tolerating regular diet.  Therapy evaluations completed with recommendations of physical medicine rehab consult.     Review of Systems  Constitutional: Negative for chills and fever.  HENT: Negative for hearing loss.   Eyes: Negative for blurred  vision and double vision.  Respiratory: Negative for cough and shortness of breath.   Cardiovascular: Positive for leg swelling. Negative for chest pain and palpitations.  Gastrointestinal: Positive for constipation. Negative for heartburn, nausea and vomiting.       GERD  Genitourinary: Negative for dysuria, flank pain and hematuria.  Musculoskeletal: Positive for myalgias.  Skin: Negative for rash.  Neurological: Positive for sensory change.  Psychiatric/Behavioral: Positive for depression. The patient has insomnia.        Anxiety  All other systems reviewed and are negative.       Past Medical History:  Diagnosis Date  . Allergic rhinitis due to pollen 03/29/2018  . Chronic systolic heart failure (HCC) - EF improved to normal on Rx      Echo 11/18: Mild LVH, EF 20, diffuse HK mild AI, a sending aorta 42 mm (mildly dilated), mild MR, mild TR, trivial PI, PASP 44 // Echo 3/19: mild LVH, EF 25-30, diff HK, Gr 1 DD, asc aorta 43 mm (mildly dilated), MAC, mild reduced RVSF // Limited echo 10/19: Mild concentric LVH, EF 12-45, grade 1 diastolic dysfunction, MAC, mild LAE   . Coronary artery disease involving native coronary artery of native heart without angina pectoris 11/11/2017    R/L HC 11/18: pLAD 45, oD1 65 ; elevated PCWP (mean 29), moderate pulmonary hypertension, CO 3.66, CI 2.07  . Depression with anxiety 07/08/2017  . Essential hypertension 07/08/2017  . GERD with esophagitis 01/11/2018  . Hernia of abdominal cavity    . Hyperlipidemia    . Hyperlipidemia LDL goal <130 05/25/2017    The 10-year ASCVD risk score Mikey Bussing DC  Brooke Bonito., et al., 2013) is: 31.1%   Values used to calculate the score:     Age: 41 years     Sex: Male     Is Non-Hispanic African American: No     Diabetic: No     Tobacco smoker: No     Systolic Blood Pressure: 024 mmHg     Is BP treated: No     HDL Cholesterol: 68.7 mg/dL     Total Cholesterol: 258 mg/dL   . Idiopathic inflammatory myopathy 05/25/2017  . Neuropathy  involving both lower extremities 05/25/2017  . Nonischemic cardiomyopathy ([place]) 11/11/2017    LHC 09/16/17-nonobstructive CAD  . Oropharyngeal dysphagia 01/11/2018  . Type 2 diabetes mellitus with complication, without long-term current use of insulin ([place]) 07/08/2017         Past Surgical History:  Procedure Laterality Date  . BACK SURGERY      . HERNIA REPAIR      . RIGHT/LEFT HEART CATH AND CORONARY ANGIOGRAPHY N/A 09/16/2017    Procedure: RIGHT/LEFT HEART CATH AND CORONARY ANGIOGRAPHY;  Surgeon: Martinique, Peter M, MD;  Location: [facility];  Service: Cardiovascular;  Laterality: N/A;  . SPINE SURGERY             Family History  Problem Relation Age of Onset  . Stroke Mother    . Kidney disease Father    . Stroke Father    . Hypertension Father    . Early death Father    . Cancer Neg Hx    . Alcohol abuse Neg Hx    . Diabetes Neg Hx    . Heart disease Neg Hx    . Hyperlipidemia Neg Hx      Social History:  reports that he has never smoked. He has never used smokeless tobacco. He reports that he does not drink alcohol or use drugs. Allergies:       Allergies  Allergen Reactions  . Amoxicillin Hives  . Bactrim [Sulfamethoxazole-Trimethoprim] Other (See Comments)      Dried out his eyes  . Cephalexin Itching  . Hydrocodone Itching  . Lexapro [Escitalopram Oxalate] Other (See Comments)      disorientation  . Lipitor [Atorvastatin] Other (See Comments)      Damage to calf muscles  . Oxycodone Itching  . Statins Other (See Comments)      Muscle pain    . Vytorin [Ezetimibe-Simvastatin] Other (See Comments)      Damage to calf muscles          Medications Prior to Admission  Medication Sig Dispense Refill  . aspirin EC 81 MG tablet Take 1 tablet (81 mg total) by mouth daily. 90 tablet 3  . carvedilol (COREG) 6.25 MG tablet TAKE 1 AND 1/2 TABLETS BY MOUTH TWICE DAILY (Patient taking differently: Take 6.25 mg by mouth in the morning and at bedtime. ) 90 tablet 6  .  cholecalciferol (VITAMIN D) 1000 units tablet Take 1,000 Units by mouth daily.      Marland Kitchen esomeprazole (NEXIUM) 40 MG capsule Take 1 capsule (40 mg total) by mouth as needed (for heartburn). 30 capsule 5  . olopatadine (PATANOL) 0.1 % ophthalmic solution Place 1 drop into both eyes as needed for allergies. 5 mL 2  . rosuvastatin (CRESTOR) 5 MG tablet TAKE 1 TABLET BY MOUTH DAILY (Patient taking differently: Take 5 mg by mouth daily. ) 90 tablet 3  . sacubitril-valsartan (ENTRESTO) 24-26 MG Take 1 tablet by mouth 2 (two) times daily.  180 tablet 3  . furosemide (LASIX) 40 MG tablet Take 0.5 tablets (20 mg total) by mouth 3 (three) times a week. (Patient not taking: Reported on 10/12/2019) 30 tablet 6      Home: [place] expects to be discharged to:: Private residence Living Arrangements: Alone Available Help at Discharge: Family, Available PRN/intermittently(reports ex wife, [name] or [name] (g/f) can help him at home) Type of Home: House Home Access: Stairs to enter [employer] of Steps: 3 Entrance Stairs-Rails: Right Home Layout: One level Bathroom Shower/Tub: Multimedia programmer: Trainer: None, Shower seat - built in  Functional History: Prior Function Level of Independence: Independent Comments: Lives with 2 dogs [place] and [place]. Does his own ADLs/IADLs, Working for a [employer]. Drives. Functional Status:  Mobility: Bed Mobility Overal bed mobility: Needs Assistance Bed Mobility: Supine to Sit Supine to sit: Min guard, HOB elevated General bed mobility comments: + rail use, increased time; dizziness- resolves. VSS Transfers Overall transfer level: Needs assistance Equipment used: 2 person hand held assist Transfers: Sit to/from Stand, Stand Pivot Transfers Sit to Stand: Mod assist, +2 physical assistance Stand pivot transfers: Min assist, +2 physical assistance General transfer comment: x1 sit to stand with immediate  need to sit down, then modA +2 for power up; minA +2 for stand pivot. Ambulation/Gait Ambulation/Gait assistance: Min assist, +2 safety/equipment Gait Distance (Feet): 12 Feet Assistive device: 1 person hand held assist Gait Pattern/deviations: Shuffle, Decreased step length - left, Decreased step length - right, Wide base of support General Gait Details: Short, shuffling steps with increased knee flexion bilaterally. Gait velocity: decreased Gait velocity interpretation: <1.8 ft/sec, indicate of risk for recurrent falls   ADL: ADL Overall ADL's : Needs assistance/impaired Eating/Feeding: Modified independent, Sitting Grooming: Set up, Sitting Upper Body Bathing: Minimal assistance, Sitting Lower Body Bathing: Maximal assistance, Sitting/lateral leans, Sit to/from stand Upper Body Dressing : Minimal assistance, Sitting Lower Body Dressing: Maximal assistance, Sitting/lateral leans, Sit to/from stand Toilet Transfer: Minimal assistance, Stand-pivot, BSC, RW, +2 for physical assistance Toileting- Clothing Manipulation and Hygiene: Moderate assistance, +2 for physical assistance, Sitting/lateral lean, Sit to/from stand, Cueing for sequencing, Cueing for safety Functional mobility during ADLs: Moderate assistance, +2 for physical assistance, Rolling walker, Cueing for safety General ADL Comments: Pt limited by [facility] status, decreased strength, decreased activity tolerance and decreased ability to care for self.   Cognition: Cognition Overall Cognitive Status: Impaired/Different from baseline Orientation Level: Oriented to person, Oriented to place Cognition Arousal/Alertness: Awake/alert Behavior During Therapy: WFL for tasks assessed/performed Overall Cognitive Status: Impaired/Different from baseline Area of Impairment: Problem solving, Awareness, Safety/judgement Safety/Judgement: Decreased awareness of safety, Decreased awareness of deficits Awareness: Intellectual Problem Solving:  Slow processing General Comments: HOH, slow processing. Poor awareness of deficits/safety.   Blood pressure 125/81, pulse 85, temperature 97.7 F (36.5 C), temperature source Axillary, resp. rate 17, height _0  (1.676 m), weight 74.8 kg, SpO2 93 %.   Physical Exam  General: Alert and oriented x 3, No apparent distress HEENT: Head is normocephalic, atraumatic, PERRLA, EOMI, sclera anicteric, oral mucosa pink and moist, dentition intact, ext ear canals clear,  Neck: Supple without JVD or lymphadenopathy Heart: Reg rate and rhythm. No murmurs rubs or gallops Chest: CTA bilaterally without wheezes, rales, or rhonchi; no distress Abdomen: Soft, non-tender, non-distended, bowel sounds positive. Extremities: No clubbing, cyanosis, or edema. Pulses are 2+ Skin: Clean and intact without signs of breakdown Neuro: Patient is alert in no acute distress.  Follows commands.  Spells  WORLD backward slowly but accurately. Provides his name and age.  He does exhibit some decreased awareness of deficits. 4+/5 strength throughout Musculoskeletal: Full ROM, No pain with AROM or PROM in the neck, trunk, or extremities. Posture appropriate Psych: Pt's affect is appropriate. Pt is cooperative   Lab Results Last 24 Hours  No results found for this or any previous visit (from the past 24 hour(s)).    Imaging Results (Last 48 hours)  CT ANGIO HEAD W OR WO CONTRAST   Result Date: 02/26/2020 CLINICAL DATA:  Follow-up examination for acute stroke. EXAM: CT ANGIOGRAPHY HEAD AND NECK TECHNIQUE: Multidetector CT imaging of the head and neck was performed using the standard protocol during bolus administration of intravenous contrast. Multiplanar CT image reconstructions and MIPs were obtained to evaluate the vascular anatomy. Carotid stenosis measurements (when applicable) are obtained utilizing NASCET criteria, using the distal internal carotid diameter as the denominator. CONTRAST:  133m OMNIPAQUE IOHEXOL 350 MG/ML  SOLN COMPARISON:  Prior MRI from 02/25/2020. FINDINGS: CT HEAD FINDINGS Brain: Age-related cerebral atrophy with chronic small vessel ischemic disease. Evolving cytotoxic edema within the right occipital lobe compatible with previously identified acute right PCA territory infarct. No associated mass effect. No other acute large vessel territory infarct. Mild hyperdensity seen involving the right parieto-occipital region, just superior to the area of infarction is seen, suspicious for small volume subarachnoid hemorrhage (series 5, image 19). No other acute intracranial hemorrhage. No mass lesion or midline shift. No hydrocephalus or extra-axial fluid collection. Vascular: No asymmetric hyperdense vessel. Calcified atherosclerosis seen about the skull base. Skull: Scalp soft tissues and calvarium within normal limits. Sinuses: Paranasal sinuses and mastoid air cells are clear. Orbits: Globes and orbital soft tissues demonstrate no acute finding. Review of the MIP images confirms the above findings CTA NECK FINDINGS Aortic arch: Visualized aortic arch of normal caliber with normal branch pattern. Mild-to-moderate atheromatous change about the aortic arch and origin of the great vessels without hemodynamically significant stenosis. Atheromatous plaque at the origin of the right subclavian artery with associated stenosis of up to 50% (series 12, image 195). Visualized subclavian arteries otherwise widely patent. Right carotid system: Scattered plaque within the mid-distal right CCA without significant stenosis. Bulky calcified plaque about the right bifurcation/proximal right ICA with associated stenosis of up to 40% by NASCET criteria. Right ICA tortuous but otherwise patent distally to the skull base. Left carotid system: Left common carotid artery patent from its origin to the bifurcation without stenosis. Bulky calcified plaque about the left bifurcation with associated stenosis of up to 50% by NASCET criteria. Left  ICA tortuous but otherwise patent to the skull base. Vertebral arteries: Both vertebral arteries arise from the subclavian arteries. Left vertebral artery dominant with a diffusely hypoplastic right vertebral artery. Left vertebral artery tortuous proximally. Vertebral arteries patent within the neck without stenosis, dissection, or occlusion. Skeleton: No acute osseous abnormality. No discrete or worrisome osseous lesions. Multilevel cervical spondylosis without high-grade stenosis. Other neck: No other acute soft tissue abnormality within the neck. 12 mm left thyroid nodule noted. No other mass lesion or adenopathy. Upper chest: Visualized upper chest demonstrates no acute finding. Centrilobular emphysema. Review of the MIP images confirms the above findings CTA HEAD FINDINGS Anterior circulation: Petrous segments patent bilaterally. Scattered atheromatous plaque within the cavernous/supraclinoid ICAs, right slightly worse than left. Relatively mild multifocal narrowing on the left. Short-segment moderate approximate 50% stenosis at the supraclinoid right ICA (series 11, image 119). A1 segments patent bilaterally. Normal anterior communicating artery  complex. Anterior cerebral arteries patent to their distal aspects without stenosis. Diffuse atheromatous irregularity throughout the M1 segments with associated mild multifocal narrowing. Normal MCA bifurcations. Distal MCA branches well perfused although demonstrate diffuse small vessel atheromatous irregularity. Posterior circulation: Scattered atheromatous plaque throughout the dominant left V4 segment without high-grade stenosis. Left PICA patent. Hypoplastic right vertebral artery largely terminates in PICA, and then subsequently occludes. Some retrograde filling into the distal right V4 segment across the vertebrobasilar junction. Right PICA patent. Basilar demonstrates mild atheromatous irregularity but is widely patent to its distal aspect. Superior cerebral  arteries patent bilaterally. Both PCAs primarily supplied via the basilar. Diffuse moderate to severe stenosis of the proximal left P2 segment. On the right, there is severe stenosis of the proximal-mid right P2 segment (series 14, image 24). Right PCA is markedly attenuated but grossly patent distally. Venous sinuses: Not well assessed due to timing the contrast bolus. Anatomic variants: Hypoplastic right vertebral artery largely terminating in PICA. Review of the MIP images confirms the above findings IMPRESSION: CT HEAD IMPRESSION: 1. Patchy evolving acute right PCA territory infarct, stable from previous MRA. 2. Patchy hyperdensity involving the right parietal lobe, suspicious for possible small volume subarachnoid hemorrhage. While laminar necrosis could conceivably have this appearance as well, there appears to be some FLAIR signal abnormality within the cortical sulci on previous brain MRI, favoring hemorrhage. 3. Underlying atrophy with chronic small vessel ischemic disease. CTA HEAD AND NECK IMPRESSION: 1. Negative CTA for emergent large vessel occlusion. 2. Severe stenosis of the proximal-mid right P2 segment, with markedly attenuated flow within the right PCA distally. 3. Moderate to severe proximal left P2 stenosis. 4. Hypoplastic right vertebral artery largely terminates in PICA. Moderate atheromatous irregularity throughout the dominant left V4 segment without high-grade stenosis. 5. Atheromatous plaque about the carotid bifurcations with associated stenosis of up to 40% on the right and 50% on the left. 6. 50% stenosis at the origin of the right subclavian artery. 7.  Emphysema (ICD10-J43.9). 8. 12 mm left thyroid nodule, of doubtful significance given size and patient age. No follow-up imaging recommended. (Ref: J Am Coll Radiol. 2015 Feb;12(2): 143-50). Critical Value/emergent results were called by telephone at the time of interpretation on 02/26/2020 at 2:33 am to provider Dr. Rory Percy, who verbally  acknowledged these results. Electronically Signed   By: Jeannine Boga M.D.   On: 02/26/2020 02:39    CT HEAD WO CONTRAST   Result Date: 02/27/2020 CLINICAL DATA:  Diabetes, hypertension, stroke EXAM: CT HEAD WITHOUT CONTRAST TECHNIQUE: Contiguous axial images were obtained from the base of the skull through the vertex without intravenous contrast. COMPARISON:  02/26/2020, 02/25/2020 FINDINGS: Brain: Continued evolution of the right occipital infarct seen previously. The subtle hyperdensity within the sulci in the right parietooccipital region just superior to the infarct bed again identified, consistent with petechial hemorrhage and unchanged since prior exam. There are stable hypodensities throughout the periventricular white matter consistent with chronic small vessel ischemic changes. Lateral ventricles and midline structures are stable. No acute extra-axial fluid collections. No mass effect. Vascular: No hyperdense vessel or unexpected calcification. Skull: Normal. Negative for fracture or focal lesion. Sinuses/Orbits: No acute finding. Other: None. IMPRESSION: 1. Stable right PCA territory infarct involving the right occipital lobe. 2. Stable petechial hemorrhage within the right parietooccipital sulci just superior to the infarct bed. Electronically Signed   By: Randa Ngo M.D.   On: 02/27/2020 02:53    CT ANGIO NECK W OR WO CONTRAST   Result Date: 02/26/2020  CLINICAL DATA:  Follow-up examination for acute stroke. EXAM: CT ANGIOGRAPHY HEAD AND NECK TECHNIQUE: Multidetector CT imaging of the head and neck was performed using the standard protocol during bolus administration of intravenous contrast. Multiplanar CT image reconstructions and MIPs were obtained to evaluate the vascular anatomy. Carotid stenosis measurements (when applicable) are obtained utilizing NASCET criteria, using the distal internal carotid diameter as the denominator. CONTRAST:  182m OMNIPAQUE IOHEXOL 350 MG/ML SOLN  COMPARISON:  Prior MRI from 02/25/2020. FINDINGS: CT HEAD FINDINGS Brain: Age-related cerebral atrophy with chronic small vessel ischemic disease. Evolving cytotoxic edema within the right occipital lobe compatible with previously identified acute right PCA territory infarct. No associated mass effect. No other acute large vessel territory infarct. Mild hyperdensity seen involving the right parieto-occipital region, just superior to the area of infarction is seen, suspicious for small volume subarachnoid hemorrhage (series 5, image 19). No other acute intracranial hemorrhage. No mass lesion or midline shift. No hydrocephalus or extra-axial fluid collection. Vascular: No asymmetric hyperdense vessel. Calcified atherosclerosis seen about the skull base. Skull: Scalp soft tissues and calvarium within normal limits. Sinuses: Paranasal sinuses and mastoid air cells are clear. Orbits: Globes and orbital soft tissues demonstrate no acute finding. Review of the MIP images confirms the above findings CTA NECK FINDINGS Aortic arch: Visualized aortic arch of normal caliber with normal branch pattern. Mild-to-moderate atheromatous change about the aortic arch and origin of the great vessels without hemodynamically significant stenosis. Atheromatous plaque at the origin of the right subclavian artery with associated stenosis of up to 50% (series 12, image 195). Visualized subclavian arteries otherwise widely patent. Right carotid system: Scattered plaque within the mid-distal right CCA without significant stenosis. Bulky calcified plaque about the right bifurcation/proximal right ICA with associated stenosis of up to 40% by NASCET criteria. Right ICA tortuous but otherwise patent distally to the skull base. Left carotid system: Left common carotid artery patent from its origin to the bifurcation without stenosis. Bulky calcified plaque about the left bifurcation with associated stenosis of up to 50% by NASCET criteria. Left ICA  tortuous but otherwise patent to the skull base. Vertebral arteries: Both vertebral arteries arise from the subclavian arteries. Left vertebral artery dominant with a diffusely hypoplastic right vertebral artery. Left vertebral artery tortuous proximally. Vertebral arteries patent within the neck without stenosis, dissection, or occlusion. Skeleton: No acute osseous abnormality. No discrete or worrisome osseous lesions. Multilevel cervical spondylosis without high-grade stenosis. Other neck: No other acute soft tissue abnormality within the neck. 12 mm left thyroid nodule noted. No other mass lesion or adenopathy. Upper chest: Visualized upper chest demonstrates no acute finding. Centrilobular emphysema. Review of the MIP images confirms the above findings CTA HEAD FINDINGS Anterior circulation: Petrous segments patent bilaterally. Scattered atheromatous plaque within the cavernous/supraclinoid ICAs, right slightly worse than left. Relatively mild multifocal narrowing on the left. Short-segment moderate approximate 50% stenosis at the supraclinoid right ICA (series 11, image 119). A1 segments patent bilaterally. Normal anterior communicating artery complex. Anterior cerebral arteries patent to their distal aspects without stenosis. Diffuse atheromatous irregularity throughout the M1 segments with associated mild multifocal narrowing. Normal MCA bifurcations. Distal MCA branches well perfused although demonstrate diffuse small vessel atheromatous irregularity. Posterior circulation: Scattered atheromatous plaque throughout the dominant left V4 segment without high-grade stenosis. Left PICA patent. Hypoplastic right vertebral artery largely terminates in PICA, and then subsequently occludes. Some retrograde filling into the distal right V4 segment across the vertebrobasilar junction. Right PICA patent. Basilar demonstrates mild atheromatous irregularity but is  widely patent to its distal aspect. Superior cerebral  arteries patent bilaterally. Both PCAs primarily supplied via the basilar. Diffuse moderate to severe stenosis of the proximal left P2 segment. On the right, there is severe stenosis of the proximal-mid right P2 segment (series 14, image 24). Right PCA is markedly attenuated but grossly patent distally. Venous sinuses: Not well assessed due to timing the contrast bolus. Anatomic variants: Hypoplastic right vertebral artery largely terminating in PICA. Review of the MIP images confirms the above findings IMPRESSION: CT HEAD IMPRESSION: 1. Patchy evolving acute right PCA territory infarct, stable from previous MRA. 2. Patchy hyperdensity involving the right parietal lobe, suspicious for possible small volume subarachnoid hemorrhage. While laminar necrosis could conceivably have this appearance as well, there appears to be some FLAIR signal abnormality within the cortical sulci on previous brain MRI, favoring hemorrhage. 3. Underlying atrophy with chronic small vessel ischemic disease. CTA HEAD AND NECK IMPRESSION: 1. Negative CTA for emergent large vessel occlusion. 2. Severe stenosis of the proximal-mid right P2 segment, with markedly attenuated flow within the right PCA distally. 3. Moderate to severe proximal left P2 stenosis. 4. Hypoplastic right vertebral artery largely terminates in PICA. Moderate atheromatous irregularity throughout the dominant left V4 segment without high-grade stenosis. 5. Atheromatous plaque about the carotid bifurcations with associated stenosis of up to 40% on the right and 50% on the left. 6. 50% stenosis at the origin of the right subclavian artery. 7.  Emphysema (ICD10-J43.9). 8. 12 mm left thyroid nodule, of doubtful significance given size and patient age. No follow-up imaging recommended. (Ref: J Am Coll Radiol. 2015 Feb;12(2): 143-50). Critical Value/emergent results were called by telephone at the time of interpretation on 02/26/2020 at 2:33 am to provider Dr. Rory Percy, who verbally  acknowledged these results. Electronically Signed   By: Jeannine Boga M.D.   On: 02/26/2020 02:39    MR Brain W and Wo Contrast   Result Date: 02/25/2020 CLINICAL DATA:  Dizziness and confusion EXAM: MRI HEAD WITHOUT AND WITH CONTRAST TECHNIQUE: Multiplanar, multiecho pulse sequences of the brain and surrounding structures were obtained without and with intravenous contrast. CONTRAST:  7.71m GADAVIST GADOBUTROL 1 MMOL/ML IV SOLN COMPARISON:  12/16/2014 FINDINGS: Brain: Patchy restricted diffusion in the right occipital and medial temporal cortex, including along the hippocampus. Minimal stippled restricted diffusion in the right thalamus. No acute hemorrhage, hydrocephalus, or masslike finding. Chronic blood products along the bilateral occipital cortex. Two remote white matter insults the left cerebral hemisphere and a remote left thalamic microhemorrhage. Mild remote cortical infarct along the left occipital convexity. Moderate chronic small vessel ischemia in the white matter. Vascular: Absent flow in the non dominant right vertebral artery by T2 weighted imaging. Skull and upper cervical spine: Negative for marrow lesion. Sinuses/Orbits: Negative IMPRESSION: 1. Patchy acute infarct in the right PCA territory. 2. Remote left occipital cortex infarct and remote bilateral superficial occipital hemorrhage. 3. Abnormal or absent flow in the non dominant right vertebral artery Electronically Signed   By: JMonte FantasiaM.D.   On: 02/25/2020 22:30    ECHOCARDIOGRAM COMPLETE   Result Date: 02/26/2020    ECHOCARDIOGRAM REPORT   Patient Name:   ALADDIE MATHDate of Exam: 02/26/2020 Medical Rec #:  [id number]    Height:       66.0 in Accession #:    [id number]   Weight:       165.0 lb Date of Birth:  [birth year]    BSA:  1.843 m Patient Age:    26 years      BP:           102/68 mmHg Patient Gender: M             HR:           91 bpm. Exam Location:  Inpatient Procedure: 2D Echo Indications:    CVA   History:        Patient has prior history of Echocardiogram examinations, most                 recent 08/04/2018. CAD; Risk Factors:Hypertension, Diabetes and                 Dyslipidemia.  Sonographer:    Johny Chess Referring Phys: [phone] BRIAN MILLER  Sonographer Comments: Technically difficult study due to poor echo windows. IMPRESSIONS  1. Left ventricular ejection fraction, by estimation, is 60 to 65%. The left ventricle has normal function. The left ventricle has no regional wall motion abnormalities. Left ventricular diastolic function could not be evaluated.  2. Right ventricular systolic function is normal. The right ventricular size is normal.  3. The mitral valve is grossly normal. No evidence of mitral valve regurgitation. No evidence of mitral stenosis.  4. The aortic valve is tricuspid. Aortic valve regurgitation is not visualized. Mild aortic valve sclerosis is present, with no evidence of aortic valve stenosis.  5. Aortic dilatation noted. There is moderate dilatation of the ascending aorta measuring 42 mm. FINDINGS  Left Ventricle: Left ventricular ejection fraction, by estimation, is 60 to 65%. The left ventricle has normal function. The left ventricle has no regional wall motion abnormalities. The left ventricular internal cavity size was normal in size. There is  no left ventricular hypertrophy. Left ventricular diastolic function could not be evaluated. Right Ventricle: The right ventricular size is normal. No increase in right ventricular wall thickness. Right ventricular systolic function is normal. Left Atrium: Left atrial size was normal in size. Right Atrium: Right atrial size was not well visualized. Pericardium: Trivial pericardial effusion is present. Mitral Valve: The mitral valve is grossly normal. No evidence of mitral valve regurgitation. No evidence of mitral valve stenosis. Tricuspid Valve: The tricuspid valve is normal in structure. Tricuspid valve regurgitation is trivial.  No evidence of tricuspid stenosis. Aortic Valve: The aortic valve is tricuspid. . There is mild thickening and mild calcification of the aortic valve. Aortic valve regurgitation is not visualized. Mild aortic valve sclerosis is present, with no evidence of aortic valve stenosis. There is mild thickening of the aortic valve. There is mild calcification of the aortic valve. Pulmonic Valve: The pulmonic valve was grossly normal. Pulmonic valve regurgitation is trivial. No evidence of pulmonic stenosis. Aorta: Aortic dilatation noted. There is moderate dilatation of the ascending aorta measuring 42 mm. IAS/Shunts: The atrial septum is grossly normal.  LEFT VENTRICLE PLAX 2D LVIDd:         4.10 cm LVIDs:         2.70 cm LV PW:         1.20 cm LV IVS:        1.10 cm LVOT diam:     2.10 cm LV SV:         39 LV SV Index:   21 LVOT Area:     3.46 cm  LEFT ATRIUM           Index LA diam:      4.20 cm 2.28 cm/m  LA Vol (A4C): 43.9 ml 23.82 ml/m  AORTIC VALVE LVOT Vmax:   61.80 cm/s LVOT Vmean:  39.800 cm/s LVOT VTI:    0.112 m  AORTA Ao Root diam: 3.60 cm Ao Asc diam:  4.20 cm MV E velocity: 79.50 cm/s MV A velocity: 125.00 cm/s  SHUNTS MV E/A ratio:  0.64         Systemic VTI:  0.11 m                             Systemic Diam: 2.10 cm Mertie Moores MD Electronically signed by Mertie Moores MD Signature Date/Time: 02/26/2020/12:54:14 PM    Final        Assessment/Plan: Diagnosis: Right PCA acute infarct 1. Does the need for close, 24 hr/day medical supervision in concert with the patient's rehab needs make it unreasonable for this patient to be served in a less intensive setting? Yes 2. Co-Morbidities requiring supervision/potential complications: chronic systolic heart failure, coronary artery disease, hypertension, hyperlipidemia, bilateral lower extremity neuropathy, diabetes, overweight 3. Due to bladder management, bowel management, safety, skin/wound care, disease management, medication administration, pain  management and patient education, does the patient require 24 hr/day rehab nursing? Yes 4. Does the patient require coordinated care of a physician, rehab nurse, therapy disciplines of PT, OT to address physical and functional deficits in the context of the above medical diagnosis(es)? Yes Addressing deficits in the following areas: balance, endurance, locomotion, strength, transferring, bowel/bladder control, bathing, dressing, feeding, grooming, toileting and psychosocial support 5. Can the patient actively participate in an intensive therapy program of at least 3 hrs of therapy per day at least 5 days per week? Yes 6. The potential for patient to make measurable gains while on inpatient rehab is excellent 7. Anticipated functional outcomes upon discharge from inpatient rehab are modified independent  with PT, modified independent with OT, modified independent with SLP. 8. Estimated rehab length of stay to reach the above functional goals is: 12-14 days 9. Anticipated discharge destination: Home 10. Overall Rehab/Functional Prognosis: excellent   RECOMMENDATIONS: This patient's condition is appropriate for continued rehabilitative care in the following setting: CIR Patient has agreed to participate in recommended program. Yes Note that insurance prior authorization may be required for reimbursement for recommended care.   Comment: Mr. Pettway would be an excellent CIR candidate. He lives alone but will have significant assistance from his ex-wife upon discharge. Thank you for this consult. We will continue to follow in Mr. Canupp care.    Lavon Paganini Angiulli, PA-C 02/27/2020    I have personally performed a face to face diagnostic evaluation, including, but not limited to relevant history and physical exam findings, of this patient and developed relevant assessment and plan.  Additionally, I have reviewed and concur with the physician assistant's documentation above.   Leeroy Cha, D         Revision History                     Routing History

## 2020-03-02 ENCOUNTER — Inpatient Hospital Stay (HOSPITAL_COMMUNITY): Payer: Medicare Other | Admitting: Speech Pathology

## 2020-03-02 ENCOUNTER — Inpatient Hospital Stay (HOSPITAL_COMMUNITY): Payer: Medicare Other | Admitting: Occupational Therapy

## 2020-03-02 ENCOUNTER — Inpatient Hospital Stay (HOSPITAL_COMMUNITY): Payer: Medicare Other | Admitting: Physical Therapy

## 2020-03-02 DIAGNOSIS — I63531 Cerebral infarction due to unspecified occlusion or stenosis of right posterior cerebral artery: Secondary | ICD-10-CM

## 2020-03-02 DIAGNOSIS — I5022 Chronic systolic (congestive) heart failure: Secondary | ICD-10-CM

## 2020-03-02 DIAGNOSIS — I10 Essential (primary) hypertension: Secondary | ICD-10-CM

## 2020-03-02 DIAGNOSIS — E119 Type 2 diabetes mellitus without complications: Secondary | ICD-10-CM

## 2020-03-02 LAB — COMPREHENSIVE METABOLIC PANEL
ALT: 13 U/L (ref 0–44)
AST: 17 U/L (ref 15–41)
Albumin: 3.5 g/dL (ref 3.5–5.0)
Alkaline Phosphatase: 38 U/L (ref 38–126)
Anion gap: 12 (ref 5–15)
BUN: 18 mg/dL (ref 8–23)
CO2: 23 mmol/L (ref 22–32)
Calcium: 8.7 mg/dL — ABNORMAL LOW (ref 8.9–10.3)
Chloride: 103 mmol/L (ref 98–111)
Creatinine, Ser: 1.12 mg/dL (ref 0.61–1.24)
GFR calc Af Amer: >60 mL/min (ref >60)
GFR calc non Af Amer: >60 mL/min (ref >60)
Glucose, Bld: 107 mg/dL — ABNORMAL HIGH (ref 70–99)
Potassium: 3.6 mmol/L (ref 3.5–5.1)
Sodium: 138 mmol/L (ref 135–145)
Total Bilirubin: 2.1 mg/dL — ABNORMAL HIGH (ref 0.3–1.2)
Total Protein: 6.1 g/dL — ABNORMAL LOW (ref 6.5–8.1)

## 2020-03-02 LAB — CBC WITH DIFFERENTIAL/PLATELET
Abs Immature Granulocytes: 0.02 10*3/uL (ref 0.00–0.07)
Basophils Absolute: 0.1 10*3/uL (ref 0.0–0.1)
Basophils Relative: 1 %
Eosinophils Absolute: 0.3 10*3/uL (ref 0.0–0.5)
Eosinophils Relative: 4 %
HCT: 46.5 % (ref 39.0–52.0)
Hemoglobin: 15.1 g/dL (ref 13.0–17.0)
Immature Granulocytes: 0 %
Lymphocytes Relative: 25 %
Lymphs Abs: 1.7 10*3/uL (ref 0.7–4.0)
MCH: 29.2 pg (ref 26.0–34.0)
MCHC: 32.5 g/dL (ref 30.0–36.0)
MCV: 89.8 fL (ref 80.0–100.0)
Monocytes Absolute: 0.6 10*3/uL (ref 0.1–1.0)
Monocytes Relative: 9 %
Neutro Abs: 4 10*3/uL (ref 1.7–7.7)
Neutrophils Relative %: 61 %
Platelets: 216 10*3/uL (ref 150–400)
RBC: 5.18 MIL/uL (ref 4.22–5.81)
RDW: 13.3 % (ref 11.5–15.5)
WBC: 6.6 10*3/uL (ref 4.0–10.5)
nRBC: 0 % (ref 0.0–0.2)

## 2020-03-02 MED ORDER — LIVING BETTER WITH HEART FAILURE BOOK: Freq: Once | Status: AC

## 2020-03-02 MED ORDER — LIVING WELL WITH DIABETES BOOK
Freq: Once | Status: AC
  Filled 2020-03-02: qty 1

## 2020-03-02 MED ORDER — BLOOD PRESSURE CONTROL BOOK
Freq: Once | Status: AC
  Filled 2020-03-02: qty 1

## 2020-03-02 NOTE — Progress Notes (Signed)
Inpatient Rehabilitation  Patient information reviewed and entered into eRehab system by Ronique Simerly M. Saagar Tortorella, M.A., CCC/SLP, PPS Coordinator.  Information including medical coding, functional ability and quality indicators will be reviewed and updated through discharge.    

## 2020-03-02 NOTE — IPOC Note (Signed)
Individualized overall Plan of Care (IPOC) Patient Details Name: Christian [name] MRN: [id number] DOB: 1941/08/09  Admitting Diagnosis: Acute ischemic right posterior cerebral artery (PCA) stroke [facility])  Hospital Problems: Principal Problem:   Acute ischemic right posterior cerebral artery (PCA) stroke ([place]) Active Problems:   Diabetes mellitus type 2 in nonobese ([place])   Chronic systolic congestive heart failure ([place])     Functional Problem List: Nursing Medication Management, Safety, Endurance  PT Balance, Safety, Endurance, Motor  OT Balance, Cognition, Endurance, Motor, Safety, Vision, Other (Comment)(Emotional lability)  SLP Cognition  TR         Basic ADL's: OT Grooming, Bathing, Dressing, Toileting     Advanced  ADL's: OT       Transfers: PT Bed Mobility, Bed to Chair, Car, Manufacturing systems engineer, Metallurgist: PT Ambulation, Stairs     Additional Impairments: OT    SLP Social Cognition   Problem Solving, Memory, Attention, Awareness  TR      Anticipated Outcomes Item Anticipated Outcome  Self Feeding    Swallowing      Basic self-care     Toileting      Bathroom Transfers    Bowel/Bladder  cont x 2, mod I assist  Transfers  independent w/ all transfers  Locomotion  supervision for amb w/ LRAD.  Communication     Cognition  min assist  Pain  less than 3  Safety/Judgment  cues/reminders   Therapy Plan: PT Intensity: Minimum of 1-2 x/day ,45 to 90 minutes PT Frequency: 5 out of 7 days PT Duration Estimated Length of Stay: 7-10 days. OT Intensity: Minimum of 1-2 x/day, 45 to 90 minutes OT Frequency: 5 out of 7 days OT Duration/Estimated Length of Stay: 5-7 days SLP Intensity: Minumum of 1-2 x/day, 30 to 90 minutes SLP Frequency: 3 to 5 out of 7 days SLP Duration/Estimated Length of Stay: 7-10 days    Team Interventions: Nursing Interventions Patient/Family Education, Disease Management/Prevention, Medication Management,  Discharge Planning  PT interventions Ambulation/gait training, Discharge planning, DME/adaptive equipment instruction, Functional mobility training, Therapeutic Activities, UE/LE Strength taining/ROM, Training and development officer, Community reintegration, Neuromuscular re-education, Barrister's clerk education, IT trainer, Therapeutic Exercise, UE/LE Coordination activities  OT Interventions Training and development officer, Cognitive remediation/compensation, Academic librarian, Discharge planning, DME/adaptive equipment instruction, Functional mobility training, Neuromuscular re-education, Patient/family education, Psychosocial support, Self Care/advanced ADL retraining, Therapeutic Activities, Therapeutic Exercise, UE/LE Strength taining/ROM, UE/LE Coordination activities, Visual/perceptual remediation/compensation  SLP Interventions Cognitive remediation/compensation, Cueing hierarchy, Functional tasks, Patient/family education, Internal/external aids, Environmental controls  TR Interventions    SW/CM Interventions Discharge Planning, Psychosocial Support, Patient/Family Education   Barriers to Discharge MD  Medical stability  Nursing      PT Lack of/limited family support Lives alone w/ 2 dogs, S.O able to assist if needed.  OT Home environment access/layout, Lack of/limited family support    SLP      SW       Team Discharge Planning: Destination: PT-Home ,OT- Home , SLP-Home Projected Follow-up: PT-Home health PT, OT-   , SLP-Home Health SLP, Outpatient SLP, 24 hour supervision/assistance Projected Equipment Needs: PT-To be determined, OT- To be determined, SLP-None recommended by SLP Equipment Details: PT-pt is able to borrow shower seat, has no other DME., OT-  Patient/family involved in discharge planning: PT- Patient,  OT-Patient, SLP-Patient  MD ELOS: 5-8 days. Medical Rehab Prognosis:  Good Assessment: Right-handed male with history of chronic systolic congestive heart  failure maintained on Entresto, CAD/nonischemic cardiomyopathy maintained on aspirin, hypertension, diabetes  mellitus.  Presented 02/26/2020 with dizziness/lightheaded and increasing numbness bilateral lower extremities.  MRI showed patchy acute infarct in the right PCA territory.  Remote left occipital cortex infarct and remote bilateral superficial occipital hemorrhage.  CT angiogram head and neck negative for emergent large vessel occlusion.  Echocardiogram with ejection fraction of 65% no wall motion abnormalities or emboli identified.  Patient did not receive TPA.  Admission chemistries unremarkable.  A follow-up CT scan was completed 02/27/2020 showing stable right PCA territory infarct with a small petechial hemorrhage within the right parieto-occipital sulci just superior to the infarct bed.  Currently maintained on low-dose aspirin for CVA as well as Plavix prophylaxis x3 months then aspirin alone.  Patient with resulting functional deficits with mobility, transfers, balance, self-care.  Will set goals for Supervision with PT/OT and Min a with SLP.   Due to the current state of emergency, patients may not be receiving their 3-hours of Medicare-mandated therapy.  See Team Conference Notes for weekly updates to the plan of care

## 2020-03-02 NOTE — Evaluation (Signed)
Speech Language Pathology Assessment and Plan  Patient Details  Name: Christian [name] MRN: [id number] Date of Birth: 11-07-1940  SLP Diagnosis: Cognitive Impairments  Rehab Potential: Good ELOS: 7-10 days    Today's Date: 03/02/2020 SLP Individual Time: 1020-1120 SLP Individual Time Calculation (min): 60 min   Problem List:  Patient Active Problem List   Diagnosis Date Noted  . Diabetes mellitus type 2 in nonobese (HCC)   . Chronic systolic congestive heart failure ([place])   . Acute ischemic right posterior cerebral artery (PCA) stroke ([place]) 03/01/2020  . Acute ischemic stroke ([place]) 02/26/2020  . Allergic rhinitis due to pollen 03/29/2018  . Oropharyngeal dysphagia 01/11/2018  . GERD with esophagitis 01/11/2018  . Claudication of both lower extremities ([place]) 01/06/2018  . Nonischemic cardiomyopathy ([place]) 11/11/2017  . Coronary artery disease involving native coronary artery of native heart without angina pectoris 11/11/2017  . Chronic systolic heart failure ([place])   . Essential hypertension 07/08/2017  . Depression with anxiety 07/08/2017  . Type 2 diabetes mellitus with complication, without long-term current use of insulin ([place]) 07/08/2017  . Hyperlipidemia LDL goal <130 05/25/2017   Past Medical History:  Past Medical History:  Diagnosis Date  . Allergic rhinitis due to pollen 03/29/2018  . Chronic systolic heart failure (HCC) - EF improved to normal on Rx    Echo 11/18: Mild LVH, EF 20, diffuse HK mild AI, a sending aorta 42 mm (mildly dilated), mild MR, mild TR, trivial PI, PASP 44 // Echo 3/19: mild LVH, EF 25-30, diff HK, Gr 1 DD, asc aorta 43 mm (mildly dilated), MAC, mild reduced RVSF // Limited echo 10/19: Mild concentric LVH, EF 52-84, grade 1 diastolic dysfunction, MAC, mild LAE   . Coronary artery disease involving native coronary artery of native heart without angina pectoris 11/11/2017   R/L HC 11/18: pLAD 45, oD1 65 ; elevated PCWP (mean 29), moderate pulmonary  hypertension, CO 3.66, CI 2.07  . Depression with anxiety 07/08/2017  . Essential hypertension 07/08/2017  . GERD with esophagitis 01/11/2018  . Hernia of abdominal cavity   . Hyperlipidemia   . Hyperlipidemia LDL goal <130 05/25/2017   The 10-year ASCVD risk score Mikey Bussing DC Jr., et al., 2013) is: 31.1%   Values used to calculate the score:     Age: 89 years     Sex: Male     Is Non-Hispanic African American: No     Diabetic: No     Tobacco smoker: No     Systolic Blood Pressure: 132 mmHg     Is BP treated: No     HDL Cholesterol: 68.7 mg/dL     Total Cholesterol: 258 mg/dL   . Idiopathic inflammatory myopathy 05/25/2017  . Neuropathy involving both lower extremities 05/25/2017  . Nonischemic cardiomyopathy ([place]) 11/11/2017   LHC 09/16/17-nonobstructive CAD  . Oropharyngeal dysphagia 01/11/2018  . Type 2 diabetes mellitus with complication, without long-term current use of insulin ([place]) 07/08/2017   Past Surgical History:  Past Surgical History:  Procedure Laterality Date  . BACK SURGERY    . HERNIA REPAIR    . RIGHT/LEFT HEART CATH AND CORONARY ANGIOGRAPHY N/A 09/16/2017   Procedure: RIGHT/LEFT HEART CATH AND CORONARY ANGIOGRAPHY;  Surgeon: Martinique, Peter M, MD;  Location: [facility];  Service: Cardiovascular;  Laterality: N/A;  . SPINE SURGERY      Assessment / Plan / Recommendation Clinical Impression   Christian [name] is a 79 year old right-handed male with history of chronic systolic congestive heart failure maintained  on Entresto, CAD/nonischemic cardiomyopathy maintained on aspirin, hypertension, diabetes mellitus.  Per chart review lives alone.  1 level home 3 steps to entry.  Works for a [employer].  He has an ex-wife who can assist on discharge.  Presented 02/26/2020 with dizziness/lightheaded and increasing numbness bilateral lower extremities.  MRI showed patchy acute infarct in the right PCA territory.  Remote left occipital cortex infarct and remote bilateral superficial occipital  hemorrhage.  CT angiogram head and neck negative for emergent large vessel occlusion.  Echocardiogram with ejection fraction of 65% no wall motion abnormalities or emboli identified.  Patient did not receive TPA.  Admission chemistries unremarkable.  A follow-up CT scan was completed 02/27/2020 showing stable right PCA territory infarct with a small petechial hemorrhage within the right parieto-occipital sulci just superior to the infarct bed.  Currently maintained on low-dose aspirin for CVA as well as Plavix prophylaxis x3 months then aspirin alone.  Patient was later placed on Lovenox for DVT prophylaxis 02/29/2020 tolerating a regular diet.  Therapy evaluations completed and patient was admitted for a comprehensive rehab program.  SLP evaluation was completed on 03/02/20 with results as follows: Pt presents with moderate cognitive deficits characterized by decreased retrieval of information and recall of new information, decreased functional problem solving, left inattention versus other visual field disturbance, and decreased awareness of deficits.  Pt is also hard of hearing which was accounted for during today's evaluation.  Currently pt requires overall mod assist to complete basic, functional tasks.  As a result, pt would benefit from skilled ST while inpatient in order to maximize functional independence and reduce burden of care prior to discharge.  Anticipate that pt will need 24/7 supervision at discharge in addition to [place] follow up at next level of care.     Skilled Therapeutic Interventions          Cognitive-linguistic evaluation completed with results and recommendations reviewed with patient.     SLP Assessment  Patient will need skilled [place] [facility] during CIR admission    Recommendations  Recommendations for Other Services: Neuropsych consult Patient destination: Home Follow up Recommendations: Home Health SLP;Outpatient SLP;24 hour  supervision/assistance Equipment Recommended: None recommended by SLP    SLP Frequency 3 to 5 out of 7 days   SLP Duration  SLP Intensity  SLP Treatment/Interventions 7-10 days  Minumum of 1-2 x/day, 30 to 90 minutes  Cognitive remediation/compensation;Cueing hierarchy;Functional tasks;Patient/family education;Internal/external aids;Environmental controls    Pain Pain Assessment Pain Scale: 0-10 Pain Score: 0-No pain Faces Pain Scale: No hurt  Prior Functioning Cognitive/Linguistic Baseline: Within functional limits Type of Home: House  Lives With: Alone Available Help at Discharge: Family;Available PRN/intermittently Vocation: Retired  SLP Evaluation Cognition Overall Cognitive Status: Impaired/Different from baseline Arousal/Alertness: Awake/alert Orientation Level: Oriented X4 Attention: Selective Focused Attention: Appears intact Sustained Attention: Appears intact Selective Attention: Impaired Selective Attention Impairment: Functional basic Memory: Impaired Memory Impairment: Retrieval deficit;Decreased recall of new information Awareness: Impaired Awareness Impairment: Emergent impairment Problem Solving: Impaired Problem Solving Impairment: Functional basic Executive Function: Organizing;Self Monitoring;Self Correcting Organizing: Impaired Organizing Impairment: Functional basic Self Monitoring: Impaired Self Monitoring Impairment: Functional basic Self Correcting: Impaired Self Correcting Impairment: Functional basic Behaviors: Lability Safety/Judgment: Impaired Comments: left inattention  Comprehension Auditory Comprehension Overall Auditory Comprehension: Appears within functional limits for tasks assessed Expression Expression Primary Mode of Expression: Verbal Verbal Expression Overall Verbal Expression: Appears within functional limits for tasks assessed Oral Motor Oral Motor/Sensory Function Overall Oral Motor/Sensory Function: Within  functional limits Motor  Speech Overall Motor Speech: Appears within functional limits for tasks assessed   Short Term Goals: Week 1: SLP Short Term Goal 1 (Week 1): STG=LTG due to ELOS  Refer to Care Plan for Long Term Goals  Recommendations for other services: Neuropsych  Discharge Criteria: Patient will be discharged from SLP if patient refuses treatment 3 consecutive times without medical reason, if treatment goals not met, if there is a change in medical status, if patient makes no progress towards goals or if patient is discharged from hospital.  The above assessment, treatment plan, treatment alternatives and goals were discussed and mutually agreed upon: by patient  Emilio Math 03/02/2020, 12:38 PM

## 2020-03-02 NOTE — Plan of Care (Signed)
  Problem: Consults Goal: RH STROKE PATIENT EDUCATION Description: See Patient Education module for education specifics  Outcome: Progressing   Problem: RH SAFETY Goal: RH STG ADHERE TO SAFETY PRECAUTIONS W/ASSISTANCE/DEVICE Description: STG Adhere to Safety Precautions With cues/reminders Assistance/Device. Outcome: Progressing   Problem: RH KNOWLEDGE DEFICIT Goal: RH STG INCREASE KNOWLEDGE OF HYPERTENSION Description: Pt will be able to demonstrate understanding of HTN management with medication compliance and diet control using handouts/booklets with mod I assist prior to DC Outcome: Progressing Goal: RH STG INCREASE KNOWLEGDE OF HYPERLIPIDEMIA Description: Pt will be able to demonstrate understanding of HLD management with medication compliance and diet control using handouts/booklets with mod I assist prior to DC Outcome: Progressing Goal: RH STG INCREASE KNOWLEDGE OF STROKE PROPHYLAXIS Description: Pt will be able to demonstrate understanding of stroke prevention with medication compliance and diet control using handouts/booklets with mod I assist prior to DC Outcome: Progressing

## 2020-03-02 NOTE — Evaluation (Signed)
Physical Therapy Assessment and Plan  Patient Details  Name: Christian [name] MRN: [id number] Date of Birth: [birth year]  PT Diagnosis: Abnormal posture, Abnormality of gait, Coordination disorder and Difficulty walking Rehab Potential: Good ELOS: 7-10 days.   Today's Date: 03/02/2020 PT Individual Time: 0803-0900 PT Individual Time Calculation (min): 57 min    Problem List:  Patient Active Problem List   Diagnosis Date Noted  . Diabetes mellitus type 2 in nonobese (HCC)   . Chronic systolic congestive heart failure ([place])   . Acute ischemic right posterior cerebral artery (PCA) stroke ([place]) 03/01/2020  . Acute ischemic stroke ([place]) 02/26/2020  . Allergic rhinitis due to pollen 03/29/2018  . Oropharyngeal dysphagia 01/11/2018  . GERD with esophagitis 01/11/2018  . Claudication of both lower extremities ([place]) 01/06/2018  . Nonischemic cardiomyopathy ([place]) 11/11/2017  . Coronary artery disease involving native coronary artery of native heart without angina pectoris 11/11/2017  . Chronic systolic heart failure ([place])   . Essential hypertension 07/08/2017  . Depression with anxiety 07/08/2017  . Type 2 diabetes mellitus with complication, without long-term current use of insulin ([place]) 07/08/2017  . Hyperlipidemia LDL goal <130 05/25/2017    Past Medical History:  Past Medical History:  Diagnosis Date  . Allergic rhinitis due to pollen 03/29/2018  . Chronic systolic heart failure (HCC) - EF improved to normal on Rx    Echo 11/18: Mild LVH, EF 20, diffuse HK mild AI, a sending aorta 42 mm (mildly dilated), mild MR, mild TR, trivial PI, PASP 44 // Echo 3/19: mild LVH, EF 25-30, diff HK, Gr 1 DD, asc aorta 43 mm (mildly dilated), MAC, mild reduced RVSF // Limited echo 10/19: Mild concentric LVH, EF 63-87, grade 1 diastolic dysfunction, MAC, mild LAE   . Coronary artery disease involving native coronary artery of native heart without angina pectoris 11/11/2017   R/L HC 11/18: pLAD 45, oD1 65 ;  elevated PCWP (mean 29), moderate pulmonary hypertension, CO 3.66, CI 2.07  . Depression with anxiety 07/08/2017  . Essential hypertension 07/08/2017  . GERD with esophagitis 01/11/2018  . Hernia of abdominal cavity   . Hyperlipidemia   . Hyperlipidemia LDL goal <130 05/25/2017   The 10-year ASCVD risk score Mikey Bussing DC Jr., et al., 2013) is: 31.1%   Values used to calculate the score:     Age: 28 years     Sex: Male     Is Non-Hispanic African American: No     Diabetic: No     Tobacco smoker: No     Systolic Blood Pressure: 564 mmHg     Is BP treated: No     HDL Cholesterol: 68.7 mg/dL     Total Cholesterol: 258 mg/dL   . Idiopathic inflammatory myopathy 05/25/2017  . Neuropathy involving both lower extremities 05/25/2017  . Nonischemic cardiomyopathy ([place]) 11/11/2017   LHC 09/16/17-nonobstructive CAD  . Oropharyngeal dysphagia 01/11/2018  . Type 2 diabetes mellitus with complication, without long-term current use of insulin ([place]) 07/08/2017   Past Surgical History:  Past Surgical History:  Procedure Laterality Date  . BACK SURGERY    . HERNIA REPAIR    . RIGHT/LEFT HEART CATH AND CORONARY ANGIOGRAPHY N/A 09/16/2017   Procedure: RIGHT/LEFT HEART CATH AND CORONARY ANGIOGRAPHY;  Surgeon: Martinique, Peter M, MD;  Location: [facility];  Service: Cardiovascular;  Laterality: N/A;  . SPINE SURGERY      Assessment & Plan Clinical Impression:  Christian [name] is a 79 year old right-handed male with history of chronic systolic  congestive heart failure maintained on Entresto, CAD/nonischemic cardiomyopathy maintained on aspirin, hypertension, diabetes mellitus.  Per chart review lives alone.  1 level home 3 steps to entry.  Works for a [employer].  He has an ex-wife who can assist on discharge.  Presented 02/26/2020 with dizziness/lightheaded and increasing numbness bilateral lower extremities.  MRI showed patchy acute infarct in the right PCA territory.  Remote left occipital cortex infarct and remote  bilateral superficial occipital hemorrhage.  CT angiogram head and neck negative for emergent large vessel occlusion.  Echocardiogram with ejection fraction of 65% no wall motion abnormalities or emboli identified.  Patient did not receive TPA.  Admission chemistries unremarkable.  A follow-up CT scan was completed 02/27/2020 showing stable right PCA territory infarct with a small petechial hemorrhage within the right parieto-occipital sulci just superior to the infarct bed.  Currently maintained on low-dose aspirin for CVA as well as Plavix prophylaxis x3 months then aspirin alone.  Patient was later placed on Lovenox for DVT prophylaxis 02/29/2020 tolerating a regular diet.  Therapy evaluations completed and patient was admitted for a comprehensive rehab program.  Patient currently requires min with mobility secondary to decreased coordination and decreased motor planning and decreased standing balance and decreased postural control.  Prior to hospitalization, patient was independent  with mobility and lived with Alone in a House home.  Home access is 3Stairs to enter.  Patient will benefit from skilled PT intervention to maximize safe functional mobility, minimize fall risk and decrease caregiver burden for planned discharge home with intermittent assist.  Anticipate patient will benefit from follow up [facility] at discharge.  PT - End of Session Activity Tolerance: Tolerates 30+ min activity with multiple rests Endurance Deficit: Yes PT Assessment Rehab Potential (ACUTE/IP ONLY): Good PT Barriers to Discharge: Lack of/limited family support PT Barriers to Discharge Comments: Lives alone w/ 2 dogs, S.O able to assist if needed. PT Patient demonstrates impairments in the following area(s): Balance;Safety;Endurance;Motor PT Transfers Functional Problem(s): Bed Mobility;Bed to Chair;Car;Furniture PT Locomotion Functional Problem(s): Ambulation;Stairs PT Plan PT Intensity: Minimum of 1-2 x/day ,45 to 90  minutes PT Frequency: 5 out of 7 days PT Duration Estimated Length of Stay: 7-10 days. PT Treatment/Interventions: Ambulation/gait training;Discharge planning;DME/adaptive equipment instruction;Functional mobility training;Therapeutic Activities;UE/LE Strength taining/ROM;Balance/vestibular training;Community reintegration;Neuromuscular re-education;Patient/family education;Stair training;Therapeutic Exercise;UE/LE Coordination activities PT Transfers Anticipated Outcome(s): independent w/ all transfers PT Locomotion Anticipated Outcome(s): supervision for amb w/ LRAD. PT Recommendation Follow Up Recommendations: Home health PT Patient destination: Home Equipment Recommended: To be determined Equipment Details: pt is able to borrow shower seat, has no other DME.  Skilled Therapeutic Intervention Evaluation completed (see details above and below) with education on PT POC and goals and individual treatment initiated with focus on gait, balance, safety.  Pt presents sitting EOB and wishing to lie back down 2/2 c/o numbness BLEs.  Pt transferred sit to supine w/ supervision, using side rails.  Pt then transferred sup to sit w/ supervision and log roll to left.  Pt performed multiple sit to stand transfers and SPT bed > w/c w/ min A and verbal cues.  Pt required min A for car transfer w/ education on safety.  Pt negotiated 12 steps # x 4 steps) of 8"steps w/ Bilateral railing and min A, reciprocal gait ascending, step-to pattern descending.  Pt amb multiple trials w/ RW and CGA w/ improved reciprocal gait pattern, although increased shuffling w/ turns and confined spaces up to 75' per trial.  Pt amb w/ HHA short distances w/ noted  shuffling and decreased step length.  Pt returned to room and remained in w/c w/ chair alarm on and all needs in reach.  PT Evaluation Precautions/Restrictions Precautions Precautions: Fall Precaution Comments: L hemianopsia Restrictions Weight Bearing Restrictions:  No General Chart Reviewed: Yes Family/Caregiver Present: No Vital Signs  Pain Pain Assessment Pain Scale: 0-10 Faces Pain Scale: No hurt Home Living/Prior Functioning Home Living Available Help at Discharge: Family;Available PRN/intermittently(ex-wife and friend can help as needed.) Type of Home: House Home Access: Stairs to enter [employer] of Steps: 3 Entrance Stairs-Rails: Right(Pt cannot remember if has 2 rails.) Home Layout: One level Bathroom Shower/Tub: Multimedia programmer: Standard Bathroom Accessibility: Yes  Lives With: Alone Prior Function Level of Independence: Independent with transfers;Independent with gait  Able to Take Stairs?: Yes Driving: Yes Vocation: Retired Comments: Lives with 2 dogs [place]. Does his own ADLs/IADLs, Working for a [employer]. Drives. Vision/Perception     Cognition Overall Cognitive Status: Impaired/Different from baseline Arousal/Alertness: Awake/alert Orientation Level: Oriented X4 Attention: Focused;Sustained Focused Attention: Appears intact Sustained Attention: Appears intact Memory: Impaired Memory Impairment: Decreased recall of new information;Retrieval deficit Sensation Sensation Light Touch: Appears Intact Coordination Gross Motor Movements are Fluid and Coordinated: No Heel Shin Test: decreased bilaterally. Motor  Motor Motor: Within Functional Limits;Hemiplegia  Mobility Bed Mobility Bed Mobility: Rolling Right;Sit to Supine;Supine to Sit Rolling Right: Supervision/verbal cueing Supine to Sit: Supervision/Verbal cueing(using side rails.) Sit to Supine: Supervision/Verbal cueing(using side rails.) Transfers Transfers: Sit to Stand;Stand to Sit;Stand Pivot Transfers Sit to Stand: Contact Guard/Touching assist Stand to Sit: Contact Guard/Touching assist Stand Pivot Transfers: Minimal Assistance - Patient > 75% Stand Pivot Transfer Details: Verbal cues for technique;Verbal cues  for sequencing;Verbal cues for precautions/safety Transfer (Assistive device): None Locomotion  Gait Ambulation: Yes Gait Assistance: Contact Guard/Touching assist Gait Distance (Feet): 75 Feet Assistive device: Rolling walker Gait Gait: Yes Gait Pattern: Impaired Gait Pattern: Decreased step length - left;Decreased step length - right;Shuffle Gait velocity: decreased Stairs / Additional Locomotion Stairs: Yes Stairs Assistance: Minimal Assistance - Patient > 75% Stair Management Technique: Alternating pattern(ascending, descending step-to pattern.) Number of Stairs: 12 Height of Stairs: 6 Wheelchair Mobility Wheelchair Mobility: Yes Wheelchair Assistance: Minimal assistance - Patient >75% Wheelchair Propulsion: Both upper extremities Wheelchair Parts Management: Needs assistance Distance: 50  Trunk/Postural Assessment  Cervical Assessment Cervical Assessment: Exceptions to WFL(forwrd head) Thoracic Assessment Thoracic Assessment: Exceptions to WFL(rounded shoulders.)  Balance Dynamic Standing Balance Dynamic Standing - Balance Support: Bilateral upper extremity supported Dynamic Standing - Level of Assistance: 4: Min assist Extremity Assessment      RLE Assessment RLE Assessment: Within Functional Limits LLE Assessment LLE Assessment: Within Functional Limits    Refer to Care Plan for Long Term Goals  Recommendations for other services: None   Discharge Criteria: Patient will be discharged from PT if patient refuses treatment 3 consecutive times without medical reason, if treatment goals not met, if there is a change in medical status, if patient makes no progress towards goals or if patient is discharged from hospital.  The above assessment, treatment plan, treatment alternatives and goals were discussed and mutually agreed upon: by patient  Ladoris Gene 03/02/2020, 12:19 PM

## 2020-03-02 NOTE — Progress Notes (Signed)
[name] PHYSICAL MEDICINE & REHABILITATION PROGRESS NOTE  Subjective/Complaints: Patient seen laying in bed this morning. He states he slept fairly overnight. He states people keep waking him up.  He wants to go home.   ROS: Denies CP, SOB, N/V/D  Objective: Vital Signs: Blood pressure 118/60, pulse 66, temperature 98 F (36.7 C), temperature source Axillary, resp. rate 18, SpO2 97 %. No results found. Recent Labs    03/01/20 1800 03/02/20 0604  WBC 6.7 6.6  HGB 15.3 15.1  HCT 47.2 46.5  PLT 239 216   Recent Labs    03/01/20 1800 03/02/20 0604  NA  --  138  K  --  3.6  CL  --  103  CO2  --  23  GLUCOSE  --  107*  BUN  --  18  CREATININE 1.13 1.12  CALCIUM  --  8.7*    Physical Exam: BP 118/60 (BP Location: Left Arm)   Pulse 66   Temp 98 F (36.7 C) (Axillary)   Resp 18   SpO2 97%  Constitutional: No distress . Vital signs reviewed. HENT: Normocephalic.  Atraumatic. HOH. Eyes: EOMI. No discharge. Cardiovascular: No JVD. Respiratory: Normal effort.  No stridor. GI: Non-distended. Skin: Warm and dry.  Intact. Psych: Irritable. Musc: No edema in extremities.  No tenderness in extremities. Neuro: Alert Motor: 5/5 throughout Right upper extremity: Mild dysmetria  Assessment/Plan: 1. Functional deficits secondary to acute right PCA infarct which require 3+ hours per day of interdisciplinary therapy in a comprehensive inpatient rehab setting.  Physiatrist is providing close team supervision and 24 hour management of active medical problems listed below.  Physiatrist and rehab team continue to assess barriers to discharge/monitor patient progress toward functional and medical goals  Care Tool:  Bathing  Bathing activity did not occur: Safety/medical concerns           Bathing assist       Upper Body Dressing/Undressing Upper body dressing   What is the patient wearing?: Hospital gown only    Upper body assist      Lower Body  Dressing/Undressing Lower body dressing      What is the patient wearing?: Hospital gown only     Lower body assist       Toileting Toileting Toileting Activity did not occur (Clothing management and hygiene only): N/A (no void or bm)  Toileting assist Assist for toileting: Minimal Assistance - Patient > 75%     Transfers Chair/bed transfer  Transfers assist  Chair/bed transfer activity did not occur: N/A        Locomotion Ambulation   Ambulation assist              Walk 10 feet activity   Assist           Walk 50 feet activity   Assist           Walk 150 feet activity   Assist           Walk 10 feet on uneven surface  activity   Assist           Wheelchair     Assist               Wheelchair 50 feet with 2 turns activity    Assist            Wheelchair 150 feet activity     Assist            Medical Problem List  and Plan: 1.  Deficits of dizziness secondary to right PCA acute infarct with severe stenosis of proximal mid right P2 segment, with markedly attenuated flow within the right PCA distally.  Moderate to severe proximal left P2 stenosis  Begin CIR evaluations 2.  Antithrombotics: -DVT/anticoagulation: Lovenox initiated 02/29/2020             -antiplatelet therapy: Aspirin 81 mg daily and Plavix 75 mg daily x3 months then aspirin alone 3. Pain Management: Tylenol as needed.   Monitor for headaches with increased emulation. 4. Mood: Provide emotional support             -antipsychotic agents: N/A 5. Neuropsych: This patient is capable of making decisions on his own behalf. 6. Skin/Wound Care: Routine skin checks 7. Fluids/Electrolytes/Nutrition: Routine in and outs.  BMP within acceptable range on 5/14 8.  Chronic systolic congestive heart failure.  Lasix 20 mg 3 times weekly Monday Wednesday Friday, Entresto 24-26 mg twice daily.  Monitor for any signs of fluid overload There were no  vitals filed for this visit.  Daily weights ordered. 9.  Hypertension.  Coreg 6.25 mg twice daily.    Monitor with increased mobility 10.  CAD/nonischemic cardiomyopathy.  Continue Coreg 11.  Diet-controlled diabetes mellitus.  Latest hemoglobin A1c 6.4.  Latest blood sugars 82-82-92.  CBGs discontinued  Monitor with increased mobility 12.  Hyperlipidemia.  Crestor 5mg . Do not increase dose given side effects of myalgias.  13.  GERD.  Nexium  LOS: 1 days A FACE TO FACE EVALUATION WAS PERFORMED  Christian [name] Christian [name] 03/02/2020, 9:38 AM

## 2020-03-02 NOTE — Evaluation (Addendum)
Occupational Therapy Assessment and Plan  Patient Details  Name: Christian [name] MRN: [id number] Date of Birth: [birth year]  OT Diagnosis: abnormal posture, ataxia, cognitive deficits, disturbance of vision and muscle weakness (generalized) Rehab Potential:   ELOS: 5-7 days   Today's Date: 03/02/2020 OT Individual Time: 1300-1400 OT Individual Time Calculation (min): 60 min     Problem List:  Patient Active Problem List   Diagnosis Date Noted  . Diabetes mellitus type 2 in nonobese (HCC)   . Chronic systolic congestive heart failure ([place])   . Acute ischemic right posterior cerebral artery (PCA) stroke ([place]) 03/01/2020  . Acute ischemic stroke ([place]) 02/26/2020  . Allergic rhinitis due to pollen 03/29/2018  . Oropharyngeal dysphagia 01/11/2018  . GERD with esophagitis 01/11/2018  . Claudication of both lower extremities ([place]) 01/06/2018  . Nonischemic cardiomyopathy ([place]) 11/11/2017  . Coronary artery disease involving native coronary artery of native heart without angina pectoris 11/11/2017  . Chronic systolic heart failure ([place])   . Essential hypertension 07/08/2017  . Depression with anxiety 07/08/2017  . Type 2 diabetes mellitus with complication, without long-term current use of insulin ([place]) 07/08/2017  . Hyperlipidemia LDL goal <130 05/25/2017    Past Medical History:  Past Medical History:  Diagnosis Date  . Allergic rhinitis due to pollen 03/29/2018  . Chronic systolic heart failure (HCC) - EF improved to normal on Rx    Echo 11/18: Mild LVH, EF 20, diffuse HK mild AI, a sending aorta 42 mm (mildly dilated), mild MR, mild TR, trivial PI, PASP 44 // Echo 3/19: mild LVH, EF 25-30, diff HK, Gr 1 DD, asc aorta 43 mm (mildly dilated), MAC, mild reduced RVSF // Limited echo 10/19: Mild concentric LVH, EF 21-97, grade 1 diastolic dysfunction, MAC, mild LAE   . Coronary artery disease involving native coronary artery of native heart without angina pectoris 11/11/2017   R/L HC 11/18:  pLAD 45, oD1 65 ; elevated PCWP (mean 29), moderate pulmonary hypertension, CO 3.66, CI 2.07  . Depression with anxiety 07/08/2017  . Essential hypertension 07/08/2017  . GERD with esophagitis 01/11/2018  . Hernia of abdominal cavity   . Hyperlipidemia   . Hyperlipidemia LDL goal <130 05/25/2017   The 10-year ASCVD risk score Mikey Bussing DC Jr., et al., 2013) is: 31.1%   Values used to calculate the score:     Age: 79 years     Sex: Male     Is Non-Hispanic African American: No     Diabetic: No     Tobacco smoker: No     Systolic Blood Pressure: 588 mmHg     Is BP treated: No     HDL Cholesterol: 68.7 mg/dL     Total Cholesterol: 258 mg/dL   . Idiopathic inflammatory myopathy 05/25/2017  . Neuropathy involving both lower extremities 05/25/2017  . Nonischemic cardiomyopathy ([place]) 11/11/2017   LHC 09/16/17-nonobstructive CAD  . Oropharyngeal dysphagia 01/11/2018  . Type 2 diabetes mellitus with complication, without long-term current use of insulin ([place]) 07/08/2017   Past Surgical History:  Past Surgical History:  Procedure Laterality Date  . BACK SURGERY    . HERNIA REPAIR    . RIGHT/LEFT HEART CATH AND CORONARY ANGIOGRAPHY N/A 09/16/2017   Procedure: RIGHT/LEFT HEART CATH AND CORONARY ANGIOGRAPHY;  Surgeon: Martinique, Peter M, MD;  Location: [facility];  Service: Cardiovascular;  Laterality: N/A;  . SPINE SURGERY      Assessment & Plan Clinical Impression: Christian [name] is an 79 year old right-handed male with history  of chronic systolic congestive heart failure maintained on Entresto, CAD/nonischemic cardiomyopathy maintained on aspirin, hypertension, diabetes mellitus. Per chart review lives alone. 1 level home 3 steps to entry. 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 He has an ex-wife who can assist on discharge. Presented 02/26/2020 with dizziness/lightheaded and increasing numbness bilateral lower extremities. MRI showed patchy acute infarct in the right PCA territory. Remote left occipital cortex infarct  and remote bilateral superficial occipital hemorrhage. CT angiogram head and neck negative for emergent large vessel occlusion. Echocardiogram with ejection fraction of 65% no wall motion abnormalities or emboli identified. Patient did not receive TPA. Admission chemistries unremarkable. A follow-up CT scan was completed 02/27/2020 showing stable right PCA territory infarct with a small petechial hemorrhage within the right parieto-occipital sulci just superior to the infarct bed. Currently maintained on low-dose aspirin for CVA as well as Plavix prophylaxis x3 months then aspirin alone. Patient was later placed on Lovenox for DVT prophylaxis 02/29/2020 tolerating a regular diet. Patient transferred to CIR on 03/01/2020 .    Patient currently requires min with basic self-care skills secondary to decreased cardiorespiratoy endurance, impaired timing and sequencing, unbalanced muscle activation, decreased coordination and decreased motor planning, hemianopsia and decreased attention, decreased awareness, decreased problem solving, decreased safety awareness, decreased memory and delayed processing.  Prior to hospitalization, patient could complete BADLs/IADLs with independence.  Patient will benefit from skilled intervention to decrease level of assist with basic self-care skills, increase independence with basic self-care skills and increase level of independence with iADL prior to discharge home with assistance from ex-wife.  Anticipate patient will require 24 hour supervision and follow up outpatient.  OT - End of Session Endurance Deficit: Yes   Skilled Therapeutic Intervention Patient met lying supine in bed in agreement with OT eval and treatment session with focus on self-care re-education, functional transfers, and functional mobility as detailed below. Purpose of rehab, purpose of OT, POC, ELOS, and goals discussed with patient in agreement. Supine to EOB and sit to stand from EOB to RW with  Min A and cueing for hand placement. Functional mobility to bathroom with Min A and cueing for step length and avoiding obstacles on L. Patient continent of bladder requiring Min A for toilet transfer on standard commode without DME over top. Shower transfer with Min A and cueing for sequencing. Patient completed bathing seated on shower chair for safety with Min A overall. Seated EOB, patient donned UB/LB clothing with Min A. Patient demonstrates episodes of emotional lability when discussing lack of family support as he never had children. Return to supine with supervision A. Session concluded with patient lying supine in bed with call bell within reach, bed alarm activated, and all needs met.   OT Evaluation Precautions/Restrictions  Precautions Precautions: Fall Precaution Comments: L hemianopsia Restrictions Weight Bearing Restrictions: No General Chart Reviewed: Yes Family/Caregiver Present: No Pain Pain Assessment Pain Scale: 0-10 Pain Score: 0-No pain Faces Pain Scale: No hurt Home Living/Prior Functioning Home Living Family/patient expects to be discharged to:: Private residence Living Arrangements: Alone(Anxiety/Tearful) Available Help at Discharge: Family, Available PRN/intermittently Type of Home: House Home Access: Stairs to enter Entrance Stairs-Number of Steps: 3 Entrance Stairs-Rails: Right(Pt cannot remember if has 2 rails.) Home Layout: One level Bathroom Shower/Tub: Walk-in shower Bathroom Toilet: Standard Bathroom Accessibility: Yes  Lives With: Alone Prior Function Level of Independence: Independent with transfers, Independent with gait  Able to Take Stairs?: Yes Driving: Yes Vocation: Retired Comments: Lives with 2 dogs Dolly and Molly. 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 Drives. ADL ADL Eating: Set up Where Assessed-Eating: Edge of bed Grooming: Setup Upper Body Bathing: Minimal assistance Where Assessed-Upper Body Bathing:  Shower Lower Body Bathing: Minimal assistance Where Assessed-Lower Body Bathing: Shower Upper Body Dressing: Minimal assistance Where Assessed-Upper Body Dressing: Edge of bed Lower Body Dressing: Minimal assistance Where Assessed-Lower Body Dressing: Edge of bed Toileting: Minimal assistance Where Assessed-Toileting: Toilet Toilet Transfer: Minimal assistance Toilet Transfer Method: Ambulating Toilet Transfer Equipment: Other (comment)(None) Walk-In Shower Transfer: Minimal assistance Walk-In Shower Transfer Method: Stand pivot, Ambulating Vision Baseline Vision/History: Wears glasses Wears Glasses: At all times Patient Visual Report: Peripheral vision impairment;Other (comment)(Hemianopsia, poor peripheral vision in L eye) Vision Assessment?: Yes Eye Alignment: Within Functional Limits Ocular Range of Motion: Restricted on the left Alignment/Gaze Preference: Within Defined Limits Tracking/Visual Pursuits: Decreased smoothness of horizontal tracking;Decreased smoothness of eye movement to LEFT superior field;Decreased smoothness of eye movement to LEFT inferior field;Requires cues, head turns, or add eye shifts to track Visual Fields: Left homonymous hemianopsia;Impaired-to be further tested in functional context Perception  Perception: Within Functional Limits Praxis Praxis: Intact Cognition Overall Cognitive Status: Impaired/Different from baseline Arousal/Alertness: Awake/alert Orientation Level: Person;Place;Situation Person: Oriented Place: Oriented Situation: Oriented Year: 2021 Month: May Day of Week: Correct Memory: Impaired Memory Impairment: Retrieval deficit;Decreased recall of new information Immediate Memory Recall: Sock;Blue;Bed Memory Recall Sock: Without Cue Memory Recall Blue: Without Cue Memory Recall Bed: Without Cue Attention: Selective Focused Attention: Appears intact Sustained Attention: Appears intact Selective Attention: Impaired Selective  Attention Impairment: Functional basic Awareness: Impaired Problem Solving: Impaired Problem Solving Impairment: Functional basic;Functional complex Sensation Sensation Light Touch: Appears Intact Coordination Gross Motor Movements are Fluid and Coordinated: No Heel Shin Test: decreased bilaterally. Motor  Motor Motor: Within Functional Limits;Hemiplegia Mobility  Bed Mobility Bed Mobility: Rolling Right;Sit to Supine;Supine to Sit Rolling Right: Supervision/verbal cueing Supine to Sit: Supervision/Verbal cueing(using side rails.) Sit to Supine: Supervision/Verbal cueing(using side rails.) Transfers Sit to Stand: Contact Guard/Touching assist Stand to Sit: Contact Guard/Touching assist  Trunk/Postural Assessment  Cervical Assessment Cervical Assessment: Exceptions to WFL(forwrd head) Thoracic Assessment Thoracic Assessment: Exceptions to WFL(rounded shoulders.)  Balance Dynamic Standing Balance Dynamic Standing - Balance Support: Bilateral upper extremity supported Dynamic Standing - Level of Assistance: 4: Min assist Extremity/Trunk Assessment RUE Assessment RUE Assessment: Within Functional Limits General Strength Comments: 4-/5 LUE Assessment LUE Assessment: Within Functional Limits General Strength Comments: 4-/5     Refer to Care Plan for Long Term Goals  Recommendations for other services: Neuropsych   Discharge Criteria: Patient will be discharged from OT if patient refuses treatment 3 consecutive times without medical reason, if treatment goals not met, if there is a change in medical status, if patient makes no progress towards goals or if patient is discharged from hospital.  The above assessment, treatment plan, treatment alternatives and goals were discussed and mutually agreed upon: by patient   R Howerton-Davis 03/02/2020, 12:36 PM  

## 2020-03-02 NOTE — Progress Notes (Signed)
Inpatient Rehabilitation Care Coordinator Assessment and Plan Inpatient Rehabilitation Care Coordinator Assessment and Plan  Patient Details  Name: Christian [name] MRN: [id number] Date of Birth: [birth year]  Today's Date: 03/02/2020  Problem List:  Patient Active Problem List   Diagnosis Date Noted  . Diabetes mellitus type 2 in nonobese (HCC)   . Chronic systolic congestive heart failure ([place])   . Acute ischemic right posterior cerebral artery (PCA) stroke ([place]) 03/01/2020  . Acute ischemic stroke ([place]) 02/26/2020  . Allergic rhinitis due to pollen 03/29/2018  . Oropharyngeal dysphagia 01/11/2018  . GERD with esophagitis 01/11/2018  . Claudication of both lower extremities ([place]) 01/06/2018  . Nonischemic cardiomyopathy ([place]) 11/11/2017  . Coronary artery disease involving native coronary artery of native heart without angina pectoris 11/11/2017  . Chronic systolic heart failure ([place])   . Essential hypertension 07/08/2017  . Depression with anxiety 07/08/2017  . Type 2 diabetes mellitus with complication, without long-term current use of insulin ([place]) 07/08/2017  . Hyperlipidemia LDL goal <130 05/25/2017   Past Medical History:  Past Medical History:  Diagnosis Date  . Allergic rhinitis due to pollen 03/29/2018  . Chronic systolic heart failure (HCC) - EF improved to normal on Rx    Echo 11/18: Mild LVH, EF 20, diffuse HK mild AI, a sending aorta 42 mm (mildly dilated), mild MR, mild TR, trivial PI, PASP 44 // Echo 3/19: mild LVH, EF 25-30, diff HK, Gr 1 DD, asc aorta 43 mm (mildly dilated), MAC, mild reduced RVSF // Limited echo 10/19: Mild concentric LVH, EF 98-33, grade 1 diastolic dysfunction, MAC, mild LAE   . Coronary artery disease involving native coronary artery of native heart without angina pectoris 11/11/2017   R/L HC 11/18: pLAD 45, oD1 65 ; elevated PCWP (mean 29), moderate pulmonary hypertension, CO 3.66, CI 2.07  . Depression with anxiety 07/08/2017  . Essential  hypertension 07/08/2017  . GERD with esophagitis 01/11/2018  . Hernia of abdominal cavity   . Hyperlipidemia   . Hyperlipidemia LDL goal <130 05/25/2017   The 10-year ASCVD risk score Mikey Bussing DC Jr., et al., 2013) is: 31.1%   Values used to calculate the score:     Age: 79 years     Sex: Male     Is Non-Hispanic African American: No     Diabetic: No     Tobacco smoker: No     Systolic Blood Pressure: 825 mmHg     Is BP treated: No     HDL Cholesterol: 68.7 mg/dL     Total Cholesterol: 258 mg/dL   . Idiopathic inflammatory myopathy 05/25/2017  . Neuropathy involving both lower extremities 05/25/2017  . Nonischemic cardiomyopathy ([place]) 11/11/2017   LHC 09/16/17-nonobstructive CAD  . Oropharyngeal dysphagia 01/11/2018  . Type 2 diabetes mellitus with complication, without long-term current use of insulin ([place]) 07/08/2017   Past Surgical History:  Past Surgical History:  Procedure Laterality Date  . BACK SURGERY    . HERNIA REPAIR    . RIGHT/LEFT HEART CATH AND CORONARY ANGIOGRAPHY N/A 09/16/2017   Procedure: RIGHT/LEFT HEART CATH AND CORONARY ANGIOGRAPHY;  Surgeon: Martinique, Peter M, MD;  Location: [facility];  Service: Cardiovascular;  Laterality: N/A;  . SPINE SURGERY     Social History:  reports that he has never smoked. He has never used smokeless tobacco. He reports that he does not drink alcohol or use drugs.  Family / Support Systems Children: no Other Supports: Sarah Anticipated Caregiver: [name]/Sarah Ability/Limitations of Caregiver: yes(Runs patients  kennel during day) Caregiver Availability: Intermittent  Social History Preferred language: English Religion:  Cultural Background: Owns boarding kennel Read: Yes Write: Yes Employment Status: Employed(Goal to return to work)   Abuse/Neglect Abuse/Neglect Assessment Can Be Completed: Yes Physical Abuse: Denies Verbal Abuse: Denies Sexual Abuse: Denies Exploitation of patient/patient's resources: Denies Self-Neglect:  Denies  Emotional Status Pt's affect, behavior and adjustment status: Patient misses pets. Upset this happened Recent Psychosocial Issues: no Psychiatric History: no Substance Abuse History: no  Patient / Family Perceptions, Expectations & Goals Pt/Family understanding of illness & functional limitations: yes Pt/family expectations/goals: Goal to discharge home [place]: None Transportation available at discharge: [employer] able to [employer] referrals recommended: Neuropsychology  Discharge Planning Living Arrangements: Alone(Anxiety/Tearful) Support Systems: Spouse/significant other, Friends/neighbors Type of Residence: Private residence(Front brick steps, no hand rails. 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 Patient pleasant, but tearful. Neuro referral made.   [name] 03/02/2020, 1:24 PM

## 2020-03-02 NOTE — Care Management (Signed)
Inpatient [facility] Individual Statement of Services  Patient Name:  Christian [name]  Date:  03/02/2020  Welcome to the [facility].  Our goal is to provide you with an individualized program based on your diagnosis and situation, designed to meet your specific needs.  With this comprehensive rehabilitation program, you will be expected to participate in at least 3 hours of rehabilitation therapies Monday-Friday, with modified therapy programming on the weekends.  Your rehabilitation program will include the following services:  Physical Therapy (PT), Occupational Therapy (OT), Speech Therapy (ST), 24 hour per day rehabilitation nursing, Therapeutic Recreaction (TR), Neuropsychology, Care Coordinator, Rehabilitation Medicine, Nutrition Services, Pharmacy Services and Other  Weekly team conferences will be held on Wednesdays to discuss your progress.  Your Inpatient Rehabilitation Care Coordinator will talk with you frequently to get your input and to update you on team discussions.  Team conferences with you and your family in attendance may also be held.  Expected length of stay: 12/14 Days  Overall anticipated outcome: MOD I  Depending on your progress and recovery, your program may change. Your Inpatient Rehabilitation Care Coordinator will coordinate services and will keep you informed of any changes. Your Inpatient Rehabilitation Care Coordinator's name and contact numbers are listed  below.  The following services may also be recommended but are not provided by the [facility]:    [facility] will be made to provide these services after discharge if needed.  Arrangements include referral to agencies that provide these services.  Your insurance has been verified to be:  [employer] Your primary doctor is:  Mertie Moores, MD  Pertinent information will be shared with  your doctor and your insurance company.  Inpatient Rehabilitation Care Coordinator:  Erlene Quan, [place] or [phone]  Information discussed with and copy given to patient by: [name], 03/02/2020, 12:09 PM

## 2020-03-03 ENCOUNTER — Inpatient Hospital Stay (HOSPITAL_COMMUNITY): Payer: Medicare Other | Admitting: Physical Therapy

## 2020-03-03 ENCOUNTER — Inpatient Hospital Stay (HOSPITAL_COMMUNITY): Payer: Medicare Other

## 2020-03-03 ENCOUNTER — Inpatient Hospital Stay (HOSPITAL_COMMUNITY): Payer: Medicare Other | Admitting: Speech Pathology

## 2020-03-03 MED ORDER — CARVEDILOL 6.25 MG PO TABS
6.2500 mg | ORAL_TABLET | Freq: Every day | ORAL | Status: DC
  Administered 2020-03-04: 6.25 mg via ORAL
  Filled 2020-03-03: qty 1

## 2020-03-03 NOTE — Progress Notes (Signed)
Occupational Therapy Session Note  Patient Details  Name: Christian [name] MRN: [id number] Date of Birth: 1941/05/29  Today's Date: 03/03/2020 OT Individual Time: 1100-1200 OT Individual Time Calculation (min): 60 min    Short Term Goals: Week 1:  OT Short Term Goal 1 (Week 1): LTG=STG 2/2 ELOS  Skilled Therapeutic Interventions/Progress Updates:  Pt received supine in bed agreeable to OT intervention. Session focus on dynamic balance tasks and functional mobility. Pt complete bed mobility with supervision and community distance functional mobility with RW and CGA. Pt complete dynamic balance tasks via dynavision. Pt complete 2 min trial with gross supervision with score of 21 and an avg reaction time of 5.71. Noted greater difficulty locating stimulus on L side. Pt completed second trial with only stimulus on L side with score 31 avg reaction time 3.87. Third trial completed with stimulus on R side with score of 38 and avg reaction time of 3.16. Remainder of session to focus on higher level functional mobility tasks such as functional stepping with RW as pt reports steps to enter home. Pt requried MOD cues for stair stepping pattern with RW. Pt completed IADL task where pt instructed to gather items around gym from various surface heights with RW. Pt complete task with gross supervision. Remainder of session to focus on functional mobility in ADL apartment where pt demo'ed safe transfers from recliner, sofa and bed with gross supervision and RW. Pt complete community distance functional mobility back to room with gross supervision. Pt required verbal cues and use of signs to locate room with increased time. Pt left supine in bed with bed alarm activated and all needs within reach.   Therapy Documentation Precautions:  Precautions Precautions: Fall Precaution Comments: L hemianopsia Restrictions Weight Bearing Restrictions: No General:   Vital Signs:   Pain: Pt reports no pain during session.    Therapy/Group: Individual Therapy  Ihor Gully 03/03/2020, 12:55 PM

## 2020-03-03 NOTE — Progress Notes (Signed)
[facility] PHYSICAL MEDICINE & REHABILITATION PROGRESS NOTE  Subjective/Complaints: Appears a little lonely,wants to talk a lot Hard of hearing Denies pain, constipation Sleeping poorly since woken at night.   ROS: Denies CP, SOB, N/V/D  Objective: Vital Signs: Blood pressure (!) 86/64, pulse 72, temperature 98 F (36.7 C), temperature source Oral, resp. rate 20, SpO2 97 %. No results found. Recent Labs    03/01/20 1800 03/02/20 0604  WBC 6.7 6.6  HGB 15.3 15.1  HCT 47.2 46.5  PLT 239 216   Recent Labs    03/01/20 1800 03/02/20 0604  NA  --  138  K  --  3.6  CL  --  103  CO2  --  23  GLUCOSE  --  107*  BUN  --  18  CREATININE 1.13 1.12  CALCIUM  --  8.7*    Physical Exam: BP (!) 86/64 (BP Location: Left Arm)   Pulse 72   Temp 98 F (36.7 C) (Oral)   Resp 20   SpO2 97%  Constitutional: No distress . Vital signs reviewed. HENT: Normocephalic.  Atraumatic. HOH. Eyes: EOMI. No discharge. Cardiovascular: No JVD. Respiratory: Normal effort.  No stridor. GI: Non-distended. Skin: Warm and dry.  Intact. Psych: Pleasant today Musc: No edema in extremities.  No tenderness in extremities. Neuro: Alert Motor: 5/5 throughout Right upper extremity: Mild dysmetria  Assessment/Plan: 1. Functional deficits secondary to acute right PCA infarct which require 3+ hours per day of interdisciplinary therapy in a comprehensive inpatient rehab setting.  Physiatrist is providing close team supervision and 24 hour management of active medical problems listed below.  Physiatrist and rehab team continue to assess barriers to discharge/monitor patient progress toward functional and medical goals  Care Tool:  Bathing  Bathing activity did not occur: Safety/medical concerns Body parts bathed by patient: Right arm, Left arm, Chest, Abdomen, Front perineal area, Buttocks, Right upper leg, Left upper leg, Right lower leg, Left lower leg, Face         Bathing assist Assist Level:  Minimal Assistance - Patient > 75%     Upper Body Dressing/Undressing Upper body dressing   What is the patient wearing?: Pull over shirt    Upper body assist Assist Level: Minimal Assistance - Patient > 75%    Lower Body Dressing/Undressing Lower body dressing      What is the patient wearing?: Pants     Lower body assist Assist for lower body dressing: Minimal Assistance - Patient > 75%     Toileting Toileting Toileting Activity did not occur (Clothing management and hygiene only): N/A (no void or bm)  Toileting assist Assist for toileting: Minimal Assistance - Patient > 75%     Transfers Chair/bed transfer  Transfers assist  Chair/bed transfer activity did not occur: N/A  Chair/bed transfer assist level: Contact Guard/Touching assist     Locomotion Ambulation   Ambulation assist      Assist level: Minimal Assistance - Patient > 75% Assistive device: Walker-rolling Max distance: 75   Walk 10 feet activity   Assist     Assist level: Minimal Assistance - Patient > 75% Assistive device: Walker-rolling   Walk 50 feet activity   Assist    Assist level: Minimal Assistance - Patient > 75% Assistive device: Walker-rolling    Walk 150 feet activity   Assist Walk 150 feet activity did not occur: Safety/medical concerns         Walk 10 feet on uneven surface  activity  Assist Walk 10 feet on uneven surfaces activity did not occur: Safety/medical concerns         Wheelchair     Assist Will patient use wheelchair at discharge?: No Type of Wheelchair: Manual    Wheelchair assist level: Contact Guard/Touching assist Max wheelchair distance: 50    Wheelchair 50 feet with 2 turns activity    Assist        Assist Level: Contact Guard/Touching assist   Wheelchair 150 feet activity     Assist Wheelchair 150 feet activity did not occur: Safety/medical concerns          Medical Problem List and Plan: 1.  Deficits  of dizziness secondary to right PCA acute infarct with severe stenosis of proximal mid right P2 segment, with markedly attenuated flow within the right PCA distally.  Moderate to severe proximal left P2 stenosis  Continue CIR  2.  Antithrombotics: -DVT/anticoagulation: Lovenox initiated 02/29/2020             -antiplatelet therapy: Aspirin 81 mg daily and Plavix 75 mg daily x3 months then aspirin alone 3. Pain Management: Tylenol as needed. Well controlled  Monitor for headaches with increased emulation. 4. Mood: Provide emotional support             -antipsychotic agents: N/A 5. Neuropsych: This patient is capable of making decisions on his own behalf. 6. Skin/Wound Care: Routine skin checks 7. Fluids/Electrolytes/Nutrition: Routine in and outs.  BMP within acceptable range on 5/14 8.  Chronic systolic congestive heart failure.  Lasix 20 mg 3 times weekly Monday Wednesday Friday, Entresto 24-26 mg twice daily.  Monitor for any signs of fluid overload There were no vitals filed for this visit.  Daily weights ordered. 9.  Hypertension.  Coreg 6.25 mg twice daily.    5/15: decreased to daily as SBP in 80s  Monitor with increased mobility 10.  CAD/nonischemic cardiomyopathy.  Continue Coreg 11.  Diet-controlled diabetes mellitus.  Latest hemoglobin A1c 6.4.  Latest blood sugars 82-82-92.  CBGs discontinued  Monitor with increased mobility 12.  Hyperlipidemia.  Crestor 5mg . Do not increase dose given side effects of myalgias.  13.  GERD.  Nexium  CODE STATUS: Patient requests DNR; placed order on 5/15  LOS: 2 days A FACE TO FACE EVALUATION WAS [place] Greb 03/03/2020, 3:08 PM

## 2020-03-03 NOTE — Progress Notes (Addendum)
Speech Language Pathology Daily Session Note  Patient Details  Name: Christian [name] MRN: [id number] Date of Birth: July 16, 1941  Today's Date: 03/03/2020 SLP Individual Time: 1000-1030 SLP Individual Time Calculation (min): 30 min  Short Term Goals: Week 1: SLP Short Term Goal 1 (Week 1): STG=LTG due to ELOS  Skilled Therapeutic Interventions:  Pt was seen for skilled ST targeting cognitive goals.  SLP facilitated the session with medication management tasks to address goals for memory.  Pt was able to recall function and frequency of medications when named with ~80% accuracy with mod I.  Medications that pt was less familiar with were new medications.  After several repetitions of information, pt was also able to recall his therapy schedule for upcoming therapy appointments with supervision, although he had difficulty retaining what day of the week it was, despite multiple repetitions.  Pt often asks the same questions repeatedly but does appear to eventually to retain the information and will stop asking a particular question once he appears to feel more confident in his recall of the answer.   Overall, pt's mentation was much clearer today and affect was much brighter than yesterday's evaluation, where he was at times labile.  Pt was left in bed with bed alarm set and call bell within reach.  Continue per current plan of care.    Pain Pain Assessment Pain Scale: 0-10 Pain Score: 0-No pain  Therapy/Group: Individual Therapy  Jeniel Slauson, Selinda Orion 03/03/2020, 12:50 PM

## 2020-03-03 NOTE — Progress Notes (Signed)
Physical Therapy Session Note  Patient Details  Name: Christian [name] MRN: [id number] Date of Birth: [birth year]  Today's Date: 03/03/2020 PT Individual Time: [id number] and [id number] PT Individual Time Calculation (min): 45 min and 71 min   Short Term Goals: Week 1:  PT Short Term Goal 1 (Week 1): STG+LTGs due to LOS.  Skilled Therapeutic Interventions/Progress Updates:    Session 1: Pt received L sidelying in bed and agreeable to therapy session. Sidelying>sitting L EOB, HOB flat but relying on bedrails, with supervision and increased time for trunk upright. Stand pivot to w/c, no AD, with min assist.  Transported to/from gym in w/c for time management and energy conservation. Gait training ~11ft using RW with CGA for steadying - demonstrates decreased gait speed with decreased B LE step lengths and forward trunk flexion. Transitioned to gait training, no AD, ~31ft x2 with R HHA and min assist for balance - demonstrates same gait impairments as above but with improved trunk extension. Lateral side stepping in // bars with B UE support used as needed with max cuing for increased length of steps and increased weight shifting. Repeated sit<>stands to/from EOM, no UE support, with 2" step under R LE to promote increased L LE weight shift 2sets to fatigue ~10-15reps each. Stand pivot to w/c, no AD, with CGA/min assist for balance. Transported back to room and pt requesting to return to bed for a nap until next session. Ambulatory transfer, no AD, ~31ft with CGA/min assist. Sit>supine supervision. Pt left supine in bed with needs in reach and bed alarm on.   Session 2: Pt received supine, asleep in bed but easily awakens and agreeable to therapy session. Supine>sitting L EOB with HOB partially elevated and using bedrails with supervision. Gait training ~168ft to main therapy gym with R HHA and min assist for balance - continues to demonstrate decreased L weight shift during stance and decreased L stance time -  decreased B LE step lengths and wide BOS. Gait ~70ft x2 to/from stairs with L HHA and min assist for balance - continues to demonstrate above gait impairments. Ascended/descended 12 steps using B HRs with reciprocal pattern and min assist for balance. R lateral foot taps on 6" step with L HHA progressed to no HHA targeting L weight shift and weight bearing with light min assist 2 sets 15 reps - repeated with L LE foot taps 2 sets 15 reps and min assist. Dynamic standing balance task of L lateral reaching to low surface to grasp horseshoe then toss towards target with CGA/min assist for balance throughout. Standing balance task narrow BOS 2x30seconds then normal BOS eyes closed 2x15seconds then semi-tandem stance 2x30 seconds. Standing with CGA for safety donned maxi-sky harness. Gait training ~15-58ft down/back in harness for safety while stepping forward through agility ladder 1 foot per square targeting increased step length L HHA and CGA for safety. Gait ~66ft backwards while in maxisky harness with L HHA and CGA for safety. Gait training ~158ft back to room using L HHA with min assist for balance - continued cuing for reciprocal pattern, increased L stance time and increased step lengths. Pt left seated in w/c with needs in reach, seat belt alarm on, and meal tray set-up.  Therapy Documentation Precautions:  Precautions Precautions: Fall Precaution Comments: L hemianopsia Restrictions Weight Bearing Restrictions: No  Pain: Session 1: Denies pain during session.  Session 2: Reports some muscle soreness during therapy tasks - provided seated rest breaks.   Therapy/Group: Individual Therapy  [place],  PT, DPT 03/03/2020, 2:32 PM

## 2020-03-03 NOTE — Plan of Care (Signed)
  Problem: RH SAFETY Goal: RH STG ADHERE TO SAFETY PRECAUTIONS W/ASSISTANCE/DEVICE Description: STG Adhere to Safety Precautions With cues/reminders Assistance/Device. 03/03/2020 1014 by Ander Slade, RN Outcome: Not Progressing; patient is confused ; bed alarm settings    Problem: RH KNOWLEDGE DEFICIT Goal: RH STG INCREASE KNOWLEDGE OF HYPERTENSION Description: Pt will be able to demonstrate understanding of HTN management with medication compliance and diet control using handouts/booklets with mod I assist prior to DC 03/03/2020 1014 by Ander Slade, RN Outcome: Not Progressing; confused 03/03/2020 1013 by Ander Slade, RN Note: Moderate assistance

## 2020-03-04 MED ORDER — CARVEDILOL 3.125 MG PO TABS
3.1250 mg | ORAL_TABLET | Freq: Every day | ORAL | Status: DC
  Administered 2020-03-05 – 2020-03-08 (×4): 3.125 mg via ORAL
  Filled 2020-03-04 (×4): qty 1

## 2020-03-04 NOTE — Progress Notes (Signed)
[facility] PHYSICAL MEDICINE & REHABILITATION PROGRESS NOTE  Subjective/Complaints: No complaints. Prefers to go home earlier if possible. Ambulating >150 feet; Lovenox d/ced.   ROS: Denies CP, SOB, N/V/D  Objective: Vital Signs: Blood pressure 125/75, pulse 73, temperature 97.8 F (36.6 C), resp. rate 18, SpO2 98 %. No results found. Recent Labs    03/01/20 1800 03/02/20 0604  WBC 6.7 6.6  HGB 15.3 15.1  HCT 47.2 46.5  PLT 239 216   Recent Labs    03/01/20 1800 03/02/20 0604  NA  --  138  K  --  3.6  CL  --  103  CO2  --  23  GLUCOSE  --  107*  BUN  --  18  CREATININE 1.13 1.12  CALCIUM  --  8.7*    Physical Exam: BP 125/75 (BP Location: Right Arm)   Pulse 73   Temp 97.8 F (36.6 C)   Resp 18   SpO2 98%  Constitutional: No distress . Vital signs reviewed. Lying in bed comfortably HENT: Normocephalic.  Atraumatic. HOH. Eyes: EOMI. No discharge. Cardiovascular: No JVD. Respiratory: Normal effort.  No stridor. GI: Non-distended. Skin: Warm and dry.  Intact. Psych: Pleasant  Musc: No edema in extremities.  No tenderness in extremities. Neuro: Alert Motor: 5/5 throughout Right upper extremity: Mild dysmetria  Assessment/Plan: 1. Functional deficits secondary to acute right PCA infarct which require 3+ hours per day of interdisciplinary therapy in a comprehensive inpatient rehab setting.  Physiatrist is providing close team supervision and 24 hour management of active medical problems listed below.  Physiatrist and rehab team continue to assess barriers to discharge/monitor patient progress toward functional and medical goals  Care Tool:  Bathing  Bathing activity did not occur: Safety/medical concerns Body parts bathed by patient: Right arm, Left arm, Chest, Abdomen, Front perineal area, Buttocks, Right upper leg, Left upper leg, Right lower leg, Left lower leg, Face         Bathing assist Assist Level: Minimal Assistance - Patient > 75%      Upper Body Dressing/Undressing Upper body dressing   What is the patient wearing?: Pull over shirt    Upper body assist Assist Level: Minimal Assistance - Patient > 75%    Lower Body Dressing/Undressing Lower body dressing      What is the patient wearing?: Pants     Lower body assist Assist for lower body dressing: Minimal Assistance - Patient > 75%     Toileting Toileting Toileting Activity did not occur (Clothing management and hygiene only): N/A (no void or bm)  Toileting assist Assist for toileting: Independent with assistive device Assistive Device Comment: urinal   Transfers Chair/bed transfer  Transfers assist  Chair/bed transfer activity did not occur: N/A  Chair/bed transfer assist level: Minimal Assistance - Patient > 75%     Locomotion Ambulation   Ambulation assist      Assist level: Minimal Assistance - Patient > 75% Assistive device: Hand held assist Max distance: 156ft   Walk 10 feet activity   Assist     Assist level: Minimal Assistance - Patient > 75% Assistive device: Hand held assist   Walk 50 feet activity   Assist    Assist level: Minimal Assistance - Patient > 75% Assistive device: Hand held assist    Walk 150 feet activity   Assist Walk 150 feet activity did not occur: Safety/medical concerns  Assist level: Minimal Assistance - Patient > 75% Assistive device: Hand held assist  Walk 10 feet on uneven surface  activity   Assist Walk 10 feet on uneven surfaces activity did not occur: Safety/medical concerns         Wheelchair     Assist Will patient use wheelchair at discharge?: No Type of Wheelchair: Manual    Wheelchair assist level: Contact Guard/Touching assist Max wheelchair distance: 50    Wheelchair 50 feet with 2 turns activity    Assist        Assist Level: Contact Guard/Touching assist   Wheelchair 150 feet activity     Assist Wheelchair 150 feet activity did not occur:  Safety/medical concerns          Medical Problem List and Plan: 1.  Deficits of dizziness secondary to right PCA acute infarct with severe stenosis of proximal mid right P2 segment, with markedly attenuated flow within the right PCA distally.  Moderate to severe proximal left P2 stenosis  Continue CIR  2.  Antithrombotics: -DVT/anticoagulation: Lovenox initiated 02/29/2020             -antiplatelet therapy: Aspirin 81 mg daily and Plavix 75 mg daily x3 months then aspirin alone 3. Pain Management: Tylenol as needed. Well controlled  Monitor for headaches with increased emulation. 4. Mood: Provide emotional support             -antipsychotic agents: N/A 5. Neuropsych: This patient is capable of making decisions on his own behalf. 6. Skin/Wound Care: Routine skin checks 7. Fluids/Electrolytes/Nutrition: Routine in and outs.  BMP within acceptable range on 5/14 8.  Chronic systolic congestive heart failure.  Lasix 20 mg 3 times weekly Monday Wednesday Friday, Entresto 24-26 mg twice daily.  Monitor for any signs of fluid overload There were no vitals filed for this visit.  Daily weights ordered. 9.  Hypertension.  Coreg 6.25 mg twice daily.    5/16: decrease to 3.125 daily  Monitor with increased mobility 10.  CAD/nonischemic cardiomyopathy.  Continue Coreg 11.  Diet-controlled diabetes mellitus.  Latest hemoglobin A1c 6.4.  Latest blood sugars 82-82-92.  CBGs discontinued  Monitor with increased mobility 12.  Hyperlipidemia.  Crestor 5mg . Do not increase dose given side effects of myalgias.  13.  GERD.  Nexium. Finds this to be helpful.   CODE STATUS: Patient requests DNR; placed order on 5/15  LOS: 3 days A FACE TO [place] Dontaye Hur 03/04/2020, 4:46 PM

## 2020-03-05 ENCOUNTER — Inpatient Hospital Stay (HOSPITAL_COMMUNITY): Payer: Medicare Other

## 2020-03-05 ENCOUNTER — Inpatient Hospital Stay (HOSPITAL_COMMUNITY): Payer: Medicare Other | Admitting: Physical Therapy

## 2020-03-05 ENCOUNTER — Encounter (HOSPITAL_COMMUNITY): Payer: Medicare Other | Admitting: Psychology

## 2020-03-05 DIAGNOSIS — R109 Unspecified abdominal pain: Secondary | ICD-10-CM

## 2020-03-05 DIAGNOSIS — I10 Essential (primary) hypertension: Secondary | ICD-10-CM

## 2020-03-05 MED ORDER — ONDANSETRON HCL 4 MG PO TABS
4.0000 mg | ORAL_TABLET | Freq: Three times a day (TID) | ORAL | Status: DC | PRN
  Administered 2020-03-05: 4 mg via ORAL

## 2020-03-05 MED ORDER — SIMETHICONE 80 MG PO CHEW
80.0000 mg | CHEWABLE_TABLET | Freq: Four times a day (QID) | ORAL | Status: DC | PRN
  Administered 2020-03-05 – 2020-03-07 (×3): 80 mg via ORAL
  Filled 2020-03-05 (×3): qty 1

## 2020-03-05 NOTE — Plan of Care (Signed)
  Problem: Consults Goal: RH STROKE PATIENT EDUCATION Description: See Patient Education module for education specifics  Outcome: Progressing   Problem: RH SAFETY Goal: RH STG ADHERE TO SAFETY PRECAUTIONS W/ASSISTANCE/DEVICE Description: STG Adhere to Safety Precautions With cues/reminders Assistance/Device. Outcome: Progressing   Problem: RH KNOWLEDGE DEFICIT Goal: RH STG INCREASE KNOWLEDGE OF HYPERTENSION Description: Pt will be able to demonstrate understanding of HTN management with medication compliance and diet control using handouts/booklets with mod I assist prior to DC Outcome: Progressing Goal: RH STG INCREASE KNOWLEGDE OF HYPERLIPIDEMIA Description: Pt will be able to demonstrate understanding of HLD management with medication compliance and diet control using handouts/booklets with mod I assist prior to DC Outcome: Progressing Goal: RH STG INCREASE KNOWLEDGE OF STROKE PROPHYLAXIS Description: Pt will be able to demonstrate understanding of stroke prevention with medication compliance and diet control using handouts/booklets with mod I assist prior to DC Outcome: Progressing   Problem: RH Vision Goal: RH LTG Vision (Specify) Outcome: Progressing

## 2020-03-05 NOTE — Progress Notes (Signed)
Patient complained of upset stomach/nausea. Linna Hoff, PA made aware. New order for Zofran 4mg  PRN Q8. PRN medication given to patient.
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
## 2020-03-05 NOTE — Progress Notes (Signed)
Physical Therapy Session Note  Patient Details  Name: Christian [name] MRN: [id number] Date of Birth: 1941/08/26  Today's Date: 03/05/2020 PT Individual Time: 8592-7639 PT Individual Time Calculation (min): 60 min   Short Term Goals: Week 1:  PT Short Term Goal 1 (Week 1): STG+LTGs due to LOS.  Skilled Therapeutic Interventions/Progress Updates:    Patient received in bed, pleasant and willing to participate in PT this morning. Able to complete all bed mobility with S, then only required Min guard for all functional transfers and gait multiple bouts up to 253f without assistive device today. Did need cues for safe gait speed and safety with ambulation but with no significant balance loss during session today. Tolerated riding Nustep approximately 10 minutes on resistance 3 with BLEs only, otherwise focused on gait training and balance training including navigation of obstacle course (tandem gait, weaving between cones, stepping up/off step, and over hockey stick) and SLS practice. Able to toilet with distant S, however unable to have BM. [facility], all other needs met this morning.   Therapy Documentation Precautions:  Precautions Precautions: Fall Precaution Comments: L hemianopsia Restrictions Weight Bearing Restrictions: No Pain: Pain Assessment Pain Scale: 0-10 Pain Score: 0-No pain Faces Pain Scale: Hurts a little bit Pain Type: Acute pain Pain Location: Abdomen Pain Orientation: Mid;Lower Pain Descriptors / Indicators: Cramping Pain Onset: On-going Patients Stated Pain Goal: 0 Pain Intervention(s): Repositioned;Ambulation/increased activity Multiple Pain Sites: No    Therapy/Group: Individual Therapy   KWindell Norfolk DPT, PN1   Supplemental Physical Therapist [place]   Pager [phone] Rehab Office [phone]   03/05/2020, 12:45 PM

## 2020-03-05 NOTE — Progress Notes (Addendum)
Physical Therapy Session Note  Patient Details  Name: Christian [name] MRN: [id number] Date of Birth: November 29, 1940  Today's Date: 03/05/2020 PT Individual Time: 1302-1359 PT Individual Time Calculation (min): 57 min   Short Term Goals: Week 1:  PT Short Term Goal 1 (Week 1): STG+LTGs due to LOS.  Skilled Therapeutic Interventions/Progress Updates:    Patient received up in [facility], pleasant but states he did not eat lunch- "I don't like the hospital food!"; educated that he needs to eat to keep bowels moving and assist in healing. Able to complete all functional transfers with min guard today, and focused afternoon session primarily on balance including tandem stance with one foot on step with head turns, tandem stance on blue foam pad with head turns, heel raises on blue foam, standing marches on blue foam, cone taps, sidestepping over obstacles, and practicing side stepping as a whole. Also continued gait training multiple distances of approximately 259f with no device and S-min guard and continued practicing steps- able to navigate 24 steps with R ascending and descending rail with min guard and cues to make sure feet were completely on steps today. Left in bed with all needs met, bed alarm active. Progressing well.   Therapy Documentation Precautions:  Precautions Precautions: Fall Precaution Comments: L hemianopsia Restrictions Weight Bearing Restrictions: No Pain: Pain Assessment Pain Scale: 0-10 Pain Score: 0-No pain Faces Pain Scale: Hurts a little bit Pain Type: Acute pain Pain Location: Abdomen Pain Orientation: Mid;Lower Pain Descriptors / Indicators: Cramping Pain Onset: On-going Patients Stated Pain Goal: 0 Pain Intervention(s): Repositioned;Ambulation/increased activity Multiple Pain Sites: No    Therapy/Group: Individual Therapy   KWindell Norfolk DPT, PN1   Supplemental Physical Therapist [place]   Pager [phone] Rehab Office  [phone]   03/05/2020, 3:37 PM

## 2020-03-05 NOTE — Progress Notes (Signed)
Speech Language Pathology Daily Session Note  Patient Details  Name: Christian [name] MRN: [id number] Date of Birth: 1941/03/28  Today's Date: 03/05/2020 SLP Individual Time: [id number] SLP Individual Time Calculation (min): 57 min  Short Term Goals: Week 1: SLP Short Term Goal 1 (Week 1): STG=LTG due to ELOS  Skilled Therapeutic Interventions: Skilled ST services focused on cognitive skills.  Pt demonstrated recall of general medication and medication system completed prior to admission. Pt's medication system appeared appropriate and thorough. SLP facilitated recall and mildly complex problem solving skills in novel medication task with BID pill organizer. Pt required max A verbal cues to complete task, however Mod I for verbal problem solving pertaining to medication. Pt demonstrated recall of medication name, function and times per day with supervision A verbal cues for use of visual aid. SLP recommends pt continued use of previous medication system, due to increase anxiety expressed by pt with novel system. Pt was left in room with call bell within reach and bed alarm set. ST recommends to continue skilled ST services.      Pain Pain Assessment Pain Scale: 0-10 Pain Score: 0-No pain Faces Pain Scale: Hurts a little bit Pain Type: Acute pain Pain Location: Abdomen Pain Orientation: Mid;Lower Pain Descriptors / Indicators: Cramping Pain Onset: On-going Patients Stated Pain Goal: 0 Pain Intervention(s): Repositioned;Ambulation/increased activity Multiple Pain Sites: No  Therapy/Group: Individual Therapy  [place]  [facility] 03/05/2020, 3:48 PM

## 2020-03-05 NOTE — Progress Notes (Signed)
[place] PHYSICAL MEDICINE & REHABILITATION PROGRESS NOTE  Subjective/Complaints: Patient seen laying in bed this AM.  He states he slept well overnight.  He notes stomach cramps this AM that improved, but still persistent, after having a BM this AM.    ROS: +Stomach cramps. Denies CP, SOB, N/V/D  Objective: Vital Signs: Blood pressure 102/83, pulse 75, temperature 97.8 F (36.6 C), temperature source Oral, resp. rate 18, SpO2 100 %. No results found. No results for input(s): WBC, HGB, HCT, PLT in the last 72 hours. No results for input(s): NA, K, CL, CO2, GLUCOSE, BUN, CREATININE, CALCIUM in the last 72 hours.  Physical Exam: BP 102/83   Pulse 75   Temp 97.8 F (36.6 C) (Oral)   Resp 18   SpO2 100%   Constitutional: No distress . Vital signs reviewed. HENT: Normocephalic.  Atraumatic. HOH. Eyes: EOMI. No discharge. Cardiovascular: No JVD. Respiratory: Normal effort.  No stridor. GI: Non-distended. Skin: Warm and dry.  Intact. Psych: Normal mood.  Normal behavior. Musc: No edema in extremities.  No tenderness in extremities. Neuro: Alert Motor: 5/5 throughout, unchanged Right upper extremity: Mild dysmetria  Assessment/Plan: 1. Functional deficits secondary to acute right PCA infarct which require 3+ hours per day of interdisciplinary therapy in a comprehensive inpatient rehab setting.  Physiatrist is providing close team supervision and 24 hour management of active medical problems listed below.  Physiatrist and rehab team continue to assess barriers to discharge/monitor patient progress toward functional and medical goals  Care Tool:  Bathing  Bathing activity did not occur: Safety/medical concerns Body parts bathed by patient: Right arm, Left arm, Chest, Abdomen, Front perineal area, Buttocks, Right upper leg, Left upper leg, Right lower leg, Left lower leg, Face         Bathing assist Assist Level: Minimal Assistance - Patient > 75%     Upper Body  Dressing/Undressing Upper body dressing   What is the patient wearing?: Pull over shirt    Upper body assist Assist Level: Minimal Assistance - Patient > 75%    Lower Body Dressing/Undressing Lower body dressing      What is the patient wearing?: Pants     Lower body assist Assist for lower body dressing: Minimal Assistance - Patient > 75%     Toileting Toileting Toileting Activity did not occur (Clothing management and hygiene only): N/A (no void or bm)  Toileting assist Assist for toileting: Independent with assistive device Assistive Device Comment: urinal   Transfers Chair/bed transfer  Transfers assist  Chair/bed transfer activity did not occur: N/A  Chair/bed transfer assist level: Minimal Assistance - Patient > 75%     Locomotion Ambulation   Ambulation assist      Assist level: Minimal Assistance - Patient > 75% Assistive device: Hand held assist Max distance: 149ft   Walk 10 feet activity   Assist     Assist level: Minimal Assistance - Patient > 75% Assistive device: Hand held assist   Walk 50 feet activity   Assist    Assist level: Minimal Assistance - Patient > 75% Assistive device: Hand held assist    Walk 150 feet activity   Assist Walk 150 feet activity did not occur: Safety/medical concerns  Assist level: Minimal Assistance - Patient > 75% Assistive device: Hand held assist    Walk 10 feet on uneven surface  activity   Assist Walk 10 feet on uneven surfaces activity did not occur: Safety/medical concerns         Wheelchair  Assist Will patient use wheelchair at discharge?: No Type of Wheelchair: Manual    Wheelchair assist level: Contact Guard/Touching assist Max wheelchair distance: 50    Wheelchair 50 feet with 2 turns activity    Assist        Assist Level: Contact Guard/Touching assist   Wheelchair 150 feet activity     Assist Wheelchair 150 feet activity did not occur: Safety/medical  concerns          Medical Problem List and Plan: 1.  Deficits of dizziness secondary to right PCA acute infarct with severe stenosis of proximal mid right P2 segment, with markedly attenuated flow within the right PCA distally.  Moderate to severe proximal left P2 stenosis  Continue CIR  2.  Antithrombotics: -DVT/anticoagulation: Lovenox initiated 02/29/2020             -antiplatelet therapy: Aspirin 81 mg daily and Plavix 75 mg daily x3 months then aspirin alone 3. Pain Management: Tylenol as needed.  Monitor for headaches with increased exertion.  Controlled on 5/17 4. Mood: Provide emotional support             -antipsychotic agents: N/A 5. Neuropsych: This patient is capable of making decisions on his own behalf. 6. Skin/Wound Care: Routine skin checks 7. Fluids/Electrolytes/Nutrition: Routine in and outs.  BMP within acceptable range on 5/14, labs ordered for tomorrow 8.  Chronic systolic congestive heart failure.  Lasix 20 mg 3 times weekly Monday Wednesday Friday, Entresto 24-26 mg twice daily.  Monitor for any signs of fluid overload There were no vitals filed for this visit.  Daily weights ordered. 9.  Hypertension.    Coreg 6.25 mg twice daily, decreased to 3.125 daily  Controlled on 5/17  Monitor with increased mobility 10.  CAD/nonischemic cardiomyopathy.  Continue Coreg 11.  Diet-controlled diabetes mellitus.  Latest hemoglobin A1c 6.4.    Controlled, CBGs d/ced  Monitor with increased mobility 12.  Hyperlipidemia.  Crestor 5mg . Do not increase dose given side effects of myalgias.  13.  GERD.  Nexium with benefit 14. Abdominal cramps  Simethicone started on 5/17  LOS: 4 days A FACE TO FACE EVALUATION WAS PERFORMED  Ankit Lorie Phenix 03/05/2020, 10:19 AM

## 2020-03-06 ENCOUNTER — Inpatient Hospital Stay (HOSPITAL_COMMUNITY): Payer: Medicare Other | Admitting: Physical Therapy

## 2020-03-06 ENCOUNTER — Inpatient Hospital Stay (HOSPITAL_COMMUNITY): Payer: Medicare Other | Admitting: Speech Pathology

## 2020-03-06 ENCOUNTER — Inpatient Hospital Stay (HOSPITAL_COMMUNITY): Payer: Medicare Other | Admitting: Occupational Therapy

## 2020-03-06 LAB — BASIC METABOLIC PANEL
Anion gap: 12 (ref 5–15)
BUN: 16 mg/dL (ref 8–23)
CO2: 27 mmol/L (ref 22–32)
Calcium: 9.2 mg/dL (ref 8.9–10.3)
Chloride: 100 mmol/L (ref 98–111)
Creatinine, Ser: 1.2 mg/dL (ref 0.61–1.24)
GFR calc Af Amer: >60 mL/min (ref >60)
GFR calc non Af Amer: 57 mL/min — ABNORMAL LOW (ref >60)
Glucose, Bld: 140 mg/dL — ABNORMAL HIGH (ref 70–99)
Potassium: 4.4 mmol/L (ref 3.5–5.1)
Sodium: 139 mmol/L (ref 135–145)

## 2020-03-06 NOTE — Progress Notes (Signed)
Received a call from patient's wife stating that patient wanted her to come & sign him out. Patient stated that his bottom was hurting & he wanted to get up, staff would not let him do anything. Wife stated that she was going to call him back because it was after visiting hours & this nurse went into the room to speak with patient. Staff alerted nurse along the way that patient was upset & wanted to leave. When this nurse arrived to the room, patient was on the telephone with his wife. He already seemed to be a little calmer from what staff & family expressed. When he got off the phone, he apologized & was very pleasant. He stated that it had been a long time & his butt was hurting from laying in the bed, he only wanted to get up. Explained to the patient how therapy let's nursing know how safely to move him & how much precautions should be in place. He verbalized how much he has been doing in therapy & wanted to know when they'll decide on when he goes home. Educated on team conference day, Wednesday 7 possibility of discharge if the team decides that he is strong enough to go home. Patient is eager to go home, but verbalizes understanding. He is in his wheelchair with safety belt on. He verbalizes understanding of using the call light when he wants to get up. No acute distress noted. Call bell & other personal items are within reach. Will continue to monitor.

## 2020-03-06 NOTE — Progress Notes (Signed)
Speech Language Pathology Daily Session Note  Patient Details  Name: Shyquan Shumard MRN: [id number] Date of Birth: 17-Jul-1941  Today's Date: 03/06/2020 SLP Individual Time: 1100-1140 SLP Individual Time Calculation (min): 40 min  Short Term Goals: Week 1: SLP Short Term Goal 1 (Week 1): STG=LTG due to ELOS  Skilled Therapeutic Interventions: Skilled treatment session focused on cognitive goals. SLP facilitated session by providing extra time and Mod A verbal cues for problem solving and recall during a basic money management task. Function with task was also impacted by visual acuity and poor positioning throughout. SLP cued patient multiple times for repositioning to maximize function, however, patient declined. Mod A verbal cues were also utilized for sustained attention to task due to verbosity. Patient intermittently tearful throughout session when discussing his animals. SLP provided support and encouragement. Patient left upright in bed with alarm on and all needs within reach. Continue with current plan of care.      Pain No/Denies Pain   Therapy/Group: Individual Therapy  Tanecia Mccay 03/06/2020, 11:43 AM

## 2020-03-06 NOTE — Progress Notes (Signed)
Physical Therapy Session Note  Patient Details  Name: Christian [name] MRN: [id number] Date of Birth: 10/18/1941  Today's Date: 03/06/2020 PT Individual Time:  -      Short Term Goals: Week 1:  PT Short Term Goal 1 (Week 1): STG+LTGs due to LOS.  Skilled Therapeutic Interventions/Progress Updates:   Pt in supine and agreeable to therapy, no c/o pain. Supervision bed mobility. Pt ambulated to/from therapy gym and around unit w/o AD, CGA-close supervision w/ no overt LOB. Ambulated >1000' in total throughout session to work on gait training, verbal and visual cues to increase step length w/ goal of improving fluidity of gait. Pt ambulates w/ short step length bilaterally and shuffling/wide-based gait w/ excessive lateral trunk lean. Added in challenge of ambulating around cones x2 reps w/ CGA, verbal cues again to increase step length, tactile cues for upright posture. Worked on postural control and dynamic balance while pinning/unpinning clothespins to basketball hoop. Performed both on firm and foam surface w/ progressively lower BOS, CGA-min assist. Reaching required lateral weight shifting, squatting, and trunk rotation. Performed multiple bouts of standing. Blocked practice of sit<>stands on foam surface w/ emphasis on stabilizing w/o external aid, CGA assist w/ encouragement to not reach for something to hold onto. Pt requesting to toilet. Ambulated back to room and ended session toileting and in care of NT, all needs met. Made RN aware that pt was requesting medication for "abdominal cramping".   Therapy Documentation Precautions:  Precautions Precautions: Fall Precaution Comments: L hemianopsia Restrictions Weight Bearing Restrictions: No Vital Signs: Therapy Vitals Temp: 98.4 F (36.9 C) Pulse Rate: 71 Resp: 17 BP: 119/74 Patient Position (if appropriate): Lying Oxygen Therapy SpO2: 98 %  Therapy/Group: Individual Therapy   K  03/06/2020, 7:35 AM  

## 2020-03-06 NOTE — Plan of Care (Signed)
  Problem: Consults Goal: RH STROKE PATIENT EDUCATION Description: See Patient Education module for education specifics  Outcome: Progressing   Problem: RH SAFETY Goal: RH STG ADHERE TO SAFETY PRECAUTIONS W/ASSISTANCE/DEVICE Description: STG Adhere to Safety Precautions With cues/reminders Assistance/Device. Outcome: Progressing   Problem: RH KNOWLEDGE DEFICIT Goal: RH STG INCREASE KNOWLEDGE OF HYPERTENSION Description: Pt will be able to demonstrate understanding of HTN management with medication compliance and diet control using handouts/booklets with mod I assist prior to DC Outcome: Progressing Goal: RH STG INCREASE KNOWLEGDE OF HYPERLIPIDEMIA Description: Pt will be able to demonstrate understanding of HLD management with medication compliance and diet control using handouts/booklets with mod I assist prior to DC Outcome: Progressing Goal: RH STG INCREASE KNOWLEDGE OF STROKE PROPHYLAXIS Description: Pt will be able to demonstrate understanding of stroke prevention with medication compliance and diet control using handouts/booklets with mod I assist prior to DC Outcome: Progressing   Problem: RH Vision Goal: RH LTG Vision (Specify) Outcome: Progressing

## 2020-03-06 NOTE — Progress Notes (Signed)
Occupational Therapy Session Note  Patient Details  Name: Christian [name] MRN: [id number] Date of Birth: May 04, 1941  Today's Date: 03/06/2020 OT Individual Time: 1410-1450 OT Individual Time Calculation (min): 40 min    Short Term Goals: Week 1:  OT Short Term Goal 1 (Week 1): LTG=STG 2/2 ELOS  Skilled Therapeutic Interventions/Progress Updates:      Pt seen for BADL retraining of toileting, bathing, and dressing with a focus on balance, safety awareness and problem solving. Overall pt was at a CGA level. He needs a great deal of extra time as he has slow processing and is very methodical with how he accomplishes tasks. He frequently asks the same question over several times demonstrating memory impairments.  Decreased divided attention, for ex could not concentrate on answering a question while donning his pants. With LB dressing, he had one LOB landing his seat onto the bed as he pulled his pants over his hips.   Pt opted to lay down in bed. Bed alarm set and all needs met to pt's desire.    Therapy Documentation Precautions:  Precautions Precautions: Fall Precaution Comments: L hemianopsia Restrictions Weight Bearing Restrictions: No    Vital Signs: Therapy Vitals Temp: 98.4 F (36.9 C) Pulse Rate: 75 Resp: (!) 22 BP: 102/70 Patient Position (if appropriate): Lying Oxygen Therapy SpO2: 98 % O2 Device: Room Air Pain: Pain Assessment Pain Score: 0-No pain ADL: ADL Eating: Set up Where Assessed-Eating: Edge of bed Grooming: Setup Upper Body Bathing: Supervision/safety Where Assessed-Upper Body Bathing: Shower Lower Body Bathing: Contact guard Where Assessed-Lower Body Bathing: Shower Upper Body Dressing: Supervision/safety Where Assessed-Upper Body Dressing: Edge of bed Lower Body Dressing: Minimal assistance Where Assessed-Lower Body Dressing: Edge of bed Toileting: Contact guard Where Assessed-Toileting: Glass blower/designer: Therapist, music  Method: Counselling psychologist: Other (comment)(None) Social research officer, government: Curator Method: Stand pivot, Ambulating   Therapy/Group: Individual Therapy  [place] 03/06/2020, 4:14 PM

## 2020-03-06 NOTE — Progress Notes (Signed)
Physical Therapy Session Note  Patient Details  Name: Christian [name] MRN: [id number] Date of Birth: 1941-05-10  Today's Date: 03/06/2020 PT Individual Time: 1002-1100 PT Individual Time Calculation (min): 58 min   Short Term Goals: Week 1:  PT Short Term Goal 1 (Week 1): STG+LTGs due to LOS.  Skilled Therapeutic Interventions/Progress Updates:    Patient received up in [facility], pleasant and ready to work with therapy today. Able to perform sit to stand with S, all other functional transfers with min guard and no device; also tolerated gait training multiple distances of over 176f with with no device and min guard today. Tolerated progression of Nustep on level 4 with BLES only and pace of 50-60SPM but very fatigued afterwards. Worked on balance training with lateral cone taps and backwards walking, also sit to stands with no hands and overhead ball press and ball rotations in standing. From here patient became very frustrated and stated that he is wanting to leave the unit early- states "staying here is worse than death for me", frustrated by not being able to take care of himself and rest well- convinced him to hold on through conference tomorrow and alerted MD/social worker. Left in bed with all needs met, bed alarm active this morning.   Therapy Documentation Precautions:  Precautions Precautions: Fall Precaution Comments: L hemianopsia Restrictions Weight Bearing Restrictions: No Pain: Pain Assessment Pain Scale: 0-10 Pain Score: 0-No pain    Therapy/Group: Individual Therapy   KWindell Norfolk DPT, PN1   Supplemental Physical Therapist CBurnett   Pager [phone] Rehab Office [phone]   03/06/2020, 12:15 PM

## 2020-03-06 NOTE — Progress Notes (Signed)
[name] PHYSICAL MEDICINE & REHABILITATION PROGRESS NOTE  Subjective/Complaints: Patient seen laying in bed this morning.  He states he slept well overnight.  He notes his cramps have significantly improved.  ROS: Denies CP, SOB, N/V/D  Objective: Vital Signs: Blood pressure 119/74, pulse 71, temperature 98.4 F (36.9 C), resp. rate 17, weight 75.6 kg, SpO2 98 %. No results found. No results for input(s): WBC, HGB, HCT, PLT in the last 72 hours. No results for input(s): NA, K, CL, CO2, GLUCOSE, BUN, CREATININE, CALCIUM in the last 72 hours.  Physical Exam: BP 119/74   Pulse 71   Temp 98.4 F (36.9 C)   Resp 17   Wt 75.6 kg   SpO2 98%   BMI 26.90 kg/m   Constitutional: No distress . Vital signs reviewed. HENT: Normocephalic.  Atraumatic.  HOH. Eyes: EOMI. No discharge. Cardiovascular: No JVD. Respiratory: Normal effort.  No stridor. GI: Non-distended. Skin: Warm and dry.  Intact. Psych: Normal mood.  Normal behavior. Musc: No edema in extremities.  No tenderness in extremities. Neuro: Alert Motor: 5/5 throughout, stable Right upper extremity: Mild dysmetria  Assessment/Plan: 1. Functional deficits secondary to acute right PCA infarct which require 3+ hours per day of interdisciplinary therapy in a comprehensive inpatient rehab setting.  Physiatrist is providing close team supervision and 24 hour management of active medical problems listed below.  Physiatrist and rehab team continue to assess barriers to discharge/monitor patient progress toward functional and medical goals  Care Tool:  Bathing    Body parts bathed by patient: Right arm, Left arm, Chest, Abdomen, Front perineal area, Buttocks, Right upper leg, Left upper leg, Right lower leg, Left lower leg, Face         Bathing assist Assist Level: Minimal Assistance - Patient > 75%     Upper Body Dressing/Undressing Upper body dressing   What is the patient wearing?: Pull over shirt    Upper body  assist Assist Level: Minimal Assistance - Patient > 75%    Lower Body Dressing/Undressing Lower body dressing      What is the patient wearing?: Pants     Lower body assist Assist for lower body dressing: Minimal Assistance - Patient > 75%     Toileting Toileting Toileting Activity did not occur (Clothing management and hygiene only): N/A (no void or bm)  Toileting assist Assist for toileting: Independent with assistive device Assistive Device Comment: urinal   Transfers Chair/bed transfer  Transfers assist     Chair/bed transfer assist level: Contact Guard/Touching assist     Locomotion Ambulation   Ambulation assist      Assist level: Contact Guard/Touching assist Assistive device: No Device Max distance: >150'   Walk 10 feet activity   Assist     Assist level: Contact Guard/Touching assist Assistive device: No Device   Walk 50 feet activity   Assist    Assist level: Contact Guard/Touching assist Assistive device: No Device    Walk 150 feet activity   Assist Walk 150 feet activity did not occur: Safety/medical concerns  Assist level: Contact Guard/Touching assist Assistive device: No Device    Walk 10 feet on uneven surface  activity   Assist Walk 10 feet on uneven surfaces activity did not occur: Safety/medical concerns         Wheelchair     Assist Will patient use wheelchair at discharge?: No Type of Wheelchair: Manual    Wheelchair assist level: Contact Guard/Touching assist Max wheelchair distance: 50    Wheelchair  50 feet with 2 turns activity    Assist        Assist Level: Contact Guard/Touching assist   Wheelchair 150 feet activity     Assist Wheelchair 150 feet activity did not occur: Safety/medical concerns          Medical Problem List and Plan: 1.  Deficits of dizziness secondary to right PCA acute infarct with severe stenosis of proximal mid right P2 segment, with markedly attenuated flow  within the right PCA distally.  Moderate to severe proximal left P2 stenosis  Continue CIR  2.  Antithrombotics: -DVT/anticoagulation: Lovenox initiated 02/29/2020             -antiplatelet therapy: Aspirin 81 mg daily and Plavix 75 mg daily x3 months then aspirin alone 3. Pain Management: Tylenol as needed.  Monitor for headaches with increased exertion.  Controlled on 5/18 4. Mood: Provide emotional support             -antipsychotic agents: N/A 5. Neuropsych: This patient is capable of making decisions on his own behalf. 6. Skin/Wound Care: Routine skin checks 7. Fluids/Electrolytes/Nutrition: Routine in and outs.  BMP within acceptable range on 5/14, labs pending 8.  Chronic systolic congestive heart failure.  Lasix 20 mg 3 times weekly Monday Wednesday Friday, Entresto 24-26 mg twice daily.  Monitor for any signs of fluid overload Filed Weights   03/06/20 0439  Weight: 75.6 kg    Daily weights ordered. 9.  Hypertension.    Coreg 6.25 mg twice daily, decreased to 3.125 daily  Controlled on 5/18  Monitor with increased mobility 10.  CAD/nonischemic cardiomyopathy.  Continue Coreg 11.  Diet-controlled diabetes mellitus.  Latest hemoglobin A1c 6.4.    Controlled, CBGs d/ced  Monitor with increased mobility 12.  Hyperlipidemia.  Crestor 5mg . Do not increase dose given side effects of myalgias.  13.  GERD.  Nexium with benefit 14. Abdominal cramps  Simethicone started on 5/17  Improving  LOS: 5 days A FACE TO FACE EVALUATION WAS PERFORMED  Christian [name] Christian [name] 03/06/2020, 8:49 AM

## 2020-03-07 ENCOUNTER — Inpatient Hospital Stay (HOSPITAL_COMMUNITY): Payer: Medicare Other | Admitting: Physical Therapy

## 2020-03-07 ENCOUNTER — Inpatient Hospital Stay (HOSPITAL_COMMUNITY): Payer: Medicare Other | Admitting: Speech Pathology

## 2020-03-07 ENCOUNTER — Inpatient Hospital Stay (HOSPITAL_COMMUNITY): Payer: Medicare Other | Admitting: Occupational Therapy

## 2020-03-07 DIAGNOSIS — N182 Chronic kidney disease, stage 2 (mild): Secondary | ICD-10-CM

## 2020-03-07 MED ORDER — CLOPIDOGREL BISULFATE 75 MG PO TABS: 75.0000 mg | ORAL_TABLET | Freq: Every day | ORAL | 1 refills | Status: DC

## 2020-03-07 MED ORDER — ENTRESTO 24-26 MG PO TABS: 1.0000 | ORAL_TABLET | Freq: Two times a day (BID) | ORAL | 3 refills | Status: DC

## 2020-03-07 MED ORDER — CARVEDILOL 3.125 MG PO TABS: 3.1250 mg | ORAL_TABLET | Freq: Every day | ORAL | 0 refills | Status: DC

## 2020-03-07 MED ORDER — VITAMIN D 1000 UNITS PO TABS: 1000.0000 [IU] | ORAL_TABLET | Freq: Every day | ORAL | 0 refills | Status: DC

## 2020-03-07 MED ORDER — ROSUVASTATIN CALCIUM 5 MG PO TABS: 5.0000 mg | ORAL_TABLET | Freq: Every day | ORAL | 3 refills | Status: DC

## 2020-03-07 MED ORDER — ACETAMINOPHEN 325 MG PO TABS: 650.0000 mg | ORAL_TABLET | ORAL | Status: DC | PRN

## 2020-03-07 MED ORDER — ESOMEPRAZOLE MAGNESIUM 40 MG PO CPDR: 40.0000 mg | DELAYED_RELEASE_CAPSULE | ORAL | 5 refills | Status: DC | PRN

## 2020-03-07 MED ORDER — OLOPATADINE HCL 0.1 % OP SOLN: 1.0000 [drp] | OPHTHALMIC | 2 refills | Status: DC | PRN

## 2020-03-07 MED ORDER — FUROSEMIDE 40 MG PO TABS: 20.0000 mg | ORAL_TABLET | ORAL | 6 refills | Status: DC

## 2020-03-07 MED ORDER — ALPRAZOLAM 0.25 MG PO TABS
0.2500 mg | ORAL_TABLET | Freq: Once | ORAL | Status: AC
  Administered 2020-03-07: 0.25 mg via ORAL
  Filled 2020-03-07: qty 1

## 2020-03-07 NOTE — Discharge Summary (Addendum)
Physician Discharge Summary  Patient ID: Christian [name] MRN: [id number] DOB/AGE: 1941-09-16 79 y.o.  Admit date: 03/01/2020 Discharge date: 03/08/2020  Discharge Diagnoses:  Principal Problem:   Acute ischemic right posterior cerebral artery (PCA) stroke (HCC) Active Problems:   Diabetes mellitus type 2 in nonobese (HCC)   Chronic systolic congestive heart failure (HCC)   Benign essential HTN   Abdominal cramping   Stage 2 chronic kidney disease CAD with nonischemic cardiomyopathy GERD Hyperlipidemia  Discharged Condition: Stable  Significant Diagnostic Studies: CT ANGIO HEAD W OR WO CONTRAST  Result Date: 02/26/2020 CLINICAL DATA:  Follow-up examination for acute stroke. EXAM: CT ANGIOGRAPHY HEAD AND NECK TECHNIQUE: Multidetector CT imaging of the head and neck was performed using the standard protocol during bolus administration of intravenous contrast. Multiplanar CT image reconstructions and MIPs were obtained to evaluate the vascular anatomy. Carotid stenosis measurements (when applicable) are obtained utilizing NASCET criteria, using the distal internal carotid diameter as the denominator. CONTRAST:  141mL OMNIPAQUE IOHEXOL 350 MG/ML SOLN COMPARISON:  Prior MRI from 02/25/2020. FINDINGS: CT HEAD FINDINGS Brain: Age-related cerebral atrophy with chronic small vessel ischemic disease. Evolving cytotoxic edema within the right occipital lobe compatible with previously identified acute right PCA territory infarct. No associated mass effect. No other acute large vessel territory infarct. Mild hyperdensity seen involving the right parieto-occipital region, just superior to the area of infarction is seen, suspicious for small volume subarachnoid hemorrhage (series 5, image 19). No other acute intracranial hemorrhage. No mass lesion or midline shift. No hydrocephalus or extra-axial fluid collection. Vascular: No asymmetric hyperdense vessel. Calcified atherosclerosis seen about the skull base.  Skull: Scalp soft tissues and calvarium within normal limits. Sinuses: Paranasal sinuses and mastoid air cells are clear. Orbits: Globes and orbital soft tissues demonstrate no acute finding. Review of the MIP images confirms the above findings CTA NECK FINDINGS Aortic arch: Visualized aortic arch of normal caliber with normal branch pattern. Mild-to-moderate atheromatous change about the aortic arch and origin of the great vessels without hemodynamically significant stenosis. Atheromatous plaque at the origin of the right subclavian artery with associated stenosis of up to 50% (series 12, image 195). Visualized subclavian arteries otherwise widely patent. Right carotid system: Scattered plaque within the mid-distal right CCA without significant stenosis. Bulky calcified plaque about the right bifurcation/proximal right ICA with associated stenosis of up to 40% by NASCET criteria. Right ICA tortuous but otherwise patent distally to the skull base. Left carotid system: Left common carotid artery patent from its origin to the bifurcation without stenosis. Bulky calcified plaque about the left bifurcation with associated stenosis of up to 50% by NASCET criteria. Left ICA tortuous but otherwise patent to the skull base. Vertebral arteries: Both vertebral arteries arise from the subclavian arteries. Left vertebral artery dominant with a diffusely hypoplastic right vertebral artery. Left vertebral artery tortuous proximally. Vertebral arteries patent within the neck without stenosis, dissection, or occlusion. Skeleton: No acute osseous abnormality. No discrete or worrisome osseous lesions. Multilevel cervical spondylosis without high-grade stenosis. Other neck: No other acute soft tissue abnormality within the neck. 12 mm left thyroid nodule noted. No other mass lesion or adenopathy. Upper chest: Visualized upper chest demonstrates no acute finding. Centrilobular emphysema. Review of the MIP images confirms the above  findings CTA HEAD FINDINGS Anterior circulation: Petrous segments patent bilaterally. Scattered atheromatous plaque within the cavernous/supraclinoid ICAs, right slightly worse than left. Relatively mild multifocal narrowing on the left. Short-segment moderate approximate 50% stenosis at the supraclinoid right ICA (series 11, image 119).  A1 segments patent bilaterally. Normal anterior communicating artery complex. Anterior cerebral arteries patent to their distal aspects without stenosis. Diffuse atheromatous irregularity throughout the M1 segments with associated mild multifocal narrowing. Normal MCA bifurcations. Distal MCA branches well perfused although demonstrate diffuse small vessel atheromatous irregularity. Posterior circulation: Scattered atheromatous plaque throughout the dominant left V4 segment without high-grade stenosis. Left PICA patent. Hypoplastic right vertebral artery largely terminates in PICA, and then subsequently occludes. Some retrograde filling into the distal right V4 segment across the vertebrobasilar junction. Right PICA patent. Basilar demonstrates mild atheromatous irregularity but is widely patent to its distal aspect. Superior cerebral arteries patent bilaterally. Both PCAs primarily supplied via the basilar. Diffuse moderate to severe stenosis of the proximal left P2 segment. On the right, there is severe stenosis of the proximal-mid right P2 segment (series 14, image 24). Right PCA is markedly attenuated but grossly patent distally. Venous sinuses: Not well assessed due to timing the contrast bolus. Anatomic variants: Hypoplastic right vertebral artery largely terminating in PICA. Review of the MIP images confirms the above findings IMPRESSION: CT HEAD IMPRESSION: 1. Patchy evolving acute right PCA territory infarct, stable from previous MRA. 2. Patchy hyperdensity involving the right parietal lobe, suspicious for possible small volume subarachnoid hemorrhage. While laminar  necrosis could conceivably have this appearance as well, there appears to be some FLAIR signal abnormality within the cortical sulci on previous brain MRI, favoring hemorrhage. 3. Underlying atrophy with chronic small vessel ischemic disease. CTA HEAD AND NECK IMPRESSION: 1. Negative CTA for emergent large vessel occlusion. 2. Severe stenosis of the proximal-mid right P2 segment, with markedly attenuated flow within the right PCA distally. 3. Moderate to severe proximal left P2 stenosis. 4. Hypoplastic right vertebral artery largely terminates in PICA. Moderate atheromatous irregularity throughout the dominant left V4 segment without high-grade stenosis. 5. Atheromatous plaque about the carotid bifurcations with associated stenosis of up to 40% on the right and 50% on the left. 6. 50% stenosis at the origin of the right subclavian artery. 7.  Emphysema (ICD10-J43.9). 8. 12 mm left thyroid nodule, of doubtful significance given size and patient age. No follow-up imaging recommended. (Ref: J Am Coll Radiol. 2015 Feb;12(2): 143-50). Critical Value/emergent results were called by telephone at the time of interpretation on 02/26/2020 at 2:33 am to provider Dr. Rory Percy, who verbally acknowledged these results. Electronically Signed   By: Jeannine Boga M.D.   On: 02/26/2020 02:39   DG Chest 2 View  Result Date: 02/24/2020 CLINICAL DATA:  Abnormal electrocardiogram.  Confusion. EXAM: CHEST - 2 VIEW COMPARISON:  September 14, 2017 FINDINGS: There is atelectatic change in the left base. The lungs elsewhere are clear. Heart is upper normal in size with pulmonary vascularity normal. No adenopathy. There is an apparent hiatal hernia. There is degenerative change in the thoracic spine. IMPRESSION: Left base atelectasis. Lungs elsewhere clear. Heart upper normal in size. Hiatal hernia present. No adenopathy appreciable. Electronically Signed   By: Lowella Grip III M.D.   On: 02/24/2020 10:37   CT HEAD WO  CONTRAST  Result Date: 02/27/2020 CLINICAL DATA:  Diabetes, hypertension, stroke EXAM: CT HEAD WITHOUT CONTRAST TECHNIQUE: Contiguous axial images were obtained from the base of the skull through the vertex without intravenous contrast. COMPARISON:  02/26/2020, 02/25/2020 FINDINGS: Brain: Continued evolution of the right occipital infarct seen previously. The subtle hyperdensity within the sulci in the right parietooccipital region just superior to the infarct bed again identified, consistent with petechial hemorrhage and unchanged since prior exam. There are  stable hypodensities throughout the periventricular white matter consistent with chronic small vessel ischemic changes. Lateral ventricles and midline structures are stable. No acute extra-axial fluid collections. No mass effect. Vascular: No hyperdense vessel or unexpected calcification. Skull: Normal. Negative for fracture or focal lesion. Sinuses/Orbits: No acute finding. Other: None. IMPRESSION: 1. Stable right PCA territory infarct involving the right occipital lobe. 2. Stable petechial hemorrhage within the right parietooccipital sulci just superior to the infarct bed. Electronically Signed   By: Randa Ngo M.D.   On: 02/27/2020 02:53   CT ANGIO NECK W OR WO CONTRAST  Result Date: 02/26/2020 CLINICAL DATA:  Follow-up examination for acute stroke. EXAM: CT ANGIOGRAPHY HEAD AND NECK TECHNIQUE: Multidetector CT imaging of the head and neck was performed using the standard protocol during bolus administration of intravenous contrast. Multiplanar CT image reconstructions and MIPs were obtained to evaluate the vascular anatomy. Carotid stenosis measurements (when applicable) are obtained utilizing NASCET criteria, using the distal internal carotid diameter as the denominator. CONTRAST:  129mL OMNIPAQUE IOHEXOL 350 MG/ML SOLN COMPARISON:  Prior MRI from 02/25/2020. FINDINGS: CT HEAD FINDINGS Brain: Age-related cerebral atrophy with chronic small vessel  ischemic disease. Evolving cytotoxic edema within the right occipital lobe compatible with previously identified acute right PCA territory infarct. No associated mass effect. No other acute large vessel territory infarct. Mild hyperdensity seen involving the right parieto-occipital region, just superior to the area of infarction is seen, suspicious for small volume subarachnoid hemorrhage (series 5, image 19). No other acute intracranial hemorrhage. No mass lesion or midline shift. No hydrocephalus or extra-axial fluid collection. Vascular: No asymmetric hyperdense vessel. Calcified atherosclerosis seen about the skull base. Skull: Scalp soft tissues and calvarium within normal limits. Sinuses: Paranasal sinuses and mastoid air cells are clear. Orbits: Globes and orbital soft tissues demonstrate no acute finding. Review of the MIP images confirms the above findings CTA NECK FINDINGS Aortic arch: Visualized aortic arch of normal caliber with normal branch pattern. Mild-to-moderate atheromatous change about the aortic arch and origin of the great vessels without hemodynamically significant stenosis. Atheromatous plaque at the origin of the right subclavian artery with associated stenosis of up to 50% (series 12, image 195). Visualized subclavian arteries otherwise widely patent. Right carotid system: Scattered plaque within the mid-distal right CCA without significant stenosis. Bulky calcified plaque about the right bifurcation/proximal right ICA with associated stenosis of up to 40% by NASCET criteria. Right ICA tortuous but otherwise patent distally to the skull base. Left carotid system: Left common carotid artery patent from its origin to the bifurcation without stenosis. Bulky calcified plaque about the left bifurcation with associated stenosis of up to 50% by NASCET criteria. Left ICA tortuous but otherwise patent to the skull base. Vertebral arteries: Both vertebral arteries arise from the subclavian arteries.  Left vertebral artery dominant with a diffusely hypoplastic right vertebral artery. Left vertebral artery tortuous proximally. Vertebral arteries patent within the neck without stenosis, dissection, or occlusion. Skeleton: No acute osseous abnormality. No discrete or worrisome osseous lesions. Multilevel cervical spondylosis without high-grade stenosis. Other neck: No other acute soft tissue abnormality within the neck. 12 mm left thyroid nodule noted. No other mass lesion or adenopathy. Upper chest: Visualized upper chest demonstrates no acute finding. Centrilobular emphysema. Review of the MIP images confirms the above findings CTA HEAD FINDINGS Anterior circulation: Petrous segments patent bilaterally. Scattered atheromatous plaque within the cavernous/supraclinoid ICAs, right slightly worse than left. Relatively mild multifocal narrowing on the left. Short-segment moderate approximate 50% stenosis at the supraclinoid  right ICA (series 11, image 119). A1 segments patent bilaterally. Normal anterior communicating artery complex. Anterior cerebral arteries patent to their distal aspects without stenosis. Diffuse atheromatous irregularity throughout the M1 segments with associated mild multifocal narrowing. Normal MCA bifurcations. Distal MCA branches well perfused although demonstrate diffuse small vessel atheromatous irregularity. Posterior circulation: Scattered atheromatous plaque throughout the dominant left V4 segment without high-grade stenosis. Left PICA patent. Hypoplastic right vertebral artery largely terminates in PICA, and then subsequently occludes. Some retrograde filling into the distal right V4 segment across the vertebrobasilar junction. Right PICA patent. Basilar demonstrates mild atheromatous irregularity but is widely patent to its distal aspect. Superior cerebral arteries patent bilaterally. Both PCAs primarily supplied via the basilar. Diffuse moderate to severe stenosis of the proximal left  P2 segment. On the right, there is severe stenosis of the proximal-mid right P2 segment (series 14, image 24). Right PCA is markedly attenuated but grossly patent distally. Venous sinuses: Not well assessed due to timing the contrast bolus. Anatomic variants: Hypoplastic right vertebral artery largely terminating in PICA. Review of the MIP images confirms the above findings IMPRESSION: CT HEAD IMPRESSION: 1. Patchy evolving acute right PCA territory infarct, stable from previous MRA. 2. Patchy hyperdensity involving the right parietal lobe, suspicious for possible small volume subarachnoid hemorrhage. While laminar necrosis could conceivably have this appearance as well, there appears to be some FLAIR signal abnormality within the cortical sulci on previous brain MRI, favoring hemorrhage. 3. Underlying atrophy with chronic small vessel ischemic disease. CTA HEAD AND NECK IMPRESSION: 1. Negative CTA for emergent large vessel occlusion. 2. Severe stenosis of the proximal-mid right P2 segment, with markedly attenuated flow within the right PCA distally. 3. Moderate to severe proximal left P2 stenosis. 4. Hypoplastic right vertebral artery largely terminates in PICA. Moderate atheromatous irregularity throughout the dominant left V4 segment without high-grade stenosis. 5. Atheromatous plaque about the carotid bifurcations with associated stenosis of up to 40% on the right and 50% on the left. 6. 50% stenosis at the origin of the right subclavian artery. 7.  Emphysema (ICD10-J43.9). 8. 12 mm left thyroid nodule, of doubtful significance given size and patient age. No follow-up imaging recommended. (Ref: J Am Coll Radiol. 2015 Feb;12(2): 143-50). Critical Value/emergent results were called by telephone at the time of interpretation on 02/26/2020 at 2:33 am to provider Dr. Rory Percy, who verbally acknowledged these results. Electronically Signed   By: Jeannine Boga M.D.   On: 02/26/2020 02:39   MR Brain W and Wo  Contrast  Result Date: 02/25/2020 CLINICAL DATA:  Dizziness and confusion EXAM: MRI HEAD WITHOUT AND WITH CONTRAST TECHNIQUE: Multiplanar, multiecho pulse sequences of the brain and surrounding structures were obtained without and with intravenous contrast. CONTRAST:  7.55mL GADAVIST GADOBUTROL 1 MMOL/ML IV SOLN COMPARISON:  12/16/2014 FINDINGS: Brain: Patchy restricted diffusion in the right occipital and medial temporal cortex, including along the hippocampus. Minimal stippled restricted diffusion in the right thalamus. No acute hemorrhage, hydrocephalus, or masslike finding. Chronic blood products along the bilateral occipital cortex. Two remote white matter insults the left cerebral hemisphere and a remote left thalamic microhemorrhage. Mild remote cortical infarct along the left occipital convexity. Moderate chronic small vessel ischemia in the white matter. Vascular: Absent flow in the non dominant right vertebral artery by T2 weighted imaging. Skull and upper cervical spine: Negative for marrow lesion. Sinuses/Orbits: Negative IMPRESSION: 1. Patchy acute infarct in the right PCA territory. 2. Remote left occipital cortex infarct and remote bilateral superficial occipital hemorrhage. 3. Abnormal or  absent flow in the non dominant right vertebral artery Electronically Signed   By: Monte Fantasia M.D.   On: 02/25/2020 22:30   ECHOCARDIOGRAM COMPLETE  Result Date: 02/26/2020    ECHOCARDIOGRAM REPORT   Patient Name:   ISON GAIDA Date of Exam: 02/26/2020 Medical Rec #:  [id number]     Height:       66.0 in Accession #:    [id number]    Weight:       165.0 lb Date of Birth:  [birth year]     BSA:          1.843 m Patient Age:    44 years      BP:           102/68 mmHg Patient Gender: M             HR:           91 bpm. Exam Location:  Inpatient Procedure: 2D Echo Indications:    CVA  History:        Patient has prior history of Echocardiogram examinations, most                 recent 08/04/2018. CAD; Risk  Factors:Hypertension, Diabetes and                 Dyslipidemia.  Sonographer:    Johny Chess Referring Phys: [id number] BRIAN MILLER  Sonographer Comments: Technically difficult study due to poor echo windows. IMPRESSIONS  1. Left ventricular ejection fraction, by estimation, is 60 to 65%. The left ventricle has normal function. The left ventricle has no regional wall motion abnormalities. Left ventricular diastolic function could not be evaluated.  2. Right ventricular systolic function is normal. The right ventricular size is normal.  3. The mitral valve is grossly normal. No evidence of mitral valve regurgitation. No evidence of mitral stenosis.  4. The aortic valve is tricuspid. Aortic valve regurgitation is not visualized. Mild aortic valve sclerosis is present, with no evidence of aortic valve stenosis.  5. Aortic dilatation noted. There is moderate dilatation of the ascending aorta measuring 42 mm. FINDINGS  Left Ventricle: Left ventricular ejection fraction, by estimation, is 60 to 65%. The left ventricle has normal function. The left ventricle has no regional wall motion abnormalities. The left ventricular internal cavity size was normal in size. There is  no left ventricular hypertrophy. Left ventricular diastolic function could not be evaluated. Right Ventricle: The right ventricular size is normal. No increase in right ventricular wall thickness. Right ventricular systolic function is normal. Left Atrium: Left atrial size was normal in size. Right Atrium: Right atrial size was not well visualized. Pericardium: Trivial pericardial effusion is present. Mitral Valve: The mitral valve is grossly normal. No evidence of mitral valve regurgitation. No evidence of mitral valve stenosis. Tricuspid Valve: The tricuspid valve is normal in structure. Tricuspid valve regurgitation is trivial. No evidence of tricuspid stenosis. Aortic Valve: The aortic valve is tricuspid. . There is mild thickening and mild  calcification of the aortic valve. Aortic valve regurgitation is not visualized. Mild aortic valve sclerosis is present, with no evidence of aortic valve stenosis. There is mild thickening of the aortic valve. There is mild calcification of the aortic valve. Pulmonic Valve: The pulmonic valve was grossly normal. Pulmonic valve regurgitation is trivial. No evidence of pulmonic stenosis. Aorta: Aortic dilatation noted. There is moderate dilatation of the ascending aorta measuring 42 mm. IAS/Shunts: The atrial septum is grossly normal.  LEFT VENTRICLE PLAX 2D  LVIDd:         4.10 cm LVIDs:         2.70 cm LV PW:         1.20 cm LV IVS:        1.10 cm LVOT diam:     2.10 cm LV SV:         39 LV SV Index:   21 LVOT Area:     3.46 cm  LEFT ATRIUM           Index LA diam:      4.20 cm 2.28 cm/m LA Vol (A4C): 43.9 ml 23.82 ml/m  AORTIC VALVE LVOT Vmax:   61.80 cm/s LVOT Vmean:  39.800 cm/s LVOT VTI:    0.112 m  AORTA Ao Root diam: 3.60 cm Ao Asc diam:  4.20 cm MV E velocity: 79.50 cm/s MV A velocity: 125.00 cm/s  SHUNTS MV E/A ratio:  0.64         Systemic VTI:  0.11 m                             Systemic Diam: 2.10 cm Mertie Moores MD Electronically signed by Mertie Moores MD Signature Date/Time: 02/26/2020/12:54:14 PM    Final     Labs:  Basic Metabolic Panel: Recent Labs  Lab 03/01/20 1800 03/02/20 0604 03/06/20 0951  NA  --  138 139  K  --  3.6 4.4  CL  --  103 100  CO2  --  23 27  GLUCOSE  --  107* 140*  BUN  --  18 16  CREATININE 1.13 1.12 1.20  CALCIUM  --  8.7* 9.2    CBC: Recent Labs  Lab 03/01/20 1800 03/02/20 0604  WBC 6.7 6.6  NEUTROABS  --  4.0  HGB 15.3 15.1  HCT 47.2 46.5  MCV 89.9 89.8  PLT 239 216    CBG: Recent Labs  Lab 03/01/20 0603 03/01/20 0716 03/01/20 1133 03/01/20 1524  GLUCAP 111* 111* 124* 111*   Family history.  Mother with CVA.  Father with CVA.  Negative for diabetes mellitus hyperlipidemia colon cancer rectal cancer  Brief HPI:   Wadie Rodkey is a  79 y.o. right-handed male with history of chronic systolic congestive heart failure, CAD nonischemic cardiomyopathy maintained on aspirin, hypertension, diabetes mellitus.  Per chart review lives alone one level home works for a [employer].  He has an ex-wife who can assist on discharge.  Presented 02/26/2020 with dizziness lightheaded and increased numbness bilateral lower extremities.  MRI showed patchy acute infarct in the right PCA territory.  Remote left occipital cortex infarct and remote bilateral superficial occipital hemorrhage.  CT angiogram of head and neck negative for emergent large vessel occlusion.  Echocardiogram with ejection fraction of 65% no wall motion abnormalities.  Patient did not receive TPA.  Admission chemistries unremarkable.  Follow-up CT scan completed 02/27/2020 showed stable right PCA territory infarct.  Maintained on low-dose aspirin as well as Plavix x3 months then aspirin alone.  Subcutaneous Lovenox for DVT prophylaxis 02/29/2020.  Tolerating a regular diet.  Patient was admitted for a comprehensive rehab program   Hospital Course: Giovanie Schrack was admitted to rehab 03/01/2020 for inpatient therapies to consist of PT, ST and OT at least three hours five days a week. Past admission physiatrist, therapy team and rehab RN have worked together to provide customized collaborative inpatient rehab.  Pertaining to patient's right  PCA acute infarction with severe stenosis proximal mid right P2 segment remained stable he continue on aspirin Plavix x3 months and aspirin alone.  He would follow-up with neurology services.  Subcutaneous Lovenox for DVT prophylaxis no bleeding episodes.  He exhibited no signs of fluid overload receiving Lasix as directed as well as Entresto.  Blood pressure controlled on low-dose Coreg he would follow-up with his primary MD.  CAD with nonischemic cardiomyopathy continue Coreg no chest pain or shortness of breath.  Blood sugars controlled hemoglobin A1c of  6.4.  Crestor ongoing for hyperlipidemia dose was decreased due to some myalgias.  History of GERD maintained on PPI.   Blood pressures were monitored on TID basis and controlled  Diabetes has been monitored with ac/hs CBG checks and SSI was use prn for tighter BS control.   He/ is continent of bowel and bladder.  He/ has made gains during rehab stay and is attending therapies  He/ will continue to receive follow up therapies   after discharge  Rehab course: During patient's stay in rehab weekly team conferences were held to monitor patient's progress, set goals and discuss barriers to discharge. At admission, patient required moderate assist to ambulate 50 ft one person hand-held assist minimal assist sit to stand minimal guard supine to sit minimal assist upper body bathing max is lower body bathing minimal assist upper body dressing max assist lower body dressing  Physical exam.  Blood pressure 126/76 pulse 74 temperature 97.7 respiration 16 oxygen saturation 96% room air HEENT.  Head normocephalic and atraumatic Eyes.  Pupils round and reactive to light no discharge.nystagmus Neck.  Supple nontender no JVD without thyromegaly Cardiac regular rate rhythm without any extra sounds or murmur heard Respiratory effort normal no respiratory distress without wheeze Abdomen.  Soft nontender positive bowel sounds without rebound Neurological he was alert no acute distress follow commands somewhat anxious and demanding provides his name and age he did have some decreased awareness of his deficits motor strength 4+/5 throughout.  He/  has had improvement in activity tolerance, balance, postural control as well as ability to compensate for deficits. He/ has had improvement in functional use RUE/LUE  and RLE/LLE as well as improvement in awareness.  Patient able to perform sit to stand supervision all other functional transfers with minimal guard no device ambulates 150 ft without assistive device.   Worked on balance training with lateral cone taps.  Patient would come easily frustrated demanding at times to go home.  He can gather his belongings for activities delivered and homemaking he frequently asked the same question over several times demonstrating memory impairments decreased divided attention.  Teaching with his ex-wife was completed discussed the need for supervision for his safety.  He was discharged home with his ex-wife.       Disposition: Discharged home    Diet: Diabetic diet  Special Instructions: No driving smoking or alcohol  Continue aspirin 81 mg daily and Plavix 75 mg daily x3 months then aspirin alone  Medications at discharge. 1.  Tylenol as needed 2.  Aspirin 81 mg daily 3.  Coreg 3.125 mg daily 4 .  Vitamin D 1000 units p.o. daily 5.  Plavix 75 mg p.o. daily 6.  Lasix 20 mg 3 times daily 7.Patanolol ophthalmic solution 0.1% 1 drop both eyes twice daily as needed 8.  Crestor 5 mg daily 9.  Entresto 24-26 mg 1 tablet p.o. twice daily 10.  Protonix 40 mg p.o. daily   30-35 min were spent  completing discharge summary and discharge planning   Discharge Instructions     Ambulatory referral to Neurology   Complete by: As directed    An appointment is requested in approximately 4 weeks right PCA acute infarction   Ambulatory referral to Physical Medicine Rehab   Complete by: As directed    Moderate complexity follow up 1-2 weeks right PCA infarction       Follow-up Information     Jamse Arn, MD Follow up.   Specialty: Physical Medicine and Rehabilitation Why: Office to call for appointment Contact information: [place] [address] [phone]            Signed: Cathlyn Parsons 03/08/2020, 5:03 AM Patient was seen, face-face, and physical exam performed by me on day of discharge, greater than 30 minutes of total time spent.. Please see progress note from day of discharge as well.  Delice Lesch, MD,  ABPMR

## 2020-03-07 NOTE — Progress Notes (Signed)
Physical Therapy Discharge Summary  Patient Details  Name: Christian [name] MRN: [id number] Date of Birth: 02/10/1941  Today's Date: 03/07/2020   Patient has met 10 of 10 long term goals due to improved activity tolerance, improved balance, increased strength, ability to compensate for deficits and improved coordination.  Patient to discharge at an ambulatory level Modified Independent.   Patient's care partner is independent to provide the necessary physical and cognitive assistance at discharge.  Reasons goals not met: N/A- all goals met   Recommendation:  Patient will benefit from ongoing skilled PT services in outpatient setting to continue to advance safe functional mobility, address ongoing impairments primarily in functional balance and safety awareness, and minimize fall risk.  Equipment: No equipment provided  Reasons for discharge: treatment goals met and discharge from hospital  Patient/family agrees with progress made and goals achieved: Yes  PT Discharge Precautions/Restrictions Precautions Precautions: Fall Precaution Comments: L hemianopsia Restrictions Weight Bearing Restrictions: No Vital Signs Therapy Vitals Temp: 98.3 F (36.8 C) Temp Source: Oral Pulse Rate: 81 Resp: 14 BP: 90/73 Patient Position (if appropriate): Lying Oxygen Therapy SpO2: (!) 86 % O2 Device: Room Air Pain Pain Assessment Pain Scale: 0-10 Pain Score: 0-No pain Vision/Perception  Vision - Assessment Eye Alignment: Within Functional Limits Alignment/Gaze Preference: Within Defined Limits Saccades: Other (comment) Perception Perception: Impaired Inattention/Neglect: Does not attend to left visual field(needs occasional cues to find items in L visual field) Praxis Praxis: Intact  Cognition Overall Cognitive Status: Impaired/Different from baseline Arousal/Alertness: Awake/alert Attention: Selective Focused Attention: Appears intact Sustained Attention: Appears intact Selective  Attention: Impaired Awareness: Impaired Problem Solving: Impaired Reasoning: Impaired Organizing: Impaired Self Monitoring: Impaired Self Correcting: Impaired Behaviors: Poor frustration tolerance;Perseveration;Lability Safety/Judgment: Impaired Comments: left inattention, poor safety awareness and memory in general Sensation Sensation Light Touch: Appears Intact Hot/Cold: Appears Intact Proprioception: Appears Intact Stereognosis: Appears Intact Coordination Gross Motor Movements are Fluid and Coordinated: Yes Fine Motor Movements are Fluid and Coordinated: No Coordination and Movement Description: decreased bilaterally- slight ataxia in [facility] Heel Shin Test: mild decrease in control bilaterally Motor  Motor Motor: Within Functional Limits  Mobility Bed Mobility Bed Mobility: Rolling Right;Rolling Left;Supine to Sit;Sit to Supine Rolling Right: Independent Rolling Left: Independent Supine to Sit: Independent Sit to Supine: Independent Transfers Transfers: Sit to Stand;Stand to Sit;Stand Pivot Transfers Sit to Stand: Independent with assistive device Stand to Sit: Independent with assistive device Stand Pivot Transfers: Independent with assistive device Transfer (Assistive device): None Locomotion  Gait Gait Assistance: Independent Gait Distance (Feet): 150 Feet Assistive device: None Gait Gait: Yes Gait Pattern: Impaired Gait Pattern: Step-through pattern;Decreased step length - left;Decreased step length - right;Decreased dorsiflexion - right;Decreased dorsiflexion - left;Decreased weight shift to right;Decreased weight shift to left;Trendelenburg;Decreased trunk rotation;Trunk flexed;Wide base of support Stairs / Additional Locomotion Stairs: Yes Stairs Assistance: Independent with assistive device Stair Management Technique: Alternating pattern;One rail Left Number of Stairs: 12 Height of Stairs: 6 Ramp: Independent Curb: Independent with assistive  device Wheelchair Mobility Wheelchair Mobility: No  Trunk/Postural Assessment  Cervical Assessment Cervical Assessment: Exceptions to WFL(forward head) Thoracic Assessment Thoracic Assessment: Exceptions to WFL(rounded shoulders) Postural Control Postural Control: Within Functional Limits  Balance Balance Balance Assessed: Yes Standardized Balance Assessment Standardized Balance Assessment: Timed Up and Go Test Timed Up and Go Test TUG: Normal TUG Normal TUG (seconds): 13.9 Static Sitting Balance Static Sitting - Level of Assistance: 7: Independent Dynamic Sitting Balance Dynamic Sitting - Level of Assistance: 7: Independent Static Standing Balance Static Standing - Level of Assistance:  7: Independent Dynamic Standing Balance Dynamic Standing - Level of Assistance: 6: Modified independent (Device/Increase time) Extremity Assessment  RUE Assessment RUE Assessment: Not tested LUE Assessment LUE Assessment: Not tested RLE Assessment RLE Assessment: Within Functional Limits General Strength Comments: MMT 5/5 at aknle and knee, 4+/5 hip LLE Assessment LLE Assessment: Within Functional Limits General Strength Comments: MMT 5/5 ankle and knee, 4/5 hip     Windell Norfolk, DPT, PN1   Supplemental Physical Therapist [place]    Pager [phone] Acute Rehab Office [phone]

## 2020-03-07 NOTE — Progress Notes (Signed)
Occupational Therapy Session Note  Patient Details  Name: Christian [name] MRN: [id number] Date of Birth: 02-08-1941  Today's Date: 03/07/2020 OT Individual Time: 1517-6160 OT Individual Time Calculation (min): 40 min    Short Term Goals: Week 1:  OT Short Term Goal 1 (Week 1): LTG=STG 2/2 ELOS  Skilled Therapeutic Interventions/Progress Updates:    Pt seen this session to review his progress and current deficits as he is leaving to go home tomorrow.  Pt is now mod I to do basic transfers, to walk to bathroom, toilet, but should have Supervision with bathing in the shower and dressing.  Discussed this and how he should have his family with him to prevent falls.   Spent time discussing that he should not drive for 6 months and will need to be seen by his physician first before driving and also by an optometrist as he has a L visual field cut.   Spent time discussing his peripheral field loss and compensatory strategies.  Recommended he have supervision with all work related and IADL tasks.   Pt nodded his head and engaged in the conversation as if he understood but it is highly likely he will not recall the information due to memory deficits.  No family or caregivers present for family education.  Speech therapy will meet with family tomorrow for education on cognitive deficits.   Pt resting in bed with all needs met.    Therapy Documentation Precautions:  Precautions Precautions: Fall Precaution Comments: L hemianopsia Restrictions Weight Bearing Restrictions: No Vital Signs: Therapy Vitals Temp: 98.3 F (36.8 C) Temp Source: Oral Pulse Rate: 81 Resp: 14 BP: 90/73 Patient Position (if appropriate): Lying Oxygen Therapy SpO2: (!) 86 % O2 Device: Room Air Pain: Pain Assessment Pain Scale: 0-10 Pain Score: 0-No pain ADL: ADL Eating: Independent Where Assessed-Eating: Edge of bed Grooming: Independent Where Assessed-Grooming: Standing at sink Upper Body Bathing:  Supervision/safety Where Assessed-Upper Body Bathing: Shower Lower Body Bathing: Supervision/safety Where Assessed-Lower Body Bathing: Shower Upper Body Dressing: Setup Where Assessed-Upper Body Dressing: Edge of bed Lower Body Dressing: Supervision/safety Where Assessed-Lower Body Dressing: Edge of bed Toileting: Contact guard Where Assessed-Toileting: Glass blower/designer: Diplomatic Services operational officer Method: Counselling psychologist: Other (comment)(None) Clinical cytogeneticist Method: Optometrist: Walk in Retail buyer: Close supervision Social research officer, government Method: Stand pivot, Ambulating   Therapy/Group: Individual Therapy  [place] 03/07/2020, 3:21 PM

## 2020-03-07 NOTE — Progress Notes (Signed)
Pt's family called unit stating they are coming to pick up pt. Linna Hoff, [place] , Dr. Posey Pronto and Erlene Quan, social work are aware. Spoke with pt and updated on planned discharge date. Pt agreed to stay until tomorrow. Pt stated that if not discharged by tomorrow he "will get a gun." pt requested anxiety med. one time dose of xanax obtained from [place], [place]. Family is aware that pt will not discharge today, and informed them to come in for education with therapy. Pt calm and cooperative, currently resting in bed.   Gerald Stabs, RN

## 2020-03-07 NOTE — Progress Notes (Signed)
Patient ID: Christian [name], male   DOB: 17-Jan-1941, 79 y.o.   MRN: [id number]  Patient friend set for family education today, agreed to come. Didn't show to session, no answer to follow up. Follow continue to follow up before discharge. ST would like to see family before discharge for education.

## 2020-03-07 NOTE — Patient Care Conference (Signed)
Inpatient RehabilitationTeam Conference and Plan of Care Update Date: 03/07/2020   Time: 3:09 PM    Patient Name: Christian [name]      Medical Record Number: [id number]  Date of Birth: 10/07/1941 Sex: Male         Room/Bed: 4W12C/4W12C-01 Payor Info: Payor: [place] / Plan: [facility] MEDICARE / Product Type: *No Product type* /    Admit Date/Time:  03/01/2020  5:28 PM  Primary Diagnosis:  Acute ischemic right posterior cerebral artery (PCA) stroke [facility])  Patient Active Problem List   Diagnosis Date Noted  . Stage 2 chronic kidney disease   . Benign essential HTN   . Abdominal cramping   . Diabetes mellitus type 2 in nonobese (HCC)   . Chronic systolic congestive heart failure (Christian [name])   . Acute ischemic right posterior cerebral artery (PCA) stroke (Christian [name]) 03/01/2020  . Acute ischemic stroke (Christian [name]) 02/26/2020  . Allergic rhinitis due to pollen 03/29/2018  . Oropharyngeal dysphagia 01/11/2018  . GERD with esophagitis 01/11/2018  . Claudication of both lower extremities (Christian [name]) 01/06/2018  . Nonischemic cardiomyopathy (Christian [name]) 11/11/2017  . Coronary artery disease involving native coronary artery of native heart without angina pectoris 11/11/2017  . Chronic systolic heart failure (Christian [name])   . Essential hypertension 07/08/2017  . Depression with anxiety 07/08/2017  . Type 2 diabetes mellitus with complication, without long-term current use of insulin (Christian [name]) 07/08/2017  . Hyperlipidemia LDL goal <130 05/25/2017    Expected Discharge Date: Expected Discharge Date: 03/08/20  Team Members Present: Physician leading conference: Dr. Delice Lesch Care Coodinator Present: Nestor Lewandowsky, RN, BSN, CRRN;Christina Sampson Goon, BSW Nurse Present: Other (comment)(Tomeka Pugh) PT Present: Deniece Ree, PT OT Present: Meriel Pica, OT SLP Present: Jettie Booze, CF-SLP PPS Coordinator present : Gunnar Fusi, SLP     Current Status/Progress Goal Weekly Team Focus  Bowel/Bladder   Continent of  bowel and bladder. Last continent episode 03/07/20.  To maintain continence of bowel and bladder.  Maintaining consistent bowel and bladder control.   Swallow/Nutrition/ Hydration             ADL's   CGA overall due to decreased standing balance  Supervision overall, except for toilet transfers and toileting  ADL training, balance, functional mobility, cognition, pt/family education   Mobility   S-min guard for all levels of mobility, 24 steps with U rail in min guard, no device  mod(I)  high level gait and balance, functional activity tolerance   Communication             Safety/Cognition/ Behavioral Observations  Mod A problem solving, Min-Mod recall, attention, emergent awareness  Min  problem solving, education, memory, selective attention, emergent awareness   Pain   Currently denies pain  Continues to deny pain  Pain level sufficient for proper pain control and management   Skin   No known skin issues at this time  Maintain skin integrity  Maintain skin integrity    Rehab Goals Patient on target to meet rehab goals: Yes Rehab Goals Revised: on target with current goals *See Care Plan and progress notes for long and short-term goals.     Barriers to Discharge  Current Status/Progress Possible Resolutions Date Resolved   Nursing                  PT  Lack of/limited family support  on track to meet goals but wanting to leave early [facility]              OT  SLP                Care Coordinator Lack of/limited family support patient lives alone and plans to return to work at discharge on target          Discharge Planning/Teaching Needs:  goal to discharge home  will schuedule if needed, friends willing to participate   Team Discussion:  Patient is anxious to go home ASAP; refusing medications. Patient reported abd. Pain and inability to get comfortable. Team members noted cognitive issues that are not in alignment with a stroke and question premorbid cognitive  status as visitor did not acknowledge changes in cognition in the evening and OCD tendencies. Not all disciplines have evaluated the patient at the time of conference.   Revisions to Treatment Plan:  Fast track discharge per patient request. Schedule family education ASAP and plan discharge home 03/08/20.    Medical Summary Current Status: Deficits of dizziness secondary to right PCA acute infarct with severe stenosis of proximal mid right P2 segment, with markedly attenuated flow within the right PCA distally.  Moderate to severe proximal left P2 stenosis Weekly Focus/Goal: Improve mobility, blood pressure, abdominal cramps,?  AKI versus CKD, impulsiveness, follow weights  Barriers to Discharge: Behavior;Medical stability   Possible Resolutions to Barriers: Therapies, monitor blood pressure, follow labs, continue to educate, follow weights   Continued Need for Acute Rehabilitation Level of Care: The patient requires daily medical management by a physician with specialized training in physical medicine and rehabilitation for the following reasons: Direction of a multidisciplinary physical rehabilitation program to maximize functional independence : Yes Medical management of patient stability for increased activity during participation in an intensive rehabilitation regime.: Yes Analysis of laboratory values and/or radiology reports with any subsequent need for medication adjustment and/or medical intervention. : Yes   I attest that I was present, lead the team conference, and concur with the assessment and plan of the team.   [name] 03/07/2020, 3:09 PM

## 2020-03-07 NOTE — Progress Notes (Signed)
Occupational Therapy Discharge Summary  Patient Details  Name: Christian [name] MRN: [id number] Date of Birth: 1941/10/06     Patient has met 10 of 10 long term goals due to improved activity tolerance, improved balance, postural control, ability to compensate for deficits and improved coordination.  Patient to discharge at overall Supervision level.  Patient's care partner is independent to provide the necessary physical and cognitive assistance at discharge.    Reasons goals not met: n/a  Recommendation:  No further OT services recommended.  Equipment: No equipment provided  Reasons for discharge: treatment goals met  Patient/family agrees with progress made and goals achieved: Yes - Spent time with patient discussing his peripheral field loss and compensatory strategies.  Recommended he have supervision with all work related and IADL tasks.   Pt nodded his head and engaged in the conversation as if he understood but it is highly likely he will not recall the information due to memory deficits.  No family or caregivers present for family education.  Speech therapy will meet with family tomorrow for education on cognitive deficits.   OT Discharge Precautions/Restrictions  Precautions Precautions: Fall Precaution Comments: L hemianopsia Restrictions Weight Bearing Restrictions: No ADL ADL Eating: Independent Where Assessed-Eating: Edge of bed Grooming: Independent Where Assessed-Grooming: Standing at sink Upper Body Bathing: Supervision/safety Where Assessed-Upper Body Bathing: Shower Lower Body Bathing: Supervision/safety Where Assessed-Lower Body Bathing: Shower Upper Body Dressing: Setup Where Assessed-Upper Body Dressing: Edge of bed Lower Body Dressing: Supervision/safety Where Assessed-Lower Body Dressing: Edge of bed Toileting: Contact guard Where Assessed-Toileting: Glass blower/designer: Diplomatic Services operational officer Method: Information systems manager: Other (comment)(None) Clinical cytogeneticist Method: Optometrist: Walk in Retail buyer: Close supervision Social research officer, government Method: Stand pivot, Ambulating Vision Baseline Vision/History: Wears glasses Wears Glasses: At all times Patient Visual Report: Peripheral vision impairment;Other (comment) Vision Assessment?: Yes Eye Alignment: Within Functional Limits Alignment/Gaze Preference: Within Defined Limits Saccades: Other (comment) Visual Fields: Left homonymous hemianopsia Perception  Perception: Impaired Inattention/Neglect: Does not attend to left visual field(needs occasional cues to find items in L visual field) Praxis Praxis: Intact Cognition Overall Cognitive Status: Impaired/Different from baseline Arousal/Alertness: Awake/alert Orientation Level: Oriented to person;Oriented to place;Oriented to situation;Disoriented to time Attention: Selective Focused Attention: Appears intact Sustained Attention: Appears intact Selective Attention: Impaired Awareness: Impaired Problem Solving: Impaired Reasoning: Impaired Organizing: Impaired Self Monitoring: Impaired Self Correcting: Impaired Behaviors: Poor frustration tolerance;Perseveration;Lability Safety/Judgment: Impaired Comments: left inattention, poor safety awareness and memory in general Sensation Sensation Light Touch: Appears Intact Hot/Cold: Appears Intact Proprioception: Appears Intact Stereognosis: Appears Intact Coordination Gross Motor Movements are Fluid and Coordinated: Yes Fine Motor Movements are Fluid and Coordinated: No Coordination and Movement Description: decreased bilaterally- slight ataxia in [facility] Motor  Motor Motor: Within Functional Limits Mobility  Bed Mobility Bed Mobility: Rolling Right;Rolling Left;Supine to Sit;Sit to Supine Rolling Right: Independent Rolling Left: Independent Supine to Sit: Independent Sit to Supine:  Independent Transfers Sit to Stand: Independent with assistive device Stand to Sit: Independent with assistive device  Trunk/Postural Assessment  Cervical Assessment Cervical Assessment: Exceptions to WFL(forward head) Thoracic Assessment Thoracic Assessment: Exceptions to WFL(rounded shoulders) Postural Control Postural Control: Within Functional Limits  Balance Balance Balance Assessed: Yes Static Sitting Balance Static Sitting - Level of Assistance: 7: Independent Dynamic Sitting Balance Dynamic Sitting - Level of Assistance: 7: Independent Static Standing Balance Static Standing - Level of Assistance: 7: Independent Dynamic Standing Balance Dynamic Standing - Level of Assistance: 6: Modified independent (Device/Increase time) Extremity/Trunk  Assessment RUE Assessment RUE Assessment: Within Functional Limits LUE Assessment LUE Assessment: Within Functional Limits   Leander Tout 03/07/2020, 3:28 PM

## 2020-03-07 NOTE — Progress Notes (Signed)
Physical Therapy Session Note  Patient Details  Name: Christian [name] MRN: [id number] Date of Birth: 12/04/1940  Today's Date: 03/07/2020 PT Individual Time: 1035-1108 PT Individual Time Calculation (min): 33 min   Short Term Goals: Week 1:  PT Short Term Goal 1 (Week 1): STG+LTGs due to LOS.  Skilled Therapeutic Interventions/Progress Updates: Pt presented in bed agreeable to therapy with pt expressing frustration about desire to d/c. PTA if currently pt felt had any barriers physically at home with pt denied. Session therefore focused on functional activities to simulate home environment. Performed bed mobility supervision with use of bed features and ambulated no AD with supervision to ADL apt. Once vacuum off wall pt was able to safely plug into wall, manage controls without assist and vacuum room with supervision while safely managing power cord. Pt then expressed desire to urinate, pt used bathroom in ADL with supervision and performed toilet transfers with supervision (+void). En route to return to room pt carried laundry basket with 20# weight from bench to high/low table to simulate carrying dog food as pt states does not carry around in kennel which pt was able to perform with supervision and safely manage. Discussed with PTA performing floor transfer at next PT session to assure that pt can safely manage if fall occurred at home to which pt seemed agreeable to. Pt ambulated back to room however needed mod cues to locate room when given room number and indicated use of wall signs. Pt returned to bed at end of session with bed alarm on, call bell within reach and needs met.      Therapy Documentation Precautions:  Precautions Precautions: Fall Precaution Comments: L hemianopsia Restrictions Weight Bearing Restrictions: No General:   Vital Signs:   Pain: Pain Assessment Pain Scale: 0-10 Pain Score: 0-No pain Mobility: Bed Mobility Bed Mobility: Rolling Right;Rolling Left;Supine  to Sit;Sit to Supine Rolling Right: Independent Rolling Left: Independent Supine to Sit: Independent Sit to Supine: Independent Transfers Transfers: Sit to Stand;Stand to Sit;Stand Pivot Transfers Sit to Stand: Independent with assistive device Stand to Sit: Independent with assistive device Stand Pivot Transfers: Independent with assistive device Transfer (Assistive device): None Locomotion : Gait Gait Assistance: Independent Gait Distance (Feet): 150 Feet Assistive device: None Gait Gait: Yes Gait Pattern: Impaired Gait Pattern: Step-through pattern;Decreased step length - left;Decreased step length - right;Decreased dorsiflexion - right;Decreased dorsiflexion - left;Decreased weight shift to right;Decreased weight shift to left;Trendelenburg;Decreased trunk rotation;Trunk flexed;Wide base of support Stairs / Additional Locomotion Stairs: Yes Stairs Assistance: Independent with assistive device Stair Management Technique: Alternating pattern;One rail Left Number of Stairs: 12 Height of Stairs: 6 Ramp: Independent Curb: Independent with assistive device Wheelchair Mobility Wheelchair Mobility: No  Trunk/Postural Assessment : Cervical Assessment Cervical Assessment: Exceptions to WFL(forward head) Thoracic Assessment Thoracic Assessment: Exceptions to WFL(rounded shoulders) Postural Control Postural Control: Within Functional Limits  Balance: Balance Balance Assessed: Yes Static Sitting Balance Static Sitting - Level of Assistance: 7: Independent Dynamic Sitting Balance Dynamic Sitting - Level of Assistance: 7: Independent Static Standing Balance Static Standing - Level of Assistance: 7: Independent Dynamic Standing Balance Dynamic Standing - Level of Assistance: 6: Modified independent (Device/Increase time) Exercises:   Other Treatments:      Therapy/Group: Individual Therapy    03/07/2020, 1:17 PM  

## 2020-03-07 NOTE — Progress Notes (Signed)
Physical Therapy Session Note  Patient Details  Name: Laurens Matheny MRN: [id number] Date of Birth: Jul 13, 1941  Today's Date: 03/07/2020 PT Individual Time: 1500-1525 PT Individual Time Calculation (min): 25 min   Short Term Goals: Week 1:  PT Short Term Goal 1 (Week 1): STG+LTGs due to LOS.  Skilled Therapeutic Interventions/Progress Updates:    Patient received in bed, willing to work with therapy. Able to complete all functional mobility with modified independence including bed mobility, functional transfers, and gait >185f without device this afternoon. Continued challenging balance via ambulation with quick turns and u-turns, functional strength via sit <->stand without UEs, and practiced obstacle course primarily involving stepping over cones and horseshoes. Returned to his room and he was left in bed with all needs met, bed alarm active, and RN aware of request for medicine for gas pains.   Therapy Documentation Precautions:  Precautions Precautions: Fall Precaution Comments: L hemianopsia Restrictions Weight Bearing Restrictions: No   Pain 0/10    Therapy/Group: Individual Therapy   KWindell Norfolk DPT, PN1   Supplemental Physical Therapist [place]   Pager [phone] Rehab Office [phone]   03/07/2020, 3:48 PM

## 2020-03-07 NOTE — Progress Notes (Signed)
[facility] PHYSICAL MEDICINE & REHABILITATION PROGRESS NOTE  Subjective/Complaints: Patient seen sitting up this morning. He states he did not sleep well overnight because his back was itching. Overnight per report also noted that patient wanted to be discharged, however patient calmed down after speaking with his wife in education. Discussed patient's desire to go home with therapies as well. This morning patient does not recall me and states the same thing he does every morning, "oh, I your Dr. Posey Pronto". He states that if he is not discharged today, he is leaving AMA.  ROS: Denies CP, SOB, N/V/D  Objective: Vital Signs: Blood pressure 100/71, pulse 77, temperature 97.6 F (36.4 C), resp. rate 18, weight 75.6 kg, SpO2 99 %. No results found. No results for input(s): WBC, HGB, HCT, PLT in the last 72 hours. Recent Labs    03/06/20 0951  NA 139  K 4.4  CL 100  CO2 27  GLUCOSE 140*  BUN 16  CREATININE 1.20  CALCIUM 9.2    Physical Exam: BP 100/71 (BP Location: Right Arm)   Pulse 77   Temp 97.6 F (36.4 C)   Resp 18   Wt 75.6 kg   SpO2 99%   BMI 26.90 kg/m   Constitutional: No distress . Vital signs reviewed. HENT: Normocephalic.  Atraumatic. HOH Eyes: EOMI. No discharge. Cardiovascular: No JVD. Respiratory: Normal effort.  No stridor. GI: Non-distended. Skin: Warm and dry.  Intact. Psych: Easily agitated. Musc: No edema in extremities.  No tenderness in extremities. Neuro: Alert Motor: 5/5 throughout, unchanged Right upper extremity: Mild dysmetria  Assessment/Plan: 1. Functional deficits secondary to acute right PCA infarct which require 3+ hours per day of interdisciplinary therapy in a comprehensive inpatient rehab setting.  Physiatrist is providing close team supervision and 24 hour management of active medical problems listed below.  Physiatrist and rehab team continue to assess barriers to discharge/monitor patient progress toward functional and medical  goals  Care Tool:  Bathing    Body parts bathed by patient: Right arm, Left arm, Chest, Abdomen, Front perineal area, Buttocks, Right upper leg, Left upper leg, Right lower leg, Left lower leg, Face         Bathing assist Assist Level: Contact Guard/Touching assist     Upper Body Dressing/Undressing Upper body dressing   What is the patient wearing?: Pull over shirt    Upper body assist Assist Level: Supervision/Verbal cueing    Lower Body Dressing/Undressing Lower body dressing      What is the patient wearing?: Pants     Lower body assist Assist for lower body dressing: Minimal Assistance - Patient > 75%     Toileting Toileting Toileting Activity did not occur (Clothing management and hygiene only): N/A (no void or bm)  Toileting assist Assist for toileting: Contact Guard/Touching assist Assistive Device Comment: urinal   Transfers Chair/bed transfer  Transfers assist     Chair/bed transfer assist level: Contact Guard/Touching assist     Locomotion Ambulation   Ambulation assist      Assist level: Contact Guard/Touching assist Assistive device: No Device Max distance: >150'   Walk 10 feet activity   Assist     Assist level: Contact Guard/Touching assist Assistive device: No Device   Walk 50 feet activity   Assist    Assist level: Contact Guard/Touching assist Assistive device: No Device    Walk 150 feet activity   Assist Walk 150 feet activity did not occur: Safety/medical concerns  Assist level: Contact Guard/Touching assist Assistive  device: No Device    Walk 10 feet on uneven surface  activity   Assist Walk 10 feet on uneven surfaces activity did not occur: Safety/medical concerns         Wheelchair     Assist Will patient use wheelchair at discharge?: No Type of Wheelchair: Manual    Wheelchair assist level: Contact Guard/Touching assist Max wheelchair distance: 50    Wheelchair 50 feet with 2 turns  activity    Assist        Assist Level: Contact Guard/Touching assist   Wheelchair 150 feet activity     Assist Wheelchair 150 feet activity did not occur: Safety/medical concerns          Medical Problem List and Plan: 1.  Deficits of dizziness secondary to right PCA acute infarct with severe stenosis of proximal mid right P2 segment, with markedly attenuated flow within the right PCA distally.  Moderate to severe proximal left P2 stenosis  Continue CIR   Team conference today to discuss current and goals and coordination of care, home and environmental barriers, and discharge planning with nursing, case manager, and therapies.  2.  Antithrombotics: -DVT/anticoagulation: Lovenox initiated 02/29/2020             -antiplatelet therapy: Aspirin 81 mg daily and Plavix 75 mg daily x3 months then aspirin alone 3. Pain Management: Tylenol as needed.  Monitor for headaches with increased exertion.  Controlled on 5/19 4. Mood: Provide emotional support             -antipsychotic agents: N/A 5. Neuropsych: This patient is capable of making decisions on his own behalf. 6. Skin/Wound Care: Routine skin checks 7. Fluids/Electrolytes/Nutrition: Routine in and outs.  BMP within acceptable range on 5/18 8.  Chronic systolic congestive heart failure.  Lasix 20 mg 3 times weekly Monday Wednesday Friday, Entresto 24-26 mg twice daily.  Monitor for any signs of fluid overload Filed Weights   03/06/20 0439 03/07/20 0500  Weight: 75.6 kg 75.6 kg    Stable on 5/19 9.  Hypertension.    Coreg 6.25 mg twice daily, decreased to 3.125 daily  Controlled on 5/19  Monitor with increased mobility 10.  CAD/nonischemic cardiomyopathy.  Continue Coreg 11.  Diet-controlled diabetes mellitus.  Latest hemoglobin A1c 6.4.    Controlled, CBGs d/ced  Monitor with increased mobility 12.  Hyperlipidemia.  Crestor 5mg . Do not increase dose given side effects of myalgias.  13.  GERD.  Nexium with  benefit 14. Abdominal cramps  Simethicone started on 5/17  Improved 15. ?CKD II  Encourage fluids  LOS: 6 days A FACE TO FACE EVALUATION WAS PERFORMED  Marquel Spoto Lorie Phenix 03/07/2020, 8:21 AM

## 2020-03-07 NOTE — Plan of Care (Signed)
  Problem: Consults Goal: RH STROKE PATIENT EDUCATION Description: See Patient Education module for education specifics  Outcome: Progressing   Problem: RH SAFETY Goal: RH STG ADHERE TO SAFETY PRECAUTIONS W/ASSISTANCE/DEVICE Description: STG Adhere to Safety Precautions With cues/reminders Assistance/Device. Outcome: Progressing   Problem: RH KNOWLEDGE DEFICIT Goal: RH STG INCREASE KNOWLEDGE OF HYPERTENSION Description: Pt will be able to demonstrate understanding of HTN management with medication compliance and diet control using handouts/booklets with mod I assist prior to DC Outcome: Progressing Goal: RH STG INCREASE KNOWLEGDE OF HYPERLIPIDEMIA Description: Pt will be able to demonstrate understanding of HLD management with medication compliance and diet control using handouts/booklets with mod I assist prior to DC Outcome: Progressing Goal: RH STG INCREASE KNOWLEDGE OF STROKE PROPHYLAXIS Description: Pt will be able to demonstrate understanding of stroke prevention with medication compliance and diet control using handouts/booklets with mod I assist prior to DC Outcome: Progressing   Problem: RH Vision Goal: RH LTG Vision (Specify) Outcome: Progressing

## 2020-03-07 NOTE — Progress Notes (Signed)
Speech Language Pathology Daily Session Note  Patient Details  Name: Christian [name] MRN: [id number] Date of Birth: [birth year]  Today's Date: 03/07/2020 SLP Individual Time: 1416-1500 SLP Individual Time Calculation (min): 44 min  Short Term Goals: Week 1: SLP Short Term Goal 1 (Week 1): STG=LTG due to ELOS  Skilled Therapeutic Interventions: Pt was seen for skilled ST targeting cognition. Although family was scheduled to attend family education this afternoon regarding pt's cognitive status, they were not present. Attempted to call ex-wife (caregiver) with no answer. Attempting to reschedule education prior to discharge tomorrow morning. 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 Pt with poor insight into his performance and denies cognitive deficits in general. Pt left laying in bed with alarm set and needs within reach. Continue per current plan of care.        Pain Pain Assessment Pain Scale: 0-10 Pain Score: 0-No pain  Therapy/Group: Individual Therapy  Arbutus Leas 03/07/2020, 7:29 AM

## 2020-03-07 NOTE — Progress Notes (Signed)
Patient ID: Christian [name], male   DOB: June 25, 1941, 79 y.o.   MRN: [id number]   SW left voicemail. ST will attempt education again at 8:45 AM before discharge

## 2020-03-07 NOTE — Progress Notes (Signed)
Physical Therapy Session Note  Patient Details  Name: Christian [name] MRN: [id number] Date of Birth: [birth year]  Today's Date: 03/07/2020 PT Individual Time: 2897-9150 PT Individual Time Calculation (min): 43 min   Short Term Goals: Week 1:  PT Short Term Goal 1 (Week 1): STG+LTGs due to LOS.  Skilled Therapeutic Interventions/Progress Updates:    Patient received in bed, immediately stating "you guys are meeting about me today right? Someone will talk to me after? I'm telling you I'd rather be dead than be here". Able to focus patient on therapy session this morning and practiced all aspects of functional mobility- able to perform all aspects of mobility with Mod(I) today but did require min guard for ambulation over mulch/uneven surfaces. Tolerated riding Nustep for 8 minutes with BLEs on resistance 4 today. Repeatedly asked therapist about what time his PT this afternoon is and was re-educated appropriately multiple times during session. He continues to emphasize that he is leaving soon no matter what. Refused up to [facility], left up in bed with all needs met and bed alarm active.   Therapy Documentation Precautions:  Precautions Precautions: Fall Precaution Comments: L hemianopsia Restrictions Weight Bearing Restrictions: No    Pain: Pain Assessment Pain Scale: 0-10 Pain Score: 0-No pain    Therapy/Group: Individual Therapy   Windell Norfolk, DPT, PN1   Supplemental Physical Therapist [place]    Pager [phone] Acute Rehab Office [phone]    03/07/2020, 12:06 PM

## 2020-03-07 NOTE — Progress Notes (Signed)
Team Conference Report to Patient/Family  Team Conference discussion was reviewed with the patient and caregiver, including goals, any changes in plan of care and target discharge date.  Patient and caregiver express understanding and are in agreement.  The patient has a target discharge date of 03/08/20   [name] 03/07/2020, 2:50 PM Patient ID: Christian [name], male   DOB: Jun 09, 1941, 79 y.o.   MRN: [id number]

## 2020-03-08 ENCOUNTER — Encounter (HOSPITAL_COMMUNITY): Payer: Medicare Other | Admitting: Speech Pathology

## 2020-03-08 DIAGNOSIS — H1013 Acute atopic conjunctivitis, bilateral: Secondary | ICD-10-CM

## 2020-03-08 LAB — CREATININE, SERUM
Creatinine, Ser: 1.11 mg/dL (ref 0.61–1.24)
GFR calc Af Amer: >60 mL/min (ref >60)
GFR calc non Af Amer: >60 mL/min (ref >60)

## 2020-03-08 NOTE — Progress Notes (Signed)
Speech Language Pathology Discharge Summary  Patient Details  Name: Christian [name] MRN: [id number] Date of Birth: 09-08-1941  Today's Date: 03/08/2020 SLP Individual Time: 3343-5686 SLP Individual Time Calculation (min): 27 min   Skilled Therapeutic Interventions:  Pt was seen for skilled ST targeting education - second attempt to complete education with family, however family did not attend scheduled session today. Discussed impact of impaired problem solving, organization, and short term memory on daily functioning with pt, as well as compensatory memory strategies. Also left a handout and written notes with recommendations emphasizing assistance to be provided with finance, medication management, and other complex ADLs due to cognitive impairments. Notes and handout left with pt's belongings for family to take home. Pt left in bed with alarm set and needs met to his satisfaction. Continue per current plan of care.      Patient has met 2 of 4 long term goals.  Patient to discharge at overall Min;Mod level.  Reasons goals not met: increased moderate cues required for emergent awareness and use of compensatory memory strategies   Clinical Impression/Discharge Summary:   Pt made minimal functional gains and met 1 out of 3 long term goals this admission. Pt currently requires Mod assist for semi-complex cognitive tasks due to impairments impacting his problem solving abilities, short term memory, and emergent awareness. Although he can problem solve basic familiar tasks with Min A, increased cueing is required for any task of mild complexity. As a result, recommend 24/7 supervision. Pt has demonstrated improved selective attention to tasks and basic familiar problem solving.  However, given cognitive deficits still present, recommend pt continue to receive skilled ST services upon discharge. Pt education is complete at this time. Although family education was scheduled and attempted X2, no family was  present for any formal education regarding pt's cognition.     Care Partner:  Caregiver Able to Provide Assistance: Yes  Type of Caregiver Assistance: Cognitive  Recommendation:  Home Health SLP;Outpatient SLP;24 hour supervision/assistance  Rationale for SLP Follow Up: Maximize cognitive function and independence;Reduce caregiver burden   Equipment: none   Reasons for discharge: Discharged from hospital   Patient/Family Agrees with Progress Made and Goals Achieved: (no contact with family, depsite scheduled attempts)    Arbutus Leas 03/08/2020, 12:34 PM

## 2020-03-08 NOTE — Progress Notes (Signed)
Inpatient Rehab Care Coordinator Discharge Note Inpatient Rehabilitation Care Coordinator  Discharge Note  The overall goal for the admission was met for:   Discharge location: Yes, Home  Length of Stay: Yes, 7 Days  Discharge activity level: Yes  Home/community participation: Yes  Services provided included: MD, RD, PT, OT, SLP, RN, CM, TR, Pharmacy, Neuropsych and SW  Financial Services: Private Insurance: [facility]  Follow-up services arranged: Home Health: [facility]  Comments (or additional information): PT OT ST  Patient/Family verbalized understanding of follow-up arrangements: Yes  Individual responsible for coordination of the follow-up plan: [name] [phone]  Confirmed correct DME delivered: [name] 03/08/2020    [name]

## 2020-03-08 NOTE — Progress Notes (Signed)
[place] PHYSICAL MEDICINE & REHABILITATION PROGRESS NOTE  Subjective/Complaints: Patient seen laying in bed this morning.  He initially states, "who are you".  He states he slept well overnight.  He states his back was itching, but it resolved with cream.  Encouraged to continue.  Prolonged discussion yesterday regarding patient's desire to discharge early.  Ultimately, patient agreed to stay until today, is eager for discharge today.  He is appreciative of his care.  ROS: Denies CP, SOB, N/V/D  Objective: Vital Signs: Blood pressure (!) 106/92, pulse 75, temperature 97.7 F (36.5 C), temperature source Oral, resp. rate 18, weight 76 kg, SpO2 97 %. No results found. No results for input(s): WBC, HGB, HCT, PLT in the last 72 hours. Recent Labs    03/06/20 0951  NA 139  K 4.4  CL 100  CO2 27  GLUCOSE 140*  BUN 16  CREATININE 1.20  CALCIUM 9.2    Physical Exam: BP (!) 106/92 (BP Location: Right Arm)   Pulse 75   Temp 97.7 F (36.5 C) (Oral)   Resp 18   Wt 76 kg   SpO2 97%   BMI 27.04 kg/m   Constitutional: No distress . Vital signs reviewed. HENT: Normocephalic.  Atraumatic. HOH Eyes: EOMI. No discharge. Cardiovascular: No JVD. Respiratory: Normal effort.  No stridor. GI: Non-distended. Skin: Warm and dry.  Intact. Psych: Normal mood.  Normal behavior this a.m. Musc: No edema in extremities.  No tenderness in extremities. Neuro: Alert Motor: 5/5 throughout, stable Right upper extremity: Mild dysmetria  Assessment/Plan: 1. Functional deficits secondary to acute right PCA infarct which require 3+ hours per day of interdisciplinary therapy in a comprehensive inpatient rehab setting.  Physiatrist is providing close team supervision and 24 hour management of active medical problems listed below.  Physiatrist and rehab team continue to assess barriers to discharge/monitor patient progress toward functional and medical goals  Care Tool:  Bathing    Body parts  bathed by patient: Right arm, Left arm, Chest, Abdomen, Front perineal area, Buttocks, Right upper leg, Left upper leg, Right lower leg, Left lower leg, Face         Bathing assist Assist Level: Supervision/Verbal cueing     Upper Body Dressing/Undressing Upper body dressing   What is the patient wearing?: Pull over shirt    Upper body assist Assist Level: Supervision/Verbal cueing    Lower Body Dressing/Undressing Lower body dressing      What is the patient wearing?: Pants     Lower body assist Assist for lower body dressing: Supervision/Verbal cueing     Toileting Toileting Toileting Activity did not occur (Clothing management and hygiene only): N/A (no void or bm)  Toileting assist Assist for toileting: Independent with assistive device Assistive Device Comment: urinal   Transfers Chair/bed transfer  Transfers assist     Chair/bed transfer assist level: Independent with assistive device Chair/bed transfer assistive device: Armrests   Locomotion Ambulation   Ambulation assist      Assist level: Independent Assistive device: No Device Max distance: >150'   Walk 10 feet activity   Assist     Assist level: Independent Assistive device: No Device   Walk 50 feet activity   Assist    Assist level: Independent Assistive device: No Device    Walk 150 feet activity   Assist Walk 150 feet activity did not occur: Safety/medical concerns  Assist level: Independent Assistive device: No Device    Walk 10 feet on uneven surface  activity  Assist Walk 10 feet on uneven surfaces activity did not occur: Safety/medical concerns   Assist level: Contact Guard/Touching assist Assistive device: Other (comment)(no device)   Wheelchair     Assist Will patient use wheelchair at discharge?: No(functional ambulator) Type of Wheelchair: Manual Wheelchair activity did not occur: N/A(functional ambulator)  Wheelchair assist level: Contact  Guard/Touching assist Max wheelchair distance: 50    Wheelchair 50 feet with 2 turns activity    Assist    Wheelchair 50 feet with 2 turns activity did not occur: N/A(functional ambulator)   Assist Level: Contact Guard/Touching assist   Wheelchair 150 feet activity     Assist Wheelchair 150 feet activity did not occur: N/A(functional ambulator)          Medical Problem List and Plan: 1.  Deficits of dizziness secondary to right PCA acute infarct with severe stenosis of proximal mid right P2 segment, with markedly attenuated flow within the right PCA distally.  Moderate to severe proximal left P2 stenosis  Continue CIR   DC today  Will see patient for transitional care management in 1-2 weeks post-discharge 2.  Antithrombotics: -DVT/anticoagulation: Lovenox initiated 02/29/2020             -antiplatelet therapy: Aspirin 81 mg daily and Plavix 75 mg daily x3 months then aspirin alone 3. Pain Management: Tylenol as needed.  Monitor for headaches with increased exertion.  Controlled on 5/20 4. Mood: Provide emotional support             -antipsychotic agents: N/A 5. Neuropsych: This patient is capable of making decisions on his own behalf. 6. Skin/Wound Care: Routine skin checks 7. Fluids/Electrolytes/Nutrition: Routine in and outs.  BMP within acceptable range on 5/18 8.  Chronic systolic congestive heart failure.  Lasix 20 mg 3 times weekly Monday Wednesday Friday, Entresto 24-26 mg twice daily.  Monitor for any signs of fluid overload Filed Weights   03/06/20 0439 03/07/20 0500 03/08/20 0551  Weight: 75.6 kg 75.6 kg 76 kg    Stable on 5/20 9.  Hypertension.    Coreg 6.25 mg twice daily, decreased to 3.125 daily  Controlled on 5/20  Monitor with increased mobility 10.  CAD/nonischemic cardiomyopathy.  Continue Coreg 11.  Diet-controlled diabetes mellitus.  Latest hemoglobin A1c 6.4.    Controlled, CBGs d/ced  Monitor with increased mobility 12.  Hyperlipidemia.   Crestor 5mg . Do not increase dose given side effects of myalgias.  13.  GERD.  Nexium with benefit 14. Abdominal cramps  Simethicone started on 5/17  Resolved 15. ?CKD II  Encourage fluids  Continue monitor as outpatient 16.  Dermatitis-on back  Continue moisturizing cream  > 30 minutes spent in total in discharge planning between myself and PA regarding aforementioned, as well discussion regarding DME equipment, follow-up appointments, follow-up therapies, discharge medications, discharge recommendations, repeated education regarding safety  LOS: 7 days A FACE TO FACE EVALUATION WAS PERFORMED  Francelia Mclaren Lorie Phenix 03/08/2020, 8:33 AM

## 2020-03-08 NOTE — Discharge Instructions (Signed)
Inpatient Rehab Discharge Instructions  Christian [name] Discharge date and time: No discharge date for patient encounter.   Activities/Precautions/ Functional Status: Activity: activity as tolerated Diet: diabetic diet Wound Care: none needed Functional status:  ___ No restrictions     ___ Walk up steps independently ___ 24/7 supervision/assistance   ___ Walk up steps with assistance ___ Intermittent supervision/assistance  ___ Bathe/dress independently ___ Walk with walker     _x__ Bathe/dress with assistance ___ Walk Independently    ___ Shower independently ___ Walk with assistance    ___ Shower with assistance ___ No alcohol     ___ Return to work/school ________ COMMUNITY REFERRALS UPON DISCHARGE:    Home Health:   PT     OT    ST                     Agency: [facility]  Phone: [phone]    Special Instructions: No driving smoking or alcohol  Continue aspirin and STROKE/TIA DISCHARGE INSTRUCTIONS SMOKING Cigarette smoking nearly doubles your risk of having a stroke & is the single most alterable risk factor  If you smoke or have smoked in the last 12 months, you are advised to quit smoking for your health.  Most of the excess cardiovascular risk related to smoking disappears within a year of stopping.  Ask you doctor about anti-smoking medications  [place] Quit Line: 1-800-QUIT NOW  Free Smoking Cessation Classes [phone]  CHOLESTEROL Know your levels; limit fat & cholesterol in your diet  Lipid Panel     Component Value Date/Time   CHOL 142 02/24/2020 1013   TRIG 114 02/24/2020 1013   HDL 47 02/24/2020 1013   CHOLHDL 3.0 02/24/2020 1013   CHOLHDL 4 07/08/2017 1212   VLDL 21.8 07/08/2017 1212   LDLCALC 74 02/24/2020 1013      Many patients benefit from treatment even if their cholesterol is at goal.  Goal: Total Cholesterol (CHOL) less than 160  Goal:  Triglycerides (TRIG) less than 150  Goal:  HDL greater than 40  Goal:  LDL (LDLCALC) less than 100   BLOOD PRESSURE American Stroke Association blood pressure target is less that 120/80 mm/Hg  Your discharge blood pressure is:  BP: 118/86  Monitor your blood pressure  Limit your salt and alcohol intake  Many individuals will require more than one medication for high blood pressure  DIABETES (A1c is a blood sugar average for last 3 months) Goal HGBA1c is under 7% (HBGA1c is blood sugar average for last 3 months)  Diabetes:    Lab Results  Component Value Date   HGBA1C 6.4 (A) 02/24/2020     Your HGBA1c can be lowered with medications, healthy diet, and exercise.  Check your blood sugar as directed by your physician  Call your physician if you experience unexplained or low blood sugars.  PHYSICAL ACTIVITY/REHABILITATION Goal is 30 minutes at least 4 days per week  Activity: Increase activity slowly, Therapies: Physical Therapy: Home Health Return to work:   Activity decreases your risk of heart attack and stroke and makes your heart stronger.  It helps control your weight and blood pressure; helps you relax and can improve your mood.  Participate in a regular exercise program.  Talk with your doctor about the best form of exercise for you (dancing, walking, swimming, cycling).  DIET/WEIGHT Goal is to maintain a healthy weight  Your discharge diet is:  Diet Order  Diet Carb Modified Fluid consistency: Thin; Room service appropriate? Yes  Diet effective now              liquids Your height is:    Your current weight is:   Your Body Mass Index (BMI) is:     Following the type of diet specifically designed for you will help prevent another stroke.  Your goal weight range is:    Your goal Body Mass Index (BMI) is 19-24.  Healthy food habits can help reduce 3 risk factors for stroke:  High cholesterol, hypertension, and excess weight.  RESOURCES Stroke/Support Group:  Call [phone]   STROKE EDUCATION PROVIDED/REVIEWED AND GIVEN TO PATIENT Stroke warning  signs and symptoms How to activate emergency medical system (call 911). Medications prescribed at discharge. Need for follow-up after discharge. Personal risk factors for stroke. Pneumonia vaccine given:  Flu vaccine given:  My questions have been answered, the writing is legible, and I understand these instructions.  I will adhere to these goals & educational materials that have been provided to me after my discharge from the hospital.    Plavix x3 months then aspirin alone   My questions have been answered and I understand these instructions. I will adhere to these goals and the provided educational materials after my discharge from the hospital.  Patient/Caregiver Signature _______________________________ Date __________  Clinician Signature _______________________________________ Date __________  Please bring this form and your medication list with you to all your follow-up doctor's appointments.

## 2020-03-08 NOTE — Plan of Care (Signed)
  Problem: Consults Goal: RH STROKE PATIENT EDUCATION Description: See Patient Education module for education specifics  03/08/2020 1041 by Renda Rolls L, LPN Outcome: Completed/Met 03/08/2020 1040 by Ferron Ishmael L, LPN Outcome: Progressing   Problem: RH SAFETY Goal: RH STG ADHERE TO SAFETY PRECAUTIONS W/ASSISTANCE/DEVICE Description: STG Adhere to Safety Precautions With cues/reminders Assistance/Device. 03/08/2020 1041 by Renda Rolls L, LPN Outcome: Completed/Met 03/08/2020 1040 by Renda Rolls L, LPN Outcome: Progressing   Problem: RH KNOWLEDGE DEFICIT Goal: RH STG INCREASE KNOWLEDGE OF HYPERTENSION Description: Pt will be able to demonstrate understanding of HTN management with medication compliance and diet control using handouts/booklets with mod I assist prior to DC 03/08/2020 1041 by Dewey Neukam L, LPN Outcome: Completed/Met 03/08/2020 1040 by Jahzier Villalon L, LPN Outcome: Progressing Goal: RH STG INCREASE KNOWLEGDE OF HYPERLIPIDEMIA Description: Pt will be able to demonstrate understanding of HLD management with medication compliance and diet control using handouts/booklets with mod I assist prior to DC 03/08/2020 1041 by Velva Molinari L, LPN Outcome: Completed/Met 03/08/2020 1040 by Xylia Scherger L, LPN Outcome: Progressing Goal: RH STG INCREASE KNOWLEDGE OF STROKE PROPHYLAXIS Description: Pt will be able to demonstrate understanding of stroke prevention with medication compliance and diet control using handouts/booklets with mod I assist prior to DC 03/08/2020 1041 by Oluwafemi [name] L, LPN Outcome: Completed/Met 03/08/2020 1040 by Nyonna Hargrove L, LPN Outcome: Progressing   Problem: RH Vision Goal: RH LTG Vision (Specify) 03/08/2020 1041 by Renda Rolls L, LPN Outcome: Completed/Met 03/08/2020 1040 by Renda Rolls L, LPN Outcome: Progressing

## 2020-03-09 DIAGNOSIS — I5022 Chronic systolic (congestive) heart failure: Secondary | ICD-10-CM | POA: Diagnosis not present

## 2020-03-09 DIAGNOSIS — M6281 Muscle weakness (generalized): Secondary | ICD-10-CM | POA: Diagnosis not present

## 2020-03-09 DIAGNOSIS — R2681 Unsteadiness on feet: Secondary | ICD-10-CM | POA: Diagnosis not present

## 2020-03-09 DIAGNOSIS — E119 Type 2 diabetes mellitus without complications: Secondary | ICD-10-CM | POA: Diagnosis not present

## 2020-03-09 DIAGNOSIS — N182 Chronic kidney disease, stage 2 (mild): Secondary | ICD-10-CM | POA: Diagnosis not present

## 2020-03-09 DIAGNOSIS — I1310 Hypertensive heart and chronic kidney disease without heart failure, with stage 1 through stage 4 chronic kidney disease, or unspecified chronic kidney disease: Secondary | ICD-10-CM | POA: Diagnosis not present

## 2020-03-09 DIAGNOSIS — I69398 Other sequelae of cerebral infarction: Secondary | ICD-10-CM | POA: Diagnosis not present

## 2020-03-09 DIAGNOSIS — I69318 Other symptoms and signs involving cognitive functions following cerebral infarction: Secondary | ICD-10-CM | POA: Diagnosis not present

## 2020-03-12 ENCOUNTER — Encounter: Payer: Self-pay | Admitting: *Deleted

## 2020-03-12 ENCOUNTER — Telehealth: Payer: Self-pay | Admitting: *Deleted

## 2020-03-12 NOTE — Telephone Encounter (Signed)
Transitional care call attempted 1st try.  Attempted to contact  [name] (assigned care coordinator) at [phone]  LVM to call us back to confirm appointment and answer transitional care questions

## 2020-03-14 DIAGNOSIS — N182 Chronic kidney disease, stage 2 (mild): Secondary | ICD-10-CM | POA: Diagnosis not present

## 2020-03-14 DIAGNOSIS — I5022 Chronic systolic (congestive) heart failure: Secondary | ICD-10-CM | POA: Diagnosis not present

## 2020-03-14 DIAGNOSIS — I1310 Hypertensive heart and chronic kidney disease without heart failure, with stage 1 through stage 4 chronic kidney disease, or unspecified chronic kidney disease: Secondary | ICD-10-CM | POA: Diagnosis not present

## 2020-03-14 DIAGNOSIS — I69398 Other sequelae of cerebral infarction: Secondary | ICD-10-CM | POA: Diagnosis not present

## 2020-03-14 DIAGNOSIS — I69318 Other symptoms and signs involving cognitive functions following cerebral infarction: Secondary | ICD-10-CM | POA: Diagnosis not present

## 2020-03-14 DIAGNOSIS — R2681 Unsteadiness on feet: Secondary | ICD-10-CM | POA: Diagnosis not present

## 2020-03-14 DIAGNOSIS — M6281 Muscle weakness (generalized): Secondary | ICD-10-CM | POA: Diagnosis not present

## 2020-03-14 DIAGNOSIS — E119 Type 2 diabetes mellitus without complications: Secondary | ICD-10-CM | POA: Diagnosis not present

## 2020-03-15 ENCOUNTER — Encounter: Payer: Medicare Other | Admitting: Physical Medicine & Rehabilitation

## 2020-03-15 DIAGNOSIS — I69398 Other sequelae of cerebral infarction: Secondary | ICD-10-CM | POA: Diagnosis not present

## 2020-03-15 DIAGNOSIS — R2681 Unsteadiness on feet: Secondary | ICD-10-CM | POA: Diagnosis not present

## 2020-03-15 DIAGNOSIS — E119 Type 2 diabetes mellitus without complications: Secondary | ICD-10-CM | POA: Diagnosis not present

## 2020-03-15 DIAGNOSIS — I69318 Other symptoms and signs involving cognitive functions following cerebral infarction: Secondary | ICD-10-CM | POA: Diagnosis not present

## 2020-03-15 DIAGNOSIS — I5022 Chronic systolic (congestive) heart failure: Secondary | ICD-10-CM | POA: Diagnosis not present

## 2020-03-15 DIAGNOSIS — I1310 Hypertensive heart and chronic kidney disease without heart failure, with stage 1 through stage 4 chronic kidney disease, or unspecified chronic kidney disease: Secondary | ICD-10-CM | POA: Diagnosis not present

## 2020-03-15 DIAGNOSIS — M6281 Muscle weakness (generalized): Secondary | ICD-10-CM | POA: Diagnosis not present

## 2020-03-15 DIAGNOSIS — N182 Chronic kidney disease, stage 2 (mild): Secondary | ICD-10-CM | POA: Diagnosis not present

## 2020-03-16 ENCOUNTER — Encounter: Payer: Self-pay | Admitting: Registered Nurse

## 2020-03-16 ENCOUNTER — Other Ambulatory Visit: Payer: Self-pay

## 2020-03-16 ENCOUNTER — Telehealth: Payer: Self-pay

## 2020-03-16 ENCOUNTER — Ambulatory Visit (INDEPENDENT_AMBULATORY_CARE_PROVIDER_SITE_OTHER): Payer: Medicare Other | Admitting: Registered Nurse

## 2020-03-16 VITALS — BP 108/72 | HR 75 | Temp 98.0°F

## 2020-03-16 DIAGNOSIS — H60393 Other infective otitis externa, bilateral: Secondary | ICD-10-CM

## 2020-03-16 MED ORDER — ACETIC ACID 2 % OT SOLN: 4.0000 [drp] | Freq: Three times a day (TID) | OTIC | 0 refills | Status: DC

## 2020-03-16 NOTE — Patient Instructions (Signed)
° ° ° °  If you have lab work done today you will be contacted with your lab results within the next 2 weeks.  If you have not heard from us then please contact us. The fastest way to get your results is to register for My Chart. ° ° °IF you received an x-ray today, you will receive an invoice from [facility]. Please contact [facility] at [phone] with questions or concerns regarding your invoice.  ° °IF you received labwork today, you will receive an invoice from [facility]. Please contact [facility] at [phone] with questions or concerns regarding your invoice.  ° °Our billing staff will not be able to assist you with questions regarding bills from these companies. ° °You will be contacted with the lab results as soon as they are available. The fastest way to get your results is to activate your My Chart account. Instructions are located on the last page of this paperwork. If you have not heard from us regarding the results in 2 weeks, please contact this office. °  ° ° ° °

## 2020-03-16 NOTE — Progress Notes (Signed)
Established Patient Office Visit  Subjective:  Patient ID: Christian [name], male    DOB: June 07, 1941  Age: 79 y.o. MRN: [id number]  CC:  Chief Complaint  Patient presents with  . Ear Pain    Patient states that he has been having some ear pain in both ears for 5 days thats causing him to develop an headache. Per patient he has not put anything in the ears since the pain started     HPI [name] presents for hospital follow up and bilateral ear pain.  Was seen here on 02/24/20 with concern for fatigue. Work up nonfocal but symptoms warranted concern for stroke - stat MRI and strict ER precautions ordered.  Pt presented to ED the next day with progressing symptoms. MRI revealed r rear ischemic stroke noted with small bleed. TPA not given.  Started on plavix and asa. Inpatient rehab for one week, then dc with home rehab and neurology follow up on 04/18/20  Today, c/o bilat ear pain and pressure. Ongoing for a few days, generally worsening. No drainage from ears. No change to hearing - poor hearing at baseline. No sinus pressure or pain.  Otherwise, notes ongoing posterior headache near area of ischemic stroke. No further neuro symptoms - denies changes to vision, short term memory improving, no weakness, numbness, loc. Headache is mild and ongoing, easily relieved with asa.    Past Medical History:  Diagnosis Date  . Allergic rhinitis due to pollen 03/29/2018  . Chronic systolic heart failure (HCC) - EF improved to normal on Rx    Echo 11/18: Mild LVH, EF 20, diffuse HK mild AI, a sending aorta 42 mm (mildly dilated), mild MR, mild TR, trivial PI, PASP 44 // Echo 3/19: mild LVH, EF 25-30, diff HK, Gr 1 DD, asc aorta 43 mm (mildly dilated), MAC, mild reduced RVSF // Limited echo 10/19: Mild concentric LVH, EF 53-74, grade 1 diastolic dysfunction, MAC, mild LAE   . Coronary artery disease involving native coronary artery of native heart without angina pectoris 11/11/2017   R/L HC 11/18: pLAD  45, oD1 65 ; elevated PCWP (mean 29), moderate pulmonary hypertension, CO 3.66, CI 2.07  . Depression with anxiety 07/08/2017  . Essential hypertension 07/08/2017  . GERD with esophagitis 01/11/2018  . Hernia of abdominal cavity   . Hyperlipidemia   . Hyperlipidemia LDL goal <130 05/25/2017   The 10-year ASCVD risk score Mikey Bussing DC Jr., et al., 2013) is: 31.1%   Values used to calculate the score:     Age: 89 years     Sex: Male     Is Non-Hispanic African American: No     Diabetic: No     Tobacco smoker: No     Systolic Blood Pressure: 827 mmHg     Is BP treated: No     HDL Cholesterol: 68.7 mg/dL     Total Cholesterol: 258 mg/dL   . Idiopathic inflammatory myopathy 05/25/2017  . Neuropathy involving both lower extremities 05/25/2017  . Nonischemic cardiomyopathy ([place]) 11/11/2017   LHC 09/16/17-nonobstructive CAD  . Oropharyngeal dysphagia 01/11/2018  . Type 2 diabetes mellitus with complication, without long-term current use of insulin ([place]) 07/08/2017    Past Surgical History:  Procedure Laterality Date  . BACK SURGERY    . HERNIA REPAIR    . RIGHT/LEFT HEART CATH AND CORONARY ANGIOGRAPHY N/A 09/16/2017   Procedure: RIGHT/LEFT HEART CATH AND CORONARY ANGIOGRAPHY;  Surgeon: Martinique, Peter M, MD;  Location: [facility];  Service:  Cardiovascular;  Laterality: N/A;  . SPINE SURGERY      Family History  Problem Relation Age of Onset  . Stroke Mother   . Kidney disease Father   . Stroke Father   . Hypertension Father   . Early death Father   . Cancer Neg Hx   . Alcohol abuse Neg Hx   . Diabetes Neg Hx   . Heart disease Neg Hx   . Hyperlipidemia Neg Hx     Social History   Socioeconomic History  . Marital status: Single    Spouse name: Not on file  . Number of children: Not on file  . Years of education: Not on file  . Highest education level: Not on file  Occupational History  . Not on file  Tobacco Use  . Smoking status: Never Smoker  . Smokeless tobacco: Never Used    Substance and Sexual Activity  . Alcohol use: No  . Drug use: No  . Sexual activity: Not Currently  Other Topics Concern  . Not on file  Social History Narrative  . Not on file   Social Determinants of Health   Financial Resource Strain:   . Difficulty of Paying Living Expenses:   Food Insecurity:   . Worried About Charity fundraiser in the Last Year:   . Arboriculturist in the Last Year:   Transportation Needs:   . Film/video editor (Medical):   Marland Kitchen Lack of Transportation (Non-Medical):   Physical Activity:   . Days of Exercise per Week:   . Minutes of Exercise per Session:   Stress:   . Feeling of Stress :   Social Connections:   . Frequency of Communication with Friends and Family:   . Frequency of Social Gatherings with Friends and Family:   . Attends Religious Services:   . Active Member of Clubs or Organizations:   . Attends Archivist Meetings:   Marland Kitchen Marital Status:   Intimate Partner Violence:   . Fear of Current or Ex-Partner:   . Emotionally Abused:   Marland Kitchen Physically Abused:   . Sexually Abused:     Outpatient Medications Prior to Visit  Medication Sig Dispense Refill  . acetaminophen (TYLENOL) 325 MG tablet Take 2 tablets (650 mg total) by mouth every 4 (four) hours as needed for mild pain (or temp > 37.5 C (99.5 F)).    Marland Kitchen aspirin EC 81 MG tablet Take 1 tablet (81 mg total) by mouth daily. 90 tablet 3  . carvedilol (COREG) 3.125 MG tablet Take 1 tablet (3.125 mg total) by mouth daily. 30 tablet 0  . cholecalciferol (VITAMIN D) 1000 units tablet Take 1 tablet (1,000 Units total) by mouth daily. 30 tablet 0  . clopidogrel (PLAVIX) 75 MG tablet Take 1 tablet (75 mg total) by mouth daily. 30 tablet 1  . esomeprazole (NEXIUM) 40 MG capsule Take 1 capsule (40 mg total) by mouth as needed (for heartburn). 30 capsule 5  . furosemide (LASIX) 40 MG tablet Take 0.5 tablets (20 mg total) by mouth 3 (three) times a week. 30 tablet 6  . olopatadine (PATANOL) 0.1  % ophthalmic solution Place 1 drop into both eyes as needed for allergies. 5 mL 2  . rosuvastatin (CRESTOR) 5 MG tablet Take 1 tablet (5 mg total) by mouth daily. 90 tablet 3  . sacubitril-valsartan (ENTRESTO) 24-26 MG Take 1 tablet by mouth 2 (two) times daily. 180 tablet 3   No facility-administered medications prior  to visit.    Allergies  Allergen Reactions  . Amoxicillin Hives  . Bactrim [Sulfamethoxazole-Trimethoprim] Other (See Comments)    Dried out his eyes  . Cephalexin Itching  . Hydrocodone Itching  . Lexapro [Escitalopram Oxalate] Other (See Comments)    disorientation  . Lipitor [Atorvastatin] Other (See Comments)    Damage to calf muscles  . Oxycodone Itching  . Statins Other (See Comments)    Muscle pain   . Vytorin [Ezetimibe-Simvastatin] Other (See Comments)    Damage to calf muscles    ROS Review of Systems  Constitutional: Negative.   HENT: Negative.   Eyes: Negative.   Respiratory: Negative.   Cardiovascular: Negative.   Gastrointestinal: Negative.   Endocrine: Negative.   Genitourinary: Negative.   Musculoskeletal: Negative.   Skin: Negative.   Allergic/Immunologic: Negative.   Neurological: Positive for headaches. Negative for dizziness, tremors, seizures, syncope, facial asymmetry, speech difficulty, weakness, light-headedness and numbness.  Hematological: Negative.   Psychiatric/Behavioral: Negative.   All other systems reviewed and are negative.     Objective:    Physical Exam  Constitutional: He is oriented to person, place, and time. He appears well-developed and well-nourished. No distress.  Cardiovascular: Normal rate, regular rhythm, normal heart sounds, intact distal pulses and normal pulses. Exam reveals no gallop and no friction rub.  No murmur heard. Pulmonary/Chest: Effort normal. No respiratory distress.  Musculoskeletal:        General: No tenderness, deformity or edema. Normal range of motion.  Neurological: He is alert and  oriented to person, place, and time. No cranial nerve deficit. Coordination normal.  Skin: Skin is warm, dry and intact. No rash noted. He is not diaphoretic. No cyanosis or erythema. No pallor. Nails show no clubbing.  Psychiatric: He has a normal mood and affect. His behavior is normal. Judgment and thought content normal.  Nursing note and vitals reviewed.   BP 108/72   Pulse 75   Temp 98 F (36.7 C) (Temporal)  Wt Readings from Last 3 Encounters:  03/08/20 167 lb 8.8 oz (76 kg)  02/25/20 165 lb (74.8 kg)  02/24/20 172 lb 3.2 oz (78.1 kg)     There are no preventive care reminders to display for this patient.  There are no preventive care reminders to display for this patient.  Lab Results  Component Value Date   TSH 1.650 02/24/2020   Lab Results  Component Value Date   WBC 6.6 03/02/2020   HGB 15.1 03/02/2020   HCT 46.5 03/02/2020   MCV 89.8 03/02/2020   PLT 216 03/02/2020   Lab Results  Component Value Date   NA 139 03/06/2020   K 4.4 03/06/2020   CO2 27 03/06/2020   GLUCOSE 140 (H) 03/06/2020   BUN 16 03/06/2020   CREATININE 1.11 03/08/2020   BILITOT 2.1 (H) 03/02/2020   ALKPHOS 38 03/02/2020   AST 17 03/02/2020   ALT 13 03/02/2020   PROT 6.1 (L) 03/02/2020   ALBUMIN 3.5 03/02/2020   CALCIUM 9.2 03/06/2020   ANIONGAP 12 03/06/2020   GFR 83.56 03/29/2018   Lab Results  Component Value Date   CHOL 142 02/24/2020   Lab Results  Component Value Date   HDL 47 02/24/2020   Lab Results  Component Value Date   LDLCALC 74 02/24/2020   Lab Results  Component Value Date   TRIG 114 02/24/2020   Lab Results  Component Value Date   CHOLHDL 3.0 02/24/2020   Lab Results  Component Value Date  HGBA1C 6.4 (A) 02/24/2020      Assessment & Plan:   Problem List Items Addressed This Visit    None    Visit Diagnoses    Infective otitis externa of both ears    -  Primary   Relevant Medications   acetic acid 2 % otic solution      Meds ordered  this encounter  Medications  . acetic acid 2 % otic solution    Sig: Place 4 drops into both ears 3 (three) times daily.    Dispense:  15 mL    Refill:  0    Order Specific Question:   Supervising Provider    Answer:   Forrest Moron [id number]    Follow-up: No follow-ups on file.   PLAN  vosol drops tid for otitis externa.  Pt appears to be holding steady since discharge. No urgent concerns today, though we will not ignore lingering headache. Reviewed in depth red flags and reasons to present to ER.   He should continue rehab at home and attend neuro follow up on 04/18/20  Follow up with myself in 2 mo.  Patient encouraged to call clinic with any questions, comments, or concerns.  Maximiano Coss, NP

## 2020-03-16 NOTE — Telephone Encounter (Signed)
Bradley,PTA form [facility] missed appt today due to nausea.

## 2020-03-18 ENCOUNTER — Other Ambulatory Visit: Payer: Self-pay | Admitting: Registered Nurse

## 2020-03-18 ENCOUNTER — Encounter: Payer: Self-pay | Admitting: Registered Nurse

## 2020-03-18 DIAGNOSIS — H60393 Other infective otitis externa, bilateral: Secondary | ICD-10-CM

## 2020-03-18 MED ORDER — ACETIC ACID 2 % OT SOLN: 4.0000 [drp] | Freq: Three times a day (TID) | OTIC | 0 refills | Status: DC

## 2020-03-19 ENCOUNTER — Other Ambulatory Visit: Payer: Self-pay | Admitting: Registered Nurse

## 2020-03-19 DIAGNOSIS — H1013 Acute atopic conjunctivitis, bilateral: Secondary | ICD-10-CM

## 2020-03-20 DIAGNOSIS — E119 Type 2 diabetes mellitus without complications: Secondary | ICD-10-CM | POA: Diagnosis not present

## 2020-03-20 DIAGNOSIS — I1310 Hypertensive heart and chronic kidney disease without heart failure, with stage 1 through stage 4 chronic kidney disease, or unspecified chronic kidney disease: Secondary | ICD-10-CM | POA: Diagnosis not present

## 2020-03-20 DIAGNOSIS — I69398 Other sequelae of cerebral infarction: Secondary | ICD-10-CM | POA: Diagnosis not present

## 2020-03-20 DIAGNOSIS — M6281 Muscle weakness (generalized): Secondary | ICD-10-CM | POA: Diagnosis not present

## 2020-03-20 DIAGNOSIS — N182 Chronic kidney disease, stage 2 (mild): Secondary | ICD-10-CM | POA: Diagnosis not present

## 2020-03-20 DIAGNOSIS — I5022 Chronic systolic (congestive) heart failure: Secondary | ICD-10-CM | POA: Diagnosis not present

## 2020-03-20 DIAGNOSIS — I69318 Other symptoms and signs involving cognitive functions following cerebral infarction: Secondary | ICD-10-CM | POA: Diagnosis not present

## 2020-03-20 DIAGNOSIS — R2681 Unsteadiness on feet: Secondary | ICD-10-CM | POA: Diagnosis not present

## 2020-03-22 ENCOUNTER — Telehealth: Payer: Self-pay

## 2020-03-22 ENCOUNTER — Encounter: Payer: Medicare Other | Admitting: Physical Medicine & Rehabilitation

## 2020-03-22 NOTE — Telephone Encounter (Signed)
Pt. Called requesting a medication for his anxiety. Pt. Was seen in the hospital recently and was prescribed an anxiety medication and was curious to see if that could be a regular prescription as the pt. Has had multiple anxiety events. Pt. Informed they may need to come in for an appt.   If approved please contact his wife Burnice Logan at [phone]

## 2020-03-23 ENCOUNTER — Other Ambulatory Visit: Payer: Self-pay

## 2020-03-23 ENCOUNTER — Ambulatory Visit (INDEPENDENT_AMBULATORY_CARE_PROVIDER_SITE_OTHER): Payer: Medicare Other | Admitting: Registered Nurse

## 2020-03-23 ENCOUNTER — Encounter: Payer: Self-pay | Admitting: Registered Nurse

## 2020-03-23 VITALS — BP 114/76 | HR 88 | Temp 97.8°F | Resp 17 | Ht 66.0 in | Wt 160.2 lb

## 2020-03-23 DIAGNOSIS — R21 Rash and other nonspecific skin eruption: Secondary | ICD-10-CM | POA: Diagnosis not present

## 2020-03-23 DIAGNOSIS — F411 Generalized anxiety disorder: Secondary | ICD-10-CM | POA: Diagnosis not present

## 2020-03-23 MED ORDER — ESCITALOPRAM OXALATE 5 MG PO TABS: 5.0000 mg | ORAL_TABLET | Freq: Every day | ORAL | 0 refills | Status: DC

## 2020-03-23 MED ORDER — TRIAMCINOLONE ACETONIDE 0.1 % EX CREA: 1.0000 "application " | TOPICAL_CREAM | Freq: Two times a day (BID) | CUTANEOUS | 0 refills | Status: DC

## 2020-03-23 NOTE — Patient Instructions (Signed)
° ° ° °  If you have lab work done today you will be contacted with your lab results within the next 2 weeks.  If you have not heard from us then please contact us. The fastest way to get your results is to register for My Chart. ° ° °IF you received an x-ray today, you will receive an invoice from [facility]. Please contact [facility] at [phone] with questions or concerns regarding your invoice.  ° °IF you received labwork today, you will receive an invoice from [facility]. Please contact [facility] at [phone] with questions or concerns regarding your invoice.  ° °Our billing staff will not be able to assist you with questions regarding bills from these companies. ° °You will be contacted with the lab results as soon as they are available. The fastest way to get your results is to activate your My Chart account. Instructions are located on the last page of this paperwork. If you have not heard from us regarding the results in 2 weeks, please contact this office. °  ° ° ° °

## 2020-03-23 NOTE — Telephone Encounter (Signed)
Pt wife is back regarding this . Please advise

## 2020-03-23 NOTE — Telephone Encounter (Addendum)
Called pt wife and pt has an appointment to talk about anxiety at 150 pm today.

## 2020-03-26 ENCOUNTER — Ambulatory Visit: Payer: Medicare Other | Admitting: Registered Nurse

## 2020-03-27 ENCOUNTER — Telehealth: Payer: Self-pay

## 2020-03-27 ENCOUNTER — Other Ambulatory Visit: Payer: Self-pay | Admitting: Registered Nurse

## 2020-03-27 DIAGNOSIS — F411 Generalized anxiety disorder: Secondary | ICD-10-CM

## 2020-03-27 MED ORDER — ESCITALOPRAM OXALATE 10 MG PO TABS: 10.0000 mg | ORAL_TABLET | Freq: Every day | ORAL | 0 refills | Status: DC

## 2020-03-27 NOTE — Telephone Encounter (Signed)
Pt wife called stating he wants to be on the anxiety med you had discussed at his appt 03/23/2020 if you could send that in.

## 2020-03-27 NOTE — Telephone Encounter (Signed)
Sent escitalopram 10mg  PO qd, preferably take in evenings. Take daily for best effect. Will take 4-6 weeks to reach optimal effect  Kathrin Ruddy, NP

## 2020-03-28 DIAGNOSIS — H903 Sensorineural hearing loss, bilateral: Secondary | ICD-10-CM | POA: Diagnosis not present

## 2020-03-28 DIAGNOSIS — R42 Dizziness and giddiness: Secondary | ICD-10-CM | POA: Diagnosis not present

## 2020-04-02 ENCOUNTER — Encounter: Payer: Medicare Other | Admitting: Physical Medicine & Rehabilitation

## 2020-04-02 ENCOUNTER — Ambulatory Visit (INDEPENDENT_AMBULATORY_CARE_PROVIDER_SITE_OTHER): Payer: Medicare Other | Admitting: Otolaryngology

## 2020-04-02 DIAGNOSIS — R109 Unspecified abdominal pain: Secondary | ICD-10-CM | POA: Diagnosis not present

## 2020-04-02 DIAGNOSIS — R110 Nausea: Secondary | ICD-10-CM | POA: Diagnosis not present

## 2020-04-03 ENCOUNTER — Encounter: Payer: Self-pay | Admitting: Neurology

## 2020-04-03 ENCOUNTER — Ambulatory Visit: Payer: Medicare Other | Admitting: Neurology

## 2020-04-03 ENCOUNTER — Other Ambulatory Visit: Payer: Self-pay

## 2020-04-03 VITALS — BP 101/68 | HR 67 | Wt 153.6 lb

## 2020-04-03 DIAGNOSIS — G3184 Mild cognitive impairment, so stated: Secondary | ICD-10-CM | POA: Diagnosis not present

## 2020-04-03 DIAGNOSIS — I63431 Cerebral infarction due to embolism of right posterior cerebral artery: Secondary | ICD-10-CM | POA: Diagnosis not present

## 2020-04-03 MED ORDER — FISH OIL 1000 MG PO CAPS: 1.0000 | ORAL_CAPSULE | Freq: Every morning | ORAL | 0 refills | Status: DC

## 2020-04-03 NOTE — Progress Notes (Signed)
[facility] [address] [place]. [place] [address] [phone]       OFFICE FOLLOW-UP NOTE  [name] Date of Birth:  29-Sep-1941 Medical Record Number:  [id number]   HPI: [name] is a 79 year old Caucasian male seen today for initial office follow-up visit following hospital consultation for stroke.  He is accompanied by his wife.  His history is obtained from them, review of electronic medical records and I personally reviewed imaging films in PACS.  He has a past medical history of chronic systolic heart failure, hypertension, hyperlipidemia, coronary artery disease, neuropathy, diabetes who presented on 02/25/2020 with feeling foggy while doing things and not feeling well.  He felt dizzy and lightheaded and had some pain in the back of his head.  Primary care physician asked him to come to the ER where MRI scan was obtained which showed patchy right PCA territory infarcts.  CT angiogram showed moderate bilateral posterior cerebral artery stenosis in the P2 segments.  MRI had also shown an old left occipital infarct and chronic blood products along bilateral occipital cortex..  Echocardiogram was unremarkable.  LDL cholesterol was slightly elevated 74 mg percent and hemoglobin A1c was 6.4.  Patient states is done well since discharge.  He still feels dizzy particularly when he standing for long.  He is on aspirin and Plavix but developing easy bruising but no bleeding episodes.  His blood pressure is well controlled today it is 101/68.  Is tolerating Crestor well without muscle aches and pains.  He does complain of memory difficulties particularly short-term memory.  He has not been evaluated for this.  ROS:   14 system review of systems is positive for decreased hearing, memory loss, confusion, constipation, cramps, anxiety, fatigue and weight loss vision loss, dizziness and all other systems negative  PMH:  Past Medical History:  Diagnosis Date  . Allergic rhinitis  due to pollen 03/29/2018  . Chronic systolic heart failure (HCC) - EF improved to normal on Rx    Echo 11/18: Mild LVH, EF 20, diffuse HK mild AI, a sending aorta 42 mm (mildly dilated), mild MR, mild TR, trivial PI, PASP 44 // Echo 3/19: mild LVH, EF 25-30, diff HK, Gr 1 DD, asc aorta 43 mm (mildly dilated), MAC, mild reduced RVSF // Limited echo 10/19: Mild concentric LVH, EF 55-97, grade 1 diastolic dysfunction, MAC, mild LAE   . Coronary artery disease involving native coronary artery of native heart without angina pectoris 11/11/2017   R/L HC 11/18: pLAD 45, oD1 65 ; elevated PCWP (mean 29), moderate pulmonary hypertension, CO 3.66, CI 2.07  . Depression with anxiety 07/08/2017  . Essential hypertension 07/08/2017  . GERD with esophagitis 01/11/2018  . Hernia of abdominal cavity   . Hyperlipidemia   . Hyperlipidemia LDL goal <130 05/25/2017   The 10-year ASCVD risk score Mikey Bussing DC Jr., et al., 2013) is: 31.1%   Values used to calculate the score:     Age: 35 years     Sex: Male     Is Non-Hispanic African American: No     Diabetic: No     Tobacco smoker: No     Systolic Blood Pressure: 416 mmHg     Is BP treated: No     HDL Cholesterol: 68.7 mg/dL     Total Cholesterol: 258 mg/dL   . Idiopathic inflammatory myopathy 05/25/2017  . Neuropathy involving both lower extremities 05/25/2017  . Nonischemic cardiomyopathy ([place]) 11/11/2017   LHC 09/16/17-nonobstructive CAD  .  Oropharyngeal dysphagia 01/11/2018  . Type 2 diabetes mellitus with complication, without long-term current use of insulin ([place]) 07/08/2017    Social History:  Social History   Socioeconomic History  . Marital status: Single    Spouse name: Not on file  . Number of children: Not on file  . Years of education: Not on file  . Highest education level: Not on file  Occupational History  . Not on file  Tobacco Use  . Smoking status: Never Smoker  . Smokeless tobacco: Never Used  Vaping Use  . Vaping Use: Never used  Substance and  Sexual Activity  . Alcohol use: No  . Drug use: No  . Sexual activity: Not Currently  Other Topics Concern  . Not on file  Social History Narrative  . Not on file   Social Determinants of Health   Financial Resource Strain:   . Difficulty of Paying Living Expenses:   Food Insecurity:   . Worried About Charity fundraiser in the Last Year:   . Arboriculturist in the Last Year:   Transportation Needs:   . Film/video editor (Medical):   Marland Kitchen Lack of Transportation (Non-Medical):   Physical Activity:   . Days of Exercise per Week:   . Minutes of Exercise per Session:   Stress:   . Feeling of Stress :   Social Connections:   . Frequency of Communication with Friends and Family:   . Frequency of Social Gatherings with Friends and Family:   . Attends Religious Services:   . Active Member of Clubs or Organizations:   . Attends Archivist Meetings:   Marland Kitchen Marital Status:   Intimate Partner Violence:   . Fear of Current or Ex-Partner:   . Emotionally Abused:   Marland Kitchen Physically Abused:   . Sexually Abused:     Medications:   Current Outpatient Medications on File Prior to Visit  Medication Sig Dispense Refill  . acetaminophen (TYLENOL) 325 MG tablet Take 2 tablets (650 mg total) by mouth every 4 (four) hours as needed for mild pain (or temp > 37.5 C (99.5 F)).    Marland Kitchen acetic acid 2 % otic solution Place 4 drops into both ears 3 (three) times daily. 15 mL 0  . acetic acid-aluminum acetate (DOMEBORO OTIC) 2 % OTIC solution Place in ear(s).    Marland Kitchen aspirin EC 81 MG tablet Take 1 tablet (81 mg total) by mouth daily. 90 tablet 3  . carvedilol (COREG) 3.125 MG tablet Take 1 tablet (3.125 mg total) by mouth daily. 30 tablet 0  . cholecalciferol (VITAMIN D) 1000 units tablet Take 1 tablet (1,000 Units total) by mouth daily. 30 tablet 0  . clopidogrel (PLAVIX) 75 MG tablet Take 1 tablet (75 mg total) by mouth daily. 30 tablet 1  . escitalopram (LEXAPRO) 10 MG tablet Take 1 tablet (10 mg  total) by mouth daily. 90 tablet 0  . esomeprazole (NEXIUM) 40 MG capsule Take 1 capsule (40 mg total) by mouth as needed (for heartburn). 30 capsule 5  . furosemide (LASIX) 40 MG tablet Take 0.5 tablets (20 mg total) by mouth 3 (three) times a week. 30 tablet 6  . olopatadine (PATANOL) 0.1 % ophthalmic solution INSTILL 1 DROP INTO BOTH EYS DAILY AS NEEDED FOR ALLERGIES 5 mL 2  . rosuvastatin (CRESTOR) 5 MG tablet Take 1 tablet (5 mg total) by mouth daily. 90 tablet 3  . sacubitril-valsartan (ENTRESTO) 24-26 MG Take 1 tablet by mouth  2 (two) times daily. 180 tablet 3  . triamcinolone cream (KENALOG) 0.1 % Apply 1 application topically 2 (two) times daily. 30 g 0   No current facility-administered medications on file prior to visit.    Allergies:   Allergies  Allergen Reactions  . Amoxicillin Hives  . Bactrim [Sulfamethoxazole-Trimethoprim] Other (See Comments)    Dried out his eyes  . Cephalexin Itching  . Hydrocodone Itching  . Lipitor [Atorvastatin] Other (See Comments)    Damage to calf muscles  . Oxycodone Itching  . Statins Other (See Comments)    Muscle pain   . Vytorin [Ezetimibe-Simvastatin] Other (See Comments)    Damage to calf muscles    Physical Exam General: well developed, well nourished elderly Caucasian male, seated, in no evident distress Head: head normocephalic and atraumatic.  Neck: supple with no carotid or supraclavicular bruits Cardiovascular: regular rate and rhythm, no murmurs Musculoskeletal: no deformity Skin:  no rash/petichiae Vascular:  Normal pulses all extremities Vitals:   04/03/20 1341  BP: 101/68  Pulse: 67   Neurologic Exam Mental Status: Awake and fully alert. Oriented to place and time. Recent and remote memory poor attention span, concentration and fund of knowledge diminished. Mood and affect appropriate.  Diminished recall 2/3.  Able to name 12 animals which can walk on 4 legs.  Clock drawing 3/4.  Unable to copy intersecting  pentagons. Cranial Nerves: Fundoscopic exam reveals sharp disc margins. Pupils equal, briskly reactive to light. Extraocular movements full without nystagmus. Visual fields full show dense left homonymous hemianopsia confrontation. Hearing diminished bilaterally. Facial sensation intact. Face, tongue, palate moves normally and symmetrically.  Motor: Normal bulk and tone. Normal strength in all tested extremity muscles.  Diminished fine finger movements on the left.  Orbits right over left upper extremity. Sensory.: intact to touch ,pinprick .position and vibratory sensation.  Coordination: Rapid alternating movements normal in all extremities. Finger-to-nose and heel-to-shin performed accurately bilaterally. Gait and Station: Arises from chair without difficulty. Stance is normal. Gait demonstrates normal stride length and balance . Able to heel, toe and tandem walk with mild difficulty.  Reflexes: 1+ and symmetric. Toes downgoing.   NIHSS 3 Modified Rankin  2  ASSESSMENT: 79 year old Caucasian male with patchy right posterior cerebral artery embolic infarct in May 0998 due to intracranial atherosclerosis.  Vascular risk factors of hypertension, hyperlipidemia, intracranial atherosclerosis and previous cerebrovascular disease.  He also has mild cognitive impairment     PLAN:  I had a long d/w patient and his wife about his recent stroke, risk for recurrent stroke/TIAs, personally independently reviewed imaging studies and stroke evaluation results and answered questions.Continue aspirin and Plavix for 2 more months and then discontinue aspirin and stay on Plavix alone for secondary stroke prevention and maintain strict control of hypertension with blood pressure goal below 130/90, diabetes with hemoglobin A1c goal below 6.5% and lipids with LDL cholesterol goal below 70 mg/dL. I also advised the patient to eat a healthy diet with plenty of whole grains, cereals, fruits and vegetables, exercise  regularly and maintain ideal body weight I have advised the patient not to drive due to his peripheral vision loss and cognitive difficulties.  Encouraged him to increase participation in cognitively challenging active activities like solving crossword puzzles, playing bridge and sodoku.  We also discussed memory compensation strategies.  Start fish oil 1000 mg daily as well.  Followup in the future with my nurse practitioner Janett Billow in 3 months or call earlier if necessary. Greater than 50% of time  during this30 minute visit was spent on counseling,explanation of diagnosis of stroke and memory loss, planning of further management, discussion with patient and family and coordination of care Antony Contras, MD  [facility] [address] [place], [place] [address]  Phone [phone] Fax [phone] Note: This document was prepared with digital dictation and possible smart phrase technology. Any transcriptional errors that result from this process are unintentional

## 2020-04-03 NOTE — Patient Instructions (Signed)
I had a long d/w patient and his wife about his recent stroke, risk for recurrent stroke/TIAs, personally independently reviewed imaging studies and stroke evaluation results and answered questions.Continue aspirin and Plavix for 2 more months and then discontinue aspirin and stay on Plavix alone for secondary stroke prevention and maintain strict control of hypertension with blood pressure goal below 130/90, diabetes with hemoglobin A1c goal below 6.5% and lipids with LDL cholesterol goal below 70 mg/dL. I also advised the patient to eat a healthy diet with plenty of whole grains, cereals, fruits and vegetables, exercise regularly and maintain ideal body weight I have advised the patient not to drive due to his peripheral vision loss and cognitive difficulties.  Encouraged him to increase participation in cognitively challenging active activities like solving crossword puzzles, playing bridge and sodoku.  We also discussed memory compensation strategies.  Start fish oil 1000 mg daily as well.  Followup in the future with my nurse practitioner Janett Billow in 3 months or call earlier if necessary.  Stroke Prevention Some medical conditions and behaviors are associated with a higher chance of having a stroke. You can help prevent a stroke by making nutrition, lifestyle, and other changes, including managing any medical conditions you may have. What nutrition changes can be made?   Eat healthy foods. You can do this by: ? Choosing foods high in fiber, such as fresh fruits and vegetables and whole grains. ? Eating at least 5 or more servings of fruits and vegetables a day. Try to fill half of your plate at each meal with fruits and vegetables. ? Choosing lean protein foods, such as lean cuts of meat, poultry without skin, fish, tofu, beans, and nuts. ? Eating low-fat dairy products. ? Avoiding foods that are high in salt (sodium). This can help lower blood pressure. ? Avoiding foods that have saturated fat,  trans fat, and cholesterol. This can help prevent high cholesterol. ? Avoiding processed and premade foods.  Follow your health care provider's specific guidelines for losing weight, controlling high blood pressure (hypertension), lowering high cholesterol, and managing diabetes. These may include: ? Reducing your daily calorie intake. ? Limiting your daily sodium intake to 1,500 milligrams (mg). ? Using only healthy fats for cooking, such as olive oil, canola oil, or sunflower oil. ? Counting your daily carbohydrate intake. What lifestyle changes can be made?  Maintain a healthy weight. Talk to your health care provider about your ideal weight.  Get at least 30 minutes of moderate physical activity at least 5 days a week. Moderate activity includes brisk walking, biking, and swimming.  Do not use any products that contain nicotine or tobacco, such as cigarettes and e-cigarettes. If you need help quitting, ask your health care provider. It may also be helpful to avoid exposure to secondhand smoke.  Limit alcohol intake to no more than 1 drink a day for nonpregnant women and 2 drinks a day for men. One drink equals 12 oz of beer, 5 oz of wine, or 1 oz of hard liquor.  Stop any illegal drug use.  Avoid taking birth control pills. Talk to your health care provider about the risks of taking birth control pills if: ? You are over 82 years old. ? You smoke. ? You get migraines. ? You have ever had a blood clot. What other changes can be made?  Manage your cholesterol levels. ? Eating a healthy diet is important for preventing high cholesterol. If cholesterol cannot be managed through diet alone, you may also  need to take medicines. ? Take any prescribed medicines to control your cholesterol as told by your health care provider.  Manage your diabetes. ? Eating a healthy diet and exercising regularly are important parts of managing your blood sugar. If your blood sugar cannot be managed  through diet and exercise, you may need to take medicines. ? Take any prescribed medicines to control your diabetes as told by your health care provider.  Control your hypertension. ? To reduce your risk of stroke, try to keep your blood pressure below 130/80. ? Eating a healthy diet and exercising regularly are an important part of controlling your blood pressure. If your blood pressure cannot be managed through diet and exercise, you may need to take medicines. ? Take any prescribed medicines to control hypertension as told by your health care provider. ? Ask your health care provider if you should monitor your blood pressure at home. ? Have your blood pressure checked every year, even if your blood pressure is normal. Blood pressure increases with age and some medical conditions.  Get evaluated for sleep disorders (sleep apnea). Talk to your health care provider about getting a sleep evaluation if you snore a lot or have excessive sleepiness.  Take over-the-counter and prescription medicines only as told by your health care provider. Aspirin or blood thinners (antiplatelets or anticoagulants) may be recommended to reduce your risk of forming blood clots that can lead to stroke.  Make sure that any other medical conditions you have, such as atrial fibrillation or atherosclerosis, are managed. What are the warning signs of a stroke? The warning signs of a stroke can be easily remembered as BEFAST.  B is for balance. Signs include: ? Dizziness. ? Loss of balance or coordination. ? Sudden trouble walking.  E is for eyes. Signs include: ? A sudden change in vision. ? Trouble seeing.  F is for face. Signs include: ? Sudden weakness or numbness of the face. ? The face or eyelid drooping to one side.  A is for arms. Signs include: ? Sudden weakness or numbness of the arm, usually on one side of the body.  S is for speech. Signs include: ? Trouble speaking (aphasia). ? Trouble  understanding.  T is for time. ? These symptoms may represent a serious problem that is an emergency. Do not wait to see if the symptoms will go away. Get medical help right away. Call your local emergency services (911 in the [place] Do not drive yourself to the hospital.  Other signs of stroke may include: ? A sudden, severe headache with no known cause. ? Nausea or vomiting. ? Seizure. Where to find more information For more information, visit:  American Stroke Association: www.strokeassociation.org  National Stroke Association: www.stroke.org Summary  You can prevent a stroke by eating healthy, exercising, not smoking, limiting alcohol intake, and managing any medical conditions you may have.  Do not use any products that contain nicotine or tobacco, such as cigarettes and e-cigarettes. If you need help quitting, ask your health care provider. It may also be helpful to avoid exposure to secondhand smoke.  Remember BEFAST for warning signs of stroke. Get help right away if you or a loved one has any of these signs. This information is not intended to replace advice given to you by your health care provider. Make sure you discuss any questions you have with your health care provider. Document Revised: 09/18/2017 Document Reviewed: 11/11/2016 Elsevier Patient Education  2020 [employer].

## 2020-04-06 DIAGNOSIS — R42 Dizziness and giddiness: Secondary | ICD-10-CM | POA: Diagnosis not present

## 2020-04-06 DIAGNOSIS — H903 Sensorineural hearing loss, bilateral: Secondary | ICD-10-CM | POA: Diagnosis not present

## 2020-04-06 DIAGNOSIS — R2689 Other abnormalities of gait and mobility: Secondary | ICD-10-CM | POA: Diagnosis not present

## 2020-04-09 ENCOUNTER — Encounter: Payer: Medicare Other | Admitting: Physical Medicine & Rehabilitation

## 2020-04-11 DIAGNOSIS — R2689 Other abnormalities of gait and mobility: Secondary | ICD-10-CM | POA: Diagnosis not present

## 2020-04-11 DIAGNOSIS — H903 Sensorineural hearing loss, bilateral: Secondary | ICD-10-CM | POA: Diagnosis not present

## 2020-04-11 DIAGNOSIS — R42 Dizziness and giddiness: Secondary | ICD-10-CM | POA: Diagnosis not present

## 2020-04-18 ENCOUNTER — Ambulatory Visit: Payer: Medicare Other | Admitting: Neurology

## 2020-04-24 ENCOUNTER — Other Ambulatory Visit: Payer: Self-pay

## 2020-04-24 ENCOUNTER — Ambulatory Visit (INDEPENDENT_AMBULATORY_CARE_PROVIDER_SITE_OTHER): Payer: Medicare Other | Admitting: Registered Nurse

## 2020-04-24 ENCOUNTER — Encounter: Payer: Self-pay | Admitting: Registered Nurse

## 2020-04-24 VITALS — BP 123/78 | HR 73 | Temp 98.7°F | Resp 18 | Ht 66.0 in | Wt 153.9 lb

## 2020-04-24 DIAGNOSIS — F411 Generalized anxiety disorder: Secondary | ICD-10-CM

## 2020-04-24 DIAGNOSIS — H60393 Other infective otitis externa, bilateral: Secondary | ICD-10-CM | POA: Diagnosis not present

## 2020-04-24 DIAGNOSIS — G479 Sleep disorder, unspecified: Secondary | ICD-10-CM

## 2020-04-24 MED ORDER — TRAZODONE HCL 50 MG PO TABS: 25.0000 mg | ORAL_TABLET | Freq: Every evening | ORAL | 3 refills | Status: DC | PRN

## 2020-04-24 MED ORDER — ESCITALOPRAM OXALATE 20 MG PO TABS: 20.0000 mg | ORAL_TABLET | Freq: Every day | ORAL | 1 refills | Status: DC

## 2020-04-24 MED ORDER — NEOMYCIN-POLYMYXIN-HC 1 % OT SOLN: 3.0000 [drp] | Freq: Three times a day (TID) | OTIC | 0 refills | Status: DC

## 2020-04-24 NOTE — Patient Instructions (Signed)
° ° ° °  If you have lab work done today you will be contacted with your lab results within the next 2 weeks.  If you have not heard from us then please contact us. The fastest way to get your results is to register for My Chart. ° ° °IF you received an x-ray today, you will receive an invoice from [facility]. Please contact [facility] at [phone] with questions or concerns regarding your invoice.  ° °IF you received labwork today, you will receive an invoice from [facility]. Please contact [facility] at [phone] with questions or concerns regarding your invoice.  ° °Our billing staff will not be able to assist you with questions regarding bills from these companies. ° °You will be contacted with the lab results as soon as they are available. The fastest way to get your results is to activate your My Chart account. Instructions are located on the last page of this paperwork. If you have not heard from us regarding the results in 2 weeks, please contact this office. °  ° ° ° °

## 2020-04-26 DIAGNOSIS — R2689 Other abnormalities of gait and mobility: Secondary | ICD-10-CM | POA: Diagnosis not present

## 2020-04-26 DIAGNOSIS — R42 Dizziness and giddiness: Secondary | ICD-10-CM | POA: Diagnosis not present

## 2020-04-27 ENCOUNTER — Ambulatory Visit: Payer: Medicare Other | Admitting: Registered Nurse

## 2020-05-08 DIAGNOSIS — R2689 Other abnormalities of gait and mobility: Secondary | ICD-10-CM | POA: Diagnosis not present

## 2020-05-08 DIAGNOSIS — R42 Dizziness and giddiness: Secondary | ICD-10-CM | POA: Diagnosis not present

## 2020-05-09 ENCOUNTER — Ambulatory Visit: Payer: Medicare Other | Admitting: Registered Nurse

## 2020-05-14 ENCOUNTER — Ambulatory Visit (INDEPENDENT_AMBULATORY_CARE_PROVIDER_SITE_OTHER): Payer: Medicare Other | Admitting: Registered Nurse

## 2020-05-14 ENCOUNTER — Other Ambulatory Visit: Payer: Self-pay

## 2020-05-14 ENCOUNTER — Encounter: Payer: Self-pay | Admitting: Registered Nurse

## 2020-05-14 VITALS — BP 111/69 | HR 69 | Temp 97.7°F | Resp 18 | Ht 66.0 in | Wt 153.8 lb

## 2020-05-14 DIAGNOSIS — I5022 Chronic systolic (congestive) heart failure: Secondary | ICD-10-CM

## 2020-05-14 DIAGNOSIS — R21 Rash and other nonspecific skin eruption: Secondary | ICD-10-CM

## 2020-05-14 DIAGNOSIS — Z130 Encounter for screening for diseases of the blood and blood-forming organs and certain disorders involving the immune mechanism: Secondary | ICD-10-CM | POA: Diagnosis not present

## 2020-05-14 DIAGNOSIS — Z13220 Encounter for screening for lipoid disorders: Secondary | ICD-10-CM

## 2020-05-14 DIAGNOSIS — Z1329 Encounter for screening for other suspected endocrine disorder: Secondary | ICD-10-CM

## 2020-05-14 DIAGNOSIS — Z13228 Encounter for screening for other metabolic disorders: Secondary | ICD-10-CM

## 2020-05-14 LAB — GLUCOSE, POCT (MANUAL RESULT ENTRY): POC Glucose: 113 mg/dl — AB (ref 70–99)

## 2020-05-14 MED ORDER — TRIAMCINOLONE ACETONIDE 0.1 % EX CREA: 1.0000 "application " | TOPICAL_CREAM | Freq: Two times a day (BID) | CUTANEOUS | 0 refills | Status: DC

## 2020-05-14 NOTE — Patient Instructions (Signed)
° ° ° °  If you have lab work done today you will be contacted with your lab results within the next 2 weeks.  If you have not heard from us then please contact us. The fastest way to get your results is to register for My Chart. ° ° °IF you received an x-ray today, you will receive an invoice from [facility]. Please contact [facility] at [phone] with questions or concerns regarding your invoice.  ° °IF you received labwork today, you will receive an invoice from [facility]. Please contact [facility] at [phone] with questions or concerns regarding your invoice.  ° °Our billing staff will not be able to assist you with questions regarding bills from these companies. ° °You will be contacted with the lab results as soon as they are available. The fastest way to get your results is to activate your My Chart account. Instructions are located on the last page of this paperwork. If you have not heard from us regarding the results in 2 weeks, please contact this office. °  ° ° ° °

## 2020-05-15 ENCOUNTER — Ambulatory Visit: Payer: Medicare Other | Admitting: Registered Nurse

## 2020-05-15 LAB — COMPREHENSIVE METABOLIC PANEL
ALT: 14 IU/L (ref 0–44)
AST: 15 IU/L (ref 0–40)
Albumin/Globulin Ratio: 2.3 — ABNORMAL HIGH (ref 1.2–2.2)
Albumin: 4.5 g/dL (ref 3.7–4.7)
Alkaline Phosphatase: 47 IU/L — ABNORMAL LOW (ref 48–121)
BUN/Creatinine Ratio: 17 (ref 10–24)
BUN: 14 mg/dL (ref 8–27)
Bilirubin Total: 0.9 mg/dL (ref 0.0–1.2)
CO2: 19 mmol/L — ABNORMAL LOW (ref 20–29)
Calcium: 8.7 mg/dL (ref 8.6–10.2)
Chloride: 100 mmol/L (ref 96–106)
Creatinine, Ser: 0.84 mg/dL (ref 0.76–1.27)
GFR calc Af Amer: 96 mL/min/{1.73_m2} (ref >59)
GFR calc non Af Amer: 83 mL/min/{1.73_m2} (ref >59)
Globulin, Total: 2 g/dL (ref 1.5–4.5)
Glucose: 110 mg/dL — ABNORMAL HIGH (ref 65–99)
Potassium: 4.5 mmol/L (ref 3.5–5.2)
Sodium: 137 mmol/L (ref 134–144)
Total Protein: 6.5 g/dL (ref 6.0–8.5)

## 2020-05-15 LAB — CBC WITH DIFFERENTIAL
Basophils Absolute: 0 10*3/uL (ref 0.0–0.2)
Basos: 1 %
EOS (ABSOLUTE): 0.1 10*3/uL (ref 0.0–0.4)
Eos: 1 %
Hematocrit: 42.2 % (ref 37.5–51.0)
Hemoglobin: 14.3 g/dL (ref 13.0–17.7)
Immature Grans (Abs): 0 10*3/uL (ref 0.0–0.1)
Immature Granulocytes: 1 %
Lymphocytes Absolute: 1.3 10*3/uL (ref 0.7–3.1)
Lymphs: 20 %
MCH: 30 pg (ref 26.6–33.0)
MCHC: 33.9 g/dL (ref 31.5–35.7)
MCV: 89 fL (ref 79–97)
Monocytes Absolute: 0.5 10*3/uL (ref 0.1–0.9)
Monocytes: 7 %
Neutrophils Absolute: 4.7 10*3/uL (ref 1.4–7.0)
Neutrophils: 70 %
RBC: 4.76 x10E6/uL (ref 4.14–5.80)
RDW: 14 % (ref 11.6–15.4)
WBC: 6.6 10*3/uL (ref 3.4–10.8)

## 2020-05-15 LAB — LIPID PANEL
Chol/HDL Ratio: 3 ratio (ref 0.0–5.0)
Cholesterol, Total: 175 mg/dL (ref 100–199)
HDL: 59 mg/dL (ref >39)
LDL Chol Calc (NIH): 98 mg/dL (ref 0–99)
Triglycerides: 102 mg/dL (ref 0–149)
VLDL Cholesterol Cal: 18 mg/dL (ref 5–40)

## 2020-05-15 LAB — HEMOGLOBIN A1C
Est. average glucose Bld gHb Est-mCnc: 131 mg/dL
Hgb A1c MFr Bld: 6.2 % — ABNORMAL HIGH (ref 4.8–5.6)

## 2020-05-15 LAB — TSH: TSH: 0.797 u[IU]/mL (ref 0.450–4.500)

## 2020-05-17 ENCOUNTER — Encounter: Payer: Self-pay | Admitting: Registered Nurse

## 2020-05-20 NOTE — Progress Notes (Signed)
Cardiology Office Note:    Date:  05/20/2020   ID:  Kartier Bennison, DOB 22-Oct-1940, MRN [id number]  PCP:  Maximiano Coss, NP  Cardiologist:  Mertie Moores, MD   Electrophysiologist:  None   Referring MD: Maximiano Coss, NP   Chief Complaint:  No chief complaint on file.    Patient Profile:    Christian [name] is a 79 y.o. male with:   Chronic systolic CHF w/ improved LVF  Non-ischemic cardiomyopathy   Echocardiogram 3/19: EF 25-30  Echocardiogram 10/19: EF 60-65  Echocardiogram 5/21: EF 60-65   Coronary artery disease  Non-obstructive CAD by cath in 11/18  Diabetes mellitus   Hx of CVA 02/2020  Hypertension   Hyperlipidemia   Neuropathy  Idiopathic inflammatory myopathy  Carotid artery Dz  CTA 6/97: RICA 94%; LICA 80%  R subclavian stenosis   Prior CV studies: Echocardiogram 02/26/20 EF 60-65, no RWMA, normal RVSF, mild AoV scleross, no AS, mod dilation of Asc Aorta 42 mm  CTA HEAD AND NECK 02/26/20: 1. Negative CTA for emergent large vessel occlusion. 2. Severe stenosis of the proximal-mid right P2 segment, with markedly attenuated flow within the right PCA distally. 3. Moderate to severe proximal left P2 stenosis. 4. Hypoplastic right vertebral artery largely terminates in PICA. Moderate atheromatous irregularity throughout the dominant left V4 segment without high-grade stenosis. 5. Atheromatous plaque about the carotid bifurcations with associated stenosis of up to 40% on the right and 50% on the left. 6. 50% stenosis at the origin of the right subclavian artery. 7.  Emphysema (ICD10-J43.9). 8. 12 mm left thyroid nodule, of doubtful significance given size and patient age. No follow-up imaging recommended. (Ref: J Am Coll Radiol. 2015 Feb;12(2): 143-50).  Limited echocardiogram 08/04/18 EF 60-65, no RWMA, Gr 1 DD, mild LAE  Echo 12/18/2017 Mild LVH, EF 25-30, diffuse HK, grade 1 diastolic dysfunction, mildly dilated ascending aorta (43 mm), MAC,  mildly reduced RVSF  Right/left cardiac catheterization 09/16/17 LAD proximal 45,ostial D1 65 Mean RA 11, PASP 54, mean PA 39 LVEDP 31, mean wedge 29 1. Nonobstructive CAD 2. Elevated LV filling pressures with PCWP mean of 29 mm Hg 3. Moderate pulmonary HTN 4. Cardiac output 3.66 L/min with index of 2.07.  Echocardiogram 09/15/17 Mild LVH, EF 20, diffuse HK mild AI, a sending aorta 42 mm (mildly dilated), mild MR, mild TR, trivial PI, PASP 44  History of Present Illness:    Mr. Oros was last seen by Dr. Acie Fredrickson in 08/2019.  He was admitted in 02/2020 with an acute CVA.   He returns for follow-up.  He is here with his wife.  He has occasional chest discomfort that he attributes to indigestion.  He takes antacids with relief.  He has not had exertional chest discomfort, exertional shortness of breath, syncope, orthopnea or significant leg swelling.  Past Medical History:  Diagnosis Date  . Allergic rhinitis due to pollen 03/29/2018  . Chronic systolic heart failure (HCC) - EF improved to normal on Rx    Echo 11/18: Mild LVH, EF 20, diffuse HK mild AI, a sending aorta 42 mm (mildly dilated), mild MR, mild TR, trivial PI, PASP 44 // Echo 3/19: mild LVH, EF 25-30, diff HK, Gr 1 DD, asc aorta 43 mm (mildly dilated), MAC, mild reduced RVSF // Limited echo 10/19: Mild concentric LVH, EF 16-55, grade 1 diastolic dysfunction, MAC, mild LAE   . Coronary artery disease involving native coronary artery of native heart without angina pectoris 11/11/2017   R/L [facility] 11/18:  pLAD 45, oD1 65 ; elevated PCWP (mean 29), moderate pulmonary hypertension, CO 3.66, CI 2.07  . Depression with anxiety 07/08/2017  . Essential hypertension 07/08/2017  . GERD with esophagitis 01/11/2018  . Hernia of abdominal cavity   . Hyperlipidemia   . Hyperlipidemia LDL goal <130 05/25/2017   The 10-year ASCVD risk score Mikey Bussing DC Jr., et al., 2013) is: 31.1%   Values used to calculate the score:     Age: 37 years     Sex: Male     Is  Non-Hispanic African American: No     Diabetic: No     Tobacco smoker: No     Systolic Blood Pressure: 539 mmHg     Is BP treated: No     HDL Cholesterol: 68.7 mg/dL     Total Cholesterol: 258 mg/dL   . Idiopathic inflammatory myopathy 05/25/2017  . Neuropathy involving both lower extremities 05/25/2017  . Nonischemic cardiomyopathy ([place]) 11/11/2017   LHC 09/16/17-nonobstructive CAD  . Oropharyngeal dysphagia 01/11/2018  . Type 2 diabetes mellitus with complication, without long-term current use of insulin ([place]) 07/08/2017    Current Medications: No outpatient medications have been marked as taking for the 05/21/20 encounter (Appointment) with Richardson Dopp T, PA-C.     Allergies:   Amoxicillin, Bactrim [sulfamethoxazole-trimethoprim], Cephalexin, Hydrocodone, Lipitor [atorvastatin], Oxycodone, Statins, and Vytorin [ezetimibe-simvastatin]   Social History   Tobacco Use  . Smoking status: Never Smoker  . Smokeless tobacco: Never Used  Vaping Use  . Vaping Use: Never used  Substance Use Topics  . Alcohol use: No  . Drug use: No     Family Hx: The patient's family history includes Early death in his father; Hypertension in his father; Kidney disease in his father; Stroke in his father and mother. There is no history of Cancer, Alcohol abuse, Diabetes, Heart disease, or Hyperlipidemia.  ROS   EKGs/Labs/Other Test Reviewed:    EKG:  EKG is not ordered today.  The ekg ordered today demonstrates n/a  Recent Labs: 03/02/2020: Platelets 216 05/14/2020: ALT 14; BUN 14; Creatinine, Ser 0.84; Hemoglobin 14.3; Potassium 4.5; Sodium 137; TSH 0.797   Recent Lipid Panel Lab Results  Component Value Date/Time   CHOL 175 05/14/2020 03:29 PM   TRIG 102 05/14/2020 03:29 PM   HDL 59 05/14/2020 03:29 PM   CHOLHDL 3.0 05/14/2020 03:29 PM   CHOLHDL 4 07/08/2017 12:12 PM   [place] 98 05/14/2020 03:29 PM    Physical Exam:    VS:  There were no vitals taken for this visit.    Wt Readings from Last 3  Encounters:  05/14/20 153 lb 12.8 oz (69.8 kg)  04/24/20 153 lb 14.4 oz (69.8 kg)  04/03/20 153 lb 9.6 oz (69.7 kg)     Constitutional:      Appearance: Healthy appearance. Not in distress.  Neck:     Vascular: JVD normal.  Pulmonary:     Effort: Pulmonary effort is normal.     Breath sounds: No wheezing. No rales.  Chest:     Comments: Small cystic structure noted over sternoclavicular area on the R Cardiovascular:     Normal rate. Regular rhythm. Normal S1. Normal S2.     Murmurs: There is no murmur.  Edema:    Peripheral edema absent.  Abdominal:     Palpations: Abdomen is soft.  Skin:    General: Skin is warm and dry.  Neurological:     General: No focal deficit present.     Mental  Status: Alert and oriented to person, place and time.     Cranial Nerves: Cranial nerves are intact.      ASSESSMENT & PLAN:    1. Chronic systolic heart failure (HCC) NYHA 2.  Non-ischemic cardiomyopathy.  Volume status stable.  EF has recovered to normal on medical Rx.  Most recent echocardiogram May 2021 demonstrated EF 60-65%.  Continue carvedilol, Entresto.  2. Coronary artery disease involving native coronary artery of native heart without angina pectoris Obstructive disease by cardiac catheterization November 2018.  He is not having anginal symptoms.  Continue clopidogrel, rosuvastatin.  3. History of CVA (cerebrovascular accident) He had a right PCA infarct in May 2021.  This was felt to be related to small vessel disease.  Neurology placed him on aspirin and clopidogrel for 90 days, then clopidogrel alone.  We discussed the importance of aggressive lipid management to reduce the risk of future strokes.  He notes continued memory issues related to his recent stroke.  4. Hyperlipidemia LDL goal <130 He has had difficulty tolerating statins in the past.  He has been tolerating low-dose rosuvastatin.  He is willing to try higher dose rosuvastatin.  Increase rosuvastatin to 10 mg daily.   Obtain fasting CMET, lipids in 3 months.  5. Essential hypertension The patient's blood pressure is controlled on his current regimen.  Continue current therapy.   6. Nodule of chest wall He showed me at place over his right sternoclavicular area that he just noticed.  It feels cystic in nature.  I have asked him to keep an eye on this and to follow-up with primary care for further management.   Dispo:  No follow-ups on file.   Medication Adjustments/Labs and Tests Ordered: Current medicines are reviewed at length with the patient today.  Concerns regarding medicines are outlined above.  Tests Ordered: No orders of the defined types were placed in this encounter.  Medication Changes: No orders of the defined types were placed in this encounter.   Signed, Richardson Dopp, PA-C  05/20/2020 10:13 PM    [facility] [place], [place], [place]  [address] Phone: [phone]; Fax: [phone]

## 2020-05-21 ENCOUNTER — Ambulatory Visit: Payer: Medicare Other | Admitting: Physician Assistant

## 2020-05-21 ENCOUNTER — Other Ambulatory Visit: Payer: Self-pay

## 2020-05-21 ENCOUNTER — Encounter: Payer: Self-pay | Admitting: Physician Assistant

## 2020-05-21 VITALS — BP 98/60 | HR 71 | Ht 66.0 in | Wt 155.0 lb

## 2020-05-21 DIAGNOSIS — Z8673 Personal history of transient ischemic attack (TIA), and cerebral infarction without residual deficits: Secondary | ICD-10-CM

## 2020-05-21 DIAGNOSIS — I2510 Atherosclerotic heart disease of native coronary artery without angina pectoris: Secondary | ICD-10-CM

## 2020-05-21 DIAGNOSIS — I5022 Chronic systolic (congestive) heart failure: Secondary | ICD-10-CM | POA: Diagnosis not present

## 2020-05-21 DIAGNOSIS — I10 Essential (primary) hypertension: Secondary | ICD-10-CM

## 2020-05-21 DIAGNOSIS — E785 Hyperlipidemia, unspecified: Secondary | ICD-10-CM | POA: Diagnosis not present

## 2020-05-21 DIAGNOSIS — R222 Localized swelling, mass and lump, trunk: Secondary | ICD-10-CM

## 2020-05-21 MED ORDER — ROSUVASTATIN CALCIUM 10 MG PO TABS: 10.0000 mg | ORAL_TABLET | Freq: Every day | ORAL | 1 refills | Status: DC

## 2020-05-21 NOTE — Patient Instructions (Addendum)
Medication Instructions:  Your physician has recommended you make the following change in your medication:   1) Increase Crestor to 10 mg, 1 tablet by mouth once a day  *If you need a refill on your cardiac medications before your next appointment, please call your pharmacy*  Lab Work: Your physician recommends that you return for lab work in: 3 months on 08/21/20  If you have labs (blood work) drawn today and your tests are completely normal, you will receive your results only by: Marland Kitchen MyChart Message (if you have MyChart) OR . A paper copy in the mail If you have any lab test that is abnormal or we need to change your treatment, we will call you to review the results.  Testing/Procedures: None ordered today  Follow-Up: At [facility], you and your health needs are our priority.  As part of our continuing mission to provide you with exceptional heart care, we have created designated Provider Care Teams.  These Care Teams include your primary Cardiologist (physician) and Advanced Practice Providers (APPs -  Physician Assistants and Nurse Practitioners) who all work together to provide you with the care you need, when you need it.  We recommend signing up for the patient portal called "MyChart".  Sign up information is provided on this After Visit Summary.  MyChart is used to connect with patients for Virtual Visits (Telemedicine).  Patients are able to view lab/test results, encounter notes, upcoming appointments, etc.  Non-urgent messages can be sent to your provider as well.   To learn more about what you can do with MyChart, go to [email or web address].    Your next appointment:   6 month(s)  The format for your next appointment:   In Person  Provider:   Mertie Moores, MD

## 2020-05-22 ENCOUNTER — Telehealth: Payer: Self-pay

## 2020-05-22 NOTE — Telephone Encounter (Signed)
**Note De-Identified  Obfuscation** I called the pt and he gave me verbal permission to s/w a lady who is at his home with him who I think he referred to as his wife. I gave her [employer] pt asst phone number and asked her to call them with any questions they may have of them and to request that they mail him an application.  She is aware to complete the pts part of the application and to bring to the office to drop off and that we will take care of the provider part of the application and will fax to [employer] pt asst foundation.  She thanked me for calling them with this information.

## 2020-05-22 NOTE — Telephone Encounter (Signed)
Thank you Nejla Reasor! 

## 2020-06-05 DIAGNOSIS — H53462 Homonymous bilateral field defects, left side: Secondary | ICD-10-CM | POA: Diagnosis not present

## 2020-06-06 ENCOUNTER — Other Ambulatory Visit: Payer: Self-pay

## 2020-06-06 ENCOUNTER — Telehealth: Payer: Self-pay | Admitting: Registered Nurse

## 2020-06-06 DIAGNOSIS — R42 Dizziness and giddiness: Secondary | ICD-10-CM | POA: Diagnosis not present

## 2020-06-06 MED ORDER — CLOPIDOGREL BISULFATE 75 MG PO TABS: 75.0000 mg | ORAL_TABLET | Freq: Every day | ORAL | 3 refills | Status: DC

## 2020-06-06 NOTE — Telephone Encounter (Signed)
We did not place that referral it looks like it was orderd by Cathlyn Parsons, PA-C they would need an appointment for evaluation for [place] to order a referral

## 2020-06-06 NOTE — Telephone Encounter (Signed)
Per last office visit on 05/21/20 with Richardson Dopp, PA, . Coronary artery disease involving native coronary artery of native heart without angina pectoris Obstructive disease by cardiac catheterization November 2018.  He is not having anginal symptoms.  Continue clopidogrel, rosuvastatin. I sent in a new Rx for clopidogrel. Confirmation received.

## 2020-06-06 NOTE — Telephone Encounter (Signed)
Needs another referral placed for physical therapy for his neck to continue. Please advise

## 2020-06-12 ENCOUNTER — Ambulatory Visit (INDEPENDENT_AMBULATORY_CARE_PROVIDER_SITE_OTHER): Payer: Medicare Other | Admitting: Registered Nurse

## 2020-06-12 ENCOUNTER — Encounter: Payer: Self-pay | Admitting: Registered Nurse

## 2020-06-12 ENCOUNTER — Other Ambulatory Visit: Payer: Self-pay

## 2020-06-12 VITALS — BP 102/66 | HR 72 | Temp 97.8°F | Resp 18 | Ht 66.0 in | Wt 156.4 lb

## 2020-06-12 DIAGNOSIS — R110 Nausea: Secondary | ICD-10-CM

## 2020-06-12 DIAGNOSIS — Z23 Encounter for immunization: Secondary | ICD-10-CM

## 2020-06-12 DIAGNOSIS — R221 Localized swelling, mass and lump, neck: Secondary | ICD-10-CM

## 2020-06-12 MED ORDER — ONDANSETRON 4 MG PO TBDP: 8.0000 mg | ORAL_TABLET | ORAL | 0 refills | Status: DC | PRN

## 2020-06-12 NOTE — Patient Instructions (Signed)
° ° ° °  If you have lab work done today you will be contacted with your lab results within the next 2 weeks.  If you have not heard from us then please contact us. The fastest way to get your results is to register for My Chart. ° ° °IF you received an x-ray today, you will receive an invoice from [facility]. Please contact [facility] at [phone] with questions or concerns regarding your invoice.  ° °IF you received labwork today, you will receive an invoice from [facility]. Please contact [facility] at [phone] with questions or concerns regarding your invoice.  ° °Our billing staff will not be able to assist you with questions regarding bills from these companies. ° °You will be contacted with the lab results as soon as they are available. The fastest way to get your results is to activate your My Chart account. Instructions are located on the last page of this paperwork. If you have not heard from us regarding the results in 2 weeks, please contact this office. °  ° ° ° °

## 2020-06-12 NOTE — Progress Notes (Signed)
Established Patient Office Visit  Subjective:  Patient ID: Christian [name], male    DOB: 1941-07-29  Age: 79 y.o. MRN: [id number]  CC:  Chief Complaint  Patient presents with  . Follow-up    patient states he is here for a 2 month follow up and also noticed a lump in his neck for 3 weeks that started off small but got bigger cardiologist thinks its ab abcess  . Medication Refill    on zofran    HPI Christian [name] presents for follow up- 2 mo Also notes neck mass  Follow up: feeling well. Hoping to get new orders for PT with specialist he had seen previously - these sessions had helped with neck pain, headaches, and vertigo symptoms. Has not been in a few weeks.  Neck mass: anterior, midline, 1-2" above collarbone. Notes that this first started about 3 weeks ago, has worsened and grown since then. No interference with breathing, swallowing, or speaking. It is not painful. no drainage. No redness or warmth. Has not happened before. Denies symptoms of thyroid dysfunction.   Past Medical History:  Diagnosis Date  . Allergic rhinitis due to pollen 03/29/2018  . Chronic systolic heart failure (HCC) - EF improved to normal on Rx    Echo 11/18: Mild LVH, EF 20, diffuse HK mild AI, a sending aorta 42 mm (mildly dilated), mild MR, mild TR, trivial PI, PASP 44 // Echo 3/19: mild LVH, EF 25-30, diff HK, Gr 1 DD, asc aorta 43 mm (mildly dilated), MAC, mild reduced RVSF // Limited echo 10/19: Mild concentric LVH, EF 75-64, grade 1 diastolic dysfunction, MAC, mild LAE   . Coronary artery disease involving native coronary artery of native heart without angina pectoris 11/11/2017   R/L HC 11/18: pLAD 45, oD1 65 ; elevated PCWP (mean 29), moderate pulmonary hypertension, CO 3.66, CI 2.07  . Depression with anxiety 07/08/2017  . Essential hypertension 07/08/2017  . GERD with esophagitis 01/11/2018  . Hernia of abdominal cavity   . Hyperlipidemia   . Hyperlipidemia LDL goal <130 05/25/2017   The 10-year  ASCVD risk score Mikey Bussing DC Jr., et al., 2013) is: 31.1%   Values used to calculate the score:     Age: 36 years     Sex: Male     Is Non-Hispanic African American: No     Diabetic: No     Tobacco smoker: No     Systolic Blood Pressure: 332 mmHg     Is BP treated: No     HDL Cholesterol: 68.7 mg/dL     Total Cholesterol: 258 mg/dL   . Idiopathic inflammatory myopathy 05/25/2017  . Neuropathy involving both lower extremities 05/25/2017  . Nonischemic cardiomyopathy ([place]) 11/11/2017   LHC 09/16/17-nonobstructive CAD  . Oropharyngeal dysphagia 01/11/2018  . Type 2 diabetes mellitus with complication, without long-term current use of insulin ([place]) 07/08/2017    Past Surgical History:  Procedure Laterality Date  . BACK SURGERY    . HERNIA REPAIR    . RIGHT/LEFT HEART CATH AND CORONARY ANGIOGRAPHY N/A 09/16/2017   Procedure: RIGHT/LEFT HEART CATH AND CORONARY ANGIOGRAPHY;  Surgeon: Martinique, Peter M, MD;  Location: [facility];  Service: Cardiovascular;  Laterality: N/A;  . SPINE SURGERY      Family History  Problem Relation Age of Onset  . Stroke Mother   . Kidney disease Father   . Stroke Father   . Hypertension Father   . Early death Father   . Cancer Neg Hx   .  Alcohol abuse Neg Hx   . Diabetes Neg Hx   . Heart disease Neg Hx   . Hyperlipidemia Neg Hx     Social History   Socioeconomic History  . Marital status: Single    Spouse name: Not on file  . Number of children: Not on file  . Years of education: Not on file  . Highest education level: Not on file  Occupational History  . Not on file  Tobacco Use  . Smoking status: Never Smoker  . Smokeless tobacco: Never Used  Vaping Use  . Vaping Use: Never used  Substance and Sexual Activity  . Alcohol use: No  . Drug use: No  . Sexual activity: Not Currently  Other Topics Concern  . Not on file  Social History Narrative  . Not on file   Social Determinants of Health   Financial Resource Strain:   . Difficulty of Paying  Living Expenses: Not on file  Food Insecurity:   . Worried About Charity fundraiser in the Last Year: Not on file  . Ran Out of Food in the Last Year: Not on file  Transportation Needs:   . Lack of Transportation (Medical): Not on file  . Lack of Transportation (Non-Medical): Not on file  Physical Activity:   . Days of Exercise per Week: Not on file  . Minutes of Exercise per Session: Not on file  Stress:   . Feeling of Stress : Not on file  Social Connections:   . Frequency of Communication with Friends and Family: Not on file  . Frequency of Social Gatherings with Friends and Family: Not on file  . Attends Religious Services: Not on file  . Active Member of Clubs or Organizations: Not on file  . Attends Archivist Meetings: Not on file  . Marital Status: Not on file  Intimate Partner Violence:   . Fear of Current or Ex-Partner: Not on file  . Emotionally Abused: Not on file  . Physically Abused: Not on file  . Sexually Abused: Not on file    Outpatient Medications Prior to Visit  Medication Sig Dispense Refill  . acetaminophen (TYLENOL) 325 MG tablet Take 2 tablets (650 mg total) by mouth every 4 (four) hours as needed for mild pain (or temp > 37.5 C (99.5 F)).    . carvedilol (COREG) 3.125 MG tablet Take 1 tablet (3.125 mg total) by mouth daily. 30 tablet 0  . cholecalciferol (VITAMIN D) 1000 units tablet Take 1 tablet (1,000 Units total) by mouth daily. 30 tablet 0  . clopidogrel (PLAVIX) 75 MG tablet Take 1 tablet (75 mg total) by mouth daily. 90 tablet 3  . [employer] GAS RELIEF EXTRA STRENGTH 125 MG chewable tablet Chew 125 mg by mouth as needed.    Marland Kitchen escitalopram (LEXAPRO) 20 MG tablet Take 1 tablet (20 mg total) by mouth daily. 90 tablet 1  . esomeprazole (NEXIUM) 40 MG capsule Take 1 capsule (40 mg total) by mouth as needed (for heartburn). 30 capsule 5  . furosemide (LASIX) 40 MG tablet Take 0.5 tablets (20 mg total) by mouth 3 (three) times a week. 30 tablet 6  .  NEOMYCIN-POLYMYXIN-HYDROCORTISONE (CORTISPORIN) 1 % SOLN OTIC solution Place 3 drops into both ears every 8 (eight) hours. 10 mL 0  . olopatadine (PATANOL) 0.1 % ophthalmic solution INSTILL 1 DROP INTO BOTH EYS DAILY AS NEEDED FOR ALLERGIES 5 mL 2  . Omega-3 Fatty Acids (FISH OIL) 1000 MG CAPS Take 1  capsule (1,000 mg total) by mouth every morning. 1 capsule 0  . rosuvastatin (CRESTOR) 10 MG tablet Take 1 tablet (10 mg total) by mouth daily. 90 tablet 1  . sacubitril-valsartan (ENTRESTO) 24-26 MG Take 1 tablet by mouth 2 (two) times daily. 180 tablet 3  . traZODone (DESYREL) 50 MG tablet Take 0.5-1 tablets (25-50 mg total) by mouth at bedtime as needed for sleep. 30 tablet 3  . triamcinolone cream (KENALOG) 0.1 % Apply 1 application topically daily.    . ondansetron (ZOFRAN-ODT) 4 MG disintegrating tablet 8 mg as needed.     No facility-administered medications prior to visit.    Allergies  Allergen Reactions  . Amoxicillin Hives  . Bactrim [Sulfamethoxazole-Trimethoprim] Other (See Comments)    Dried out his eyes  . Cephalexin Itching  . Hydrocodone Itching  . Lipitor [Atorvastatin] Other (See Comments)    Damage to calf muscles  . Oxycodone Itching  . Statins Other (See Comments)    Muscle pain   . Vytorin [Ezetimibe-Simvastatin] Other (See Comments)    Damage to calf muscles    ROS Review of Systems  Constitutional: Negative.   HENT: Negative.   Eyes: Negative.   Respiratory: Negative.   Cardiovascular: Negative.   Gastrointestinal: Negative.   Endocrine: Negative.   Genitourinary: Negative.   Skin: Negative.   Allergic/Immunologic: Negative.   Neurological: Negative.   Hematological: Negative.   Psychiatric/Behavioral: Negative.   All other systems reviewed and are negative.     Objective:    Physical Exam Vitals and nursing note reviewed.  Constitutional:      General: He is not in acute distress.    Appearance: Normal appearance. He is normal weight. He is  not ill-appearing, toxic-appearing or diaphoretic.  HENT:     Ears:     Comments: Baseline hearing loss Neck:     Vascular: No carotid bruit.   Cardiovascular:     Rate and Rhythm: Normal rate and regular rhythm.  Pulmonary:     Effort: Pulmonary effort is normal. No respiratory distress.  Musculoskeletal:     Cervical back: Normal range of motion. No rigidity or tenderness.  Lymphadenopathy:     Cervical: No cervical adenopathy.  Skin:    General: Skin is warm and dry.     Capillary Refill: Capillary refill takes less than 2 seconds.  Neurological:     General: No focal deficit present.     Mental Status: He is alert and oriented to person, place, and time. Mental status is at baseline.  Psychiatric:        Mood and Affect: Mood normal.        Behavior: Behavior normal.        Thought Content: Thought content normal.        Judgment: Judgment normal.     BP 102/66   Pulse 72   Temp 97.8 F (36.6 C) (Temporal)   Resp 18   Ht _0  (1.676 m)   Wt 156 lb 6.4 oz (70.9 kg)   SpO2 95%   BMI 25.24 kg/m  Wt Readings from Last 3 Encounters:  06/12/20 156 lb 6.4 oz (70.9 kg)  05/21/20 155 lb (70.3 kg)  05/14/20 153 lb 12.8 oz (69.8 kg)     There are no preventive care reminders to display for this patient.  There are no preventive care reminders to display for this patient.  Lab Results  Component Value Date   TSH 0.797 05/14/2020   Lab Results  Component Value Date   WBC 6.6 05/14/2020   HGB 14.3 05/14/2020   HCT 42.2 05/14/2020   MCV 89 05/14/2020   PLT 216 03/02/2020   Lab Results  Component Value Date   NA 137 05/14/2020   K 4.5 05/14/2020   CO2 19 (L) 05/14/2020   GLUCOSE 110 (H) 05/14/2020   BUN 14 05/14/2020   CREATININE 0.84 05/14/2020   BILITOT 0.9 05/14/2020   ALKPHOS 47 (L) 05/14/2020   AST 15 05/14/2020   ALT 14 05/14/2020   PROT 6.5 05/14/2020   ALBUMIN 4.5 05/14/2020   CALCIUM 8.7 05/14/2020   ANIONGAP 12 03/06/2020   GFR 83.56  03/29/2018   Lab Results  Component Value Date   CHOL 175 05/14/2020   Lab Results  Component Value Date   HDL 59 05/14/2020   Lab Results  Component Value Date   LDLCALC 98 05/14/2020   Lab Results  Component Value Date   TRIG 102 05/14/2020   Lab Results  Component Value Date   CHOLHDL 3.0 05/14/2020   Lab Results  Component Value Date   HGBA1C 6.2 (H) 05/14/2020      Assessment & Plan:   Problem List Items Addressed This Visit    None    Visit Diagnoses    Need for prophylactic vaccination and inoculation against influenza    -  Primary   Relevant Orders   Flu Vaccine QUAD 6+ mos PF IM (Fluarix Quad PF) (Completed)   Nausea without vomiting       Relevant Medications   ondansetron (ZOFRAN-ODT) 4 MG disintegrating tablet   Neck mass       Relevant Orders   US SOFT TISSUE NECK      Meds ordered this encounter  Medications  . ondansetron (ZOFRAN-ODT) 4 MG disintegrating tablet    Sig: Take 2 tablets (8 mg total) by mouth as needed.    Dispense:  45 tablet    Refill:  0    Follow-up: No follow-ups on file.   Plan  Unclear etiology for neck mass. Feels too superficial to be thyroid, but not displaying typical symptoms of abscess. Would not expect such symmetry in lymphadenopathy. Will start further work up with [place].  Refill zofran  Continue follow up with specialists  Return in 2 mo  Patient encouraged to call clinic with any questions, comments, or concerns.  Maximiano Coss, NP

## 2020-06-20 ENCOUNTER — Ambulatory Visit
Admission: RE | Admit: 2020-06-20 | Discharge: 2020-06-20 | Disposition: A | Discharge status: Home / Self Care | Payer: Medicare Other | Source: Ambulatory Visit | Attending: Registered Nurse | Admitting: Registered Nurse

## 2020-06-20 ENCOUNTER — Other Ambulatory Visit: Payer: Medicare Other

## 2020-06-20 DIAGNOSIS — R221 Localized swelling, mass and lump, neck: Secondary | ICD-10-CM

## 2020-06-24 ENCOUNTER — Other Ambulatory Visit: Payer: Self-pay | Admitting: Registered Nurse

## 2020-06-24 DIAGNOSIS — R221 Localized swelling, mass and lump, neck: Secondary | ICD-10-CM

## 2020-07-02 ENCOUNTER — Encounter (INDEPENDENT_AMBULATORY_CARE_PROVIDER_SITE_OTHER): Payer: Medicare Other | Admitting: Ophthalmology

## 2020-07-08 ENCOUNTER — Encounter: Payer: Self-pay | Admitting: Registered Nurse

## 2020-07-08 NOTE — Progress Notes (Signed)
Established Patient Office Visit  Subjective:  Patient ID: Christian [name], male    DOB: 03/21/1941  Age: 79 y.o. MRN: [id number]  CC:  Chief Complaint  Patient presents with   Anxiety    patient states he has been having some severe anxiety lately GAD7=21 he states he get a weird feeling in his stomach about something and it get worse. He also have trouble breathing when having issues with anxiety. Per patient he also have a rash on left arm for 1 week    HPI Christian [name] presents for anxiety and rash.   Anxiety: worse since cva. Feels near panic at times. Worse at night. Does not use sleep aid. Notes sleep is poor. Has not been on anxiolytic in the past. Interested in options.  Rash: red, flat, itchy on L forearm for about a week. Not worsening or improving. No vesicles or drainage. No contact with irritants to his knowledge.   Past Medical History:  Diagnosis Date   Allergic rhinitis due to pollen 7/61/6073   Chronic systolic heart failure ([place]) - EF improved to normal on Rx    Echo 11/18: Mild LVH, EF 20, diffuse HK mild AI, a sending aorta 42 mm (mildly dilated), mild MR, mild TR, trivial PI, PASP 44 // Echo 3/19: mild LVH, EF 25-30, diff HK, Gr 1 DD, asc aorta 43 mm (mildly dilated), MAC, mild reduced RVSF // Limited echo 10/19: Mild concentric LVH, EF 71-06, grade 1 diastolic dysfunction, MAC, mild LAE    Coronary artery disease involving native coronary artery of native heart without angina pectoris 11/11/2017   R/L HC 11/18: pLAD 45, oD1 65 ; elevated PCWP (mean 29), moderate pulmonary hypertension, CO 3.66, CI 2.07   Depression with anxiety 07/08/2017   Essential hypertension 07/08/2017   GERD with esophagitis 01/11/2018   Hernia of abdominal cavity    Hyperlipidemia    Hyperlipidemia LDL goal <130 05/25/2017   The 10-year ASCVD risk score Mikey Bussing DC Jr., et al., 2013) is: 31.1%   Values used to calculate the score:     Age: 29 years     Sex: Male     Is Non-Hispanic  African American: No     Diabetic: No     Tobacco smoker: No     Systolic Blood Pressure: 269 mmHg     Is BP treated: No     HDL Cholesterol: 68.7 mg/dL     Total Cholesterol: 258 mg/dL    Idiopathic inflammatory myopathy 05/25/2017   Neuropathy involving both lower extremities 05/25/2017   Nonischemic cardiomyopathy ([place]) 11/11/2017   LHC 09/16/17-nonobstructive CAD   Oropharyngeal dysphagia 01/11/2018   Type 2 diabetes mellitus with complication, without long-term current use of insulin ([place]) 07/08/2017    Past Surgical History:  Procedure Laterality Date   BACK SURGERY     HERNIA REPAIR     RIGHT/LEFT HEART CATH AND CORONARY ANGIOGRAPHY N/A 09/16/2017   Procedure: RIGHT/LEFT HEART CATH AND CORONARY ANGIOGRAPHY;  Surgeon: Martinique, Peter M, MD;  Location: [facility];  Service: Cardiovascular;  Laterality: N/A;   SPINE SURGERY      Family History  Problem Relation Age of Onset   Stroke Mother    Kidney disease Father    Stroke Father    Hypertension Father    Early death Father    Cancer Neg Hx    Alcohol abuse Neg Hx    Diabetes Neg Hx    Heart disease Neg Hx  Hyperlipidemia Neg Hx     Social History   Socioeconomic History   Marital status: Single    Spouse name: Not on file   Number of children: Not on file   Years of education: Not on file   Highest education level: Not on file  Occupational History   Not on file  Tobacco Use   Smoking status: Never Smoker   Smokeless tobacco: Never Used  Vaping Use   Vaping Use: Never used  Substance and Sexual Activity   Alcohol use: No   Drug use: No   Sexual activity: Not Currently  Other Topics Concern   Not on file  Social History Narrative   Not on file   Social Determinants of Health   Financial Resource Strain:    Difficulty of Paying Living Expenses: Not on file  Food Insecurity:    Worried About [place] in the Last Year: Not on file   Ran Out of Food in the  Last Year: Not on file  Transportation Needs:    Lack of Transportation (Medical): Not on file   Lack of Transportation (Non-Medical): Not on file  Physical Activity:    Days of Exercise per Week: Not on file   Minutes of Exercise per Session: Not on file  Stress:    Feeling of Stress : Not on file  Social Connections:    Frequency of Communication with Friends and Family: Not on file   Frequency of Social Gatherings with Friends and Family: Not on file   Attends Religious Services: Not on file   Active Member of Clubs or Organizations: Not on file   Attends Archivist Meetings: Not on file   Marital Status: Not on file  Intimate Partner Violence:    Fear of Current or Ex-Partner: Not on file   Emotionally Abused: Not on file   Physically Abused: Not on file   Sexually Abused: Not on file    Outpatient Medications Prior to Visit  Medication Sig Dispense Refill   acetaminophen (TYLENOL) 325 MG tablet Take 2 tablets (650 mg total) by mouth every 4 (four) hours as needed for mild pain (or temp > 37.5 C (99.5 F)).     carvedilol (COREG) 3.125 MG tablet Take 1 tablet (3.125 mg total) by mouth daily. 30 tablet 0   cholecalciferol (VITAMIN D) 1000 units tablet Take 1 tablet (1,000 Units total) by mouth daily. 30 tablet 0   esomeprazole (NEXIUM) 40 MG capsule Take 1 capsule (40 mg total) by mouth as needed (for heartburn). 30 capsule 5   furosemide (LASIX) 40 MG tablet Take 0.5 tablets (20 mg total) by mouth 3 (three) times a week. 30 tablet 6   olopatadine (PATANOL) 0.1 % ophthalmic solution INSTILL 1 DROP INTO BOTH EYS DAILY AS NEEDED FOR ALLERGIES 5 mL 2   sacubitril-valsartan (ENTRESTO) 24-26 MG Take 1 tablet by mouth 2 (two) times daily. 180 tablet 3   acetic acid 2 % otic solution Place 4 drops into both ears 3 (three) times daily. 15 mL 0   aspirin EC 81 MG tablet Take 1 tablet (81 mg total) by mouth daily. 90 tablet 3   clopidogrel (PLAVIX) 75 MG  tablet Take 1 tablet (75 mg total) by mouth daily. 30 tablet 1   rosuvastatin (CRESTOR) 5 MG tablet Take 1 tablet (5 mg total) by mouth daily. 90 tablet 3   No facility-administered medications prior to visit.    Allergies  Allergen Reactions  Amoxicillin Hives   Bactrim [Sulfamethoxazole-Trimethoprim] Other (See Comments)    Dried out his eyes   Cephalexin Itching   Hydrocodone Itching   Lipitor [Atorvastatin] Other (See Comments)    Damage to calf muscles   Oxycodone Itching   Statins Other (See Comments)    Muscle pain    Vytorin [Ezetimibe-Simvastatin] Other (See Comments)    Damage to calf muscles    ROS Review of Systems  Constitutional: Negative.   HENT: Negative.   Eyes: Negative.   Respiratory: Negative.   Cardiovascular: Negative.   Gastrointestinal: Negative.   Genitourinary: Negative.   Musculoskeletal: Negative.   Skin: Positive for rash. Negative for color change, pallor and wound.  Neurological: Negative.   Psychiatric/Behavioral: Positive for sleep disturbance. Negative for dysphoric mood, hallucinations, self-injury and suicidal ideas. The patient is nervous/anxious. The patient is not hyperactive.       Objective:    Physical Exam Constitutional:      General: He is not in acute distress.    Appearance: Normal appearance. He is normal weight. He is not ill-appearing, toxic-appearing or diaphoretic.  Cardiovascular:     Rate and Rhythm: Normal rate and regular rhythm.     Heart sounds: Normal heart sounds. No murmur heard.  No friction rub. No gallop.   Pulmonary:     Effort: Pulmonary effort is normal. No respiratory distress.     Breath sounds: Normal breath sounds. No stridor. No wheezing, rhonchi or rales.  Chest:     Chest wall: No tenderness.  Neurological:     General: No focal deficit present.     Mental Status: He is alert and oriented to person, place, and time. Mental status is at baseline.  Psychiatric:        Mood and  Affect: Mood normal.        Behavior: Behavior normal.        Thought Content: Thought content normal.        Judgment: Judgment normal.     BP 114/76    Pulse 88    Temp 97.8 F (36.6 C) (Temporal)    Resp 17    Ht _0  (1.676 m)    Wt 160 lb 3.2 oz (72.7 kg)    SpO2 98%    BMI 25.86 kg/m  Wt Readings from Last 3 Encounters:  06/12/20 156 lb 6.4 oz (70.9 kg)  05/21/20 155 lb (70.3 kg)  05/14/20 153 lb 12.8 oz (69.8 kg)     There are no preventive care reminders to display for this patient.  There are no preventive care reminders to display for this patient.  Lab Results  Component Value Date   TSH 0.797 05/14/2020   Lab Results  Component Value Date   WBC 6.6 05/14/2020   HGB 14.3 05/14/2020   HCT 42.2 05/14/2020   MCV 89 05/14/2020   PLT 216 03/02/2020   Lab Results  Component Value Date   NA 137 05/14/2020   K 4.5 05/14/2020   CO2 19 (L) 05/14/2020   GLUCOSE 110 (H) 05/14/2020   BUN 14 05/14/2020   CREATININE 0.84 05/14/2020   BILITOT 0.9 05/14/2020   ALKPHOS 47 (L) 05/14/2020   AST 15 05/14/2020   ALT 14 05/14/2020   PROT 6.5 05/14/2020   ALBUMIN 4.5 05/14/2020   CALCIUM 8.7 05/14/2020   ANIONGAP 12 03/06/2020   GFR 83.56 03/29/2018   Lab Results  Component Value Date   CHOL 175 05/14/2020   Lab Results  Component Value Date   HDL 59 05/14/2020   Lab Results  Component Value Date   LDLCALC 98 05/14/2020   Lab Results  Component Value Date   TRIG 102 05/14/2020   Lab Results  Component Value Date   CHOLHDL 3.0 05/14/2020   Lab Results  Component Value Date   HGBA1C 6.2 (H) 05/14/2020      Assessment & Plan:   Problem List Items Addressed This Visit    None    Visit Diagnoses    GAD (generalized anxiety disorder)    -  Primary   Rash and nonspecific skin eruption          Meds ordered this encounter  Medications   DISCONTD: triamcinolone cream (KENALOG) 0.1 %    Sig: Apply 1 application topically 2 (two) times daily.     Dispense:  30 g    Refill:  0    Order Specific Question:   Supervising Provider    Answer:   Carlota Raspberry, JEFFREY R [[id number]]   DISCONTD: escitalopram (LEXAPRO) 5 MG tablet    Sig: Take 1 tablet (5 mg total) by mouth daily.    Dispense:  60 tablet    Refill:  0    Order Specific Question:   Supervising Provider    Answer:   Carlota Raspberry, JEFFREY R [[id number]]    Follow-up: No follow-ups on file.   PLAN  Uncertain origin of rash. Try triamcinolone and follow up as needed. Will consider derm in the future. Does not seem to be infectious or infestation  Will start lexapro 51m PO qhs. May consider increase in 4-6 weeks to 172m Will continue to follow up with pt routinely  Patient encouraged to call clinic with any questions, comments, or concerns.  RiMaximiano CossNP

## 2020-07-09 ENCOUNTER — Ambulatory Visit
Admission: RE | Admit: 2020-07-09 | Discharge: 2020-07-09 | Disposition: A | Discharge status: Home / Self Care | Payer: Medicare Other | Source: Ambulatory Visit | Attending: Registered Nurse | Admitting: Registered Nurse

## 2020-07-09 ENCOUNTER — Other Ambulatory Visit: Payer: Self-pay

## 2020-07-09 DIAGNOSIS — R221 Localized swelling, mass and lump, neck: Secondary | ICD-10-CM | POA: Diagnosis not present

## 2020-07-09 MED ORDER — IOPAMIDOL (ISOVUE-300) INJECTION 61%
75.0000 mL | Freq: Once | INTRAVENOUS | Status: AC | PRN
  Administered 2020-07-09: 75 mL via INTRAVENOUS

## 2020-07-10 ENCOUNTER — Encounter: Payer: Self-pay | Admitting: Registered Nurse

## 2020-07-10 ENCOUNTER — Telehealth: Payer: Self-pay | Admitting: Registered Nurse

## 2020-07-10 NOTE — Telephone Encounter (Signed)
Message was sent to [facility] about this.

## 2020-07-10 NOTE — Telephone Encounter (Signed)
Tedra Coupe called from [facility] needing orders faxed over, pt has pt for his neck today. The order has to have the dx on ir. fax [phone], please advise vanda at [phone].

## 2020-07-12 NOTE — Telephone Encounter (Signed)
Pt wife is calling about this and wants to make sure the orders were put in for the muscles in the side of his neck so they don't have to drive so fair and not being able to have the appt. Pts wife would like a call regarding that we have sent that in. Please advise.

## 2020-07-16 ENCOUNTER — Encounter: Payer: Self-pay | Admitting: Registered Nurse

## 2020-07-16 NOTE — Progress Notes (Signed)
Established Patient Office Visit  Subjective:  Patient ID: Christian [name], male    DOB: Mar 19, 1941  Age: 79 y.o. MRN: [id number]  CC:  Chief Complaint  Patient presents with  . Insomnia    Patient states that for about one month he has been having some sleep issues. He lay down for a few but not getting proper sleep. Per patient he needs a sleep aid or something    HPI Christian [name] presents for ongoing trouble sleeping  Has trouble falling asleep and staying asleep Thinks this may be related to anxiety Has not been on sleep aids in past, has not had trouble sleeping in past.  No cognitive changes at night No snoring/ hx of apnea/current symptoms of apnea  Otherwise no complaints  Past Medical History:  Diagnosis Date  . Allergic rhinitis due to pollen 03/29/2018  . Chronic systolic heart failure (HCC) - EF improved to normal on Rx    Echo 11/18: Mild LVH, EF 20, diffuse HK mild AI, a sending aorta 42 mm (mildly dilated), mild MR, mild TR, trivial PI, PASP 44 // Echo 3/19: mild LVH, EF 25-30, diff HK, Gr 1 DD, asc aorta 43 mm (mildly dilated), MAC, mild reduced RVSF // Limited echo 10/19: Mild concentric LVH, EF 99-37, grade 1 diastolic dysfunction, MAC, mild LAE   . Coronary artery disease involving native coronary artery of native heart without angina pectoris 11/11/2017   R/L HC 11/18: pLAD 45, oD1 65 ; elevated PCWP (mean 29), moderate pulmonary hypertension, CO 3.66, CI 2.07  . Depression with anxiety 07/08/2017  . Essential hypertension 07/08/2017  . GERD with esophagitis 01/11/2018  . Hernia of abdominal cavity   . Hyperlipidemia   . Hyperlipidemia LDL goal <130 05/25/2017   The 10-year ASCVD risk score Mikey Bussing DC Jr., et al., 2013) is: 31.1%   Values used to calculate the score:     Age: 34 years     Sex: Male     Is Non-Hispanic African American: No     Diabetic: No     Tobacco smoker: No     Systolic Blood Pressure: 169 mmHg     Is BP treated: No     HDL Cholesterol: 68.7 mg/dL      Total Cholesterol: 258 mg/dL   . Idiopathic inflammatory myopathy 05/25/2017  . Neuropathy involving both lower extremities 05/25/2017  . Nonischemic cardiomyopathy ([place]) 11/11/2017   LHC 09/16/17-nonobstructive CAD  . Oropharyngeal dysphagia 01/11/2018  . Type 2 diabetes mellitus with complication, without long-term current use of insulin ([place]) 07/08/2017    Past Surgical History:  Procedure Laterality Date  . BACK SURGERY    . HERNIA REPAIR    . RIGHT/LEFT HEART CATH AND CORONARY ANGIOGRAPHY N/A 09/16/2017   Procedure: RIGHT/LEFT HEART CATH AND CORONARY ANGIOGRAPHY;  Surgeon: Martinique, Peter M, MD;  Location: [facility];  Service: Cardiovascular;  Laterality: N/A;  . SPINE SURGERY      Family History  Problem Relation Age of Onset  . Stroke Mother   . Kidney disease Father   . Stroke Father   . Hypertension Father   . Early death Father   . Cancer Neg Hx   . Alcohol abuse Neg Hx   . Diabetes Neg Hx   . Heart disease Neg Hx   . Hyperlipidemia Neg Hx     Social History   Socioeconomic History  . Marital status: Single    Spouse name: Not on file  . Number of  children: Not on file  . Years of education: Not on file  . Highest education level: Not on file  Occupational History  . Not on file  Tobacco Use  . Smoking status: Never Smoker  . Smokeless tobacco: Never Used  Vaping Use  . Vaping Use: Never used  Substance and Sexual Activity  . Alcohol use: No  . Drug use: No  . Sexual activity: Not Currently  Other Topics Concern  . Not on file  Social History Narrative  . Not on file   Social Determinants of Health   Financial Resource Strain:   . Difficulty of Paying Living Expenses: Not on file  Food Insecurity:   . Worried About [employer] fundraiser in the Last Year: Not on file  . Ran Out of Food in the Last Year: Not on file  Transportation Needs:   . Lack of Transportation (Medical): Not on file  . Lack of Transportation (Non-Medical): Not on file    Physical Activity:   . Days of Exercise per Week: Not on file  . Minutes of Exercise per Session: Not on file  Stress:   . Feeling of Stress : Not on file  Social Connections:   . Frequency of Communication with Friends and Family: Not on file  . Frequency of Social Gatherings with Friends and Family: Not on file  . Attends Religious Services: Not on file  . Active Member of Clubs or Organizations: Not on file  . Attends Archivist Meetings: Not on file  . Marital Status: Not on file  Intimate Partner Violence:   . Fear of Current or Ex-Partner: Not on file  . Emotionally Abused: Not on file  . Physically Abused: Not on file  . Sexually Abused: Not on file    Outpatient Medications Prior to Visit  Medication Sig Dispense Refill  . acetaminophen (TYLENOL) 325 MG tablet Take 2 tablets (650 mg total) by mouth every 4 (four) hours as needed for mild pain (or temp > 37.5 C (99.5 F)).    . carvedilol (COREG) 3.125 MG tablet Take 1 tablet (3.125 mg total) by mouth daily. 30 tablet 0  . cholecalciferol (VITAMIN D) 1000 units tablet Take 1 tablet (1,000 Units total) by mouth daily. 30 tablet 0  . esomeprazole (NEXIUM) 40 MG capsule Take 1 capsule (40 mg total) by mouth as needed (for heartburn). 30 capsule 5  . furosemide (LASIX) 40 MG tablet Take 0.5 tablets (20 mg total) by mouth 3 (three) times a week. 30 tablet 6  . olopatadine (PATANOL) 0.1 % ophthalmic solution INSTILL 1 DROP INTO BOTH EYS DAILY AS NEEDED FOR ALLERGIES 5 mL 2  . Omega-3 Fatty Acids (FISH OIL) 1000 MG CAPS Take 1 capsule (1,000 mg total) by mouth every morning. 1 capsule 0  . sacubitril-valsartan (ENTRESTO) 24-26 MG Take 1 tablet by mouth 2 (two) times daily. 180 tablet 3  . acetic acid 2 % otic solution Place 4 drops into both ears 3 (three) times daily. 15 mL 0  . acetic acid-aluminum acetate (DOMEBORO OTIC) 2 % OTIC solution Place in ear(s).    Marland Kitchen aspirin EC 81 MG tablet Take 1 tablet (81 mg total) by mouth  daily. 90 tablet 3  . clopidogrel (PLAVIX) 75 MG tablet Take 1 tablet (75 mg total) by mouth daily. 30 tablet 1  . escitalopram (LEXAPRO) 10 MG tablet Take 1 tablet (10 mg total) by mouth daily. 90 tablet 0  . rosuvastatin (CRESTOR) 5 MG  tablet Take 1 tablet (5 mg total) by mouth daily. 90 tablet 3  . triamcinolone cream (KENALOG) 0.1 % Apply 1 application topically 2 (two) times daily. 30 g 0   No facility-administered medications prior to visit.    Allergies  Allergen Reactions  . Amoxicillin Hives  . Bactrim [Sulfamethoxazole-Trimethoprim] Other (See Comments)    Dried out his eyes  . Cephalexin Itching  . Hydrocodone Itching  . Lipitor [Atorvastatin] Other (See Comments)    Damage to calf muscles  . Oxycodone Itching  . Statins Other (See Comments)    Muscle pain   . Vytorin [Ezetimibe-Simvastatin] Other (See Comments)    Damage to calf muscles    ROS Review of Systems  Constitutional: Negative.   HENT: Negative.   Eyes: Negative.   Respiratory: Negative.   Cardiovascular: Negative.   Gastrointestinal: Negative.   Genitourinary: Negative.   Musculoskeletal: Negative.   Skin: Negative.   Neurological: Negative.   Psychiatric/Behavioral: Negative.       Objective:    Physical Exam Constitutional:      General: He is not in acute distress.    Appearance: Normal appearance. He is normal weight. He is not ill-appearing, toxic-appearing or diaphoretic.  Cardiovascular:     Rate and Rhythm: Normal rate and regular rhythm.     Heart sounds: Normal heart sounds. No murmur heard.  No friction rub. No gallop.   Pulmonary:     Effort: Pulmonary effort is normal. No respiratory distress.     Breath sounds: Normal breath sounds. No stridor. No wheezing, rhonchi or rales.  Chest:     Chest wall: No tenderness.  Neurological:     General: No focal deficit present.     Mental Status: He is alert and oriented to person, place, and time. Mental status is at baseline.    Psychiatric:        Mood and Affect: Mood normal.        Behavior: Behavior normal.        Thought Content: Thought content normal.        Judgment: Judgment normal.     BP 123/78   Pulse 73   Temp 98.7 F (37.1 C) (Temporal)   Resp 18   Ht _0  (1.676 m)   Wt 153 lb 14.4 oz (69.8 kg)   SpO2 96%   BMI 24.84 kg/m  Wt Readings from Last 3 Encounters:  06/12/20 156 lb 6.4 oz (70.9 kg)  05/21/20 155 lb (70.3 kg)  05/14/20 153 lb 12.8 oz (69.8 kg)     There are no preventive care reminders to display for this patient.  There are no preventive care reminders to display for this patient.  Lab Results  Component Value Date   TSH 0.797 05/14/2020   Lab Results  Component Value Date   WBC 6.6 05/14/2020   HGB 14.3 05/14/2020   HCT 42.2 05/14/2020   MCV 89 05/14/2020   PLT 216 03/02/2020   Lab Results  Component Value Date   NA 137 05/14/2020   K 4.5 05/14/2020   CO2 19 (L) 05/14/2020   GLUCOSE 110 (H) 05/14/2020   BUN 14 05/14/2020   CREATININE 0.84 05/14/2020   BILITOT 0.9 05/14/2020   ALKPHOS 47 (L) 05/14/2020   AST 15 05/14/2020   ALT 14 05/14/2020   PROT 6.5 05/14/2020   ALBUMIN 4.5 05/14/2020   CALCIUM 8.7 05/14/2020   ANIONGAP 12 03/06/2020   GFR 83.56 03/29/2018   Lab Results  Component Value Date   CHOL 175 05/14/2020   Lab Results  Component Value Date   HDL 59 05/14/2020   Lab Results  Component Value Date   LDLCALC 98 05/14/2020   Lab Results  Component Value Date   TRIG 102 05/14/2020   Lab Results  Component Value Date   CHOLHDL 3.0 05/14/2020   Lab Results  Component Value Date   HGBA1C 6.2 (H) 05/14/2020      Assessment & Plan:   Problem List Items Addressed This Visit    None    Visit Diagnoses    Infective otitis externa of both ears    -  Primary   Relevant Medications   NEOMYCIN-POLYMYXIN-HYDROCORTISONE (CORTISPORIN) 1 % SOLN OTIC solution   Sleep disturbance       Relevant Medications   traZODone (DESYREL) 50  MG tablet   GAD (generalized anxiety disorder)       Relevant Medications   escitalopram (LEXAPRO) 20 MG tablet   traZODone (DESYREL) 50 MG tablet      Meds ordered this encounter  Medications  . escitalopram (LEXAPRO) 20 MG tablet    Sig: Take 1 tablet (20 mg total) by mouth daily.    Dispense:  90 tablet    Refill:  1    Order Specific Question:   Supervising Provider    AnswerPamella Pert, Lilia Argue [[id number]]  . traZODone (DESYREL) 50 MG tablet    Sig: Take 0.5-1 tablets (25-50 mg total) by mouth at bedtime as needed for sleep.    Dispense:  30 tablet    Refill:  3    Order Specific Question:   Supervising Provider    AnswerPamella Pert, Lilia Argue [[id number]]  . NEOMYCIN-POLYMYXIN-HYDROCORTISONE (CORTISPORIN) 1 % SOLN OTIC solution    Sig: Place 3 drops into both ears every 8 (eight) hours.    Dispense:  10 mL    Refill:  0    Order Specific Question:   Supervising Provider    AnswerRutherford Guys [[id number]]    Follow-up: No follow-ups on file.   PLAN  Increase escitalopram to 71m PO qd in mornings. If sedation occurs at higher dose, ok to half pills and take 145mdaily.  Trazodone 25-5065mO qhs PRN for sleep aid. Discussed safety in using sedative medications with 65+ - he has no history of wandering at night, sleep walking, and does not frequently wake to urinate.   Return in 1-2 mo for med check, sooner if needed  Refill cortisporin for ongoing ear itching  Patient encouraged to call clinic with any questions, comments, or concerns.   RicMaximiano CossP

## 2020-07-17 ENCOUNTER — Encounter: Payer: Self-pay | Admitting: Registered Nurse

## 2020-07-17 DIAGNOSIS — M542 Cervicalgia: Secondary | ICD-10-CM | POA: Diagnosis not present

## 2020-07-17 NOTE — Progress Notes (Signed)
Acute Office Visit  Subjective:    Patient ID: Christian [name], male    DOB: 03/16/1941, 79 y.o.   MRN: [id number]  Chief Complaint  Patient presents with   Toe Pain    patient states he thinks he is getting a infection between his toes. and slso would like to discuss getting some more labs.    HPI Patient is in today for rash between toes. Concern for infection. No drainage or breaks in the skin. No spreading redness. No hx of these symptoms. No exposure to irritants that he is aware of.   Also would like to repeat some general labs. No acute concerns but wants to monitor levels of sugars, blood counts, liver and kidney function, thyroid, and cholesterol.  Past Medical History:  Diagnosis Date   Allergic rhinitis due to pollen 2/70/3500   Chronic systolic heart failure ([place]) - EF improved to normal on Rx    Echo 11/18: Mild LVH, EF 20, diffuse HK mild AI, a sending aorta 42 mm (mildly dilated), mild MR, mild TR, trivial PI, PASP 44 // Echo 3/19: mild LVH, EF 25-30, diff HK, Gr 1 DD, asc aorta 43 mm (mildly dilated), MAC, mild reduced RVSF // Limited echo 10/19: Mild concentric LVH, EF 93-81, grade 1 diastolic dysfunction, MAC, mild LAE    Coronary artery disease involving native coronary artery of native heart without angina pectoris 11/11/2017   R/L HC 11/18: pLAD 45, oD1 65 ; elevated PCWP (mean 29), moderate pulmonary hypertension, CO 3.66, CI 2.07   Depression with anxiety 07/08/2017   Essential hypertension 07/08/2017   GERD with esophagitis 01/11/2018   Hernia of abdominal cavity    Hyperlipidemia    Hyperlipidemia LDL goal <130 05/25/2017   The 10-year ASCVD risk score Mikey Bussing DC Jr., et al., 2013) is: 31.1%   Values used to calculate the score:     Age: 71 years     Sex: Male     Is Non-Hispanic African American: No     Diabetic: No     Tobacco smoker: No     Systolic Blood Pressure: 829 mmHg     Is BP treated: No     HDL Cholesterol: 68.7 mg/dL     Total Cholesterol: 258  mg/dL    Idiopathic inflammatory myopathy 05/25/2017   Neuropathy involving both lower extremities 05/25/2017   Nonischemic cardiomyopathy ([place]) 11/11/2017   LHC 09/16/17-nonobstructive CAD   Oropharyngeal dysphagia 01/11/2018   Type 2 diabetes mellitus with complication, without long-term current use of insulin ([place]) 07/08/2017    Past Surgical History:  Procedure Laterality Date   BACK SURGERY     HERNIA REPAIR     RIGHT/LEFT HEART CATH AND CORONARY ANGIOGRAPHY N/A 09/16/2017   Procedure: RIGHT/LEFT HEART CATH AND CORONARY ANGIOGRAPHY;  Surgeon: Martinique, Peter M, MD;  Location: [facility];  Service: Cardiovascular;  Laterality: N/A;   SPINE SURGERY      Family History  Problem Relation Age of Onset   Stroke Mother    Kidney disease Father    Stroke Father    Hypertension Father    Early death Father    Cancer Neg Hx    Alcohol abuse Neg Hx    Diabetes Neg Hx    Heart disease Neg Hx    Hyperlipidemia Neg Hx     Social History   Socioeconomic History   Marital status: Single    Spouse name: Not on file   Number of children: Not on  file   Years of education: Not on file   Highest education level: Not on file  Occupational History   Not on file  Tobacco Use   Smoking status: Never Smoker   Smokeless tobacco: Never Used  Vaping Use   Vaping Use: Never used  Substance and Sexual Activity   Alcohol use: No   Drug use: No   Sexual activity: Not Currently  Other Topics Concern   Not on file  Social History Narrative   Not on file   Social Determinants of Health   Financial Resource Strain:    Difficulty of Paying Living Expenses: Not on file  Food Insecurity:    Worried About [place] in the Last Year: Not on file   Ran Out of Food in the Last Year: Not on file  Transportation Needs:    Lack of Transportation (Medical): Not on file   Lack of Transportation (Non-Medical): Not on file  Physical Activity:     Days of Exercise per Week: Not on file   Minutes of Exercise per Session: Not on file  Stress:    Feeling of Stress : Not on file  Social Connections:    Frequency of Communication with Friends and Family: Not on file   Frequency of Social Gatherings with Friends and Family: Not on file   Attends Religious Services: Not on file   Active Member of Clubs or Organizations: Not on file   Attends Archivist Meetings: Not on file   Marital Status: Not on file  Intimate Partner Violence:    Fear of Current or Ex-Partner: Not on file   Emotionally Abused: Not on file   Physically Abused: Not on file   Sexually Abused: Not on file    Outpatient Medications Prior to Visit  Medication Sig Dispense Refill   acetaminophen (TYLENOL) 325 MG tablet Take 2 tablets (650 mg total) by mouth every 4 (four) hours as needed for mild pain (or temp > 37.5 C (99.5 F)).     carvedilol (COREG) 3.125 MG tablet Take 1 tablet (3.125 mg total) by mouth daily. 30 tablet 0   cholecalciferol (VITAMIN D) 1000 units tablet Take 1 tablet (1,000 Units total) by mouth daily. 30 tablet 0   escitalopram (LEXAPRO) 20 MG tablet Take 1 tablet (20 mg total) by mouth daily. 90 tablet 1   esomeprazole (NEXIUM) 40 MG capsule Take 1 capsule (40 mg total) by mouth as needed (for heartburn). 30 capsule 5   furosemide (LASIX) 40 MG tablet Take 0.5 tablets (20 mg total) by mouth 3 (three) times a week. 30 tablet 6   NEOMYCIN-POLYMYXIN-HYDROCORTISONE (CORTISPORIN) 1 % SOLN OTIC solution Place 3 drops into both ears every 8 (eight) hours. 10 mL 0   olopatadine (PATANOL) 0.1 % ophthalmic solution INSTILL 1 DROP INTO BOTH EYS DAILY AS NEEDED FOR ALLERGIES 5 mL 2   Omega-3 Fatty Acids (FISH OIL) 1000 MG CAPS Take 1 capsule (1,000 mg total) by mouth every morning. 1 capsule 0   sacubitril-valsartan (ENTRESTO) 24-26 MG Take 1 tablet by mouth 2 (two) times daily. 180 tablet 3   traZODone (DESYREL) 50 MG tablet Take  0.5-1 tablets (25-50 mg total) by mouth at bedtime as needed for sleep. 30 tablet 3   acetic acid 2 % otic solution Place 4 drops into both ears 3 (three) times daily. 15 mL 0   acetic acid-aluminum acetate (DOMEBORO OTIC) 2 % OTIC solution Place in ear(s).  aspirin EC 81 MG tablet Take 1 tablet (81 mg total) by mouth daily. 90 tablet 3   clopidogrel (PLAVIX) 75 MG tablet Take 1 tablet (75 mg total) by mouth daily. 30 tablet 1   rosuvastatin (CRESTOR) 5 MG tablet Take 1 tablet (5 mg total) by mouth daily. 90 tablet 3   triamcinolone cream (KENALOG) 0.1 % Apply 1 application topically 2 (two) times daily. 30 g 0   No facility-administered medications prior to visit.    Allergies  Allergen Reactions   Amoxicillin Hives   Bactrim [Sulfamethoxazole-Trimethoprim] Other (See Comments)    Dried out his eyes   Cephalexin Itching   Hydrocodone Itching   Lipitor [Atorvastatin] Other (See Comments)    Damage to calf muscles   Oxycodone Itching   Statins Other (See Comments)    Muscle pain    Vytorin [Ezetimibe-Simvastatin] Other (See Comments)    Damage to calf muscles    Review of Systems Per hpi      Objective:    Physical Exam Constitutional:      Appearance: Normal appearance.  Cardiovascular:     Rate and Rhythm: Normal rate and regular rhythm.     Heart sounds: Normal heart sounds. No murmur heard.  No friction rub. No gallop.   Pulmonary:     Effort: Pulmonary effort is normal. No respiratory distress.     Breath sounds: Normal breath sounds. No stridor. No wheezing, rhonchi or rales.  Chest:     Chest wall: No tenderness.  Skin:    General: Skin is warm and dry.     Coloration: Skin is not jaundiced.     Findings: Erythema (between toes) present. No bruising or lesion.  Neurological:     General: No focal deficit present.     Mental Status: He is alert and oriented to person, place, and time. Mental status is at baseline.  Psychiatric:        Mood  and Affect: Mood normal.        Behavior: Behavior normal.        Thought Content: Thought content normal.        Judgment: Judgment normal.     BP 111/69    Pulse 69    Temp 97.7 F (36.5 C) (Temporal)    Resp 18    Ht _0  (1.676 m)    Wt 153 lb 12.8 oz (69.8 kg)    SpO2 95%    BMI 24.82 kg/m  Wt Readings from Last 3 Encounters:  06/12/20 156 lb 6.4 oz (70.9 kg)  05/21/20 155 lb (70.3 kg)  05/14/20 153 lb 12.8 oz (69.8 kg)    There are no preventive care reminders to display for this patient.  There are no preventive care reminders to display for this patient.   Lab Results  Component Value Date   TSH 0.797 05/14/2020   Lab Results  Component Value Date   WBC 6.6 05/14/2020   HGB 14.3 05/14/2020   HCT 42.2 05/14/2020   MCV 89 05/14/2020   PLT 216 03/02/2020   Lab Results  Component Value Date   NA 137 05/14/2020   K 4.5 05/14/2020   CO2 19 (L) 05/14/2020   GLUCOSE 110 (H) 05/14/2020   BUN 14 05/14/2020   CREATININE 0.84 05/14/2020   BILITOT 0.9 05/14/2020   ALKPHOS 47 (L) 05/14/2020   AST 15 05/14/2020   ALT 14 05/14/2020   PROT 6.5 05/14/2020   ALBUMIN 4.5 05/14/2020   CALCIUM  8.7 05/14/2020   ANIONGAP 12 03/06/2020   GFR 83.56 03/29/2018   Lab Results  Component Value Date   CHOL 175 05/14/2020   Lab Results  Component Value Date   HDL 59 05/14/2020   Lab Results  Component Value Date   LDLCALC 98 05/14/2020   Lab Results  Component Value Date   TRIG 102 05/14/2020   Lab Results  Component Value Date   CHOLHDL 3.0 05/14/2020   Lab Results  Component Value Date   HGBA1C 6.2 (H) 05/14/2020       Assessment & Plan:   Problem List Items Addressed This Visit      Cardiovascular and Mediastinum   Chronic systolic heart failure (HCC)   Relevant Orders   CBC With Differential (Completed)   Comprehensive metabolic panel (Completed)   Lipid panel (Completed)   TSH (Completed)   Glucose (CBG) (Completed)   Hemoglobin A1c (Completed)      Other Visit Diagnoses    Screening for endocrine, metabolic and immunity disorder    -  Primary   Relevant Orders   CBC With Differential (Completed)   Comprehensive metabolic panel (Completed)   TSH (Completed)   Glucose (CBG) (Completed)   Hemoglobin A1c (Completed)   Lipid screening       Relevant Orders   Lipid panel (Completed)   Hemoglobin A1c (Completed)   Rash and nonspecific skin eruption           Meds ordered this encounter  Medications   DISCONTD: triamcinolone cream (KENALOG) 0.1 %    Sig: Apply 1 application topically 2 (two) times daily.    Dispense:  30 g    Refill:  0    Order Specific Question:   Supervising Provider    Answer:   Carlota Raspberry, JEFFREY R [[id number]]   DISCONTD: triamcinolone cream (KENALOG) 0.1 %    Sig: Apply 1 application topically 2 (two) times daily.    Dispense:  30 g    Refill:  0    Order Specific Question:   Supervising Provider    Answer:   Carlota Raspberry, JEFFREY R [[id number]]   PLAN  Appears as irritation, perhaps from moisture, between toes. Doubt bacterial infection.   Will send triamcinolone to apply topically twice daily as needed. Keep feet clean and dry  Labs collected, will follow up as warranted  Patient encouraged to call clinic with any questions, comments, or concerns.   Maximiano Coss, NP

## 2020-07-18 ENCOUNTER — Ambulatory Visit: Payer: Medicare Other | Admitting: Adult Health

## 2020-07-27 ENCOUNTER — Other Ambulatory Visit: Payer: Self-pay | Admitting: Neurology

## 2020-08-01 NOTE — Telephone Encounter (Signed)
Pt called  states his need the result of the CT done on his neck On sep Please Advice

## 2020-08-03 NOTE — Telephone Encounter (Signed)
Pt would like to know the results of his CT of the neck please advise

## 2020-08-03 NOTE — Telephone Encounter (Signed)
Spoke with wife and reported her and she said they would discuss any questions concerns the 26th later this month

## 2020-08-03 NOTE — Telephone Encounter (Signed)
We can call him - they only noted some stiffening of the arteries in his neck, which is not of major concern at this time. Appears as though the bump is benign - it did not warrant concern on CT   Thank you  Kathrin Ruddy, NP

## 2020-08-08 DIAGNOSIS — M542 Cervicalgia: Secondary | ICD-10-CM | POA: Diagnosis not present

## 2020-08-10 ENCOUNTER — Other Ambulatory Visit: Payer: Self-pay | Admitting: Registered Nurse

## 2020-08-10 DIAGNOSIS — F411 Generalized anxiety disorder: Secondary | ICD-10-CM

## 2020-08-14 ENCOUNTER — Ambulatory Visit (INDEPENDENT_AMBULATORY_CARE_PROVIDER_SITE_OTHER): Payer: Medicare Other | Admitting: Registered Nurse

## 2020-08-14 ENCOUNTER — Encounter: Payer: Self-pay | Admitting: Registered Nurse

## 2020-08-14 ENCOUNTER — Ambulatory Visit: Payer: Medicare Other | Admitting: Registered Nurse

## 2020-08-14 ENCOUNTER — Other Ambulatory Visit: Payer: Self-pay

## 2020-08-14 VITALS — BP 145/82 | HR 77 | Temp 98.0°F | Resp 18 | Ht 66.0 in | Wt 160.8 lb

## 2020-08-14 DIAGNOSIS — R198 Other specified symptoms and signs involving the digestive system and abdomen: Secondary | ICD-10-CM

## 2020-08-14 DIAGNOSIS — R0989 Other specified symptoms and signs involving the circulatory and respiratory systems: Secondary | ICD-10-CM

## 2020-08-14 DIAGNOSIS — I6930 Unspecified sequelae of cerebral infarction: Secondary | ICD-10-CM | POA: Diagnosis not present

## 2020-08-14 DIAGNOSIS — I63531 Cerebral infarction due to unspecified occlusion or stenosis of right posterior cerebral artery: Secondary | ICD-10-CM

## 2020-08-14 DIAGNOSIS — F331 Major depressive disorder, recurrent, moderate: Secondary | ICD-10-CM | POA: Diagnosis not present

## 2020-08-14 NOTE — Patient Instructions (Signed)
° ° ° °  If you have lab work done today you will be contacted with your lab results within the next 2 weeks.  If you have not heard from us then please contact us. The fastest way to get your results is to register for My Chart. ° ° °IF you received an x-ray today, you will receive an invoice from [facility]. Please contact [facility] at [phone] with questions or concerns regarding your invoice.  ° °IF you received labwork today, you will receive an invoice from [facility]. Please contact [facility] at [phone] with questions or concerns regarding your invoice.  ° °Our billing staff will not be able to assist you with questions regarding bills from these companies. ° °You will be contacted with the lab results as soon as they are available. The fastest way to get your results is to activate your My Chart account. Instructions are located on the last page of this paperwork. If you have not heard from us regarding the results in 2 weeks, please contact this office. °  ° ° ° °

## 2020-08-16 DIAGNOSIS — M542 Cervicalgia: Secondary | ICD-10-CM | POA: Diagnosis not present

## 2020-08-21 ENCOUNTER — Other Ambulatory Visit: Payer: Medicare Other

## 2020-09-17 ENCOUNTER — Other Ambulatory Visit: Payer: Self-pay

## 2020-09-17 ENCOUNTER — Encounter (INDEPENDENT_AMBULATORY_CARE_PROVIDER_SITE_OTHER): Payer: Medicare Other | Admitting: Ophthalmology

## 2020-09-17 DIAGNOSIS — H353111 Nonexudative age-related macular degeneration, right eye, early dry stage: Secondary | ICD-10-CM

## 2020-09-17 DIAGNOSIS — H353122 Nonexudative age-related macular degeneration, left eye, intermediate dry stage: Secondary | ICD-10-CM | POA: Diagnosis not present

## 2020-09-17 DIAGNOSIS — H43813 Vitreous degeneration, bilateral: Secondary | ICD-10-CM

## 2020-09-21 ENCOUNTER — Other Ambulatory Visit: Payer: Self-pay | Admitting: *Deleted

## 2020-09-21 MED ORDER — ENTRESTO 24-26 MG PO TABS: 1.0000 | ORAL_TABLET | Freq: Two times a day (BID) | ORAL | 3 refills | Status: DC

## 2020-10-01 ENCOUNTER — Telehealth: Payer: Self-pay

## 2020-10-01 NOTE — Telephone Encounter (Signed)
Pt brought [employer] Pt Asst paperwork by our office. I completed the provider portion and had Dr Acie Fredrickson sign. Application has been faxed.

## 2020-10-30 NOTE — Telephone Encounter (Signed)
Letter received from [employer] pt asst foundation stating that they approved the pt for asst with his Entresto. Approval is valid for the remainder of 2022. Patient ID: [id number]  The letter states that they have notified the pt of this approval as well.

## 2020-11-02 ENCOUNTER — Encounter: Payer: Self-pay | Admitting: Registered Nurse

## 2020-11-02 NOTE — Progress Notes (Signed)
Established Patient Office Visit  Subjective:  Patient ID: Christian [name], male    DOB: 05/14/1941  Age: 80 y.o. MRN: [id number]  CC:  Chief Complaint  Patient presents with  . Follow-up    3 month follow up. PAtient would like to discuss some paperwork and also throat issue.    HPI Christian [name] presents for 3 mo follow up    Wants to discuss DNR/DNI, healthcare power of attorney, and other paperwork Is adamant that he wants no life saving measures done Denies HI/SI but states that he may consider pursuing physician assisted suicide if that were an option in [place].  No plan  Also wants to discuss throat issue.  Globus sensation Ongoing for some time Has not been taking GERD meds - esomeprazole available at home No choking, weight changes, appetite changes, nausea or vomiting.  Past Medical History:  Diagnosis Date  . Allergic rhinitis due to pollen 03/29/2018  . Chronic systolic heart failure (HCC) - EF improved to normal on Rx    Echo 11/18: Mild LVH, EF 20, diffuse HK mild AI, a sending aorta 42 mm (mildly dilated), mild MR, mild TR, trivial PI, PASP 44 // Echo 3/19: mild LVH, EF 25-30, diff HK, Gr 1 DD, asc aorta 43 mm (mildly dilated), MAC, mild reduced RVSF // Limited echo 10/19: Mild concentric LVH, EF 57-84, grade 1 diastolic dysfunction, MAC, mild LAE   . Coronary artery disease involving native coronary artery of native heart without angina pectoris 11/11/2017   R/L HC 11/18: pLAD 45, oD1 65 ; elevated PCWP (mean 29), moderate pulmonary hypertension, CO 3.66, CI 2.07  . Depression with anxiety 07/08/2017  . Essential hypertension 07/08/2017  . GERD with esophagitis 01/11/2018  . Hernia of abdominal cavity   . Hyperlipidemia   . Hyperlipidemia LDL goal <130 05/25/2017   The 10-year ASCVD risk score Mikey Bussing DC Jr., et al., 2013) is: 31.1%   Values used to calculate the score:     Age: 36 years     Sex: Male     Is Non-Hispanic African American: No     Diabetic: No     Tobacco  smoker: No     Systolic Blood Pressure: 696 mmHg     Is BP treated: No     HDL Cholesterol: 68.7 mg/dL     Total Cholesterol: 258 mg/dL   . Idiopathic inflammatory myopathy 05/25/2017  . Neuropathy involving both lower extremities 05/25/2017  . Nonischemic cardiomyopathy ([place]) 11/11/2017   LHC 09/16/17-nonobstructive CAD  . Oropharyngeal dysphagia 01/11/2018  . Type 2 diabetes mellitus with complication, without long-term current use of insulin ([place]) 07/08/2017    Past Surgical History:  Procedure Laterality Date  . BACK SURGERY    . HERNIA REPAIR    . RIGHT/LEFT HEART CATH AND CORONARY ANGIOGRAPHY N/A 09/16/2017   Procedure: RIGHT/LEFT HEART CATH AND CORONARY ANGIOGRAPHY;  Surgeon: Martinique, Peter M, MD;  Location: [facility];  Service: Cardiovascular;  Laterality: N/A;  . SPINE SURGERY      Family History  Problem Relation Age of Onset  . Stroke Mother   . Kidney disease Father   . Stroke Father   . Hypertension Father   . Early death Father   . Cancer Neg Hx   . Alcohol abuse Neg Hx   . Diabetes Neg Hx   . Heart disease Neg Hx   . Hyperlipidemia Neg Hx     Social History   Socioeconomic History  . Marital  status: Single    Spouse name: Not on file  . Number of children: Not on file  . Years of education: Not on file  . Highest education level: Not on file  Occupational History  . Not on file  Tobacco Use  . Smoking status: Never Smoker  . Smokeless tobacco: Never Used  Vaping Use  . Vaping Use: Never used  Substance and Sexual Activity  . Alcohol use: No  . Drug use: No  . Sexual activity: Not Currently  Other Topics Concern  . Not on file  Social History Narrative  . Not on file   Social Determinants of Health   Financial Resource Strain: Not on file  Food Insecurity: Not on file  Transportation Needs: Not on file  Physical Activity: Not on file  Stress: Not on file  Social Connections: Not on file  Intimate Partner Violence: Not on file     Outpatient Medications Prior to Visit  Medication Sig Dispense Refill  . acetaminophen (TYLENOL) 325 MG tablet Take 2 tablets (650 mg total) by mouth every 4 (four) hours as needed for mild pain (or temp > 37.5 C (99.5 F)).    . carvedilol (COREG) 3.125 MG tablet Take 1 tablet (3.125 mg total) by mouth daily. 30 tablet 0  . cholecalciferol (VITAMIN D) 1000 units tablet Take 1 tablet (1,000 Units total) by mouth daily. 30 tablet 0  . clopidogrel (PLAVIX) 75 MG tablet Take 1 tablet (75 mg total) by mouth daily. 90 tablet 3  . [employer] GAS RELIEF EXTRA STRENGTH 125 MG chewable tablet Chew 125 mg by mouth as needed.    Marland Kitchen escitalopram (LEXAPRO) 20 MG tablet Take 1 tablet (20 mg total) by mouth daily. 90 tablet 1  . esomeprazole (NEXIUM) 40 MG capsule Take 1 capsule (40 mg total) by mouth as needed (for heartburn). 30 capsule 5  . furosemide (LASIX) 40 MG tablet Take 0.5 tablets (20 mg total) by mouth 3 (three) times a week. 30 tablet 6  . NEOMYCIN-POLYMYXIN-HYDROCORTISONE (CORTISPORIN) 1 % SOLN OTIC solution Place 3 drops into both ears every 8 (eight) hours. 10 mL 0  . olopatadine (PATANOL) 0.1 % ophthalmic solution INSTILL 1 DROP INTO BOTH EYS DAILY AS NEEDED FOR ALLERGIES 5 mL 2  . Omega-3 Fatty Acids (FISH OIL) 1000 MG CAPS TAKE 1 CAPSULE (1,000 MG TOTAL) BY MOUTH EVERY MORNING. 120 capsule 0  . ondansetron (ZOFRAN-ODT) 4 MG disintegrating tablet Take 2 tablets (8 mg total) by mouth as needed. 45 tablet 0  . rosuvastatin (CRESTOR) 10 MG tablet Take 1 tablet (10 mg total) by mouth daily. 90 tablet 1  . traZODone (DESYREL) 50 MG tablet Take 0.5-1 tablets (25-50 mg total) by mouth at bedtime as needed for sleep. 30 tablet 3  . triamcinolone cream (KENALOG) 0.1 % Apply 1 application topically daily.    . sacubitril-valsartan (ENTRESTO) 24-26 MG Take 1 tablet by mouth 2 (two) times daily. 180 tablet 3   No facility-administered medications prior to visit.    Allergies  Allergen Reactions  .  Amoxicillin Hives  . Bactrim [Sulfamethoxazole-Trimethoprim] Other (See Comments)    Dried out his eyes  . Cephalexin Itching  . Hydrocodone Itching  . Lipitor [Atorvastatin] Other (See Comments)    Damage to calf muscles  . Oxycodone Itching  . Statins Other (See Comments)    Muscle pain   . Vytorin [Ezetimibe-Simvastatin] Other (See Comments)    Damage to calf muscles    ROS Review of Systems  Constitutional: Negative.   HENT: Negative.   Eyes: Negative.   Respiratory: Negative.   Cardiovascular: Negative.   Gastrointestinal: Negative.   Endocrine: Negative.   Genitourinary: Negative.   Musculoskeletal: Negative.   Skin: Negative.   Allergic/Immunologic: Negative.   Neurological: Negative.   Psychiatric/Behavioral: Negative.   All other systems reviewed and are negative.     Objective:    Physical Exam Constitutional:      General: He is not in acute distress.    Appearance: Normal appearance. He is normal weight. He is not ill-appearing, toxic-appearing or diaphoretic.  Cardiovascular:     Rate and Rhythm: Normal rate and regular rhythm.     Heart sounds: Normal heart sounds. No murmur heard. No friction rub. No gallop.   Pulmonary:     Effort: Pulmonary effort is normal. No respiratory distress.     Breath sounds: Normal breath sounds. No stridor. No wheezing, rhonchi or rales.  Chest:     Chest wall: No tenderness.  Neurological:     General: No focal deficit present.     Mental Status: He is alert and oriented to person, place, and time. Mental status is at baseline.  Psychiatric:        Mood and Affect: Mood normal.        Behavior: Behavior normal.        Thought Content: Thought content normal.        Judgment: Judgment normal.     BP (!) 145/82   Pulse 77   Temp 98 F (36.7 C) (Temporal)   Resp 18   Ht _0  (1.676 m)   Wt 160 lb 12.8 oz (72.9 kg)   SpO2 98%   BMI 25.95 kg/m  Wt Readings from Last 3 Encounters:  08/14/20 160 lb 12.8 oz  (72.9 kg)  06/12/20 156 lb 6.4 oz (70.9 kg)  05/21/20 155 lb (70.3 kg)     Health Maintenance Due  Topic Date Due  . COVID-19 Vaccine (3 - Booster for Pfizer series) 07/24/2020    There are no preventive care reminders to display for this patient.  Lab Results  Component Value Date   TSH 0.797 05/14/2020   Lab Results  Component Value Date   WBC 6.6 05/14/2020   HGB 14.3 05/14/2020   HCT 42.2 05/14/2020   MCV 89 05/14/2020   PLT 216 03/02/2020   Lab Results  Component Value Date   NA 137 05/14/2020   K 4.5 05/14/2020   CO2 19 (L) 05/14/2020   GLUCOSE 110 (H) 05/14/2020   BUN 14 05/14/2020   CREATININE 0.84 05/14/2020   BILITOT 0.9 05/14/2020   ALKPHOS 47 (L) 05/14/2020   AST 15 05/14/2020   ALT 14 05/14/2020   PROT 6.5 05/14/2020   ALBUMIN 4.5 05/14/2020   CALCIUM 8.7 05/14/2020   ANIONGAP 12 03/06/2020   GFR 83.56 03/29/2018   Lab Results  Component Value Date   CHOL 175 05/14/2020   Lab Results  Component Value Date   HDL 59 05/14/2020   Lab Results  Component Value Date   LDLCALC 98 05/14/2020   Lab Results  Component Value Date   TRIG 102 05/14/2020   Lab Results  Component Value Date   CHOLHDL 3.0 05/14/2020   Lab Results  Component Value Date   HGBA1C 6.2 (H) 05/14/2020      Assessment & Plan:   Problem List Items Addressed This Visit      Cardiovascular and Mediastinum   Acute  ischemic right posterior cerebral artery (PCA) stroke ([place])    Other Visit Diagnoses    Globus sensation    -  Primary   Moderate episode of recurrent major depressive disorder ([place])          No orders of the defined types were placed in this encounter.   Follow-up: No follow-ups on file.   PLAN  Discussed processes for paperwork with patient - helped fill out what I could, made a copy for [place] to keep on file. Pt has family member who is physician who is also assisting this process - I am limited in what I can do in this regard as I am involved  directly in the patient's care.  Restart esomeprazole, if no relief, will consider speech pathology referral or swallow study  Patient encouraged to call clinic with any questions, comments, or concerns.  Maximiano Coss, NP

## 2020-11-21 ENCOUNTER — Encounter (INDEPENDENT_AMBULATORY_CARE_PROVIDER_SITE_OTHER): Payer: Medicare Other | Admitting: Ophthalmology

## 2020-12-03 ENCOUNTER — Ambulatory Visit: Payer: Medicare Other | Admitting: Cardiovascular Disease

## 2020-12-03 ENCOUNTER — Other Ambulatory Visit: Payer: Self-pay

## 2020-12-05 ENCOUNTER — Encounter (INDEPENDENT_AMBULATORY_CARE_PROVIDER_SITE_OTHER): Payer: Medicare Other | Admitting: Ophthalmology

## 2020-12-24 ENCOUNTER — Encounter: Payer: Self-pay | Admitting: Cardiovascular Disease

## 2020-12-24 ENCOUNTER — Other Ambulatory Visit: Payer: Self-pay

## 2020-12-24 ENCOUNTER — Ambulatory Visit: Payer: Medicare Other | Admitting: Cardiovascular Disease

## 2020-12-24 VITALS — BP 112/78 | HR 70 | Ht 66.0 in | Wt 166.2 lb

## 2020-12-24 DIAGNOSIS — I10 Essential (primary) hypertension: Secondary | ICD-10-CM

## 2020-12-24 DIAGNOSIS — Z8673 Personal history of transient ischemic attack (TIA), and cerebral infarction without residual deficits: Secondary | ICD-10-CM

## 2020-12-24 DIAGNOSIS — E785 Hyperlipidemia, unspecified: Secondary | ICD-10-CM | POA: Diagnosis not present

## 2020-12-24 DIAGNOSIS — I2510 Atherosclerotic heart disease of native coronary artery without angina pectoris: Secondary | ICD-10-CM

## 2020-12-24 DIAGNOSIS — I5022 Chronic systolic (congestive) heart failure: Secondary | ICD-10-CM | POA: Diagnosis not present

## 2020-12-24 LAB — COMPREHENSIVE METABOLIC PANEL
ALT: 22 IU/L (ref 0–44)
AST: 18 IU/L (ref 0–40)
Albumin/Globulin Ratio: 2 (ref 1.2–2.2)
Albumin: 4.2 g/dL (ref 3.7–4.7)
Alkaline Phosphatase: 50 IU/L (ref 44–121)
BUN/Creatinine Ratio: 20 (ref 10–24)
BUN: 18 mg/dL (ref 8–27)
Bilirubin Total: 0.8 mg/dL (ref 0.0–1.2)
CO2: 22 mmol/L (ref 20–29)
Calcium: 8.8 mg/dL (ref 8.6–10.2)
Chloride: 104 mmol/L (ref 96–106)
Creatinine, Ser: 0.9 mg/dL (ref 0.76–1.27)
Globulin, Total: 2.1 g/dL (ref 1.5–4.5)
Glucose: 106 mg/dL — ABNORMAL HIGH (ref 65–99)
Potassium: 4.6 mmol/L (ref 3.5–5.2)
Sodium: 140 mmol/L (ref 134–144)
Total Protein: 6.3 g/dL (ref 6.0–8.5)
eGFR: 86 mL/min/{1.73_m2} (ref >59)

## 2020-12-24 LAB — LIPID PANEL
Chol/HDL Ratio: 3.2 ratio (ref 0.0–5.0)
Cholesterol, Total: 163 mg/dL (ref 100–199)
HDL: 51 mg/dL (ref >39)
LDL Chol Calc (NIH): 87 mg/dL (ref 0–99)
Triglycerides: 142 mg/dL (ref 0–149)
VLDL Cholesterol Cal: 25 mg/dL (ref 5–40)

## 2020-12-24 NOTE — Patient Instructions (Signed)
Medication Instructions:  Your physician recommends that you continue on your current medications as directed. Please refer to the Current Medication list given to you today.  *If you need a refill on your cardiac medications before your next appointment, please call your pharmacy*   Lab Work: TODAY: BMET, ALT, Lipids If you have labs (blood work) drawn today and your tests are completely normal, you will receive your results only by: Marland Kitchen MyChart Message (if you have MyChart) OR . A paper copy in the mail If you have any lab test that is abnormal or we need to change your treatment, we will call you to review the results.   Testing/Procedures: none   Follow-Up: At [facility], you and your health needs are our priority.  As part of our continuing mission to provide you with exceptional heart care, we have created designated Provider Care Teams.  These Care Teams include your primary Cardiologist (physician) and Advanced Practice Providers (APPs -  Physician Assistants and Nurse Practitioners) who all work together to provide you with the care you need, when you need it.  Your next appointment:   1 year(s)  The format for your next appointment:   In Person  Provider:   You may see Mertie Moores, MD or one of the following Advanced Practice Providers on your designated Care Team:    Richardson Dopp, PA-C  [place], [place]

## 2020-12-24 NOTE — Progress Notes (Signed)
Cardiology Office Note:    Date:  12/24/2020   ID:  Christian [name], DOB Feb 11, 1941, MRN [id number]  PCP:  Maximiano Coss, NP  Cardiologist:  Mertie Moores, MD   Referring MD: Maximiano Coss, NP   Problem list  1.  Hypertension 2.  Hyperlipidemia 3.  Diabetes mellitus 4.  Chronic systolic congestive heart failure -  EF 20% in Nov. 2018  Normal coronaries 5.  Myopathy - ? Due to statins    Chief Complaint  Patient presents with  . Coronary Artery Disease       . Congestive Heart Failure     December 18, 2017    Christian [name] is a 80 y.o. male with a hx of congestive heart failure, hypertension, hyperlipidemia.  Met him in the hospital in Nov. 2018.  Was found to have CHF - EF 20%. No CP  Hyperlipidemia  - took statins in the rermote past ,  Has prolonged muscle pains in response to statins  Has been taking prednisone on occasion Has been on Coreg and Losartan   September 14, 2019:  Christian [name] is seen today for follow-up of his chronic systolic congestive heart failure. When I saw him last year we started him on Entresto. His left-ventricular function has improved ( from 30% - 65%) by echo in October, 2019.  He has a history of diabetes mellitus hypertension hyperlipidemia. Been intolerant to statins.  Feels well .    No CP , no dyspnea   December 24, 2020: Christian [name] is seen today for a follow-up visit regarding his congestive heart failure, hypertension, hyperlipidemia. His original ejection fraction was 20%. He has been on Entresto and his ejection fraction has normalized.  Feeling great  Had a stroke in May.  Has recovered fairly well.   Is now on Plavix    Past Medical History:  Diagnosis Date  . Allergic rhinitis due to pollen 03/29/2018  . Chronic systolic heart failure (HCC) - EF improved to normal on Rx    Echo 11/18: Mild LVH, EF 20, diffuse HK mild AI, a sending aorta 42 mm (mildly dilated), mild MR, mild TR, trivial PI, PASP 44 // Echo 3/19: mild LVH, EF 25-30, diff  HK, Gr 1 DD, asc aorta 43 mm (mildly dilated), MAC, mild reduced RVSF // Limited echo 10/19: Mild concentric LVH, EF 62-83, grade 1 diastolic dysfunction, MAC, mild LAE   . Coronary artery disease involving native coronary artery of native heart without angina pectoris 11/11/2017   R/L HC 11/18: pLAD 45, oD1 65 ; elevated PCWP (mean 29), moderate pulmonary hypertension, CO 3.66, CI 2.07  . Depression with anxiety 07/08/2017  . Essential hypertension 07/08/2017  . GERD with esophagitis 01/11/2018  . Hernia of abdominal cavity   . Hyperlipidemia   . Hyperlipidemia LDL goal <130 05/25/2017   The 10-year ASCVD risk score Mikey Bussing DC Jr., et al., 2013) is: 31.1%   Values used to calculate the score:     Age: 80 years     Sex: Male     Is Non-Hispanic African American: No     Diabetic: No     Tobacco smoker: No     Systolic Blood Pressure: 662 mmHg     Is BP treated: No     HDL Cholesterol: 68.7 mg/dL     Total Cholesterol: 258 mg/dL   . Idiopathic inflammatory myopathy 05/25/2017  . Neuropathy involving both lower extremities 05/25/2017  . Nonischemic cardiomyopathy ([place]) 11/11/2017   LHC 09/16/17-nonobstructive CAD  . Oropharyngeal  dysphagia 01/11/2018  . Type 2 diabetes mellitus with complication, without long-term current use of insulin ([place]) 07/08/2017    Past Surgical History:  Procedure Laterality Date  . BACK SURGERY    . HERNIA REPAIR    . RIGHT/LEFT HEART CATH AND CORONARY ANGIOGRAPHY N/A 09/16/2017   Procedure: RIGHT/LEFT HEART CATH AND CORONARY ANGIOGRAPHY;  Surgeon: Martinique, Peter M, MD;  Location: [facility];  Service: Cardiovascular;  Laterality: N/A;  . SPINE SURGERY      Current Medications: Current Meds  Medication Sig  . acetaminophen (TYLENOL) 325 MG tablet Take 2 tablets (650 mg total) by mouth every 4 (four) hours as needed for mild pain (or temp > 37.5 C (99.5 F)).  . carvedilol (COREG) 3.125 MG tablet Take 1 tablet (3.125 mg total) by mouth daily.  . cholecalciferol (VITAMIN  D) 1000 units tablet Take 1 tablet (1,000 Units total) by mouth daily.  . clopidogrel (PLAVIX) 75 MG tablet Take 1 tablet (75 mg total) by mouth daily.  . [employer]S GAS RELIEF EXTRA STRENGTH 125 MG chewable tablet Chew 125 mg by mouth as needed.  Marland Kitchen escitalopram (LEXAPRO) 20 MG tablet Take 1 tablet (20 mg total) by mouth daily.  Marland Kitchen esomeprazole (NEXIUM) 40 MG capsule Take 1 capsule (40 mg total) by mouth as needed (for heartburn).  . furosemide (LASIX) 40 MG tablet Take 0.5 tablets (20 mg total) by mouth 3 (three) times a week.  . NEOMYCIN-POLYMYXIN-HYDROCORTISONE (CORTISPORIN) 1 % SOLN OTIC solution Place 3 drops into both ears every 8 (eight) hours.  Marland Kitchen olopatadine (PATANOL) 0.1 % ophthalmic solution INSTILL 1 DROP INTO BOTH EYS DAILY AS NEEDED FOR ALLERGIES  . Omega-3 Fatty Acids (FISH OIL) 1000 MG CAPS TAKE 1 CAPSULE (1,000 MG TOTAL) BY MOUTH EVERY MORNING.  Marland Kitchen ondansetron (ZOFRAN-ODT) 4 MG disintegrating tablet Take 2 tablets (8 mg total) by mouth as needed.  . rosuvastatin (CRESTOR) 10 MG tablet Take 1 tablet (10 mg total) by mouth daily.  . sacubitril-valsartan (ENTRESTO) 24-26 MG Take 1 tablet by mouth 2 (two) times daily.  . traZODone (DESYREL) 50 MG tablet Take 0.5-1 tablets (25-50 mg total) by mouth at bedtime as needed for sleep.  Marland Kitchen triamcinolone cream (KENALOG) 0.1 % Apply 1 application topically daily.     Allergies:   Amoxicillin, Bactrim [sulfamethoxazole-trimethoprim], Cephalexin, Hydrocodone, Lipitor [atorvastatin], Oxycodone, Statins, and Vytorin [ezetimibe-simvastatin]   Social History   Socioeconomic History  . Marital status: Single    Spouse name: Not on file  . Number of children: Not on file  . Years of education: Not on file  . Highest education level: Not on file  Occupational History  . Not on file  Tobacco Use  . Smoking status: Never Smoker  . Smokeless tobacco: Never Used  Vaping Use  . Vaping Use: Never used  Substance and Sexual Activity  . Alcohol use: No  .  Drug use: No  . Sexual activity: Not Currently  Other Topics Concern  . Not on file  Social History Narrative  . Not on file   Social Determinants of Health   Financial Resource Strain: Not on file  Food Insecurity: Not on file  Transportation Needs: Not on file  Physical Activity: Not on file  Stress: Not on file  Social Connections: Not on file     Family History: The patient's family history includes Early death in his father; Hypertension in his father; Kidney disease in his father; Stroke in his father and mother. There is no  history of Cancer, Alcohol abuse, Diabetes, Heart disease, or Hyperlipidemia.  ROS:   Please see the history of present illness.     All other systems reviewed and are negative.  EKGs/Labs/Other Studies Reviewed:    The following studies were reviewed today:     Recent Labs: 03/02/2020: Platelets 216 05/14/2020: ALT 14; BUN 14; Creatinine, Ser 0.84; Hemoglobin 14.3; Potassium 4.5; Sodium 137; TSH 0.797  Recent Lipid Panel    Component Value Date/Time   CHOL 175 05/14/2020 1529   TRIG 102 05/14/2020 1529   HDL 59 05/14/2020 1529   CHOLHDL 3.0 05/14/2020 1529   CHOLHDL 4 07/08/2017 1212   VLDL 21.8 07/08/2017 1212   LDLCALC 98 05/14/2020 1529    Physical Exam: Blood pressure 112/78, pulse 70, height _0  (1.676 m), weight 166 lb 3.2 oz (75.4 kg), SpO2 96 %.  GEN:  Elderly male, NAD  HEENT: Normal NECK: No JVD; No carotid bruits LYMPHATICS: No lymphadenopathy CARDIAC: RRR , no murmurs, rubs, gallops RESPIRATORY:  Clear to auscultation without rales, wheezing or rhonchi  ABDOMEN: Soft, non-tender, non-distended MUSCULOSKELETAL:  No edema; No deformity  SKIN: Warm and dry NEUROLOGIC:  Alert and oriented x 3   EKG:     December 24, 2020:  NSR at 70 , no St or T wave abn.    ASSESSMENT:    1. Chronic systolic heart failure ([place])   2. Coronary artery disease involving native coronary artery of native heart without angina pectoris   3.  History of CVA (cerebrovascular accident)   4. Essential hypertension   5. Hyperlipidemia LDL goal <130    PLAN:       1.  Chronic systolic congestive heart failure:   EF has normalized on Entresto.  He seems to be doing well.  Cont current meds.     2.  Hyperlipidemia:  Check labs today   3 CVA:  Cont plavix .   Medication Adjustments/Labs and Tests Ordered: Current medicines are reviewed at length with the patient today.  Concerns regarding medicines are outlined above.  Orders Placed This Encounter  Procedures  . EKG 12-Lead   No orders of the defined types were placed in this encounter.   Signed, Mertie Moores, MD  12/24/2020 2:35 PM    [facility]

## 2020-12-25 ENCOUNTER — Other Ambulatory Visit: Payer: Self-pay | Admitting: *Deleted

## 2020-12-25 DIAGNOSIS — E785 Hyperlipidemia, unspecified: Secondary | ICD-10-CM

## 2021-01-07 NOTE — Progress Notes (Signed)
Patient ID: Christian [name]                 DOB: [birth year]                    MRN: [id number]     HPI: Christian [name] is a 80 y.o. male patient referred to lipid clinic by Dr. Acie Fredrickson. PMH is significant for CAD, CVA in 2021, CHF, HTN, HLD, and T2DM. Pt seen in lipid clinic in 2019 in which baseline LDL was 197 and approved for Praluent through patient assistance. However, pt did not start medication after reading about the side effects. Pt adamantly refused to take injections despite discussion of risks vs benefits. Ultimately, patient was agreeable to start rosuvastatin 43m daily.  Patient presents today in good spirits accompanied with his wife. Reports medication adherence with rosuvastatin 10 mg daily. Denies side-effects. Reports previous discussion with Praluent (never started) and not interested in starting medication after reading about side effects. Discussed diet and exercise below.  Current Medications: rosuvastatin 10 mg daily, omega-3 fatty acid 1000 mg daily Intolerances: Pitavastatin 2 mg daily, ezetimibe-simvastatin (Vytorin) and atorvastatin -  damage to calf muscles Risk Factors: CAD, CVA, CHF, T2DM, HTN, HLD LDL goal: <55 mg/dL  Diet: stuffed peppers, sausage and potatoes, salads, frozen dinners with macaroni and vegetables, chinese, broccoli, mussels, cauliflower, asparagus; diet coke, water, lite grape juice  Exercise: plan to restart using home gym 2-3x/week  Family History: Early death in his father; Hypertension in his father; Kidney disease in his father; Stroke in his father and mother  Social History: denies tobacco use  Labs: 12/24/20: LDL 87, TC 163, TG 142, HDL 51 (rosuvastatin 10 mg daily)  Past Medical History:  Diagnosis Date  . Allergic rhinitis due to pollen 03/29/2018  . Chronic systolic heart failure (HCC) - EF improved to normal on Rx    Echo 11/18: Mild LVH, EF 20, diffuse HK mild AI, a sending aorta 42 mm (mildly dilated), mild MR, mild TR, trivial PI,  PASP 44 // Echo 3/19: mild LVH, EF 25-30, diff HK, Gr 1 DD, asc aorta 43 mm (mildly dilated), MAC, mild reduced RVSF // Limited echo 10/19: Mild concentric LVH, EF 678-93 grade 1 diastolic dysfunction, MAC, mild LAE   . Coronary artery disease involving native coronary artery of native heart without angina pectoris 11/11/2017   R/L HC 11/18: pLAD 45, oD1 65 ; elevated PCWP (mean 29), moderate pulmonary hypertension, CO 3.66, CI 2.07  . Depression with anxiety 07/08/2017  . Essential hypertension 07/08/2017  . GERD with esophagitis 01/11/2018  . Hernia of abdominal cavity   . Hyperlipidemia   . Hyperlipidemia LDL goal <130 05/25/2017   The 10-year ASCVD risk score (Mikey BussingDC Jr., et al., 2013) is: 31.1%   Values used to calculate the score:     Age: [age]years     Sex: Male     Is Non-Hispanic African American: No     Diabetic: No     Tobacco smoker: No     Systolic Blood Pressure: 1810mmHg     Is BP treated: No     HDL Cholesterol: 68.7 mg/dL     Total Cholesterol: 258 mg/dL   . Idiopathic inflammatory myopathy 05/25/2017  . Neuropathy involving both lower extremities 05/25/2017  . [place]ischemic cardiomyopathy ([place] 11/11/2017   LHC 09/16/17-nonobstructive CAD  . Oropharyngeal dysphagia 01/11/2018  . Type 2 diabetes mellitus with complication, without long-term current use of insulin ([place] 07/08/2017  Current Outpatient Medications on File Prior to Visit  Medication Sig Dispense Refill  . acetaminophen (TYLENOL) 325 MG tablet Take 2 tablets (650 mg total) by mouth every 4 (four) hours as needed for mild pain (or temp > 37.5 C (99.5 F)).    . carvedilol (COREG) 3.125 MG tablet Take 1 tablet (3.125 mg total) by mouth daily. 30 tablet 0  . cholecalciferol (VITAMIN D) 1000 units tablet Take 1 tablet (1,000 Units total) by mouth daily. 30 tablet 0  . clopidogrel (PLAVIX) 75 MG tablet Take 1 tablet (75 mg total) by mouth daily. 90 tablet 3  . [facility] GAS RELIEF EXTRA STRENGTH 125 MG chewable tablet Chew 125 mg by  mouth as needed.    Marland Kitchen escitalopram (LEXAPRO) 20 MG tablet Take 1 tablet (20 mg total) by mouth daily. 90 tablet 1  . esomeprazole (NEXIUM) 40 MG capsule Take 1 capsule (40 mg total) by mouth as needed (for heartburn). 30 capsule 5  . furosemide (LASIX) 40 MG tablet Take 0.5 tablets (20 mg total) by mouth 3 (three) times a week. 30 tablet 6  . NEOMYCIN-POLYMYXIN-HYDROCORTISONE (CORTISPORIN) 1 % SOLN OTIC solution Place 3 drops into both ears every 8 (eight) hours. 10 mL 0  . olopatadine (PATANOL) 0.1 % ophthalmic solution INSTILL 1 DROP INTO BOTH EYS DAILY AS NEEDED FOR ALLERGIES 5 mL 2  . Omega-3 Fatty Acids (FISH OIL) 1000 MG CAPS TAKE 1 CAPSULE (1,000 MG TOTAL) BY MOUTH EVERY MORNING. 120 capsule 0  . ondansetron (ZOFRAN-ODT) 4 MG disintegrating tablet Take 2 tablets (8 mg total) by mouth as needed. 45 tablet 0  . rosuvastatin (CRESTOR) 10 MG tablet Take 1 tablet (10 mg total) by mouth daily. 90 tablet 1  . sacubitril-valsartan (ENTRESTO) 24-26 MG Take 1 tablet by mouth 2 (two) times daily. 180 tablet 3  . traZODone (DESYREL) 50 MG tablet Take 0.5-1 tablets (25-50 mg total) by mouth at bedtime as needed for sleep. 30 tablet 3  . triamcinolone cream (KENALOG) 0.1 % Apply 1 application topically daily.     No current facility-administered medications on file prior to visit.    Allergies  Allergen Reactions  . Amoxicillin Hives  . Bactrim [Sulfamethoxazole-Trimethoprim] Other (See Comments)    Dried out his eyes  . Cephalexin Itching  . Hydrocodone Itching  . Lipitor [Atorvastatin] Other (See Comments)    Damage to calf muscles  . Oxycodone Itching  . Statins Other (See Comments)    Muscle pain   . Vytorin [Ezetimibe-Simvastatin] Other (See Comments)    Damage to calf muscles    Assessment/Plan:  1. Hyperlipidemia - LDL above goal <55 mg/dL given history of progressive heart disease, CVA, and T2DM. Medication adherence appears optimal with rosuvastatin 10 mg daily. Will increase  rosuvastatin to 20 mg daily. Encouraged patient to aim for a diet full of vegetables, fruit and lean meats (chicken, [place], fish) and to limit carbs (bread, pasta, sugar, rice) and red meat consumption. Encouraged patient to exercise 20-30 minutes daily with the goal of 150 minutes per week. Patient verbalized understanding. Scheduled follow-up lipid panel and LFTs in 3 months.  Lorel Monaco, PharmD, [place] PGY2 Ambulatory Care Resident [place]

## 2021-01-08 ENCOUNTER — Telehealth: Payer: Self-pay | Admitting: Pharmacist

## 2021-01-08 ENCOUNTER — Ambulatory Visit (INDEPENDENT_AMBULATORY_CARE_PROVIDER_SITE_OTHER): Payer: Medicare Other | Admitting: Pharmacist

## 2021-01-08 ENCOUNTER — Other Ambulatory Visit: Payer: Self-pay | Admitting: Cardiovascular Disease

## 2021-01-08 ENCOUNTER — Other Ambulatory Visit: Payer: Self-pay

## 2021-01-08 ENCOUNTER — Other Ambulatory Visit: Payer: Self-pay | Admitting: *Deleted

## 2021-01-08 DIAGNOSIS — E785 Hyperlipidemia, unspecified: Secondary | ICD-10-CM | POA: Diagnosis not present

## 2021-01-08 MED ORDER — CARVEDILOL 3.125 MG PO TABS: 3.1250 mg | ORAL_TABLET | Freq: Every day | ORAL | 0 refills | Status: DC

## 2021-01-08 MED ORDER — ROSUVASTATIN CALCIUM 20 MG PO TABS: 20.0000 mg | ORAL_TABLET | Freq: Every day | ORAL | 3 refills | Status: DC

## 2021-01-08 NOTE — Telephone Encounter (Signed)
error 

## 2021-01-08 NOTE — Patient Instructions (Addendum)
Nice to see you today!  Keep up the good work with diet and exercise. Aim for a diet full of vegetables, fruit and lean meats (chicken, [place], fish). Try to limit carbs (bread, pasta, sugar, rice) and red meat consumption.  Your goal LDL is <55 mg/dL, you're currently at 87 mg/dL  Medication Changes: Begin taking Rosuvastatin 20 mg daily  Please give [place] a call at [phone] with any questions or concerns.

## 2021-02-01 ENCOUNTER — Other Ambulatory Visit: Payer: Self-pay | Admitting: Cardiovascular Disease

## 2021-02-04 DIAGNOSIS — H2513 Age-related nuclear cataract, bilateral: Secondary | ICD-10-CM | POA: Diagnosis not present

## 2021-02-04 DIAGNOSIS — H353110 Nonexudative age-related macular degeneration, right eye, stage unspecified: Secondary | ICD-10-CM | POA: Diagnosis not present

## 2021-02-04 DIAGNOSIS — H353220 Exudative age-related macular degeneration, left eye, stage unspecified: Secondary | ICD-10-CM | POA: Diagnosis not present

## 2021-02-04 DIAGNOSIS — H53462 Homonymous bilateral field defects, left side: Secondary | ICD-10-CM | POA: Diagnosis not present

## 2021-02-19 ENCOUNTER — Encounter: Payer: Self-pay | Admitting: Registered Nurse

## 2021-02-19 ENCOUNTER — Ambulatory Visit (INDEPENDENT_AMBULATORY_CARE_PROVIDER_SITE_OTHER): Payer: Medicare Other | Admitting: Registered Nurse

## 2021-02-19 ENCOUNTER — Other Ambulatory Visit: Payer: Self-pay

## 2021-02-19 VITALS — BP 140/79 | HR 78 | Temp 98.4°F | Resp 17 | Ht 66.0 in | Wt 174.8 lb

## 2021-02-19 DIAGNOSIS — K2100 Gastro-esophageal reflux disease with esophagitis, without bleeding: Secondary | ICD-10-CM | POA: Diagnosis not present

## 2021-02-19 DIAGNOSIS — R21 Rash and other nonspecific skin eruption: Secondary | ICD-10-CM | POA: Diagnosis not present

## 2021-02-19 DIAGNOSIS — K047 Periapical abscess without sinus: Secondary | ICD-10-CM

## 2021-02-19 MED ORDER — DOXYCYCLINE HYCLATE 100 MG PO TABS: 100.0000 mg | ORAL_TABLET | Freq: Two times a day (BID) | ORAL | 0 refills | Status: DC

## 2021-02-19 MED ORDER — ESOMEPRAZOLE MAGNESIUM 40 MG PO CPDR: 40.0000 mg | DELAYED_RELEASE_CAPSULE | ORAL | 5 refills | Status: DC | PRN

## 2021-02-19 MED ORDER — TRIAMCINOLONE ACETONIDE 0.1 % EX CREA: 1.0000 "application " | TOPICAL_CREAM | Freq: Every day | CUTANEOUS | 0 refills | Status: DC

## 2021-02-19 NOTE — Patient Instructions (Signed)
° ° ° °  If you have lab work done today you will be contacted with your lab results within the next 2 weeks.  If you have not heard from us then please contact us. The fastest way to get your results is to register for My Chart. ° ° °IF you received an x-ray today, you will receive an invoice from [facility]. Please contact [facility] at [phone] with questions or concerns regarding your invoice.  ° °IF you received labwork today, you will receive an invoice from [facility]. Please contact [facility] at [phone] with questions or concerns regarding your invoice.  ° °Our billing staff will not be able to assist you with questions regarding bills from these companies. ° °You will be contacted with the lab results as soon as they are available. The fastest way to get your results is to activate your My Chart account. Instructions are located on the last page of this paperwork. If you have not heard from us regarding the results in 2 weeks, please contact this office. °  ° ° ° °

## 2021-03-29 ENCOUNTER — Other Ambulatory Visit: Payer: Self-pay

## 2021-03-29 ENCOUNTER — Encounter: Payer: Self-pay | Admitting: Registered Nurse

## 2021-03-29 ENCOUNTER — Ambulatory Visit (INDEPENDENT_AMBULATORY_CARE_PROVIDER_SITE_OTHER): Payer: Medicare Other | Admitting: Registered Nurse

## 2021-03-29 VITALS — BP 134/87 | HR 79 | Temp 97.8°F | Resp 18 | Ht 66.0 in | Wt 173.8 lb

## 2021-03-29 DIAGNOSIS — Z23 Encounter for immunization: Secondary | ICD-10-CM | POA: Diagnosis not present

## 2021-03-29 DIAGNOSIS — J302 Other seasonal allergic rhinitis: Secondary | ICD-10-CM

## 2021-03-29 MED ORDER — MONTELUKAST SODIUM 10 MG PO TABS: 10.0000 mg | ORAL_TABLET | Freq: Every day | ORAL | 3 refills | Status: DC

## 2021-03-29 MED ORDER — HYDROXYZINE HCL 10 MG PO TABS: 10.0000 mg | ORAL_TABLET | Freq: Every evening | ORAL | 0 refills | Status: DC | PRN

## 2021-03-29 NOTE — Patient Instructions (Addendum)
[name] to see you  Hydroxyzine- taking 10mg  before bed can help with allergies. This may make you a little sleepy.  Montelukast (Singulair) - taking 10mg  before bed can help with allergies. This may cause bad dreams.  These can work well together or apart.  Let me know if things get worse or don't get better.  Sorry - totally forgot to send the morphine today - maybe another time  Thanks, stay well!  [name]    If you have lab work done today you will be contacted with your lab results within the next 2 weeks.  If you have not heard from [place] then please contact us. The fastest way to get your results is to register for My Chart.   IF you received an x-ray today, you will receive an invoice from [facility]. Please contact [facility] at [phone] with questions or concerns regarding your invoice.   IF you received labwork today, you will receive an invoice from [facility]. Please contact [facility] at [phone] with questions or concerns regarding your invoice.   Our billing staff will not be able to assist you with questions regarding bills from these companies.  You will be contacted with the lab results as soon as they are available. The fastest way to get your results is to activate your My Chart account. Instructions are located on the last page of this paperwork. If you have not heard from [place] regarding the results in 2 weeks, please contact this office.

## 2021-03-29 NOTE — Progress Notes (Signed)
Patient received shingles vaccine in Lt deltoid.

## 2021-04-01 ENCOUNTER — Other Ambulatory Visit: Payer: Self-pay | Admitting: Registered Nurse

## 2021-04-01 DIAGNOSIS — R110 Nausea: Secondary | ICD-10-CM

## 2021-04-01 NOTE — Progress Notes (Signed)
Established Patient Office Visit  Subjective:  Patient ID: Savion Washam, male    DOB: 05-24-1941  Age: 80 y.o. MRN: [id number]  CC:  Chief Complaint  Patient presents with   Allergies    Patient states he is having some allergy problems and coughing up phlegm that's clear for a couple days.    HPI Atticus Wedin presents for allergies  Worse lately than usual. Still using daily antihistamine and nasal spray Now coughing a bit at night. Clear phlegm. Thin No shob, doe, chest pain, headache, or other changes.  No other concerns at this time.  Past Medical History:  Diagnosis Date   Allergic rhinitis due to pollen 6/97/9480   Chronic systolic heart failure ([place]) - EF improved to normal on Rx    Echo 11/18: Mild LVH, EF 20, diffuse HK mild AI, a sending aorta 42 mm (mildly dilated), mild MR, mild TR, trivial PI, PASP 44 // Echo 3/19: mild LVH, EF 25-30, diff HK, Gr 1 DD, asc aorta 43 mm (mildly dilated), MAC, mild reduced RVSF // Limited echo 10/19: Mild concentric LVH, EF 16-55, grade 1 diastolic dysfunction, MAC, mild LAE    Coronary artery disease involving native coronary artery of native heart without angina pectoris 11/11/2017   R/L HC 11/18: pLAD 45, oD1 65 ; elevated PCWP (mean 29), moderate pulmonary hypertension, CO 3.66, CI 2.07   Depression with anxiety 07/08/2017   Essential hypertension 07/08/2017   GERD with esophagitis 01/11/2018   Hernia of abdominal cavity    Hyperlipidemia    Hyperlipidemia LDL goal <130 05/25/2017   The 10-year ASCVD risk score Mikey Bussing DC Jr., et al., 2013) is: 31.1%   Values used to calculate the score:     Age: 13 years     Sex: Male     Is Non-Hispanic African American: No     Diabetic: No     Tobacco smoker: No     Systolic Blood Pressure: 374 mmHg     Is BP treated: No     HDL Cholesterol: 68.7 mg/dL     Total Cholesterol: 258 mg/dL    Idiopathic inflammatory myopathy 05/25/2017   Neuropathy involving both lower extremities 05/25/2017   Nonischemic  cardiomyopathy ([place]) 11/11/2017   LHC 09/16/17-nonobstructive CAD   Oropharyngeal dysphagia 01/11/2018   Type 2 diabetes mellitus with complication, without long-term current use of insulin ([place]) 07/08/2017    Past Surgical History:  Procedure Laterality Date   BACK SURGERY     HERNIA REPAIR     RIGHT/LEFT HEART CATH AND CORONARY ANGIOGRAPHY N/A 09/16/2017   Procedure: RIGHT/LEFT HEART CATH AND CORONARY ANGIOGRAPHY;  Surgeon: Martinique, Peter M, MD;  Location: [facility];  Service: Cardiovascular;  Laterality: N/A;   SPINE SURGERY      Family History  Problem Relation Age of Onset   Stroke Mother    Kidney disease Father    Stroke Father    Hypertension Father    Early death Father    Cancer Neg Hx    Alcohol abuse Neg Hx    Diabetes Neg Hx    Heart disease Neg Hx    Hyperlipidemia Neg Hx     Social History   Socioeconomic History   Marital status: Single    Spouse name: Not on file   Number of children: Not on file   Years of education: Not on file   Highest education level: Not on file  Occupational History   Not on file  Tobacco Use   Smoking status: Never   Smokeless tobacco: Never  Vaping Use   Vaping Use: Never used  Substance and Sexual Activity   Alcohol use: No   Drug use: No   Sexual activity: Not Currently  Other Topics Concern   Not on file  Social History Narrative   Not on file   Social Determinants of Health   Financial Resource Strain: Not on file  Food Insecurity: Not on file  Transportation Needs: Not on file  Physical Activity: Not on file  Stress: Not on file  Social Connections: Not on file  Intimate Partner Violence: Not on file    Outpatient Medications Prior to Visit  Medication Sig Dispense Refill   acetaminophen (TYLENOL) 325 MG tablet Take 2 tablets (650 mg total) by mouth every 4 (four) hours as needed for mild pain (or temp > 37.5 C (99.5 F)).     carvedilol (COREG) 3.125 MG tablet TAKE 1 TABLET BY MOUTH DAILY 90 tablet  3   cholecalciferol (VITAMIN D) 1000 units tablet Take 1 tablet (1,000 Units total) by mouth daily. 30 tablet 0   clopidogrel (PLAVIX) 75 MG tablet Take 1 tablet (75 mg total) by mouth daily. 90 tablet 3   [facility] GAS RELIEF EXTRA STRENGTH 125 MG chewable tablet Chew 125 mg by mouth as needed.     doxycycline (VIBRA-TABS) 100 MG tablet Take 1 tablet (100 mg total) by mouth 2 (two) times daily. 20 tablet 0   escitalopram (LEXAPRO) 20 MG tablet Take 1 tablet (20 mg total) by mouth daily. 90 tablet 1   esomeprazole (NEXIUM) 40 MG capsule Take 1 capsule (40 mg total) by mouth as needed (for heartburn). 30 capsule 5   furosemide (LASIX) 40 MG tablet Take 0.5 tablets (20 mg total) by mouth 3 (three) times a week. 30 tablet 6   NEOMYCIN-POLYMYXIN-HYDROCORTISONE (CORTISPORIN) 1 % SOLN OTIC solution Place 3 drops into both ears every 8 (eight) hours. 10 mL 0   olopatadine (PATANOL) 0.1 % ophthalmic solution INSTILL 1 DROP INTO BOTH EYS DAILY AS NEEDED FOR ALLERGIES 5 mL 2   Omega-3 Fatty Acids (FISH OIL) 1000 MG CAPS TAKE 1 CAPSULE (1,000 MG TOTAL) BY MOUTH EVERY MORNING. 120 capsule 0   rosuvastatin (CRESTOR) 20 MG tablet Take 1 tablet (20 mg total) by mouth daily. 90 tablet 3   sacubitril-valsartan (ENTRESTO) 24-26 MG Take 1 tablet by mouth 2 (two) times daily. 180 tablet 3   traZODone (DESYREL) 50 MG tablet Take 0.5-1 tablets (25-50 mg total) by mouth at bedtime as needed for sleep. 30 tablet 3   triamcinolone cream (KENALOG) 0.1 % Apply 1 application topically daily. 30 g 0   ondansetron (ZOFRAN-ODT) 4 MG disintegrating tablet Take 2 tablets (8 mg total) by mouth as needed. 45 tablet 0   No facility-administered medications prior to visit.    Allergies  Allergen Reactions   Amoxicillin Hives   Bactrim [Sulfamethoxazole-Trimethoprim] Other (See Comments)    Dried out his eyes   Cephalexin Itching   Hydrocodone Itching   Lipitor [Atorvastatin] Other (See Comments)    Damage to calf muscles    Oxycodone Itching   Statins Other (See Comments)    Muscle pain    Vytorin [Ezetimibe-Simvastatin] Other (See Comments)    Damage to calf muscles    ROS Review of Systems Per hpi, otherwise negative   Objective:    Physical Exam Constitutional:      General: He is not in acute distress.  Appearance: Normal appearance. He is normal weight. He is not ill-appearing, toxic-appearing or diaphoretic.  Cardiovascular:     Rate and Rhythm: Normal rate and regular rhythm.     Heart sounds: Normal heart sounds. No murmur heard.   No friction rub. No gallop.  Pulmonary:     Effort: Pulmonary effort is normal. No respiratory distress.     Breath sounds: Normal breath sounds. No stridor. No wheezing, rhonchi or rales.  Chest:     Chest wall: No tenderness.  Neurological:     General: No focal deficit present.     Mental Status: He is alert and oriented to person, place, and time. Mental status is at baseline.  Psychiatric:        Mood and Affect: Mood normal.        Behavior: Behavior normal.        Thought Content: Thought content normal.        Judgment: Judgment normal.    BP 134/87   Pulse 79   Temp 97.8 F (36.6 C) (Temporal)   Resp 18   Ht _0  (1.676 m)   Wt 173 lb 12.8 oz (78.8 kg)   SpO2 96%   BMI 28.05 kg/m  Wt Readings from Last 3 Encounters:  03/29/21 173 lb 12.8 oz (78.8 kg)  02/19/21 174 lb 12.8 oz (79.3 kg)  12/24/20 166 lb 3.2 oz (75.4 kg)     There are no preventive care reminders to display for this patient.  There are no preventive care reminders to display for this patient.  Lab Results  Component Value Date   TSH 0.797 05/14/2020   Lab Results  Component Value Date   WBC 6.6 05/14/2020   HGB 14.3 05/14/2020   HCT 42.2 05/14/2020   MCV 89 05/14/2020   PLT 216 03/02/2020   Lab Results  Component Value Date   NA 140 12/24/2020   K 4.6 12/24/2020   CO2 22 12/24/2020   GLUCOSE 106 (H) 12/24/2020   BUN 18 12/24/2020   CREATININE 0.90  12/24/2020   BILITOT 0.8 12/24/2020   ALKPHOS 50 12/24/2020   AST 18 12/24/2020   ALT 22 12/24/2020   PROT 6.3 12/24/2020   ALBUMIN 4.2 12/24/2020   CALCIUM 8.8 12/24/2020   ANIONGAP 12 03/06/2020   EGFR 86 12/24/2020   GFR 83.56 03/29/2018   Lab Results  Component Value Date   CHOL 163 12/24/2020   Lab Results  Component Value Date   HDL 51 12/24/2020   Lab Results  Component Value Date   LDLCALC 87 12/24/2020   Lab Results  Component Value Date   TRIG 142 12/24/2020   Lab Results  Component Value Date   CHOLHDL 3.2 12/24/2020   Lab Results  Component Value Date   HGBA1C 6.2 (H) 05/14/2020      Assessment & Plan:   Problem List Items Addressed This Visit   None Visit Diagnoses     Seasonal allergies    -  Primary   Relevant Medications   hydrOXYzine (ATARAX/VISTARIL) 10 MG tablet   montelukast (SINGULAIR) 10 MG tablet   Need for shingles vaccine       Relevant Orders   Varicella-zoster vaccine IM (Shingrix) (Completed)       Meds ordered this encounter  Medications   hydrOXYzine (ATARAX/VISTARIL) 10 MG tablet    Sig: Take 1 tablet (10 mg total) by mouth at bedtime as needed.    Dispense:  30 tablet  Refill:  0    Order Specific Question:   Supervising Provider    Answer:   Carlota Raspberry, JEFFREY R [[id number]]   montelukast (SINGULAIR) 10 MG tablet    Sig: Take 1 tablet (10 mg total) by mouth at bedtime.    Dispense:  30 tablet    Refill:  3    Order Specific Question:   Supervising Provider    Answer:   Carlota Raspberry, JEFFREY R [[id number]]    Follow-up: No follow-ups on file.   PLAN Can try either hydroxyzine or montelukast. Discussed r/b/se of each of these with pt who voices understanding Return if worsening or failing to improve Consider referral to allergist if no relief Patient encouraged to call clinic with any questions, comments, or concerns.  Maximiano Coss, NP

## 2021-04-08 ENCOUNTER — Other Ambulatory Visit: Payer: Self-pay

## 2021-04-08 ENCOUNTER — Other Ambulatory Visit: Payer: Medicare Other

## 2021-04-08 DIAGNOSIS — E785 Hyperlipidemia, unspecified: Secondary | ICD-10-CM

## 2021-04-09 ENCOUNTER — Telehealth: Payer: Self-pay | Admitting: Pharmacist

## 2021-04-09 DIAGNOSIS — E785 Hyperlipidemia, unspecified: Secondary | ICD-10-CM

## 2021-04-09 LAB — LIPID PANEL
Chol/HDL Ratio: 3.5 ratio (ref 0.0–5.0)
Cholesterol, Total: 154 mg/dL (ref 100–199)
HDL: 44 mg/dL (ref >39)
LDL Chol Calc (NIH): 87 mg/dL (ref 0–99)
Triglycerides: 130 mg/dL (ref 0–149)
VLDL Cholesterol Cal: 23 mg/dL (ref 5–40)

## 2021-04-09 LAB — HEPATIC FUNCTION PANEL
ALT: 12 IU/L (ref 0–44)
AST: 12 IU/L (ref 0–40)
Albumin: 4.3 g/dL (ref 3.7–4.7)
Alkaline Phosphatase: 58 IU/L (ref 44–121)
Bilirubin Total: 0.6 mg/dL (ref 0.0–1.2)
Bilirubin, Direct: 0.2 mg/dL (ref 0.00–0.40)
Total Protein: 6.6 g/dL (ref 6.0–8.5)

## 2021-04-09 MED ORDER — ROSUVASTATIN CALCIUM 40 MG PO TABS
40.0000 mg | ORAL_TABLET | Freq: Every day | ORAL | 3 refills | Status: DC
Start: 2021-04-09 — End: 2021-04-12

## 2021-04-09 MED ORDER — EZETIMIBE 10 MG PO TABS: 10.0000 mg | ORAL_TABLET | Freq: Every day | ORAL | 3 refills | Status: DC

## 2021-04-09 NOTE — Telephone Encounter (Signed)
Patient and wife returned call regarding lipid panel results. LFTs wnl. LDL 87 not at goal <55 mg/dL given history of progressive heart disease, CVA, and T2DM. Reports medication adherence with rosuvastatin 20 mg daily. Denies myalgias. Discussed additional lipid-lowering management options and pt agreeable to increase rosuvastatin to 40 mg daily and start ezetimibe 10 mg daily. Prescriptions sent to pharmacy. Follow-up lipid panel and LFTs scheduled in 3 months.   Additional, pt verbalized permission for medical information to be given to his wife regarding his care.

## 2021-04-09 NOTE — Telephone Encounter (Signed)
Contacted patient regarding lipid panel results, however pt was not available. Requested call back to discuss lab work.

## 2021-04-09 NOTE — Addendum Note (Signed)
Addended by: Lorel Monaco A on: 04/09/2021 10:11 AM   Modules accepted: Orders

## 2021-04-09 NOTE — Telephone Encounter (Signed)
duplicate

## 2021-04-12 ENCOUNTER — Telehealth: Payer: Self-pay | Admitting: Cardiovascular Disease

## 2021-04-12 ENCOUNTER — Telehealth: Payer: Self-pay | Admitting: Pharmacist

## 2021-04-12 DIAGNOSIS — E785 Hyperlipidemia, unspecified: Secondary | ICD-10-CM

## 2021-04-12 MED ORDER — ROSUVASTATIN CALCIUM 20 MG PO TABS
20.0000 mg | ORAL_TABLET | Freq: Every day | ORAL | 3 refills | Status: DC
Start: 2021-04-12 — End: 2022-10-29

## 2021-04-12 NOTE — Telephone Encounter (Signed)
Called patient back about his message. Patient stated he has BLE pain due to Crestor higher dose 40 mg.  Megan, Pharm D. Recently reduced his Crestor to 20 mg due to this. Patient wants to know if he can be prescribed something for pain. Informed patient that we usually only prescribe pain medication for chest pain (nitro, imdur, etc). Informed patient that pain in his BLE should go away with decreasing the dose of Crestor. Encouraged patient to call his PCP if his BLE pain continues. Patient verbalized understanding.

## 2021-04-12 NOTE — Telephone Encounter (Signed)
Pt's wife called clinic. Reports pt took 1 dose of rosuvastatin 40mg  daily and had pain in his legs. Will decrease back to 20mg  daily which he tolerated well. He had not picked up ezetimibe yet but plans to pick this up today and start taking. Will keep lab appt scheduled in Sept.

## 2021-04-12 NOTE — Telephone Encounter (Signed)
PT is calling with concerns about the pain in his legs.

## 2021-04-15 ENCOUNTER — Ambulatory Visit (INDEPENDENT_AMBULATORY_CARE_PROVIDER_SITE_OTHER): Payer: Medicare Other | Admitting: Registered Nurse

## 2021-04-15 ENCOUNTER — Encounter: Payer: Self-pay | Admitting: Registered Nurse

## 2021-04-15 ENCOUNTER — Other Ambulatory Visit: Payer: Self-pay

## 2021-04-15 VITALS — BP 167/68 | HR 70 | Temp 98.2°F | Resp 18 | Ht 66.0 in | Wt 175.2 lb

## 2021-04-15 DIAGNOSIS — M79604 Pain in right leg: Secondary | ICD-10-CM | POA: Diagnosis not present

## 2021-04-15 DIAGNOSIS — Z789 Other specified health status: Secondary | ICD-10-CM

## 2021-04-15 DIAGNOSIS — M79605 Pain in left leg: Secondary | ICD-10-CM | POA: Diagnosis not present

## 2021-04-15 DIAGNOSIS — T466X5A Adverse effect of antihyperlipidemic and antiarteriosclerotic drugs, initial encounter: Secondary | ICD-10-CM

## 2021-04-15 LAB — HEPATIC FUNCTION PANEL
ALT: 17 U/L (ref 0–53)
AST: 12 U/L (ref 0–37)
Albumin: 4.3 g/dL (ref 3.5–5.2)
Alkaline Phosphatase: 41 U/L (ref 39–117)
Bilirubin, Direct: 0.1 mg/dL (ref 0.0–0.3)
Total Bilirubin: 0.6 mg/dL (ref 0.2–1.2)
Total Protein: 6.7 g/dL (ref 6.0–8.3)

## 2021-04-15 LAB — CK: Total CK: 54 U/L (ref 7–232)

## 2021-04-15 MED ORDER — TRAMADOL HCL 50 MG PO TABS: 50.0000 mg | ORAL_TABLET | Freq: Three times a day (TID) | ORAL | 0 refills | Status: AC | PRN

## 2021-04-15 NOTE — Telephone Encounter (Signed)
There is a 2nd phone note about this - pt tolerated rosuvastatin 20mg  daily just fine, only took 1 dose of 40mg  of rosuvastatin and reported leg pain. Pain should resolve with lower statin dose but pt aware to call clinic if it does not.

## 2021-04-15 NOTE — Patient Instructions (Signed)
Mr Gornick -   Unfortunately the pharmacy is out of demerol, morphine, and pretty much anything except tramadol. PCA pumps are on backorder but you can expect one in the next 4-5 business years.   Labs today will be back tomorrow - I'll call with any concerns.  Tramadol for breakthrough pain, taper prednisone, cut statin in half or try every other day dosing.  If balance issues persist please let me know  Thank you  Rich

## 2021-04-15 NOTE — Progress Notes (Signed)
Established Patient Office Visit  Subjective:  Patient ID: Christian [name], male    DOB: 08/14/1941  Age: 79 y.o. MRN: [id number]  CC:  Chief Complaint  Patient presents with   Leg Pain    Patient is having some left pain for about a week.     HPI Christian [name] presents for left leg pain  Onset around 1 week ago Steady, not worsening or improving Muscular more than joints No swelling, some mild tenderness Recently started rosuvastatin per cardiology Has had statin intolerance in past, but has not been on rosuvastatin  Notes bilateral leg pain. Has been taking leftover prednisone with good effect Did have some balance deficit due to weakness, one fall, no injury  Past Medical History:  Diagnosis Date   Allergic rhinitis due to pollen 2/53/6644   Chronic systolic heart failure ([place]) - EF improved to normal on Rx    Echo 11/18: Mild LVH, EF 20, diffuse HK mild AI, a sending aorta 42 mm (mildly dilated), mild MR, mild TR, trivial PI, PASP 44 // Echo 3/19: mild LVH, EF 25-30, diff HK, Gr 1 DD, asc aorta 43 mm (mildly dilated), MAC, mild reduced RVSF // Limited echo 10/19: Mild concentric LVH, EF 03-47, grade 1 diastolic dysfunction, MAC, mild LAE    Coronary artery disease involving native coronary artery of native heart without angina pectoris 11/11/2017   R/L HC 11/18: pLAD 45, oD1 65 ; elevated PCWP (mean 29), moderate pulmonary hypertension, CO 3.66, CI 2.07   Depression with anxiety 07/08/2017   Essential hypertension 07/08/2017   GERD with esophagitis 01/11/2018   Hernia of abdominal cavity    Hyperlipidemia    Hyperlipidemia LDL goal <130 05/25/2017   The 10-year ASCVD risk score Mikey Bussing DC Jr., et al., 2013) is: 31.1%   Values used to calculate the score:     Age: 36 years     Sex: Male     Is Non-Hispanic African American: No     Diabetic: No     Tobacco smoker: No     Systolic Blood Pressure: 425 mmHg     Is BP treated: No     HDL Cholesterol: 68.7 mg/dL     Total Cholesterol: 258  mg/dL    Idiopathic inflammatory myopathy 05/25/2017   Neuropathy involving both lower extremities 05/25/2017   Nonischemic cardiomyopathy ([place]) 11/11/2017   LHC 09/16/17-nonobstructive CAD   Oropharyngeal dysphagia 01/11/2018   Type 2 diabetes mellitus with complication, without long-term current use of insulin ([place]) 07/08/2017    Past Surgical History:  Procedure Laterality Date   BACK SURGERY     HERNIA REPAIR     RIGHT/LEFT HEART CATH AND CORONARY ANGIOGRAPHY N/A 09/16/2017   Procedure: RIGHT/LEFT HEART CATH AND CORONARY ANGIOGRAPHY;  Surgeon: Martinique, Peter M, MD;  Location: [facility];  Service: Cardiovascular;  Laterality: N/A;   SPINE SURGERY      Family History  Problem Relation Age of Onset   Stroke Mother    Kidney disease Father    Stroke Father    Hypertension Father    Early death Father    Cancer Neg Hx    Alcohol abuse Neg Hx    Diabetes Neg Hx    Heart disease Neg Hx    Hyperlipidemia Neg Hx     Social History   Socioeconomic History   Marital status: Single    Spouse name: Not on file   Number of children: Not on file   Years of  education: Not on file   Highest education level: Not on file  Occupational History   Not on file  Tobacco Use   Smoking status: Never   Smokeless tobacco: Never  Vaping Use   Vaping Use: Never used  Substance and Sexual Activity   Alcohol use: No   Drug use: No   Sexual activity: Not Currently  Other Topics Concern   Not on file  Social History Narrative   Not on file   Social Determinants of Health   Financial Resource Strain: Not on file  Food Insecurity: Not on file  Transportation Needs: Not on file  Physical Activity: Not on file  Stress: Not on file  Social Connections: Not on file  Intimate Partner Violence: Not on file    Outpatient Medications Prior to Visit  Medication Sig Dispense Refill   acetaminophen (TYLENOL) 325 MG tablet Take 2 tablets (650 mg total) by mouth every 4 (four) hours as  needed for mild pain (or temp > 37.5 C (99.5 F)).     carvedilol (COREG) 3.125 MG tablet TAKE 1 TABLET BY MOUTH DAILY 90 tablet 3   cholecalciferol (VITAMIN D) 1000 units tablet Take 1 tablet (1,000 Units total) by mouth daily. 30 tablet 0   clopidogrel (PLAVIX) 75 MG tablet Take 1 tablet (75 mg total) by mouth daily. 90 tablet 3   [employer] GAS RELIEF EXTRA STRENGTH 125 MG chewable tablet Chew 125 mg by mouth as needed.     doxycycline (VIBRA-TABS) 100 MG tablet Take 1 tablet (100 mg total) by mouth 2 (two) times daily. 20 tablet 0   escitalopram (LEXAPRO) 20 MG tablet Take 1 tablet (20 mg total) by mouth daily. 90 tablet 1   esomeprazole (NEXIUM) 40 MG capsule Take 1 capsule (40 mg total) by mouth as needed (for heartburn). 30 capsule 5   ezetimibe (ZETIA) 10 MG tablet Take 1 tablet (10 mg total) by mouth daily. 90 tablet 3   furosemide (LASIX) 40 MG tablet Take 0.5 tablets (20 mg total) by mouth 3 (three) times a week. 30 tablet 6   hydrOXYzine (ATARAX/VISTARIL) 10 MG tablet Take 1 tablet (10 mg total) by mouth at bedtime as needed. 30 tablet 0   montelukast (SINGULAIR) 10 MG tablet Take 1 tablet (10 mg total) by mouth at bedtime. 30 tablet 3   NEOMYCIN-POLYMYXIN-HYDROCORTISONE (CORTISPORIN) 1 % SOLN OTIC solution Place 3 drops into both ears every 8 (eight) hours. 10 mL 0   olopatadine (PATANOL) 0.1 % ophthalmic solution INSTILL 1 DROP INTO BOTH EYS DAILY AS NEEDED FOR ALLERGIES 5 mL 2   Omega-3 Fatty Acids (FISH OIL) 1000 MG CAPS TAKE 1 CAPSULE (1,000 MG TOTAL) BY MOUTH EVERY MORNING. 120 capsule 0   ondansetron (ZOFRAN-ODT) 4 MG disintegrating tablet TAKE 2 TABLETS BY MOUTH AS NEEDED 45 tablet 0   rosuvastatin (CRESTOR) 20 MG tablet Take 1 tablet (20 mg total) by mouth daily. 90 tablet 3   sacubitril-valsartan (ENTRESTO) 24-26 MG Take 1 tablet by mouth 2 (two) times daily. 180 tablet 3   traZODone (DESYREL) 50 MG tablet Take 0.5-1 tablets (25-50 mg total) by mouth at bedtime as needed for sleep. 30  tablet 3   triamcinolone cream (KENALOG) 0.1 % Apply 1 application topically daily. 30 g 0   No facility-administered medications prior to visit.    Allergies  Allergen Reactions   Amoxicillin Hives   Bactrim [Sulfamethoxazole-Trimethoprim] Other (See Comments)    Dried out his eyes   Cephalexin Itching  Hydrocodone Itching   Lipitor [Atorvastatin] Other (See Comments)    Damage to calf muscles   Oxycodone Itching   Statins Other (See Comments)    Muscle pain    Vytorin [Ezetimibe-Simvastatin] Other (See Comments)    Damage to calf muscles    ROS Review of Systems Per hpi     Objective:    Physical Exam Constitutional:      General: He is not in acute distress.    Appearance: Normal appearance. He is normal weight. He is not ill-appearing, toxic-appearing or diaphoretic.  Cardiovascular:     Rate and Rhythm: Normal rate and regular rhythm.     Heart sounds: Normal heart sounds. No murmur heard.   No friction rub. No gallop.  Pulmonary:     Effort: Pulmonary effort is normal. No respiratory distress.     Breath sounds: Normal breath sounds. No stridor. No wheezing, rhonchi or rales.  Chest:     Chest wall: No tenderness.  Musculoskeletal:        General: No swelling, tenderness, deformity or signs of injury. Normal range of motion.     Right lower leg: No edema.     Left lower leg: No edema.  Skin:    General: Skin is warm and dry.  Neurological:     General: No focal deficit present.     Mental Status: He is alert and oriented to person, place, and time. Mental status is at baseline.  Psychiatric:        Mood and Affect: Mood normal.        Behavior: Behavior normal.        Thought Content: Thought content normal.        Judgment: Judgment normal.    BP (!) 167/68   Pulse 70   Temp 98.2 F (36.8 C) (Temporal)   Resp 18   Ht _0  (1.676 m)   Wt 175 lb 3.2 oz (79.5 kg)   SpO2 99%   BMI 28.28 kg/m  Wt Readings from Last 3 Encounters:  04/15/21 175  lb 3.2 oz (79.5 kg)  03/29/21 173 lb 12.8 oz (78.8 kg)  02/19/21 174 lb 12.8 oz (79.3 kg)     Health Maintenance Due  Topic Date Due   COVID-19 Vaccine (3 - Booster for Pfizer series) 06/24/2020   HEMOGLOBIN A1C  11/14/2020    There are no preventive care reminders to display for this patient.  Lab Results  Component Value Date   TSH 0.797 05/14/2020   Lab Results  Component Value Date   WBC 6.6 05/14/2020   HGB 14.3 05/14/2020   HCT 42.2 05/14/2020   MCV 89 05/14/2020   PLT 216 03/02/2020   Lab Results  Component Value Date   NA 140 12/24/2020   K 4.6 12/24/2020   CO2 22 12/24/2020   GLUCOSE 106 (H) 12/24/2020   BUN 18 12/24/2020   CREATININE 0.90 12/24/2020   BILITOT 0.6 04/08/2021   ALKPHOS 58 04/08/2021   AST 12 04/08/2021   ALT 12 04/08/2021   PROT 6.6 04/08/2021   ALBUMIN 4.3 04/08/2021   CALCIUM 8.8 12/24/2020   ANIONGAP 12 03/06/2020   EGFR 86 12/24/2020   GFR 83.56 03/29/2018   Lab Results  Component Value Date   CHOL 154 04/08/2021   Lab Results  Component Value Date   HDL 44 04/08/2021   Lab Results  Component Value Date   LDLCALC 87 04/08/2021   Lab Results  Component Value Date  TRIG 130 04/08/2021   Lab Results  Component Value Date   CHOLHDL 3.5 04/08/2021   Lab Results  Component Value Date   HGBA1C 6.2 (H) 05/14/2020      Assessment & Plan:   Problem List Items Addressed This Visit   None Visit Diagnoses     Statin intolerance    -  Primary   Relevant Orders   Hepatic function panel   CK   Pain in both lower extremities       Relevant Medications   traMADol (ULTRAM) 50 MG tablet       No orders of the defined types were placed in this encounter.   Follow-up: No follow-ups on file.   PLAN Tramadol 25-61m PO tid prn for pain Reduce statin dosing to every other day or half dosing Taper off of prednisone - 20-10-10-5-5 mg Avoid NSAID use given hx of cva Ck and lfts drawn today Patient encouraged to call  clinic with any questions, comments, or concerns.  RMaximiano Coss NP

## 2021-06-03 IMAGING — MR MR HEAD WO/W CM
14 of 16 series · 40 of 48 positions shown · IV contrast (gadavist)
Comparison: 12/16/2014

CLINICAL DATA: Dizziness and confusion

EXAM:
MRI HEAD WITHOUT AND WITH CONTRAST
TECHNIQUE: Multiplanar, multiecho pulse sequences of the brain and surrounding
structures were obtained without and with intravenous contrast.
CONTRAST:  7.5mL GADAVIST GADOBUTROL 1 MMOL/ML IV SOLN

[Series 5: DWI · axial · 3.0mm · 0.88mm/px · z∈[-92,+48]mm · 5 of 96 slices shown (1 of 4)]
[im 1/96]
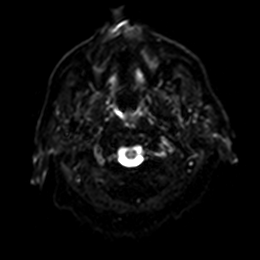
[im 24/96]
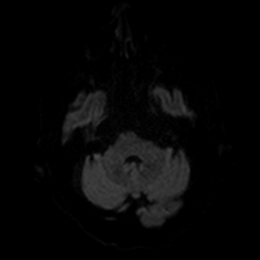
[im 48/96]
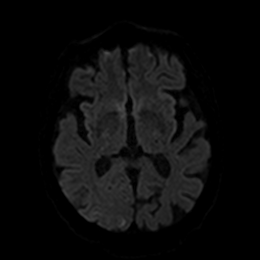
[im 72/96]
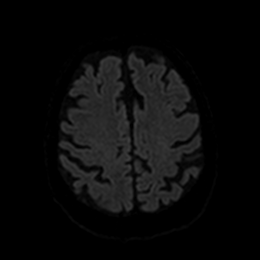
[im 96/96]
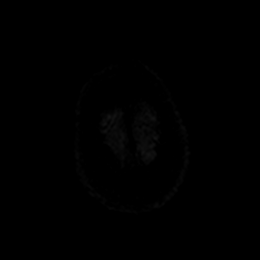

[Series 6: DWI · axial · 3.0mm · 0.88mm/px · z∈[-92,+48]mm · 2 of 48 slices shown (2 of 4)]
[im 1/48]
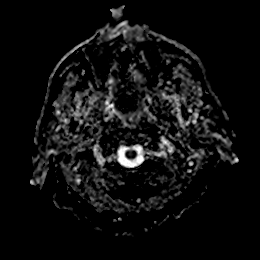
[im 48/48]
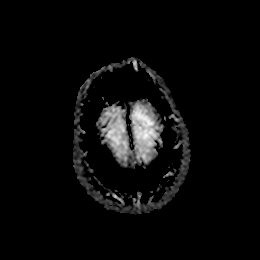

[Series 7: T1 · sagittal · 5.0mm · 0.75mm/px · 2 of 27 slices shown]
[im 1/27]
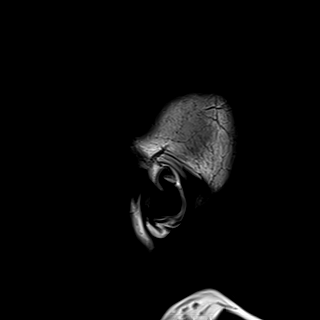
[im 27/27]
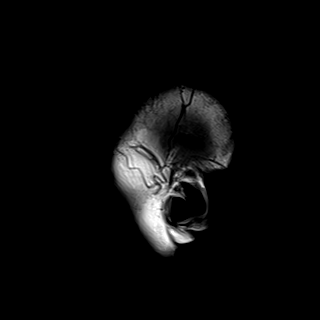

[Series 8: DWI · coronal · 4.0mm · 0.88mm/px · 5 of 70 slices shown (3 of 4)]
[im 1/70]
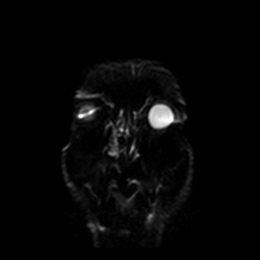
[im 18/70]
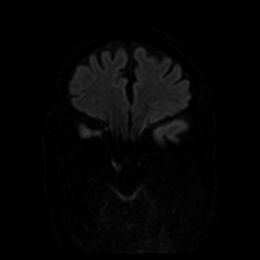
[im 35/70]
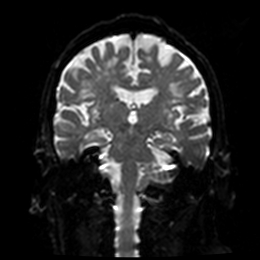
[im 52/70]
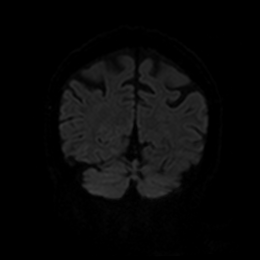
[im 70/70]
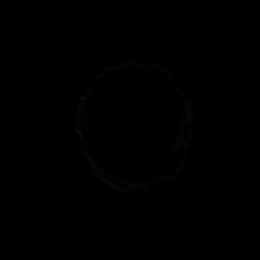

[Series 9: DWI · coronal · 4.0mm · 0.88mm/px · 2 of 35 slices shown (4 of 4)]
[im 1/35]
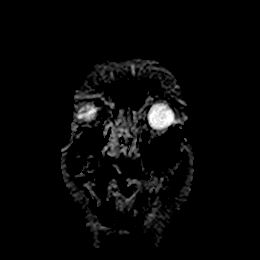
[im 35/35]
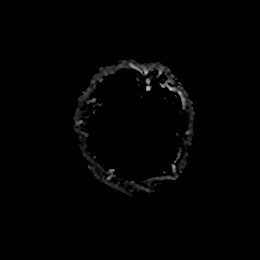

[Series 10: T2 · axial · 5.0mm · 0.72mm/px · z∈[-83,+72]mm · 2 of 27 slices shown]
[im 1/27]
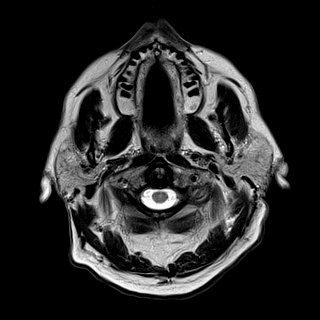
[im 27/27]
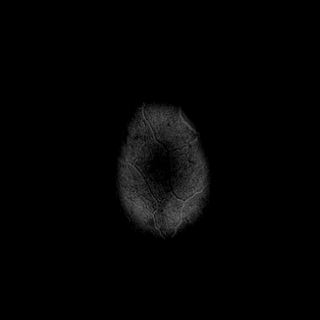

[Series 12: mag_images · axial · 3.0mm · 0.90mm/px · z∈[-101,+63]mm · 4 of 56 slices shown]
[im 1/56]
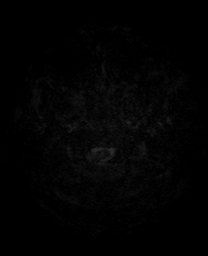
[im 19/56]
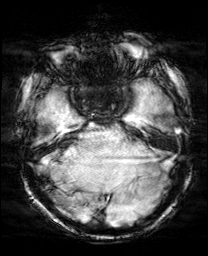
[im 37/56]
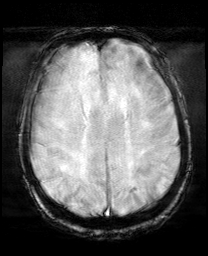
[im 56/56]
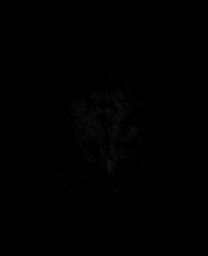

[Series 13: pha_images · axial · 3.0mm · 0.90mm/px · z∈[-98,+63]mm · 3 of 54 slices shown]
[im 1/54]
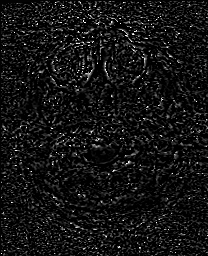
[im 27/54]
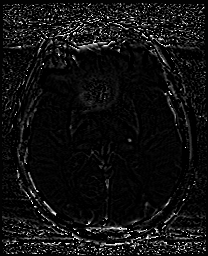
[im 54/54]
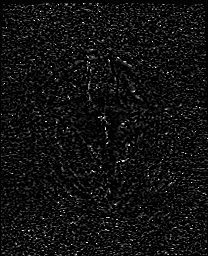

[Series 14: swi_images · axial · 3.0mm · 0.90mm/px · z∈[-101,+63]mm · 4 of 56 slices shown]
[im 1/56]
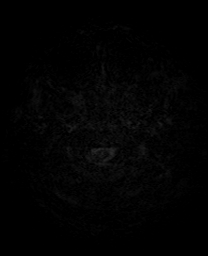
[im 19/56]
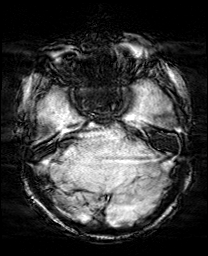
[im 37/56]
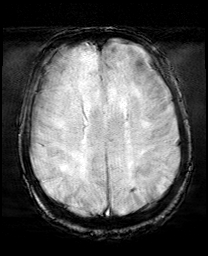
[im 56/56]
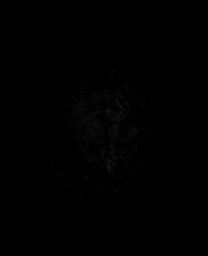

[Series 15: mip_images(sw) · axial · 24.0mm · 0.90mm/px · z∈[-90,+53]mm · 3 of 49 slices shown]
[im 1/49]
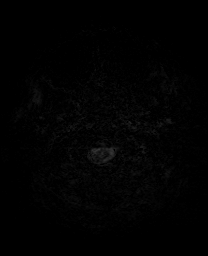
[im 25/49]
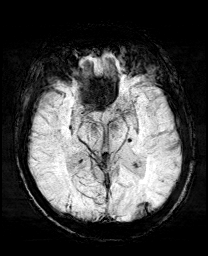
[im 49/49]
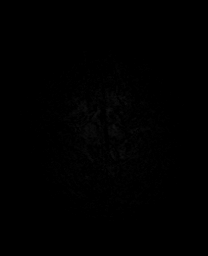

[Series 17: FLAIR · axial · 5.0mm · 0.90mm/px · z∈[-83,+72]mm · 2 of 27 slices shown]
[im 1/27]
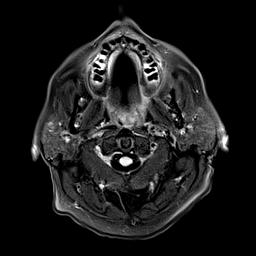
[im 27/27]
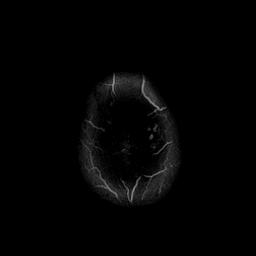

[Series 26: T2 post-contrast · coronal · 5.0mm · 0.72mm/px · 2 of 30 slices shown]
[im 1/30]
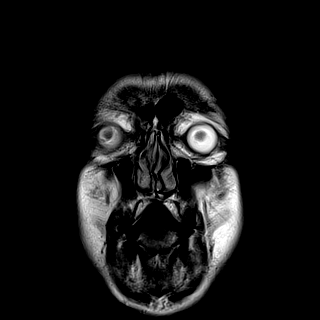
[im 30/30]
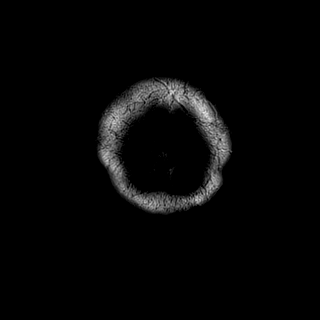

[Series 28: T1 post-contrast · coronal · 5.0mm · 0.34mm/px · 2 of 30 slices shown (1 of 2)]
[im 1/30]
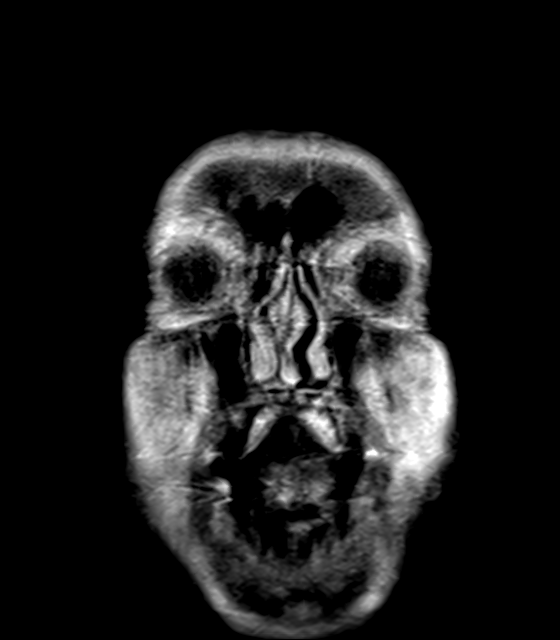
[im 30/30]
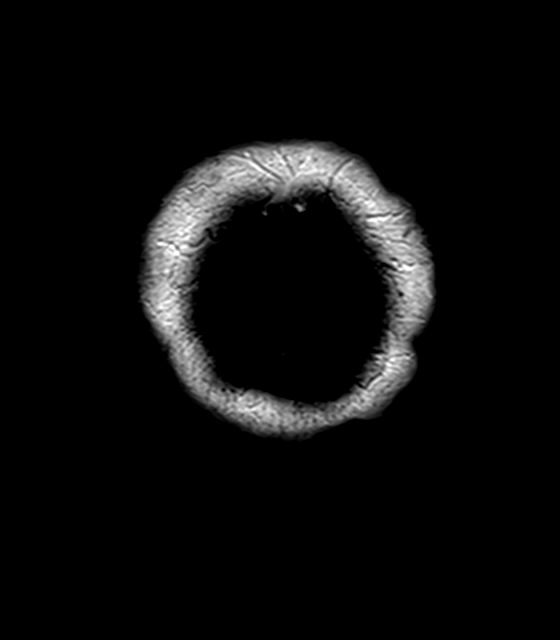

[Series 29: T1 post-contrast · sagittal · 5.0mm · 0.72mm/px · 2 of 27 slices shown (2 of 2)]
[im 1/27]
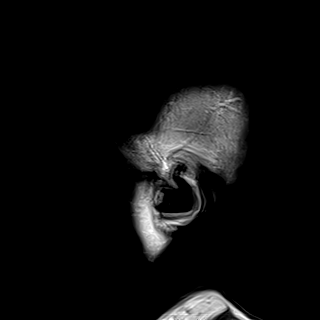
[im 27/27]
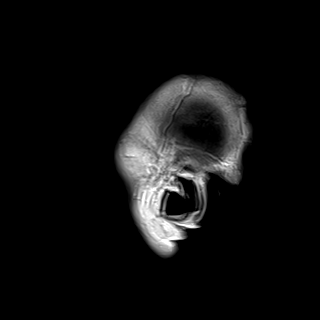

[40 of 48 positions shown; findings below may reference images not displayed]

FINDINGS: Brain: Patchy restricted diffusion in the right occipital and medial
temporal cortex, including along the hippocampus. Minimal stippled
restricted diffusion in the right thalamus. No acute hemorrhage,
hydrocephalus, or masslike finding.

Chronic blood products along the bilateral occipital cortex. Two
remote white matter insults the left cerebral hemisphere and a
remote left thalamic microhemorrhage.

Mild remote cortical infarct along the left occipital convexity.
Moderate chronic small vessel ischemia in the white matter.

Vascular: Absent flow in the non dominant right vertebral artery by
T2 weighted imaging.

Skull and upper cervical spine: Negative for marrow lesion.

Sinuses/Orbits: Negative
IMPRESSION: 1. Patchy acute infarct in the right PCA territory.
2. Remote left occipital cortex infarct and remote bilateral
superficial occipital hemorrhage.
3. Abnormal or absent flow in the non dominant right vertebral
artery

## 2021-07-08 ENCOUNTER — Other Ambulatory Visit: Payer: Medicare Other

## 2021-07-10 ENCOUNTER — Other Ambulatory Visit: Payer: Self-pay | Admitting: Cardiovascular Disease

## 2021-07-18 ENCOUNTER — Other Ambulatory Visit: Payer: Medicare Other

## 2021-07-23 ENCOUNTER — Other Ambulatory Visit: Payer: Medicare Other | Admitting: *Deleted

## 2021-07-23 ENCOUNTER — Other Ambulatory Visit: Payer: Self-pay

## 2021-07-23 DIAGNOSIS — E785 Hyperlipidemia, unspecified: Secondary | ICD-10-CM

## 2021-07-23 LAB — HEPATIC FUNCTION PANEL
ALT: 23 IU/L (ref 0–44)
AST: 18 IU/L (ref 0–40)
Albumin: 4.7 g/dL — ABNORMAL HIGH (ref 3.6–4.6)
Alkaline Phosphatase: 52 IU/L (ref 44–121)
Bilirubin Total: 1.2 mg/dL (ref 0.0–1.2)
Bilirubin, Direct: 0.31 mg/dL (ref 0.00–0.40)
Total Protein: 6.5 g/dL (ref 6.0–8.5)

## 2021-07-23 LAB — LIPID PANEL
Chol/HDL Ratio: 3.5 ratio (ref 0.0–5.0)
Cholesterol, Total: 146 mg/dL (ref 100–199)
HDL: 42 mg/dL (ref >39)
LDL Chol Calc (NIH): 77 mg/dL (ref 0–99)
Triglycerides: 154 mg/dL — ABNORMAL HIGH (ref 0–149)
VLDL Cholesterol Cal: 27 mg/dL (ref 5–40)

## 2021-08-11 NOTE — Progress Notes (Signed)
Established Patient Office Visit  Subjective:  Patient ID: Christian [name], male    DOB: 04-Jun-1941  Age: 80 y.o. MRN: [id number]  CC:  Chief Complaint  Patient presents with   Follow-up    Patient is here for a for a follow up , but his main concerns is a bad tooth on his left side and also to discuss scabs that keeps coming on his back.    HPI Christian [name] presents for follow up   Continues to do well with escitalopram. Wants to continue. No AE.   Needs refill on esomeprazole. Good effect. No AE. Uses prn - most days  Notes acute concern of tooth pain. Ongoing around 1 week. Worsening. Frequent dental infection in past. Cannot get in to see his dentist soon enough. Interested in abx.  Concern for skin lesions. On back. Flat. Scab occasionally. No pain or vesicles. No ulcerations. No ABCDE concerns in regards to melanoma. His biggest concern is that they itch.   Past Medical History:  Diagnosis Date   Allergic rhinitis due to pollen 5/63/8756   Chronic systolic heart failure ([place]) - EF improved to normal on Rx    Echo 11/18: Mild LVH, EF 20, diffuse HK mild AI, a sending aorta 42 mm (mildly dilated), mild MR, mild TR, trivial PI, PASP 44 // Echo 3/19: mild LVH, EF 25-30, diff HK, Gr 1 DD, asc aorta 43 mm (mildly dilated), MAC, mild reduced RVSF // Limited echo 10/19: Mild concentric LVH, EF 43-32, grade 1 diastolic dysfunction, MAC, mild LAE    Coronary artery disease involving native coronary artery of native heart without angina pectoris 11/11/2017   R/L HC 11/18: pLAD 45, oD1 65 ; elevated PCWP (mean 29), moderate pulmonary hypertension, CO 3.66, CI 2.07   Depression with anxiety 07/08/2017   Essential hypertension 07/08/2017   GERD with esophagitis 01/11/2018   Hernia of abdominal cavity    Hyperlipidemia    Hyperlipidemia LDL goal <130 05/25/2017   The 10-year ASCVD risk score Mikey Bussing DC Jr., et al., 2013) is: 31.1%   Values used to calculate the score:     Age: 73 years     Sex:  Male     Is Non-Hispanic African American: No     Diabetic: No     Tobacco smoker: No     Systolic Blood Pressure: 951 mmHg     Is BP treated: No     HDL Cholesterol: 68.7 mg/dL     Total Cholesterol: 258 mg/dL    Idiopathic inflammatory myopathy 05/25/2017   Neuropathy involving both lower extremities 05/25/2017   Nonischemic cardiomyopathy ([place]) 11/11/2017   LHC 09/16/17-nonobstructive CAD   Oropharyngeal dysphagia 01/11/2018   Type 2 diabetes mellitus with complication, without long-term current use of insulin ([place]) 07/08/2017    Past Surgical History:  Procedure Laterality Date   BACK SURGERY     HERNIA REPAIR     RIGHT/LEFT HEART CATH AND CORONARY ANGIOGRAPHY N/A 09/16/2017   Procedure: RIGHT/LEFT HEART CATH AND CORONARY ANGIOGRAPHY;  Surgeon: Martinique, Peter M, MD;  Location: [facility];  Service: Cardiovascular;  Laterality: N/A;   SPINE SURGERY      Family History  Problem Relation Age of Onset   Stroke Mother    Kidney disease Father    Stroke Father    Hypertension Father    Early death Father    Cancer Neg Hx    Alcohol abuse Neg Hx    Diabetes Neg Hx  Heart disease Neg Hx    Hyperlipidemia Neg Hx     Social History   Socioeconomic History   Marital status: Single    Spouse name: Not on file   Number of children: Not on file   Years of education: Not on file   Highest education level: Not on file  Occupational History   Not on file  Tobacco Use   Smoking status: Never   Smokeless tobacco: Never  Vaping Use   Vaping Use: Never used  Substance and Sexual Activity   Alcohol use: No   Drug use: No   Sexual activity: Not Currently  Other Topics Concern   Not on file  Social History Narrative   Not on file   Social Determinants of Health   Financial Resource Strain: Not on file  Food Insecurity: Not on file  Transportation Needs: Not on file  Physical Activity: Not on file  Stress: Not on file  Social Connections: Not on file  Intimate Partner  Violence: Not on file    Outpatient Medications Prior to Visit  Medication Sig Dispense Refill   acetaminophen (TYLENOL) 325 MG tablet Take 2 tablets (650 mg total) by mouth every 4 (four) hours as needed for mild pain (or temp > 37.5 C (99.5 F)).     carvedilol (COREG) 3.125 MG tablet TAKE 1 TABLET BY MOUTH DAILY 90 tablet 3   cholecalciferol (VITAMIN D) 1000 units tablet Take 1 tablet (1,000 Units total) by mouth daily. 30 tablet 0   [employer] GAS RELIEF EXTRA STRENGTH 125 MG chewable tablet Chew 125 mg by mouth as needed.     escitalopram (LEXAPRO) 20 MG tablet Take 1 tablet (20 mg total) by mouth daily. 90 tablet 1   furosemide (LASIX) 40 MG tablet Take 0.5 tablets (20 mg total) by mouth 3 (three) times a week. 30 tablet 6   NEOMYCIN-POLYMYXIN-HYDROCORTISONE (CORTISPORIN) 1 % SOLN OTIC solution Place 3 drops into both ears every 8 (eight) hours. 10 mL 0   olopatadine (PATANOL) 0.1 % ophthalmic solution INSTILL 1 DROP INTO BOTH EYS DAILY AS NEEDED FOR ALLERGIES 5 mL 2   Omega-3 Fatty Acids (FISH OIL) 1000 MG CAPS TAKE 1 CAPSULE (1,000 MG TOTAL) BY MOUTH EVERY MORNING. 120 capsule 0   sacubitril-valsartan (ENTRESTO) 24-26 MG Take 1 tablet by mouth 2 (two) times daily. 180 tablet 3   traZODone (DESYREL) 50 MG tablet Take 0.5-1 tablets (25-50 mg total) by mouth at bedtime as needed for sleep. 30 tablet 3   clopidogrel (PLAVIX) 75 MG tablet Take 1 tablet (75 mg total) by mouth daily. 90 tablet 3   esomeprazole (NEXIUM) 40 MG capsule Take 1 capsule (40 mg total) by mouth as needed (for heartburn). 30 capsule 5   ondansetron (ZOFRAN-ODT) 4 MG disintegrating tablet Take 2 tablets (8 mg total) by mouth as needed. 45 tablet 0   rosuvastatin (CRESTOR) 20 MG tablet Take 1 tablet (20 mg total) by mouth daily. 90 tablet 3   triamcinolone cream (KENALOG) 0.1 % Apply 1 application topically daily.     No facility-administered medications prior to visit.    Allergies  Allergen Reactions   Amoxicillin Hives    Bactrim [Sulfamethoxazole-Trimethoprim] Other (See Comments)    Dried out his eyes   Cephalexin Itching   Hydrocodone Itching   Lipitor [Atorvastatin] Other (See Comments)    Damage to calf muscles   Oxycodone Itching   Statins Other (See Comments)    Muscle pain    Vytorin [  Ezetimibe-Simvastatin] Other (See Comments)    Damage to calf muscles    ROS Review of Systems Per hpi    Objective:    Physical Exam Constitutional:      General: He is not in acute distress.    Appearance: Normal appearance. He is normal weight. He is not ill-appearing, toxic-appearing or diaphoretic.  Cardiovascular:     Rate and Rhythm: Normal rate and regular rhythm.     Heart sounds: Normal heart sounds. No murmur heard.   No friction rub. No gallop.  Pulmonary:     Effort: Pulmonary effort is normal. No respiratory distress.     Breath sounds: Normal breath sounds. No stridor. No wheezing, rhonchi or rales.  Chest:     Chest wall: No tenderness.  Musculoskeletal:     Cervical back: Normal range of motion and neck supple. No rigidity.  Lymphadenopathy:     Cervical: Cervical adenopathy (submandibular and tonsillar) present.  Skin:    General: Skin is warm and dry.     Coloration: Skin is not jaundiced or pale.     Findings: Lesion (benign appearing nevi on back. some actinic keratoses. no suspicious lesions.) present. No bruising, erythema or rash.  Neurological:     General: No focal deficit present.     Mental Status: He is alert and oriented to person, place, and time. Mental status is at baseline.  Psychiatric:        Mood and Affect: Mood normal.        Behavior: Behavior normal.        Thought Content: Thought content normal.        Judgment: Judgment normal.    BP 140/79   Pulse 78   Temp 98.4 F (36.9 C) (Temporal)   Resp 17   Ht _0  (1.676 m)   Wt 174 lb 12.8 oz (79.3 kg)   SpO2 99%   BMI 28.21 kg/m  Wt Readings from Last 3 Encounters:  04/15/21 175 lb 3.2 oz (79.5  kg)  03/29/21 173 lb 12.8 oz (78.8 kg)  02/19/21 174 lb 12.8 oz (79.3 kg)     Health Maintenance Due  Topic Date Due   COVID-19 Vaccine (3 - Booster for Pfizer series) 03/19/2020   HEMOGLOBIN A1C  11/14/2020   FOOT EXAM  05/02/2021   INFLUENZA VACCINE  05/20/2021   Zoster Vaccines- Shingrix (2 of 2) 05/24/2021   OPHTHALMOLOGY EXAM  05/30/2021    There are no preventive care reminders to display for this patient.  Lab Results  Component Value Date   TSH 0.797 05/14/2020   Lab Results  Component Value Date   WBC 6.6 05/14/2020   HGB 14.3 05/14/2020   HCT 42.2 05/14/2020   MCV 89 05/14/2020   PLT 216 03/02/2020   Lab Results  Component Value Date   NA 140 12/24/2020   K 4.6 12/24/2020   CO2 22 12/24/2020   GLUCOSE 106 (H) 12/24/2020   BUN 18 12/24/2020   CREATININE 0.90 12/24/2020   BILITOT 1.2 07/23/2021   ALKPHOS 52 07/23/2021   AST 18 07/23/2021   ALT 23 07/23/2021   PROT 6.5 07/23/2021   ALBUMIN 4.7 (H) 07/23/2021   CALCIUM 8.8 12/24/2020   ANIONGAP 12 03/06/2020   EGFR 86 12/24/2020   GFR 83.56 03/29/2018   Lab Results  Component Value Date   CHOL 146 07/23/2021   Lab Results  Component Value Date   HDL 42 07/23/2021   Lab Results  Component Value  Date   LDLCALC 77 07/23/2021   Lab Results  Component Value Date   TRIG 154 (H) 07/23/2021   Lab Results  Component Value Date   CHOLHDL 3.5 07/23/2021   Lab Results  Component Value Date   HGBA1C 6.2 (H) 05/14/2020      Assessment & Plan:   Problem List Items Addressed This Visit       Digestive   GERD with esophagitis - Primary   Relevant Medications   esomeprazole (NEXIUM) 40 MG capsule   Other Visit Diagnoses     Dental infection       Relevant Medications   doxycycline (VIBRA-TABS) 100 MG tablet   Rash and nonspecific skin eruption       Relevant Medications   triamcinolone cream (KENALOG) 0.1 %       Meds ordered this encounter  Medications   esomeprazole (NEXIUM) 40  MG capsule    Sig: Take 1 capsule (40 mg total) by mouth as needed (for heartburn).    Dispense:  30 capsule    Refill:  5   doxycycline (VIBRA-TABS) 100 MG tablet    Sig: Take 1 tablet (100 mg total) by mouth 2 (two) times daily.    Dispense:  20 tablet    Refill:  0    Order Specific Question:   Supervising Provider    Answer:   Carlota Raspberry, JEFFREY R [[id number]]   triamcinolone cream (KENALOG) 0.1 %    Sig: Apply 1 application topically daily.    Dispense:  30 g    Refill:  0    Order Specific Question:   Supervising Provider    Answer:   Carlota Raspberry, JEFFREY R [[id number]]    Follow-up: No follow-ups on file.   PLAN Refill esomeprazole as above Doxycycline for dental infection. See dentist when available. Triamcinalone for itching on back. Can consider dermatology if worsening or failing to improve. Patient encouraged to call clinic with any questions, comments, or concerns.  Maximiano Coss, NP

## 2021-08-16 ENCOUNTER — Encounter: Payer: Self-pay | Admitting: Registered Nurse

## 2021-08-16 ENCOUNTER — Ambulatory Visit (INDEPENDENT_AMBULATORY_CARE_PROVIDER_SITE_OTHER): Payer: Medicare Other | Admitting: Registered Nurse

## 2021-08-16 ENCOUNTER — Other Ambulatory Visit: Payer: Self-pay

## 2021-08-16 VITALS — BP 120/68 | HR 100 | Temp 98.2°F | Resp 18 | Ht 66.0 in | Wt 181.4 lb

## 2021-08-16 DIAGNOSIS — R21 Rash and other nonspecific skin eruption: Secondary | ICD-10-CM

## 2021-08-16 DIAGNOSIS — J302 Other seasonal allergic rhinitis: Secondary | ICD-10-CM

## 2021-08-16 DIAGNOSIS — F411 Generalized anxiety disorder: Secondary | ICD-10-CM | POA: Diagnosis not present

## 2021-08-16 DIAGNOSIS — K2100 Gastro-esophageal reflux disease with esophagitis, without bleeding: Secondary | ICD-10-CM

## 2021-08-16 DIAGNOSIS — E785 Hyperlipidemia, unspecified: Secondary | ICD-10-CM

## 2021-08-16 DIAGNOSIS — Z23 Encounter for immunization: Secondary | ICD-10-CM | POA: Diagnosis not present

## 2021-08-16 MED ORDER — CLOTRIMAZOLE-BETAMETHASONE 1-0.05 % EX CREA: 1.0000 "application " | TOPICAL_CREAM | Freq: Every day | CUTANEOUS | 0 refills | Status: DC

## 2021-08-16 MED ORDER — ESCITALOPRAM OXALATE 20 MG PO TABS: 20.0000 mg | ORAL_TABLET | Freq: Every day | ORAL | 1 refills | Status: DC

## 2021-08-16 MED ORDER — MONTELUKAST SODIUM 10 MG PO TABS: 10.0000 mg | ORAL_TABLET | Freq: Every day | ORAL | 3 refills | Status: DC

## 2021-08-16 MED ORDER — HYDROXYZINE HCL 10 MG PO TABS: 10.0000 mg | ORAL_TABLET | Freq: Every evening | ORAL | 0 refills | Status: DC | PRN

## 2021-08-16 MED ORDER — ESOMEPRAZOLE MAGNESIUM 40 MG PO CPDR: 40.0000 mg | DELAYED_RELEASE_CAPSULE | ORAL | 5 refills | Status: DC | PRN

## 2021-08-16 NOTE — Patient Instructions (Addendum)
Dr. Linna Hoff -   Honored to be in the presence of a medical legend -  Keep the sense of humor, it suits you well.  I'll be in touch with any concerns, reach out if you have any of your own.  Thank you,  Rich     If you have lab work done today you will be contacted with your lab results within the next 2 weeks.  If you have not heard from [place] then please contact us. The fastest way to get your results is to register for My Chart.   IF you received an x-ray today, you will receive an invoice from [facility]. Please contact [facility] at [phone] with questions or concerns regarding your invoice.   IF you received labwork today, you will receive an invoice from [facility]. Please contact [facility] at [phone] with questions or concerns regarding your invoice.   Our billing staff will not be able to assist you with questions regarding bills from these companies.  You will be contacted with the lab results as soon as they are available. The fastest way to get your results is to activate your My Chart account. Instructions are located on the last page of this paperwork. If you have not heard from [place] regarding the results in 2 weeks, please contact this office.

## 2021-08-16 NOTE — Progress Notes (Signed)
Established Patient Office Visit  Subjective:  Patient ID: Christian [name], male    DOB: 1940-11-15  Age: 80 y.o. MRN: [id number]  CC:  Chief Complaint  Patient presents with   Follow-up    Patient is here for 6 month follow up. Patient states he would like you to look at his arm    HPI Christian [name] presents for 6 mo follow up   Ongoing lesions on arm - weak skin skin breakdown. Heals well. No sign of infection.  Mental health stable - occ passive SI, but would never act on it. He is appalled at a question of HI stating that he would never hurt anyone else.   Otherwise notes ongoing rash on head. In the past he had had a vet friend who had given him a topical gentamicin-betamethasone-clotrimazole combo which worked well. However, this is expired and he would like a refill.  Past Medical History:  Diagnosis Date   Allergic rhinitis due to pollen 2/99/2426   Chronic systolic heart failure ([place]) - EF improved to normal on Rx    Echo 11/18: Mild LVH, EF 20, diffuse HK mild AI, a sending aorta 42 mm (mildly dilated), mild MR, mild TR, trivial PI, PASP 44 // Echo 3/19: mild LVH, EF 25-30, diff HK, Gr 1 DD, asc aorta 43 mm (mildly dilated), MAC, mild reduced RVSF // Limited echo 10/19: Mild concentric LVH, EF 83-41, grade 1 diastolic dysfunction, MAC, mild LAE    Coronary artery disease involving native coronary artery of native heart without angina pectoris 11/11/2017   R/L HC 11/18: pLAD 45, oD1 65 ; elevated PCWP (mean 29), moderate pulmonary hypertension, CO 3.66, CI 2.07   Depression with anxiety 07/08/2017   Essential hypertension 07/08/2017   GERD with esophagitis 01/11/2018   Hernia of abdominal cavity    Hyperlipidemia    Hyperlipidemia LDL goal <130 05/25/2017   The 10-year ASCVD risk score Mikey Bussing DC Jr., et al., 2013) is: 31.1%   Values used to calculate the score:     Age: 77 years     Sex: Male     Is Non-Hispanic African American: No     Diabetic: No     Tobacco smoker: No      Systolic Blood Pressure: 962 mmHg     Is BP treated: No     HDL Cholesterol: 68.7 mg/dL     Total Cholesterol: 258 mg/dL    Idiopathic inflammatory myopathy 05/25/2017   Neuropathy involving both lower extremities 05/25/2017   Nonischemic cardiomyopathy ([place]) 11/11/2017   LHC 09/16/17-nonobstructive CAD   Oropharyngeal dysphagia 01/11/2018   Type 2 diabetes mellitus with complication, without long-term current use of insulin ([place]) 07/08/2017    Past Surgical History:  Procedure Laterality Date   BACK SURGERY     HERNIA REPAIR     RIGHT/LEFT HEART CATH AND CORONARY ANGIOGRAPHY N/A 09/16/2017   Procedure: RIGHT/LEFT HEART CATH AND CORONARY ANGIOGRAPHY;  Surgeon: Martinique, Peter M, MD;  Location: [place] [facility];  Service: Cardiovascular;  Laterality: N/A;   SPINE SURGERY      Family History  Problem Relation Age of Onset   Stroke Mother    Kidney disease Father    Stroke Father    Hypertension Father    Early death Father    Cancer Neg Hx    Alcohol abuse Neg Hx    Diabetes Neg Hx    Heart disease Neg Hx    Hyperlipidemia Neg Hx  Social History   Socioeconomic History   Marital status: Single    Spouse name: Not on file   Number of children: Not on file   Years of education: Not on file   Highest education level: Not on file  Occupational History   Not on file  Tobacco Use   Smoking status: Never   Smokeless tobacco: Never  Vaping Use   Vaping Use: Never used  Substance and Sexual Activity   Alcohol use: No   Drug use: No   Sexual activity: Not Currently  Other Topics Concern   Not on file  Social History Narrative   Not on file   Social Determinants of Health   Financial Resource Strain: Not on file  Food Insecurity: Not on file  Transportation Needs: Not on file  Physical Activity: Not on file  Stress: Not on file  Social Connections: Not on file  Intimate Partner Violence: Not on file    Outpatient Medications Prior to Visit  Medication Sig Dispense  Refill   acetaminophen (TYLENOL) 325 MG tablet Take 2 tablets (650 mg total) by mouth every 4 (four) hours as needed for mild pain (or temp > 37.5 C (99.5 F)).     carvedilol (COREG) 3.125 MG tablet TAKE 1 TABLET BY MOUTH DAILY 90 tablet 3   cholecalciferol (VITAMIN D) 1000 units tablet Take 1 tablet (1,000 Units total) by mouth daily. 30 tablet 0   clopidogrel (PLAVIX) 75 MG tablet TAKE 1 TABLET BY MOUTH DAILY 90 tablet 1   [facility] GAS RELIEF EXTRA STRENGTH 125 MG chewable tablet Chew 125 mg by mouth as needed.     doxycycline (VIBRA-TABS) 100 MG tablet Take 1 tablet (100 mg total) by mouth 2 (two) times daily. 20 tablet 0   furosemide (LASIX) 40 MG tablet Take 0.5 tablets (20 mg total) by mouth 3 (three) times a week. 30 tablet 6   NEOMYCIN-POLYMYXIN-HYDROCORTISONE (CORTISPORIN) 1 % SOLN OTIC solution Place 3 drops into both ears every 8 (eight) hours. 10 mL 0   olopatadine (PATANOL) 0.1 % ophthalmic solution INSTILL 1 DROP INTO BOTH EYS DAILY AS NEEDED FOR ALLERGIES 5 mL 2   Omega-3 Fatty Acids (FISH OIL) 1000 MG CAPS TAKE 1 CAPSULE (1,000 MG TOTAL) BY MOUTH EVERY MORNING. 120 capsule 0   ondansetron (ZOFRAN-ODT) 4 MG disintegrating tablet TAKE 2 TABLETS BY MOUTH AS NEEDED 45 tablet 0   sacubitril-valsartan (ENTRESTO) 24-26 MG Take 1 tablet by mouth 2 (two) times daily. 180 tablet 3   traZODone (DESYREL) 50 MG tablet Take 0.5-1 tablets (25-50 mg total) by mouth at bedtime as needed for sleep. 30 tablet 3   triamcinolone cream (KENALOG) 0.1 % Apply 1 application topically daily. 30 g 0   escitalopram (LEXAPRO) 20 MG tablet Take 1 tablet (20 mg total) by mouth daily. 90 tablet 1   esomeprazole (NEXIUM) 40 MG capsule Take 1 capsule (40 mg total) by mouth as needed (for heartburn). 30 capsule 5   hydrOXYzine (ATARAX/VISTARIL) 10 MG tablet Take 1 tablet (10 mg total) by mouth at bedtime as needed. 30 tablet 0   montelukast (SINGULAIR) 10 MG tablet Take 1 tablet (10 mg total) by mouth at bedtime. 30  tablet 3   ezetimibe (ZETIA) 10 MG tablet Take 1 tablet (10 mg total) by mouth daily. 90 tablet 3   rosuvastatin (CRESTOR) 20 MG tablet Take 1 tablet (20 mg total) by mouth daily. 90 tablet 3   No facility-administered medications prior to visit.  Allergies  Allergen Reactions   Amoxicillin Hives   Bactrim [Sulfamethoxazole-Trimethoprim] Other (See Comments)    Dried out his eyes   Cephalexin Itching   Hydrocodone Itching   Lipitor [Atorvastatin] Other (See Comments)    Damage to calf muscles   Oxycodone Itching   Statins Other (See Comments)    Muscle pain    Vytorin [Ezetimibe-Simvastatin] Other (See Comments)    Damage to calf muscles    ROS Review of Systems  Constitutional: Negative.   HENT: Negative.    Eyes: Negative.   Respiratory: Negative.    Cardiovascular: Negative.   Gastrointestinal: Negative.   Genitourinary: Negative.   Musculoskeletal: Negative.   Skin: Negative.   Neurological: Negative.   Psychiatric/Behavioral: Negative.    All other systems reviewed and are negative.    Objective:    Physical Exam Constitutional:      General: He is not in acute distress.    Appearance: Normal appearance. He is normal weight. He is not ill-appearing, toxic-appearing or diaphoretic.  Cardiovascular:     Rate and Rhythm: Normal rate and regular rhythm.     Heart sounds: Normal heart sounds. No murmur heard.   No friction rub. No gallop.  Pulmonary:     Effort: Pulmonary effort is normal. No respiratory distress.     Breath sounds: Normal breath sounds. No stridor. No wheezing, rhonchi or rales.  Chest:     Chest wall: No tenderness.  Neurological:     General: No focal deficit present.     Mental Status: He is alert and oriented to person, place, and time. Mental status is at baseline.  Psychiatric:        Mood and Affect: Mood normal.        Behavior: Behavior normal.        Thought Content: Thought content normal.        Judgment: Judgment normal.     BP 120/68   Pulse 100   Temp 98.2 F (36.8 C)   Resp 18   Ht _0  (1.676 m)   Wt 181 lb 6.4 oz (82.3 kg)   SpO2 99%   BMI 29.28 kg/m  Wt Readings from Last 3 Encounters:  08/16/21 181 lb 6.4 oz (82.3 kg)  04/15/21 175 lb 3.2 oz (79.5 kg)  03/29/21 173 lb 12.8 oz (78.8 kg)     Health Maintenance Due  Topic Date Due   COVID-19 Vaccine (3 - Booster for Pfizer series) 03/19/2020   HEMOGLOBIN A1C  11/14/2020   FOOT EXAM  05/02/2021   INFLUENZA VACCINE  05/20/2021   Zoster Vaccines- Shingrix (2 of 2) 05/24/2021   OPHTHALMOLOGY EXAM  05/30/2021    There are no preventive care reminders to display for this patient.  Lab Results  Component Value Date   TSH 0.797 05/14/2020   Lab Results  Component Value Date   WBC 6.6 05/14/2020   HGB 14.3 05/14/2020   HCT 42.2 05/14/2020   MCV 89 05/14/2020   PLT 216 03/02/2020   Lab Results  Component Value Date   NA 140 12/24/2020   K 4.6 12/24/2020   CO2 22 12/24/2020   GLUCOSE 106 (H) 12/24/2020   BUN 18 12/24/2020   CREATININE 0.90 12/24/2020   BILITOT 1.2 07/23/2021   ALKPHOS 52 07/23/2021   AST 18 07/23/2021   ALT 23 07/23/2021   PROT 6.5 07/23/2021   ALBUMIN 4.7 (H) 07/23/2021   CALCIUM 8.8 12/24/2020   ANIONGAP 12 03/06/2020   EGFR  86 12/24/2020   GFR 83.56 03/29/2018   Lab Results  Component Value Date   CHOL 146 07/23/2021   Lab Results  Component Value Date   HDL 42 07/23/2021   Lab Results  Component Value Date   LDLCALC 77 07/23/2021   Lab Results  Component Value Date   TRIG 154 (H) 07/23/2021   Lab Results  Component Value Date   CHOLHDL 3.5 07/23/2021   Lab Results  Component Value Date   HGBA1C 6.2 (H) 05/14/2020      Assessment & Plan:   Problem List Items Addressed This Visit       Digestive   GERD with esophagitis   Relevant Medications   esomeprazole (NEXIUM) 40 MG capsule   Other Relevant Orders   CBC with Differential/Platelet   Comprehensive metabolic panel    Lipid panel     Other   Hyperlipidemia   Relevant Orders   CBC with Differential/Platelet   Comprehensive metabolic panel   Lipid panel   Other Visit Diagnoses     Rash and nonspecific skin eruption    -  Primary   Relevant Medications   clotrimazole-betamethasone (LOTRISONE) cream   Need for shingles vaccine       Relevant Orders   Varicella-zoster vaccine IM   GAD (generalized anxiety disorder)       Relevant Medications   escitalopram (LEXAPRO) 20 MG tablet   hydrOXYzine (ATARAX/VISTARIL) 10 MG tablet   Seasonal allergies       Relevant Medications   montelukast (SINGULAIR) 10 MG tablet   hydrOXYzine (ATARAX/VISTARIL) 10 MG tablet       Meds ordered this encounter  Medications   clotrimazole-betamethasone (LOTRISONE) cream    Sig: Apply 1 application topically daily.    Dispense:  30 g    Refill:  0    Order Specific Question:   Supervising Provider    Answer:   Carlota Raspberry, JEFFREY R [[id number]]   esomeprazole (NEXIUM) 40 MG capsule    Sig: Take 1 capsule (40 mg total) by mouth as needed (for heartburn).    Dispense:  30 capsule    Refill:  5    Order Specific Question:   Supervising Provider    Answer:   Carlota Raspberry, JEFFREY R [[id number]]   escitalopram (LEXAPRO) 20 MG tablet    Sig: Take 1 tablet (20 mg total) by mouth daily.    Dispense:  90 tablet    Refill:  1    Order Specific Question:   Supervising Provider    Answer:   Carlota Raspberry, JEFFREY R [[id number]]   montelukast (SINGULAIR) 10 MG tablet    Sig: Take 1 tablet (10 mg total) by mouth at bedtime.    Dispense:  30 tablet    Refill:  3    Order Specific Question:   Supervising Provider    Answer:   Carlota Raspberry, JEFFREY R [[id number]]   hydrOXYzine (ATARAX/VISTARIL) 10 MG tablet    Sig: Take 1 tablet (10 mg total) by mouth at bedtime as needed.    Dispense:  30 tablet    Refill:  0    Order Specific Question:   Supervising Provider    Answer:   Carlota Raspberry, JEFFREY R [[id number]]    Follow-up: Return in about 6 months (around 02/14/2022) for  follow up .   PLAN Meds refilled Labs collected. Will follow up with the patient as warranted. Will give a betamethasone clotimazole combo - will not give gentamicin - reviewed ototoxicity risk, pt already  has substantial hearing loss. Return in 6 mo Patient encouraged to call clinic with any questions, comments, or concerns.  Maximiano Coss, NP

## 2021-08-20 ENCOUNTER — Other Ambulatory Visit: Payer: Self-pay

## 2021-08-20 ENCOUNTER — Other Ambulatory Visit (INDEPENDENT_AMBULATORY_CARE_PROVIDER_SITE_OTHER): Payer: Medicare Other

## 2021-08-20 DIAGNOSIS — K2100 Gastro-esophageal reflux disease with esophagitis, without bleeding: Secondary | ICD-10-CM

## 2021-08-20 DIAGNOSIS — E785 Hyperlipidemia, unspecified: Secondary | ICD-10-CM | POA: Diagnosis not present

## 2021-08-20 LAB — LIPID PANEL
Cholesterol: 144 mg/dL (ref 0–200)
HDL: 45.5 mg/dL (ref >39.00)
LDL Cholesterol: 65 mg/dL (ref 0–99)
NonHDL: 98.8
Total CHOL/HDL Ratio: 3
Triglycerides: 167 mg/dL — ABNORMAL HIGH (ref 0.0–149.0)
VLDL: 33.4 mg/dL (ref 0.0–40.0)

## 2021-08-20 LAB — CBC WITH DIFFERENTIAL/PLATELET
Basophils Absolute: 0.1 10*3/uL (ref 0.0–0.1)
Basophils Relative: 0.9 % (ref 0.0–3.0)
Eosinophils Absolute: 0.2 10*3/uL (ref 0.0–0.7)
Eosinophils Relative: 3.1 % (ref 0.0–5.0)
HCT: 42.4 % (ref 39.0–52.0)
Hemoglobin: 14 g/dL (ref 13.0–17.0)
Lymphocytes Relative: 23 % (ref 12.0–46.0)
Lymphs Abs: 1.7 10*3/uL (ref 0.7–4.0)
MCHC: 33 g/dL (ref 30.0–36.0)
MCV: 89.4 fl (ref 78.0–100.0)
Monocytes Absolute: 0.7 10*3/uL (ref 0.1–1.0)
Monocytes Relative: 10.3 % (ref 3.0–12.0)
Neutro Abs: 4.5 10*3/uL (ref 1.4–7.7)
Neutrophils Relative %: 62.7 % (ref 43.0–77.0)
Platelets: 189 10*3/uL (ref 150.0–400.0)
RBC: 4.74 Mil/uL (ref 4.22–5.81)
RDW: 14.6 % (ref 11.5–15.5)
WBC: 7.2 10*3/uL (ref 4.0–10.5)

## 2021-08-20 LAB — COMPREHENSIVE METABOLIC PANEL
ALT: 12 U/L (ref 0–53)
AST: 15 U/L (ref 0–37)
Albumin: 4.3 g/dL (ref 3.5–5.2)
Alkaline Phosphatase: 43 U/L (ref 39–117)
BUN: 19 mg/dL (ref 6–23)
CO2: 29 mEq/L (ref 19–32)
Calcium: 8.8 mg/dL (ref 8.4–10.5)
Chloride: 102 mEq/L (ref 96–112)
Creatinine, Ser: 0.98 mg/dL (ref 0.40–1.50)
GFR: 72.48 mL/min (ref >60.00)
Glucose, Bld: 130 mg/dL — ABNORMAL HIGH (ref 70–99)
Potassium: 4.1 mEq/L (ref 3.5–5.1)
Sodium: 139 mEq/L (ref 135–145)
Total Bilirubin: 1.1 mg/dL (ref 0.2–1.2)
Total Protein: 6.7 g/dL (ref 6.0–8.3)

## 2021-08-20 NOTE — Addendum Note (Signed)
Addended by: Isaiah Serge D on: 08/20/2021 09:18 AM   Modules accepted: Orders

## 2021-10-09 ENCOUNTER — Telehealth: Payer: Self-pay | Admitting: Registered Nurse

## 2021-10-09 NOTE — Telephone Encounter (Signed)
Left message for patient to call back and schedule Medicare Annual Wellness Visit (AWV) in office.  ° °If not able to come in office, please offer to do virtually or by telephone.  Left office number and my jabber #[phone]. ° °Last AWV:10/12/2019 ° °Please schedule at anytime with Nurse Health Advisor. °  °

## 2021-10-28 ENCOUNTER — Other Ambulatory Visit: Payer: Self-pay | Admitting: Registered Nurse

## 2021-10-28 DIAGNOSIS — H60393 Other infective otitis externa, bilateral: Secondary | ICD-10-CM

## 2021-12-17 ENCOUNTER — Telehealth: Payer: Self-pay

## 2021-12-17 MED ORDER — ENTRESTO 24-26 MG PO TABS: 1.0000 | ORAL_TABLET | Freq: Two times a day (BID) | ORAL | 3 refills | Status: DC

## 2021-12-17 NOTE — Telephone Encounter (Signed)
**Note De-Identified  Obfuscation** The pts completed [employer] pt asst application for Christian [name] was left at the office. I have completed the providers page and have e-mailed all to the nurse working with Dr Acie Fredrickson today so she can print a Entresto 24-26 mg prescription for # 180 with 3 refills, obtain his signature/date on both the application and the prescription, and to fax all to [employer] at the fax number written on the cover letter included.

## 2021-12-17 NOTE — Telephone Encounter (Signed)
Script printed out signed by Dr. Acie Fredrickson and faxed to [employer] as requested.

## 2022-01-22 ENCOUNTER — Encounter: Payer: Self-pay | Admitting: Cardiovascular Disease

## 2022-01-22 ENCOUNTER — Ambulatory Visit: Payer: Medicare Other | Admitting: Cardiovascular Disease

## 2022-01-22 VITALS — BP 124/68 | HR 92 | Ht 66.0 in | Wt 182.4 lb

## 2022-01-22 DIAGNOSIS — I2510 Atherosclerotic heart disease of native coronary artery without angina pectoris: Secondary | ICD-10-CM | POA: Diagnosis not present

## 2022-01-22 DIAGNOSIS — I5022 Chronic systolic (congestive) heart failure: Secondary | ICD-10-CM | POA: Diagnosis not present

## 2022-01-22 MED ORDER — ENTRESTO 24-26 MG PO TABS: 1.0000 | ORAL_TABLET | Freq: Two times a day (BID) | ORAL | 3 refills | Status: DC

## 2022-01-22 NOTE — Progress Notes (Signed)
?Cardiology Office Note:   ? ?Date:  01/22/2022  ? ?ID:  Christian [name], DOB 16-Jan-1941, MRN [id number] ? ?PCP:  Maximiano Coss, NP  ?Cardiologist:  Mertie Moores, MD  ? ?Referring MD: Maximiano Coss, NP  ? ?Problem list  ?1.  Hypertension ?2.  Hyperlipidemia ?3.  Diabetes mellitus ?4.  Chronic systolic congestive heart failure -  EF 20% in Nov. 2018 ? Normal coronaries ?5.  Myopathy - ? Due to statins  ? ? ?Chief Complaint  ?Patient presents with  ? Congestive Heart Failure  ? Hyperlipidemia  ?  ? ?December 18, 2017   ? ?Christian [name] is a 81 y.o. male with a hx of congestive heart failure, hypertension, hyperlipidemia. ? ?Met him in the hospital in Nov. 2018.  Was found to have CHF - EF 20%. ?No CP  ?Hyperlipidemia  - took statins in the rermote past ,  Has prolonged muscle pains in response to statins  ?Has been taking prednisone on occasion ?Has been on Coreg and Losartan  ? ?September 14, 2019:  ?Christian [name] is seen today for follow-up of his chronic systolic congestive heart failure. ?When I saw him last year we started him on Entresto. ?His left-ventricular function has improved ( from 30% - 65%) by echo in October, 2019.  He has a history of diabetes mellitus hypertension hyperlipidemia. ?Been intolerant to statins. ? ?Feels well .    No CP , no dyspnea  ? ?December 24, 2020: ?Christian [name] is seen today for a follow-up visit regarding his congestive heart failure, hypertension, hyperlipidemia. His original ejection fraction was 20%. He has been on Entresto and his ejection fraction has normalized. ? ?Feeling great  ?Had a stroke in May.  Has recovered fairly well.   Is now on Plavix  ? ?January 22, 2022: Christian [name] seen today for follow-up of his congestive heart failure, hypertension, hyperlipidemia and stroke.  He has been on Entresto.  His LV function has improved. ?Works out on an [employer], site fairly regularly.  His breathing is okay ?He has run out of his Delene Loll - will reorder  ? ?Past Medical History:  ?Diagnosis Date  ?  Allergic rhinitis due to pollen 03/29/2018  ? Chronic systolic heart failure ([facility]) - EF improved to normal on Rx   ? Echo 11/18: Mild LVH, EF 20, diffuse HK mild AI, a sending aorta 42 mm (mildly dilated), mild MR, mild TR, trivial PI, PASP 44 // Echo 3/19: mild LVH, EF 25-30, diff HK, Gr 1 DD, asc aorta 43 mm (mildly dilated), MAC, mild reduced RVSF // Limited echo 10/19: Mild concentric LVH, EF 08-65, grade 1 diastolic dysfunction, MAC, mild LAE   ? Coronary artery disease involving native coronary artery of native heart without angina pectoris 11/11/2017  ? R/L HC 11/18: pLAD 45, oD1 65 ; elevated PCWP (mean 29), moderate pulmonary hypertension, CO 3.66, CI 2.07  ? Depression with anxiety 07/08/2017  ? Essential hypertension 07/08/2017  ? GERD with esophagitis 01/11/2018  ? Hernia of abdominal cavity   ? Hyperlipidemia   ? Hyperlipidemia LDL goal <130 05/25/2017  ? The 10-year ASCVD risk score Mikey Bussing DC Brooke Bonito., et al., 2013) is: 31.1%   Values used to calculate the score:     Age: 85 years     Sex: Male     Is Non-Hispanic African American: No     Diabetic: No     Tobacco smoker: No     Systolic Blood Pressure: 784 mmHg     Is BP treated:  No     HDL Cholesterol: 68.7 mg/dL     Total Cholesterol: 258 mg/dL   ? Idiopathic inflammatory myopathy 05/25/2017  ? Neuropathy involving both lower extremities 05/25/2017  ? Nonischemic cardiomyopathy ([place]) 11/11/2017  ? LHC 09/16/17-nonobstructive CAD  ? Oropharyngeal dysphagia 01/11/2018  ? Type 2 diabetes mellitus with complication, without long-term current use of insulin ([place]) 07/08/2017  ? ? ?Past Surgical History:  ?Procedure Laterality Date  ? BACK SURGERY    ? HERNIA REPAIR    ? RIGHT/LEFT HEART CATH AND CORONARY ANGIOGRAPHY N/A 09/16/2017  ? Procedure: RIGHT/LEFT HEART CATH AND CORONARY ANGIOGRAPHY;  Surgeon: Martinique, Peter M, MD;  Location: [place] [facility];  Service: Cardiovascular;  Laterality: N/A;  ? SPINE SURGERY    ? ? ?Current Medications: ?Current Meds  ?Medication Sig  ?  acetaminophen (TYLENOL) 325 MG tablet Take 2 tablets (650 mg total) by mouth every 4 (four) hours as needed for mild pain (or temp > 37.5 C (99.5 F)).  ? carvedilol (COREG) 3.125 MG tablet TAKE 1 TABLET BY MOUTH DAILY  ? cholecalciferol (VITAMIN D) 1000 units tablet Take 1 tablet (1,000 Units total) by mouth daily.  ? clopidogrel (PLAVIX) 75 MG tablet TAKE 1 TABLET BY MOUTH DAILY  ? clotrimazole-betamethasone (LOTRISONE) cream Apply 1 application topically daily.  ? [employer] GAS RELIEF EXTRA STRENGTH 125 MG chewable tablet Chew 125 mg by mouth as needed.  ? doxycycline (VIBRA-TABS) 100 MG tablet Take 1 tablet (100 mg total) by mouth 2 (two) times daily.  ? escitalopram (LEXAPRO) 20 MG tablet Take 1 tablet (20 mg total) by mouth daily.  ? esomeprazole (NEXIUM) 40 MG capsule Take 1 capsule (40 mg total) by mouth as needed (for heartburn).  ? ezetimibe (ZETIA) 10 MG tablet Take 1 tablet (10 mg total) by mouth daily.  ? furosemide (LASIX) 40 MG tablet Take 0.5 tablets (20 mg total) by mouth 3 (three) times a week.  ? hydrOXYzine (ATARAX/VISTARIL) 10 MG tablet Take 1 tablet (10 mg total) by mouth at bedtime as needed.  ? montelukast (SINGULAIR) 10 MG tablet Take 1 tablet (10 mg total) by mouth at bedtime.  ? NEOMYCIN-POLYMYXIN-HYDROCORTISONE (CORTISPORIN) 1 % SOLN OTIC solution INSTILL 3 DROPS INTO BOTH EARS EVERY 8 HOURS  ? olopatadine (PATANOL) 0.1 % ophthalmic solution INSTILL 1 DROP INTO BOTH EYS DAILY AS NEEDED FOR ALLERGIES  ? Omega-3 Fatty Acids (FISH OIL) 1000 MG CAPS TAKE 1 CAPSULE (1,000 MG TOTAL) BY MOUTH EVERY MORNING.  ? ondansetron (ZOFRAN-ODT) 4 MG disintegrating tablet TAKE 2 TABLETS BY MOUTH AS NEEDED  ? rosuvastatin (CRESTOR) 20 MG tablet Take 1 tablet (20 mg total) by mouth daily.  ? traZODone (DESYREL) 50 MG tablet Take 0.5-1 tablets (25-50 mg total) by mouth at bedtime as needed for sleep.  ? triamcinolone cream (KENALOG) 0.1 % Apply 1 application topically daily.  ?  ? ?Allergies:   Amoxicillin, Bactrim  [sulfamethoxazole-trimethoprim], Cephalexin, Hydrocodone, Lipitor [atorvastatin], Oxycodone, Statins, and Vytorin [ezetimibe-simvastatin]  ? ?Social History  ? ?Socioeconomic History  ? Marital status: Single  ?  Spouse name: Not on file  ? Number of children: Not on file  ? Years of education: Not on file  ? Highest education level: Not on file  ?Occupational History  ? Not on file  ?Tobacco Use  ? Smoking status: Never  ? Smokeless tobacco: Never  ?Vaping Use  ? Vaping Use: Never used  ?Substance and Sexual Activity  ? Alcohol use: No  ? Drug use: No  ?  Sexual activity: Not Currently  ?Other Topics Concern  ? Not on file  ?Social History Narrative  ? Not on file  ? ?Social Determinants of Health  ? ?Financial Resource Strain: Not on file  ?Food Insecurity: Not on file  ?Transportation Needs: Not on file  ?Physical Activity: Not on file  ?Stress: Not on file  ?Social Connections: Not on file  ?  ? ?Family History: ?The patient's family history includes Early death in his father; Hypertension in his father; Kidney disease in his father; Stroke in his father and mother. There is no history of Cancer, Alcohol abuse, Diabetes, Heart disease, or Hyperlipidemia. ? ?ROS:   ?Please see the history of present illness.    ? All other systems reviewed and are negative. ? ?EKGs/Labs/Other Studies Reviewed:   ? ?The following studies were reviewed today: ? ? ? ? ?Recent Labs: ?08/20/2021: ALT 12; BUN 19; Creatinine, Ser 0.98; Hemoglobin 14.0; Platelets 189.0; Potassium 4.1; Sodium 139  ?Recent Lipid Panel ?   ?Component Value Date/Time  ? CHOL 144 08/20/2021 0919  ? CHOL 146 07/23/2021 1140  ? TRIG 167.0 (H) 08/20/2021 0919  ? HDL 45.50 08/20/2021 0919  ? HDL 42 07/23/2021 1140  ? CHOLHDL 3 08/20/2021 0919  ? VLDL 33.4 08/20/2021 0919  ? [place] 65 08/20/2021 0919  ? LDLCALC 77 07/23/2021 1140  ? ? ?Physical Exam: ?Blood pressure 124/68, pulse 92, height _0  (1.676 m), weight 182 lb 6.4 oz (82.7 kg), SpO2 96 %. ? ?GEN:  Well  nourished, well developed in no acute distress ?HEENT: Normal, he is very hard of hearing. ?NECK: No JVD; No carotid bruits ?LYMPHATICS: No lymphadenopathy ?CARDIAC: RRR , no murmurs, rubs, gallops ?RE

## 2022-01-22 NOTE — Patient Instructions (Addendum)
Medication Instructions:  ?Your physician recommends that you continue on your current medications as directed. Please refer to the Current Medication list given to you today. ? ?*If you need a refill on your cardiac medications before your next appointment, please call your pharmacy* ? ?Lab Work: ?NONE ?If you have labs (blood work) drawn today and your tests are completely normal, you will receive your results only by: ?MyChart Message (if you have MyChart) OR ?A paper copy in the mail ?If you have any lab test that is abnormal or we need to change your treatment, we will call you to review the results. ? ?Testing/Procedures: ?NONE ? ?Follow-Up: ?At [facility], you and your health needs are our priority.  As part of our continuing mission to provide you with exceptional heart care, we have created designated Provider Care Teams.  These Care Teams include your primary Cardiologist (physician) and Advanced Practice Providers (APPs -  Physician Assistants and Nurse Practitioners) who all work together to provide you with the care you need, when you need it. ? ?We recommend signing up for the patient portal called "MyChart".  Sign up information is provided on this After Visit Summary.  MyChart is used to connect with patients for Virtual Visits (Telemedicine).  Patients are able to view lab/test results, encounter notes, upcoming appointments, etc.  Non-urgent messages can be sent to your provider as well.   ?To learn more about what you can do with MyChart, go to [email or web address].   ? ?Your next appointment:   ?1 year(s) ? ?The format for your next appointment:   ?In Person ? ?Provider:   ?Philip Nahser, MD  or Vin Bhagat, PA-C or Scott Weaver, PA-C ?

## 2022-01-24 ENCOUNTER — Telehealth: Payer: Self-pay

## 2022-01-24 NOTE — Telephone Encounter (Signed)
Left message on VM requesting callback from patient. ? ?Patient stated at last office visit 01/22/22 he has been out of his Delene Loll 24-'26mg'$  for the past 6 weeks. Called to verify whether or not patient has used the 30-day free trial card for his [facility] prescription. ? ?Awaiting return call at this time. ?

## 2022-01-24 NOTE — Telephone Encounter (Signed)
Sample bottle of Entresto 24-'26mg'$  left for patient at [place] desk in sample pick-up bin. ? ?Called to notify patient, no answer left message to have patient come by office before 5pm to pick-up. ?

## 2022-02-10 ENCOUNTER — Encounter: Payer: Self-pay | Admitting: Registered Nurse

## 2022-02-10 ENCOUNTER — Ambulatory Visit (INDEPENDENT_AMBULATORY_CARE_PROVIDER_SITE_OTHER): Payer: Medicare Other | Admitting: Registered Nurse

## 2022-02-10 VITALS — BP 130/84 | HR 76 | Temp 99.0°F | Resp 18 | Ht 66.0 in | Wt 178.8 lb

## 2022-02-10 DIAGNOSIS — J22 Unspecified acute lower respiratory infection: Secondary | ICD-10-CM | POA: Diagnosis not present

## 2022-02-10 DIAGNOSIS — R051 Acute cough: Secondary | ICD-10-CM

## 2022-02-10 MED ORDER — BENZONATATE 100 MG PO CAPS: 100.0000 mg | ORAL_CAPSULE | Freq: Two times a day (BID) | ORAL | 0 refills | Status: DC | PRN

## 2022-02-10 MED ORDER — DOXYCYCLINE HYCLATE 100 MG PO TABS: 100.0000 mg | ORAL_TABLET | Freq: Two times a day (BID) | ORAL | 1 refills | Status: DC

## 2022-02-10 MED ORDER — ALBUTEROL SULFATE HFA 108 (90 BASE) MCG/ACT IN AERS: 2.0000 | INHALATION_SPRAY | Freq: Four times a day (QID) | RESPIRATORY_TRACT | 0 refills | Status: DC | PRN

## 2022-02-10 NOTE — Patient Instructions (Signed)
Mr. Gikas -  ? ?Great to see you.  ? ?Doxycycline twice a day for 10 days.  ? ?Albuterol every 4 hours as needed ? ?Tessalon three times a day as needed ? ?Ok to use mucinex twice a day ? ?Call if you need anything, ? ?Thanks, ? ?Rich  ?

## 2022-02-10 NOTE — Progress Notes (Signed)
? ?Acute Office Visit ? ?Subjective:  ? ? Patient ID: Christian [name], male    DOB: 02/22/1941, 81 y.o.   MRN: [id number] ? ?Chief Complaint  ?Patient presents with  ? Cough  ?  Pt reports cough, weakness, dizzy, and fatigued, started apx 10 days ago, called EMS yesterday due to difficulty breathing,   ? ? ?HPI ?Patient is in today for cough, weakness, fatigue.  ? ?Onset 10 days ago ?Worsening ?Called EMS yesterday due to dyspnea. ?Doing ok today - has taken mucinex with good effect ?Productive cough with discolored mucus ?Shortness of breath, pain from coughing ? ?No nvd, chills, sweats, palpitations, dependent edema, claudication, or other CV specific symptoms. ? ?Outpatient Medications Prior to Visit  ?Medication Sig Dispense Refill  ? acetaminophen (TYLENOL) 325 MG tablet Take 2 tablets (650 mg total) by mouth every 4 (four) hours as needed for mild pain (or temp > 37.5 C (99.5 F)).    ? carvedilol (COREG) 3.125 MG tablet TAKE 1 TABLET BY MOUTH DAILY 90 tablet 3  ? cholecalciferol (VITAMIN D) 1000 units tablet Take 1 tablet (1,000 Units total) by mouth daily. 30 tablet 0  ? clopidogrel (PLAVIX) 75 MG tablet TAKE 1 TABLET BY MOUTH DAILY 90 tablet 1  ? clotrimazole-betamethasone (LOTRISONE) cream Apply 1 application topically daily. 30 g 0  ? [facility] GAS RELIEF EXTRA STRENGTH 125 MG chewable tablet Chew 125 mg by mouth as needed.    ? escitalopram (LEXAPRO) 20 MG tablet Take 1 tablet (20 mg total) by mouth daily. 90 tablet 1  ? esomeprazole (NEXIUM) 40 MG capsule Take 1 capsule (40 mg total) by mouth as needed (for heartburn). 30 capsule 5  ? ezetimibe (ZETIA) 10 MG tablet Take 1 tablet (10 mg total) by mouth daily. 90 tablet 3  ? furosemide (LASIX) 40 MG tablet Take 0.5 tablets (20 mg total) by mouth 3 (three) times a week. 30 tablet 6  ? hydrOXYzine (ATARAX/VISTARIL) 10 MG tablet Take 1 tablet (10 mg total) by mouth at bedtime as needed. 30 tablet 0  ? montelukast (SINGULAIR) 10 MG tablet Take 1 tablet (10 mg total) by  mouth at bedtime. 30 tablet 3  ? NEOMYCIN-POLYMYXIN-HYDROCORTISONE (CORTISPORIN) 1 % SOLN OTIC solution INSTILL 3 DROPS INTO BOTH EARS EVERY 8 HOURS 10 mL 0  ? olopatadine (PATANOL) 0.1 % ophthalmic solution INSTILL 1 DROP INTO BOTH EYS DAILY AS NEEDED FOR ALLERGIES 5 mL 2  ? Omega-3 Fatty Acids (FISH OIL) 1000 MG CAPS TAKE 1 CAPSULE (1,000 MG TOTAL) BY MOUTH EVERY MORNING. 120 capsule 0  ? ondansetron (ZOFRAN-ODT) 4 MG disintegrating tablet TAKE 2 TABLETS BY MOUTH AS NEEDED 45 tablet 0  ? rosuvastatin (CRESTOR) 20 MG tablet Take 1 tablet (20 mg total) by mouth daily. 90 tablet 3  ? sacubitril-valsartan (ENTRESTO) 24-26 MG Take 1 tablet by mouth 2 (two) times daily. 180 tablet 3  ? traZODone (DESYREL) 50 MG tablet Take 0.5-1 tablets (25-50 mg total) by mouth at bedtime as needed for sleep. 30 tablet 3  ? triamcinolone cream (KENALOG) 0.1 % Apply 1 application topically daily. 30 g 0  ? doxycycline (VIBRA-TABS) 100 MG tablet Take 1 tablet (100 mg total) by mouth 2 (two) times daily. 20 tablet 0  ? ?No facility-administered medications prior to visit.  ? ? ?Review of Systems ?Perhpi  ? ?   ?Objective:  ?  ?BP 130/84   Pulse 76   Temp 99 ?F (37.2 ?C) (Oral)   Resp 18   Ht  $'5\' 6"'A$  (1.676 m)   Wt 178 lb 12.8 oz (81.1 kg)   SpO2 95%   BMI 28.86 kg/m?  ?Physical Exam ?Constitutional:   ?   General: He is not in acute distress. ?   Appearance: Normal appearance. He is normal weight. He is not ill-appearing, toxic-appearing or diaphoretic.  ?Cardiovascular:  ?   Rate and Rhythm: Normal rate and regular rhythm.  ?   Heart sounds: Normal heart sounds. No murmur heard. ?  No friction rub. No gallop.  ?Pulmonary:  ?   Effort: Pulmonary effort is normal. No respiratory distress.  ?   Breath sounds: No stridor. Wheezing (throughout) present. No rhonchi or rales.  ?Chest:  ?   Chest wall: No tenderness.  ?Neurological:  ?   General: No focal deficit present.  ?   Mental Status: He is alert and oriented to person, place, and  time. Mental status is at baseline.  ?Psychiatric:     ?   Mood and Affect: Mood normal.     ?   Behavior: Behavior normal.     ?   Thought Content: Thought content normal.     ?   Judgment: Judgment normal.  ? ? ?No results found for any visits on 02/10/22. ? ? ?   ?Assessment & Plan:  ?1. Lower respiratory infection ?- doxycycline (VIBRA-TABS) 100 MG tablet; Take 1 tablet (100 mg total) by mouth 2 (two) times daily.  Dispense: 20 tablet; Refill: 1 ? ?2. Acute cough ?- doxycycline (VIBRA-TABS) 100 MG tablet; Take 1 tablet (100 mg total) by mouth 2 (two) times daily.  Dispense: 20 tablet; Refill: 1 ?- albuterol (VENTOLIN HFA) 108 (90 Base) MCG/ACT inhaler; Inhale 2 puffs into the lungs every 6 (six) hours as needed for wheezing or shortness of breath.  Dispense: 8 g; Refill: 0 ?- benzonatate (TESSALON) 100 MG capsule; Take 1 capsule (100 mg total) by mouth 2 (two) times daily as needed for cough.  Dispense: 20 capsule; Refill: 0 ? ? ? ?Meds ordered this encounter  ?Medications  ? doxycycline (VIBRA-TABS) 100 MG tablet  ?  Sig: Take 1 tablet (100 mg total) by mouth 2 (two) times daily.  ?  Dispense:  20 tablet  ?  Refill:  1  ?  Order Specific Question:   Supervising Provider  ?  Answer:   Carlota Raspberry, JEFFREY R [[id number]]  ? albuterol (VENTOLIN HFA) 108 (90 Base) MCG/ACT inhaler  ?  Sig: Inhale 2 puffs into the lungs every 6 (six) hours as needed for wheezing or shortness of breath.  ?  Dispense:  8 g  ?  Refill:  0  ?  Order Specific Question:   Supervising Provider  ?  Answer:   Carlota Raspberry, JEFFREY R [[id number]]  ? benzonatate (TESSALON) 100 MG capsule  ?  Sig: Take 1 capsule (100 mg total) by mouth 2 (two) times daily as needed for cough.  ?  Dispense:  20 capsule  ?  Refill:  0  ?  Order Specific Question:   Supervising Provider  ?  Answer:   Carlota Raspberry, JEFFREY R [[id number]]  ? ? ?Return if symptoms worsen or fail to improve. ? ?PLAN ?Doxycycline '100mg'$  po bid x 10 days ?Albuterol and tessalon for relief ?Ok to continue mucinex ?Return if  worsening or failing to improve ?Patient encouraged to call clinic with any questions, comments, or concerns. ? ? ?Maximiano Coss, NP ?

## 2022-02-17 ENCOUNTER — Ambulatory Visit: Payer: Medicare Other | Admitting: Registered Nurse

## 2022-03-03 ENCOUNTER — Ambulatory Visit: Payer: Medicare Other | Admitting: Registered Nurse

## 2022-03-27 ENCOUNTER — Ambulatory Visit: Payer: Medicare Other | Admitting: Registered Nurse

## 2022-04-04 ENCOUNTER — Other Ambulatory Visit: Payer: Self-pay | Admitting: Registered Nurse

## 2022-04-04 DIAGNOSIS — K2100 Gastro-esophageal reflux disease with esophagitis, without bleeding: Secondary | ICD-10-CM

## 2022-04-29 ENCOUNTER — Ambulatory Visit (INDEPENDENT_AMBULATORY_CARE_PROVIDER_SITE_OTHER): Payer: Medicare Other | Admitting: Registered Nurse

## 2022-04-29 ENCOUNTER — Other Ambulatory Visit: Payer: Self-pay

## 2022-04-29 VITALS — BP 126/70 | HR 93 | Temp 98.4°F | Resp 18 | Ht 66.0 in | Wt 176.4 lb

## 2022-04-29 DIAGNOSIS — R21 Rash and other nonspecific skin eruption: Secondary | ICD-10-CM | POA: Diagnosis not present

## 2022-04-29 DIAGNOSIS — F411 Generalized anxiety disorder: Secondary | ICD-10-CM

## 2022-04-29 DIAGNOSIS — R110 Nausea: Secondary | ICD-10-CM | POA: Diagnosis not present

## 2022-04-29 DIAGNOSIS — K2100 Gastro-esophageal reflux disease with esophagitis, without bleeding: Secondary | ICD-10-CM

## 2022-04-29 DIAGNOSIS — R051 Acute cough: Secondary | ICD-10-CM

## 2022-04-29 LAB — COMPREHENSIVE METABOLIC PANEL WITH GFR
ALT: 10 U/L (ref 0–53)
AST: 16 U/L (ref 0–37)
Albumin: 4.3 g/dL (ref 3.5–5.2)
Alkaline Phosphatase: 40 U/L (ref 39–117)
BUN: 18 mg/dL (ref 6–23)
CO2: 29 meq/L (ref 19–32)
Calcium: 9.1 mg/dL (ref 8.4–10.5)
Chloride: 101 meq/L (ref 96–112)
Creatinine, Ser: 0.95 mg/dL (ref 0.40–1.50)
GFR: 74.87 mL/min
Glucose, Bld: 138 mg/dL — ABNORMAL HIGH (ref 70–99)
Potassium: 4 meq/L (ref 3.5–5.1)
Sodium: 137 meq/L (ref 135–145)
Total Bilirubin: 0.9 mg/dL (ref 0.2–1.2)
Total Protein: 6.6 g/dL (ref 6.0–8.3)

## 2022-04-29 LAB — CBC WITH DIFFERENTIAL/PLATELET
Basophils Absolute: 0.1 10*3/uL (ref 0.0–0.1)
Basophils Relative: 0.8 % (ref 0.0–3.0)
Eosinophils Absolute: 0.2 10*3/uL (ref 0.0–0.7)
Eosinophils Relative: 3.7 % (ref 0.0–5.0)
HCT: 43.2 % (ref 39.0–52.0)
Hemoglobin: 13.9 g/dL (ref 13.0–17.0)
Lymphocytes Relative: 23.4 % (ref 12.0–46.0)
Lymphs Abs: 1.6 10*3/uL (ref 0.7–4.0)
MCHC: 32.2 g/dL (ref 30.0–36.0)
MCV: 89.2 fl (ref 78.0–100.0)
Monocytes Absolute: 0.7 10*3/uL (ref 0.1–1.0)
Monocytes Relative: 10.4 % (ref 3.0–12.0)
Neutro Abs: 4.2 10*3/uL (ref 1.4–7.7)
Neutrophils Relative %: 61.7 % (ref 43.0–77.0)
Platelets: 196 10*3/uL (ref 150.0–400.0)
RBC: 4.85 Mil/uL (ref 4.22–5.81)
RDW: 14.7 % (ref 11.5–15.5)
WBC: 6.8 10*3/uL (ref 4.0–10.5)

## 2022-04-29 LAB — LIPID PANEL
Cholesterol: 195 mg/dL (ref 0–200)
HDL: 45.1 mg/dL (ref >39.00)
LDL Cholesterol: 112 mg/dL — ABNORMAL HIGH (ref 0–99)
NonHDL: 149.45
Total CHOL/HDL Ratio: 4
Triglycerides: 188 mg/dL — ABNORMAL HIGH (ref 0.0–149.0)
VLDL: 37.6 mg/dL (ref 0.0–40.0)

## 2022-04-29 LAB — MICROALBUMIN / CREATININE URINE RATIO
Creatinine,U: 121.3 mg/dL
Microalb Creat Ratio: 2.8 mg/g (ref 0.0–30.0)
Microalb, Ur: 3.4 mg/dL — ABNORMAL HIGH (ref 0.0–1.9)

## 2022-04-29 LAB — TSH: TSH: 1.73 u[IU]/mL (ref 0.35–5.50)

## 2022-04-29 LAB — HEMOGLOBIN A1C: Hgb A1c MFr Bld: 7.5 % — ABNORMAL HIGH (ref 4.6–6.5)

## 2022-04-29 MED ORDER — ESOMEPRAZOLE MAGNESIUM 40 MG PO CPDR: DELAYED_RELEASE_CAPSULE | ORAL | 5 refills | Status: DC

## 2022-04-29 MED ORDER — ALBUTEROL SULFATE HFA 108 (90 BASE) MCG/ACT IN AERS: 2.0000 | INHALATION_SPRAY | Freq: Four times a day (QID) | RESPIRATORY_TRACT | 0 refills | Status: DC | PRN

## 2022-04-29 MED ORDER — ESCITALOPRAM OXALATE 20 MG PO TABS: 20.0000 mg | ORAL_TABLET | Freq: Every day | ORAL | 1 refills | Status: DC

## 2022-04-29 MED ORDER — CLOBETASOL PROPIONATE 0.05 % EX CREA: 1.0000 | TOPICAL_CREAM | Freq: Two times a day (BID) | CUTANEOUS | 0 refills | Status: DC

## 2022-04-29 MED ORDER — ONDANSETRON 4 MG PO TBDP: ORAL_TABLET | ORAL | 3 refills | Status: DC

## 2022-04-29 NOTE — Patient Instructions (Addendum)
Christian [name] -      If you have lab work done today you will be contacted with your lab results within the next 2 weeks.  If you have not heard from [place] then please contact us. The fastest way to get your results is to register for My Chart.   IF you received an x-ray today, you will receive an invoice from [facility]. Please contact [facility] at [phone] with questions or concerns regarding your invoice.   IF you received labwork today, you will receive an invoice from [facility]. Please contact [facility] at [phone] with questions or concerns regarding your invoice.   Our billing staff will not be able to assist you with questions regarding bills from these companies.  You will be contacted with the lab results as soon as they are available. The fastest way to get your results is to activate your My Chart account. Instructions are located on the last page of this paperwork. If you have not heard from [place] regarding the results in 2 weeks, please contact this office.

## 2022-04-29 NOTE — Progress Notes (Signed)
Established Patient Office Visit  Subjective:  Patient ID: Christian [name], male    DOB: 10/15/1941  Age: 81 y.o. MRN: [id number]  CC:  Chief Complaint  Patient presents with   Follow-up    Patient is here for follow up.    HPI Fallon Howerter presents for 6 mo follow up  Doing well, no concerns.  Depression Lexapro '20mg'$  po qd  Good effect, no Ae. Hopes to continue.   Skin rash Upper arms, some upper back Red swollen areas, itch, no pain No drainage He will occ cut these off with a razor, no sign of infection No collection near axillae or groin. No contacts with rash No new exposures, no new meds for some time Rash is steady. Triamcinolone in the past did not help much  GERD Doing well with nexium daily prn. Takes intermittently. No new GI symptoms. Weight steady.  SHOB Allergies vs asthma. Occ use of albuterol. No AE. Sparing use. Would like refill.   NV Bilious vomiting Occ, not consistent, some foods more than others, sometimes with GERD. No clear pattern. Ongoing for some time. GI work up in the past negative for any serious etiology. Has done well with sparing use of zofran in the past. Will continue. No AE - denies constipation.    Outpatient Medications Prior to Visit  Medication Sig Dispense Refill   acetaminophen (TYLENOL) 325 MG tablet Take 2 tablets (650 mg total) by mouth every 4 (four) hours as needed for mild pain (or temp > 37.5 C (99.5 F)).     benzonatate (TESSALON) 100 MG capsule Take 1 capsule (100 mg total) by mouth 2 (two) times daily as needed for cough. 20 capsule 0   carvedilol (COREG) 3.125 MG tablet TAKE 1 TABLET BY MOUTH DAILY 90 tablet 3   cholecalciferol (VITAMIN D) 1000 units tablet Take 1 tablet (1,000 Units total) by mouth daily. 30 tablet 0   clopidogrel (PLAVIX) 75 MG tablet TAKE 1 TABLET BY MOUTH DAILY 90 tablet 1   clotrimazole-betamethasone (LOTRISONE) cream Apply 1 application topically daily. 30 g 0   [facility] GAS RELIEF EXTRA  STRENGTH 125 MG chewable tablet Chew 125 mg by mouth as needed.     doxycycline (VIBRA-TABS) 100 MG tablet Take 1 tablet (100 mg total) by mouth 2 (two) times daily. 20 tablet 1   ezetimibe (ZETIA) 10 MG tablet Take 1 tablet (10 mg total) by mouth daily. 90 tablet 3   furosemide (LASIX) 40 MG tablet Take 0.5 tablets (20 mg total) by mouth 3 (three) times a week. 30 tablet 6   hydrOXYzine (ATARAX/VISTARIL) 10 MG tablet Take 1 tablet (10 mg total) by mouth at bedtime as needed. 30 tablet 0   montelukast (SINGULAIR) 10 MG tablet Take 1 tablet (10 mg total) by mouth at bedtime. 30 tablet 3   NEOMYCIN-POLYMYXIN-HYDROCORTISONE (CORTISPORIN) 1 % SOLN OTIC solution INSTILL 3 DROPS INTO BOTH EARS EVERY 8 HOURS 10 mL 0   olopatadine (PATANOL) 0.1 % ophthalmic solution INSTILL 1 DROP INTO BOTH EYS DAILY AS NEEDED FOR ALLERGIES 5 mL 2   Omega-3 Fatty Acids (FISH OIL) 1000 MG CAPS TAKE 1 CAPSULE (1,000 MG TOTAL) BY MOUTH EVERY MORNING. 120 capsule 0   rosuvastatin (CRESTOR) 20 MG tablet Take 1 tablet (20 mg total) by mouth daily. 90 tablet 3   sacubitril-valsartan (ENTRESTO) 24-26 MG Take 1 tablet by mouth 2 (two) times daily. 180 tablet 3   traZODone (DESYREL) 50 MG tablet Take 0.5-1 tablets (25-50 mg total)  by mouth at bedtime as needed for sleep. 30 tablet 3   albuterol (VENTOLIN HFA) 108 (90 Base) MCG/ACT inhaler Inhale 2 puffs into the lungs every 6 (six) hours as needed for wheezing or shortness of breath. 8 g 0   escitalopram (LEXAPRO) 20 MG tablet Take 1 tablet (20 mg total) by mouth daily. 90 tablet 1   esomeprazole (NEXIUM) 40 MG capsule TAKE 1 CAPSULE BY MOUTH DAILY AS NEEDED FOR HEARTBURN 30 capsule 5   ondansetron (ZOFRAN-ODT) 4 MG disintegrating tablet TAKE 2 TABLETS BY MOUTH AS NEEDED 45 tablet 0   triamcinolone cream (KENALOG) 0.1 % Apply 1 application topically daily. 30 g 0   No facility-administered medications prior to visit.    Review of Systems  Constitutional: Negative.   HENT:  Negative.    Eyes: Negative.   Respiratory: Negative.    Cardiovascular: Negative.   Gastrointestinal: Negative.   Genitourinary: Negative.   Musculoskeletal: Negative.   Skin: Negative.   Neurological: Negative.   Psychiatric/Behavioral: Negative.    All other systems reviewed and are negative.     Objective:     BP 126/70   Pulse 93   Temp 98.4 F (36.9 C) (Temporal)   Resp 18   Ht '5\' 6"'$  (1.676 m)   Wt 176 lb 6.4 oz (80 kg)   SpO2 99%   BMI 28.47 kg/m   Wt Readings from Last 3 Encounters:  04/29/22 176 lb 6.4 oz (80 kg)  02/10/22 178 lb 12.8 oz (81.1 kg)  01/22/22 182 lb 6.4 oz (82.7 kg)   Physical Exam Constitutional:      General: He is not in acute distress.    Appearance: Normal appearance. He is normal weight. He is not ill-appearing, toxic-appearing or diaphoretic.  Cardiovascular:     Rate and Rhythm: Normal rate and regular rhythm.     Heart sounds: Normal heart sounds. No murmur heard.    No friction rub. No gallop.  Pulmonary:     Effort: Pulmonary effort is normal. No respiratory distress.     Breath sounds: Normal breath sounds. No stridor. No wheezing, rhonchi or rales.  Chest:     Chest wall: No tenderness.  Skin:    General: Skin is warm and dry.     Coloration: Skin is not jaundiced.     Findings: Rash (red firm bumps on upper arms. unclear etiology.) present. No bruising or lesion.  Neurological:     General: No focal deficit present.     Mental Status: He is alert and oriented to person, place, and time. Mental status is at baseline.  Psychiatric:        Mood and Affect: Mood normal.        Behavior: Behavior normal.        Thought Content: Thought content normal.        Judgment: Judgment normal.     Results for orders placed or performed in visit on 04/29/22  Comprehensive metabolic panel  Result Value Ref Range   Sodium 137 135 - 145 mEq/L   Potassium 4.0 3.5 - 5.1 mEq/L   Chloride 101 96 - 112 mEq/L   CO2 29 19 - 32 mEq/L    Glucose, Bld 138 (H) 70 - 99 mg/dL   BUN 18 6 - 23 mg/dL   Creatinine, Ser 0.95 0.40 - 1.50 mg/dL   Total Bilirubin 0.9 0.2 - 1.2 mg/dL   Alkaline Phosphatase 40 39 - 117 U/L   AST 16 0 -  37 U/L   ALT 10 0 - 53 U/L   Total Protein 6.6 6.0 - 8.3 g/dL   Albumin 4.3 3.5 - 5.2 g/dL   GFR 74.87 >60.00 mL/min   Calcium 9.1 8.4 - 10.5 mg/dL  Hemoglobin A1c  Result Value Ref Range   Hgb A1c MFr Bld 7.5 (H) 4.6 - 6.5 %  CBC with Differential/Platelet  Result Value Ref Range   WBC 6.8 4.0 - 10.5 K/uL   RBC 4.85 4.22 - 5.81 Mil/uL   Hemoglobin 13.9 13.0 - 17.0 g/dL   HCT 43.2 39.0 - 52.0 %   MCV 89.2 78.0 - 100.0 fl   MCHC 32.2 30.0 - 36.0 g/dL   RDW 14.7 11.5 - 15.5 %   Platelets 196.0 150.0 - 400.0 K/uL   Neutrophils Relative % 61.7 43.0 - 77.0 %   Lymphocytes Relative 23.4 12.0 - 46.0 %   Monocytes Relative 10.4 3.0 - 12.0 %   Eosinophils Relative 3.7 0.0 - 5.0 %   Basophils Relative 0.8 0.0 - 3.0 %   Neutro Abs 4.2 1.4 - 7.7 K/uL   Lymphs Abs 1.6 0.7 - 4.0 K/uL   Monocytes Absolute 0.7 0.1 - 1.0 K/uL   Eosinophils Absolute 0.2 0.0 - 0.7 K/uL   Basophils Absolute 0.1 0.0 - 0.1 K/uL  Lipid panel  Result Value Ref Range   Cholesterol 195 0 - 200 mg/dL   Triglycerides 188.0 (H) 0.0 - 149.0 mg/dL   HDL 45.10 >39.00 mg/dL   VLDL 37.6 0.0 - 40.0 mg/dL   LDL Cholesterol 112 (H) 0 - 99 mg/dL   Total CHOL/HDL Ratio 4    NonHDL 149.45   TSH  Result Value Ref Range   TSH 1.73 0.35 - 5.50 uIU/mL  Microalbumin / creatinine urine ratio  Result Value Ref Range   Microalb, Ur 3.4 (H) 0.0 - 1.9 mg/dL   Creatinine,U 121.3 mg/dL   Microalb Creat Ratio 2.8 0.0 - 30.0 mg/g      The ASCVD Risk score (Arnett DK, et al., 2019) failed to calculate for the following reasons:   The 2019 ASCVD risk score is only valid for ages 30 to 52   The patient has a prior MI or stroke diagnosis    Assessment & Plan:   Problem List Items Addressed This Visit       Digestive   GERD with esophagitis     Continue prn use of esomeprazole.       Relevant Medications   esomeprazole (NEXIUM) 40 MG capsule   Other Relevant Orders   Comprehensive metabolic panel (Completed)   Hemoglobin A1c (Completed)   CBC with Differential/Platelet (Completed)   Lipid panel (Completed)   TSH (Completed)   Microalbumin / creatinine urine ratio (Completed)     Musculoskeletal and Integument   Rash and nonspecific skin eruption - Primary    Unclear etiology. Will send clobetasol. Reviewed risks, benefits, and side effects, pt voices understanding. Refer to Derm. Would prefer to see Dr. Jamse Belfast       Relevant Medications   clobetasol cream (TEMOVATE) 0.05 %   Other Relevant Orders   Ambulatory referral to Dermatology     Other   Nausea without vomiting    Continue prn use of zofran. If symptoms worsen or change, he will be seen by provider.       Relevant Medications   ondansetron (ZOFRAN-ODT) 4 MG disintegrating tablet   Other Relevant Orders   Comprehensive metabolic panel (Completed)  Hemoglobin A1c (Completed)   CBC with Differential/Platelet (Completed)   Lipid panel (Completed)   TSH (Completed)   Microalbumin / creatinine urine ratio (Completed)   GAD (generalized anxiety disorder)    Well controlled with lexapro. He will continue on this.       Relevant Medications   escitalopram (LEXAPRO) 20 MG tablet   Other Relevant Orders   Comprehensive metabolic panel (Completed)   Hemoglobin A1c (Completed)   CBC with Differential/Platelet (Completed)   Lipid panel (Completed)   TSH (Completed)   Microalbumin / creatinine urine ratio (Completed)   Other Visit Diagnoses     Acute cough       Relevant Medications   albuterol (VENTOLIN HFA) 108 (90 Base) MCG/ACT inhaler   Other Relevant Orders   Comprehensive metabolic panel (Completed)   Hemoglobin A1c (Completed)   CBC with Differential/Platelet (Completed)   Lipid panel (Completed)   TSH (Completed)   Microalbumin /  creatinine urine ratio (Completed)       Meds ordered this encounter  Medications   ondansetron (ZOFRAN-ODT) 4 MG disintegrating tablet    Sig: TAKE 2 TABLETS BY MOUTH AS NEEDED    Dispense:  45 tablet    Refill:  3    Order Specific Question:   Supervising Provider    Answer:   Carlota Raspberry, JEFFREY R [[id number]]   esomeprazole (NEXIUM) 40 MG capsule    Sig: TAKE 1 CAPSULE BY MOUTH DAILY AS NEEDED FOR HEARTBURN    Dispense:  30 capsule    Refill:  5    Order Specific Question:   Supervising Provider    Answer:   Carlota Raspberry, JEFFREY R [[id number]]   escitalopram (LEXAPRO) 20 MG tablet    Sig: Take 1 tablet (20 mg total) by mouth daily.    Dispense:  90 tablet    Refill:  1    Order Specific Question:   Supervising Provider    Answer:   Carlota Raspberry, JEFFREY R [[id number]]   albuterol (VENTOLIN HFA) 108 (90 Base) MCG/ACT inhaler    Sig: Inhale 2 puffs into the lungs every 6 (six) hours as needed for wheezing or shortness of breath.    Dispense:  8 g    Refill:  0    Order Specific Question:   Supervising Provider    Answer:   Carlota Raspberry, JEFFREY R [[id number]]   clobetasol cream (TEMOVATE) 0.05 %    Sig: Apply 1 Application topically 2 (two) times daily.    Dispense:  30 g    Refill:  0    Order Specific Question:   Supervising Provider    Answer:   Carlota Raspberry, JEFFREY R [[id number]]    No follow-ups on file.    Maximiano Coss, NP

## 2022-04-29 NOTE — Assessment & Plan Note (Signed)
Well controlled with lexapro. He will continue on this.

## 2022-04-29 NOTE — Assessment & Plan Note (Signed)
Continue prn use of esomeprazole.

## 2022-04-29 NOTE — Assessment & Plan Note (Signed)
Continue prn use of zofran. If symptoms worsen or change, he will be seen by provider.

## 2022-04-29 NOTE — Assessment & Plan Note (Signed)
Unclear etiology. Will send clobetasol. Reviewed risks, benefits, and side effects, pt voices understanding. Refer to Derm. Would prefer to see Dr. Jamse Belfast

## 2022-10-27 ENCOUNTER — Encounter: Payer: Self-pay | Admitting: Physician Assistant

## 2022-10-27 ENCOUNTER — Ambulatory Visit (INDEPENDENT_AMBULATORY_CARE_PROVIDER_SITE_OTHER): Payer: Medicare Other | Admitting: Physician Assistant

## 2022-10-27 VITALS — BP 110/72 | HR 109 | Temp 98.2°F | Ht 66.0 in | Wt 174.4 lb

## 2022-10-27 DIAGNOSIS — R051 Acute cough: Secondary | ICD-10-CM | POA: Diagnosis not present

## 2022-10-27 DIAGNOSIS — E118 Type 2 diabetes mellitus with unspecified complications: Secondary | ICD-10-CM | POA: Diagnosis not present

## 2022-10-27 DIAGNOSIS — G479 Sleep disorder, unspecified: Secondary | ICD-10-CM

## 2022-10-27 DIAGNOSIS — I63531 Cerebral infarction due to unspecified occlusion or stenosis of right posterior cerebral artery: Secondary | ICD-10-CM

## 2022-10-27 DIAGNOSIS — I5022 Chronic systolic (congestive) heart failure: Secondary | ICD-10-CM

## 2022-10-27 DIAGNOSIS — F411 Generalized anxiety disorder: Secondary | ICD-10-CM

## 2022-10-27 DIAGNOSIS — J302 Other seasonal allergic rhinitis: Secondary | ICD-10-CM

## 2022-10-27 DIAGNOSIS — K2100 Gastro-esophageal reflux disease with esophagitis, without bleeding: Secondary | ICD-10-CM

## 2022-10-27 MED ORDER — TRAZODONE HCL 50 MG PO TABS: 25.0000 mg | ORAL_TABLET | Freq: Every evening | ORAL | 3 refills | Status: DC | PRN

## 2022-10-27 MED ORDER — ESOMEPRAZOLE MAGNESIUM 40 MG PO CPDR: DELAYED_RELEASE_CAPSULE | ORAL | 5 refills | Status: DC

## 2022-10-27 MED ORDER — MONTELUKAST SODIUM 10 MG PO TABS: 10.0000 mg | ORAL_TABLET | Freq: Every day | ORAL | 3 refills | Status: DC

## 2022-10-27 MED ORDER — BENZONATATE 100 MG PO CAPS: 100.0000 mg | ORAL_CAPSULE | Freq: Two times a day (BID) | ORAL | 0 refills | Status: DC | PRN

## 2022-10-27 MED ORDER — ESCITALOPRAM OXALATE 20 MG PO TABS: 20.0000 mg | ORAL_TABLET | Freq: Every day | ORAL | 1 refills | Status: DC

## 2022-10-27 NOTE — Patient Instructions (Signed)
It was great to see you!  I have sent in benzonatate for your cough  I will review all meds and send in refills  If cough does not improve by Friday, call/message [place]  My goal is to keep your HgbA1c UNDER 7 given your history of stroke. I may add metformin once blood work returns.  Please follow-up in 6 months  Take care,  Inda Coke PA-C

## 2022-10-27 NOTE — Progress Notes (Signed)
Christian [name] is a 82 y.o. male here for transfer of care.  History of Present Illness:   Chief Complaint  Patient presents with   Transfer of care   Cough    Pt c/o non-productive cough started yesterday.   He is present with his ex-wife, who is his caregiver. Has family in [place] and up [place].   Cough Productive cough started yesterday. Denies: fevers, chills, body aches, SOB, n/v/d Takes Singulair 10 mg daily at times; Albuterol prn -- has not required Denies any sick contacts Mild appetite   Diabetes 6 month follow-up. Current DM meds: none. Blood sugars at home are: not checked.  Denies: hypoglycemic or hyperglycemic episodes or symptoms. This patient's diabetes is complicated by HLD, CAD, hx of CVA.  Lab Results  Component Value Date   HGBA1C 7.5 (H) 04/29/2022   GAD Takes lexapro 20 mg daily and trazodone 25-50 mg nightly prn. Currently managing well. Denies SI/HI.  GERD Takes nexium 40 mg prn. Needs refill. Symptoms are stable when taking medication.  CHF and HLD; CVA He follows with Dr. Acie Fredrickson. Last seen on 01/22/22. He takes entresto 24-26 mg, plavix 75 mg daily, coreg 3.125 mg daily, crestor 20 mg daily, zetia 10 mg daily. He does not see neurology any more.  Past Medical History:  Diagnosis Date   Allergic rhinitis due to pollen 9/38/1829   Chronic systolic heart failure ([place]) - EF improved to normal on Rx    Echo 11/18: Mild LVH, EF 20, diffuse HK mild AI, a sending aorta 42 mm (mildly dilated), mild MR, mild TR, trivial PI, PASP 44 // Echo 3/19: mild LVH, EF 25-30, diff HK, Gr 1 DD, asc aorta 43 mm (mildly dilated), MAC, mild reduced RVSF // Limited echo 10/19: Mild concentric LVH, EF 93-71, grade 1 diastolic dysfunction, MAC, mild LAE    Coronary artery disease involving native coronary artery of native heart without angina pectoris 11/11/2017   R/L HC 11/18: pLAD 45, oD1 65 ; elevated PCWP (mean 29), moderate pulmonary hypertension, CO 3.66, CI 2.07    Depression with anxiety 07/08/2017   Essential hypertension 07/08/2017   GERD with esophagitis 01/11/2018   Hernia of abdominal cavity    Hyperlipidemia    Hyperlipidemia LDL goal <130 05/25/2017   The 10-year ASCVD risk score Mikey Bussing DC Jr., et al., 2013) is: 31.1%   Values used to calculate the score:     Age: 9 years     Sex: Male     Is Non-Hispanic African American: No     Diabetic: No     Tobacco smoker: No     Systolic Blood Pressure: 696 mmHg     Is BP treated: No     HDL Cholesterol: 68.7 mg/dL     Total Cholesterol: 258 mg/dL    Idiopathic inflammatory myopathy 05/25/2017   Neuropathy involving both lower extremities 05/25/2017   Nonischemic cardiomyopathy ([place]) 11/11/2017   LHC 09/16/17-nonobstructive CAD   Oropharyngeal dysphagia 01/11/2018   Type 2 diabetes mellitus with complication, without long-term current use of insulin ([place]) 07/08/2017     Social History   Tobacco Use   Smoking status: Never   Smokeless tobacco: Never  Vaping Use   Vaping Use: Never used  Substance Use Topics   Alcohol use: No   Drug use: No    Past Surgical History:  Procedure Laterality Date   BACK SURGERY     HERNIA REPAIR     RIGHT/LEFT HEART CATH AND CORONARY ANGIOGRAPHY N/A  09/16/2017   Procedure: RIGHT/LEFT HEART CATH AND CORONARY ANGIOGRAPHY;  Surgeon: Martinique, Peter M, MD;  Location: [facility];  Service: Cardiovascular;  Laterality: N/A;   SPINE SURGERY      Family History  Problem Relation Age of Onset   Stroke Mother    Kidney disease Father    Stroke Father    Hypertension Father    Early death Father    Cancer Neg Hx    Alcohol abuse Neg Hx    Diabetes Neg Hx    Heart disease Neg Hx    Hyperlipidemia Neg Hx     Allergies  Allergen Reactions   Amoxicillin Hives   Bactrim [Sulfamethoxazole-Trimethoprim] Other (See Comments)    Dried out his eyes   Cephalexin Itching   Hydrocodone Itching   Lipitor [Atorvastatin] Other (See Comments)    Damage to calf muscles    Oxycodone Itching   Statins Other (See Comments)    Muscle pain    Vytorin [Ezetimibe-Simvastatin] Other (See Comments)    Damage to calf muscles    Current Medications:   Current Outpatient Medications:    acetaminophen (TYLENOL) 325 MG tablet, Take 2 tablets (650 mg total) by mouth every 4 (four) hours as needed for mild pain (or temp > 37.5 C (99.5 F))., Disp:  , Rfl:    albuterol (VENTOLIN HFA) 108 (90 Base) MCG/ACT inhaler, Inhale 2 puffs into the lungs every 6 (six) hours as needed for wheezing or shortness of breath., Disp: 8 g, Rfl: 0   carvedilol (COREG) 3.125 MG tablet, TAKE 1 TABLET BY MOUTH DAILY, Disp: 90 tablet, Rfl: 3   cetirizine (ZYRTEC ALLERGY) 10 MG tablet, Take 10 mg by mouth daily., Disp: , Rfl:    cholecalciferol (VITAMIN D) 1000 units tablet, Take 1 tablet (1,000 Units total) by mouth daily., Disp: 30 tablet, Rfl: 0   clopidogrel (PLAVIX) 75 MG tablet, TAKE 1 TABLET BY MOUTH DAILY, Disp: 90 tablet, Rfl: 1   [employer] GAS RELIEF EXTRA STRENGTH 125 MG chewable tablet, Chew 125 mg by mouth as needed., Disp: , Rfl:    escitalopram (LEXAPRO) 20 MG tablet, Take 1 tablet (20 mg total) by mouth daily., Disp: 90 tablet, Rfl: 1   esomeprazole (NEXIUM) 40 MG capsule, TAKE 1 CAPSULE BY MOUTH DAILY AS NEEDED FOR HEARTBURN, Disp: 30 capsule, Rfl: 5   ezetimibe (ZETIA) 10 MG tablet, Take 1 tablet (10 mg total) by mouth daily., Disp: 90 tablet, Rfl: 3   furosemide (LASIX) 40 MG tablet, Take 0.5 tablets (20 mg total) by mouth 3 (three) times a week., Disp: 30 tablet, Rfl: 6   hydrOXYzine (ATARAX/VISTARIL) 10 MG tablet, Take 1 tablet (10 mg total) by mouth at bedtime as needed., Disp: 30 tablet, Rfl: 0   montelukast (SINGULAIR) 10 MG tablet, Take 1 tablet (10 mg total) by mouth at bedtime., Disp: 30 tablet, Rfl: 3   NEOMYCIN-POLYMYXIN-HYDROCORTISONE (CORTISPORIN) 1 % SOLN OTIC solution, INSTILL 3 DROPS INTO BOTH EARS EVERY 8 HOURS, Disp: 10 mL, Rfl: 0   olopatadine (PATANOL) 0.1 % ophthalmic  solution, INSTILL 1 DROP INTO BOTH EYS DAILY AS NEEDED FOR ALLERGIES, Disp: 5 mL, Rfl: 2   Omega-3 Fatty Acids (FISH OIL) 1000 MG CAPS, TAKE 1 CAPSULE (1,000 MG TOTAL) BY MOUTH EVERY MORNING., Disp: 120 capsule, Rfl: 0   ondansetron (ZOFRAN-ODT) 4 MG disintegrating tablet, TAKE 2 TABLETS BY MOUTH AS NEEDED, Disp: 45 tablet, Rfl: 3   rosuvastatin (CRESTOR) 20 MG tablet, Take 1 tablet (20 mg  total) by mouth daily., Disp: 90 tablet, Rfl: 3   sacubitril-valsartan (ENTRESTO) 24-26 MG, Take 1 tablet by mouth 2 (two) times daily., Disp: 180 tablet, Rfl: 3   traZODone (DESYREL) 50 MG tablet, Take 0.5-1 tablets (25-50 mg total) by mouth at bedtime as needed for sleep., Disp: 30 tablet, Rfl: 3   triamcinolone cream (KENALOG) 0.1 %, 1 application Externally once a day, mix w/ itch relief lotion for 30 days, Disp: , Rfl:    Review of Systems:   Review of Systems  Respiratory:  Positive for cough.    Negative unless otherwise specified per HPI.  Vitals:   Vitals:   10/27/22 1407  BP: 110/72  Pulse: (!) 109  Temp: 98.2 F (36.8 C)  TempSrc: Temporal  SpO2: 93%  Weight: 174 lb 6.1 oz (79.1 kg)  Height: _0  (1.676 m)     Body mass index is 28.15 kg/m.  Physical Exam:   Physical Exam Vitals and nursing note reviewed.  Constitutional:      General: He is not in acute distress.    Appearance: He is well-developed. He is not ill-appearing or toxic-appearing.  Cardiovascular:     Rate and Rhythm: Normal rate and regular rhythm.     Pulses: Normal pulses.     Heart sounds: Normal heart sounds, S1 normal and S2 normal.  Pulmonary:     Effort: Pulmonary effort is normal.     Breath sounds: Normal breath sounds.  Skin:    General: Skin is warm and dry.  Neurological:     Mental Status: He is alert.     GCS: GCS eye subscore is 4. GCS verbal subscore is 5. GCS motor subscore is 6.     Comments: Left side of face with obvious droop  Psychiatric:        Speech: Speech normal.         Behavior: Behavior normal. Behavior is cooperative.     Assessment and Plan:   Acute cough No red flags on exam Suspect viral cough Start benzonatate 100 mg BID prn Follow-up if new/worsening sx  GAD (generalized anxiety disorder); Sleep disturbance Well controlled Continue lexapro 20 mg daily and trazodone 25-50 mg prn Follow-up in 6 months, sooner if concerns  Gastroesophageal reflux disease with esophagitis without hemorrhage Well controlled Continue nexium 40 mg daily prn Follow-up in 6 months, sooner if concerns  Type 2 diabetes mellitus with complication, without long-term current use of insulin (H[facility]) Update A1c Goal of A1c is <7 given hx of CVA Likely add Metformin if needed Would be great SGLT-2 candidate but unable to afford Follow-up in 6 months, sooner if concerns  Seasonal allergies Continue singulair prn and recommend taking daily when having cough  Hx of CVA Recommend aggressive lipid manage so LDL <70 -- will update lipid panel today Consider increase of Crestor to 40 mg vs deferment to cardiology for additional therapies Maintain A1c <7 - update today Continue Plavix as prescribed Healthy lifestyle as able  CHF Most recent cardiology note reviewed BP well controlled today Mgmt per cardiology  Time spent with patient today was 60 minutes which consisted of chart review, discussing diagnosis, work up, treatment answering questions and documentation.  Inda Coke, PA-C

## 2022-10-28 LAB — CBC WITH DIFFERENTIAL/PLATELET
Basophils Absolute: 0.1 10*3/uL (ref 0.0–0.1)
Basophils Relative: 1.4 % (ref 0.0–3.0)
Eosinophils Absolute: 0.1 10*3/uL (ref 0.0–0.7)
Eosinophils Relative: 0.9 % (ref 0.0–5.0)
HCT: 45.6 % (ref 39.0–52.0)
Hemoglobin: 15 g/dL (ref 13.0–17.0)
Lymphocytes Relative: 9.8 % — ABNORMAL LOW (ref 12.0–46.0)
Lymphs Abs: 0.6 10*3/uL — ABNORMAL LOW (ref 0.7–4.0)
MCHC: 32.8 g/dL (ref 30.0–36.0)
MCV: 91.6 fl (ref 78.0–100.0)
Monocytes Absolute: 0.9 10*3/uL (ref 0.1–1.0)
Monocytes Relative: 13.4 % — ABNORMAL HIGH (ref 3.0–12.0)
Neutro Abs: 5 10*3/uL (ref 1.4–7.7)
Neutrophils Relative %: 74.5 % (ref 43.0–77.0)
Platelets: 196 10*3/uL (ref 150.0–400.0)
RBC: 4.98 Mil/uL (ref 4.22–5.81)
RDW: 14.7 % (ref 11.5–15.5)
WBC: 6.6 10*3/uL (ref 4.0–10.5)

## 2022-10-28 LAB — COMPREHENSIVE METABOLIC PANEL
ALT: 11 U/L (ref 0–53)
AST: 14 U/L (ref 0–37)
Albumin: 4.1 g/dL (ref 3.5–5.2)
Alkaline Phosphatase: 36 U/L — ABNORMAL LOW (ref 39–117)
BUN: 19 mg/dL (ref 6–23)
CO2: 30 mEq/L (ref 19–32)
Calcium: 8.4 mg/dL (ref 8.4–10.5)
Chloride: 97 mEq/L (ref 96–112)
Creatinine, Ser: 1.11 mg/dL (ref 0.40–1.50)
GFR: 61.9 mL/min (ref >60.00)
Glucose, Bld: 125 mg/dL — ABNORMAL HIGH (ref 70–99)
Potassium: 4.5 mEq/L (ref 3.5–5.1)
Sodium: 137 mEq/L (ref 135–145)
Total Bilirubin: 1.2 mg/dL (ref 0.2–1.2)
Total Protein: 6.3 g/dL (ref 6.0–8.3)

## 2022-10-28 LAB — LIPID PANEL
Cholesterol: 231 mg/dL — ABNORMAL HIGH (ref 0–200)
HDL: 48.8 mg/dL (ref >39.00)
LDL Cholesterol: 156 mg/dL — ABNORMAL HIGH (ref 0–99)
NonHDL: 181.99
Total CHOL/HDL Ratio: 5
Triglycerides: 129 mg/dL (ref 0.0–149.0)
VLDL: 25.8 mg/dL (ref 0.0–40.0)

## 2022-10-28 LAB — HEMOGLOBIN A1C: Hgb A1c MFr Bld: 7.6 % — ABNORMAL HIGH (ref 4.6–6.5)

## 2022-10-29 ENCOUNTER — Other Ambulatory Visit: Payer: Self-pay | Admitting: Physician Assistant

## 2022-10-29 MED ORDER — ROSUVASTATIN CALCIUM 40 MG PO TABS: 40.0000 mg | ORAL_TABLET | Freq: Every day | ORAL | 3 refills | Status: DC

## 2022-10-29 MED ORDER — METFORMIN HCL 500 MG PO TABS: 500.0000 mg | ORAL_TABLET | Freq: Every day | ORAL | 1 refills | Status: DC

## 2022-10-30 ENCOUNTER — Telehealth: Payer: Self-pay | Admitting: Physician Assistant

## 2022-10-30 NOTE — Telephone Encounter (Signed)
Caller states Patient's symptoms have not improved . Requests RX for Doxycyline (states the only antibiotic Patient can take) be sent to:   [facility], [place] Phone: [phone]  Fax: [phone]

## 2022-10-31 MED ORDER — DOXYCYCLINE HYCLATE 100 MG PO TABS: 100.0000 mg | ORAL_TABLET | Freq: Two times a day (BID) | ORAL | 0 refills | Status: DC

## 2022-10-31 NOTE — Telephone Encounter (Signed)
Tried to contact [place], [employer] box is not set up, unable to leave message. Rx for Doxy was sent to pharmacy.

## 2022-10-31 NOTE — Telephone Encounter (Signed)
Tried to contact again, no answer.

## 2022-11-03 ENCOUNTER — Encounter: Payer: Self-pay | Admitting: Physician Assistant

## 2022-11-03 NOTE — Telephone Encounter (Signed)
Letter sent to pt

## 2022-11-10 ENCOUNTER — Telehealth: Payer: Self-pay | Admitting: *Deleted

## 2022-11-10 NOTE — Telephone Encounter (Signed)
Pt's ex wife Christian [name] called told her thank you for calling. Christian [name] said due to history of stroke, his cholesterol needs to be better controlled, ideally LDL < 70. I have sent in increased dose of crestor 40 mg. A1c is also uncontrolled, start 500 mg metformin daily. Christian [name] said he has started both Rx's.  Follow-up in 3 months -- sooner if concerns. Christian [name] said he has an appt in July. Told her he will need to be seen in April. Christian [name] verbalized understanding and will call back and schedule appt. Asked her if he got the antibiotic that was sent in also. She said yes, he is doing much better. Told her okay.

## 2022-12-08 ENCOUNTER — Other Ambulatory Visit: Payer: Self-pay | Admitting: Cardiovascular Disease

## 2022-12-11 ENCOUNTER — Telehealth: Payer: Self-pay

## 2022-12-11 NOTE — Telephone Encounter (Signed)
**Note De-Identified  Obfuscation** The pts completed [employer] application for [employer] assistance was left at the office with his proof of income.  I have completed the providers page of his application and have e-mailed all to Dr Elmarie Shiley nurse so she can obtain his signature, date it, and to fax all to [employer] at the fax number written on the cover letter included.

## 2022-12-12 NOTE — Telephone Encounter (Signed)
Printed, signed, faxed, confirmation received.

## 2022-12-16 NOTE — Telephone Encounter (Signed)
PATIENT WAS APPROVALLED  FOR ENTERSTO UNTIL 10/20/23. PATIENT ID # [id number]

## 2022-12-31 ENCOUNTER — Telehealth: Payer: Self-pay | Admitting: Physician Assistant

## 2022-12-31 DIAGNOSIS — R110 Nausea: Secondary | ICD-10-CM

## 2022-12-31 MED ORDER — ONDANSETRON 4 MG PO TBDP: ORAL_TABLET | ORAL | 1 refills | Status: DC

## 2022-12-31 NOTE — Telephone Encounter (Signed)
  Encourage patient to contact the pharmacy for refills or they can request refills through [place]:  Please schedule appointment if longer than 1 year  NEXT APPOINTMENT DATE:  MEDICATION:  ondansetron (ZOFRAN-ODT) 4 MG disintegrating tablet   Is the patient out of medication? Almost  PHARMACY:  [facility], [place] Phone: [phone]  Fax: [phone]      Let patient know to contact pharmacy at the end of the day to make sure medication is ready.  Please notify patient to allow 48-72 hours to process

## 2022-12-31 NOTE — Telephone Encounter (Signed)
Tried to contact pt, voicemail not set up. Rx was sent to pharmacy as requested.

## 2023-01-18 ENCOUNTER — Encounter: Payer: Self-pay | Admitting: Cardiovascular Disease

## 2023-01-18 NOTE — Progress Notes (Signed)
This encounter was created in error - please disregard.

## 2023-01-19 ENCOUNTER — Ambulatory Visit: Payer: Medicare Other | Admitting: Cardiovascular Disease

## 2023-01-21 NOTE — Progress Notes (Shared)
Christian [name] is a 82 y.o. male here for a follow up of a pre-existing problem.  History of Present Illness:   No chief complaint on file.   HPI  Chronic systolic congestive heart failure Treated with Entresto 24-26 mg twice daily. Followed by cardiologist, Dr. Mertie Moores.  Hyperlipidemia    Past Medical History:  Diagnosis Date   Allergic rhinitis due to pollen [id number]   Chronic systolic heart failure ([place]) - EF improved to normal on Rx    Echo 11/18: Mild LVH, EF 20, diffuse HK mild AI, a sending aorta 42 mm (mildly dilated), mild MR, mild TR, trivial PI, PASP 44 // Echo 3/19: mild LVH, EF 25-30, diff HK, Gr 1 DD, asc aorta 43 mm (mildly dilated), MAC, mild reduced RVSF // Limited echo 10/19: Mild concentric LVH, EF [id number], grade 1 diastolic dysfunction, MAC, mild LAE    Coronary artery disease involving native coronary artery of native heart without angina pectoris 11/11/2017   R/L HC 11/18: pLAD 45, oD1 65 ; elevated PCWP (mean 29), moderate pulmonary hypertension, CO 3.66, CI 2.07   Depression with anxiety 07/08/2017   Essential hypertension 07/08/2017   GERD with esophagitis 01/11/2018   Hernia of abdominal cavity    Hyperlipidemia    Hyperlipidemia LDL goal <130 05/25/2017   The 10-year ASCVD risk score Mikey Bussing DC Jr., et al., 2013) is: 31.1%   Values used to calculate the score:     Age: 65 years     Sex: Male     Is Non-Hispanic African American: No     Diabetic: No     Tobacco smoker: No     Systolic Blood Pressure: [id number] mmHg     Is BP treated: No     HDL Cholesterol: 68.7 mg/dL     Total Cholesterol: 258 mg/dL    Idiopathic inflammatory myopathy 05/25/2017   Neuropathy involving both lower extremities 05/25/2017   Nonischemic cardiomyopathy ([place]) 11/11/2017   LHC 09/16/17-nonobstructive CAD   Oropharyngeal dysphagia 01/11/2018   Type 2 diabetes mellitus with complication, without long-term current use of insulin ([place]) 07/08/2017     Social History   Tobacco Use   Smoking  status: Never   Smokeless tobacco: Never  Vaping Use   Vaping Use: Never used  Substance Use Topics   Alcohol use: No   Drug use: No    Past Surgical History:  Procedure Laterality Date   BACK SURGERY     HERNIA REPAIR     RIGHT/LEFT HEART CATH AND CORONARY ANGIOGRAPHY N/A 09/16/2017   Procedure: RIGHT/LEFT HEART CATH AND CORONARY ANGIOGRAPHY;  Surgeon: Martinique, Peter M, MD;  Location: [facility];  Service: Cardiovascular;  Laterality: N/A;   SPINE SURGERY      Family History  Problem Relation Age of Onset   Stroke Mother    Kidney disease Father    Stroke Father    Hypertension Father    Early death Father    Cancer Neg Hx    Alcohol abuse Neg Hx    Diabetes Neg Hx    Heart disease Neg Hx    Hyperlipidemia Neg Hx     Allergies  Allergen Reactions   Amoxicillin Hives   Bactrim [Sulfamethoxazole-Trimethoprim] Other (See Comments)    Dried out his eyes   Cephalexin Itching   Hydrocodone Itching   Lipitor [Atorvastatin] Other (See Comments)    Damage to calf muscles   Oxycodone Itching   Statins Other (See Comments)    Muscle  pain    Vytorin [Ezetimibe-Simvastatin] Other (See Comments)    Damage to calf muscles    Current Medications:   Current Outpatient Medications:    acetaminophen (TYLENOL) 325 MG tablet, Take 2 tablets (650 mg total) by mouth every 4 (four) hours as needed for mild pain (or temp > 37.5 C (99.5 F))., Disp:  , Rfl:    albuterol (VENTOLIN HFA) 108 (90 Base) MCG/ACT inhaler, Inhale 2 puffs into the lungs every 6 (six) hours as needed for wheezing or shortness of breath., Disp: 8 g, Rfl: 0   benzonatate (TESSALON) 100 MG capsule, Take 1 capsule (100 mg total) by mouth 2 (two) times daily as needed for cough., Disp: 30 capsule, Rfl: 0   carvedilol (COREG) 3.125 MG tablet, TAKE 1 TABLET BY MOUTH DAILY, Disp: 90 tablet, Rfl: 3   cetirizine (ZYRTEC ALLERGY) 10 MG tablet, Take 10 mg by mouth daily., Disp: , Rfl:    cholecalciferol (VITAMIN D)  1000 units tablet, Take 1 tablet (1,000 Units total) by mouth daily., Disp: 30 tablet, Rfl: 0   clopidogrel (PLAVIX) 75 MG tablet, TAKE 1 TABLET BY MOUTH DAILY, Disp: 90 tablet, Rfl: 3   [employer] GAS RELIEF EXTRA STRENGTH 125 MG chewable tablet, Chew 125 mg by mouth as needed., Disp: , Rfl:    escitalopram (LEXAPRO) 20 MG tablet, Take 1 tablet (20 mg total) by mouth daily., Disp: 90 tablet, Rfl: 1   esomeprazole (NEXIUM) 40 MG capsule, TAKE 1 CAPSULE BY MOUTH DAILY AS NEEDED FOR HEARTBURN, Disp: 30 capsule, Rfl: 5   ezetimibe (ZETIA) 10 MG tablet, Take 1 tablet (10 mg total) by mouth daily., Disp: 90 tablet, Rfl: 3   furosemide (LASIX) 40 MG tablet, Take 0.5 tablets (20 mg total) by mouth 3 (three) times a week., Disp: 30 tablet, Rfl: 6   hydrOXYzine (ATARAX/VISTARIL) 10 MG tablet, Take 1 tablet (10 mg total) by mouth at bedtime as needed., Disp: 30 tablet, Rfl: 0   metFORMIN (GLUCOPHAGE) 500 MG tablet, Take 1 tablet (500 mg total) by mouth daily with breakfast., Disp: 90 tablet, Rfl: 1   montelukast (SINGULAIR) 10 MG tablet, Take 1 tablet (10 mg total) by mouth at bedtime., Disp: 30 tablet, Rfl: 3   NEOMYCIN-POLYMYXIN-HYDROCORTISONE (CORTISPORIN) 1 % SOLN OTIC solution, INSTILL 3 DROPS INTO BOTH EARS EVERY 8 HOURS, Disp: 10 mL, Rfl: 0   olopatadine (PATANOL) 0.1 % ophthalmic solution, INSTILL 1 DROP INTO BOTH EYS DAILY AS NEEDED FOR ALLERGIES, Disp: 5 mL, Rfl: 2   Omega-3 Fatty Acids (FISH OIL) 1000 MG CAPS, TAKE 1 CAPSULE (1,000 MG TOTAL) BY MOUTH EVERY MORNING., Disp: 120 capsule, Rfl: 0   ondansetron (ZOFRAN-ODT) 4 MG disintegrating tablet, TAKE 2 TABLETS BY MOUTH AS NEEDED, Disp: 45 tablet, Rfl: 1   rosuvastatin (CRESTOR) 40 MG tablet, Take 1 tablet (40 mg total) by mouth daily., Disp: 90 tablet, Rfl: 3   sacubitril-valsartan (ENTRESTO) 24-26 MG, Take 1 tablet by mouth 2 (two) times daily., Disp: 180 tablet, Rfl: 3   traZODone (DESYREL) 50 MG tablet, Take 0.5-1 tablets (25-50 mg total) by mouth at  bedtime as needed for sleep., Disp: 30 tablet, Rfl: 3   triamcinolone cream (KENALOG) 0.1 %, 1 application Externally once a day, mix w/ itch relief lotion for 30 days, Disp: , Rfl:    Review of Systems:   ROS  Vitals:   There were no vitals filed for this visit.   There is no height or weight on file to calculate BMI.  Physical Exam:   Physical Exam  Assessment and Plan:   There are no diagnoses linked to this encounter.   Inda Coke, PA-C

## 2023-01-22 ENCOUNTER — Ambulatory Visit (INDEPENDENT_AMBULATORY_CARE_PROVIDER_SITE_OTHER): Payer: Medicare Other | Admitting: Physician Assistant

## 2023-01-22 ENCOUNTER — Encounter: Payer: Self-pay | Admitting: Physician Assistant

## 2023-01-22 VITALS — BP 100/64 | HR 69 | Temp 97.8°F | Ht 66.0 in | Wt 160.5 lb

## 2023-01-22 DIAGNOSIS — R0602 Shortness of breath: Secondary | ICD-10-CM | POA: Diagnosis not present

## 2023-01-22 DIAGNOSIS — L0390 Cellulitis, unspecified: Secondary | ICD-10-CM

## 2023-01-22 DIAGNOSIS — F419 Anxiety disorder, unspecified: Secondary | ICD-10-CM | POA: Diagnosis not present

## 2023-01-22 LAB — POC URINALSYSI DIPSTICK (AUTOMATED)
Bilirubin, UA: 1
Blood, UA: NEGATIVE
Glucose, UA: NEGATIVE
Leukocytes, UA: NEGATIVE
Nitrite, UA: NEGATIVE
Protein, UA: POSITIVE — AB
Spec Grav, UA: 1.025 (ref 1.010–1.025)
Urobilinogen, UA: 1 E.U./dL
pH, UA: 6 (ref 5.0–8.0)

## 2023-01-22 LAB — COMPREHENSIVE METABOLIC PANEL
ALT: 12 U/L (ref 0–53)
AST: 15 U/L (ref 0–37)
Albumin: 4.1 g/dL (ref 3.5–5.2)
Alkaline Phosphatase: 34 U/L — ABNORMAL LOW (ref 39–117)
BUN: 20 mg/dL (ref 6–23)
CO2: 26 mEq/L (ref 19–32)
Calcium: 8.8 mg/dL (ref 8.4–10.5)
Chloride: 102 mEq/L (ref 96–112)
Creatinine, Ser: 0.99 mg/dL (ref 0.40–1.50)
GFR: 70.89 mL/min (ref >60.00)
Glucose, Bld: 156 mg/dL — ABNORMAL HIGH (ref 70–99)
Potassium: 4.2 mEq/L (ref 3.5–5.1)
Sodium: 140 mEq/L (ref 135–145)
Total Bilirubin: 1.3 mg/dL — ABNORMAL HIGH (ref 0.2–1.2)
Total Protein: 6.2 g/dL (ref 6.0–8.3)

## 2023-01-22 LAB — CBC WITH DIFFERENTIAL/PLATELET
Basophils Absolute: 0 10*3/uL (ref 0.0–0.1)
Basophils Relative: 0.5 % (ref 0.0–3.0)
Eosinophils Absolute: 0.1 10*3/uL (ref 0.0–0.7)
Eosinophils Relative: 1.3 % (ref 0.0–5.0)
HCT: 42.4 % (ref 39.0–52.0)
Hemoglobin: 14.1 g/dL (ref 13.0–17.0)
Lymphocytes Relative: 16.6 % (ref 12.0–46.0)
Lymphs Abs: 1.2 10*3/uL (ref 0.7–4.0)
MCHC: 33.3 g/dL (ref 30.0–36.0)
MCV: 88.9 fl (ref 78.0–100.0)
Monocytes Absolute: 0.5 10*3/uL (ref 0.1–1.0)
Monocytes Relative: 7.2 % (ref 3.0–12.0)
Neutro Abs: 5.4 10*3/uL (ref 1.4–7.7)
Neutrophils Relative %: 74.4 % (ref 43.0–77.0)
Platelets: 227 10*3/uL (ref 150.0–400.0)
RBC: 4.77 Mil/uL (ref 4.22–5.81)
RDW: 15.6 % — ABNORMAL HIGH (ref 11.5–15.5)
WBC: 7.3 10*3/uL (ref 4.0–10.5)

## 2023-01-22 LAB — TSH: TSH: 1.36 u[IU]/mL (ref 0.35–5.50)

## 2023-01-22 MED ORDER — DOXYCYCLINE HYCLATE 100 MG PO TABS: 100.0000 mg | ORAL_TABLET | Freq: Two times a day (BID) | ORAL | 0 refills | Status: DC

## 2023-01-22 MED ORDER — HYDROXYZINE HCL 10 MG PO TABS
10.0000 mg | ORAL_TABLET | Freq: Two times a day (BID) | ORAL | 0 refills | Status: DC | PRN
Start: 2023-01-22 — End: 2023-03-27

## 2023-01-22 NOTE — Progress Notes (Signed)
Christian [name] is a 82 y.o. male here for a follow up of a pre-existing problem.  History of Present Illness:   Chief Complaint  Patient presents with   Shortness of Breath    Pt is c/o SOB for several weeks.   Wound Check    Pt c/o wound on left upper outer thigh, has been going on for 5-6 months. Has been on antibiotics and seen dermatology.    Shortness of Breath  Wound Check    Right leg wound Wound has been there for 5-6 months  Seeing Dr Darrin [facility]pper [facility] Per patient's ex-wife, he is using Clobetasol and Triamcinolone daily on this daily He has been doing multiple rounds of doxyxcline He is not keeping area covered and is scratching it constantly Denies fevers/chill/purulent drainage  SOB Has been going on for several weeks Had appt with Dr Acie Fredrickson cardiology however patient could not make it to appt due to weakness and fatigue He did have to go one month without entresto recently due to affordability; he reports taking all of his other medications as prescribed He denies: cough, chest pain, LE swelling  Most recent echo on 02/2020 reviewed and visit with Nasher in April 2023 reviewed  Anxiety Has had ongoing anxiety per patient's caregiver Will have crying spells at times He is currently taking lexapro 20 mg daily Tolerating well Denies SI/HI    Past Medical History:  Diagnosis Date   Allergic rhinitis due to pollen [id number]   Chronic systolic heart failure ([place]) - EF improved to normal on Rx    Echo 11/18: Mild LVH, EF 20, diffuse HK mild AI, a sending aorta 42 mm (mildly dilated), mild MR, mild TR, trivial PI, PASP 44 // Echo 3/19: mild LVH, EF 25-30, diff HK, Gr 1 DD, asc aorta 43 mm (mildly dilated), MAC, mild reduced RVSF // Limited echo 10/19: Mild concentric LVH, EF [id number], grade 1 diastolic dysfunction, MAC, mild LAE    Coronary artery disease involving native coronary artery of native heart without angina pectoris 11/11/2017   R/L HC  11/18: pLAD 45, oD1 65 ; elevated PCWP (mean 29), moderate pulmonary hypertension, CO 3.66, CI 2.07   Depression with anxiety 07/08/2017   Essential hypertension 07/08/2017   GERD with esophagitis 01/11/2018   Hernia of abdominal cavity    Hyperlipidemia    Hyperlipidemia LDL goal <130 05/25/2017   The 10-year ASCVD risk score Mikey Bussing DC Jr., et al., 2013) is: 31.1%   Values used to calculate the score:     Age: 13 years     Sex: Male     Is Non-Hispanic African American: No     Diabetic: No     Tobacco smoker: No     Systolic Blood Pressure: XX123456 mmHg     Is BP treated: No     HDL Cholesterol: 68.7 mg/dL     Total Cholesterol: 258 mg/dL    Idiopathic inflammatory myopathy 05/25/2017   Neuropathy involving both lower extremities 05/25/2017   Nonischemic cardiomyopathy 11/11/2017   LHC 09/16/17-nonobstructive CAD   Oropharyngeal dysphagia 01/11/2018   Type 2 diabetes mellitus with complication, without long-term current use of insulin 07/08/2017     Social History   Tobacco Use   Smoking status: Never   Smokeless tobacco: Never  Vaping Use   Vaping Use: Never used  Substance Use Topics   Alcohol use: No   Drug use: No    Past Surgical History:  Procedure Laterality Date   BACK  SURGERY     HERNIA REPAIR     RIGHT/LEFT HEART CATH AND CORONARY ANGIOGRAPHY N/A 09/16/2017   Procedure: RIGHT/LEFT HEART CATH AND CORONARY ANGIOGRAPHY;  Surgeon: Martinique, Peter M, MD;  Location: [facility];  Service: Cardiovascular;  Laterality: N/A;   SPINE SURGERY      Family History  Problem Relation Age of Onset   Stroke Mother    Kidney disease Father    Stroke Father    Hypertension Father    Early death Father    Cancer Neg Hx    Alcohol abuse Neg Hx    Diabetes Neg Hx    Heart disease Neg Hx    Hyperlipidemia Neg Hx     Allergies  Allergen Reactions   Amoxicillin Hives   Bactrim [Sulfamethoxazole-Trimethoprim] Other (See Comments)    Dried out his eyes   Cephalexin Itching   Hydrocodone  Itching   Lipitor [Atorvastatin] Other (See Comments)    Damage to calf muscles   Oxycodone Itching   Statins Other (See Comments)    Muscle pain    Vytorin [Ezetimibe-Simvastatin] Other (See Comments)    Damage to calf muscles    Current Medications:   Current Outpatient Medications:    acetaminophen (TYLENOL) 325 MG tablet, Take 2 tablets (650 mg total) by mouth every 4 (four) hours as needed for mild pain (or temp > 37.5 C (99.5 F))., Disp:  , Rfl:    albuterol (VENTOLIN HFA) 108 (90 Base) MCG/ACT inhaler, Inhale 2 puffs into the lungs every 6 (six) hours as needed for wheezing or shortness of breath., Disp: 8 g, Rfl: 0   carvedilol (COREG) 3.125 MG tablet, TAKE 1 TABLET BY MOUTH DAILY, Disp: 90 tablet, Rfl: 3   cetirizine (ZYRTEC ALLERGY) 10 MG tablet, Take 10 mg by mouth daily., Disp: , Rfl:    cholecalciferol (VITAMIN D) 1000 units tablet, Take 1 tablet (1,000 Units total) by mouth daily., Disp: 30 tablet, Rfl: 0   clobetasol cream (TEMOVATE) [id number] %, Apply 1 Application topically 2 (two) times daily., Disp: , Rfl:    clopidogrel (PLAVIX) 75 MG tablet, TAKE 1 TABLET BY MOUTH DAILY, Disp: 90 tablet, Rfl: 3   [employer] GAS RELIEF EXTRA STRENGTH 125 MG chewable tablet, Chew 125 mg by mouth as needed., Disp: , Rfl:    escitalopram (LEXAPRO) 20 MG tablet, Take 1 tablet (20 mg total) by mouth daily., Disp: 90 tablet, Rfl: 1   esomeprazole (NEXIUM) 40 MG capsule, TAKE 1 CAPSULE BY MOUTH DAILY AS NEEDED FOR HEARTBURN, Disp: 30 capsule, Rfl: 5   ezetimibe (ZETIA) 10 MG tablet, Take 1 tablet (10 mg total) by mouth daily., Disp: 90 tablet, Rfl: 3   hydrOXYzine (ATARAX/VISTARIL) 10 MG tablet, Take 1 tablet (10 mg total) by mouth at bedtime as needed., Disp: 30 tablet, Rfl: 0   metFORMIN (GLUCOPHAGE) 500 MG tablet, Take 1 tablet (500 mg total) by mouth daily with breakfast., Disp: 90 tablet, Rfl: 1   montelukast (SINGULAIR) 10 MG tablet, Take 1 tablet (10 mg total) by mouth at bedtime., Disp: 30 tablet,  Rfl: 3   olopatadine (PATANOL) 0.1 % ophthalmic solution, INSTILL 1 DROP INTO BOTH EYS DAILY AS NEEDED FOR ALLERGIES, Disp: 5 mL, Rfl: 2   ondansetron (ZOFRAN-ODT) 4 MG disintegrating tablet, TAKE 2 TABLETS BY MOUTH AS NEEDED, Disp: 45 tablet, Rfl: 1   rosuvastatin (CRESTOR) 40 MG tablet, Take 1 tablet (40 mg total) by mouth daily., Disp: 90 tablet, Rfl: 3   sacubitril-valsartan (ENTRESTO)  24-26 MG, Take 1 tablet by mouth 2 (two) times daily., Disp: 180 tablet, Rfl: 3   traZODone (DESYREL) 50 MG tablet, Take 0.5-1 tablets (25-50 mg total) by mouth at bedtime as needed for sleep., Disp: 30 tablet, Rfl: 3   triamcinolone cream (KENALOG) 0.1 %, 1 application Externally once a day, mix w/ itch relief lotion for 30 days, Disp: , Rfl:    Review of Systems:   Review of Systems  Respiratory:  Positive for shortness of breath.    Negative unless otherwise specified per HPI.  Vitals:   Vitals:   01/22/23 1041  BP: 100/64  Pulse: 69  Temp: 97.8 F (36.6 C)  TempSrc: Temporal  SpO2: 96%  Weight: 160 lb 8 oz (72.8 kg)  Height: 5\' 6"  (1.676 m)     Body mass index is 25.91 kg/m.  Physical Exam:   Physical Exam Vitals and nursing note reviewed.  Constitutional:      General: He is not in acute distress.    Appearance: He is well-developed. He is not ill-appearing or toxic-appearing.  Cardiovascular:     Rate and Rhythm: Normal rate and regular rhythm.     Pulses: Normal pulses.     Heart sounds: Normal heart sounds, S1 normal and S2 normal.  Pulmonary:     Effort: Pulmonary effort is normal.     Breath sounds: Normal breath sounds.  Skin:    General: Skin is warm and dry.     Comments: Multiple scattered wounds on body - mostly on upper back/shoulders and b/l legs R posterior thigh with significant wound approx 8 cm in length with surrounding erythema and slight crusting  Neurological:     Mental Status: He is alert.     GCS: GCS eye subscore is 4. GCS verbal subscore is 5. GCS  motor subscore is 6.  Psychiatric:        Speech: Speech normal.        Behavior: Behavior normal. Behavior is cooperative.      Assessment and Plan:   SOB (shortness of breath) EKG tracing is personally reviewed.  EKG notes NSR.  No acute changes compared to prior EKG. Will obtain blood work, UA and labs Discussed need to get back into cardiology ASAP  If any worsening sx, needs to go to the ER  Wound cellulitis Suspect poor healing due to noncompliance with wound care Home wound care ordered Wound was dressed in office today with xeroform and appropriate bandages Hold steroids Hydroxyzine 10 mg prn for severe itching Follow-up with [place] or derm for further care Restart doxycycline  Anxiety Ongoing per ex-wife Continue lexapro 20 mg daily Add prn hydroxyzine for worsening sx Follow-up with [place] in 1-3 months, sooner if concerns  I spent a total of 62 minutes on this visit, today 01/22/23, which included reviewing previous echo on 02/2020 reviewed and visit with Nasher in April 2023, ordering tests, discussing plan of care with patient and using shared-decision making on next steps, wound care, refilling medications, and documenting the findings in the note.      Inda Coke, PA-C

## 2023-01-22 NOTE — Patient Instructions (Addendum)
It was great to see you!  We will get blood work, urine test and chest xray today  Start doxycycline antibiotic  Use hydroxyzine 10 mg for any severe itching or anxiety  We will place order for home health wound care  An order for xray has been put in for you. To have this done, you can walk in at the [facility] location without a scheduled appointment.  The address is [address] It is across the street from [facility]. Lab and x-xray are located in the basement.   Hours of operation are M-F 8:30am to 5:00pm.  Please note that they are closed for lunch between 12:30 and 1:00pm.   PLEASE follow-up with Dr Acie Fredrickson if new/worsening symptoms    Take care,  Inda Coke PA-C

## 2023-01-28 ENCOUNTER — Ambulatory Visit: Payer: Medicare Other | Admitting: Physician Assistant

## 2023-01-30 ENCOUNTER — Telehealth: Payer: Self-pay | Admitting: *Deleted

## 2023-01-30 NOTE — Telephone Encounter (Signed)
Spoke to pt told him calling back with number for Home health. Pt said he can not take message have to talk to [name]. Told him did try but unable to leave message. He said hang on will get someone to help. Friend of pt's came on the phone [name]. I gave her information for [facility] and phone number told her need to call to schedule an appt. [name] said she will give info to [place].

## 2023-02-03 ENCOUNTER — Ambulatory Visit: Payer: Medicare Other | Admitting: Physician Assistant

## 2023-02-05 ENCOUNTER — Telehealth: Payer: Self-pay | Admitting: Physician Assistant

## 2023-02-05 DIAGNOSIS — J302 Other seasonal allergic rhinitis: Secondary | ICD-10-CM

## 2023-02-05 NOTE — Telephone Encounter (Signed)
Please see message and advise 

## 2023-02-05 NOTE — Telephone Encounter (Signed)
April  with [facility] [phone] asking for the following orders:   1) nursing 1 x a wk for 9 wks for wound care   2) Med list - not taking zyrtec or singulair  3) Carvedilol - has not been taking it - does have it and/but need refills.  4) Drug interactions.- plavix with nexium  Hydroxyzine with zyloprim Escitalopram with zofran

## 2023-02-06 NOTE — Telephone Encounter (Signed)
Spoke to April with [facility], verbal orders given for Nursing 1 x a wk for 9 wks for wound care okay per [facility] April verbalized understanding. I will discontinued zyrtec and Singulair from med list.  In regards to Carvedilol that is prescribed by Cardiology they will need to contact Dr. Melburn Popper. April verbalized understanding. Also told her Lelon Mast is aware of the drug interactions, let patient know to limit nexium as much as able while on plavix. April verbalized understanding and will let him know.

## 2023-02-16 NOTE — Progress Notes (Signed)
Christian [name] is a 82 y.o. male here for a {New prob or follow up:31724}.  History of Present Illness:   No chief complaint on file.   HPI  Shortness of breath:     Past Medical History:  Diagnosis Date   Allergic rhinitis due to pollen 03/29/2018   Chronic systolic heart failure ([facility]) - EF improved to normal on Rx    Echo 11/18: Mild LVH, EF 20, diffuse HK mild AI, a sending aorta 42 mm (mildly dilated), mild MR, mild TR, trivial PI, PASP 44 // Echo 3/19: mild LVH, EF 25-30, diff HK, Gr 1 DD, asc aorta 43 mm (mildly dilated), MAC, mild reduced RVSF // Limited echo 10/19: Mild concentric LVH, EF 60-65, grade 1 diastolic dysfunction, MAC, mild LAE    Coronary artery disease involving native coronary artery of native heart without angina pectoris 11/11/2017   R/L HC 11/18: pLAD 45, oD1 65 ; elevated PCWP (mean 29), moderate pulmonary hypertension, CO 3.66, CI 2.07   Depression with anxiety 07/08/2017   Essential hypertension 07/08/2017   GERD with esophagitis 01/11/2018   Hernia of abdominal cavity    Hyperlipidemia    Hyperlipidemia LDL goal <130 05/25/2017   The 10-year ASCVD risk score Denman George DC Jr., et al., 2013) is: 31.1%   Values used to calculate the score:     Age: 61 years     Sex: Male     Is Non-Hispanic African American: No     Diabetic: No     Tobacco smoker: No     Systolic Blood Pressure: 140 mmHg     Is BP treated: No     HDL Cholesterol: 68.7 mg/dL     Total Cholesterol: 258 mg/dL    Idiopathic inflammatory myopathy 05/25/2017   Neuropathy involving both lower extremities 05/25/2017   Nonischemic cardiomyopathy (HCC) 11/11/2017   LHC 09/16/17-nonobstructive CAD   Oropharyngeal dysphagia 01/11/2018   Type 2 diabetes mellitus with complication, without long-term current use of insulin (HCC) 07/08/2017     Social History   Tobacco Use   Smoking status: Never   Smokeless tobacco: Never  Vaping Use   Vaping Use: Never used  Substance Use Topics   Alcohol use: No   Drug use:  No    Past Surgical History:  Procedure Laterality Date   BACK SURGERY     HERNIA REPAIR     RIGHT/LEFT HEART CATH AND CORONARY ANGIOGRAPHY N/A 09/16/2017   Procedure: RIGHT/LEFT HEART CATH AND CORONARY ANGIOGRAPHY;  Surgeon: [place], Peter M, MD;  Location: [facility];  Service: Cardiovascular;  Laterality: N/A;   SPINE SURGERY      Family History  Problem Relation Age of Onset   Stroke Mother    Kidney disease Father    Stroke Father    Hypertension Father    Early death Father    Cancer Neg Hx    Alcohol abuse Neg Hx    Diabetes Neg Hx    Heart disease Neg Hx    Hyperlipidemia Neg Hx     Allergies  Allergen Reactions   Amoxicillin Hives   Bactrim [Sulfamethoxazole-Trimethoprim] Other (See Comments)    Dried out his eyes   Cephalexin Itching   Hydrocodone Itching   Lipitor [Atorvastatin] Other (See Comments)    Damage to calf muscles   Oxycodone Itching   Statins Other (See Comments)    Muscle pain    Vytorin [Ezetimibe-Simvastatin] Other (See Comments)    Damage to calf muscles  Current Medications:   Current Outpatient Medications:    acetaminophen (TYLENOL) 325 MG tablet, Take 2 tablets (650 mg total) by mouth every 4 (four) hours as needed for mild pain (or temp > 37.5 C (99.5 F))., Disp:  , Rfl:    albuterol (VENTOLIN HFA) 108 (90 Base) MCG/ACT inhaler, Inhale 2 puffs into the lungs every 6 (six) hours as needed for wheezing or shortness of breath., Disp: 8 g, Rfl: 0   carvedilol (COREG) 3.125 MG tablet, TAKE 1 TABLET BY MOUTH DAILY, Disp: 90 tablet, Rfl: 3   cholecalciferol (VITAMIN D) 1000 units tablet, Take 1 tablet (1,000 Units total) by mouth daily., Disp: 30 tablet, Rfl: 0   clobetasol cream (TEMOVATE) 0.05 %, Apply 1 Application topically 2 (two) times daily., Disp: , Rfl:    clopidogrel (PLAVIX) 75 MG tablet, TAKE 1 TABLET BY MOUTH DAILY, Disp: 90 tablet, Rfl: 3   [employer]S GAS RELIEF EXTRA STRENGTH 125 MG chewable tablet, Chew 125 mg by mouth as  needed., Disp: , Rfl:    doxycycline (VIBRA-TABS) 100 MG tablet, Take 1 tablet (100 mg total) by mouth 2 (two) times daily., Disp: 60 tablet, Rfl: 0   escitalopram (LEXAPRO) 20 MG tablet, Take 1 tablet (20 mg total) by mouth daily., Disp: 90 tablet, Rfl: 1   esomeprazole (NEXIUM) 40 MG capsule, TAKE 1 CAPSULE BY MOUTH DAILY AS NEEDED FOR HEARTBURN, Disp: 30 capsule, Rfl: 5   ezetimibe (ZETIA) 10 MG tablet, Take 1 tablet (10 mg total) by mouth daily., Disp: 90 tablet, Rfl: 3   hydrOXYzine (ATARAX) 10 MG tablet, Take 1 tablet (10 mg total) by mouth 2 (two) times daily as needed for itching or anxiety., Disp: 30 tablet, Rfl: 0   metFORMIN (GLUCOPHAGE) 500 MG tablet, Take 1 tablet (500 mg total) by mouth daily with breakfast., Disp: 90 tablet, Rfl: 1   olopatadine (PATANOL) 0.1 % ophthalmic solution, INSTILL 1 DROP INTO BOTH EYS DAILY AS NEEDED FOR ALLERGIES, Disp: 5 mL, Rfl: 2   ondansetron (ZOFRAN-ODT) 4 MG disintegrating tablet, TAKE 2 TABLETS BY MOUTH AS NEEDED, Disp: 45 tablet, Rfl: 1   rosuvastatin (CRESTOR) 40 MG tablet, Take 1 tablet (40 mg total) by mouth daily., Disp: 90 tablet, Rfl: 3   sacubitril-valsartan (ENTRESTO) 24-26 MG, Take 1 tablet by mouth 2 (two) times daily., Disp: 180 tablet, Rfl: 3   traZODone (DESYREL) 50 MG tablet, Take 0.5-1 tablets (25-50 mg total) by mouth at bedtime as needed for sleep., Disp: 30 tablet, Rfl: 3   triamcinolone cream (KENALOG) 0.1 %, 1 application Externally once a day, mix w/ itch relief lotion for 30 days, Disp: , Rfl:    Review of Systems:   ROS  Vitals:   There were no vitals filed for this visit.   There is no height or weight on file to calculate BMI.  Physical Exam:   Physical Exam  Assessment and Plan:   There are no diagnoses linked to this encounter.  I,Rachel Rivera,acting as a Neurosurgeon for [employer], PA.,have documented all relevant documentation on the behalf of [name], PA,as directed by  [name], PA while in  the presence of [name], [place].  ***  [name], PA-C

## 2023-02-17 ENCOUNTER — Encounter: Payer: Self-pay | Admitting: Physician Assistant

## 2023-02-17 ENCOUNTER — Ambulatory Visit (INDEPENDENT_AMBULATORY_CARE_PROVIDER_SITE_OTHER): Payer: Medicare Other | Admitting: Physician Assistant

## 2023-02-17 ENCOUNTER — Telehealth: Payer: Self-pay | Admitting: Cardiovascular Disease

## 2023-02-17 VITALS — BP 110/70 | HR 56 | Temp 97.3°F | Ht 66.0 in | Wt 160.0 lb

## 2023-02-17 DIAGNOSIS — G3184 Mild cognitive impairment, so stated: Secondary | ICD-10-CM

## 2023-02-17 DIAGNOSIS — R0602 Shortness of breath: Secondary | ICD-10-CM

## 2023-02-17 DIAGNOSIS — L0390 Cellulitis, unspecified: Secondary | ICD-10-CM | POA: Diagnosis not present

## 2023-02-17 DIAGNOSIS — I69311 Memory deficit following cerebral infarction: Secondary | ICD-10-CM

## 2023-02-17 DIAGNOSIS — I63531 Cerebral infarction due to unspecified occlusion or stenosis of right posterior cerebral artery: Secondary | ICD-10-CM

## 2023-02-17 NOTE — Patient Instructions (Addendum)
It was great to see you!  I have called cardiology and they are going to try to see you sooner, I've explained the situation to them.  Consider buying a pulse oximeter to assess your breathing when you feel short of breath. If readings are consistently <88% please seek care at the ER.  I'm going to have you re-connect with your neurologist to assess your memory and check in on your brain  [facility] [address] [place], [place] [address]  Let's plan to have the neurologist determine if you need referral for neuropsychiatrist -- I will defer this referral for the time being.  I'm placing urgent referral to [facility] to have you evaluated for your shortness of breath  If any worsening of symptoms in the meantime, please go to the ER  Take care,  [name] PA-C

## 2023-02-17 NOTE — Telephone Encounter (Signed)
Per patient's PA he has been having increased shortness of breath and fatigue. He needs to be seen sooner than 5/30.  Spoke with the patient's wife and have scheduled patient for an appointment with APP on Monday 5/6. Advised to call back with any worsening symptoms.

## 2023-02-17 NOTE — Telephone Encounter (Signed)
Pt c/o Shortness Of Breath: STAT if SOB developed within the last 24 hours or pt is noticeably SOB on the phone  1. Are you currently SOB (can you hear that pt is SOB on the phone)? Yes (per patient's PCP)   2. How long have you been experiencing SOB? 2-4 weeks  3. Are you SOB when sitting or when up moving around? Moving around.   4. Are you currently experiencing any other symptoms? Fatigue.    Patient's PCP is calling to see about getting this patient as soon appt. Requesting call back to patient to schedule.

## 2023-02-23 ENCOUNTER — Encounter: Payer: Self-pay | Admitting: Physician Assistant

## 2023-02-23 ENCOUNTER — Ambulatory Visit: Payer: Medicare Other | Attending: Physician Assistant | Admitting: Physician Assistant

## 2023-02-23 VITALS — BP 98/68 | HR 88 | Ht 66.0 in | Wt 156.6 lb

## 2023-02-23 DIAGNOSIS — I2510 Atherosclerotic heart disease of native coronary artery without angina pectoris: Secondary | ICD-10-CM

## 2023-02-23 DIAGNOSIS — I5022 Chronic systolic (congestive) heart failure: Secondary | ICD-10-CM

## 2023-02-23 DIAGNOSIS — I639 Cerebral infarction, unspecified: Secondary | ICD-10-CM | POA: Diagnosis not present

## 2023-02-23 DIAGNOSIS — E785 Hyperlipidemia, unspecified: Secondary | ICD-10-CM

## 2023-02-23 DIAGNOSIS — R0602 Shortness of breath: Secondary | ICD-10-CM

## 2023-02-23 NOTE — Patient Instructions (Signed)
Medication Instructions:  1.Stop Entresto *If you need a refill on your cardiac medications before your next appointment, please call your pharmacy*  Lab Work: None ordered If you have labs (blood work) drawn today and your tests are completely normal, you will receive your results only by: MyChart Message (if you have MyChart) OR A paper copy in the mail If you have any lab test that is abnormal or we need to change your treatment, we will call you to review the results.  Testing/Procedures: Your physician has requested that you have a STAT echocardiogram. Echocardiography is a painless test that uses sound waves to create images of your heart. It provides your doctor with information about the size and shape of your heart and how well your heart's chambers and valves are working. This procedure takes approximately one hour. There are no restrictions for this procedure. Please do NOT wear cologne, perfume, aftershave, or lotions (deodorant is allowed). Please arrive 15 minutes prior to your appointment time.    Follow-Up: At [facility], you and your health needs are our priority.  As part of our continuing mission to provide you with exceptional heart care, we have created designated Provider Care Teams.  These Care Teams include your primary Cardiologist (physician) and Advanced Practice Providers (APPs -  Physician Assistants and Nurse Practitioners) who all work together to provide you with the care you need, when you need it.  Your next appointment:   As already scheduled with Dr Elease Hashimoto  Other Instructions You have been referred to pulmonology-Dr Byrum.

## 2023-02-23 NOTE — Progress Notes (Signed)
Office Visit    Patient Name: Christian [name] Date of Encounter: 02/23/2023  PCP:  Christian Motto, PA   [facility]  Cardiologist:  Christian Miss, MD  Advanced Practice Provider:  Beatrice Lecher, PA-C Electrophysiologist:  None   HPI    Christian [name] is a 82 y.o. male with a past medical history of hypertension, hyperlipidemia, diabetes mellitus, chronic systolic congestive heart failure (EF 20% in November 2018) with normal coronaries, myopathy (due to statins) presents today for follow-up appointment.  Dr. Elease [name] first met him in the hospital November 2018.  Was found to have CHF with EF 20%.  No chest pain.  Took statins in the remote past and had prolonged muscle pains.  Update echocardiogram October 2019 with improved ventricular function.  He was seen December 24, 2020 and at that time was on Entresto and ejection fraction normalized.  He was feeling great.  He did have a stroke in May but recovered well and was on Plavix.  He was last seen January 22, 2022 for follow-up and LV function was improved.  He ran out of his Sherryll Burger so refill was ordered.  Working out regularly.  Today, he is worried about his blood pressure.  Is here with a family member who did a lot of the talking for him.  He has had a lot of shortness of breath over the last couple of months.  Also endorses extreme fatigue.  He does have a blood pressure cuff where he can monitor his blood pressure at home.  We discussed stopping his Sherryll Burger today and seeing if this helps with his symptoms.  Also, we will need to update an echocardiogram.  Reports no chest pain, pressure, or tightness. No edema, orthopnea, PND. Reports no palpitations.    Past Medical History    Past Medical History:  Diagnosis Date   Allergic rhinitis due to pollen 03/29/2018   Chronic systolic heart failure (HCC) - EF improved to normal on Rx    Echo 11/18: Mild LVH, EF 20, diffuse HK mild AI, a sending aorta 42 mm (mildly  dilated), mild MR, mild TR, trivial PI, PASP 44 // Echo 3/19: mild LVH, EF 25-30, diff HK, Gr 1 DD, asc aorta 43 mm (mildly dilated), MAC, mild reduced RVSF // Limited echo 10/19: Mild concentric LVH, EF 60-65, grade 1 diastolic dysfunction, MAC, mild LAE    Coronary artery disease involving native coronary artery of native heart without angina pectoris 11/11/2017   R/L HC 11/18: pLAD 45, oD1 65 ; elevated PCWP (mean 29), moderate pulmonary hypertension, CO 3.66, CI 2.07   Depression with anxiety 07/08/2017   Essential hypertension 07/08/2017   GERD with esophagitis 01/11/2018   Hernia of abdominal cavity    Hyperlipidemia    Hyperlipidemia LDL goal <130 05/25/2017   The 10-year ASCVD risk score Christian Christian [name] DC Jr., et al., 2013) is: 31.1%   Values used to calculate the score:     Age: 64 years     Sex: Male     Is Non-Hispanic African American: No     Diabetic: No     Tobacco smoker: No     Systolic Blood Pressure: 140 mmHg     Is BP treated: No     HDL Cholesterol: 68.7 mg/dL     Total Cholesterol: 258 mg/dL    Idiopathic inflammatory myopathy 05/25/2017   Neuropathy involving both lower extremities 05/25/2017   Nonischemic cardiomyopathy (HCC) 11/11/2017   LHC 09/16/17-nonobstructive CAD  Oropharyngeal dysphagia 01/11/2018   Type 2 diabetes mellitus with complication, without long-term current use of insulin (HCC) 07/08/2017   Past Surgical History:  Procedure Laterality Date   BACK SURGERY     HERNIA REPAIR     RIGHT/LEFT HEART CATH AND CORONARY ANGIOGRAPHY N/A 09/16/2017   Procedure: RIGHT/LEFT HEART CATH AND CORONARY ANGIOGRAPHY;  Surgeon: [place], Peter M, MD;  Location: [facility] INVASIVE CV LAB;  Service: Cardiovascular;  Laterality: N/A;   SPINE SURGERY      Allergies  Allergies  Allergen Reactions   Amoxicillin Hives   Bactrim [Sulfamethoxazole-Trimethoprim] Other (See Comments)    Dried out his eyes   Cephalexin Itching   Hydrocodone Itching   Lipitor [Atorvastatin] Other (See Comments)     Damage to calf muscles   Oxycodone Itching   Statins Other (See Comments)    Muscle pain    Vytorin [Ezetimibe-Simvastatin] Other (See Comments)    Damage to calf muscles    EKGs/Labs/Other Studies Reviewed:   The following studies were reviewed today: Cardiac Studies & Procedures   CARDIAC CATHETERIZATION  CARDIAC CATHETERIZATION 09/16/2017  Narrative  Prox LAD lesion is 45% stenosed.  1st Diag lesion is 65% stenosed.  Hemodynamic findings consistent with moderate pulmonary hypertension.  1. Nonobstructive CAD 2. Elevated LV filling pressures with PCWP mean of 29 mm Hg 3. Moderate pulmonary HTN 4. Cardiac output 3.66 L/min with index of 2.07.  Plan: optimize medical therapy for CHF.  Findings Coronary Findings Diagnostic  Dominance: Right  Left Anterior Descending Vessel was injected. Vessel is normal in caliber. The vessel exhibits minimal luminal irregularities. Prox LAD lesion is 45% stenosed.  First Diagonal Branch Ost 1st Diag to 1st Diag lesion is 65% stenosed.  Ramus Intermedius Vessel was injected. Vessel is normal in caliber. Vessel is angiographically normal.  Left Circumflex Vessel was injected. Vessel is normal in caliber. Vessel is angiographically normal.  Right Coronary Artery Vessel was injected. Vessel is large. Vessel is angiographically normal.  Intervention  No interventions have been documented.     ECHOCARDIOGRAM  ECHOCARDIOGRAM COMPLETE 02/26/2020  Narrative ECHOCARDIOGRAM REPORT    Patient Name:   [name] Date of Exam: 02/26/2020 Medical Rec #:  [id number]     Height:       66.0 in Accession #:    [id number]    Weight:       165.0 lb Date of Birth:  21-Aug-1941     BSA:          1.843 [name] Patient Age:    32 years      BP:           102/68 mmHg Patient Gender: [name]             HR:           91 bpm. Exam Location:  Inpatient  Procedure: 2D Echo  Indications:    CVA  History:        Patient has prior history of  Echocardiogram examinations, most recent 08/04/2018. CAD; Risk Factors:Hypertension, Diabetes and Dyslipidemia.  Sonographer:    Delcie Roch Referring Phys: [phone] BRIAN MILLER   Sonographer Comments: Technically difficult study due to poor echo windows. IMPRESSIONS   1. Left ventricular ejection fraction, by estimation, is 60 to 65%. The left ventricle has normal function. The left ventricle has no regional wall motion abnormalities. Left ventricular diastolic function could not be evaluated. 2. Right ventricular systolic function is normal. The right ventricular size is normal. 3. The mitral  valve is grossly normal. No evidence of mitral valve regurgitation. No evidence of mitral stenosis. 4. The aortic valve is tricuspid. Aortic valve regurgitation is not visualized. Mild aortic valve sclerosis is present, with no evidence of aortic valve stenosis. 5. Aortic dilatation noted. There is moderate dilatation of the ascending aorta measuring 42 mm.  FINDINGS Left Ventricle: Left ventricular ejection fraction, by estimation, is 60 to 65%. The left ventricle has normal function. The left ventricle has no regional wall motion abnormalities. The left ventricular internal cavity size was normal in size. There is no left ventricular hypertrophy. Left ventricular diastolic function could not be evaluated.  Right Ventricle: The right ventricular size is normal. No increase in right ventricular wall thickness. Right ventricular systolic function is normal.  Left Atrium: Left atrial size was normal in size.  Right Atrium: Right atrial size was not well visualized.  Pericardium: Trivial pericardial effusion is present.  Mitral Valve: The mitral valve is grossly normal. No evidence of mitral valve regurgitation. No evidence of mitral valve stenosis.  Tricuspid Valve: The tricuspid valve is normal in structure. Tricuspid valve regurgitation is trivial. No evidence of tricuspid  stenosis.  Aortic Valve: The aortic valve is tricuspid. . There is mild thickening and mild calcification of the aortic valve. Aortic valve regurgitation is not visualized. Mild aortic valve sclerosis is present, with no evidence of aortic valve stenosis. There is mild thickening of the aortic valve. There is mild calcification of the aortic valve.  Pulmonic Valve: The pulmonic valve was grossly normal. Pulmonic valve regurgitation is trivial. No evidence of pulmonic stenosis.  Aorta: Aortic dilatation noted. There is moderate dilatation of the ascending aorta measuring 42 mm.  IAS/Shunts: The atrial septum is grossly normal.   LEFT VENTRICLE PLAX 2D LVIDd:         4.10 cm LVIDs:         2.70 cm LV PW:         1.20 cm LV IVS:        1.10 cm LVOT diam:     2.10 cm LV SV:         39 LV SV Index:   21 LVOT Area:     3.46 cm   LEFT ATRIUM           Index LA diam:      4.20 cm 2.28 cm/[name] LA Vol (A4C): 43.9 ml 23.82 ml/[name] AORTIC VALVE LVOT Vmax:   61.80 cm/s LVOT Vmean:  39.800 cm/s LVOT VTI:    0.112 [name]  AORTA Ao Root diam: 3.60 cm Ao Asc diam:  4.20 cm  MV E velocity: 79.50 cm/s MV A velocity: 125.00 cm/s  SHUNTS MV E/A ratio:  0.64         Systemic VTI:  0.11 [name] Systemic Diam: 2.10 cm  Christian Miss MD Electronically signed by Christian Miss MD Signature Date/Time: 02/26/2020/12:54:14 PM    Final              EKG:  EKG is not ordered today.    Recent Labs: 01/22/2023: ALT 12; BUN 20; Creatinine, Ser 0.99; Hemoglobin 14.1; Platelets 227.0; Potassium 4.2; Sodium 140; TSH 1.36  Recent Lipid Panel    Component Value Date/Time   CHOL 231 (H) 10/27/2022 1454   CHOL 146 07/23/2021 1140   TRIG 129.0 10/27/2022 1454   HDL 48.80 10/27/2022 1454   HDL 42 07/23/2021 1140   CHOLHDL 5 10/27/2022 1454   VLDL 25.8 10/27/2022 1454   LDLCALC 156 (  H) 10/27/2022 1454   LDLCALC 77 07/23/2021 1140    Home Medications   Current Meds  Medication Sig   acetaminophen (TYLENOL)  325 MG tablet Take 2 tablets (650 mg total) by mouth every 4 (four) hours as needed for mild pain (or temp > 37.5 C (99.5 F)).   albuterol (VENTOLIN HFA) 108 (90 Base) MCG/ACT inhaler Inhale 2 puffs into the lungs every 6 (six) hours as needed for wheezing or shortness of breath.   carvedilol (COREG) 3.125 MG tablet TAKE 1 TABLET BY MOUTH DAILY   cholecalciferol (VITAMIN D) 1000 units tablet Take 1 tablet (1,000 Units total) by mouth daily.   clopidogrel (PLAVIX) 75 MG tablet TAKE 1 TABLET BY MOUTH DAILY   [employer] GAS RELIEF EXTRA STRENGTH 125 MG chewable tablet Chew 125 mg by mouth as needed.   escitalopram (LEXAPRO) 20 MG tablet Take 1 tablet (20 mg total) by mouth daily.   esomeprazole (NEXIUM) 40 MG capsule TAKE 1 CAPSULE BY MOUTH DAILY AS NEEDED FOR HEARTBURN   ezetimibe (ZETIA) 10 MG tablet Take 1 tablet (10 mg total) by mouth daily.   hydrOXYzine (ATARAX) 10 MG tablet Take 1 tablet (10 mg total) by mouth 2 (two) times daily as needed for itching or anxiety.   metFORMIN (GLUCOPHAGE) 500 MG tablet Take 1 tablet (500 mg total) by mouth daily with breakfast.   olopatadine (PATANOL) 0.1 % ophthalmic solution INSTILL 1 DROP INTO BOTH EYS DAILY AS NEEDED FOR ALLERGIES   ondansetron (ZOFRAN-ODT) 4 MG disintegrating tablet TAKE 2 TABLETS BY MOUTH AS NEEDED   rosuvastatin (CRESTOR) 40 MG tablet Take 1 tablet (40 mg total) by mouth daily.   traZODone (DESYREL) 50 MG tablet Take 0.5-1 tablets (25-50 mg total) by mouth at bedtime as needed for sleep.   triamcinolone cream (KENALOG) 0.1 % 1 application Externally once a day, mix w/ itch relief lotion for 30 days   [DISCONTINUED] sacubitril-valsartan (ENTRESTO) 24-26 MG Take 1 tablet by mouth 2 (two) times daily.     Review of Systems      All other systems reviewed and are otherwise negative except as noted above.  Physical Exam    VS:  BP 98/68   Pulse 88   Ht 5\' 6"  (1.676 [name])   Wt 156 lb 9.6 oz (71 kg)   SpO2 97%   BMI 25.28 kg/[name]  , BMI Body  mass index is 25.28 kg/[name].  Wt Readings from Last 3 Encounters:  02/23/23 156 lb 9.6 oz (71 kg)  02/17/23 160 lb (72.6 kg)  01/22/23 160 lb 8 oz (72.8 kg)     GEN: Well nourished, well developed, in no acute distress. HEENT: normal. Neck: Supple, no JVD, carotid bruits, or masses. Cardiac: RRR, no murmurs, rubs, or gallops. No clubbing, cyanosis, edema.  Radials/PT 2+ and equal bilaterally.  Respiratory:  Respirations regular and unlabored, clear to auscultation bilaterally. GI: Soft, nontender, nondistended. MS: No deformity or atrophy. Skin: Warm and dry, no rash. Neuro:  Strength and sensation are intact. Psych: Normal affect.  Assessment & Plan    Coronary artery disease -Continue carvedilol 3.125 twice a day, Plavix 75 mg daily, Zetia 10 mg daily, Crestor 40 mg daily, stop Entresto 24-26 twice a day -No chest pain at this time -Update echocardiogram  Chronic systolic heart failure -Update echocardiogram -Entresto discontinued at this time due to hypotension that is symptomatic -pulm referral replaced today  Hyperlipidemia -lipid panel next time you see your PCP  -Most recent LDL 156 (January  2024)  CVA -2021, PT  and OT completed          Disposition: Follow up 6 weeks with Christian Miss, MD or APP.  Signed, Sharlene Dory, PA-C 02/23/2023, 4:16 PM [facility]

## 2023-02-24 ENCOUNTER — Telehealth: Payer: Self-pay | Admitting: Physician Assistant

## 2023-02-24 NOTE — Telephone Encounter (Signed)
Patient dropped off document Home Health Certificate (Order ID [id number]), to be filled out by provider. Patient requested to send it via Fax within 5-days. Document is located in providers tray at front office.Please advise

## 2023-02-24 NOTE — Telephone Encounter (Signed)
Received forms from [place], put in [name]'s folder to sign.

## 2023-02-24 NOTE — Telephone Encounter (Signed)
**   received another order w/ different order #  Patient dropped off document Home Health Certificate (Order ID [id number]), to be filled out by provider. Patient requested to send it via Fax within 7-days. Document is located in providers tray at front office.Please advise

## 2023-02-24 NOTE — Telephone Encounter (Signed)
Duplicate forms put in shred box.

## 2023-02-25 NOTE — Telephone Encounter (Signed)
Forms signed and faxed to [facility].

## 2023-03-07 ENCOUNTER — Emergency Department (HOSPITAL_COMMUNITY): Payer: Medicare Other

## 2023-03-07 ENCOUNTER — Encounter (HOSPITAL_COMMUNITY): Payer: Self-pay | Admitting: Family Medicine

## 2023-03-07 ENCOUNTER — Inpatient Hospital Stay (HOSPITAL_COMMUNITY): Payer: Medicare Other

## 2023-03-07 ENCOUNTER — Inpatient Hospital Stay (HOSPITAL_COMMUNITY)
Admission: EM | Admit: 2023-03-07 | Discharge: 2023-03-21 | DRG: 175 | Disposition: A | Discharge status: Home Health | Payer: Medicare Other | Attending: Internal Medicine | Admitting: Internal Medicine

## 2023-03-07 DIAGNOSIS — K7460 Unspecified cirrhosis of liver: Secondary | ICD-10-CM | POA: Diagnosis present

## 2023-03-07 DIAGNOSIS — Z515 Encounter for palliative care: Secondary | ICD-10-CM | POA: Diagnosis not present

## 2023-03-07 DIAGNOSIS — I2489 Other forms of acute ischemic heart disease: Secondary | ICD-10-CM | POA: Diagnosis present

## 2023-03-07 DIAGNOSIS — E1151 Type 2 diabetes mellitus with diabetic peripheral angiopathy without gangrene: Secondary | ICD-10-CM | POA: Diagnosis present

## 2023-03-07 DIAGNOSIS — I959 Hypotension, unspecified: Secondary | ICD-10-CM | POA: Diagnosis present

## 2023-03-07 DIAGNOSIS — J9601 Acute respiratory failure with hypoxia: Secondary | ICD-10-CM | POA: Diagnosis present

## 2023-03-07 DIAGNOSIS — I420 Dilated cardiomyopathy: Secondary | ICD-10-CM

## 2023-03-07 DIAGNOSIS — K769 Liver disease, unspecified: Secondary | ICD-10-CM | POA: Diagnosis not present

## 2023-03-07 DIAGNOSIS — I5082 Biventricular heart failure: Secondary | ICD-10-CM | POA: Diagnosis present

## 2023-03-07 DIAGNOSIS — Z7901 Long term (current) use of anticoagulants: Secondary | ICD-10-CM

## 2023-03-07 DIAGNOSIS — F39 Unspecified mood [affective] disorder: Secondary | ICD-10-CM | POA: Diagnosis present

## 2023-03-07 DIAGNOSIS — E118 Type 2 diabetes mellitus with unspecified complications: Secondary | ICD-10-CM | POA: Diagnosis not present

## 2023-03-07 DIAGNOSIS — Z638 Other specified problems related to primary support group: Secondary | ICD-10-CM

## 2023-03-07 DIAGNOSIS — I679 Cerebrovascular disease, unspecified: Secondary | ICD-10-CM | POA: Diagnosis not present

## 2023-03-07 DIAGNOSIS — I272 Pulmonary hypertension, unspecified: Secondary | ICD-10-CM | POA: Diagnosis present

## 2023-03-07 DIAGNOSIS — I4891 Unspecified atrial fibrillation: Secondary | ICD-10-CM

## 2023-03-07 DIAGNOSIS — F32A Depression, unspecified: Secondary | ICD-10-CM | POA: Diagnosis not present

## 2023-03-07 DIAGNOSIS — I5043 Acute on chronic combined systolic (congestive) and diastolic (congestive) heart failure: Secondary | ICD-10-CM | POA: Diagnosis present

## 2023-03-07 DIAGNOSIS — Z881 Allergy status to other antibiotic agents status: Secondary | ICD-10-CM

## 2023-03-07 DIAGNOSIS — Z66 Do not resuscitate: Secondary | ICD-10-CM | POA: Diagnosis present

## 2023-03-07 DIAGNOSIS — R627 Adult failure to thrive: Secondary | ICD-10-CM | POA: Diagnosis present

## 2023-03-07 DIAGNOSIS — E1165 Type 2 diabetes mellitus with hyperglycemia: Secondary | ICD-10-CM | POA: Diagnosis present

## 2023-03-07 DIAGNOSIS — F0393 Unspecified dementia, unspecified severity, with mood disturbance: Secondary | ICD-10-CM | POA: Diagnosis present

## 2023-03-07 DIAGNOSIS — R0602 Shortness of breath: Secondary | ICD-10-CM | POA: Diagnosis present

## 2023-03-07 DIAGNOSIS — I2510 Atherosclerotic heart disease of native coronary artery without angina pectoris: Secondary | ICD-10-CM | POA: Diagnosis not present

## 2023-03-07 DIAGNOSIS — F418 Other specified anxiety disorders: Secondary | ICD-10-CM | POA: Diagnosis present

## 2023-03-07 DIAGNOSIS — R338 Other retention of urine: Secondary | ICD-10-CM | POA: Diagnosis present

## 2023-03-07 DIAGNOSIS — F329 Major depressive disorder, single episode, unspecified: Secondary | ICD-10-CM | POA: Diagnosis present

## 2023-03-07 DIAGNOSIS — N179 Acute kidney failure, unspecified: Secondary | ICD-10-CM | POA: Diagnosis present

## 2023-03-07 DIAGNOSIS — I5023 Acute on chronic systolic (congestive) heart failure: Secondary | ICD-10-CM | POA: Diagnosis present

## 2023-03-07 DIAGNOSIS — Z86711 Personal history of pulmonary embolism: Secondary | ICD-10-CM

## 2023-03-07 DIAGNOSIS — I350 Nonrheumatic aortic (valve) stenosis: Secondary | ICD-10-CM | POA: Diagnosis present

## 2023-03-07 DIAGNOSIS — Z8249 Family history of ischemic heart disease and other diseases of the circulatory system: Secondary | ICD-10-CM

## 2023-03-07 DIAGNOSIS — Z1152 Encounter for screening for COVID-19: Secondary | ICD-10-CM

## 2023-03-07 DIAGNOSIS — E785 Hyperlipidemia, unspecified: Secondary | ICD-10-CM | POA: Diagnosis present

## 2023-03-07 DIAGNOSIS — H9190 Unspecified hearing loss, unspecified ear: Secondary | ICD-10-CM | POA: Diagnosis present

## 2023-03-07 DIAGNOSIS — I480 Paroxysmal atrial fibrillation: Secondary | ICD-10-CM | POA: Diagnosis present

## 2023-03-07 DIAGNOSIS — R45851 Suicidal ideations: Secondary | ICD-10-CM | POA: Diagnosis present

## 2023-03-07 DIAGNOSIS — Z885 Allergy status to narcotic agent status: Secondary | ICD-10-CM

## 2023-03-07 DIAGNOSIS — I110 Hypertensive heart disease with heart failure: Secondary | ICD-10-CM | POA: Diagnosis present

## 2023-03-07 DIAGNOSIS — Z823 Family history of stroke: Secondary | ICD-10-CM

## 2023-03-07 DIAGNOSIS — I5084 End stage heart failure: Secondary | ICD-10-CM | POA: Diagnosis present

## 2023-03-07 DIAGNOSIS — R7989 Other specified abnormal findings of blood chemistry: Secondary | ICD-10-CM | POA: Diagnosis not present

## 2023-03-07 DIAGNOSIS — I2699 Other pulmonary embolism without acute cor pulmonale: Principal | ICD-10-CM | POA: Diagnosis present

## 2023-03-07 DIAGNOSIS — Z841 Family history of disorders of kidney and ureter: Secondary | ICD-10-CM

## 2023-03-07 DIAGNOSIS — E1140 Type 2 diabetes mellitus with diabetic neuropathy, unspecified: Secondary | ICD-10-CM | POA: Diagnosis present

## 2023-03-07 DIAGNOSIS — R339 Retention of urine, unspecified: Secondary | ICD-10-CM | POA: Diagnosis present

## 2023-03-07 DIAGNOSIS — R319 Hematuria, unspecified: Secondary | ICD-10-CM | POA: Diagnosis not present

## 2023-03-07 DIAGNOSIS — R531 Weakness: Secondary | ICD-10-CM | POA: Diagnosis not present

## 2023-03-07 DIAGNOSIS — Z7984 Long term (current) use of oral hypoglycemic drugs: Secondary | ICD-10-CM

## 2023-03-07 DIAGNOSIS — E1169 Type 2 diabetes mellitus with other specified complication: Secondary | ICD-10-CM | POA: Diagnosis present

## 2023-03-07 DIAGNOSIS — K2100 Gastro-esophageal reflux disease with esophagitis, without bleeding: Secondary | ICD-10-CM | POA: Diagnosis present

## 2023-03-07 DIAGNOSIS — Z7902 Long term (current) use of antithrombotics/antiplatelets: Secondary | ICD-10-CM

## 2023-03-07 DIAGNOSIS — Z8673 Personal history of transient ischemic attack (TIA), and cerebral infarction without residual deficits: Secondary | ICD-10-CM

## 2023-03-07 DIAGNOSIS — I5021 Acute systolic (congestive) heart failure: Secondary | ICD-10-CM | POA: Diagnosis present

## 2023-03-07 DIAGNOSIS — I10 Essential (primary) hypertension: Secondary | ICD-10-CM | POA: Diagnosis not present

## 2023-03-07 DIAGNOSIS — I2609 Other pulmonary embolism with acute cor pulmonale: Secondary | ICD-10-CM | POA: Diagnosis not present

## 2023-03-07 DIAGNOSIS — Z880 Allergy status to penicillin: Secondary | ICD-10-CM

## 2023-03-07 DIAGNOSIS — Z79899 Other long term (current) drug therapy: Secondary | ICD-10-CM

## 2023-03-07 LAB — CBC WITH DIFFERENTIAL/PLATELET
Abs Immature Granulocytes: 0.04 10*3/uL (ref 0.00–0.07)
Basophils Absolute: 0 10*3/uL (ref 0.0–0.1)
Basophils Relative: 0 %
Eosinophils Absolute: 0.1 10*3/uL (ref 0.0–0.5)
Eosinophils Relative: 1 %
HCT: 40.9 % (ref 39.0–52.0)
Hemoglobin: 12.8 g/dL — ABNORMAL LOW (ref 13.0–17.0)
Immature Granulocytes: 0 %
Lymphocytes Relative: 22 %
Lymphs Abs: 2 10*3/uL (ref 0.7–4.0)
MCH: 29.2 pg (ref 26.0–34.0)
MCHC: 31.3 g/dL (ref 30.0–36.0)
MCV: 93.2 fL (ref 80.0–100.0)
Monocytes Absolute: 0.8 10*3/uL (ref 0.1–1.0)
Monocytes Relative: 9 %
Neutro Abs: 6.1 10*3/uL (ref 1.7–7.7)
Neutrophils Relative %: 68 %
Platelets: 217 10*3/uL (ref 150–400)
RBC: 4.39 MIL/uL (ref 4.22–5.81)
RDW: 14.9 % (ref 11.5–15.5)
WBC: 9.1 10*3/uL (ref 4.0–10.5)
nRBC: 0 % (ref 0.0–0.2)

## 2023-03-07 LAB — ECHOCARDIOGRAM COMPLETE
AR max vel: 1.37 cm2
AV Area VTI: 1.19 cm2
AV Area mean vel: 1.26 cm2
AV Mean grad: 4.5 mmHg
AV Peak grad: 8.3 mmHg
Ao pk vel: 1.44 m/s
Est EF: <20
MV M vel: 3.66 m/s
MV Peak grad: 53.6 mmHg
Radius: 0.4 cm
S' Lateral: 5.2 cm

## 2023-03-07 LAB — BASIC METABOLIC PANEL
Anion gap: 11 (ref 5–15)
BUN: 26 mg/dL — ABNORMAL HIGH (ref 8–23)
CO2: 25 mmol/L (ref 22–32)
Calcium: 8.3 mg/dL — ABNORMAL LOW (ref 8.9–10.3)
Chloride: 102 mmol/L (ref 98–111)
Creatinine, Ser: 1.21 mg/dL (ref 0.61–1.24)
GFR, Estimated: 60 mL/min — ABNORMAL LOW (ref >60)
Glucose, Bld: 139 mg/dL — ABNORMAL HIGH (ref 70–99)
Potassium: 3.7 mmol/L (ref 3.5–5.1)
Sodium: 138 mmol/L (ref 135–145)

## 2023-03-07 LAB — MAGNESIUM: Magnesium: 1.9 mg/dL (ref 1.7–2.4)

## 2023-03-07 LAB — TSH: TSH: 2.16 u[IU]/mL (ref 0.350–4.500)

## 2023-03-07 LAB — T4, FREE: Free T4: 0.98 ng/dL (ref 0.61–1.12)

## 2023-03-07 LAB — TROPONIN I (HIGH SENSITIVITY)
Troponin I (High Sensitivity): 528 ng/L (ref <18)
Troponin I (High Sensitivity): 584 ng/L (ref <18)

## 2023-03-07 LAB — SARS CORONAVIRUS 2 BY RT PCR: SARS Coronavirus 2 by RT PCR: NEGATIVE

## 2023-03-07 LAB — HEPARIN LEVEL (UNFRACTIONATED): Heparin Unfractionated: 0.5 IU/mL (ref 0.30–0.70)

## 2023-03-07 LAB — BRAIN NATRIURETIC PEPTIDE: B Natriuretic Peptide: 2668 pg/mL — ABNORMAL HIGH (ref 0.0–100.0)

## 2023-03-07 LAB — GLUCOSE, CAPILLARY: Glucose-Capillary: 95 mg/dL (ref 70–99)

## 2023-03-07 LAB — D-DIMER, QUANTITATIVE: D-Dimer, Quant: 3.06 ug/mL-FEU — ABNORMAL HIGH (ref 0.00–0.50)

## 2023-03-07 MED ORDER — LORAZEPAM 1 MG PO TABS
1.0000 mg | ORAL_TABLET | Freq: Once | ORAL | Status: AC
  Administered 2023-03-07: 1 mg via ORAL
  Filled 2023-03-07: qty 1

## 2023-03-07 MED ORDER — ESCITALOPRAM OXALATE 10 MG PO TABS
20.0000 mg | ORAL_TABLET | Freq: Every day | ORAL | Status: DC
  Administered 2023-03-08 – 2023-03-21 (×14): 20 mg via ORAL
  Filled 2023-03-07 (×14): qty 2

## 2023-03-07 MED ORDER — INSULIN ASPART 100 UNIT/ML IJ SOLN
0.0000 [IU] | Freq: Every day | INTRAMUSCULAR | Status: DC
  Administered 2023-03-12: 2 [IU] via SUBCUTANEOUS

## 2023-03-07 MED ORDER — ONDANSETRON HCL 4 MG/2ML IJ SOLN
4.0000 mg | Freq: Four times a day (QID) | INTRAMUSCULAR | Status: DC | PRN
  Administered 2023-03-10 – 2023-03-19 (×4): 4 mg via INTRAVENOUS
  Filled 2023-03-07 (×4): qty 2

## 2023-03-07 MED ORDER — METOPROLOL TARTRATE 5 MG/5ML IV SOLN
5.0000 mg | Freq: Once | INTRAVENOUS | Status: AC
  Administered 2023-03-07: 5 mg via INTRAVENOUS
  Filled 2023-03-07: qty 5

## 2023-03-07 MED ORDER — POTASSIUM CHLORIDE CRYS ER 20 MEQ PO TBCR
20.0000 meq | EXTENDED_RELEASE_TABLET | Freq: Every day | ORAL | Status: DC
  Administered 2023-03-08 – 2023-03-09 (×2): 20 meq via ORAL
  Filled 2023-03-07 (×2): qty 1

## 2023-03-07 MED ORDER — ONDANSETRON HCL 4 MG PO TABS: 4.0000 mg | ORAL_TABLET | Freq: Four times a day (QID) | ORAL | Status: DC | PRN

## 2023-03-07 MED ORDER — FUROSEMIDE 10 MG/ML IJ SOLN
40.0000 mg | Freq: Every day | INTRAMUSCULAR | Status: DC
  Administered 2023-03-08 – 2023-03-09 (×2): 40 mg via INTRAVENOUS
  Filled 2023-03-07 (×2): qty 4

## 2023-03-07 MED ORDER — HEPARIN (PORCINE) 25000 UT/250ML-% IV SOLN
1100.0000 [IU]/h | INTRAVENOUS | Status: DC
  Administered 2023-03-07: 1200 [IU]/h via INTRAVENOUS
  Administered 2023-03-08: 1100 [IU]/h via INTRAVENOUS
  Filled 2023-03-07 (×2): qty 250

## 2023-03-07 MED ORDER — ACETAMINOPHEN 325 MG PO TABS
650.0000 mg | ORAL_TABLET | Freq: Four times a day (QID) | ORAL | Status: DC | PRN
  Administered 2023-03-08 – 2023-03-10 (×2): 650 mg via ORAL
  Filled 2023-03-07 (×2): qty 2

## 2023-03-07 MED ORDER — ACETAMINOPHEN 650 MG RE SUPP: 650.0000 mg | Freq: Four times a day (QID) | RECTAL | Status: DC | PRN

## 2023-03-07 MED ORDER — EZETIMIBE 10 MG PO TABS
10.0000 mg | ORAL_TABLET | Freq: Every day | ORAL | Status: DC
  Administered 2023-03-08 – 2023-03-21 (×14): 10 mg via ORAL
  Filled 2023-03-07 (×14): qty 1

## 2023-03-07 MED ORDER — INSULIN ASPART 100 UNIT/ML IJ SOLN
0.0000 [IU] | Freq: Three times a day (TID) | INTRAMUSCULAR | Status: DC
  Administered 2023-03-08: 3 [IU] via SUBCUTANEOUS
  Administered 2023-03-09: 2 [IU] via SUBCUTANEOUS
  Administered 2023-03-10 (×2): 3 [IU] via SUBCUTANEOUS
  Administered 2023-03-11 (×3): 2 [IU] via SUBCUTANEOUS
  Administered 2023-03-12 (×2): 3 [IU] via SUBCUTANEOUS
  Administered 2023-03-13 (×3): 2 [IU] via SUBCUTANEOUS
  Administered 2023-03-14: 3 [IU] via SUBCUTANEOUS
  Administered 2023-03-14 – 2023-03-17 (×4): 2 [IU] via SUBCUTANEOUS
  Administered 2023-03-18: 3 [IU] via SUBCUTANEOUS
  Administered 2023-03-18 – 2023-03-20 (×3): 2 [IU] via SUBCUTANEOUS
  Administered 2023-03-20: 5 [IU] via SUBCUTANEOUS

## 2023-03-07 MED ORDER — IOHEXOL 350 MG/ML SOLN
75.0000 mL | Freq: Once | INTRAVENOUS | Status: AC | PRN
  Administered 2023-03-07: 75 mL via INTRAVENOUS

## 2023-03-07 MED ORDER — HEPARIN BOLUS VIA INFUSION
5000.0000 [IU] | Freq: Once | INTRAVENOUS | Status: AC
  Administered 2023-03-07: 5000 [IU] via INTRAVENOUS
  Filled 2023-03-07: qty 5000

## 2023-03-07 MED ORDER — CARVEDILOL 3.125 MG PO TABS
3.1250 mg | ORAL_TABLET | Freq: Every day | ORAL | Status: DC
  Administered 2023-03-08 – 2023-03-09 (×2): 3.125 mg via ORAL
  Filled 2023-03-07 (×2): qty 1

## 2023-03-07 NOTE — Assessment & Plan Note (Deleted)
Blood pressure low - Antihypertensives/guideline directed CHF therapy per cardiology

## 2023-03-07 NOTE — Progress Notes (Signed)
ANTICOAGULATION CONSULT NOTE - Initial Consult  Pharmacy Consult for Heparin Indication: pulmonary embolus  Allergies  Allergen Reactions   Amoxicillin Hives   Bactrim [Sulfamethoxazole-Trimethoprim] Other (See Comments)    Dried out his eyes   Cephalexin Itching   Hydrocodone Itching   Lipitor [Atorvastatin] Other (See Comments)    Damage to calf muscles   Oxycodone Itching   Statins Other (See Comments)    Muscle pain    Vytorin [Ezetimibe-Simvastatin] Other (See Comments)    Damage to calf muscles    Patient Measurements: Weight: 71.5 kg (157 lb 10.1 oz) Heparin Dosing Weight: TBW  Vital Signs: Temp: 97 F (36.1 C) (05/18 2046) Temp Source: Oral (05/18 2046) BP: 95/80 (05/18 2046) Pulse Rate: 79 (05/18 2046)  Labs: Recent Labs    03/07/23 0952 03/07/23 1303 03/07/23 2133  HGB 12.8*  --   --   HCT 40.9  --   --   PLT 217  --   --   HEPARINUNFRC  --   --  0.50  CREATININE 1.21  --   --   TROPONINIHS 528* 584*  --     Estimated Creatinine Clearance: 42.5 mL/min (by C-G formula based on SCr of 1.21 mg/dL).   Medical History: Past Medical History:  Diagnosis Date   Allergic rhinitis due to pollen 03/29/2018   Chronic systolic heart failure (HCC) - EF improved to normal on Rx    Echo 11/18: Mild LVH, EF 20, diffuse HK mild AI, a sending aorta 42 mm (mildly dilated), mild MR, mild TR, trivial PI, PASP 44 // Echo 3/19: mild LVH, EF 25-30, diff HK, Gr 1 DD, asc aorta 43 mm (mildly dilated), MAC, mild reduced RVSF // Limited echo 10/19: Mild concentric LVH, EF 60-65, grade 1 diastolic dysfunction, MAC, mild LAE    Coronary artery disease involving native coronary artery of native heart without angina pectoris 11/11/2017   R/L HC 11/18: pLAD 45, oD1 65 ; elevated PCWP (mean 29), moderate pulmonary hypertension, CO 3.66, CI 2.07   Depression with anxiety 07/08/2017   Essential hypertension 07/08/2017   GERD with esophagitis 01/11/2018   Hernia of abdominal cavity     Hyperlipidemia    Hyperlipidemia LDL goal <130 05/25/2017   The 10-year ASCVD risk score Denman George DC Jr., et al., 2013) is: 31.1%   Values used to calculate the score:     Age: 82 years     Sex: Male     Is Non-Hispanic African American: No     Diabetic: No     Tobacco smoker: No     Systolic Blood Pressure: 140 mmHg     Is BP treated: No     HDL Cholesterol: 68.7 mg/dL     Total Cholesterol: 258 mg/dL    Idiopathic inflammatory myopathy 05/25/2017   Neuropathy involving both lower extremities 05/25/2017   Nonischemic cardiomyopathy (HCC) 11/11/2017   LHC 09/16/17-nonobstructive CAD   Oropharyngeal dysphagia 01/11/2018   Type 2 diabetes mellitus with complication, without long-term current use of insulin (HCC) 07/08/2017    Assessment: 82 YOM presenting with SOB, CT angio chest with multiple bilateral segmental PE. No anticoagulation prior to admission. Pharmacy consulted for heparin.     Heparin level 0.5 is therapeutic on 1200 units/hr.   Goal of Therapy:  Heparin level 0.3-0.7 units/ml Monitor platelets by anticoagulation protocol: Yes   Plan:  Continue Heparin 1200 units/hr Monitor daily heparin level, CBC Monitor for signs/symptoms of bleeding  F/u long term AC plan  Alphia Moh, PharmD, BCPS, BCCP Clinical Pharmacist  Please check AMION for all [facility] phone numbers After 10:00 PM, call Main Pharmacy [phone]

## 2023-03-07 NOTE — Progress Notes (Signed)
  Echocardiogram 2D Echocardiogram has been performed.  Delcie Roch 03/07/2023, 6:16 PM

## 2023-03-07 NOTE — Assessment & Plan Note (Addendum)
Continue blood pressure control, and ezetimibe

## 2023-03-07 NOTE — Assessment & Plan Note (Addendum)
Uncontrolled with hyperglycemia.   Patient was placed on insulin therapy for glucose cover and monitoring.   Continue with ezetimibe.

## 2023-03-07 NOTE — ED Triage Notes (Signed)
Patient BIB EMS coming from home c/o SOB x1 month. H/O CHF. 98/68 bolus given. VSS. 20g LAC. CBG 149.

## 2023-03-07 NOTE — Hospital Course (Addendum)
Christian [name] was admitted to the hospital with the working diagnosis of heat failure exacerbation.   [name] with history of chronic systolic CHF, NICM, CVA, hypertension, type 2 diabetes mellitus, and cognitive impairment who presented to the ED with dyspnea on exertion for several months worsened over 2 days. On the day of admission his symptoms were very severe, he was pale and very weak, EMS was called and patient was transported to the ED. On his initial physical examination his blood pressure was 104/88, HR 82, RR 80 and 02 saturation 60 to 88%. Lungs with decreases breath sounds bilaterally, heart with S1 and S2 present irregularly irregular, abdomen with no distention and positive lower extremity edema.   Na 138, K 3,7 Cl 102 bicarbonate 25 glucose 139, bun 26 cr 1,21 Mg 1,9  BNP 2,688 High sensitive troponin 528, 584, 527  Wbc 9,1 hgb 12,8 plt 217   Chest radiograph with cardiomegaly and bilateral hilar vascular congestion.  CT chest with bilateral faint ground glass opacities, with small bilateral pleural effusions. Bilateral pulmonary embolism in the central right upper lobe pulmonary artery and segmental branches, anterior segmental branch in the right lower lobe pulmonary artery, and posterior segmental branches of the left lower lobe pulmonary artery. No evidence or elevated RV/LV ration.    EKG 95 bpm, right axis deviation, qtc 575, interventricular conduction delay, atrial fibrillation rhythm with poor R R wave progression, no significant ST segment or T wave changes.   Started on IV heparin -5/19, diuretics started, echo noted EF down to<20% -5/20 advanced heart failure team consulted, transferred to SDU for dobutamine -5/21 tachyarrhythmia started on amiodarone palliative care consulted -5/22, reported suicidal ideation, psych consulted, dobutamine discontinued -5/23, palliative meeting again, patient and ex-wife decided to pursue SNF for short-term rehab with palliative care,  cleared by psychiatry for rehab  05/29 patient hemodynamically stable but continue very weak and deconditioned.  His care giver is concerned about safety of him alone at home.  05/30 patient continue very weak, possible transition to SNF.  06/01 patient has improved his mobility, no dyspnea or chest pain. Considering his improvement in physical function, plan is to discharge home with home health services.  Follow up as outpatient.

## 2023-03-07 NOTE — Assessment & Plan Note (Deleted)
CTA chest on admission showed bilateral PE.    No indication for lytics. - Continue new Eliquis

## 2023-03-07 NOTE — Progress Notes (Signed)
ANTICOAGULATION CONSULT NOTE - Initial Consult  Pharmacy Consult for Heparin Indication: pulmonary embolus  Allergies  Allergen Reactions   Amoxicillin Hives   Bactrim [Sulfamethoxazole-Trimethoprim] Other (See Comments)    Dried out his eyes   Cephalexin Itching   Hydrocodone Itching   Lipitor [Atorvastatin] Other (See Comments)    Damage to calf muscles   Oxycodone Itching   Statins Other (See Comments)    Muscle pain    Vytorin [Ezetimibe-Simvastatin] Other (See Comments)    Damage to calf muscles    Patient Measurements:   Heparin Dosing Weight: TBW  Vital Signs: Temp: 97.5 F (36.4 C) (05/18 1036) Temp Source: Oral (05/18 1036) BP: 108/90 (05/18 1015) Pulse Rate: 61 (05/18 1015)  Labs: Recent Labs    03/07/23 0952  HGB 12.8*  HCT 40.9  PLT 217  CREATININE 1.21  TROPONINIHS 528*    Estimated Creatinine Clearance: 42.5 mL/min (by C-G formula based on SCr of 1.21 mg/dL).   Medical History: Past Medical History:  Diagnosis Date   Allergic rhinitis due to pollen 03/29/2018   Chronic systolic heart failure (HCC) - EF improved to normal on Rx    Echo 11/18: Mild LVH, EF 20, diffuse HK mild AI, a sending aorta 42 mm (mildly dilated), mild MR, mild TR, trivial PI, PASP 44 // Echo 3/19: mild LVH, EF 25-30, diff HK, Gr 1 DD, asc aorta 43 mm (mildly dilated), MAC, mild reduced RVSF // Limited echo 10/19: Mild concentric LVH, EF 60-65, grade 1 diastolic dysfunction, MAC, mild LAE    Coronary artery disease involving native coronary artery of native heart without angina pectoris 11/11/2017   R/L HC 11/18: pLAD 45, oD1 65 ; elevated PCWP (mean 29), moderate pulmonary hypertension, CO 3.66, CI 2.07   Depression with anxiety 07/08/2017   Essential hypertension 07/08/2017   GERD with esophagitis 01/11/2018   Hernia of abdominal cavity    Hyperlipidemia    Hyperlipidemia LDL goal <130 05/25/2017   The 10-year ASCVD risk score Denman George DC Jr., et al., 2013) is: 31.1%   Values  used to calculate the score:     Age: 82 years     Sex: Male     Is Non-Hispanic African American: No     Diabetic: No     Tobacco smoker: No     Systolic Blood Pressure: 140 mmHg     Is BP treated: No     HDL Cholesterol: 68.7 mg/dL     Total Cholesterol: 258 mg/dL    Idiopathic inflammatory myopathy 05/25/2017   Neuropathy involving both lower extremities 05/25/2017   Nonischemic cardiomyopathy (HCC) 11/11/2017   LHC 09/16/17-nonobstructive CAD   Oropharyngeal dysphagia 01/11/2018   Type 2 diabetes mellitus with complication, without long-term current use of insulin (HCC) 07/08/2017    Assessment: 81 YOM presenting with SOB, CT angio chest with bilateral PE, he is not on anticoagulation PTA, CBC wnl  Goal of Therapy:  Heparin level 0.3-0.7 units/ml Monitor platelets by anticoagulation protocol: Yes   Plan:  Heparin 5000 units IV x 1, and gtt at 1200 units/hr F/u 8 hour heparin level F/u long term [facility] plan   Daylene Posey, PharmD, [facility] Clinical Pharmacist ED Pharmacist Phone # [phone] 03/07/2023 12:11 PM

## 2023-03-07 NOTE — ED Provider Notes (Signed)
[place] EMERGENCY DEPARTMENT AT [facility] Provider Note   CSN: [id number] Arrival date & time: 03/07/23  0920     History  Chief Complaint  Patient presents with   Shortness of Breath    Christian [name] is a 82 y.o. male.   Shortness of Breath    82 year old male with medical history significant for CVA, nonischemic cardiomyopathy with CHF with an EF of 20% in 2018 with recovery on Entresto with a most recent echocardiogram showing an EF of 60 to 65% and May 2021 who presents to the emergency department with chronic shortness of breath.  The patient endorses dyspnea on exertion and orthopnea.  He endorses some pleuritic chest discomfort that has been ongoing for months.  He denies any chest pressure or ripping tearing chest pain.  He denies any lower extremity swelling.  He denies any fever or chills or cough.  Home Medications Prior to Admission medications   Medication Sig Start Date End Date Taking? Authorizing Provider  acetaminophen (TYLENOL) 325 MG tablet Take 2 tablets (650 mg total) by mouth every 4 (four) hours as needed for mild pain (or temp > 37.5 C (99.5 F)). 03/07/20   Angiulli, Mcarthur Rossetti, PA-C  albuterol (VENTOLIN HFA) 108 (90 Base) MCG/ACT inhaler Inhale 2 puffs into the lungs every 6 (six) hours as needed for wheezing or shortness of breath. 04/29/22   Janeece Agee, NP  carvedilol (COREG) 3.125 MG tablet TAKE 1 TABLET BY MOUTH DAILY 02/01/21   Nahser, Deloris Ping, MD  cholecalciferol (VITAMIN D) 1000 units tablet Take 1 tablet (1,000 Units total) by mouth daily. 03/07/20   Angiulli, Mcarthur Rossetti, PA-C  clopidogrel (PLAVIX) 75 MG tablet TAKE 1 TABLET BY MOUTH DAILY 12/08/22   Nahser, Deloris Ping, MD  [employer] GAS RELIEF EXTRA STRENGTH 125 MG chewable tablet Chew 125 mg by mouth as needed. 04/02/20   [provider]  escitalopram (LEXAPRO) 20 MG tablet Take 1 tablet (20 mg total) by mouth daily. 10/27/22   [name], PA  esomeprazole (NEXIUM) 40 MG capsule TAKE  1 CAPSULE BY MOUTH DAILY AS NEEDED FOR HEARTBURN 10/27/22   [name], PA  ezetimibe (ZETIA) 10 MG tablet Take 1 tablet (10 mg total) by mouth daily. 04/09/21   Nahser, Deloris Ping, MD  hydrOXYzine (ATARAX) 10 MG tablet Take 1 tablet (10 mg total) by mouth 2 (two) times daily as needed for itching or anxiety. 01/22/23   [name], PA  metFORMIN (GLUCOPHAGE) 500 MG tablet Take 1 tablet (500 mg total) by mouth daily with breakfast. 10/29/22   [name], PA  olopatadine (PATANOL) 0.1 % ophthalmic solution INSTILL 1 DROP INTO BOTH EYS DAILY AS NEEDED FOR ALLERGIES 03/20/20   Janeece Agee, NP  ondansetron (ZOFRAN-ODT) 4 MG disintegrating tablet TAKE 2 TABLETS BY MOUTH AS NEEDED 12/31/22   [name], PA  rosuvastatin (CRESTOR) 40 MG tablet Take 1 tablet (40 mg total) by mouth daily. 10/29/22   [name], PA  traZODone (DESYREL) 50 MG tablet Take 0.5-1 tablets (25-50 mg total) by mouth at bedtime as needed for sleep. 10/27/22   [name], PA  triamcinolone cream (KENALOG) 0.1 % 1 application Externally once a day, mix w/ itch relief lotion for 30 days    [provider]      Allergies    Amoxicillin, Bactrim [sulfamethoxazole-trimethoprim], Cephalexin, Hydrocodone, Lipitor [atorvastatin], Oxycodone, Statins, and Vytorin [ezetimibe-simvastatin]    Review of Systems   Review of Systems  Respiratory:  Positive for shortness  of breath.   All other systems reviewed and are negative.   Physical Exam Updated Vital Signs BP 102/87   Pulse 86   Temp 98 F (36.7 C)   Resp 20   SpO2 93%  Physical Exam Vitals and nursing note reviewed.  Constitutional:      General: He is not in acute distress.    Appearance: He is well-developed.  HENT:     Head: Normocephalic and atraumatic.  Eyes:     Conjunctiva/sclera: Conjunctivae normal.  Cardiovascular:     Rate and Rhythm: Tachycardia present. Rhythm irregular.  Pulmonary:     Effort: Pulmonary effort is normal.  Tachypnea present. No respiratory distress.     Breath sounds: Normal breath sounds.  Abdominal:     Palpations: Abdomen is soft.     Tenderness: There is no abdominal tenderness.  Musculoskeletal:        General: No swelling.     Cervical back: Neck supple.     Right lower leg: No edema.     Left lower leg: No edema.  Skin:    General: Skin is warm and dry.     Capillary Refill: Capillary refill takes less than 2 seconds.  Neurological:     Mental Status: He is alert.  Psychiatric:        Mood and Affect: Mood normal.     ED Results / Procedures / Treatments   Labs (all labs ordered are listed, but only abnormal results are displayed) Labs Reviewed  BASIC METABOLIC PANEL - Abnormal; Notable for the following components:      Result Value   Glucose, Bld 139 (*)    BUN 26 (*)    Calcium 8.3 (*)    GFR, Estimated 60 (*)    All other components within normal limits  BRAIN NATRIURETIC PEPTIDE - Abnormal; Notable for the following components:   B Natriuretic Peptide 2,668.0 (*)    All other components within normal limits  CBC WITH DIFFERENTIAL/PLATELET - Abnormal; Notable for the following components:   Hemoglobin 12.8 (*)    All other components within normal limits  D-DIMER, QUANTITATIVE - Abnormal; Notable for the following components:   D-Dimer, Quant 3.06 (*)    All other components within normal limits  TROPONIN I (HIGH SENSITIVITY) - Abnormal; Notable for the following components:   Troponin I (High Sensitivity) 528 (*)    All other components within normal limits  TROPONIN I (HIGH SENSITIVITY) - Abnormal; Notable for the following components:   Troponin I (High Sensitivity) 584 (*)    All other components within normal limits  SARS CORONAVIRUS 2 BY RT PCR  MAGNESIUM  TSH  T4, FREE  HEPARIN LEVEL (UNFRACTIONATED)  HEPARIN LEVEL (UNFRACTIONATED)  CBC    EKG EKG Interpretation  Date/Time:  Saturday Mar 07 2023 09:57:40 EDT Ventricular Rate:  99 PR  Interval:  184 QRS Duration: 145 QT Interval:  424 QTC Calculation: 545 R Axis:   225 Text Interpretation: Atrial fibrillation Anterior infarct, old Prolonged QT interval Confirmed by Ernie Avena ([id number]) on 03/07/2023 10:43:25 AM  Radiology CT Angio Chest PE W and/or Wo Contrast  Result Date: 03/07/2023 CLINICAL DATA:  Shortness of breath.  Elevated D-dimer. EXAM: CT ANGIOGRAPHY CHEST WITH CONTRAST TECHNIQUE: Multidetector CT imaging of the chest was performed using the standard protocol during bolus administration of intravenous contrast. Multiplanar CT image reconstructions and MIPs were obtained to evaluate the vascular anatomy. RADIATION DOSE REDUCTION: This exam was performed according to the departmental  dose-optimization program which includes automated exposure control, adjustment of the mA and/or kV according to patient size and/or use of iterative reconstruction technique. CONTRAST:  75mL OMNIPAQUE IOHEXOL 350 MG/ML SOLN COMPARISON:  11/15/2016 FINDINGS: Cardiovascular: Pulmonary emboli are seen in the central right upper lobe pulmonary artery and segmental branches, an anterior segmental branch in the right lower lobe pulmonary artery, and posterior segmental branches of the left lower lobe pulmonary artery. No evidence of elevated RV/LV ratio. No evidence of thoracic aortic dissection or aneurysm. Contrast reflux into IVC and hepatic veins is consistent with right heart insufficiency. Mediastinum/Nodes: No masses or pathologically enlarged lymph nodes identified. Lungs/Pleura: Small pleural effusions are seen bilaterally with mild dependent atelectasis. No evidence of pulmonary consolidation or mass. Upper abdomen: Hepatic cirrhosis noted. 2.3 cm low-attenuation lesion is seen in the lateral segment of the left hepatic lobe, which cannot be characterized on this study. Musculoskeletal: No suspicious bone lesions identified. Review of the MIP images confirms the above findings. IMPRESSION:  Bilateral pulmonary embolism, as described above. No CT evidence of right heart strain. Small bilateral pleural effusions and mild dependent atelectasis. Hepatic cirrhosis. Findings consistent with right heart insufficiency. 2.3 cm low-attenuation lesion in left hepatic lobe, which cannot be characterized on this study. Recommend abdomen MRI without and with contrast for further characterization. Critical Value/emergent results were called by telephone at the time of interpretation on 03/07/2023 at 12:53 pm to provider Ernie Avena , who verbally acknowledged these results. Electronically Signed   By: Danae Orleans M.D.   On: 03/07/2023 12:57   DG Chest Portable 1 View  Result Date: 03/07/2023 CLINICAL DATA:  Shortness of breath EXAM: PORTABLE CHEST 1 VIEW COMPARISON:  02/24/2020 FINDINGS: Cardiomegaly, vascular congestion. Mild perihilar and infrahilar airspace opacities, likely edema. No effusions or acute bony abnormality. IMPRESSION: Mild CHF. Electronically Signed   By: Charlett Nose M.D.   On: 03/07/2023 10:13    Procedures .Critical Care  Performed by: Ernie Avena, MD Authorized by: Ernie Avena, MD   Critical care provider statement:    Critical care time (minutes):  86   Critical care was necessary to treat or prevent imminent or life-threatening deterioration of the following conditions:  Respiratory failure   Critical care was time spent personally by me on the following activities:  Development of treatment plan with patient or surrogate, discussions with consultants, evaluation of patient's response to treatment, examination of patient, ordering and review of laboratory studies, ordering and review of radiographic studies, ordering and performing treatments and interventions, pulse oximetry, re-evaluation of patient's condition and review of old charts   Care discussed with: admitting provider       Medications Ordered in ED Medications  heparin ADULT infusion 100 units/mL (25000  units/274mL) (1,200 Units/hr Intravenous New Bag/Given 03/07/23 1304)  metoprolol tartrate (LOPRESSOR) injection 5 mg (5 mg Intravenous Given 03/07/23 1036)  iohexol (OMNIPAQUE) 350 MG/ML injection 75 mL (75 mLs Intravenous Contrast Given 03/07/23 1209)  heparin bolus via infusion 5,000 Units (5,000 Units Intravenous Bolus from Bag 03/07/23 1305)  LORazepam (ATIVAN) tablet 1 mg (1 mg Oral Given 03/07/23 1438)    ED Course/ Medical Decision Making/ A&P Clinical Course as of 03/07/23 1756  Sat Mar 07, 2023  1042 D-Dimer, Quant(!): 3.06 [JL]  1205 Troponin I (High Sensitivity)(!!): 528 [JL]  1205 B Natriuretic Peptide(!): 4,098.1 [JL]    Clinical Course User Index [JL] Ernie Avena, MD  Medical Decision Making Amount and/or Complexity of Data Reviewed Labs: ordered. Decision-making details documented in ED Course. Radiology: ordered.  Risk Prescription drug management. Decision regarding hospitalization.    82 year old male with medical history significant for CVA, nonischemic cardiomyopathy with CHF with an EF of 20% in 2018 with recovery on Entresto with a most recent echocardiogram showing an EF of 60 to 65% and May 2021 who presents to the emergency department with chronic shortness of breath.  The patient endorses dyspnea on exertion and orthopnea.  He endorses some pleuritic chest discomfort that has been ongoing for months.  He denies any chest pressure or ripping tearing chest pain.  He denies any lower extremity swelling.  He denies any fever or chills or cough.  On arrival, the tachycardic heart rate 122, new onset atrial fibrillation noted on cardiac telemetry, RR 28, BP 115/94, saturating 95% on room air.  Physical exam generally unremarkable as per above.  Differential diagnose includes pulmonary embolism, electrolyte abnormality, heart failure, pleural effusion, pneumothorax, pneumonia, viral infection.  Initial EKG revealed atrial fibrillation,  ventricular rate 99, old anterior infarct, prolonged QT interval noted to 545.  Initial chest x-ray revealed mild evidence of CHF with interstitial airspace opacities, likely edema, no effusions or pneumothorax, no pneumonia.  The patient was administered IV metoprolol with subsequent improvement in his heart rate.  Laboratory evaluation significant for initial cardiac troponin elevated at 528, repeat uptrending to 584, BNP elevated to 2668, D-dimer elevated to 3.06.   CT angiogram was performed which revealed bilateral PEs without clear evidence of right heart strain by CT imaging.    CTA PE: IMPRESSION:  Bilateral pulmonary embolism, as described above. No CT evidence of  right heart strain.    Small bilateral pleural effusions and mild dependent atelectasis.    Hepatic cirrhosis. Findings consistent with right heart  insufficiency.    2.3 cm low-attenuation lesion in left hepatic lobe, which cannot be  characterized on this study. Recommend abdomen MRI without and with  contrast for further characterization.    Critical Value/emergent results were called by telephone at the time  of interpretation on 03/07/2023 at 12:53 pm to provider Ernie Avena  , who verbally acknowledged these results.    The patient was started on heparin and informed of his results. No CT evidence of right heart strain, however he has an initial troponin of >500 and an elevated BNP of 2668. Repeat troponin uptrending at 584. PCCM consulted for further recommendations, will see and evaluate need for ICU vs step down. BP did trend down to 98/61 but the patient remained hemodynamically stable and this was after Ativan administration.   Critical care recommended heparin drip, echocardiogram and consider cardiology consult based on results, admission to medicine service, progressive worse telemetry.  Hospitalist medicine was consulted for admission and Dr. Maryfrances Bunnell accepted the patient in admission.   Final  Clinical Impression(s) / ED Diagnoses Final diagnoses:  Shortness of breath  Atrial fibrillation, unspecified type (HCC)  Elevated troponin  Pulmonary embolism, unspecified chronicity, unspecified pulmonary embolism type, unspecified whether acute cor pulmonale present [facility])    Rx / DC Orders ED Discharge Orders     None         Ernie Avena, MD 03/07/23 1756

## 2023-03-07 NOTE — Assessment & Plan Note (Deleted)
-   Continue Zetia - Follow med rec re: Crestor

## 2023-03-07 NOTE — ED Notes (Signed)
ED TO INPATIENT HANDOFF REPORT  ED Nurse Name and Phone #: Emanuele Mcwhirter/ [phone]  S Name/Age/Gender Christian [name] 82 y.o. male Room/Bed: 028C/028C  Code Status   Code Status: DNR  Home/SNF/Other Home Patient oriented to: self, place, time, and situation Is this baseline? Yes   Triage Complete: Triage complete  Chief Complaint Acute on chronic systolic CHF (congestive heart failure) (HCC) [I50.23]  Triage Note Patient BIB EMS coming from home c/o SOB x1 month. H/O CHF. 98/68 bolus given. VSS. 20g LAC. CBG 149.    Allergies Allergies  Allergen Reactions   Amoxicillin Hives   Bactrim [Sulfamethoxazole-Trimethoprim] Other (See Comments)    Dried out his eyes   Cephalexin Itching   Hydrocodone Itching   Lipitor [Atorvastatin] Other (See Comments)    Damage to calf muscles   Oxycodone Itching   Statins Other (See Comments)    Muscle pain    Vytorin [Ezetimibe-Simvastatin] Other (See Comments)    Damage to calf muscles    Level of Care/Admitting Diagnosis ED Disposition     ED Disposition  Admit   Condition  --   Comment  Hospital Area: [facility] [[id number]]  Level of Care: Telemetry Cardiac [[id number]]  May admit patient to [facility] or [facility] if equivalent level of care is available:: Yes  Covid Evaluation: Asymptomatic - no recent exposure (last 10 days) testing not required  Diagnosis: Acute on chronic systolic CHF (congestive heart failure) [facility]) [[id number]]  Admitting Physician: Alberteen Sam [[id number]]  Attending Physician: Alberteen Sam [[id number]]  Certification:: I certify this patient will need inpatient services for at least 2 midnights  Estimated Length of Stay: 4          B Medical/Surgery History Past Medical History:  Diagnosis Date   Allergic rhinitis due to pollen 03/29/2018   Chronic systolic heart failure (HCC) - EF improved to normal on Rx    Echo 11/18: Mild LVH, EF 20, diffuse HK mild AI, a sending  aorta 42 mm (mildly dilated), mild MR, mild TR, trivial PI, PASP 44 // Echo 3/19: mild LVH, EF 25-30, diff HK, Gr 1 DD, asc aorta 43 mm (mildly dilated), MAC, mild reduced RVSF // Limited echo 10/19: Mild concentric LVH, EF 60-65, grade 1 diastolic dysfunction, MAC, mild LAE    Coronary artery disease involving native coronary artery of native heart without angina pectoris 11/11/2017   R/L HC 11/18: pLAD 45, oD1 65 ; elevated PCWP (mean 29), moderate pulmonary hypertension, CO 3.66, CI 2.07   Depression with anxiety 07/08/2017   Essential hypertension 07/08/2017   GERD with esophagitis 01/11/2018   Hernia of abdominal cavity    Hyperlipidemia    Hyperlipidemia LDL goal <130 05/25/2017   The 10-year ASCVD risk score Denman George DC Jr., et al., 2013) is: 31.1%   Values used to calculate the score:     Age: 59 years     Sex: Male     Is Non-Hispanic African American: No     Diabetic: No     Tobacco smoker: No     Systolic Blood Pressure: 140 mmHg     Is BP treated: No     HDL Cholesterol: 68.7 mg/dL     Total Cholesterol: 258 mg/dL    Idiopathic inflammatory myopathy 05/25/2017   Neuropathy involving both lower extremities 05/25/2017   Nonischemic cardiomyopathy (HCC) 11/11/2017   LHC 09/16/17-nonobstructive CAD   Oropharyngeal dysphagia 01/11/2018   Type 2 diabetes mellitus with complication, without long-term current  use of insulin (HCC) 07/08/2017   Past Surgical History:  Procedure Laterality Date   BACK SURGERY     HERNIA REPAIR     RIGHT/LEFT HEART CATH AND CORONARY ANGIOGRAPHY N/A 09/16/2017   Procedure: RIGHT/LEFT HEART CATH AND CORONARY ANGIOGRAPHY;  Surgeon: [place]azil[place], Peter M, MD;  Location: [facility] INVASIVE CV LAB;  Service: Cardiovascular;  Laterality: N/A;   SPINE SURGERY       A IV Location/Drains/Wounds Patient Lines/Drains/Airways Status     Active Line/Drains/Airways     Name Placement date Placement time Site Days   Peripheral IV 03/07/23 20 G Anterior;Left;Proximal Forearm 03/07/23  1001   Forearm  less than 1   Peripheral IV 03/07/23 18 G Anterior;Proximal;Right Forearm 03/07/23  1259  Forearm  less than 1   Sheath 09/16/17 Left Venous;Brachial 09/16/17  1105  Venous;Brachial  1998            Intake/Output Last 24 hours No intake or output data in the 24 hours ending 03/07/23 1810  Labs/Imaging Results for orders placed or performed during the hospital encounter of 03/07/23 (from the past 48 hour(s))  SARS Coronavirus 2 by RT PCR (hospital order, performed in [facility] hospital lab) *cepheid single result test* Anterior Nasal Swab     Status: None   Collection Time: 03/07/23  9:46 AM   Specimen: Anterior Nasal Swab  Result Value Ref Range   SARS Coronavirus 2 by RT PCR NEGATIVE NEGATIVE    Comment: Performed at [facility], [address] [place], [place] [address]  Basic metabolic panel     Status: Abnormal   Collection Time: 03/07/23  9:52 AM  Result Value Ref Range   Sodium 138 135 - 145 mmol/L   Potassium 3.7 3.5 - 5.1 mmol/L   Chloride 102 98 - 111 mmol/L   CO2 25 22 - 32 mmol/L   Glucose, Bld 139 (H) 70 - 99 mg/dL    Comment: Glucose reference range applies only to samples taken after fasting for at least 8 hours.   BUN 26 (H) 8 - 23 mg/dL   Creatinine, Ser 6.04 0.61 - 1.24 mg/dL   Calcium 8.3 (L) 8.9 - 10.3 mg/dL   GFR, Estimated 60 (L) >60 mL/min    Comment: (NOTE) Calculated using the CKD-EPI Creatinine Equation (2021)    Anion gap 11 5 - 15    Comment: Performed at [facility], [address], [place], [place] [address]  Magnesium     Status: None   Collection Time: 03/07/23  9:52 AM  Result Value Ref Range   Magnesium 1.9 1.7 - 2.4 mg/dL    Comment: Performed at [facility], [address] [place], [place] [address]  Brain natriuretic peptide (order ONLY if patient c/o SOB)     Status: Abnormal   Collection Time: 03/07/23  9:52 AM  Result Value Ref Range   B Natriuretic Peptide 2,668.0 (H) 0.0 - 100.0 pg/mL     Comment: Performed at [facility], [address], [place], [place] [address]  CBC with Differential/Platelet     Status: Abnormal   Collection Time: 03/07/23  9:52 AM  Result Value Ref Range   WBC 9.1 4.0 - 10.5 K/uL   RBC 4.39 4.22 - 5.81 MIL/uL   Hemoglobin 12.8 (L) 13.0 - 17.0 g/dL   HCT 62.1 30.8 - 65.7 %   MCV 93.2 80.0 - 100.0 fL   MCH 29.2 26.0 - 34.0 pg  MCHC 31.3 30.0 - 36.0 g/dL   RDW 69.6 29.5 - 28.4 %   Platelets 217 150 - 400 K/uL   nRBC 0.0 0.0 - 0.2 %   Neutrophils Relative % 68 %   Neutro Abs 6.1 1.7 - 7.7 K/uL   Lymphocytes Relative 22 %   Lymphs Abs 2.0 0.7 - 4.0 K/uL   Monocytes Relative 9 %   Monocytes Absolute 0.8 0.1 - 1.0 K/uL   Eosinophils Relative 1 %   Eosinophils Absolute 0.1 0.0 - 0.5 K/uL   Basophils Relative 0 %   Basophils Absolute 0.0 0.0 - 0.1 K/uL   Immature Granulocytes 0 %   Abs Immature Granulocytes 0.04 0.00 - 0.07 K/uL    Comment: Performed at [facility], [address], [place], [place] [address]  Troponin I (High Sensitivity)     Status: Abnormal   Collection Time: 03/07/23  9:52 AM  Result Value Ref Range   Troponin I (High Sensitivity) 528 (HH) <18 ng/L    Comment: CRITICAL RESULT CALLED TO, READ BACK BY AND VERIFIED WITH L,MYLES RN @1133  03/07/23 E,BENTON (NOTE) Elevated high sensitivity troponin I (hsTnI) values and significant  changes across serial measurements may suggest ACS but many other  chronic and acute conditions are known to elevate hsTnI results.  Refer to the "Links" section for chest pain algorithms and additional  guidance. Performed at [facility], [address], [place], [place] [address]   TSH     Status: None   Collection Time: 03/07/23  9:52 AM  Result Value Ref Range   TSH 2.160 0.350 - 4.500 uIU/mL    Comment: Performed by a 3rd Generation assay with a functional sensitivity of <=0.01 uIU/mL. Performed at [facility], [address], [place], [place] [address]   T4, free      Status: None   Collection Time: 03/07/23  9:52 AM  Result Value Ref Range   Free T4 0.98 0.61 - 1.12 ng/dL    Comment: (NOTE) Biotin ingestion may interfere with free T4 tests. If the results are inconsistent with the TSH level, previous test results, or the clinical presentation, then consider biotin interference. If needed, order repeat testing after stopping biotin. Performed at [facility], [address], [place], [place] [address]   D-dimer, quantitative     Status: Abnormal   Collection Time: 03/07/23  9:52 AM  Result Value Ref Range   D-Dimer, Quant 3.06 (H) 0.00 - 0.50 ug/mL-FEU    Comment: (NOTE) At the manufacturer cut-off value of 0.5 g/mL FEU, this assay has a negative predictive value of 95-100%.This assay is intended for use in conjunction with a clinical pretest probability (PTP) assessment model to exclude pulmonary embolism (PE) and deep venous thrombosis (DVT) in outpatients suspected of PE or DVT. Results should be correlated with clinical presentation. Performed at [facility], [address] [place], [place] [address]   Troponin I (High Sensitivity)     Status: Abnormal   Collection Time: 03/07/23  1:03 PM  Result Value Ref Range   Troponin I (High Sensitivity) 584 (HH) <18 ng/L    Comment: CRITICAL VALUE NOTED. VALUE IS CONSISTENT WITH PREVIOUSLY REPORTED/CALLED VALUE (NOTE) Elevated high sensitivity troponin I (hsTnI) values and significant  changes across serial measurements may suggest ACS but many other  chronic and acute conditions are known to elevate hsTnI results.  Refer to the "Links" section for chest pain algorithms and additional  guidance. Performed at [facility], [address], [place], [place] [address]    CT Angio Chest PE W and/or Wo Contrast  Result Date: 03/07/2023 CLINICAL DATA:  Shortness of breath.  Elevated D-dimer. EXAM: CT ANGIOGRAPHY CHEST WITH CONTRAST TECHNIQUE: Multidetector CT imaging of the  chest was performed using the standard protocol during bolus administration of intravenous contrast. Multiplanar CT image reconstructions and MIPs were obtained to evaluate the vascular anatomy. RADIATION DOSE REDUCTION: This exam was performed according to the departmental dose-optimization program which includes automated exposure control, adjustment of the mA and/or kV according to patient size and/or use of iterative reconstruction technique. CONTRAST:  75mL OMNIPAQUE IOHEXOL 350 MG/ML SOLN COMPARISON:  11/15/2016 FINDINGS: Cardiovascular: Pulmonary emboli are seen in the central right upper lobe pulmonary artery and segmental branches, an anterior segmental branch in the right lower lobe pulmonary artery, and posterior segmental branches of the left lower lobe pulmonary artery. No evidence of elevated RV/LV ratio. No evidence of thoracic aortic dissection or aneurysm. Contrast reflux into IVC and hepatic veins is consistent with right heart insufficiency. Mediastinum/Nodes: No masses or pathologically enlarged lymph nodes identified. Lungs/Pleura: Small pleural effusions are seen bilaterally with mild dependent atelectasis. No evidence of pulmonary consolidation or mass. Upper abdomen: Hepatic cirrhosis noted. 2.3 cm low-attenuation lesion is seen in the lateral segment of the left hepatic lobe, which cannot be characterized on this study. Musculoskeletal: No suspicious bone lesions identified. Review of the MIP images confirms the above findings. IMPRESSION: Bilateral pulmonary embolism, as described above. No CT evidence of right heart strain. Small bilateral pleural effusions and mild dependent atelectasis. Hepatic cirrhosis. Findings consistent with right heart insufficiency. 2.3 cm low-attenuation lesion in left hepatic lobe, which cannot be characterized on this study. Recommend abdomen MRI without and with contrast for further characterization. Critical Value/emergent results were called by telephone at  the time of interpretation on 03/07/2023 at 12:53 pm to provider Ernie Avena , who verbally acknowledged these results. Electronically Signed   By: Danae Orleans M.D.   On: 03/07/2023 12:57   DG Chest Portable 1 View  Result Date: 03/07/2023 CLINICAL DATA:  Shortness of breath EXAM: PORTABLE CHEST 1 VIEW COMPARISON:  02/24/2020 FINDINGS: Cardiomegaly, vascular congestion. Mild perihilar and infrahilar airspace opacities, likely edema. No effusions or acute bony abnormality. IMPRESSION: Mild CHF. Electronically Signed   By: Charlett Nose M.D.   On: 03/07/2023 10:13    Pending Labs Unresulted Labs (From admission, onward)     Start     Ordered   03/08/23 0500  Heparin level (unfractionated)  Daily,   R     See Hyperspace for full Linked Orders Report.   03/07/23 1229   03/08/23 0500  CBC  Daily,   R     See Hyperspace for full Linked Orders Report.   03/07/23 1229   03/07/23 2100  Heparin level (unfractionated)  Once-Timed,   URGENT        03/07/23 1229   Signed and Held  Comprehensive metabolic panel  Tomorrow morning,   R        Signed and Held   Signed and Held  CBC  Tomorrow morning,   R        Signed and Held            Vitals/Pain Today's Vitals   03/07/23 1500 03/07/23 1515 03/07/23 1530 03/07/23 1545  BP: 104/88 104/81 101/87 102/87  Pulse: 82 93 84 86  Resp: (!) 27 15 (!)  30 20  Temp:      TempSrc:      SpO2: (!) 88% (!) 73% (!) 60% 93%    Isolation Precautions Airborne and Contact precautions  Medications Medications  heparin ADULT infusion 100 units/mL (25000 units/268mL) (1,200 Units/hr Intravenous New Bag/Given 03/07/23 1304)  metoprolol tartrate (LOPRESSOR) injection 5 mg (5 mg Intravenous Given 03/07/23 1036)  iohexol (OMNIPAQUE) 350 MG/ML injection 75 mL (75 mLs Intravenous Contrast Given 03/07/23 1209)  heparin bolus via infusion 5,000 Units (5,000 Units Intravenous Bolus from Bag 03/07/23 1305)  LORazepam (ATIVAN) tablet 1 mg (1 mg Oral Given 03/07/23 1438)     Mobility walks with person assist     Focused Assessments Pulmonary Assessment Handoff:  Lung sounds: Bilateral Breath Sounds: Clear L Breath Sounds: Clear R Breath Sounds: Clear        R Recommendations: See Admitting Provider Note  Report given to:   Additional Notes: Pt came in for SOB. Hx of CHF. Trop and BNP are both elevated. Pt was positive for a PE. Pt has a 20 G in the L AC and a 18 G in the R FA. Currently on heparin at 1200 units. Pt is very HOH and left his hearing aids at home.

## 2023-03-07 NOTE — Assessment & Plan Note (Addendum)
Cognitive impairment.  He had suicidal ideation. Patient was evaluated by psychiatry, and recommended continue medical therapy. He had no longer suicidal thoughts.  - Continue Lexapro

## 2023-03-07 NOTE — H&P (Signed)
History and Physical    Patient: Christian [name] UJW:[id number] DOB: 01-16-1941 DOA: 03/07/2023 DOS: the patient was seen and examined on 03/07/2023 PCP: [name], PA  Patient coming from: Home  Chief Complaint:  Chief Complaint  Patient presents with   Shortness of Breath       HPI:  Christian [name] is an 82 y.o. M with hx sCHF EF 20% (recovered), NICM, stroke, HTN, and DM who presented with SOB over several months, now 2 days severe SOB.  History collected mostly by ex-wife Christian [name], as patient has memory loss.   Had been seen by Cards and about to see Pulm for work up of increasing SOB ex-wife/caregiver had noticed over months.  Cardiology noted low BP and held [place].  On day before admission, ex-wife noticed increased SOB and on day of admission patient appeared pale and winded, so she called EMS.  In the ER, CTA showed new bilateral PE.  Hemodynamically stable.  Chest imaging with mild edema, effusions.    Admitted on heparin.      Review of Systems  Constitutional:  Negative for chills, fever and malaise/fatigue.  Respiratory:  Positive for shortness of breath. Negative for cough, sputum production and wheezing.   Cardiovascular:  Negative for chest pain, orthopnea and leg swelling.  All other systems reviewed and are negative.    Past Medical History:  Diagnosis Date   Allergic rhinitis due to pollen 03/29/2018   Chronic systolic heart failure (HCC) - EF improved to normal on Rx    Echo 11/18: Mild LVH, EF 20, diffuse HK mild AI, a sending aorta 42 mm (mildly dilated), mild MR, mild TR, trivial PI, PASP 44 // Echo 3/19: mild LVH, EF 25-30, diff HK, Gr 1 DD, asc aorta 43 mm (mildly dilated), MAC, mild reduced RVSF // Limited echo 10/19: Mild concentric LVH, EF 60-65, grade 1 diastolic dysfunction, MAC, mild LAE    Coronary artery disease involving native coronary artery of native heart without angina pectoris 11/11/2017   R/L HC 11/18: pLAD 45, oD1 65 ; elevated  PCWP (mean 29), moderate pulmonary hypertension, CO 3.66, CI 2.07   Depression with anxiety 07/08/2017   Essential hypertension 07/08/2017   GERD with esophagitis 01/11/2018   Hernia of abdominal cavity    Hyperlipidemia    Hyperlipidemia LDL goal <130 05/25/2017   The 10-year ASCVD risk score Denman George DC Jr., et al., 2013) is: 31.1%   Values used to calculate the score:     Age: 41 years     Sex: Male     Is Non-Hispanic African American: No     Diabetic: No     Tobacco smoker: No     Systolic Blood Pressure: 140 mmHg     Is BP treated: No     HDL Cholesterol: 68.7 mg/dL     Total Cholesterol: 258 mg/dL    Idiopathic inflammatory myopathy 05/25/2017   Neuropathy involving both lower extremities 05/25/2017   Nonischemic cardiomyopathy (HCC) 11/11/2017   LHC 09/16/17-nonobstructive CAD   Oropharyngeal dysphagia 01/11/2018   Type 2 diabetes mellitus with complication, without long-term current use of insulin (HCC) 07/08/2017   Past Surgical History:  Procedure Laterality Date   BACK SURGERY     HERNIA REPAIR     RIGHT/LEFT HEART CATH AND CORONARY ANGIOGRAPHY N/A 09/16/2017   Procedure: RIGHT/LEFT HEART CATH AND CORONARY ANGIOGRAPHY;  Surgeon: [place], Peter M, MD;  Location: [facility];  Service: Cardiovascular;  Laterality: N/A;   SPINE SURGERY  Social History:  reports that he has never smoked. He has never used smokeless tobacco. He reports that he does not drink alcohol and does not use drugs.  Allergies  Allergen Reactions   Amoxicillin Hives   Bactrim [Sulfamethoxazole-Trimethoprim] Other (See Comments)    Dried out his eyes   Cephalexin Itching   Hydrocodone Itching   Lipitor [Atorvastatin] Other (See Comments)    Damage to calf muscles   Oxycodone Itching   Statins Other (See Comments)    Muscle pain    Vytorin [Ezetimibe-Simvastatin] Other (See Comments)    Damage to calf muscles    Family History  Problem Relation Age of Onset   Stroke Mother    Kidney disease Father     Stroke Father    Hypertension Father    Early death Father    Cancer Neg Hx    Alcohol abuse Neg Hx    Diabetes Neg Hx    Heart disease Neg Hx    Hyperlipidemia Neg Hx     Prior to Admission medications   Medication Sig Start Date End Date Taking? Authorizing Provider  acetaminophen (TYLENOL) 325 MG tablet Take 2 tablets (650 mg total) by mouth every 4 (four) hours as needed for mild pain (or temp > 37.5 C (99.5 F)). 03/07/20   Angiulli, Mcarthur Rossetti, PA-C  albuterol (VENTOLIN HFA) 108 (90 Base) MCG/ACT inhaler Inhale 2 puffs into the lungs every 6 (six) hours as needed for wheezing or shortness of breath. 04/29/22   Janeece Agee, NP  carvedilol (COREG) 3.125 MG tablet TAKE 1 TABLET BY MOUTH DAILY 02/01/21   Nahser, Deloris Ping, MD  cholecalciferol (VITAMIN D) 1000 units tablet Take 1 tablet (1,000 Units total) by mouth daily. 03/07/20   Angiulli, Mcarthur Rossetti, PA-C  clopidogrel (PLAVIX) 75 MG tablet TAKE 1 TABLET BY MOUTH DAILY 12/08/22   Nahser, Deloris Ping, MD  [employer] GAS RELIEF EXTRA STRENGTH 125 MG chewable tablet Chew 125 mg by mouth as needed. 04/02/20   [provider]  escitalopram (LEXAPRO) 20 MG tablet Take 1 tablet (20 mg total) by mouth daily. 10/27/22   [name], PA  esomeprazole (NEXIUM) 40 MG capsule TAKE 1 CAPSULE BY MOUTH DAILY AS NEEDED FOR HEARTBURN 10/27/22   [name], PA  ezetimibe (ZETIA) 10 MG tablet Take 1 tablet (10 mg total) by mouth daily. 04/09/21   Nahser, Deloris Ping, MD  hydrOXYzine (ATARAX) 10 MG tablet Take 1 tablet (10 mg total) by mouth 2 (two) times daily as needed for itching or anxiety. 01/22/23   [name], PA  metFORMIN (GLUCOPHAGE) 500 MG tablet Take 1 tablet (500 mg total) by mouth daily with breakfast. 10/29/22   [name], PA  olopatadine (PATANOL) 0.1 % ophthalmic solution INSTILL 1 DROP INTO BOTH EYS DAILY AS NEEDED FOR ALLERGIES 03/20/20   Janeece Agee, NP  ondansetron (ZOFRAN-ODT) 4 MG disintegrating tablet TAKE 2 TABLETS BY MOUTH  AS NEEDED 12/31/22   [name], PA  rosuvastatin (CRESTOR) 40 MG tablet Take 1 tablet (40 mg total) by mouth daily. 10/29/22   [name], PA  traZODone (DESYREL) 50 MG tablet Take 0.5-1 tablets (25-50 mg total) by mouth at bedtime as needed for sleep. 10/27/22   [name], PA  triamcinolone cream (KENALOG) 0.1 % 1 application Externally once a day, mix w/ itch relief lotion for 30 days    [provider]    Physical Exam: Vitals:   03/07/23 1500 03/07/23 1515 03/07/23 1530 03/07/23 1545  BP:  104/88 104/81 101/87 102/87  Pulse: 82 93 84 86  Resp: (!) 27 15 (!) 30 20  Temp:      TempSrc:      SpO2: (!) 88% (!) 73% (!) 60% 93%   Elderly adult male, sleeping but awakens to voice, somewhat slow in responses, forgetful, but in no acute distress, tachypnea at times Oropharynx tacky dry, no oral lesions, hearing diminished, conjunctiva normal Irregularly irregular, I do not appreciate murmurs, he has mild nonpitting edema in the lower extremities, I cannot see the JVD the Respiratory rate seems increased, lung sounds are diminished bilaterally.  I do not appreciate rales or wheezes Abdomen soft without tenderness palpation or guarding, no ascites or distention He is somewhat inattentive, slowed responses, affect is blunted, judgment seems impaired and forgetful, he has generalized weakness but symmetric strength, face symmetric     Data Reviewed: Pulmonology note reviewed Patient metabolic panel shows stable normal renal function, normal electrolytes CBC shows no leukocytosis or anemia COVID-negative Magnesium normal BNP 2668 Troponin 528 TSH normal Free T4 normal D-dimer elevated CT angiogram of the chest and chest x-ray personally reviewed, this showed  bilateral edema and pleural effusions.  CT angiogram report rules out PE but does show cirrhosis.    Assessment and Plan: * Acute pulmonary embolism without acute cor pulmonale (HCC) CTA chest shows  bilateral PE.  No radiographic heart strain. - Continue heparin - Plan for Eliquis tomorrow pending echo    Acute on chronic systolic CHF (congestive heart failure) (HCC) Presented with SOB, effusions and edema on CXR.  Last EF recovered from 20% (years ago) to 65% most recently. Entresto held at last MD visit. - Lasix 40 mg IV, starting tomorrow - Potassium - Obtain Echo - Continue Coreg    Paroxysmal atrial fibrillation (HCC) CHA2DS2-Vasc 7 (age, CHF, HTN, DM, prior CVA, PVD).   TSH normal - Continue Coreg - Obtain echo - Continue heparin gtt - Transition to Eliquis tomorrow if echo normal - Outpatient cardiology follow up  Mood disorder (HCC) - Continue Lexapro  Cerebrovascular disease See above  Benign essential HTN BP normal - Continue Coreg - Continue Entresto hold  Coronary artery disease involving native coronary artery of native heart without angina pectoris - Stop Plavix, aspirin (given Eliquis) - Continue Zetia - Confirm Crestor before resumption (given history statin intolerance)  Type 2 diabetes mellitus with complication, without long-term current use of insulin (HCC) A1c 7.6% last Jan - SS corrections - Hold metformin  Hyperlipidemia - Continue Zetia - Follow med rec re: Crestor         Advance Care Planning: DNR, confirmed with ex-wife  Consults: None  Family Communication: Ex-wife and caregiver, Christian [name]  Severity of Illness: The appropriate patient status for this patient is INPATIENT. Inpatient status is judged to be reasonable and necessary in order to provide the required intensity of service to ensure the patient's safety. The patient's presenting symptoms, physical exam findings, and initial radiographic and laboratory data in the context of their chronic comorbidities is felt to place them at high risk for further clinical deterioration. Furthermore, it is not anticipated that the patient will be medically stable for discharge  from the hospital within 2 midnights of admission.   * I certify that at the point of admission it is my clinical judgment that the patient will require inpatient hospital care spanning beyond 2 midnights from the point of admission due to high intensity of service, high risk for further deterioration and high frequency  of surveillance required.*  Author: Alberteen Sam, MD 03/07/2023 5:12 PM  For on call review [email or web address].

## 2023-03-07 NOTE — Consult Note (Addendum)
NAME:  Christian [name], MRN:  [id number], DOB:  Jul 05, 1941, LOS: 0 ADMISSION DATE:  03/07/2023, CONSULTATION DATE:  03/07/23 REFERRING MD: Karene Fry, CHIEF COMPLAINT:  SOB   History of Present Illness:  82 year old male with a history of nonischemic cardiomyopathy who is presenting with 6 months of worsening shortness of breath and fatigue.  Recently seen in the cardiology office with a repeat echo ordered but not yet done.  ER workup has revealed a pulmonary embolus with elevated cardiac biomarkers for which pulmonology was consulted.  The patient does note a few month history of immobility.  His blood pressure was initially low but is normalized.  Review of systems as below.  Pertinent  Medical History  History of nonischemic cardiomyopathy with echo and right heart cath numbers listed in the past medical history tab.  Repeat echo showing EF normalization. History of statin associated myopathy GERD Hypertension, hyperlipidemia Diabetes to History of stroke, patchy in right MCA territory per MRI brain read 02/25/2020  Significant Hospital Events: Including procedures, antibiotic start and stop dates in addition to other pertinent events   5/18 admission  Interim History / Subjective:  Consulted  Objective   Blood pressure 98/61, pulse 83, temperature 98 F (36.7 C), resp. rate (!) 26, SpO2 95 %.       No intake or output data in the 24 hours ending 03/07/23 1509 There were no vitals filed for this visit.  Examination: General: Pale chronically ill elderly man lying in bed HENT: He has marked JVD, mucous membranes moist Lungs: Lungs are diminished at bases with faint crackles, no accessory muscle use, mildly tachypneic Cardiovascular: Heart sounds irregular, distal extremities are lukewarm Abdomen: Soft, positive bowel sounds Extremities: He has 1+ edema Neuro: Moves all 4 extremities to command, extremely hard of hearing Psych: Alert and oriented  Troponin elevated, BNP elevated  trops are flat  EKG personally reviewed, tough to tell whether he is in sinus or A-fib.  QRS complexes are regular.  Has a left bundle branch block which is old.  No STEMI  Chest x-ray looks wet  CTA chest personally reviewed and shows bilateral segmental pulmonary emboli, no evidence of RV strain, reflux of contrast into the IVC and hepatic veins consistent with RV overload, bilateral pleural effusions  Resolved Hospital Problem list   Not applicable  Assessment & Plan:  Acute bilateral pulmonary embolus-he has no contraindications to advanced pulmonary embolus therapies.  However, his symptoms, exam, and imaging are more chronic in nature and suspicious for recurrence of systolic heart failure being primary driver.  His BP is chronically low, no syncopal symptoms, no pleurisy, no significant vital sign perturbations from baseline. Left hepatic lobe lesion- abd MRI when able Weight loss, FTT- weight is down 22 lbs over past year per chart review Possible afib  - Would do heparin drip, admission to progressive versus telemetry - Check echocardiogram and consider cardiology consult based on results (can reach back out to me if questions regarding results) - Let kidneys rest today after dye load then consider diuresis - If becomes HD unstable can do TNK half dose given age and hx of stroke - Pulmonology will be available PRN, please reach out if any questions or concerns  Best Practice (right click and "Reselect all SmartList Selections" daily)   Per admitting team  Labs   CBC: Recent Labs  Lab 03/07/23 0952  WBC 9.1  NEUTROABS 6.1  HGB 12.8*  HCT 40.9  MCV 93.2  PLT 217  Basic Metabolic Panel: Recent Labs  Lab 03/07/23 0952  NA 138  K 3.7  CL 102  CO2 25  GLUCOSE 139*  BUN 26*  CREATININE 1.21  CALCIUM 8.3*  MG 1.9   GFR: Estimated Creatinine Clearance: 42.5 mL/min (by C-G formula based on SCr of 1.21 mg/dL). Recent Labs  Lab 03/07/23 0952  WBC 9.1     Liver Function Tests: No results for input(s): "AST", "ALT", "ALKPHOS", "BILITOT", "PROT", "ALBUMIN" in the last 168 hours. No results for input(s): "LIPASE", "AMYLASE" in the last 168 hours. No results for input(s): "AMMONIA" in the last 168 hours.  ABG    Component Value Date/Time   PHART 7.411 09/16/2017 1124   PCO2ART 44.7 09/16/2017 1124   PO2ART 122.0 (H) 09/16/2017 1124   HCO3 28.4 (H) 09/16/2017 1124   TCO2 30 09/16/2017 1124   O2SAT 99.0 09/16/2017 1124     Coagulation Profile: No results for input(s): "INR", "PROTIME" in the last 168 hours.  Cardiac Enzymes: No results for input(s): "CKTOTAL", "CKMB", "CKMBINDEX", "TROPONINI" in the last 168 hours.  HbA1C: Hemoglobin A1C  Date/Time Value Ref Range Status  07/09/2016 12:00 AM 5.8  Final   Hgb A1c MFr Bld  Date/Time Value Ref Range Status  10/27/2022 02:54 PM 7.6 (H) 4.6 - 6.5 % Final    Comment:    Glycemic Control Guidelines for People with Diabetes:Non Diabetic:  <6%Goal of Therapy: <7%Additional Action Suggested:  >8%   04/29/2022 01:02 PM 7.5 (H) 4.6 - 6.5 % Final    Comment:    Glycemic Control Guidelines for People with Diabetes:Non Diabetic:  <6%Goal of Therapy: <7%Additional Action Suggested:  >8%     CBG: No results for input(s): "GLUCAP" in the last 168 hours.  Review of Systems:    Positive Symptoms in bold:  Constitutional fevers, chills, weight loss, fatigue, anorexia, malaise  Eyes decreased vision, double vision, eye irritation  Ears, Nose, Mouth, Throat sore throat, trouble swallowing, sinus congestion  Cardiovascular chest pain, paroxysmal nocturnal dyspnea, lower ext edema, palpitations   Respiratory SOB, cough, DOE, hemoptysis, wheezing  Gastrointestinal nausea, vomiting, diarrhea  Genitourinary burning with urination, trouble urinating  Musculoskeletal joint aches, joint swelling, back pain  Integumentary  rashes, skin lesions  Neurological focal weakness, focal numbness,  trouble speaking, headaches  Psychiatric depression, anxiety, confusion  Endocrine polyuria, polydipsia, cold intolerance, heat intolerance  Hematologic abnormal bruising, abnormal bleeding, unexplained nose bleeds  Allergic/Immunologic recurrent infections, hives, swollen lymph nodes     Past Medical History:  He,  has a past medical history of Allergic rhinitis due to pollen (03/29/2018), Chronic systolic heart failure (HCC) - EF improved to normal on Rx, Coronary artery disease involving native coronary artery of native heart without angina pectoris (11/11/2017), Depression with anxiety (07/08/2017), Essential hypertension (07/08/2017), GERD with esophagitis (01/11/2018), Hernia of abdominal cavity, Hyperlipidemia, Hyperlipidemia LDL goal <130 (05/25/2017), Idiopathic inflammatory myopathy (05/25/2017), Neuropathy involving both lower extremities (05/25/2017), Nonischemic cardiomyopathy (HCC) (11/11/2017), Oropharyngeal dysphagia (01/11/2018), and Type 2 diabetes mellitus with complication, without long-term current use of insulin (HCC) (07/08/2017).   Surgical History:   Past Surgical History:  Procedure Laterality Date   BACK SURGERY     HERNIA REPAIR     RIGHT/LEFT HEART CATH AND CORONARY ANGIOGRAPHY N/A 09/16/2017   Procedure: RIGHT/LEFT HEART CATH AND CORONARY ANGIOGRAPHY;  Surgeon: Sw[place], Peter M, MD;  Location: [facility];  Service: Cardiovascular;  Laterality: N/A;   SPINE SURGERY       Social History:  reports that he has never smoked. He has never used smokeless tobacco. He reports that he does not drink alcohol and does not use drugs.   Family History:  His family history includes Early death in his father; Hypertension in his father; Kidney disease in his father; Stroke in his father and mother. There is no history of Cancer, Alcohol abuse, Diabetes, Heart disease, or Hyperlipidemia.   Allergies Allergies  Allergen Reactions   Amoxicillin Hives   Bactrim  [Sulfamethoxazole-Trimethoprim] Other (See Comments)    Dried out his eyes   Cephalexin Itching   Hydrocodone Itching   Lipitor [Atorvastatin] Other (See Comments)    Damage to calf muscles   Oxycodone Itching   Statins Other (See Comments)    Muscle pain    Vytorin [Ezetimibe-Simvastatin] Other (See Comments)    Damage to calf muscles     Home Medications  Prior to Admission medications   Medication Sig Start Date End Date Taking? Authorizing Provider  acetaminophen (TYLENOL) 325 MG tablet Take 2 tablets (650 mg total) by mouth every 4 (four) hours as needed for mild pain (or temp > 37.5 C (99.5 F)). 03/07/20   Angiulli, Mcarthur Rossetti, PA-C  albuterol (VENTOLIN HFA) 108 (90 Base) MCG/ACT inhaler Inhale 2 puffs into the lungs every 6 (six) hours as needed for wheezing or shortness of breath. 04/29/22   Janeece Agee, NP  carvedilol (COREG) 3.125 MG tablet TAKE 1 TABLET BY MOUTH DAILY 02/01/21   Nahser, Deloris Ping, MD  cholecalciferol (VITAMIN D) 1000 units tablet Take 1 tablet (1,000 Units total) by mouth daily. 03/07/20   Angiulli, Mcarthur Rossetti, PA-C  clopidogrel (PLAVIX) 75 MG tablet TAKE 1 TABLET BY MOUTH DAILY 12/08/22   Nahser, Deloris Ping, MD  [employer] GAS RELIEF EXTRA STRENGTH 125 MG chewable tablet Chew 125 mg by mouth as needed. 04/02/20   [provider]  escitalopram (LEXAPRO) 20 MG tablet Take 1 tablet (20 mg total) by mouth daily. 10/27/22   [name], PA  esomeprazole (NEXIUM) 40 MG capsule TAKE 1 CAPSULE BY MOUTH DAILY AS NEEDED FOR HEARTBURN 10/27/22   [name], PA  ezetimibe (ZETIA) 10 MG tablet Take 1 tablet (10 mg total) by mouth daily. 04/09/21   Nahser, Deloris Ping, MD  hydrOXYzine (ATARAX) 10 MG tablet Take 1 tablet (10 mg total) by mouth 2 (two) times daily as needed for itching or anxiety. 01/22/23   [name], PA  metFORMIN (GLUCOPHAGE) 500 MG tablet Take 1 tablet (500 mg total) by mouth daily with breakfast. 10/29/22   [name], PA  olopatadine (PATANOL) 0.1  % ophthalmic solution INSTILL 1 DROP INTO BOTH EYS DAILY AS NEEDED FOR ALLERGIES 03/20/20   Janeece Agee, NP  ondansetron (ZOFRAN-ODT) 4 MG disintegrating tablet TAKE 2 TABLETS BY MOUTH AS NEEDED 12/31/22   [name], PA  rosuvastatin (CRESTOR) 40 MG tablet Take 1 tablet (40 mg total) by mouth daily. 10/29/22   [name], PA  traZODone (DESYREL) 50 MG tablet Take 0.5-1 tablets (25-50 mg total) by mouth at bedtime as needed for sleep. 10/27/22   [name], PA  triamcinolone cream (KENALOG) 0.1 % 1 application Externally once a day, mix w/ itch relief lotion for 30 days    [provider]     Critical care time: N/A

## 2023-03-07 NOTE — Assessment & Plan Note (Addendum)
Continue rate control with amiodarone Anticoagulation with apixaban.   

## 2023-03-07 NOTE — ED Notes (Signed)
Pt gone to vascular ? ?

## 2023-03-07 NOTE — Assessment & Plan Note (Addendum)
Echocardiogram with decreased LV systolic function to <20%, global hypokinesis, internal cavity with mild dilatation, mild LVH, RV with moderate reduction in systolic function. Mild to moderate MR. Suspect low gradient moderate aortic stenosis.   Low flow aortic stenosis, not candidate for TAVR due to cognitive impairment.   Patient with low output heart failure and required dobutamine infusion.  His was placed on IV furosemide for diuresis, negative fluid balance was achieved, -11,733 ml with significant improvement in his symptoms.  Continue diuresis with oral furosemide and spironolactone.  Limited pharmacologic therapy due to hypotension.

## 2023-03-07 NOTE — Assessment & Plan Note (Deleted)
See above

## 2023-03-08 DIAGNOSIS — I2699 Other pulmonary embolism without acute cor pulmonale: Secondary | ICD-10-CM

## 2023-03-08 DIAGNOSIS — I10 Essential (primary) hypertension: Secondary | ICD-10-CM | POA: Diagnosis not present

## 2023-03-08 DIAGNOSIS — I480 Paroxysmal atrial fibrillation: Secondary | ICD-10-CM

## 2023-03-08 DIAGNOSIS — I2510 Atherosclerotic heart disease of native coronary artery without angina pectoris: Secondary | ICD-10-CM

## 2023-03-08 DIAGNOSIS — E785 Hyperlipidemia, unspecified: Secondary | ICD-10-CM

## 2023-03-08 DIAGNOSIS — F39 Unspecified mood [affective] disorder: Secondary | ICD-10-CM

## 2023-03-08 DIAGNOSIS — I5023 Acute on chronic systolic (congestive) heart failure: Secondary | ICD-10-CM

## 2023-03-08 DIAGNOSIS — E118 Type 2 diabetes mellitus with unspecified complications: Secondary | ICD-10-CM

## 2023-03-08 DIAGNOSIS — I679 Cerebrovascular disease, unspecified: Secondary | ICD-10-CM

## 2023-03-08 LAB — CBC
HCT: 38.3 % — ABNORMAL LOW (ref 39.0–52.0)
Hemoglobin: 11.9 g/dL — ABNORMAL LOW (ref 13.0–17.0)
MCH: 28.5 pg (ref 26.0–34.0)
MCHC: 31.1 g/dL (ref 30.0–36.0)
MCV: 91.8 fL (ref 80.0–100.0)
Platelets: 184 10*3/uL (ref 150–400)
RBC: 4.17 MIL/uL — ABNORMAL LOW (ref 4.22–5.81)
RDW: 14.9 % (ref 11.5–15.5)
WBC: 6.6 10*3/uL (ref 4.0–10.5)
nRBC: 0 % (ref 0.0–0.2)

## 2023-03-08 LAB — COMPREHENSIVE METABOLIC PANEL
ALT: 16 U/L (ref 0–44)
AST: 20 U/L (ref 15–41)
Albumin: 3.1 g/dL — ABNORMAL LOW (ref 3.5–5.0)
Alkaline Phosphatase: 41 U/L (ref 38–126)
Anion gap: 12 (ref 5–15)
BUN: 19 mg/dL (ref 8–23)
CO2: 22 mmol/L (ref 22–32)
Calcium: 8.2 mg/dL — ABNORMAL LOW (ref 8.9–10.3)
Chloride: 102 mmol/L (ref 98–111)
Creatinine, Ser: 1.09 mg/dL (ref 0.61–1.24)
GFR, Estimated: >60 mL/min (ref >60)
Glucose, Bld: 90 mg/dL (ref 70–99)
Potassium: 3.4 mmol/L — ABNORMAL LOW (ref 3.5–5.1)
Sodium: 136 mmol/L (ref 135–145)
Total Bilirubin: 1.6 mg/dL — ABNORMAL HIGH (ref 0.3–1.2)
Total Protein: 5.2 g/dL — ABNORMAL LOW (ref 6.5–8.1)

## 2023-03-08 LAB — GLUCOSE, CAPILLARY
Glucose-Capillary: 63 mg/dL — ABNORMAL LOW (ref 70–99)
Glucose-Capillary: 117 mg/dL — ABNORMAL HIGH (ref 70–99)
Glucose-Capillary: 152 mg/dL — ABNORMAL HIGH (ref 70–99)
Glucose-Capillary: 92 mg/dL (ref 70–99)

## 2023-03-08 LAB — HEPARIN LEVEL (UNFRACTIONATED): Heparin Unfractionated: 0.72 IU/mL — ABNORMAL HIGH (ref 0.30–0.70)

## 2023-03-08 LAB — TROPONIN I (HIGH SENSITIVITY): Troponin I (High Sensitivity): 527 ng/L (ref <18)

## 2023-03-08 MED ORDER — APIXABAN 5 MG PO TABS
5.0000 mg | ORAL_TABLET | Freq: Two times a day (BID) | ORAL | Status: DC
  Administered 2023-03-15 – 2023-03-21 (×13): 5 mg via ORAL
  Filled 2023-03-08 (×13): qty 1

## 2023-03-08 MED ORDER — FAMOTIDINE 20 MG PO TABS
20.0000 mg | ORAL_TABLET | Freq: Once | ORAL | Status: AC
  Administered 2023-03-08: 20 mg via ORAL
  Filled 2023-03-08: qty 1

## 2023-03-08 MED ORDER — APIXABAN 5 MG PO TABS
10.0000 mg | ORAL_TABLET | Freq: Two times a day (BID) | ORAL | Status: AC
  Administered 2023-03-08 – 2023-03-14 (×14): 10 mg via ORAL
  Filled 2023-03-08 (×14): qty 2

## 2023-03-08 MED ORDER — CALCIUM CARBONATE ANTACID 500 MG PO CHEW
1.0000 | CHEWABLE_TABLET | Freq: Three times a day (TID) | ORAL | Status: DC | PRN
  Administered 2023-03-08 – 2023-03-19 (×3): 200 mg via ORAL
  Filled 2023-03-08 (×4): qty 1

## 2023-03-08 NOTE — Progress Notes (Signed)
ANTICOAGULATION CONSULT NOTE  Pharmacy Consult for Heparin Indication: pulmonary embolus  Allergies  Allergen Reactions   Amoxicillin Hives   Bactrim [Sulfamethoxazole-Trimethoprim] Other (See Comments)    Dried out his eyes   Cephalexin Itching   Hydrocodone Itching   Lipitor [Atorvastatin] Other (See Comments)    Damage to calf muscles   Oxycodone Itching   Statins Other (See Comments)    Muscle pain    Vytorin [Ezetimibe-Simvastatin] Other (See Comments)    Damage to calf muscles    Patient Measurements: Weight: 71.5 kg (157 lb 10.1 oz) Heparin Dosing Weight: TBW  Vital Signs: Temp: 97 F (36.1 C) (05/18 2046) Temp Source: Oral (05/18 2046) BP: 103/72 (05/19 0023) Pulse Rate: 79 (05/19 0023)  Labs: Recent Labs    03/07/23 0952 03/07/23 1303 03/07/23 2133 03/08/23 0231  HGB 12.8*  --   --  11.9*  HCT 40.9  --   --  38.3*  PLT 217  --   --  184  HEPARINUNFRC  --   --  0.50 0.72*  CREATININE 1.21  --   --   --   TROPONINIHS 528* 584*  --   --      Estimated Creatinine Clearance: 42.5 mL/min (by C-G formula based on SCr of 1.21 mg/dL).   Medical History: Past Medical History:  Diagnosis Date   Allergic rhinitis due to pollen 03/29/2018   Chronic systolic heart failure (HCC) - EF improved to normal on Rx    Echo 11/18: Mild LVH, EF 20, diffuse HK mild AI, a sending aorta 42 mm (mildly dilated), mild MR, mild TR, trivial PI, PASP 44 // Echo 3/19: mild LVH, EF 25-30, diff HK, Gr 1 DD, asc aorta 43 mm (mildly dilated), MAC, mild reduced RVSF // Limited echo 10/19: Mild concentric LVH, EF 60-65, grade 1 diastolic dysfunction, MAC, mild LAE    Coronary artery disease involving native coronary artery of native heart without angina pectoris 11/11/2017   R/L HC 11/18: pLAD 45, oD1 65 ; elevated PCWP (mean 29), moderate pulmonary hypertension, CO 3.66, CI 2.07   Depression with anxiety 07/08/2017   Essential hypertension 07/08/2017   GERD with esophagitis 01/11/2018    Hernia of abdominal cavity    Hyperlipidemia    Hyperlipidemia LDL goal <130 05/25/2017   The 10-year ASCVD risk score Denman George DC Jr., et al., 2013) is: 31.1%   Values used to calculate the score:     Age: 82 years     Sex: Male     Is Non-Hispanic African American: No     Diabetic: No     Tobacco smoker: No     Systolic Blood Pressure: 140 mmHg     Is BP treated: No     HDL Cholesterol: 68.7 mg/dL     Total Cholesterol: 258 mg/dL    Idiopathic inflammatory myopathy 05/25/2017   Neuropathy involving both lower extremities 05/25/2017   Nonischemic cardiomyopathy (HCC) 11/11/2017   LHC 09/16/17-nonobstructive CAD   Oropharyngeal dysphagia 01/11/2018   Type 2 diabetes mellitus with complication, without long-term current use of insulin (HCC) 07/08/2017    Assessment: 82 YOM presenting with SOB, CT angio chest with multiple bilateral segmental PE. No anticoagulation prior to admission. Pharmacy consulted for heparin.     Heparin level 0.72 is therapeutic on 1200 units/hr. No problems with infusion or bleeding per RN. CBC is stable.  Goal of Therapy:  Heparin level 0.3-0.7 units/ml Monitor platelets by anticoagulation protocol: Yes   Plan:  Decrease  heparin drip to 1100 units/hr 8h heparin level Monitor daily heparin level, CBC Monitor for signs/symptoms of bleeding  F/u long term AC plan  Thank you for involving pharmacy in this patient's care.  Loura Back, PharmD, BCPS Clinical Pharmacist 03/08/2023 3:36 AM

## 2023-03-08 NOTE — Discharge Instructions (Signed)
Information on my medicine - ELIQUIS (apixaban)  Why was Eliquis prescribed for you? Eliquis was prescribed to treat blood clots that may have been found in the veins of your legs (deep vein thrombosis) or in your lungs (pulmonary embolism) and to reduce the risk of them occurring again.  What do You need to know about Eliquis ? The starting dose is 10 mg (two 5 mg tablets) taken TWICE daily for the FIRST SEVEN (7) DAYS, then on 03/16/23  the dose is reduced to ONE 5 mg tablet taken TWICE daily.  Eliquis may be taken with or without food.   Try to take the dose about the same time in the morning and in the evening. If you have difficulty swallowing the tablet whole please discuss with your pharmacist how to take the medication safely.  Take Eliquis exactly as prescribed and DO NOT stop taking Eliquis without talking to the doctor who prescribed the medication.  Stopping may increase your risk of developing a new blood clot.  Refill your prescription before you run out.  After discharge, you should have regular check-up appointments with your healthcare provider that is prescribing your Eliquis.    What do you do if you miss a dose? If a dose of ELIQUIS is not taken at the scheduled time, take it as soon as possible on the same day and twice-daily administration should be resumed. The dose should not be doubled to make up for a missed dose.  Important Safety Information A possible side effect of Eliquis is bleeding. You should call your healthcare provider right away if you experience any of the following: Bleeding from an injury or your nose that does not stop. Unusual colored urine (red or dark brown) or unusual colored stools (red or black). Unusual bruising for unknown reasons. A serious fall or if you hit your head (even if there is no bleeding).  Some medicines may interact with Eliquis and might increase your risk of bleeding or clotting while on Eliquis. To help avoid this,  consult your healthcare provider or pharmacist prior to using any new prescription or non-prescription medications, including herbals, vitamins, non-steroidal anti-inflammatory drugs (NSAIDs) and supplements.  This website has more information on Eliquis (apixaban): http://www.eliquis.com/eliquis/home  

## 2023-03-08 NOTE — Progress Notes (Signed)
Progress Note   Patient: Christian [name]:[id number] DOB: Mar 12, 1941 DOA: 03/07/2023     1 DOS: the patient was seen and examined on 03/08/2023 at 9:36AM      Brief hospital course: Christian [name] is an 82 y.o. M with hx sCHF EF 20% (recovered to 60-65% on 2021 echo), NICM, stroke, HTN, and DM who presented with SOB over several months, now 2 days severe SOB --> bilateral PE and CHF.     5/18: Admitted on heparin 5/19: Lasix IV started for CHF, echo shows EF back down to <20%     Assessment and Plan: * Acute pulmonary embolism without acute cor pulmonale (HCC) CTA chest shows bilateral PE.  No radiographic heart strain.  Echo shows moderate RVSD, most likely from his dilated cardiomyopathy, not the PE per se. Remains hemodynamically stable, no indication for lytics. - Stop heparin - Start Eliquis    Acute on chronic systolic CHF (congestive heart failure) (HCC) Presented with SOB, effusions and edema on CXR.  In 2019 admitted for CHF, had EF down to 25%, started on GDMT and EF recovered to 65%, and was there at most recent echo 2021.  I presume his DOE and lower BP the last few months is a reflection of the EF going down again.   *Note: dispense history for his Coreg (and several other home medicines) implies he has not had these filled in months - IV Lasix  - Potassium - Continue Coreg - BP too low for spironolactone and Entresto     Paroxysmal atrial fibrillation (HCC) This is new onset.  TSH normal. Echo without valvular disease. CHA2DS2-Vasc 7 (age, CHF, HTN, DM, prior CVA, PVD).    - Continue Coreg - Eliquis  Mood disorder (HCC) - Continue Lexapro  Cerebrovascular disease See above  Benign essential HTN BP soft - Continue Coreg  Coronary artery disease involving native coronary artery of native heart without angina pectoris - Stop Plavix, aspirin (given Eliquis) - Continue Zetia   Type 2 diabetes mellitus with complication, without long-term current use  of insulin (HCC) A1c 7.6% last Jan - SS corrections - Hold metformin  Hyperlipidemia - Continue Zetia          Subjective: Patient with tachycardia and abdominal bloating this morning.  Had large BM and this resolved.  No fever, breathing stable, still on O2, started lasix today.       Physical Exam: BP (!) 103/58 (BP Location: Left Arm)   Pulse 84   Temp 98 F (36.7 C) (Oral)   Resp 19   Wt 72.6 kg   SpO2 91%   BMI 25.83 kg/m   Elderly adult male, lying in bed, interactive and very hard of hearing, slowed responses Irregularly irregular, rate controlled, no murmurs, mild lower extremity edema Respiratory rate normal, lung sounds diminished, no rales or wheezes Abdomen soft without tenderness palpation or guarding, no ascites or distention He is very hard of hearing, responses are slowed, he is very impaired and forgetful, has generalized weakness, symmetric strength, face symmetric    Data Reviewed: Echocardiogram shows EF less than 20%, moderate right ventricular dysfunction, moderate AAS Potassium 3.4 Sodium and creatinine normal CBC normal  Family Communication: Ex-wife Christian [name] at the bedside    Disposition: Status is: Inpatient Patient was admitted with CHF and atrial fibrillation  The rate has been controlled, we have started diuretics today.  He will need PT and OT and likely SNF, I will probably consult cardiology tomorrow morning for reduced EF  Author: Alberteen Sam, MD 03/08/2023 2:27 PM  For on call review [email or web address].

## 2023-03-09 ENCOUNTER — Other Ambulatory Visit: Payer: Self-pay

## 2023-03-09 ENCOUNTER — Other Ambulatory Visit (HOSPITAL_COMMUNITY): Payer: Self-pay

## 2023-03-09 ENCOUNTER — Telehealth (HOSPITAL_COMMUNITY): Payer: Self-pay

## 2023-03-09 DIAGNOSIS — I2609 Other pulmonary embolism with acute cor pulmonale: Secondary | ICD-10-CM | POA: Diagnosis not present

## 2023-03-09 DIAGNOSIS — I5023 Acute on chronic systolic (congestive) heart failure: Secondary | ICD-10-CM | POA: Diagnosis not present

## 2023-03-09 DIAGNOSIS — I10 Essential (primary) hypertension: Secondary | ICD-10-CM | POA: Diagnosis not present

## 2023-03-09 DIAGNOSIS — I679 Cerebrovascular disease, unspecified: Secondary | ICD-10-CM | POA: Diagnosis not present

## 2023-03-09 DIAGNOSIS — I480 Paroxysmal atrial fibrillation: Secondary | ICD-10-CM | POA: Diagnosis not present

## 2023-03-09 DIAGNOSIS — I2699 Other pulmonary embolism without acute cor pulmonale: Secondary | ICD-10-CM | POA: Diagnosis not present

## 2023-03-09 DIAGNOSIS — I5021 Acute systolic (congestive) heart failure: Secondary | ICD-10-CM

## 2023-03-09 DIAGNOSIS — I2510 Atherosclerotic heart disease of native coronary artery without angina pectoris: Secondary | ICD-10-CM | POA: Diagnosis not present

## 2023-03-09 DIAGNOSIS — K769 Liver disease, unspecified: Secondary | ICD-10-CM | POA: Diagnosis present

## 2023-03-09 LAB — CBC
HCT: 37.5 % — ABNORMAL LOW (ref 39.0–52.0)
Hemoglobin: 11.6 g/dL — ABNORMAL LOW (ref 13.0–17.0)
MCH: 28.6 pg (ref 26.0–34.0)
MCHC: 30.9 g/dL (ref 30.0–36.0)
MCV: 92.4 fL (ref 80.0–100.0)
Platelets: 172 10*3/uL (ref 150–400)
RBC: 4.06 MIL/uL — ABNORMAL LOW (ref 4.22–5.81)
RDW: 14.9 % (ref 11.5–15.5)
WBC: 6 10*3/uL (ref 4.0–10.5)
nRBC: 0 % (ref 0.0–0.2)

## 2023-03-09 LAB — GLUCOSE, CAPILLARY
Glucose-Capillary: 104 mg/dL — ABNORMAL HIGH (ref 70–99)
Glucose-Capillary: 106 mg/dL — ABNORMAL HIGH (ref 70–99)
Glucose-Capillary: 119 mg/dL — ABNORMAL HIGH (ref 70–99)
Glucose-Capillary: 149 mg/dL — ABNORMAL HIGH (ref 70–99)

## 2023-03-09 LAB — LACTIC ACID, PLASMA: Lactic Acid, Venous: 1.8 mmol/L (ref 0.5–1.9)

## 2023-03-09 LAB — BASIC METABOLIC PANEL
Anion gap: 10 (ref 5–15)
BUN: 20 mg/dL (ref 8–23)
CO2: 25 mmol/L (ref 22–32)
Calcium: 8.1 mg/dL — ABNORMAL LOW (ref 8.9–10.3)
Chloride: 103 mmol/L (ref 98–111)
Creatinine, Ser: 1.18 mg/dL (ref 0.61–1.24)
GFR, Estimated: >60 mL/min (ref >60)
Glucose, Bld: 118 mg/dL — ABNORMAL HIGH (ref 70–99)
Potassium: 3.6 mmol/L (ref 3.5–5.1)
Sodium: 138 mmol/L (ref 135–145)

## 2023-03-09 MED ORDER — ALPRAZOLAM 0.5 MG PO TABS
0.5000 mg | ORAL_TABLET | Freq: Once | ORAL | Status: AC
  Administered 2023-03-14: 0.5 mg via ORAL
  Filled 2023-03-09: qty 1

## 2023-03-09 MED ORDER — SODIUM CHLORIDE 0.9% FLUSH: 10.0000 mL | INTRAVENOUS | Status: DC | PRN

## 2023-03-09 MED ORDER — DOBUTAMINE NICU 2 MG/ML IV INFUSION =/>1.5 KG (25 ML) - SIMPLE MED: 2.5000 ug/kg/min | INTRAVENOUS | Status: DC

## 2023-03-09 MED ORDER — ROSUVASTATIN CALCIUM 20 MG PO TABS
40.0000 mg | ORAL_TABLET | Freq: Every day | ORAL | Status: DC
  Administered 2023-03-09 – 2023-03-21 (×13): 40 mg via ORAL
  Filled 2023-03-09 (×13): qty 2

## 2023-03-09 MED ORDER — SODIUM CHLORIDE 0.9% FLUSH
10.0000 mL | Freq: Two times a day (BID) | INTRAVENOUS | Status: DC
  Administered 2023-03-10 (×2): 10 mL
  Administered 2023-03-11: 20 mL
  Administered 2023-03-11 – 2023-03-17 (×12): 10 mL

## 2023-03-09 MED ORDER — FUROSEMIDE 10 MG/ML IJ SOLN
80.0000 mg | Freq: Once | INTRAMUSCULAR | Status: AC
  Administered 2023-03-09: 80 mg via INTRAVENOUS
  Filled 2023-03-09: qty 8

## 2023-03-09 MED ORDER — POTASSIUM CHLORIDE CRYS ER 20 MEQ PO TBCR
40.0000 meq | EXTENDED_RELEASE_TABLET | Freq: Once | ORAL | Status: AC
  Administered 2023-03-09: 40 meq via ORAL
  Filled 2023-03-09: qty 2

## 2023-03-09 MED ORDER — DOBUTAMINE-DEXTROSE 4-5 MG/ML-% IV SOLN
1.5000 ug/kg/min | INTRAVENOUS | Status: DC
  Administered 2023-03-09: 2.5 ug/kg/min via INTRAVENOUS
  Filled 2023-03-09: qty 250

## 2023-03-09 MED ORDER — CHLORHEXIDINE GLUCONATE CLOTH 2 % EX PADS
6.0000 | MEDICATED_PAD | Freq: Every day | CUTANEOUS | Status: DC
  Administered 2023-03-10 – 2023-03-21 (×12): 6 via TOPICAL

## 2023-03-09 NOTE — Progress Notes (Signed)
[facility] night cross cover note:   I was notified by RN that this patient was hospitalized with acute on chronic systolic heart failure, who was just started on continuous weight base dobutamine by cards a few hours ago, as heart rates in the range of the low 100s to 150s, which appears irregular on the monitor BP 115/87. Has new afib noted during this hospitalization, on coreg. No new symptoms are this time, including no worsening sob, and no chest pain. For now will continue to treat underlying acute on chronic systolic heart failure as a major feature leading to the patient's atrial fibrillation with RVR, including ongoing IV diuresis with assistance from cardiology.  Will also continue existing dobutamine for now, per cardiology recs, and closely monitor ensuing heart rate. Will continue existing coreg dose for now, and I've ordered prn iv lopressor for sustained HR > 130 bpm.  Additionally, RN conveys that pt has  postvoid residual scan of greater than 800 cc.  I placed order for straight cath x 1 now.  Update: straight cath yielded approximately 1 L of urine. Shortly thereafter patient's afib converted to SR.    Update: patient again complaining of urge to urinate but inability to void. Bladder scan shows 850 cc's. I ordered a second straight cath at this time.     Newton Pigg, DO Hospitalist

## 2023-03-09 NOTE — Consult Note (Addendum)
Advanced Heart Failure Team Consult Note   Primary Physician: [name], PA PCP-Cardiologist:  Kristeen Miss, MD  Reason for Consultation: Acute on chronic systolic CHF with RV failure  HPI:    Christian [name] is seen today for evaluation of acute on chronic systolic CHF at the request of Dr. Izora Ribas with [facility]. 82 y.o. male with history of NICM/chronic systolic CHF with recovered EF, nonobstructive CAD, DM II, CVA 2021, HTN, HLD, carotid artery disease, GERD.   Saw cardiology for follow-up 05/06. Entresto stopped d/t hypotension. Noted shortness of breath and fatigue. Echo ordered.  Presented 03/07/23 with worsening dyspnea. BNP 2668. HS troponin elevated with flat trend, [phone] CTA chest with bilateral pulmonary emboli, right heart strain not reported, evidence of hepatic cirrhosis with 2.3 cm low-attenuation lesion left hepatic lobe. CXR with evidence of CHF. Echo with EF < 20%, moderately reduced RV, mild to moderate MR, low flow low gradient moderate AS with mean gradient 6 mmHg, AVA 1 cm2, DI 0.26, very low SV index. He was started on heparin gtt and IV lasix. Cardiology consulted.  Given low blood pressures and cool extremities there was concern for low-output CHF. Lactic acid ordered. Advanced HF asked to evaluate.  Ex-wife, [name], provides history as patient has memory loss. She reports gradual cognitive decline since his stroke in 2021. He lives next door to her. At baseline, he can walk around the grocery store and takes care of his dogs. Needs assistance with meds and some chores.   He's had worsening dyspnea with exertion for about a month and at times dyspnea at rest. He's been nauseated with poor appetite for several months, worse the last few weeks. Has lost over 20 lb this past year.   Home Medications Prior to Admission medications   Medication Sig Start Date End Date Taking? Authorizing Provider  acetaminophen (TYLENOL) 325 MG tablet Take 2  tablets (650 mg total) by mouth every 4 (four) hours as needed for mild pain (or temp > 37.5 C (99.5 F)). 03/07/20   Angiulli, Mcarthur Rossetti, PA-C  albuterol (VENTOLIN HFA) 108 (90 Base) MCG/ACT inhaler Inhale 2 puffs into the lungs every 6 (six) hours as needed for wheezing or shortness of breath. 04/29/22   Janeece Agee, NP  carvedilol (COREG) 3.125 MG tablet TAKE 1 TABLET BY MOUTH DAILY 02/01/21   Nahser, Deloris Ping, MD  cholecalciferol (VITAMIN D) 1000 units tablet Take 1 tablet (1,000 Units total) by mouth daily. 03/07/20   Angiulli, Mcarthur Rossetti, PA-C  clopidogrel (PLAVIX) 75 MG tablet TAKE 1 TABLET BY MOUTH DAILY 12/08/22   Nahser, Deloris Ping, MD  [employer] GAS RELIEF EXTRA STRENGTH 125 MG chewable tablet Chew 125 mg by mouth as needed. 04/02/20   [provider]  escitalopram (LEXAPRO) 20 MG tablet Take 1 tablet (20 mg total) by mouth daily. 10/27/22   [name], PA  esomeprazole (NEXIUM) 40 MG capsule TAKE 1 CAPSULE BY MOUTH DAILY AS NEEDED FOR HEARTBURN 10/27/22   [name], PA  ezetimibe (ZETIA) 10 MG tablet Take 1 tablet (10 mg total) by mouth daily. 04/09/21   Nahser, Deloris Ping, MD  hydrOXYzine (ATARAX) 10 MG tablet Take 1 tablet (10 mg total) by mouth 2 (two) times daily as needed for itching or anxiety. 01/22/23   [name], PA  metFORMIN (GLUCOPHAGE) 500 MG tablet Take 1 tablet (500 mg total) by mouth daily with breakfast. 10/29/22   [name], PA  olopatadine (PATANOL) 0.1 % ophthalmic solution INSTILL 1 DROP  INTO BOTH EYS DAILY AS NEEDED FOR ALLERGIES 03/20/20   Janeece Agee, NP  ondansetron (ZOFRAN-ODT) 4 MG disintegrating tablet TAKE 2 TABLETS BY MOUTH AS NEEDED 12/31/22   [name], PA  rosuvastatin (CRESTOR) 40 MG tablet Take 1 tablet (40 mg total) by mouth daily. 10/29/22   [name], PA  traZODone (DESYREL) 50 MG tablet Take 0.5-1 tablets (25-50 mg total) by mouth at bedtime as needed for sleep. 10/27/22   [name], PA  triamcinolone cream (KENALOG) 0.1  % 1 application Externally once a day, mix w/ itch relief lotion for 30 days    [provider]    Past Medical History: Past Medical History:  Diagnosis Date   Allergic rhinitis due to pollen 03/29/2018   Chronic systolic heart failure (HCC) - EF improved to normal on Rx    Echo 11/18: Mild LVH, EF 20, diffuse HK mild AI, a sending aorta 42 mm (mildly dilated), mild MR, mild TR, trivial PI, PASP 44 // Echo 3/19: mild LVH, EF 25-30, diff HK, Gr 1 DD, asc aorta 43 mm (mildly dilated), MAC, mild reduced RVSF // Limited echo 10/19: Mild concentric LVH, EF 60-65, grade 1 diastolic dysfunction, MAC, mild LAE    Coronary artery disease involving native coronary artery of native heart without angina pectoris 11/11/2017   R/L HC 11/18: pLAD 45, oD1 65 ; elevated PCWP (mean 29), moderate pulmonary hypertension, CO 3.66, CI 2.07   Depression with anxiety 07/08/2017   Essential hypertension 07/08/2017   GERD with esophagitis 01/11/2018   Hernia of abdominal cavity    Hyperlipidemia    Hyperlipidemia LDL goal <130 05/25/2017   The 10-year ASCVD risk score Denman George DC Jr., et al., 2013) is: 31.1%   Values used to calculate the score:     Age: 55 years     Sex: Male     Is Non-Hispanic African American: No     Diabetic: No     Tobacco smoker: No     Systolic Blood Pressure: 140 mmHg     Is BP treated: No     HDL Cholesterol: 68.7 mg/dL     Total Cholesterol: 258 mg/dL    Idiopathic inflammatory myopathy 05/25/2017   Neuropathy involving both lower extremities 05/25/2017   Nonischemic cardiomyopathy (HCC) 11/11/2017   LHC 09/16/17-nonobstructive CAD   Oropharyngeal dysphagia 01/11/2018   Type 2 diabetes mellitus with complication, without long-term current use of insulin (HCC) 07/08/2017    Past Surgical History: Past Surgical History:  Procedure Laterality Date   BACK SURGERY     HERNIA REPAIR     RIGHT/LEFT HEART CATH AND CORONARY ANGIOGRAPHY N/A 09/16/2017   Procedure: RIGHT/LEFT HEART CATH AND CORONARY  ANGIOGRAPHY;  Surgeon: [place]az[place], Peter M, MD;  Location: [facility] IN[facility]ASIVE [facility];  Service: Cardiovascular;  Laterality: N/A;   SPINE SURGERY      Family History: Family History  Problem Relation Age of Onset   Stroke Mother    Kidney disease Father    Stroke Father    Hypertension Father    Early death Father    Cancer Neg Hx    Alcohol abuse Neg Hx    Diabetes Neg Hx    Heart disease Neg Hx    Hyperlipidemia Neg Hx     Social History: Social History   Socioeconomic History   Marital status: Single    Spouse name: Not on file   Number of children: Not on file   Years of education: Not on file  Highest education level: Not on file  Occupational History   Not on file  Tobacco Use   Smoking status: Never   Smokeless tobacco: Never  Vaping Use   Vaping Use: Never used  Substance and Sexual Activity   Alcohol use: No   Drug use: No   Sexual activity: Not Currently  Other Topics Concern   Not on file  Social History Narrative   [employer]   Social Determinants of Health   Financial Resource Strain: Not on file  Food Insecurity: Not on file  Transportation Needs: Not on file  Physical Activity: Not on file  Stress: Not on file  Social Connections: Not on file    Allergies:  Allergies  Allergen Reactions   Amoxicillin Hives   Bactrim [Sulfamethoxazole-Trimethoprim] Other (See Comments)    Dried out his eyes   Cephalexin Itching   Hydrocodone Itching   Lipitor [Atorvastatin] Other (See Comments)    Damage to calf muscles   Oxycodone Itching   Statins Other (See Comments)    Muscle pain    Vytorin [Ezetimibe-Simvastatin] Other (See Comments)    Damage to calf muscles    Objective:    Vital Signs:   Temp:  [96.6 F (35.9 C)-97.8 F (36.6 C)] 97.8 F (36.6 C) (05/20 1250) Pulse Rate:  [79-96] 84 (05/20 1250) Resp:  [18-24] 22 (05/20 1250) BP: (87-120)/(70-83) 105/72 (05/20 1250) SpO2:  [90 %-100 %] 96 % (05/20 1250) Weight:  [72.8 kg] 72.8 kg  (05/20 0402) Last BM Date :  (PTA)  Weight change: Filed Weights   03/07/23 2046 03/08/23 0346 03/09/23 0402  Weight: 71.5 kg 72.6 kg 72.8 kg    Intake/Output:   Intake/Output Summary (Last 24 hours) at 03/09/2023 1554 Last data filed at 03/09/2023 1200 Gross per 24 hour  Intake 240 ml  Output 2000 ml  Net -1760 ml      Physical Exam    General:  Elderly, chronically ill appearing HEENT: normal Neck: supple. JVP ~ 10 cm. Carotids 2+ bilat; no bruits.  Cor: PMI nondisplaced. Regular rate & rhythm. No rubs, gallops, 2/6 AS murmur Lungs: crackles bases Abdomen: soft, nontender, nondistended. Extremities: no cyanosis, clubbing, rash, trace edema, cold extremities Neuro: alert & orientedx3y. Affect pleasant   Telemetry   SR 90s, PACs  EKG    SR 99 bpm, PACs  Labs   Basic Metabolic Panel: Recent Labs  Lab 03/07/23 0952 03/08/23 0231 03/09/23 0334  NA 138 136 138  K 3.7 3.4* 3.6  CL 102 102 103  CO2 25 22 25   GLUCOSE 139* 90 118*  BUN 26* 19 20  CREATININE 1.21 1.09 1.18  CALCIUM 8.3* 8.2* 8.1*  MG 1.9  --   --     Liver Function Tests: Recent Labs  Lab 03/08/23 0231  AST 20  ALT 16  ALKPHOS 41  BILITOT 1.6*  PROT 5.2*  ALBUMIN 3.1*   No results for input(s): "LIPASE", "AMYLASE" in the last 168 hours. No results for input(s): "AMMONIA" in the last 168 hours.  CBC: Recent Labs  Lab 03/07/23 0952 03/08/23 0231 03/09/23 0334  WBC 9.1 6.6 6.0  NEUTROABS 6.1  --   --   HGB 12.8* 11.9* 11.6*  HCT 40.9 38.3* 37.5*  MCV 93.2 91.8 92.4  PLT 217 184 172    Cardiac Enzymes: No results for input(s): "CKTOTAL", "CKMB", "CKMBINDEX", "TROPONINI" in the last 168 hours.  BNP: BNP (last 3 results) Recent Labs    03/07/23 [phone]  BNP 2,668.0*    ProBNP (last 3 results) No results for input(s): "PROBNP" in the last 8760 hours.   CBG: Recent Labs  Lab 03/08/23 1150 03/08/23 1557 03/08/23 2130 03/09/23 0818 03/09/23 1252  GLUCAP 117* 152*  92 104* 106*    Coagulation Studies: No results for input(s): "LABPROT", "INR" in the last 72 hours.   Imaging   No results found.   Medications:     Current Medications:  apixaban  10 mg Oral BID   Followed by   Melene Muller ON 03/15/2023] apixaban  5 mg Oral BID   escitalopram  20 mg Oral Daily   ezetimibe  10 mg Oral Daily   furosemide  40 mg Intravenous Daily   insulin aspart  0-15 Units Subcutaneous TID WC   insulin aspart  0-5 Units Subcutaneous QHS   potassium chloride  20 mEq Oral Daily   rosuvastatin  40 mg Oral Daily    Infusions:     Patient Profile   82 y.o. male with history of NICM/chronic systolic CHF with recovered EF, nonobstructive CAD, hx CVA, HTN, HLD, carotid artery disease. Admitted with acute bilateral PEs and a/c CHF.  Assessment/Plan   Acute on chronic systolic CHF: - Known hx CHF with prior EF < 20% in 2018. LHC with nonobstructive CAD. - LVEF improved to 60-65% on echo 07/2018 with GDMT - Echo 05/21: EF 60-65% - Echo 03/07/23: EF < 20%, RV moderately reduced, mild to moderate MR, low-flow low-gradient  moderate AS with mean gradient 6 mmhg, AVA 1 cm2, DI 0.26, very low SV index, dilated IVC with estimated RAP 15 - Cold on exam. Worsening nausea and weight loss last few months with failure to thrive. Concern for low-output. Lactic acid pending. - Beta blocker stopped d/t concern for low-output - Place PICC. Follow CVP and CO-OX. Start DBA 2.5. Transfer to cardiac stepdown unit. - Worry about his prognosis with his baseline cognitive impairment and severity of LV dysfunction. Discussed options including trial with inotrope support to see if his condition improves vs more palliative approach. He wants to proceed with trial of inotropes. He is clear that he wants to remain DNR.  2. Bilateral PEs: - Suspect d/t low flow in setting of severe biventricular heart failure - Heparin off. Now on Eliquis.  3. Nonobstructive CAD/elevated troponin: -  Nonobstructive CAD on LHC in 2018 - Troponin 960>454>098 - Likely demand ischemia in setting of PE and CHF  4. Aortic valve stenosis: - Echo 03/07/23: EF < 20%, low-flow low-gradient  moderate AS with mean gradient 6 mmhg, AVA 1 cm2, DI 0.26, very low SV index, dilated IVC with estimated RAP 15  5. ?Hepatic cirrhosis/liver lesion: - Noted on CTA.  - Abdomen MRI w/ and w/o contrast recommended  6. Cognitive impairment: - Worsening since prior stroke in 2021    Length of Stay: 2  FINCH, LINDSAY N, PA-C  03/09/2023, 3:54 PM  Advanced Heart Failure Team Pager [phone] (M-F; 7a - 5p)  Please contact [facility] for night-coverage after hours (4p -7a ) and weekends on [email or web address]   Patient seen and examined with the above-signed Advanced Practice Provider and/or Housestaff. I personally reviewed laboratory data, imaging studies and relevant notes. I independently examined the patient and formulated the important aspects of the plan. I have edited the note to reflect any of my changes or salient points. I have personally discussed the plan with the patient and/or family.  82 y/o male with h/o systolic HF (due to NICM)  with recovered EF, short-term memory loss, AS.   Admitted with several weeks of progressive HF symptoms and FTT. W/u has shown severe, recurrent biventricular dysfunction with EF < 15% and significant RV dysfunction with at least moderate LF/LG aortic stenosis.. CT showed bilateral segmental/sub-segmental PE.   Functional capacity has been limited at home and ex-wife provides a significant amount of his care.   General:  Sitting up in bed . No resp difficulty HEENT: normal Neck: supple. JVP to jaw  Cor: PMI nondisplaced. Irregular rate & rhythm. Soft AS Lungs: decreased at bases Abdomen: soft, nontender, nondistended. No hepatosplenomegaly. No bruits or masses. Good bowel sounds. Extremities: no cyanosis, clubbing, rash, tr edema cool Neuro: alert & oriented, cranial  nerves grossly intact. moves all 4 extremities w/o difficulty. Affect pleasant  I suspect he has end-stage heart disease with severe biventricular dysfunction, at least moderate LF/LG AS and bilateral PE.  Given his memory loss issues and declining functional capacity at home I discussed palliative vs more aggressive approach. He currently would like to try more aggressive approach with the clear understanding than he would not want heroic measures or to be kept alive by machines. (DNR/DNI).   Will move to 2C. Start DBA and diuresis.   Doubt PE are primary issues here and just a result of RV stasis. Agree with heparin. No role for clot extraction.   Arvilla Meres, MD  6:48 PM

## 2023-03-09 NOTE — TOC Initial Note (Signed)
Transition of Care [facility]) - Initial/Assessment Note    Patient Details  Name: Christian [name] MRN: [id number] Date of Birth: 04/02/1941  [facility]) CM/SW Contact:    Ronny Bacon, RN Phone Number: 03/09/2023, 3:47 PM  Clinical Narrative:  Spoke with patient and ex-wife at bedside. Patient currently lives home alone without any caregiver support. Ex-wife lives next door but does not have the time to care for patient. Ex-wife requesting that patient goes to short term rehab. Patient does not have any DME at home and has two dogs. Patient has not been associated with any home health agency in the past. TOC will continue to follow for discharge needs.                Reached out to physical therapy to see if recommendations can be updated to SNF, awaiting response.        Patient Goals and CMS Choice Patient states their goals for this hospitalization and ongoing recovery are:: To go home          Expected Discharge Plan and Services       Living arrangements for the past 2 months: Single Family Home                                    Prior Living Arrangements/Services Living arrangements for the past 2 months: Single Family Home Lives with:: Self Patient language and need for interpreter reviewed:: Yes Do you feel safe going back to the place where you live?: Yes      Need for Family Participation in Patient Care: Yes (Comment) Care giver support system in place?: No (comment)   Criminal Activity/Legal Involvement Pertinent to Current Situation/Hospitalization: No - Comment as needed  Activities of Daily Living      Permission Sought/Granted Permission sought to share information with : Case Manager Permission granted to share information with : Yes, Verbal Permission Granted  Share Information with NAME: Winther,[name]   [phone]     Permission granted to share info w Relationship: Ex-Wife     Emotional Assessment Appearance:: Appears  stated age Attitude/Demeanor/Rapport: Engaged Affect (typically observed): Appropriate Orientation: : Oriented to Self, Oriented to Place, Oriented to  Time, Oriented to Situation Alcohol / Substance Use: Not Applicable Psych Involvement: No (comment)  Admission diagnosis:  Shortness of breath [R06.02] Elevated troponin [R79.89] Acute on chronic systolic CHF (congestive heart failure) (HCC) [I50.23] Atrial fibrillation, unspecified type (HCC) [I48.91] Pulmonary embolism, unspecified chronicity, unspecified pulmonary embolism type, unspecified whether acute cor pulmonale present (HCC) [I26.99] Patient Active Problem List   Diagnosis Date Noted   Acute on chronic systolic CHF (congestive heart failure) (HCC) 03/07/2023   Cerebrovascular disease 03/07/2023   Acute pulmonary embolism without acute cor pulmonale (HCC) 03/07/2023   Paroxysmal atrial fibrillation (HCC) 03/07/2023   Mood disorder (HCC) 03/07/2023   Nausea without vomiting 04/29/2022   GAD (generalized anxiety disorder) 04/29/2022   Rash and nonspecific skin eruption 04/29/2022   Allergic conjunctivitis of both eyes    Stage 2 chronic kidney disease    Benign essential HTN    Chronic systolic congestive heart failure (HCC)    Acute ischemic right posterior cerebral artery (PCA) stroke (HCC) 03/01/2020   Allergic rhinitis due to pollen 03/29/2018   Oropharyngeal dysphagia 01/11/2018   GERD with esophagitis 01/11/2018   Claudication of both lower extremities (HCC) 01/06/2018   Nonischemic cardiomyopathy (HCC) 11/11/2017  Coronary artery disease involving native coronary artery of native heart without angina pectoris 11/11/2017   Depression with anxiety 07/08/2017   Type 2 diabetes mellitus with complication, without long-term current use of insulin (HCC) 07/08/2017   Hyperlipidemia 05/25/2017   PCP:  [name], PA Pharmacy:   [facility] - [place], [place] - [address] [place] [address] Phone: [phone] Fax: [phone]  [facility] [address] [place] [address] Phone: [phone] Fax: [phone]     Social Determinants of Health (SDOH) Social History: SDOH Screenings   Alcohol Screen: Low Risk  (10/12/2019)  Depression (PHQ2-9): Medium Risk (04/29/2022)  Tobacco Use: Low Risk  (03/07/2023)   SDOH Interventions:     Readmission Risk Interventions     No data to display

## 2023-03-09 NOTE — Assessment & Plan Note (Addendum)
CTA on admission showed radiographic indications of cirrhosis and known right heart failure as well as 2.3 cm indeterminate lesion. Follow up as outpatient.

## 2023-03-09 NOTE — Progress Notes (Signed)
Progress Note   Patient: Christian [name] WGN:[id number] DOB: 10-18-1941 DOA: 03/07/2023     2 DOS: the patient was seen and examined on 03/09/2023 at 8:42 AM      Brief hospital course: Christian [name] is an 82 y.o. M with hx sCHF EF 20% (recovered to 60-65% on 2021 echo), NICM, stroke, HTN, and DM who presented with SOB over several months, now 2 days severe SOB --> bilateral PE and CHF.     5/18: Admitted on heparin 5/19: Lasix IV started for CHF, echo shows EF back down to <20% 5/20: Cardiology consulted     Assessment and Plan: * Acute pulmonary embolism without acute cor pulmonale (HCC) CTA chest on admission showed bilateral PE.   Echo shows moderate RVSD, most likely from his dilated cardiomyopathy, not the PE per se.  Hemodynamically stable, asymtpomatic.  No indication for lytics.  - Continue new Eliquis    Acute on chronic systolic CHF (congestive heart failure) (HCC) Presented with SOB, effusions and edema on CXR.  In 2019 admitted for CHF, had EF down to 25%, started on GDMT and EF recovered to 65%, and was there at most recent echo 2021.  I presume his DOE and lower BP the last few months is a reflection of the EF going down again.   *Note: dispense history for his Coreg (and several other home medicines) implies he has not had these filled in months  Net -1 L yesterday, still on oxygen.  I am apprehensive to titrate up his Lasix given soft blood pressure. - Continue IV Lasix  - Potassium -Daily weights, strict ins and outs - Daily BMP - Consult cardiology regarding reduced EF, appreciate recommendations - Continue Coreg - BP too low for spironolactone and Entresto     Paroxysmal atrial fibrillation (HCC) This is new onset.  TSH normal. Echo without valvular disease. CHA2DS2-Vasc 7 (age, CHF, HTN, DM, prior CVA, PVD).   Appears rate controlled. - Continue Coreg - Continue new Eliquis  Mood disorder (HCC) - Continue Lexapro  Cerebrovascular disease See  above  Benign essential HTN BP soft - Continue Coreg  Coronary artery disease involving native coronary artery of native heart without angina pectoris - Stop Plavix, aspirin (given Eliquis) - Continue Zetia   Type 2 diabetes mellitus with complication, without long-term current use of insulin (HCC) A1c 7.6% last Jan - SS corrections - Hold metformin  Hyperlipidemia - Continue Zetia          Subjective: Patient has no complaints, no chest pain, dyspnea, palpitations, respiratory distress.  No nursing concerns.     Physical Exam: BP 120/83 (BP Location: Left Arm)   Pulse 79   Temp 97.6 F (36.4 C) (Axillary)   Resp 18   Wt 72.8 kg   SpO2 96%   BMI 25.90 kg/m   Elderly adult male, lying in bed, interactive, hard of hearing, but attentive to conversation Irregularly irregular, rate controlled, no murmurs, 1+ lower extremity edema, pitting Respiratory rate normal, lung sounds diminished, no rales or wheezes Abdomen soft without tenderness palpation or guarding, no ascites or distention Hard of hearing, responses slowed, hearing very impaired, appears to have short-term memory loss, generalized weakness, symmetric strength, speech fluent, face symmetric    Data Reviewed: Basic metabolic panel shows stable renal function and potassium CBC with mild anemia only  Family Communication: Will call ex-wife [name] later today    Disposition: Status is: Inpatient Patient was admitted with bilateral PE, congestive heart failure and new onset  atrial fibrillation  He is stabilized from the standpoint of PE, but still requires IV diuresis.  Will consult cardiology today for reduced EF, hopefully we can stop IV diuresis tomorrow, cardiology recommendations will be finalized, and he can discharge to SNF tomorrow        Author: Alberteen Sam, MD 03/09/2023 8:59 AM  For on call review [email or web address].

## 2023-03-09 NOTE — Consult Note (Addendum)
Cardiology Consultation   Patient ID: Christian [name] MRN: [id number]; DOB: 1941-08-14  Admit date: 03/07/2023 Date of Consult: 03/09/2023  PCP:  Christian Motto, PA    [facility] Providers Cardiologist:  Christian Miss, MD  Cardiology APP:  Christian Lecher, PA-C  {   Patient Profile:   Christian [name] is a 83 y.o. male with a hx of non-ischemic cardiomyopathy, systolic heart failure with recovered LVEF, non-obstructive CAD, type 2 DM, right PCA CVA 2021, HTN, HLD, carotid artery disease, GERD,  who is being seen 03/09/2023 for the evaluation of CHF at the request of Christian [name].  History of Present Illness:   Christian [name] with above PMH presented to the ER 03/07/2023 with chief complaints of shortness of breath.  He reports 6-8 months of intermittent chest discomfort and shortness of breath with exertion.  He states he has been very tired and fatigued, felt low energy to do anything. He lives next door to his ex-wife, who helps taking care of his medical needs. Ex-wife reports he has been SOB for about 1 month and he is winded easily when he moves around in the house. Ex-wife states patient has memory loss and is very hard of hearing since his CVA 2021, did not have any complaints of chest pain at home. He was taken off Entresto 1.5 weeks ago by cardiology office, self stopped lasix for several months, and is complaint with coreg. He states he drinks a lot of water at regular base. He does not check his BP or weight at home. He has been urinating without issue. He denied any dizziness or syncope. He is not aware of any swelling of his legs. He is overall a poor historian.   Admission diagnostic from 03/07/2023 revealed creatinine 1.21 and GFR 60 and BUN 26.  BNP 2668.  High sensitive troponin [phone].  CBC diff with hemoglobin 12.8, otherwise unremarkable.  D-dimer elevated at 3.06.  TSH WNL.  CTA chest revealed bilateral pulmonary emboli in the central right upper lobe pulmonary artery  and segmental branches, and anterior segmental branch in the right lower lobe pulmonary artery, and posterior segmental branches of the left lower lobe pulmonary artery.  No evidence of right heart strain.  Small bilateral pleural effusion, mild dependent atelectasis, hepatic cirrhosis with  right heart insufficiency, 2.3 cm low-attenuation lesion of left hepatic lobe.  EKG revealed sinus arrhythmia, old LBBB. Echo from 03/07/23 showed LVEF <20%, global hypokinesis, mild LVH, indeterminate diastolic parameter, moderately reduced RV, mild LAE, mild to moderate MR, trivial AI, low flow low gradient moderate AS with mean gradient 6 mmHg, AVA 1cm2, DI 0.26, very low SV index 14cc/m2, dilatation of the ascending aorta 41mm.  He was started on heparin gtt for PE and IV Lasix for CHF. Cardiology is consulted today for further input today.   Per chart review, patient historically follows Christian. Elease [name], initially diagnosed with acute systolic heart failure in 08/2017.  Echo from 09/15/2017 showed LVEF 20%, diffuse hypokinesis, mildly dilated LV with mild LVH, mild AI, ascending aorta 4.2 cm, mild MR, mild TR , trivial PR.  Right and left heart cath 09/16/2017 revealed nonobstructive CAD with 45% proximal LAD, 65% first diagonal, moderate pulmonary hypertension, elevated PCWP 29 mmHg, CO 3.66L/min with index 2.07. He was placed on GDMT with coreg, entresto.  Limited Echo repeat on 08/04/18 revealed improved LVEF to 60 to 65%, grade 1 DD.  Echo from 02/26/2020 with LVEF 60 to 65%, normal RV, no significant valvular disease, moderate dilatation  of ascending aorta 42 mm.  He suffered right PCA stroke May 2021 that was felt due to small vessel disease, remains on Plavix currently.  He is historically not tolerant to statin due to muscle ache, but rechallenged with Crestor 40 mg daily.  He was last seen in the office 02/23/2023 by Christian Favre PA-C, concerned about his blood pressure, complaints of fatigue, increased shortness of breath  over the past few months.  Entresto was stopped due to low blood pressure.  Echocardiogram was planned and pending.      Past Medical History:  Diagnosis Date   Allergic rhinitis due to pollen 03/29/2018   Chronic systolic heart failure (HCC) - EF improved to normal on Rx    Echo 11/18: Mild LVH, EF 20, diffuse HK mild AI, a sending aorta 42 mm (mildly dilated), mild MR, mild TR, trivial PI, PASP 44 // Echo 3/19: mild LVH, EF 25-30, diff HK, Gr 1 DD, asc aorta 43 mm (mildly dilated), MAC, mild reduced RVSF // Limited echo 10/19: Mild concentric LVH, EF 60-65, grade 1 diastolic dysfunction, MAC, mild LAE    Coronary artery disease involving native coronary artery of native heart without angina pectoris 11/11/2017   R/L HC 11/18: pLAD 45, oD1 65 ; elevated PCWP (mean 29), moderate pulmonary hypertension, CO 3.66, CI 2.07   Depression with anxiety 07/08/2017   Essential hypertension 07/08/2017   GERD with esophagitis 01/11/2018   Hernia of abdominal cavity    Hyperlipidemia    Hyperlipidemia LDL goal <130 05/25/2017   The 10-year ASCVD risk score Denman George DC Jr., et al., 2013) is: 31.1%   Values used to calculate the score:     Age: 58 years     Sex: Male     Is Non-Hispanic African American: No     Diabetic: No     Tobacco smoker: No     Systolic Blood Pressure: 140 mmHg     Is BP treated: No     HDL Cholesterol: 68.7 mg/dL     Total Cholesterol: 258 mg/dL    Idiopathic inflammatory myopathy 05/25/2017   Neuropathy involving both lower extremities 05/25/2017   Nonischemic cardiomyopathy (HCC) 11/11/2017   LHC 09/16/17-nonobstructive CAD   Oropharyngeal dysphagia 01/11/2018   Type 2 diabetes mellitus with complication, without long-term current use of insulin (HCC) 07/08/2017    Past Surgical History:  Procedure Laterality Date   BACK SURGERY     HERNIA REPAIR     RIGHT/LEFT HEART CATH AND CORONARY ANGIOGRAPHY N/A 09/16/2017   Procedure: RIGHT/LEFT HEART CATH AND CORONARY ANGIOGRAPHY;  Surgeon: [name],  Christian M, MD;  Location: [facility];  Service: Cardiovascular;  Laterality: N/A;   SPINE SURGERY       Home Medications:  Prior to Admission medications   Medication Sig Start Date End Date Taking? Authorizing Provider  acetaminophen (TYLENOL) 325 MG tablet Take 2 tablets (650 mg total) by mouth every 4 (four) hours as needed for mild pain (or temp > 37.5 C (99.5 F)). 03/07/20   Angiulli, Mcarthur Rossetti, PA-C  albuterol (VENTOLIN HFA) 108 (90 Base) MCG/ACT inhaler Inhale 2 puffs into the lungs every 6 (six) hours as needed for wheezing or shortness of breath. 04/29/22   Janeece Agee, NP  carvedilol (COREG) 3.125 MG tablet TAKE 1 TABLET BY MOUTH DAILY 02/01/21   Nahser, Deloris Ping, MD  cholecalciferol (VITAMIN D) 1000 units tablet Take 1 tablet (1,000 Units total) by mouth daily. 03/07/20   Angiulli, Mcarthur Rossetti, PA-C  clopidogrel (PLAVIX) 75 MG tablet TAKE 1 TABLET BY MOUTH DAILY 12/08/22   Nahser, Deloris Ping, MD  [employer] GAS RELIEF EXTRA STRENGTH 125 MG chewable tablet Chew 125 mg by mouth as needed. 04/02/20   [provider]  escitalopram (LEXAPRO) 20 MG tablet Take 1 tablet (20 mg total) by mouth daily. 10/27/22   Christian Motto, PA  esomeprazole (NEXIUM) 40 MG capsule TAKE 1 CAPSULE BY MOUTH DAILY AS NEEDED FOR HEARTBURN 10/27/22   Christian Motto, PA  ezetimibe (ZETIA) 10 MG tablet Take 1 tablet (10 mg total) by mouth daily. 04/09/21   Nahser, Deloris Ping, MD  hydrOXYzine (ATARAX) 10 MG tablet Take 1 tablet (10 mg total) by mouth 2 (two) times daily as needed for itching or anxiety. 01/22/23   Christian Motto, PA  metFORMIN (GLUCOPHAGE) 500 MG tablet Take 1 tablet (500 mg total) by mouth daily with breakfast. 10/29/22   Christian Motto, PA  olopatadine (PATANOL) 0.1 % ophthalmic solution INSTILL 1 DROP INTO BOTH EYS DAILY AS NEEDED FOR ALLERGIES 03/20/20   Janeece Agee, NP  ondansetron (ZOFRAN-ODT) 4 MG disintegrating tablet TAKE 2 TABLETS BY MOUTH AS NEEDED 12/31/22   Christian Motto, PA   rosuvastatin (CRESTOR) 40 MG tablet Take 1 tablet (40 mg total) by mouth daily. 10/29/22   Christian Motto, PA  traZODone (DESYREL) 50 MG tablet Take 0.5-1 tablets (25-50 mg total) by mouth at bedtime as needed for sleep. 10/27/22   Christian Motto, PA  triamcinolone cream (KENALOG) 0.1 % 1 application Externally once a day, mix w/ itch relief lotion for 30 days    [provider]    Inpatient Medications: Scheduled Meds:  apixaban  10 mg Oral BID   Followed by   Melene Muller ON 03/15/2023] apixaban  5 mg Oral BID   escitalopram  20 mg Oral Daily   ezetimibe  10 mg Oral Daily   furosemide  40 mg Intravenous Daily   insulin aspart  0-15 Units Subcutaneous TID WC   insulin aspart  0-5 Units Subcutaneous QHS   potassium chloride  20 mEq Oral Daily   rosuvastatin  40 mg Oral Daily   Continuous Infusions:  PRN Meds: acetaminophen **OR** acetaminophen, calcium carbonate, ondansetron **OR** ondansetron (ZOFRAN) IV  Allergies:    Allergies  Allergen Reactions   Amoxicillin Hives   Bactrim [Sulfamethoxazole-Trimethoprim] Other (See Comments)    Dried out his eyes   Cephalexin Itching   Hydrocodone Itching   Lipitor [Atorvastatin] Other (See Comments)    Damage to calf muscles   Oxycodone Itching   Statins Other (See Comments)    Muscle pain    Vytorin [Ezetimibe-Simvastatin] Other (See Comments)    Damage to calf muscles    Social History:   Social History   Socioeconomic History   Marital status: Single    Spouse name: Not on file   Number of children: Not on file   Years of education: Not on file   Highest education level: Not on file  Occupational History   Not on file  Tobacco Use   Smoking status: Never   Smokeless tobacco: Never  Vaping Use   Vaping Use: Never used  Substance and Sexual Activity   Alcohol use: No   Drug use: No   Sexual activity: Not Currently  Other Topics Concern   Not on file  Social History Narrative   [employer]   Social  Determinants of Health   Financial Resource Strain: Not on file  Food Insecurity: Not on  file  Transportation Needs: Not on file  Physical Activity: Not on file  Stress: Not on file  Social Connections: Not on file  Intimate Partner Violence: Not on file    Family History:    Family History  Problem Relation Age of Onset   Stroke Mother    Kidney disease Father    Stroke Father    Hypertension Father    Early death Father    Cancer Neg Hx    Alcohol abuse Neg Hx    Diabetes Neg Hx    Heart disease Neg Hx    Hyperlipidemia Neg Hx      ROS:  Constitutional: Denied fever, chills, malaise, night sweats Eyes: Denied vision change or loss Ears/Nose/Mouth/Throat: Denied ear ache, sore throat, coughing, sinus pain Cardiovascular: see HPI  Respiratory: see HPI  Gastrointestinal: Denied nausea, vomiting, abdominal pain, diarrhea Genital/Urinary: Denied dysuria, hematuria, urinary frequency/urgency Musculoskeletal: Denied muscle ache, joint pain, weakness Skin: Denied rash, wound Neuro: Denied headache, dizziness, syncope Psych: Denied history of depression/anxiety  Endocrine: history of diabetes   Physical Exam/Data:   Vitals:   03/09/23 0500 03/09/23 0915 03/09/23 0920 03/09/23 1250  BP:    105/72  Pulse: 79 84 85 84  Resp: 18 (!) 21 (!) 24 (!) 22  Temp: 97.6 F (36.4 C)   97.8 F (36.6 C)  TempSrc: Axillary   Oral  SpO2: 96% 100% 95% 96%  Weight:        Intake/Output Summary (Last 24 hours) at 03/09/2023 1533 Last data filed at 03/09/2023 1200 Gross per 24 hour  Intake 240 ml  Output 2000 ml  Net -1760 ml      03/09/2023    4:02 AM 03/08/2023    3:46 AM 03/07/2023    8:46 PM  Last 3 Weights  Weight (lbs) 160 lb 7.9 oz 160 lb 0.9 oz 157 lb 10.1 oz  Weight (kg) 72.8 kg 72.6 kg 71.5 kg     Body mass index is 25.9 kg/m.  Vitals:  Vitals:   03/09/23 0920 03/09/23 1250  BP:  105/72  Pulse: 85 84  Resp: (!) 24 (!) 22  Temp:  97.8 F (36.6 C)  SpO2: 95%  96%   General Appearance: In no apparent distress, sitting in recliner  HEENT: Normocephalic, atraumatic. HOH Neck: Supple, trachea midline, JVD elevated to upper neck with HOB 60 degree  Cardiovascular: Regular rate and rhythm, normal S1-S2,  no murmur/rub/gallop, S3/S4 Respiratory: Resting breathing unlabored, lungs sounds with fine crackles bilaterally. On room air. Speaks full sentence  Gastrointestinal: Bowel sounds positive, abdomen soft, non-tender, non-distended. No mass or organomegaly.  Extremities: Able to move all extremities in bed without difficulty, BLE 1+ edema  Musculoskeletal: Normal muscle bulk and tone, global decondition and weakness  Skin: Intact, extremity cool to touch, dry. No rashes or petechiae noted in exposed areas.  Neurologic: Alert, oriented to person, place and time. + memory loss, no focal deficit  Psychiatric: Normal affect. Mood is appropriate.    EKG:  The EKG was personally reviewed and demonstrates:    EKG from 03/07/23 showed sinus arrhythmia 95bpm and 99bpm, PACs, old LBBB, some limitation for review by artifacts   Telemetry:  Telemetry was personally reviewed and demonstrates:    Sinus arrhythmia , PVCs and PACs, short runs of atrial tachycardia noted   Relevant CV Studies:   Echo from 03/07/2023:    1. Left ventricular ejection fraction, by estimation, is <20%. The left  ventricle has severely decreased function.  The left ventricle demonstrates  global hypokinesis. The left ventricular internal cavity size was mildly  dilated. There is mild left  ventricular hypertrophy. Left ventricular diastolic parameters are  indeterminate.   2. Right ventricular systolic function is moderately reduced. The right  ventricular size is normal. Tricuspid regurgitation signal is inadequate  for assessing PA pressure.   3. Left atrial size was mildly dilated.   4. The mitral valve is degenerative. Mild to moderate mitral valve  regurgitation. Moderate  mitral annular calcification.   5. The aortic valve is tricuspid. There is moderate calcification of the  aortic valve. Aortic valve regurgitation is trivial. MG across AV only  , but visually appears stenotic and AVA 1.0cm^2 and DI (0.26) suggest  moderate AS. Very low SV index (14   cc/m^2), suspect low flow low gradient moderate AS   6. Aortic dilatation noted. There is dilatation of the ascending aorta,  measuring 41 mm.   7. The inferior vena cava is dilated in size with <50% respiratory  variability, suggesting right atrial pressure of 15 mmHg.    Echo from 02/26/2020:  1. Left ventricular ejection fraction, by estimation, is 60 to 65%. The  left ventricle has normal function. The left ventricle has no regional  wall motion abnormalities. Left ventricular diastolic function could not  be evaluated.   2. Right ventricular systolic function is normal. The right ventricular  size is normal.   3. The mitral valve is grossly normal. No evidence of mitral valve  regurgitation. No evidence of mitral stenosis.   4. The aortic valve is tricuspid. Aortic valve regurgitation is not  visualized. Mild aortic valve sclerosis is present, with no evidence of  aortic valve stenosis.   5. Aortic dilatation noted. There is moderate dilatation of the ascending  aorta measuring 42 mm.    Right and left heart cath 09/16/2017:  Prox LAD lesion is 45% stenosed. 1st Diag lesion is 65% stenosed. Hemodynamic findings consistent with moderate pulmonary hypertension.   1. Nonobstructive CAD 2. Elevated LV filling pressures with PCWP mean of 29 mm Hg 3. Moderate pulmonary HTN 4. Cardiac output 3.66 L/min with index of 2.07.    Plan: optimize medical therapy for CHF.   Laboratory Data:  High Sensitivity Troponin:   Recent Labs  Lab 03/07/23 0952 03/07/23 1303 03/08/23 1308  TROPONINIHS 528* 584* 527*     Chemistry Recent Labs  Lab 03/07/23 0952 03/08/23 0231 03/09/23 0334  NA 138  136 138  K 3.7 3.4* 3.6  CL 102 102 103  CO2 25 22 25   GLUCOSE 139* 90 118*  BUN 26* 19 20  CREATININE 1.21 1.09 1.18  CALCIUM 8.3* 8.2* 8.1*  MG 1.9  --   --   GFRNONAA 60* >60 >60  ANIONGAP 11 12 10     Recent Labs  Lab 03/08/23 0231  PROT 5.2*  ALBUMIN 3.1*  AST 20  ALT 16  ALKPHOS 41  BILITOT 1.6*   Lipids No results for input(s): "CHOL", "TRIG", "HDL", "LABVLDL", "LDLCALC", "CHOLHDL" in the last 168 hours.  Hematology Recent Labs  Lab 03/07/23 0952 03/08/23 0231 03/09/23 0334  WBC 9.1 6.6 6.0  RBC 4.39 4.17* 4.06*  HGB 12.8* 11.9* 11.6*  HCT 40.9 38.3* 37.5*  MCV 93.2 91.8 92.4  MCH 29.2 28.5 28.6  MCHC 31.3 31.1 30.9  RDW 14.9 14.9 14.9  PLT 217 184 172   Thyroid  Recent Labs  Lab 03/07/23 0952  TSH 2.160  FREET4 0.98  BNP Recent Labs  Lab 03/07/23 0952  BNP 2,668.0*    DDimer  Recent Labs  Lab 03/07/23 0952  DDIMER 3.06*     Radiology/Studies:  ECHOCARDIOGRAM COMPLETE  Result Date: 03/07/2023    ECHOCARDIOGRAM REPORT   Patient Name:   BERT GETZ Date of Exam: 03/07/2023 Medical Rec #:  [id number]     Height:       66.0 in Accession #:    [id number]    Weight:       156.6 lb Date of Birth:  [birth year]     BSA:          1.802 m Patient Age:    81 years      BP:           102/87 mmHg Patient Gender: M             HR:           87 bpm. Exam Location:  Inpatient Procedure: 2D Echo, Cardiac Doppler and Color Doppler  Results communicated to Christian [name] at 18:36 on 03/07/23. Indications:    dilated cardiomyopathy  History:        Patient has prior history of Echocardiogram examinations, most                 recent 02/26/2020. CHF, CAD; Risk Factors:Diabetes and                 Dyslipidemia.  Sonographer:    Delcie Roch RDCS Referring Phys: [id number] Vinnie Level SMITH IMPRESSIONS  1. Left ventricular ejection fraction, by estimation, is <20%. The left ventricle has severely decreased function. The left ventricle demonstrates global hypokinesis. The left  ventricular internal cavity size was mildly dilated. There is mild left ventricular hypertrophy. Left ventricular diastolic parameters are indeterminate.  2. Right ventricular systolic function is moderately reduced. The right ventricular size is normal. Tricuspid regurgitation signal is inadequate for assessing PA pressure.  3. Left atrial size was mildly dilated.  4. The mitral valve is degenerative. Mild to moderate mitral valve regurgitation. Moderate mitral annular calcification.  5. The aortic valve is tricuspid. There is moderate calcification of the aortic valve. Aortic valve regurgitation is trivial. MG across AV only , but visually appears stenotic and AVA 1.0cm^2 and DI (0.26) suggest moderate AS. Very low SV index (14  cc/m^2), suspect low flow low gradient moderate AS  6. Aortic dilatation noted. There is dilatation of the ascending aorta, measuring 41 mm.  7. The inferior vena cava is dilated in size with <50% respiratory variability, suggesting right atrial pressure of 15 mmHg. FINDINGS  Left Ventricle: Left ventricular ejection fraction, by estimation, is <20%. The left ventricle has severely decreased function. The left ventricle demonstrates global hypokinesis. The left ventricular internal cavity size was mildly dilated. There is mild left ventricular hypertrophy. Left ventricular diastolic parameters are indeterminate. Right Ventricle: The right ventricular size is normal. Right vetricular wall thickness was not well visualized. Right ventricular systolic function is moderately reduced. Tricuspid regurgitation signal is inadequate for assessing PA pressure. Left Atrium: Left atrial size was mildly dilated. Right Atrium: Right atrial size was normal in size. Pericardium: There is no evidence of pericardial effusion. Mitral Valve: The mitral valve is degenerative in appearance. Moderate mitral annular calcification. Mild to moderate mitral valve regurgitation. Tricuspid Valve: The tricuspid  valve is normal in structure. Tricuspid valve regurgitation is trivial. Aortic Valve: The aortic valve is tricuspid. There is moderate calcification of the aortic valve. Aortic valve regurgitation  is trivial. Aortic valve mean gradient measures 4.5 mmHg. Aortic valve peak gradient measures 8.3 mmHg. Aortic valve area, by VTI  measures 1.19 cm. Pulmonic Valve: The pulmonic valve was not well visualized. Pulmonic valve regurgitation is trivial. Aorta: Aortic dilatation noted and the aortic root is normal in size and structure. There is dilatation of the ascending aorta, measuring 41 mm. Venous: The inferior vena cava is dilated in size with less than 50% respiratory variability, suggesting right atrial pressure of 15 mmHg. IAS/Shunts: The interatrial septum was not well visualized.  LEFT VENTRICLE PLAX 2D LVIDd:         5.80 cm LVIDs:         5.20 cm LV PW:         1.20 cm LV IVS:        1.20 cm LVOT diam:     2.30 cm LV SV:         30 LV SV Index:   16 LVOT Area:     4.15 cm  RIGHT VENTRICLE            IVC RV Basal diam:  2.90 cm    IVC diam: 2.20 cm RV S prime:     6.31 cm/s TAPSE (M-mode): 1.2 cm LEFT ATRIUM             Index        RIGHT ATRIUM           Index LA diam:        4.10 cm 2.27 cm/m   RA Area:     19.30 cm LA Vol (A2C):   63.9 ml 35.45 ml/m  RA Volume:   53.90 ml  29.91 ml/m LA Vol (A4C):   62.5 ml 34.68 ml/m LA Biplane Vol: 64.5 ml 35.79 ml/m  AORTIC VALVE AV Area (Vmax):    1.37 cm AV Area (Vmean):   1.26 cm AV Area (VTI):     1.19 cm AV Vmax:           144.00 cm/s AV Vmean:          96.100 cm/s AV VTI:            0.250 m AV Peak Grad:      8.3 mmHg AV Mean Grad:      4.5 mmHg LVOT Vmax:         47.57 cm/s LVOT Vmean:        29.133 cm/s LVOT VTI:          0.071 m LVOT/AV VTI ratio: 0.29  AORTA Ao Root diam: 3.60 cm Ao Asc diam:  4.10 cm MR Peak grad:    53.6 mmHg MR Mean grad:    34.0 mmHg    SHUNTS MR Vmax:         366.00 cm/s  Systemic VTI:  0.07 m MR Vmean:        276.0 cm/s   Systemic  Diam: 2.30 cm MR PISA:         1.01 cm MR PISA Eff ROA: 11 mm MR PISA Radius:  0.40 cm Epifanio Lesches MD Electronically signed by Epifanio Lesches MD Signature Date/Time: 03/07/2023/6:37:46 PM    Final    CT Angio Chest PE W and/or Wo Contrast  Result Date: 03/07/2023 CLINICAL DATA:  Shortness of breath.  Elevated D-dimer. EXAM: CT ANGIOGRAPHY CHEST WITH CONTRAST TECHNIQUE: Multidetector CT imaging of the chest was performed using the standard protocol during bolus administration of intravenous contrast. Multiplanar CT  image reconstructions and MIPs were obtained to evaluate the vascular anatomy. RADIATION DOSE REDUCTION: This exam was performed according to the departmental dose-optimization program which includes automated exposure control, adjustment of the mA and/or kV according to patient size and/or use of iterative reconstruction technique. CONTRAST:  75mL OMNIPAQUE IOHEXOL 350 MG/ML SOLN COMPARISON:  11/15/2016 FINDINGS: Cardiovascular: Pulmonary emboli are seen in the central right upper lobe pulmonary artery and segmental branches, an anterior segmental branch in the right lower lobe pulmonary artery, and posterior segmental branches of the left lower lobe pulmonary artery. No evidence of elevated RV/LV ratio. No evidence of thoracic aortic dissection or aneurysm. Contrast reflux into IVC and hepatic veins is consistent with right heart insufficiency. Mediastinum/Nodes: No masses or pathologically enlarged lymph nodes identified. Lungs/Pleura: Small pleural effusions are seen bilaterally with mild dependent atelectasis. No evidence of pulmonary consolidation or mass. Upper abdomen: Hepatic cirrhosis noted. 2.3 cm low-attenuation lesion is seen in the lateral segment of the left hepatic lobe, which cannot be characterized on this study. Musculoskeletal: No suspicious bone lesions identified. Review of the MIP images confirms the above findings. IMPRESSION: Bilateral pulmonary embolism, as  described above. No CT evidence of right heart strain. Small bilateral pleural effusions and mild dependent atelectasis. Hepatic cirrhosis. Findings consistent with right heart insufficiency. 2.3 cm low-attenuation lesion in left hepatic lobe, which cannot be characterized on this study. Recommend abdomen MRI without and with contrast for further characterization. Critical Value/emergent results were called by telephone at the time of interpretation on 03/07/2023 at 12:53 pm to provider Ernie Avena , who verbally acknowledged these results. Electronically Signed   By: Danae Orleans M.D.   On: 03/07/2023 12:57   DG Chest Portable 1 View  Result Date: 03/07/2023 CLINICAL DATA:  Shortness of breath EXAM: PORTABLE CHEST 1 VIEW COMPARISON:  02/24/2020 FINDINGS: Cardiomegaly, vascular congestion. Mild perihilar and infrahilar airspace opacities, likely edema. No effusions or acute bony abnormality. IMPRESSION: Mild CHF. Electronically Signed   By: Charlett Nose M.D.   On: 03/07/2023 10:13     Assessment and Plan:   Acute on chronic systolic and diastolic heart failure Non-ischemic cardiomyopathy - Initial diagnosis systolic heart failure 08/2017 with LVEF <20%, hear cath 2018 showed non-obstructive CAD and pulmonary hypertension, LVEF recovered to 60-65% on Echo 08/04/2018 with GDMT - now presents with month of DOE, fatigue, chest discomfort; found to have acute bilateral PE - BNP 2668, POA - CTA chest showed PE without right heart strain, also small pleural effusions, hepatic cirrhosis consistent with right heart insufficiency, POA - Echo repeated 5/18 showed LVEF <20%, global hypokinesis, mild LVH, indeterminate diastolic parameter, moderately reduced RV, mild LAE, mild to moderate MR, trivial AI, suspected low-flow low gradient moderate aortic stenosis with mean gradient 6 mmHg, AVA 1cm2, DI 0.26, very low SV index 14cc/cm2, and ascending aorta 41mm - would allow time for recovery from PE, will need R/L  heart cath in the near future  - s/p IV lasix 40mg  daily, off lasix PTA for months, Net -2L, weight is 157 >160 ibs since admission, clinically remains hypervolemic, cool extremity and borderline low BP, concern of low output CHF, will check lactic acid, if elevated will need COOX,  recommend continue IV Lasix 40mg  daily as long as BP allows, track weight and I&Os  - GDMT: on  PTA coreg 3.125mg  BID, no longer taking Entresto, BP has been low and unable to tolerate additional GDMT at this time, will stop coreg given concern of low output, may eventually  switched to Metoprolol XL 25mg , if BP has room later, plan to add low dose Entresto and Farxiga   Non-STEMI Non-obstructive CAD - Hs trop 500s flat - EKG showed no acute ischemic changes - Suspect demand ischemia from PE  - cath from 2018 showed non-obstructive CAD 45% proximal LAD, 65% first diagonal - Echo as above  - may consider repeat LHC once recover from PE  - continue medical therapy with BB, resume PTA crestor 40mg , and zetia 10mg ; no need for antiplatelet if on Eliquis from cardiology standpoint, noted plavix use for hx of CVA  Suspected low flow low gradient moderate aortic stenosis  - per Echo 5/18: suspected low-flow low gradient moderate aortic stenosis with mean gradient 6 mmHg, AVA 1cm2, DI 0.26, very low SV index 14cc/cm2, and ascending aorta 41mm - will need cardiac cath in the future for further evaluation once recover from PE  - Hold coreg given low BP, preload dependent   Sinus arrhythmia PACs - noted on EKG and telemetry -  optimize electrolytes keep K >4 and Mag >2   Acute bilateral PE without right heart strain, unprovoked  Ascending aorta dilatation  Hypertension Hyperlipidemia Hepatic lesion Type 2 DM  History of CVA - per primary team      Risk Assessment/Risk Scores:  { New York Heart Association (NYHA) Functional Class NYHA Class III    For questions or updates, please contact [facility] Please consult [email or web address] for contact info under   Signed, Cyndi Bender, NP  03/09/2023 3:33 PM  Personally seen and examined. Agree with APP above with the following comments:  Briefly 82 yo M with prior heart failure with reduced EF: he recovered and was doing well as of 2021.   He had a stroke in 2021, his mentation has been off since; his ex wife Dorita Fray his his healthcare POA. After several evals for SOB and fatigue (for which his medication has been decreased).  Due to persistence of symptoms when to ED.  His CT is consistent with EP.  In my evaluation his RV is dilated.  He had like moderate LFLAW with 2D planimetry greater than 1 cm2.  He had evidence of RV dysfunction and severe LV dysfunction.  On exam he is cold in all for extremities.  He is not consentable but he is conversant.  He feels great.  He notes that he feels better since starting the blood thinner. He re-iterates he is DNR and DNI but has no other barriers to therapy.  He is hypotensive. Notable systolic murmur with soft RV heave. Telemetry read AF: I have see largely sinus rhythm with PACs.  Would recommend  - getting stat lactate - reaching out to shock team; I think he will need inotropic support - will discuss for consideration for PERT intervention.  Riley Lam, MD FASE [facility] Cardiologist [facility]  [address] [place], [address] [place], [place] [address] [phone]  4:30 PM

## 2023-03-09 NOTE — Evaluation (Signed)
Physical Therapy Evaluation Patient Details Name: Christian [name] MRN: [id number] DOB: 1941-06-11 Today's Date: 03/09/2023  History of Present Illness  The pt is a 82 y.o. male presenting 5/18 with SOB x1 month. Work up revealed multiple bilateral segmental PE, started on heparin. PMH includes: R PCA CVA, Allergic rhinitis, HFrEF, CAD, HTN, GERD, Hernia of abdominal cavity, HLD, Neuropathy involving both lower extremities, and DM II.   Clinical Impression  Pt in bed upon arrival of PT, agreeable to evaluation at this time. Prior to admission the pt reports he was independent with all mobility, not using DME, and received intermittent assist from a friend for IADLs and transportation. The pt presented with some difficulties with cognition which is further impacted by [facility], but no family or friends were present to determine if this is baseline. With increased time and multimodal cues, he was able to follow instructions and complete multiple sit-stand transfers and ~75 ft total hallway ambulation with minG for safety.  SpO2 >93% on RA at rest, had single drop to 88% after ~40 ft on RA with pt feeling SOB, improved to 90s after standing rest and maintained for walk back to room. Will continue to benefit from maximal mobility during admission as well as continued skilled PT to progress endurance and establish HEP for improved strength, power, and endurance for return home.      Recommendations for follow up therapy are one component of a multi-disciplinary discharge planning process, led by the attending physician.  Recommendations may be updated based on patient status, additional functional criteria and insurance authorization.  Follow Up Recommendations       Assistance Recommended at Discharge Intermittent Supervision/Assistance  Patient can return home with the following  A little help with walking and/or transfers;A little help with bathing/dressing/bathroom;Assistance with cooking/housework;Direct  supervision/assist for medications management;Direct supervision/assist for financial management;Assist for transportation;Help with stairs or ramp for entrance    Equipment Recommendations None recommended by PT  Recommendations for Other Services       Functional Status Assessment Patient has had a recent decline in their functional status and demonstrates the ability to make significant improvements in function in a reasonable and predictable amount of time.     Precautions / Restrictions Precautions Precautions: Fall Precaution Comments: watch SpO2 Restrictions Weight Bearing Restrictions: No      Mobility  Bed Mobility Overal bed mobility: Needs Assistance Bed Mobility: Supine to Sit     Supine to sit: Supervision     General bed mobility comments: increased time, use of bed rails, no assist given    Transfers Overall transfer level: Needs assistance Equipment used: 1 person hand held assist, None Transfers: Sit to/from Stand, Bed to chair/wheelchair/BSC Sit to Stand: Min assist, Min guard   Step pivot transfers: Min assist       General transfer comment: progressed within session from minA to stand and pivot to minG for these transfers. pt steady upon initial stand    Ambulation/Gait Ambulation/Gait assistance: Min assist, Min guard Gait Distance (Feet): 15 Feet (+ 75) Assistive device: 1 person hand held assist, None Gait Pattern/deviations: Step-through pattern, Decreased stride length Gait velocity: decreased Gait velocity interpretation: <1.31 ft/sec, indicative of household ambulator   General Gait Details: pt with slight trunk flexion and slowed pace, drop to 88% after 40 ft on RA with pt SOB, improved to 90s after standing rest and maintained for walk back to room.     Balance Overall balance assessment: Needs assistance Sitting-balance support: No upper extremity  supported Sitting balance-Leahy Scale: Good     Standing balance support: No  upper extremity supported, During functional activity Standing balance-Leahy Scale: Fair                               Pertinent Vitals/Pain Pain Assessment Pain Assessment: No/denies pain    Home Living Family/patient expects to be discharged to:: Private residence Living Arrangements: Alone Available Help at Discharge: Family;Available PRN/intermittently (ex-wife) Type of Home: House Home Access: Stairs to enter   [employer] of Steps: 1   Home Layout: One level Home Equipment: Shower seat Additional Comments: no family present to confirm, pt giving some inconsistent answers    Prior Function Prior Level of Function : Needs assist             Mobility Comments: no AD use ADLs Comments: ind, ex-wife provides transportation, does IADLs, showers seated     Hand Dominance   Dominant Hand: Right    Extremity/Trunk Assessment   Upper Extremity Assessment Upper Extremity Assessment: Defer to OT evaluation    Lower Extremity Assessment Lower Extremity Assessment: Overall WFL for tasks assessed    Cervical / Trunk Assessment Cervical / Trunk Assessment: Normal  Communication   Communication: HOH  Cognition Arousal/Alertness: Awake/alert Behavior During Therapy: WFL for tasks assessed/performed Overall Cognitive Status: Impaired/Different from baseline Area of Impairment: Orientation, Memory, Following commands, Safety/judgement, Awareness, Problem solving                 Orientation Level: Disoriented to, Time   Memory: Decreased recall of precautions, Decreased short-term memory Following Commands: Follows one step commands inconsistently, Follows one step commands with increased time (likely due to [facility]) Safety/Judgement: Decreased awareness of safety Awareness: Intellectual Problem Solving: Slow processing, Difficulty sequencing General Comments: no family present to confirm baseline, pt limited by [facility] at times, but also unaware  of date and unable to retain information regarding frequent urination despite being told multiple times through session        General Comments General comments (skin integrity, edema, etc.): SpO2 stable on RA, drop to 88% after 40 ft ambulation, recovered to 90s on RA with standing rest adn PLB, then maintained in 90s for return to room, pt reports fatigue but SpO2 > 90%    Exercises     Assessment/Plan    PT Assessment Patient needs continued PT services  PT Problem List Cardiopulmonary status limiting activity;Decreased activity tolerance;Decreased balance;Decreased mobility;Decreased cognition       PT Treatment Interventions Gait training;Stair training;Functional mobility training;Therapeutic exercise;Therapeutic activities;Balance training;Patient/family education    PT Goals (Current goals can be found in the Care Plan section)  Acute Rehab PT Goals Patient Stated Goal: improve endurance PT Goal Formulation: With patient Time For Goal Achievement: 03/23/23 Potential to Achieve Goals: Good    Frequency Min 1X/week        AM-PAC PT "6 Clicks" Mobility  Outcome Measure Help needed turning from your back to your side while in a flat bed without using bedrails?: None Help needed moving from lying on your back to sitting on the side of a flat bed without using bedrails?: A Little Help needed moving to and from a bed to a chair (including a wheelchair)?: A Little Help needed standing up from a chair using your arms (e.g., wheelchair or bedside chair)?: A Little Help needed to walk in hospital room?: A Little Help needed climbing 3-5 steps with a railing? :  A Little 6 Click Score: 19    End of Session Equipment Utilized During Treatment: Gait belt Activity Tolerance: Patient tolerated treatment well;Patient limited by fatigue Patient left: in chair;with call bell/phone within reach;with chair alarm set Nurse Communication: Mobility status PT Visit Diagnosis: Other  abnormalities of gait and mobility (R26.89)    Time: 1610-9604 PT Time Calculation (min) (ACUTE ONLY): 29 min   Charges:   PT Evaluation $PT Eval Low Complexity: 1 Low          Vickki Muff, PT, DPT   Acute Rehabilitation Department Office [phone] Secure Chat Communication Preferred  Ronnie Derby 03/09/2023, 10:39 AM

## 2023-03-09 NOTE — Progress Notes (Addendum)
Peripherally Inserted Central Catheter Placement  The IV Nurse has discussed with the patient and/or persons authorized to consent for the patient, the purpose of this procedure and the potential benefits and risks involved with this procedure.  The benefits include less needle sticks, lab draws from the catheter, and the patient may be discharged home with the catheter. Risks include, but not limited to, infection, bleeding, blood clot (thrombus formation), and puncture of an artery; nerve damage and irregular heartbeat and possibility to perform a PICC exchange if needed/ordered by physician.  Alternatives to this procedure were also discussed.  Bard Power PICC patient education guide, fact sheet on infection prevention and patient information card has been provided to patient /or left at bedside.  Patient was alert and oriented x4.  PICC Placement Documentation  PICC Double Lumen 03/09/23 Right Brachial 35 cm 0 cm (Active)  Indication for Insertion or Continuance of Line Vasoactive infusions;Chronic illness with exacerbations (CF, Sickle Cell, etc.) 03/09/23 2228  Exposed Catheter (cm) 0 cm 03/09/23 2228  Site Assessment Clean, Dry, Intact 03/09/23 2228  Lumen #1 Status Flushed;Saline locked;Blood return noted 03/09/23 2228  Lumen #2 Status Flushed;Saline locked;Blood return noted 03/09/23 2228  Dressing Type Transparent;Securing device 03/09/23 2228  Dressing Status Antimicrobial disc in place;Clean, Dry, Intact 03/09/23 2228  Safety Lock Not Applicable 03/09/23 2228  Line Care Connections checked and tightened 03/09/23 2228  Line Adjustment (NICU/IV Team Only) No 03/09/23 2228  Dressing Intervention New dressing 03/09/23 2228  Dressing Change Due 03/16/23 03/09/23 2228       Alexes Lamarque, Lajean Manes 03/09/2023, 10:28 PM

## 2023-03-09 NOTE — Telephone Encounter (Signed)
Pharmacy Patient Advocate Encounter  Insurance verification completed.    The patient is insured through [employer]   The patient is currently admitted and ran test claims for the following: Eliquis.  Copays and coinsurance results were relayed to Inpatient clinical team.   

## 2023-03-09 NOTE — Progress Notes (Addendum)
   Heart Failure Stewardship Pharmacist Progress Note   PCP: [name], PA PCP-Cardiologist: Kristeen Miss, MD    HPI:  82 yo M with PMH of CVA, CHF, NICM, HTN, and T2DM.  First found to have CHF in 2018, EF 20%, nonobstructive CAD on cath. Started on [place] and EF normalized in 2021. Entresto stopped due to symptomatic hypotension on 02/23/23.  Presented to the ED on 5/18 with shortness of breath x 1 month, orthopnea, and chest discomfort. CXR with mild CHF. CTA with bilateral PE, no evidence of right heart strain. ECHO 5/18 showed EF reduced back to <20%, global hypokinesis, mild LVH, RV moderately reduced, mild to moderate MR, and moderate AS.  Of note, patient still believes he was taking Entresto twice daily before this admission. He does not recall the recent discontinuation but states [name] normally handles his medications. She was not at bedside today.   Current HF Medications: Diuretic: furosemide 40 mg IV daily Beta Blocker: carvedilol 3.125 mg daily  Prior to admission HF Medications: Beta blocker: carvedilol 3.125 mg daily  Pertinent Lab Values: Serum creatinine 1.18, BUN 20, Potassium 3.6, Sodium 138, BNP 2668, Magnesium 1.9, A1c 7.6   Vital Signs: Weight: 160 lbs (admission weight: 157 lbs) Blood pressure: 110/80s  Heart rate: 80-90s  I/O: -0.7L yesterday; net -1L  Medication Assistance / Insurance Benefits Check: Does the patient have prescription insurance?  Yes Type of insurance plan: UHC Medicare  Does the patient qualify for medication assistance through manufacturers or grants?   Yes Eligible grants and/or patient assistance programs: pending Medication assistance applications in progress: none  Medication assistance applications approved: none Approved medication assistance renewals will be completed by: pending  Outpatient Pharmacy:  Prior to admission outpatient pharmacy: [facility] Is the patient willing to use [facility] TOC pharmacy  at discharge? Yes Is the patient willing to transition their outpatient pharmacy to utilize a [facility] outpatient pharmacy?   No    Assessment: 1. Acute on chronic systolic CHF (LVEF <20%). NYHA class III symptoms. - SCr increased 1.09>>1.18 from yesterday on furosemide 40 mg IV daily. Has had sub-optimal urine output, weight stable from yesterday. May need to increase lasix dose. - On carvedilol 3.125 mg daily, consider increasing to BID prior to discharge - Entresto recently stopped with symptomatic hypotension, may be able to start ARB and spironolactone prior to discharge pending BP - Consider adding SGLT2i prior to discharge    Plan: 1) Medication changes recommended at this time: - Increase to furosemide 40 mg IV BID  2) Patient assistance: Sherryll Burger copay $47 - Farxiga/Jardiance copay $47  3)  Education  - Initial education completed  - Full education to be completed prior to discharge  Sharen Hones, PharmD, BCPS Heart Failure Engineer, building services Phone [phone]

## 2023-03-09 NOTE — TOC Benefit Eligibility Note (Signed)
Patient Product/process development scientist completed.    The patient is currently admitted and upon discharge could be taking Eliquis.  The current 30 day co-pay is $47.00.   The patient is insured through [employer]   This test claim was processed through [facility]- copay amounts may vary at other pharmacies due to pharmacy/plan contracts, or as the patient moves through the different stages of their insurance plan.

## 2023-03-09 NOTE — Evaluation (Signed)
Occupational Therapy Evaluation Patient Details Name: Christian [name] MRN: [id number] DOB: 03-Feb-1941 Today's Date: 03/09/2023   History of Present Illness The pt is a 82 y.o. male presenting 5/18 with SOB x1 month. Work up revealed multiple bilateral segmental PE, started on heparin. PMH includes: R PCA CVA, Allergic rhinitis, HFrEF, CAD, HTN, GERD, Hernia of abdominal cavity, HLD, Neuropathy involving both lower extremities, and DM II.   Clinical Impression   Pt reports independence at baseline with ADLs and functional mobility, lives alone but reports ex-wife assists frequently with IADLs (transportation, med mgmt). Pt currently with impaired cognition (vs HOH?), pt needing set up -mod A for ADLs, supervision for bed mobility,and min A for transfers without AD. Pt presenting with impairments listed below, will follow acutely. Recommend HHOT at d/c.       Recommendations for follow up therapy are one component of a multi-disciplinary discharge planning process, led by the attending physician.  Recommendations may be updated based on patient status, additional functional criteria and insurance authorization.   Assistance Recommended at Discharge Intermittent Supervision/Assistance  Patient can return home with the following A little help with walking and/or transfers;A little help with bathing/dressing/bathroom;Assistance with cooking/housework;Direct supervision/assist for medications management;Direct supervision/assist for financial management;Assist for transportation;Help with stairs or ramp for entrance    Functional Status Assessment  Patient has had a recent decline in their functional status and demonstrates the ability to make significant improvements in function in a reasonable and predictable amount of time.  Equipment Recommendations  None recommended by OT    Recommendations for Other Services PT consult     Precautions / Restrictions Precautions Precautions: Fall Precaution  Comments: watch SpO2 Restrictions Weight Bearing Restrictions: No      Mobility Bed Mobility Overal bed mobility: Needs Assistance Bed Mobility: Supine to Sit     Supine to sit: Supervision     General bed mobility comments: increased time, use of bed rails, no assist given    Transfers Overall transfer level: Needs assistance Equipment used: 1 person hand held assist, None Transfers: Sit to/from Stand, Bed to chair/wheelchair/BSC Sit to Stand: Min assist, Min guard     Step pivot transfers: Min assist     General transfer comment: progressed within session from minA to stand and pivot to minG for these transfers. pt steady upon initial stand      Balance Overall balance assessment: Needs assistance Sitting-balance support: No upper extremity supported Sitting balance-Leahy Scale: Good     Standing balance support: No upper extremity supported, During functional activity Standing balance-Leahy Scale: Fair                             ADL either performed or assessed with clinical judgement   ADL Overall ADL's : Needs assistance/impaired Eating/Feeding: Set up;Sitting   Grooming: Set up;Standing   Upper Body Bathing: Minimal assistance   Lower Body Bathing: Moderate assistance   Upper Body Dressing : Minimal assistance   Lower Body Dressing: Moderate assistance   Toilet Transfer: Min guard;Ambulation;Regular Social worker and Hygiene: Min guard       Functional mobility during ADLs: Min guard       Vision Baseline Vision/History: 1 Wears glasses Vision Assessment?: No apparent visual deficits     Perception Perception Perception Tested?: No   Praxis Praxis Praxis tested?: Not tested    Pertinent Vitals/Pain Pain Assessment Pain Assessment: No/denies pain     Hand Dominance  Right   Extremity/Trunk Assessment Upper Extremity Assessment Upper Extremity Assessment: Generalized weakness   Lower  Extremity Assessment Lower Extremity Assessment: Defer to PT evaluation   Cervical / Trunk Assessment Cervical / Trunk Assessment: Normal   Communication Communication Communication: HOH   Cognition Arousal/Alertness: Awake/alert Behavior During Therapy: WFL for tasks assessed/performed Overall Cognitive Status: Impaired/Different from baseline Area of Impairment: Orientation, Memory, Following commands, Safety/judgement, Awareness, Problem solving                 Orientation Level: Disoriented to, Time   Memory: Decreased recall of precautions, Decreased short-term memory Following Commands: Follows one step commands with increased time, Follows one step commands inconsistently Safety/Judgement: Decreased awareness of safety Awareness: Intellectual Problem Solving: Slow processing, Difficulty sequencing General Comments: HOH vs cognition?     General Comments  SpO2 down to 88% on RA, quickly increased to 90's with cues for breathing and sitting up in chair    Exercises     Shoulder Instructions      Home Living Family/patient expects to be discharged to:: Private residence Living Arrangements: Alone Available Help at Discharge: Family;Available PRN/intermittently (ex-wife) Type of Home: House Home Access: Stairs to enter [employer] of Steps: 1   Home Layout: One level     Bathroom Shower/Tub: Producer, television/film/video: Standard Bathroom Accessibility: Yes How Accessible: Accessible via walker Home Equipment: Shower seat   Additional Comments: no family present to confirm, pt giving some inconsistent answers      Prior Functioning/Environment Prior Level of Function : Needs assist             Mobility Comments: no AD use ADLs Comments: ind, ex-wife provides transportation, does IADLs (med mgmt), showers seated        OT Problem List: Decreased strength;Decreased range of motion;Decreased activity tolerance;Impaired balance  (sitting and/or standing);Decreased coordination;Decreased cognition;Decreased safety awareness      OT Treatment/Interventions: Self-care/ADL training;Therapeutic exercise;Energy conservation;DME and/or AE instruction;Therapeutic activities;Patient/family education;Balance training    OT Goals(Current goals can be found in the care plan section) Acute Rehab OT Goals Patient Stated Goal: none stated OT Goal Formulation: With patient Time For Goal Achievement: 03/23/23 Potential to Achieve Goals: Good ADL Goals Pt Will Perform Upper Body Dressing: with modified independence;sitting Pt Will Perform Lower Body Dressing: with modified independence;sitting/lateral leans;sit to/from stand Pt Will Transfer to Toilet: with modified independence;ambulating;regular height toilet Additional ADL Goal #1: pt will follow 3 step command in prep for ADLs.  OT Frequency: Min 1X/week    Co-evaluation              AM-PAC OT "6 Clicks" Daily Activity     Outcome Measure Help from another person eating meals?: None Help from another person taking care of personal grooming?: A Little Help from another person toileting, which includes using toliet, bedpan, or urinal?: A Little Help from another person bathing (including washing, rinsing, drying)?: A Lot Help from another person to put on and taking off regular upper body clothing?: A Little Help from another person to put on and taking off regular lower body clothing?: A Lot 6 Click Score: 17   End of Session Equipment Utilized During Treatment: Gait belt Nurse Communication: Mobility status  Activity Tolerance: Patient tolerated treatment well Patient left: in chair;with call bell/phone within reach;with chair alarm set  OT Visit Diagnosis: Unsteadiness on feet (R26.81);Other abnormalities of gait and mobility (R26.89);Muscle weakness (generalized) (M62.81)  Time: 1610-9604 OT Time Calculation (min): 29 min Charges:  OT General  Charges $OT Visit: 1 Visit OT Evaluation $OT Eval Moderate Complexity: 1 Mod  Irem Stoneham K, OTD, OTR/L [facility] [phone]   Carver Fila Koonce 03/09/2023, 12:58 PM

## 2023-03-10 ENCOUNTER — Other Ambulatory Visit: Payer: Self-pay

## 2023-03-10 ENCOUNTER — Other Ambulatory Visit (HOSPITAL_COMMUNITY): Payer: Self-pay

## 2023-03-10 ENCOUNTER — Institutional Professional Consult (permissible substitution): Payer: Medicare Other | Admitting: Pulmonary Disease

## 2023-03-10 DIAGNOSIS — I5043 Acute on chronic combined systolic (congestive) and diastolic (congestive) heart failure: Secondary | ICD-10-CM | POA: Diagnosis not present

## 2023-03-10 DIAGNOSIS — I2699 Other pulmonary embolism without acute cor pulmonale: Secondary | ICD-10-CM | POA: Diagnosis not present

## 2023-03-10 DIAGNOSIS — I10 Essential (primary) hypertension: Secondary | ICD-10-CM | POA: Diagnosis not present

## 2023-03-10 DIAGNOSIS — I5023 Acute on chronic systolic (congestive) heart failure: Secondary | ICD-10-CM | POA: Diagnosis not present

## 2023-03-10 DIAGNOSIS — R338 Other retention of urine: Secondary | ICD-10-CM

## 2023-03-10 DIAGNOSIS — I480 Paroxysmal atrial fibrillation: Secondary | ICD-10-CM | POA: Diagnosis not present

## 2023-03-10 LAB — CBC
HCT: 40.9 % (ref 39.0–52.0)
Hemoglobin: 12.8 g/dL — ABNORMAL LOW (ref 13.0–17.0)
MCH: 28.3 pg (ref 26.0–34.0)
MCHC: 31.3 g/dL (ref 30.0–36.0)
MCV: 90.5 fL (ref 80.0–100.0)
Platelets: 176 10*3/uL (ref 150–400)
RBC: 4.52 MIL/uL (ref 4.22–5.81)
RDW: 15 % (ref 11.5–15.5)
WBC: 6.2 10*3/uL (ref 4.0–10.5)
nRBC: 0 % (ref 0.0–0.2)

## 2023-03-10 LAB — BASIC METABOLIC PANEL
Anion gap: 13 (ref 5–15)
BUN: 15 mg/dL (ref 8–23)
CO2: 29 mmol/L (ref 22–32)
Calcium: 8.6 mg/dL — ABNORMAL LOW (ref 8.9–10.3)
Chloride: 98 mmol/L (ref 98–111)
Creatinine, Ser: 1.13 mg/dL (ref 0.61–1.24)
GFR, Estimated: >60 mL/min (ref >60)
Glucose, Bld: 122 mg/dL — ABNORMAL HIGH (ref 70–99)
Potassium: 3.5 mmol/L (ref 3.5–5.1)
Sodium: 140 mmol/L (ref 135–145)

## 2023-03-10 LAB — GLUCOSE, CAPILLARY
Glucose-Capillary: 120 mg/dL — ABNORMAL HIGH (ref 70–99)
Glucose-Capillary: 163 mg/dL — ABNORMAL HIGH (ref 70–99)
Glucose-Capillary: 179 mg/dL — ABNORMAL HIGH (ref 70–99)
Glucose-Capillary: 158 mg/dL — ABNORMAL HIGH (ref 70–99)

## 2023-03-10 LAB — COOXEMETRY PANEL
Carboxyhemoglobin: 1.6 % — ABNORMAL HIGH (ref 0.5–1.5)
Methemoglobin: <0.7 % (ref 0.0–1.5)
O2 Saturation: 64.1 %
Total hemoglobin: 13.2 g/dL (ref 12.0–16.0)

## 2023-03-10 MED ORDER — AMIODARONE LOAD VIA INFUSION
150.0000 mg | Freq: Once | INTRAVENOUS | Status: AC
  Administered 2023-03-10: 150 mg via INTRAVENOUS
  Filled 2023-03-10: qty 83.34

## 2023-03-10 MED ORDER — AMIODARONE HCL IN DEXTROSE 360-4.14 MG/200ML-% IV SOLN
INTRAVENOUS | Status: AC
  Filled 2023-03-10: qty 200

## 2023-03-10 MED ORDER — POTASSIUM CHLORIDE CRYS ER 20 MEQ PO TBCR
40.0000 meq | EXTENDED_RELEASE_TABLET | Freq: Once | ORAL | Status: AC
  Administered 2023-03-10: 40 meq via ORAL
  Filled 2023-03-10: qty 2

## 2023-03-10 MED ORDER — AMIODARONE HCL IN DEXTROSE 360-4.14 MG/200ML-% IV SOLN
60.0000 mg/h | INTRAVENOUS | Status: AC
  Administered 2023-03-10 (×2): 60 mg/h via INTRAVENOUS
  Filled 2023-03-10: qty 200

## 2023-03-10 MED ORDER — METOPROLOL TARTRATE 5 MG/5ML IV SOLN: 5.0000 mg | INTRAVENOUS | Status: DC | PRN

## 2023-03-10 MED ORDER — AMIODARONE HCL IN DEXTROSE 360-4.14 MG/200ML-% IV SOLN
30.0000 mg/h | INTRAVENOUS | Status: DC
  Administered 2023-03-10 – 2023-03-11 (×2): 30 mg/h via INTRAVENOUS
  Filled 2023-03-10: qty 200

## 2023-03-10 NOTE — Progress Notes (Incomplete)
TRH night cross cover note:   I was notified by RN that this patient was hospitalized with acute on chronic systolic heart failure, who was just started on dobutamine a few hours ago, as heart rates in the range of the low 100s to 150s; BP   of postvoid residual scan of greater than 800 cc.  I placed order for straight cath x 1 now.    Newton Pigg, DO Hospitalist

## 2023-03-10 NOTE — TOC Initial Note (Addendum)
Transition of Care [facility]) - Initial/Assessment Note    Patient Details  Name: Christian [name] MRN: [id number] Date of Birth: 06/24/1941  [facility]) CM/SW Contact:    Elliot Cousin, RN Phone Number: [phone] 03/10/2023, 8:44 AM  Clinical Narrative:  HF TOC CM spoke to pt and ex-wife, [place]. Pt lives at home alone. Active with [facility] for [facility]. Wife states she takes pt to his appts. Notified Trey Paula, Cory.       Pt may need oxygen for home. Pt does not have a scale. Wife will pick up scale.              Expected Discharge Plan: Home w Home Health Services Barriers to Discharge: Continued Medical Work up   Patient Goals and CMS Choice Patient states their goals for this hospitalization and ongoing recovery are:: To go home CMS Medicare.gov Compare Post Acute Care list provided to:: Patient Choice offered to / list presented to :  (Ex-Wife)      Expected Discharge Plan and Services   Discharge Planning Services: CM Consult   Living arrangements for the past 2 months: Single Family Home                           HH Arranged: RN [facility] Agency: [facility] Date [facility] Agency Contacted: 03/10/23 Time HH Agency Contacted: [phone] Representative spoke with at [facility] Agency: Lorenza Chick  Prior Living Arrangements/Services Living arrangements for the past 2 months: Single Family Home Lives with:: Self Patient language and need for interpreter reviewed:: Yes Do you feel safe going back to the place where you live?: Yes      Need for Family Participation in Patient Care: Yes (Comment) Care giver support system in place?: No (comment)   Criminal Activity/Legal Involvement Pertinent to Current Situation/Hospitalization: No - Comment as needed  Activities of Daily Living      Permission Sought/Granted Permission sought to share information with : Case Manager, Family Supports, PCP Permission granted to share information with : Yes, Verbal Permission  Granted  Share Information with NAME: Gudgel,[name]   [phone]     Permission granted to share info w Relationship: Ex-Wife     Emotional Assessment Appearance:: Appears stated age Attitude/Demeanor/Rapport: Engaged Affect (typically observed): Accepting Orientation: : Oriented to Self, Oriented to Place, Oriented to  Time, Oriented to Situation Alcohol / Substance Use: Not Applicable Psych Involvement: No (comment)  Admission diagnosis:  Shortness of breath [R06.02] Elevated troponin [R79.89] Acute on chronic systolic CHF (congestive heart failure) (HCC) [I50.23] Atrial fibrillation, unspecified type (HCC) [I48.91] Pulmonary embolism, unspecified chronicity, unspecified pulmonary embolism type, unspecified whether acute cor pulmonale present (HCC) [I26.99] Patient Active Problem List   Diagnosis Date Noted   Liver lesion 03/09/2023   Acute systolic heart failure (HCC) 03/07/2023   Cerebrovascular disease 03/07/2023   Acute pulmonary embolism without acute cor pulmonale (HCC) 03/07/2023   Paroxysmal atrial fibrillation (HCC) 03/07/2023   Mood disorder (HCC) 03/07/2023   Nausea without vomiting 04/29/2022   GAD (generalized anxiety disorder) 04/29/2022   Rash and nonspecific skin eruption 04/29/2022   Allergic conjunctivitis of both eyes    Stage 2 chronic kidney disease    Benign essential HTN    Chronic systolic congestive heart failure (HCC)    Acute ischemic right posterior cerebral artery (PCA) stroke (HCC) 03/01/2020   Allergic rhinitis due to pollen 03/29/2018   Oropharyngeal dysphagia 01/11/2018   GERD with  esophagitis 01/11/2018   Claudication of both lower extremities (HCC) 01/06/2018   Nonischemic cardiomyopathy (HCC) 11/11/2017   Coronary artery disease involving native coronary artery of native heart without angina pectoris 11/11/2017   Depression with anxiety 07/08/2017   Type 2 diabetes mellitus with complication, without long-term current use of insulin  (HCC) 07/08/2017   Hyperlipidemia 05/25/2017   PCP:  [name], PA Pharmacy:   [facility] - [place], [place] - [address] [place] [address] Phone: [phone] Fax: [phone]  [facility] [address] [place] [address] Phone: [phone] Fax: [phone]     Social Determinants of Health (SDOH) Social History: SDOH Screenings   Alcohol Screen: Low Risk  (10/12/2019)  Depression (PHQ2-9): Medium Risk (04/29/2022)  Tobacco Use: Low Risk  (03/07/2023)   SDOH Interventions:     Readmission Risk Interventions     No data to display

## 2023-03-10 NOTE — Progress Notes (Signed)
Palliative-   Consult received, chart reviewed.   Called patient's listed contact- [name]- no answer and no opportunity to leave a message. Will try to reach her again tomorrow.   Ocie Bob, AGNP-C Palliative Medicine  No charge

## 2023-03-10 NOTE — Assessment & Plan Note (Addendum)
Overnight 5/20, requiring repeated I/O cath, >1L return both times. Patient with foley catheter in place, will need outpatient voiding trial.  Added Flomax.

## 2023-03-10 NOTE — Progress Notes (Signed)
Physical Therapy Treatment Patient Details Name: Christian [name] MRN: [id number] DOB: 1940/12/23 Today's Date: 03/10/2023   History of Present Illness 82 y.o. male adm 5/18 with SOB x1 month. Pt with multiple bil segmental PE, started on heparin and CHF exacerbation. PMHx: R PCA CVA, HFrEF, CAD, HTN, GERD, Hernia of abdominal cavity, HLD, Neuropathy, DM II.    PT Comments    Pt pleasant, HOH with some baseline confusion. Pt able to increase mobility on RA with use of RW and cues for safety/direction. Pt educated for HEP and activity progression with encouragement to be OOB for meals and all toileting needs. Will continue to follow with D/C plan appropriate.    Recommendations for follow up therapy are one component of a multi-disciplinary discharge planning process, led by the attending physician.  Recommendations may be updated based on patient status, additional functional criteria and insurance authorization.  Follow Up Recommendations       Assistance Recommended at Discharge Intermittent Supervision/Assistance  Patient can return home with the following A little help with walking and/or transfers;A little help with bathing/dressing/bathroom;Assistance with cooking/housework;Direct supervision/assist for medications management;Direct supervision/assist for financial management;Assist for transportation;Help with stairs or ramp for entrance   Equipment Recommendations  None recommended by PT    Recommendations for Other Services       Precautions / Restrictions Precautions Precautions: Fall Precaution Comments: watch SpO2     Mobility  Bed Mobility Overal bed mobility: Needs Assistance Bed Mobility: Supine to Sit, Sit to Supine     Supine to sit: Supervision Sit to supine: Supervision   General bed mobility comments: supervision for lines with cues for direction. pt able to transition to sitting and return to supine without physical assist. Pt also able to use bil rails to  pull himself toward [facility]    Transfers Overall transfer level: Needs assistance   Transfers: Sit to/from Stand Sit to Stand: Min guard           General transfer comment: guarding with cues for hand placement. pt able to stand from bed with RW present and then perform 5 repeated sit to stands at EOB without RW present with guarding in 20 sec    Ambulation/Gait Ambulation/Gait assistance: Supervision Gait Distance (Feet): 150 Feet Assistive device: Rolling walker (2 wheels) Gait Pattern/deviations: Step-through pattern, Decreased stride length   Gait velocity interpretation: 1.31 - 2.62 ft/sec, indicative of limited community ambulator   General Gait Details: cues for posture, proximity to RW and direction. Pt with increased stride and stabiltiy with use of RW. HR max 133 with gait with fluctuation from 105-133, SPO2 88-95% on RA   Stairs             Wheelchair Mobility    Modified Rankin (Stroke Patients Only)       Balance Overall balance assessment: Needs assistance Sitting-balance support: No upper extremity supported Sitting balance-Leahy Scale: Good Sitting balance - Comments: EOB without support   Standing balance support: Bilateral upper extremity supported Standing balance-Leahy Scale: Poor Standing balance comment: RW for gait                            Cognition Arousal/Alertness: Awake/alert Behavior During Therapy: WFL for tasks assessed/performed Overall Cognitive Status: Impaired/Different from baseline Area of Impairment: Orientation, Memory                 Orientation Level: Disoriented to, Time   Memory: Decreased short-term memory Following Commands: Follows  one step commands consistently     Problem Solving: Slow processing          Exercises General Exercises - Lower Extremity Long Arc Quad: AROM, Both, 20 reps, Seated Hip Flexion/Marching: AROM, Both, 20 reps, Seated    General Comments         Pertinent Vitals/Pain Pain Assessment Pain Assessment: No/denies pain    Home Living                          Prior Function            PT Goals (current goals can now be found in the care plan section) Progress towards PT goals: Progressing toward goals    Frequency    Min 1X/week      PT Plan Current plan remains appropriate    Co-evaluation              AM-PAC PT "6 Clicks" Mobility   Outcome Measure  Help needed turning from your back to your side while in a flat bed without using bedrails?: None Help needed moving from lying on your back to sitting on the side of a flat bed without using bedrails?: None Help needed moving to and from a bed to a chair (including a wheelchair)?: A Little Help needed standing up from a chair using your arms (e.g., wheelchair or bedside chair)?: A Little Help needed to walk in hospital room?: A Little Help needed climbing 3-5 steps with a railing? : A Little 6 Click Score: 20    End of Session   Activity Tolerance: Patient tolerated treatment well Patient left: in bed;with call bell/phone within reach (return to bed for foley placement per RN request) Nurse Communication: Mobility status PT Visit Diagnosis: Other abnormalities of gait and mobility (R26.89)     Time: 1610-9604 PT Time Calculation (min) (ACUTE ONLY): 27 min  Charges:  $Gait Training: 8-22 mins $Therapeutic Activity: 8-22 mins                     Christian [name], PT Acute Rehabilitation Services Office: [phone]    Christian [name] Christian [name] 03/10/2023, 10:20 AM

## 2023-03-10 NOTE — Progress Notes (Addendum)
Advanced Heart Failure Rounding Note  PCP-Cardiologist: Kristeen Miss, MD   Subjective:   5/20 Started on DBA and diuresed with IV lasix. Lactic acid 1.8.   Overnight in and out of A fib RVR.   DBA 2.5 mcg. CO-OX 64%. Brisk diuresis noted. Negative >3.9 liters   Denies SOB.    Objective:   Weight Range: 67.2 kg Body mass index is 23.91 kg/m.   Vital Signs:   Temp:  [97.6 F (36.4 C)-97.8 F (36.6 C)] 97.6 F (36.4 C) (05/21 0711) Pulse Rate:  [84-139] 98 (05/21 0711) Resp:  [13-25] 13 (05/21 0711) BP: (86-123)/(54-92) 96/71 (05/21 0711) SpO2:  [87 %-100 %] 93 % (05/21 0400) Weight:  [67.2 kg] 67.2 kg (05/21 0434) Last BM Date : 03/09/23  Weight change: Filed Weights   03/08/23 0346 03/09/23 0402 03/10/23 0434  Weight: 72.6 kg 72.8 kg 67.2 kg    Intake/Output:   Intake/Output Summary (Last 24 hours) at 03/10/2023 0817 Last data filed at 03/10/2023 0333 Gross per 24 hour  Intake --  Output 3900 ml  Net -3900 ml     CVP 3-4  Physical Exam    General:   No resp difficulty HEENT: Normal Neck: Supple. JVP flat  . Carotids 2+ bilat; no bruits. No lymphadenopathy or thyromegaly appreciated. Cor: PMI nondisplaced. Tachy Irregular rate & rhythm. No rubs, gallops or murmurs. Lungs: Clear Abdomen: Soft, nontender, nondistended. No hepatosplenomegaly. No bruits or masses. Good bowel sounds. Extremities: No cyanosis, clubbing, rash, edema. RUE PICC  Neuro: Alert & orientedx3, cranial nerves grossly intact. moves all 4 extremities w/o difficulty. Affect pleasant   Telemetry   In and out of A fib RVR 100-140s.    EKG    N/A   Labs    CBC Recent Labs    03/07/23 0952 03/08/23 0231 03/09/23 0334 03/10/23 0420  WBC 9.1   < > 6.0 6.2  NEUTROABS 6.1  --   --   --   HGB 12.8*   < > 11.6* 12.8*  HCT 40.9   < > 37.5* 40.9  MCV 93.2   < > 92.4 90.5  PLT 217   < > 172 176   < > = values in this interval not displayed.   Basic Metabolic Panel Recent  Labs    03/07/23 0952 03/08/23 0231 03/09/23 0334 03/10/23 0420  NA 138   < > 138 140  K 3.7   < > 3.6 3.5  CL 102   < > 103 98  CO2 25   < > 25 29  GLUCOSE 139*   < > 118* 122*  BUN 26*   < > 20 15  CREATININE 1.21   < > 1.18 1.13  CALCIUM 8.3*   < > 8.1* 8.6*  MG 1.9  --   --   --    < > = values in this interval not displayed.   Liver Function Tests Recent Labs    03/08/23 0231  AST 20  ALT 16  ALKPHOS 41  BILITOT 1.6*  PROT 5.2*  ALBUMIN 3.1*   No results for input(s): "LIPASE", "AMYLASE" in the last 72 hours. Cardiac Enzymes No results for input(s): "CKTOTAL", "CKMB", "CKMBINDEX", "TROPONINI" in the last 72 hours.  BNP: BNP (last 3 results) Recent Labs    03/07/23 0952  BNP 2,668.0*    ProBNP (last 3 results) No results for input(s): "PROBNP" in the last 8760 hours.   D-Dimer Recent Labs  03/07/23 0952  DDIMER 3.06*   Hemoglobin A1C No results for input(s): "HGBA1C" in the last 72 hours. Fasting Lipid Panel No results for input(s): "CHOL", "HDL", "LDLCALC", "TRIG", "CHOLHDL", "LDLDIRECT" in the last 72 hours. Thyroid Function Tests Recent Labs    03/07/23 0952  TSH 2.160    Other results:   Imaging    [place] EKG SITE RITE  Result Date: 03/09/2023 If Site Rite image not attached, placement could not be confirmed due to current cardiac rhythm.    Medications:     Scheduled Medications:  ALPRAZolam  0.5 mg Oral Once   apixaban  10 mg Oral BID   Followed by   Melene Muller ON 03/15/2023] apixaban  5 mg Oral BID   Chlorhexidine Gluconate Cloth  6 each Topical Daily   escitalopram  20 mg Oral Daily   ezetimibe  10 mg Oral Daily   insulin aspart  0-15 Units Subcutaneous TID WC   insulin aspart  0-5 Units Subcutaneous QHS   rosuvastatin  40 mg Oral Daily   sodium chloride flush  10-40 mL Intracatheter Q12H    Infusions:  DOBUTamine 2.5 mcg/kg/min (03/09/23 1855)    PRN Medications: acetaminophen **OR** acetaminophen, calcium carbonate,  metoprolol tartrate, ondansetron **OR** ondansetron (ZOFRAN) IV, sodium chloride flush    Patient Profile   82 y.o. male with history of NICM/chronic systolic CHF with recovered EF, nonobstructive CAD, hx CVA, HTN, HLD, carotid artery disease. Admitted with acute bilateral PEs and a/c CHF.   Assessment/Plan   Acute on chronic systolic CHF: - Known hx CHF with prior EF < 20% in 2018. LHC with nonobstructive CAD. - LVEF improved to 60-65% on echo 07/2018 with GDMT - Echo 05/21: EF 60-65% - Echo 03/07/23: EF < 20%, RV moderately reduced, mild to moderate MR, low-flow low-gradient  moderate AS with mean gradient 6 mmhg, AVA 1 cm2, DI 0.26, very low SV index, dilated IVC with estimated RAP 15 - Given conduction issues + low flow AS will need to check for amyloids. -Check myeloma panel+ urine immunofixation. Will need PYP once stablized.   -  Beta blocker stopped d/t concern for low-output -  DBA 2.5. CO-OX 64%. Cut back DBA to 1.5 mcg.  - CVP down to 3. Hold diuretics today. - SBP soft. No room for GDMT.    2. Bilateral PEs: - Suspect d/t low flow in setting of severe biventricular heart failure - Heparin off. Now on Eliquis.   3. Nonobstructive CAD/elevated troponin: - Nonobstructive CAD on LHC in 2018 - Troponin 409>811>914 - Likely demand ischemia in setting of PE and CHF   4. Aortic valve stenosis: - Echo 03/07/23: EF < 20%, low-flow low-gradient  moderate AS with mean gradient 6 mmhg, AVA 1 cm2, DI 0.26, very low SV index, dilated IVC with estimated RAP 15   5. ?Hepatic cirrhosis/liver lesion: - Noted on CTA.  - Abdomen MRI w/ and w/o contrast recommended   6. Cognitive impairment: - Worsening since prior stroke in 2021  7.  A Fib RVR -In and out all night with rates up to the 140s  -Load amio.  -On eliquis.   8. GOC DNR    Length of Stay: 3  Amy Clegg, NP  03/10/2023, 8:17 AM  Advanced Heart Failure Team Pager [phone] (M-F; 7a - 5p)  Please contact [facility] for night-coverage after hours (5p -7a ) and weekends on [email or web address]  Patient seen and examined with the above-signed Advanced Practice Provider and/or Housestaff. I personally  reviewed laboratory data, imaging studies and relevant notes. I independently examined the patient and formulated the important aspects of the plan. I have edited the note to reflect any of my changes or salient points. I have personally discussed the plan with the patient and/or family.  Confused at times. Now on DBA 2.5. Has diuresed well. Out over 4L. Co-ox 64% CVP 3  In/out AF with RVR.  General:  Elderly No resp difficulty HEENT: normal Neck: supple. no JVD. Cor: PMI nondisplaced. Irregular 2/6 AS Lungs: clear Abdomen: soft, nontender, nondistended. No hepatosplenomegaly. No bruits or masses. Good bowel sounds. Extremities: no cyanosis, clubbing, rash, edema Neuro: alert & oriented, cranial nerves grossly intact. moves all 4 extremities w/o difficulty. Affect pleasant  Much improved with DBA. Can stop IV lasix. Will continue DBA for now. Continue Eliquis.Hopefully he will equilibrate slowly and we can get him on GDMT. Avoiding RHC with fresh PE burden. Can consider cMRI.   Options very limited. DOubt role for TAVR in setting of severe biventricular dysfunction.   Arvilla Meres, MD  9:36 AM

## 2023-03-10 NOTE — Progress Notes (Signed)
Heart Failure Navigator Progress Note  Assessed for Heart & Vascular TOC clinic readiness.  Patient does not meet criteria due to Advanced Heart Failure Team consulted. .   Navigator will sign off at this time.   Piper Albro, BSN, RN Heart Failure Nurse Navigator Secure Chat Only   

## 2023-03-10 NOTE — Progress Notes (Signed)
Progress Note   Patient: Christian [name] DOB: 02/08/1941 DOA: 03/07/2023     3 DOS: the patient was seen and examined on 03/10/2023 at 8:59 AM      Brief hospital course: Christian [name] is an 82 y.o. M with hx sCHF EF 20% (recovered to 60-65% on 2021 echo), NICM, stroke, HTN, and DM who presented with SOB over several months, now 2 days severe SOB --> bilateral PE and CHF.     5/18: Admitted on heparin 5/19: Lasix IV started for CHF, echo shows EF back down to <20% 5/20: Cardiology consulted, Adv HF consulted, transferred to progressive for Dobutamine 5/21: Tachyarrhythmias overnight, amiodarone started     Assessment and Plan: * Acute on chronic combined systolic and diastolic CHF (congestive heart failure) (HCC) Presented with SOB, effusions and edema on CXR.  In 2019 admitted for CHF, had EF down to 25%, started on GDMT and EF recovered to 65%, and was there at most recent echo 2021.  Advanced heart failure team consult yesterday, placed back, started dobutamine. Net -3.9 L yesterday, SCV O2 64% this morning, no clinical change - Lasix, dobutamine, amiodarone per cardiology -Avoid beta-blocker and decompensated HF - Other GDMT per cardiology - Potassium to stay >4 -Daily weights, strict ins and outs - Daily BMP      Acute pulmonary embolism without acute cor pulmonale (HCC) CTA chest on admission showed bilateral PE.    No indication for lytics. - Continue new Eliquis    Paroxysmal atrial fibrillation (HCC) This is new onset.  TSH normal. Echo without valvular disease. CHA2DS2-Vasc 7 (age, CHF, HTN, DM, prior CVA, PVD).     Tachycardic overnight on dobutamine  - Amiodarone per cardiology - Continue new Eliquis  Liver lesion CTA on admission showed radiographic indications of cirrhosis and known right heart failure as well as 2.3 cm indeterminate lesion. - Obtain MRI abdomen pending goals of care and outcome of CHF workup  Mood disorder (HCC) -  Continue Lexapro  Cerebrovascular disease See above  Benign essential HTN Blood pressure low - Antihypertensives/guideline directed CHF therapy per cardiology  Coronary artery disease involving native coronary artery of native heart without angina pectoris PTA Aspirin and Plavix stopped here.  - Continue Zetia   Type 2 diabetes mellitus with complication, without long-term current use of insulin (HCC) A1c 7.6% last Jan Glucose controlled - SS corrections - Hold metformin  Hyperlipidemia - Continue Zetia          Subjective: Transferred downstairs last night and PICC line placed for dobutamine.  Patient feels well.  He has no dyspnea, chest pain.  He had tachycardia overnight and then cardiology started amiodarone this morning.     Physical Exam: BP 96/71 (BP Location: Left Leg)   Pulse 98   Temp 97.6 F (36.4 C) (Oral)   Resp 13   Wt 67.2 kg   SpO2 93%   BMI 23.91 kg/m   Elderly adult male, lying in bed, very hard of hearing but responsive to questions slowly Irregularly irregular, tachycardic, I do not appreciate murmurs, the lower extremity edema appears resolved, I do not see JVD Respiratory rate seems normal, lung sounds overall diminished, without rales or wheezes Abdomen soft without tenderness palpation or guarding Slow responses Christian [name] hard of hearing, makes eye contact, short-term memory, generalized weakness, speech fluent, face symmetric    Data Reviewed: Basic metabolic panel shows stable renal function CBC shows mild anemia only  Family Communication: Will call ex-wife [name] later today  Disposition: Status is: Inpatient She was admitted with bilateral PE, congestive heart failure and new onset atrial fibrillation  Cardiology was consulted due to reduced EF, they started dobutamine.  Defer to cardiology regarding endpoint for dobutamine, will consult palliative care, unclear if he is able to return home alone will need SNF at  discharge, although that is several days away for sure        Author: Alberteen Sam, MD 03/10/2023 9:05 AM  For on call review [email or web address].

## 2023-03-10 NOTE — Care Management Important Message (Signed)
Important Message  Patient Details  Name: Christian [name] MRN: [id number] Date of Birth: Feb 29, 1941   Medicare Important Message Given:  Yes     Dorena Bodo 03/10/2023, 2:37 PM

## 2023-03-10 NOTE — Progress Notes (Addendum)
Occupational Therapy Treatment Patient Details Name: Christian [name] MRN: [id number] DOB: [birth year] Today's Date: 03/10/2023   History of present illness 82 y.o. male adm 5/18 with SOB x1 month. Pt with multiple bil segmental PE, started on heparin and CHF exacerbation. PMHx: R PCA CVA, HFrEF, CAD, HTN, GERD, Hernia of abdominal cavity, HLD, Neuropathy, DM II.   OT comments  Pt progressing towards goals this session, continues to be limited by impaired cognition vs HOH. Pt needing frequent repetition of instructions throughout. Pt following 3 step command with mod cues, increased cuing needed for higher level cognition task of letter cancellation. Pt at times hitting L side of door frame and door with RW. Pt completing ADLs with min guard-max A , supervision for bed mobility, and min guard A for transfers with RW. Pt presenting with impairments listed below, will follow acutely. Per notes, ex-wife not able to provide frequent assist/supervision, updating d/c recommendation due to decreased support at home. Patient will benefit from continued inpatient follow up therapy, <3 hours/day to maximize safety/independence with ADLs/functional mobility.    Recommendations for follow up therapy are one component of a multi-disciplinary discharge planning process, led by the attending physician.  Recommendations may be updated based on patient status, additional functional criteria and insurance authorization.    Assistance Recommended at Discharge Frequent or constant Supervision/Assistance  Patient can return home with the following  A little help with walking and/or transfers;A little help with bathing/dressing/bathroom;Assistance with cooking/housework;Direct supervision/assist for medications management;Direct supervision/assist for financial management;Assist for transportation;Help with stairs or ramp for entrance   Equipment Recommendations  Other (comment) (RW)    Recommendations for Other Services  PT consult    Precautions / Restrictions Precautions Precautions: Fall Precaution Comments: watch SpO2 Restrictions Weight Bearing Restrictions: No       Mobility Bed Mobility Overal bed mobility: Needs Assistance Bed Mobility: Supine to Sit, Sit to Supine     Supine to sit: Supervision Sit to supine: Supervision        Transfers Overall transfer level: Needs assistance Equipment used: Rolling walker (2 wheels) Transfers: Sit to/from Stand Sit to Stand: Min guard                 Balance Overall balance assessment: Needs assistance Sitting-balance support: No upper extremity supported Sitting balance-Leahy Scale: Good Sitting balance - Comments: EOB without support   Standing balance support: Bilateral upper extremity supported Standing balance-Leahy Scale: Poor Standing balance comment: RW for gait                           ADL either performed or assessed with clinical judgement   ADL Overall ADL's : Needs assistance/impaired     Grooming: Oral care;Wash/dry face;Standing;Min guard               Lower Body Dressing: Moderate assistance;Sitting/lateral leans Lower Body Dressing Details (indicate cue type and reason): posterior LOB when sitting EOB to don/doff socks Toilet Transfer: Min guard;Ambulation;Regular Toilet;Rolling walker (2 wheels)   Toileting- Clothing Manipulation and Hygiene: Maximal assistance Toileting - Clothing Manipulation Details (indicate cue type and reason): pericare in standing     Functional mobility during ADLs: Min guard;Rolling walker (2 wheels)      Extremity/Trunk Assessment Upper Extremity Assessment Upper Extremity Assessment: Generalized weakness   Lower Extremity Assessment Lower Extremity Assessment: Defer to PT evaluation        Vision   Vision Assessment?: No apparent visual deficits Additional Comments: noted  pt running RW into L side of door frame and door x2 during session, vision  appears WFL, able to read clock on wall and read short paragraph with glasses on   Perception Perception Perception: Not tested   Praxis Praxis Praxis: Not tested    Cognition Arousal/Alertness: Awake/alert Behavior During Therapy: WFL for tasks assessed/performed Overall Cognitive Status: Impaired/Different from baseline Area of Impairment: Orientation, Memory                 Orientation Level: Situation   Memory: Decreased short-term memory Following Commands: Follows one step commands with increased time (with repetition of command) Safety/Judgement: Decreased awareness of safety Awareness: Emergent Problem Solving: Slow processing General Comments: pt needing frequent repetition of instruction (<1 min of giving initial instruction). Pt with impaired problem solving, needing 3 attempts to follow command of cross off letter A's present in a short paragraph        Exercises      Shoulder Instructions       General Comments VSS on RA throughout    Pertinent Vitals/ Pain       Pain Assessment Pain Assessment: No/denies pain  Home Living Family/patient expects to be discharged to:: Private residence Living Arrangements: Alone                                      Prior Functioning/Environment              Frequency  Min 1X/week        Progress Toward Goals  OT Goals(current goals can now be found in the care plan section)  Progress towards OT goals: Progressing toward goals  Acute Rehab OT Goals Patient Stated Goal: none stated OT Goal Formulation: With patient Time For Goal Achievement: 03/23/23 Potential to Achieve Goals: Good ADL Goals Pt Will Perform Upper Body Dressing: with modified independence;sitting Pt Will Perform Lower Body Dressing: with modified independence;sitting/lateral leans;sit to/from stand Pt Will Transfer to Toilet: with modified independence;ambulating;regular height toilet Additional ADL Goal #1: pt  will follow 3 step command in prep for ADLs.  Plan Discharge plan needs to be updated;Frequency remains appropriate    Co-evaluation                 AM-PAC OT "6 Clicks" Daily Activity     Outcome Measure   Help from another person eating meals?: None Help from another person taking care of personal grooming?: A Little Help from another person toileting, which includes using toliet, bedpan, or urinal?: A Little Help from another person bathing (including washing, rinsing, drying)?: A Lot Help from another person to put on and taking off regular upper body clothing?: A Little Help from another person to put on and taking off regular lower body clothing?: A Lot 6 Click Score: 17    End of Session Equipment Utilized During Treatment: Gait belt;Rolling walker (2 wheels)  OT Visit Diagnosis: Unsteadiness on feet (R26.81);Other abnormalities of gait and mobility (R26.89);Muscle weakness (generalized) (M62.81)   Activity Tolerance Patient tolerated treatment well   Patient Left in chair;with call bell/phone within reach;with chair alarm set   Nurse Communication Mobility status        Time: 4098-1191 OT Time Calculation (min): 42 min  Charges: OT General Charges $OT Visit: 1 Visit OT Treatments $Self Care/Home Management : 23-37 mins $Therapeutic Activity: 8-22 mins  Javarius Tsosie K, OTD, OTR/L SecureChat Preferred Acute Rehab ([phone])  [phone]   Dalphine Handing 03/10/2023, 4:35 PM

## 2023-03-11 ENCOUNTER — Ambulatory Visit (HOSPITAL_COMMUNITY): Payer: Medicare Other

## 2023-03-11 DIAGNOSIS — Z515 Encounter for palliative care: Secondary | ICD-10-CM | POA: Diagnosis not present

## 2023-03-11 DIAGNOSIS — R45851 Suicidal ideations: Secondary | ICD-10-CM

## 2023-03-11 DIAGNOSIS — F32A Depression, unspecified: Secondary | ICD-10-CM

## 2023-03-11 DIAGNOSIS — I5043 Acute on chronic combined systolic (congestive) and diastolic (congestive) heart failure: Secondary | ICD-10-CM | POA: Diagnosis not present

## 2023-03-11 LAB — BASIC METABOLIC PANEL
Anion gap: 10 (ref 5–15)
BUN: 20 mg/dL (ref 8–23)
CO2: 29 mmol/L (ref 22–32)
Calcium: 8.1 mg/dL — ABNORMAL LOW (ref 8.9–10.3)
Chloride: 93 mmol/L — ABNORMAL LOW (ref 98–111)
Creatinine, Ser: 1.19 mg/dL (ref 0.61–1.24)
GFR, Estimated: >60 mL/min (ref >60)
Glucose, Bld: 258 mg/dL — ABNORMAL HIGH (ref 70–99)
Potassium: 3.9 mmol/L (ref 3.5–5.1)
Sodium: 132 mmol/L — ABNORMAL LOW (ref 135–145)

## 2023-03-11 LAB — CBC
HCT: 36.7 % — ABNORMAL LOW (ref 39.0–52.0)
Hemoglobin: 11.5 g/dL — ABNORMAL LOW (ref 13.0–17.0)
MCH: 28.6 pg (ref 26.0–34.0)
MCHC: 31.3 g/dL (ref 30.0–36.0)
MCV: 91.3 fL (ref 80.0–100.0)
Platelets: 162 10*3/uL (ref 150–400)
RBC: 4.02 MIL/uL — ABNORMAL LOW (ref 4.22–5.81)
RDW: 14.9 % (ref 11.5–15.5)
WBC: 6.7 10*3/uL (ref 4.0–10.5)
nRBC: 0 % (ref 0.0–0.2)

## 2023-03-11 LAB — GLUCOSE, CAPILLARY
Glucose-Capillary: 149 mg/dL — ABNORMAL HIGH (ref 70–99)
Glucose-Capillary: 136 mg/dL — ABNORMAL HIGH (ref 70–99)
Glucose-Capillary: 144 mg/dL — ABNORMAL HIGH (ref 70–99)
Glucose-Capillary: 157 mg/dL — ABNORMAL HIGH (ref 70–99)

## 2023-03-11 LAB — MAGNESIUM: Magnesium: 1.8 mg/dL (ref 1.7–2.4)

## 2023-03-11 LAB — COOXEMETRY PANEL
Carboxyhemoglobin: 2.2 % — ABNORMAL HIGH (ref 0.5–1.5)
Carboxyhemoglobin: 0.7 % (ref 0.5–1.5)
Methemoglobin: <0.7 % (ref 0.0–1.5)
Methemoglobin: <0.7 % (ref 0.0–1.5)
O2 Saturation: 57.7 %
O2 Saturation: 53.5 %
Total hemoglobin: 11.8 g/dL — ABNORMAL LOW (ref 12.0–16.0)
Total hemoglobin: 13 g/dL (ref 12.0–16.0)

## 2023-03-11 MED ORDER — MIRTAZAPINE 7.5 MG PO TABS
7.5000 mg | ORAL_TABLET | Freq: Every day | ORAL | Status: DC
  Administered 2023-03-11: 7.5 mg via ORAL
  Filled 2023-03-11: qty 1

## 2023-03-11 MED ORDER — ALPRAZOLAM 0.25 MG PO TABS
0.2500 mg | ORAL_TABLET | Freq: Two times a day (BID) | ORAL | Status: DC | PRN
  Administered 2023-03-11 – 2023-03-18 (×3): 0.25 mg via ORAL
  Filled 2023-03-11 (×3): qty 1

## 2023-03-11 MED ORDER — TORSEMIDE 20 MG PO TABS
40.0000 mg | ORAL_TABLET | Freq: Every day | ORAL | Status: DC
  Administered 2023-03-11 – 2023-03-12 (×2): 40 mg via ORAL
  Filled 2023-03-11 (×2): qty 2

## 2023-03-11 MED ORDER — AMIODARONE HCL 200 MG PO TABS
200.0000 mg | ORAL_TABLET | Freq: Two times a day (BID) | ORAL | Status: DC
  Administered 2023-03-11 – 2023-03-12 (×3): 200 mg via ORAL
  Filled 2023-03-11 (×3): qty 1

## 2023-03-11 MED ORDER — MORPHINE SULFATE (CONCENTRATE) 10 MG/0.5ML PO SOLN
2.5000 mg | ORAL | Status: DC | PRN
  Administered 2023-03-11: 2.6 mg via ORAL
  Filled 2023-03-11: qty 0.5

## 2023-03-11 MED ORDER — POTASSIUM CHLORIDE CRYS ER 20 MEQ PO TBCR
40.0000 meq | EXTENDED_RELEASE_TABLET | Freq: Every day | ORAL | Status: DC
  Administered 2023-03-11 – 2023-03-20 (×10): 40 meq via ORAL
  Filled 2023-03-11 (×10): qty 2
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
## 2023-03-11 NOTE — TOC Progression Note (Addendum)
Transition of Care [facility]) - Progression Note    Patient Details  Name: Christian [name] MRN: [id number] Date of Birth: Jul 10, 1941  [facility] CM/SW Contact  Elliot Cousin, RN Phone Number: 03/11/2023, 2:28 PM  Clinical Narrative:   HF TOC CM spoke to pt and requested CM contact ex-wife, [name]. Spoke to pt's ex-wife. States RW with seat will give pt a chance to sit if he get tired. Contacted [employer] rep, Jermaine for [employer] for home. Still monitoring if pt will need home oxygen. Will need current ambulatory walking saturation prior to dc for home oxygen. Wife states she is talking with Palliative about long-term plan for home.   Ex-wife will provide transportation home.     Expected Discharge Plan: Home w Home Health Services Barriers to Discharge: Continued Medical Work up  Expected Discharge Plan and Services   Discharge Planning Services: CM Consult Post Acute Care Choice: Home Health Living arrangements for the past 2 months: Single Family Home                 DME Arranged: 3-N-1 DME Agency: [employer] Date DME Agency Contacted: 03/11/23 Time DME Agency Contacted: 1428 Representative spoke with at DME Agency: Shaune Leeks HH Arranged: PT, RN [facility] Agency: [facility] Date [facility] Agency Contacted: 03/10/23 Time HH Agency Contacted: [phone] Representative spoke with at [facility] Agency: Lorenza Chick   Social Determinants of Health (SDOH) Interventions SDOH Screenings   Food Insecurity: No Food Insecurity (03/10/2023)  Housing: Low Risk  (03/10/2023)  Transportation Needs: No Transportation Needs (03/10/2023)  Utilities: Not At Risk (03/10/2023)  Alcohol Screen: Low Risk  (10/12/2019)  Depression (PHQ2-9): Medium Risk (04/29/2022)  Financial Resource Strain: Low Risk  (03/10/2023)  Tobacco Use: Low Risk  (03/07/2023)    Readmission Risk Interventions     No data to display

## 2023-03-11 NOTE — Progress Notes (Signed)
PROGRESS NOTE    Christian [name]  XBJ:[id number] DOB: 18-Jun-1941 DOA: 03/07/2023 PCP: [name], PA  [name] with history of chronic systolic CHF with recovered EF, NICM, CVA, hypertension, type 2 diabetes mellitus,?  Early dementia presented to the ED with dyspnea on exertion X several months worsened over 2 days. -Workup in the ED noted bilateral pulmonary emboli and CHF -Admitted, 5/18 started on IV heparin -5/19, diuretics started, echo noted EF down to<20% -5/20 advanced heart failure team consulted, transferred to SDU for dobutamine -5/21 tachyarrhythmia started on amiodarone palliative care consulted   Subjective: -Feels fair, asking for anxiety meds  Assessment and Plan:  Acute on chronic combined systolic and diastolic CHF - -EF previously <30% in 2018, improved to 60-65% in 07/2018  -Repeat echo this admission with <20%, moderately reduced RV, moderate aortic stenosis  -LHC 2018 with nonobstructive CAD -Heart failure team following, started on dobutamine for low output, beta-blocker discontinued  -GDMT limited by hypotension  -Myeloma panel, immunofixation pending -Diuretics now on hold,?  Avoid SGLT2i with urinary retention/Foley  Acute pulmonary embolism without acute cor pulmonale CTA chest on admission showed bilateral PE. - Continue new Eliquis  Goals of care: Frail elderly male with severe cardiomyopathy and cognitive deficits with ongoing FTT, palliative consulted  Paroxysmal atrial fibrillation (HCC) This is new onset.  TSH normal. Echo without valvular disease. CHA2DS2-Vasc 7  -Was on IV amiodarone, now converted to sinus rhythm, changed to oral amiodarone, continue Eliquis  Acute urinary retention Overnight 5/20, requiring repeated I/O cath, >1L return both times. -Foley catheter placed 5/21  Liver lesion CTA on admission showed radiographic indications of cirrhosis and known right heart failure as well as 2.3 cm indeterminate lesion. - Obtain MRI  abdomen pending goals of care and outcome of CHF workup  Mood disorder (HCC) - Continue Lexapro  Cerebrovascular disease See above  Benign essential HTN Blood pressure low - Antihypertensives/guideline directed CHF therapy per cardiology  CAD PTA Aspirin and Plavix stopped here.  Now on apixaban - Continue Zetia  Type 2 diabetes mellitus with complication, without long-term current use of insulin (HCC) A1c 7.6% last Jan -stable, SSI - Hold metformin  Hyperlipidemia - Continue Zetia  DVT prophylaxis: Apixaban Code Status: DNR Family Communication: None present Disposition Plan: Home likely 1 to 2 days  Consultants: Heart failure team   Procedures:   Antimicrobials:    Objective: Vitals:   03/11/23 0400 03/11/23 0418 03/11/23 0657 03/11/23 0800  BP: 99/75   103/75  Pulse: 99 87 86 88  Resp: 20 20 (!) 21 13  Temp:  97.7 F (36.5 C)    TempSrc:  Oral    SpO2: 99% 98% 98% 92%  Weight:   67.7 kg   Height:        Intake/Output Summary (Last 24 hours) at 03/11/2023 1041 Last data filed at 03/11/2023 0701 Gross per 24 hour  Intake 744.42 ml  Output 1050 ml  Net -305.58 ml   Filed Weights   03/09/23 0402 03/10/23 0434 03/11/23 0657  Weight: 72.8 kg 67.2 kg 67.7 kg    Examination:  General exam: Chronically ill elderly male laying in bed, hard of hearing, cognitive deficits, oriented x 2 CVS: S1-S2, irregular rhythm Lungs: Diminished breath sounds Abdomen: Soft, nontender, bowel sounds present Extremities: No edema  Psychiatry: Poor insight and judgment, cognitive deficits    Data Reviewed:   CBC: Recent Labs  Lab 03/07/23 0952 03/08/23 0231 03/09/23 0334 03/10/23 0420 03/11/23 0352  WBC 9.1 6.6 6.0  6.2 6.7  NEUTROABS 6.1  --   --   --   --   HGB 12.8* 11.9* 11.6* 12.8* 11.5*  HCT 40.9 38.3* 37.5* 40.9 36.7*  MCV 93.2 91.8 92.4 90.5 91.3  PLT 217 184 172 176 162   Basic Metabolic Panel: Recent Labs  Lab 03/07/23 0952 03/08/23 0231  03/09/23 0334 03/10/23 0420 03/11/23 0352  NA 138 136 138 140 132*  K 3.7 3.4* 3.6 3.5 3.9  CL 102 102 103 98 93*  CO2 25 22 25 29 29   GLUCOSE 139* 90 118* 122* 258*  BUN 26* 19 20 15 20   CREATININE 1.21 1.09 1.18 1.13 1.19  CALCIUM 8.3* 8.2* 8.1* 8.6* 8.1*  MG 1.9  --   --   --  1.8   GFR: Estimated Creatinine Clearance: 43.2 mL/min (by C-G formula based on SCr of 1.19 mg/dL). Liver Function Tests: Recent Labs  Lab 03/08/23 0231  AST 20  ALT 16  ALKPHOS 41  BILITOT 1.6*  PROT 5.2*  ALBUMIN 3.1*   No results for input(s): "LIPASE", "AMYLASE" in the last 168 hours. No results for input(s): "AMMONIA" in the last 168 hours. Coagulation Profile: No results for input(s): "INR", "PROTIME" in the last 168 hours. Cardiac Enzymes: No results for input(s): "CKTOTAL", "CKMB", "CKMBINDEX", "TROPONINI" in the last 168 hours. BNP (last 3 results) No results for input(s): "PROBNP" in the last 8760 hours. HbA1C: No results for input(s): "HGBA1C" in the last 72 hours. CBG: Recent Labs  Lab 03/10/23 0615 03/10/23 1124 03/10/23 1603 03/10/23 2116 03/11/23 0654  GLUCAP 120* 163* 179* 158* 149*   Lipid Profile: No results for input(s): "CHOL", "HDL", "LDLCALC", "TRIG", "CHOLHDL", "LDLDIRECT" in the last 72 hours. Thyroid Function Tests: No results for input(s): "TSH", "T4TOTAL", "FREET4", "T3FREE", "THYROIDAB" in the last 72 hours. Anemia Panel: No results for input(s): "VITAMINB12", "FOLATE", "FERRITIN", "TIBC", "IRON", "RETICCTPCT" in the last 72 hours. Urine analysis:    Component Value Date/Time   COLORURINE YELLOW 01/06/2018 1602   APPEARANCEUR Clear 04/07/2018 1459   LABSPEC 1.020 01/06/2018 1602   PHURINE 6.5 01/06/2018 1602   GLUCOSEU Negative 04/07/2018 1459   GLUCOSEU NEGATIVE 01/06/2018 1602   HGBUR NEGATIVE 01/06/2018 1602   BILIRUBINUR 1 01/22/2023 1143   BILIRUBINUR Negative 04/07/2018 1459   KETONESUR negative 10/06/2018 1634   KETONESUR TRACE (A)  01/06/2018 1602   PROTEINUR Positive (A) 01/22/2023 1143   PROTEINUR Negative 04/07/2018 1459   UROBILINOGEN 1.0 01/22/2023 1143   UROBILINOGEN 0.2 01/06/2018 1602   NITRITE Negative 01/22/2023 1143   NITRITE Negative 04/07/2018 1459   NITRITE NEGATIVE 01/06/2018 1602   LEUKOCYTESUR Negative 01/22/2023 1143   LEUKOCYTESUR Negative 04/07/2018 1459   Sepsis Labs: @LABRCNTIP (procalcitonin:4,lacticidven:4)  ) Recent Results (from the past 240 hour(s))  SARS Coronavirus 2 by RT PCR (hospital order, performed in [facility] hospital lab) *cepheid single result test* Anterior Nasal Swab     Status: None   Collection Time: 03/07/23  9:46 AM   Specimen: Anterior Nasal Swab  Result Value Ref Range Status   SARS Coronavirus 2 by RT PCR NEGATIVE NEGATIVE Final    Comment: Performed at [facility], [address] [place], [place] [address]     Radiology Studies: [place] EKG SITE RITE  Result Date: 03/09/2023 If [facility] image not attached, placement could not be confirmed due to current cardiac rhythm.    Scheduled Meds:  ALPRAZolam  0.5 mg Oral Once   amiodarone  200 mg Oral BID  apixaban  10 mg Oral BID   Followed by   Melene Muller ON 03/15/2023] apixaban  5 mg Oral BID   Chlorhexidine Gluconate Cloth  6 each Topical Daily   escitalopram  20 mg Oral Daily   ezetimibe  10 mg Oral Daily   insulin aspart  0-15 Units Subcutaneous TID WC   insulin aspart  0-5 Units Subcutaneous QHS   rosuvastatin  40 mg Oral Daily   sodium chloride flush  10-40 mL Intracatheter Q12H   Continuous Infusions:   LOS: 4 days    Time spent:    Zannie Cove, MD [facility]   03/11/2023, 10:41 AM

## 2023-03-11 NOTE — Consult Note (Addendum)
Consultation Note Date: 03/11/2023   Patient Name: Christian [name]  DOB: [birth year]  MRN: [id number]  Age / Sex: 82 y.o., male  PCP: [name], [place] Referring Physician: Zannie Cove, MD  Reason for Consultation:  end stage heart failure, goals of care  HPI/Patient Profile: 82 y.o. male  with past medical history of stroke with mild cognitive impairment, hoh, CHF, HTN, HLD, CAD, DM with neuropathy,  admitted on 03/07/2023 with bilateral PE and heart failure exacerbation. Required dobutamine for diuresis, now stopped. Palliative medicine consulted for GOC.    Primary Decision Maker PATIENT - surrogate decision maker is ex-spouse- [name], he has a sister that he has been estranged from for over 30 years, unable to be contacted- no other family- [name] is agreeable to be surrogate if needed  Discussion: Chart reviewed including labs, progress notes, imaging from this and previous encounters.   Spoke with patient's ex-spouse [name]. Patient lives at home alone with her assistance- she lives across the street. He has short term memory issues, she administers his medications. I reviewed patient's MOST form that was on file stating patient's wishes for comfort measures only- [name] shared that patient wanted interventions offered this hospitalization and declined comfort measures only. She also shared that patient has had a great deal of anxiety and depression and has expressed to her the possibility of suicide. She removed all of the guns from his home due to her worries.   I evaluated patient. He was oriented to person and place, however, told me that he lives in [place] which is not accurate. He was able to tell me he is in the hospital due to shortness of breath caused by his bad heart and he is receiving medication for it. He does feel he has improved, but he is still experiencing some shortness of  breath at rest that increases activity. We discussed using low dose morphine for symptom management for the cycle of shortness of breath and anxiety and he would like to try that.  I attempted goals of care discussion, however, when I asked what patient's preference would be for his care- he stated he wanted to "end it all". I asked if he meant that he just wanted to be at home, and be comfortable with hospice care without return to the hospital, and he said that no, he did not want hospice, that he just wants to go home to kill himself. I asked if he currently feels suicidal and he again stated that he did.  I asked him if he had a plan for how he would kill himself and he asked me if I could give him enough morphine then he would take it all and end his life.   I explained to him that physician assisted suicide is not legal. Emotional support provided, understanding he is frustrated with his current health state. I recommended that he meet with psychiatry due to his suicidal ideation for his own safety.    SUMMARY OF RECOMMENDATIONS -Unfortunately, cannot ethically hold goals of care due  to patient's suicidal state, recommend psychiatric evaluation for patient's safety -While his suicidal state is related to his chronic health- he needs adequate depression treatment before GOC can be addressed- per chart review has history of anxiety and depression that is not related to this acute admission. Per his ex-spouse he has expressed suicidality before this admission as well.  -Unclear the depth of his cognitive impairment- he has able to understand his illness and treatment options so I do believe he has capacity to discuss goals of care, but this can wax and wane- he would benefit from a full cognitive assessment- he was last seen by neurology in 2021 and was lost to followup- ex-spouse has scheduled him another appointment for August -Will prescribe low dose morphine 2.5mg  oral solution q2hr prn, while he  is here in hospital for SOB symptom, however, would not send him home with morphine prescription that he has access to due to admission that he would take it all in effort to kill himself   -Attempted to call [name] and update her on my discussion with Alastair- but received no answer and no opportunity to leave a message  Code Status/Advance Care Planning: DNR   Prognosis:   Unable to determine  Discharge Planning: To Be Determined  Primary Diagnoses: Present on Admission:  Acute on chronic combined systolic and diastolic CHF (congestive heart failure) (HCC)  Coronary artery disease involving native coronary artery of native heart without angina pectoris  Type 2 diabetes mellitus with complication, without long-term current use of insulin (HCC)  Hyperlipidemia  Benign essential HTN   Review of Systems  Respiratory:  Positive for shortness of breath.   Psychiatric/Behavioral:  Positive for dysphoric mood and suicidal ideas. The patient is nervous/anxious.     Physical Exam Vitals and nursing note reviewed.  Constitutional:      Appearance: He is ill-appearing.  Pulmonary:     Effort: Pulmonary effort is normal.  Neurological:     Mental Status: He is alert. Mental status is at baseline.  Psychiatric:     Comments: Flat affect     Vital Signs: BP 103/75   Pulse 88   Temp 97.7 F (36.5 C) (Oral)   Resp 13   Ht 5\' 6"  (1.676 m)   Wt 67.7 kg   SpO2 92%   BMI 24.09 kg/m  Pain Scale: 0-10   Pain Score: 0-No pain   SpO2: SpO2: 92 % O2 Device:SpO2: 92 % O2 Flow Rate: .O2 Flow Rate (L/min): 3 L/min  IO: Intake/output summary:  Intake/Output Summary (Last 24 hours) at 03/11/2023 1137 Last data filed at 03/11/2023 0701 Gross per 24 hour  Intake 744.42 ml  Output 450 ml  Net 294.42 ml    LBM: Last BM Date : 03/10/23 Baseline Weight: Weight: 71.5 kg Most recent weight: Weight: 67.7 kg       Thank you for this consult. Palliative medicine will continue to  follow and assist as needed.  Time Total: 90 minutes Greater than 50%  of this time was spent counseling and coordinating care related to the above assessment and plan.  Signed by: Ocie Bob, AGNP-C Palliative Medicine    Please contact Palliative Medicine Team phone at [phone] for questions and concerns.  For individual provider: See Loretha Stapler

## 2023-03-11 NOTE — Progress Notes (Signed)
Pt's urine noted to be dark red with a few small bloody clots; pt denies pain from catheter, and denied any pulling on tube. On call provider notified, no new orders at this time.

## 2023-03-11 NOTE — Progress Notes (Addendum)
Advanced Heart Failure Rounding Note  PCP-Cardiologist: Kristeen Miss, MD   Subjective:   5/20 Started on DBA and diuresed with IV lasix. Lactic acid 1.8.  5/21 DBA turned down to 1.5  CO-OX 58% on DBA 1.5.  Unable to obtain CVP. RN working on new setup.  Hematuria noted after foley insertion. Hgb stable 11.5.  Back in SR  Feeling okay this am. No dyspnea at rest.     Objective:   Weight Range: 67.7 kg Body mass index is 24.09 kg/m.   Vital Signs:   Temp:  [97.6 F (36.4 C)-98.1 F (36.7 C)] 97.7 F (36.5 C) (05/22 0418) Pulse Rate:  [84-130] 88 (05/22 0800) Resp:  [12-21] 13 (05/22 0800) BP: (88-105)/(67-80) 103/75 (05/22 0800) SpO2:  [90 %-100 %] 92 % (05/22 0800) Weight:  [67.7 kg] 67.7 kg (05/22 0657) Last BM Date : 03/10/23  Weight change: Filed Weights   03/09/23 0402 03/10/23 0434 03/11/23 0657  Weight: 72.8 kg 67.2 kg 67.7 kg    Intake/Output:   Intake/Output Summary (Last 24 hours) at 03/11/2023 0830 Last data filed at 03/10/2023 1545 Gross per 24 hour  Intake 469.01 ml  Output 1050 ml  Net -580.99 ml     Physical Exam    General:  Chronically ill appearing elderly male HEENT: HOH Neck: supple. no JVD. Carotids 2+ bilat; no bruits.  Cor: PMI nondisplaced. Regular rate & rhythm. No rubs, gallops or murmurs. Lungs: clear Abdomen: soft, nontender, nondistended.  Extremities: no cyanosis, clubbing, rash, edema, RUE PICC Neuro: alert & orientedx3, cranial nerves grossly intact. moves all 4 extremities w/o difficulty. Affect pleasant   Telemetry  SR 80s   EKG    N/A   Labs    CBC Recent Labs    03/10/23 0420 03/11/23 0352  WBC 6.2 6.7  HGB 12.8* 11.5*  HCT 40.9 36.7*  MCV 90.5 91.3  PLT 176 162   Basic Metabolic Panel Recent Labs    16/10/96 0420 03/11/23 0352  NA 140 132*  K 3.5 3.9  CL 98 93*  CO2 29 29  GLUCOSE 122* 258*  BUN 15 20  CREATININE 1.13 1.19  CALCIUM 8.6* 8.1*  MG  --  1.8   Liver Function  Tests No results for input(s): "AST", "ALT", "ALKPHOS", "BILITOT", "PROT", "ALBUMIN" in the last 72 hours.  No results for input(s): "LIPASE", "AMYLASE" in the last 72 hours. Cardiac Enzymes No results for input(s): "CKTOTAL", "CKMB", "CKMBINDEX", "TROPONINI" in the last 72 hours.  BNP: BNP (last 3 results) Recent Labs    03/07/23 0952  BNP 2,668.0*    ProBNP (last 3 results) No results for input(s): "PROBNP" in the last 8760 hours.   D-Dimer No results for input(s): "DDIMER" in the last 72 hours.  Hemoglobin A1C No results for input(s): "HGBA1C" in the last 72 hours. Fasting Lipid Panel No results for input(s): "CHOL", "HDL", "LDLCALC", "TRIG", "CHOLHDL", "LDLDIRECT" in the last 72 hours. Thyroid Function Tests No results for input(s): "TSH", "T4TOTAL", "T3FREE", "THYROIDAB" in the last 72 hours.  Invalid input(s): "FREET3"   Other results:   Imaging    No results found.   Medications:     Scheduled Medications:  ALPRAZolam  0.5 mg Oral Once   apixaban  10 mg Oral BID   Followed by   Melene Muller ON 03/15/2023] apixaban  5 mg Oral BID   Chlorhexidine Gluconate Cloth  6 each Topical Daily   escitalopram  20 mg Oral Daily   ezetimibe  10 mg Oral Daily   insulin aspart  0-15 Units Subcutaneous TID WC   insulin aspart  0-5 Units Subcutaneous QHS   rosuvastatin  40 mg Oral Daily   sodium chloride flush  10-40 mL Intracatheter Q12H    Infusions:  amiodarone 30 mg/hr (03/11/23 0248)   DOBUTamine 1.5 mcg/kg/min (03/10/23 0907)    PRN Medications: acetaminophen **OR** acetaminophen, ALPRAZolam, calcium carbonate, ondansetron **OR** ondansetron (ZOFRAN) IV, sodium chloride flush    Patient Profile   82 y.o. male with history of NICM/chronic systolic CHF with recovered EF, nonobstructive CAD, hx CVA, HTN, HLD, carotid artery disease. Admitted with acute bilateral PEs and a/c CHF.   Assessment/Plan   Acute on chronic systolic CHF: - Known hx CHF with prior EF  < 20% in 2018. LHC with nonobstructive CAD. - LVEF improved to 60-65% on echo 07/2018 with GDMT - Echo 05/21: EF 60-65% - Echo 03/07/23: EF < 20%, RV moderately reduced, mild to moderate MR, low-flow low-gradient  moderate AS with mean gradient 6 mmhg, AVA 1 cm2, DI 0.26, very low SV index, dilated IVC with estimated RAP 15 - Given conduction issues + low flow AS will need to check for amyloid.  - Myeloma panel+ urine immunofixation pending. Consider cMRI this admit. - Beta blocker stopped d/t concern for low-output - CO-OX 58% on DBA 1.5. Stop DBA. Check CO-Ox in a few hrs.  - Unable to check CVP, RN working on new setup. Does not appear overloaded on exam. Down 11 lb.  Will likely need to add back diuretic within next 1-2 days. - No BP room for GDMT   2. Bilateral PEs: - Suspect d/t low flow in setting of severe biventricular heart failure - Heparin off. Now on Eliquis.   3. Nonobstructive CAD/elevated troponin: - Nonobstructive CAD on LHC in 2018 - Troponin 161>096>045 - Likely demand ischemia in setting of PE and CHF   4. Aortic valve stenosis: - Echo 03/07/23: EF < 20%, low-flow low-gradient  moderate AS with mean gradient 6 mmhg, AVA 1 cm2, DI 0.26, very low SV index, dilated IVC with estimated RAP 15 - Likely limited benefit to TAVR   5. ?Hepatic cirrhosis/liver lesion: - Noted on CTA.  - Abdomen MRI w/ and w/o contrast recommended   6. Cognitive impairment: - Worsening since prior stroke in 2021 - Seems to have significant short-term memory issue. Did not remember me after I spent 30 minutes with him an hour prior.  7.  A Fib RVR - Converted to SR with IV amio. - Switch to PO amiodarone. - On eliquis.   8. GOC -DNR  -Ex-wife, [name], is POA -Worsening decline over last few months in setting of severe biventricular HF. Hopefully will continue to improve with dobutamine. If condition worsens, will need to consider hospice. Options very limited.  -Palliative Care has  been consulted.   Length of Stay: 4  FINCH, LINDSAY N, PA-C  03/11/2023, 8:30 AM  Advanced Heart Failure Team Pager [phone] (M-F; 7a - 5p)  Please contact [facility] for night-coverage after hours (5p -7a ) and weekends on [email or web address]  Patient seen and examined with the above-signed Advanced Practice Provider and/or Housestaff. I personally reviewed laboratory data, imaging studies and relevant notes. I independently examined the patient and formulated the important aspects of the plan. I have edited the note to reflect any of my changes or salient points. I have personally discussed the plan with the patient and/or family.  Remains confused. Denies CP or SOB.  On DBA 1.5. Back in NSR on amio.   General:  Elderly No resp difficulty HEENT: normal Neck: supple. no JVD. Carotids 2+ bilat; no bruits.  Cor: PMI nondisplaced. Regular rate & rhythm. 2/6 AS Lungs: clear Abdomen: soft, nontender, nondistended. No hepatosplenomegaly. No bruits or masses. Good bowel sounds. Extremities: no cyanosis, clubbing, rash, edema Neuro: confused at times , cranial nerves grossly intact. moves all 4 extremities w/o difficulty. Affect pleasant  Volume status looks better. Can stop lasix.   Not candidate for TAVR or advanced therapies with dementia. Will stop DBA. Continue medical therapy.   Recommend Palliative Care.   Arvilla Meres, MD  10:35 AM

## 2023-03-11 NOTE — Progress Notes (Signed)
CVP 10. Start Torsemide 40 mg daily.

## 2023-03-12 ENCOUNTER — Telehealth (HOSPITAL_COMMUNITY): Payer: Self-pay

## 2023-03-12 ENCOUNTER — Other Ambulatory Visit (HOSPITAL_COMMUNITY): Payer: Self-pay

## 2023-03-12 DIAGNOSIS — R531 Weakness: Secondary | ICD-10-CM

## 2023-03-12 DIAGNOSIS — F39 Unspecified mood [affective] disorder: Secondary | ICD-10-CM | POA: Diagnosis not present

## 2023-03-12 DIAGNOSIS — I5043 Acute on chronic combined systolic (congestive) and diastolic (congestive) heart failure: Secondary | ICD-10-CM | POA: Diagnosis not present

## 2023-03-12 DIAGNOSIS — R0602 Shortness of breath: Secondary | ICD-10-CM | POA: Diagnosis not present

## 2023-03-12 DIAGNOSIS — Z515 Encounter for palliative care: Secondary | ICD-10-CM | POA: Diagnosis not present

## 2023-03-12 LAB — COOXEMETRY PANEL
Carboxyhemoglobin: 1.7 % — ABNORMAL HIGH (ref 0.5–1.5)
Methemoglobin: <0.7 % (ref 0.0–1.5)
O2 Saturation: 56.1 %
Total hemoglobin: 13.2 g/dL (ref 12.0–16.0)

## 2023-03-12 LAB — BASIC METABOLIC PANEL
Anion gap: 11 (ref 5–15)
BUN: 17 mg/dL (ref 8–23)
CO2: 32 mmol/L (ref 22–32)
Calcium: 8.4 mg/dL — ABNORMAL LOW (ref 8.9–10.3)
Chloride: 95 mmol/L — ABNORMAL LOW (ref 98–111)
Creatinine, Ser: 1.34 mg/dL — ABNORMAL HIGH (ref 0.61–1.24)
GFR, Estimated: 53 mL/min — ABNORMAL LOW (ref >60)
Glucose, Bld: 96 mg/dL (ref 70–99)
Potassium: 3.7 mmol/L (ref 3.5–5.1)
Sodium: 138 mmol/L (ref 135–145)

## 2023-03-12 LAB — GLUCOSE, CAPILLARY
Glucose-Capillary: 95 mg/dL (ref 70–99)
Glucose-Capillary: 188 mg/dL — ABNORMAL HIGH (ref 70–99)
Glucose-Capillary: 193 mg/dL — ABNORMAL HIGH (ref 70–99)
Glucose-Capillary: 220 mg/dL — ABNORMAL HIGH (ref 70–99)

## 2023-03-12 LAB — CBC
HCT: 40.1 % (ref 39.0–52.0)
Hemoglobin: 12.5 g/dL — ABNORMAL LOW (ref 13.0–17.0)
MCH: 28 pg (ref 26.0–34.0)
MCHC: 31.2 g/dL (ref 30.0–36.0)
MCV: 89.7 fL (ref 80.0–100.0)
Platelets: 182 10*3/uL (ref 150–400)
RBC: 4.47 MIL/uL (ref 4.22–5.81)
RDW: 14.9 % (ref 11.5–15.5)
WBC: 8 10*3/uL (ref 4.0–10.5)
nRBC: 0 % (ref 0.0–0.2)

## 2023-03-12 LAB — MAGNESIUM: Magnesium: 1.7 mg/dL (ref 1.7–2.4)

## 2023-03-12 LAB — IMMUNOFIXATION, URINE

## 2023-03-12 MED ORDER — AMIODARONE HCL 200 MG PO TABS
400.0000 mg | ORAL_TABLET | Freq: Two times a day (BID) | ORAL | Status: DC
  Administered 2023-03-12 – 2023-03-20 (×16): 400 mg via ORAL
  Filled 2023-03-12 (×16): qty 2

## 2023-03-12 MED ORDER — MIRTAZAPINE 7.5 MG PO TABS
15.0000 mg | ORAL_TABLET | Freq: Every day | ORAL | Status: DC
  Administered 2023-03-12 – 2023-03-20 (×9): 15 mg via ORAL
  Filled 2023-03-12 (×9): qty 2

## 2023-03-12 MED ORDER — MAGNESIUM SULFATE 2 GM/50ML IV SOLN
2.0000 g | Freq: Once | INTRAVENOUS | Status: AC
  Administered 2023-03-12: 2 g via INTRAVENOUS
  Filled 2023-03-12: qty 50

## 2023-03-12 MED ORDER — TORSEMIDE 20 MG PO TABS: 20.0000 mg | ORAL_TABLET | Freq: Every day | ORAL | Status: DC

## 2023-03-12 MED ORDER — AMIODARONE HCL 200 MG PO TABS
200.0000 mg | ORAL_TABLET | Freq: Once | ORAL | Status: AC
  Administered 2023-03-12: 200 mg via ORAL
  Filled 2023-03-12: qty 1
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
## 2023-03-12 NOTE — Progress Notes (Signed)
Patient ID: Christian [name], male   DOB: September 07, 1941, 82 y.o.   MRN: [id number]    Progress Note from the Palliative Medicine Team at [facility]   Patient Name: Christian [name]        Date: 03/12/2023 DOB: 1941-04-17  Age: 82 y.o. MRN#: [id number] Attending Physician: Zannie Cove, MD Primary Care Physician: [name], [place] Admit Date: 03/07/2023   Medical records reviewed   82 y.o. male  with past medical history of stroke with mild cognitive impairment, hoh, CHF, HTN, HLD, CAD, DM with neuropathy,  admitted on 03/07/2023 with bilateral PE and heart failure exacerbation. Required dobutamine for diuresis, now stopped. Palliative medicine consulted for GOC.   Hospitalization complicated by patient's verbalized suicidal ideations, Psychiatry on board.  Primary Decision Maker PATIENT - surrogate decision maker is ex-spouse- Christian [name], he has a sister that he has been estranged from for over 30 years, unable to be contacted- no other family- [name] is agreeable to be surrogate if needed   This NP assessed patient at the bedside as a follow up for palliative medicine needs and emotional support. Patient's ex-spouse and current  main support person/Christian [name]  at bedside.   Patient is alert and oriented.  In conversation with psychiatry via secure chat they have cleared him for discharge  Continued conversation regarding patient current medical and psychiatric conditions.  Both patient and Christian verbalize their desire that once medically stable for discharge, to pursue short-term rehab at [facility].  I did verbalize my concern that patient is high risk for decompensation secondary to multiple co-morbidities.   Education offered on hospice benefit; philosophy and eligibility   Education offered today regarding  the importance of continued conversation with family and their  medical providers regarding overall plan of care and treatment options,  ensuring decisions are within the context of  the patients values and GOCs.  Questions and concerns addressed   Discussed with Dr Cyndie Chime    Time: 50  minutes  Detailed review of medical records ( labs, imaging, vital signs), medically appropriate exam ( MS, skin, resp)   discussed with treatment team, counseling and education to patient, family, staff, documenting clinical information, medication management, coordination of care    [name] NP  Palliative Medicine Team Team Phone # [phone] Pager [phone]

## 2023-03-12 NOTE — Progress Notes (Signed)
Mobility Specialist Progress Note:    03/12/23 1656  Mobility  Activity Refused mobility   Pt refused mobility d/t him receiving and eating his dinner. Will f/u as able.    Thompson Grayer Mobility Specialist  Please contact vis Secure Chat or  Rehab Office [phone]

## 2023-03-12 NOTE — Plan of Care (Signed)
  Problem: Skin Integrity: Goal: Risk for impaired skin integrity will decrease Outcome: Progressing   Problem: Clinical Measurements: Goal: Will remain free from infection Outcome: Progressing   Problem: Activity: Goal: Risk for activity intolerance will decrease Outcome: Progressing   Problem: Safety: Goal: Ability to remain free from injury will improve Outcome: Progressing

## 2023-03-12 NOTE — Progress Notes (Addendum)
Advanced Heart Failure Rounding Note  PCP-Cardiologist: Kristeen Miss, MD   Subjective:   5/20 Started on DBA and diuresed with IV lasix. Lactic acid 1.8.  5/21 DBA turned down to 1.5 5/22 DBA off. Started po Torsemide.  Now 1:1 d/t suicidal ideation. Psychiatry following.  Off DBA. No CO-OX this am.  CVP 4. On Torsemide 40 mg daily.  Scr 1.19>>1.34.  In and out of ? Atrial flutter vs atrial tach overnight. Currently sinus tach with PACs.   Objective:   Weight Range: 68.4 kg Body mass index is 24.34 kg/m.   Vital Signs:   Temp:  [97.5 F (36.4 C)-98.4 F (36.9 C)] 98.4 F (36.9 C) (05/23 0712) Pulse Rate:  [82-116] 116 (05/23 0712) Resp:  [11-22] 20 (05/23 0712) BP: (91-106)/(62-79) 91/66 (05/23 0712) SpO2:  [91 %-99 %] 95 % (05/23 0712) Weight:  [68.4 kg] 68.4 kg (05/23 0609) Last BM Date : 03/10/23  Weight change: Filed Weights   03/10/23 0434 03/11/23 0657 03/12/23 0609  Weight: 67.2 kg 67.7 kg 68.4 kg    Intake/Output:   Intake/Output Summary (Last 24 hours) at 03/12/2023 0902 Last data filed at 03/12/2023 0800 Gross per 24 hour  Intake 443.57 ml  Output 2900 ml  Net -2456.43 ml     Physical Exam    General:  Chronically ill appearing elderly male HEENT: normal Neck: supple. no JVD. Carotids 2+ bilat; no bruits.  Cor: PMI nondisplaced. Regular rate & rhythm, tachy. No rubs, gallops or murmurs. Lungs: clear Abdomen: soft, nontender, nondistended.  Extremities: no cyanosis, clubbing, rash, edema Neuro: alert & orientedx3. Affect flat.    Telemetry  ? Runs Atrial fib vs Atrial tach overnight, currently SR/ST 90s-100s PACs   EKG    N/A   Labs    CBC Recent Labs    03/11/23 0352 03/12/23 0612  WBC 6.7 8.0  HGB 11.5* 12.5*  HCT 36.7* 40.1  MCV 91.3 89.7  PLT 162 182   Basic Metabolic Panel Recent Labs    47/82/95 0352 03/12/23 0612  NA 132* 138  K 3.9 3.7  CL 93* 95*  CO2 29 32  GLUCOSE 258* 96  BUN 20 17  CREATININE  1.19 1.34*  CALCIUM 8.1* 8.4*  MG 1.8 1.7   Liver Function Tests No results for input(s): "AST", "ALT", "ALKPHOS", "BILITOT", "PROT", "ALBUMIN" in the last 72 hours.  No results for input(s): "LIPASE", "AMYLASE" in the last 72 hours. Cardiac Enzymes No results for input(s): "CKTOTAL", "CKMB", "CKMBINDEX", "TROPONINI" in the last 72 hours.  BNP: BNP (last 3 results) Recent Labs    03/07/23 0952  BNP 2,668.0*    ProBNP (last 3 results) No results for input(s): "PROBNP" in the last 8760 hours.   D-Dimer No results for input(s): "DDIMER" in the last 72 hours.  Hemoglobin A1C No results for input(s): "HGBA1C" in the last 72 hours. Fasting Lipid Panel No results for input(s): "CHOL", "HDL", "LDLCALC", "TRIG", "CHOLHDL", "LDLDIRECT" in the last 72 hours. Thyroid Function Tests No results for input(s): "TSH", "T4TOTAL", "T3FREE", "THYROIDAB" in the last 72 hours.  Invalid input(s): "FREET3"   Other results:   Imaging    No results found.   Medications:     Scheduled Medications:  ALPRAZolam  0.5 mg Oral Once   amiodarone  200 mg Oral BID   apixaban  10 mg Oral BID   Followed by   Melene Muller ON 03/15/2023] apixaban  5 mg Oral BID   Chlorhexidine Gluconate Cloth  6  each Topical Daily   escitalopram  20 mg Oral Daily   ezetimibe  10 mg Oral Daily   insulin aspart  0-15 Units Subcutaneous TID WC   insulin aspart  0-5 Units Subcutaneous QHS   mirtazapine  7.5 mg Oral QHS   potassium chloride  40 mEq Oral Daily   rosuvastatin  40 mg Oral Daily   sodium chloride flush  10-40 mL Intracatheter Q12H   torsemide  40 mg Oral Daily    Infusions:    PRN Medications: acetaminophen **OR** acetaminophen, ALPRAZolam, calcium carbonate, morphine CONCENTRATE, ondansetron **OR** ondansetron (ZOFRAN) IV, sodium chloride flush    Patient Profile   82 y.o. male with history of NICM/chronic systolic CHF with recovered EF, nonobstructive CAD, hx CVA, HTN, HLD, carotid artery  disease. Admitted with acute bilateral PEs and a/c CHF.   Assessment/Plan   Acute on chronic systolic CHF: - Known hx CHF with prior EF < 20% in 2018. LHC with nonobstructive CAD. 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 Myeloma panel & urine immunofixation pending.  - Not a candidate for TAVR or advanced therapies with underlying dementia. Palliative care has been consulted. - Beta blocker stopped d/t concern for low-output - Off DBA. No CO-OX this am, check STAT - CVP 4. Scr up slightly. Decrease torsemide from 40 mg to 20 mg daily. - No BP room for GDMT   2. Bilateral PEs: - Suspect d/t low flow in setting of severe biventricular heart failure - Heparin off. Now on Eliquis.   3. Nonobstructive CAD/elevated troponin: - Nonobstructive CAD on LHC in 2018 - Troponin 960>454>098 - Likely demand ischemia in setting of PE and CHF   4. Aortic valve stenosis: - Echo 03/07/23: EF < 20%, low-flow low-gradient  moderate AS with mean gradient 6 mmhg, AVA 1 cm2, DI 0.26, very low SV index, dilated IVC with estimated RAP 15 - Likely limited benefit to TAVR   5. ?Hepatic cirrhosis/liver lesion: - Noted on CTA.  - Abdomen MRI w/ and w/o contrast recommended   6. Cognitive impairment: - Worsening since prior stroke in 2021 - Seems to have significant short-term memory issue. Has not remembered me from day to day.  7.  A Fib RVR - Converted to SR with IV amio. - ? Runs atrial flutter vs Atrial tach overnight. Currently SR with PACs. Increase po amiodarone to 400 mg BID - On eliquis.   8. GOC: -DNR  -Ex-wife, [name], is POA -Has limited options. Not a candidate for TAVR or advanced  therapies d/t dementia -Palliative Care has been consulted.  9. Suicidal ideation: - Psychiatry following   Length of Stay: 5  FINCH, LINDSAY N, PA-C  03/12/2023, 9:02 AM  Advanced Heart Failure Team Pager [phone] (M-F; 7a - 5p)  Please contact [facility] for night-coverage after hours (5p -7a ) and weekends on [email or web address]   Patient seen and examined with the above-signed Advanced Practice Provider and/or Housestaff. I personally reviewed laboratory data, imaging studies and relevant notes. I independently examined the patient and formulated the important aspects of the plan. I have edited the note to reflect any of my changes or salient points. I have  personally discussed the plan with the patient and/or family.  On 1:1 supervision for suicide watch. Denies CP or SOB. Had bursts of AF/AFL overnight.   General:  Elderly. No resp difficulty HEENT: normal Neck: supple. no JVD. Carotids 2+ bilat; no bruits. No lymphadenopathy or thryomegaly appreciated. Cor: PMI nondisplaced. Regular rate & rhythm. 2/6 AS Lungs: clear Abdomen: soft, nontender, nondistended. No hepatosplenomegaly. No bruits or masses. Good bowel sounds. Extremities: no cyanosis, clubbing, rash, edema Neuro: awake following commands   Overall stable from HF perspective. He is not candidate for any invasive procedures or advanced therapies. Would continue current meds for now. Titrate GDMT as tolerated.  Agree with further Hospice discussions as appropriate once depression addressed. That said, his depression is likely related in part to his severe health ailments and poor QoL which are unlikely to change much despite treatment for his heart disease.   Arvilla Meres, MD  11:26 AM

## 2023-03-12 NOTE — Progress Notes (Signed)
Occupational Therapy Treatment Patient Details Name: Christian [name] MRN: [id number] DOB: 1941-06-04 Today's Date: 03/12/2023   History of present illness 82 y.o. male adm 5/18 with SOB x1 month. Pt with multiple bil segmental PE, started on heparin and CHF exacerbation. PMHx: R PCA CVA, HFrEF, CAD, HTN, GERD, Hernia of abdominal cavity, HLD, Neuropathy, DM II.   OT comments  Pt somewhat weaker today. Needed moderate assistance for bed mobility, mod assist for donning front opening gown and total assist for socks. Stood with min assist and ambulated with rollator with min assist and multimodal cues for safety and brake use. Pt with varying O2 from 75% to 100% on RA, replaced 3L O2. Pt cooperative and pleasant. Continues to be appropriate for post acute rehab.    Recommendations for follow up therapy are one component of a multi-disciplinary discharge planning process, led by the attending physician.  Recommendations may be updated based on patient status, additional functional criteria and insurance authorization.    Assistance Recommended at Discharge Frequent or constant Supervision/Assistance  Patient can return home with the following  A little help with walking and/or transfers;A little help with bathing/dressing/bathroom;Assistance with cooking/housework;Direct supervision/assist for medications management;Direct supervision/assist for financial management;Assist for transportation;Help with stairs or ramp for entrance   Equipment Recommendations  Other (comment) (defer to next venue)    Recommendations for Other Services      Precautions / Restrictions Precautions Precautions: Fall Precaution Comments: watch SpO2       Mobility Bed Mobility Overal bed mobility: Needs Assistance Bed Mobility: Supine to Sit     Supine to sit: Mod assist     General bed mobility comments: assist for LEs over EOB and to raise trunk, increased time for hips to EOB    Transfers Overall  transfer level: Needs assistance Equipment used: Rolling walker (2 wheels) Transfers: Sit to/from Stand Sit to Stand: Min assist           General transfer comment: cues for hand placement, assist to rise and steady and to manage brakes on rollator     Balance Overall balance assessment: Needs assistance   Sitting balance-Leahy Scale: Fair Sitting balance - Comments: posterior bias, but no LOB   Standing balance support: Bilateral upper extremity supported Standing balance-Leahy Scale: Poor Standing balance comment: RW for gait                           ADL either performed or assessed with clinical judgement   ADL Overall ADL's : Needs assistance/impaired Eating/Feeding: Set up;Sitting   Grooming: Wash/dry hands;Wash/dry face;Sitting;Set up           Upper Body Dressing : Moderate assistance;Sitting   Lower Body Dressing: Total assistance;Sitting/lateral leans               Functional mobility during ADLs: Minimal assistance;Rolling walker (2 wheels);+2 for safety/equipment      Extremity/Trunk Assessment              Vision       Perception     Praxis      Cognition Arousal/Alertness: Awake/alert Behavior During Therapy: WFL for tasks assessed/performed Overall Cognitive Status: Impaired/Different from baseline Area of Impairment: Orientation, Memory, Following commands, Problem solving, Safety/judgement                 Orientation Level: Time, Situation   Memory: Decreased short-term memory   Safety/Judgement: Decreased awareness of safety, Decreased awareness of deficits Awareness: Intellectual  Problem Solving: Slow processing, Decreased initiation, Difficulty sequencing, Requires verbal cues General Comments: multimodal cues to follow commands        Exercises      Shoulder Instructions       General Comments      Pertinent Vitals/ Pain       Pain Assessment Pain Assessment: Faces Faces Pain Scale: No  hurt  Home Living                                          Prior Functioning/Environment              Frequency  Min 1X/week        Progress Toward Goals  OT Goals(current goals can now be found in the care plan section)  Progress towards OT goals: Progressing toward goals  Acute Rehab OT Goals OT Goal Formulation: With patient Time For Goal Achievement: 03/23/23 Potential to Achieve Goals: Good  Plan Discharge plan remains appropriate    Co-evaluation                 AM-PAC OT "6 Clicks" Daily Activity     Outcome Measure   Help from another person eating meals?: A Little Help from another person taking care of personal grooming?: A Little Help from another person toileting, which includes using toliet, bedpan, or urinal?: A Lot Help from another person bathing (including washing, rinsing, drying)?: A Lot Help from another person to put on and taking off regular upper body clothing?: A Lot Help from another person to put on and taking off regular lower body clothing?: Total 6 Click Score: 13    End of Session Equipment Utilized During Treatment: Gait belt;Rolling walker (2 wheels)  OT Visit Diagnosis: Unsteadiness on feet (R26.81);Other abnormalities of gait and mobility (R26.89);Muscle weakness (generalized) (M62.81)   Activity Tolerance Patient tolerated treatment well   Patient Left in chair;with call bell/phone within reach;with nursing/sitter in room   Nurse Communication Other (comment) (ok to have pudding)        Time: 1610-9604 OT Time Calculation (min): 30 min  Charges: OT General Charges $OT Visit: 1 Visit OT Treatments $Self Care/Home Management : 8-22 mins $Therapeutic Activity: 8-22 mins  Berna Spare, OTR/L Acute Rehabilitation Services Office: [phone]   Evern Bio 03/12/2023, 3:14 PM

## 2023-03-12 NOTE — Telephone Encounter (Signed)
Patient Advocate Encounter  Application for Eliquis faxed to [employer] on 03/12/23. Application form attached to patient chart.

## 2023-03-12 NOTE — Progress Notes (Signed)
PROGRESS NOTE    Christian [name]  UJW:[id number] DOB: [birth year] DOA: 03/07/2023 PCP: [name], PA  [name] with history of chronic systolic CHF with recovered EF, NICM, CVA, hypertension, type 2 diabetes mellitus,?  Early dementia presented to the ED with dyspnea on exertion X several months worsened over 2 days. -Workup in the ED noted bilateral pulmonary emboli and CHF -Admitted, 5/18 started on IV heparin -5/19, diuretics started, echo noted EF down to<20% -5/20 advanced heart failure team consulted, transferred to SDU for dobutamine -5/21 tachyarrhythmia started on amiodarone palliative care consulted -5/22, reported suicidal ideation, psych consulted, dobutamine discontinued  Subjective: -No events overnight, overall stable  Assessment and Plan:  Acute on chronic combined systolic and diastolic CHF - -EF previously <56% in 2018, improved to 60-65% in 07/2018  -Repeat echo this admission with <20%, moderately reduced RV, moderate aortic stenosis  -LHC 2018 with nonobstructive CAD -sp inotropic support with dobutamine, discontinued 5/22 -GDMT limited by hypotension  -Overall prognosis is felt to be poor and not a candidate for advanced therapies/TAVR, palliative care following -Myeloma panel, immunofixation pending -Now on oral torsemide,  Avoid SGLT2i with urinary retention/Foley  Acute pulmonary embolism without acute cor pulmonale CTA chest on admission showed bilateral PE. - Continue new Eliquis  Major depression, suicidal ideation -Psych consulted, also has cognitive deficits which interfere with evaluation and care  Goals of care: Frail elderly male with severe cardiomyopathy and cognitive deficits with ongoing FTT, palliative consulted  Paroxysmal atrial fibrillation (HCC) This is new onset.  TSH normal. Echo without valvular disease. CHA2DS2-Vasc 7  -Was on IV amiodarone, now converted to sinus rhythm, changed to oral amiodarone, continue Eliquis  Acute urinary  retention Overnight 5/20, requiring repeated I/O cath, >1L return both times. -Foley catheter placed 5/21  Liver lesion CTA on admission showed radiographic indications of cirrhosis and known right heart failure as well as 2.3 cm indeterminate lesion. - Obtain MRI abdomen pending goals of care and outcome of CHF workup  Mood disorder (HCC) - Continue Lexapro  Cerebrovascular disease See above  Benign essential HTN Blood pressure low - Antihypertensives/guideline directed CHF therapy per cardiology  CAD PTA Aspirin and Plavix stopped here.  Now on apixaban - Continue Zetia  Type 2 diabetes mellitus with complication, without long-term current use of insulin (HCC) A1c 7.6% last Jan -stable, SSI - Hold metformin  Hyperlipidemia - Continue Zetia  DVT prophylaxis: Apixaban Code Status: DNR Family Communication: None present Disposition Plan: To be determined, PT recommended home health and OT recommended SNF  Consultants: Heart failure team, palliative care, psych   Procedures:   Antimicrobials:    Objective: Vitals:   03/12/23 0712 03/12/23 0714 03/12/23 0917 03/12/23 1133  BP: 91/66   98/77  Pulse: (!) 116 92 95 94  Resp: 20 16 17 18   Temp: 98.4 F (36.9 C)   97.7 F (36.5 C)  TempSrc: Oral   Oral  SpO2: 95% 95% 93% 100%  Weight:      Height:        Intake/Output Summary (Last 24 hours) at 03/12/2023 1231 Last data filed at 03/12/2023 1137 Gross per 24 hour  Intake 443.57 ml  Output 3050 ml  Net -2606.43 ml   Filed Weights   03/10/23 0434 03/11/23 0657 03/12/23 0609  Weight: 67.2 kg 67.7 kg 68.4 kg    Examination:  General exam: Chronically ill elderly male lying in bed, hard of hearing, mild cognitive deficits CVS: S1-S2, regular rhythm Lungs: Decreased at the bases otherwise  clear Abdomen: Soft, nontender, bowel sounds present Extremities: No edema  Psychiatry: Poor insight and judgment, cognitive deficits    Data Reviewed:    CBC: Recent Labs  Lab 03/07/23 0952 03/08/23 0231 03/09/23 0334 03/10/23 0420 03/11/23 0352 03/12/23 0612  WBC 9.1 6.6 6.0 6.2 6.7 8.0  NEUTROABS 6.1  --   --   --   --   --   HGB 12.8* 11.9* 11.6* 12.8* 11.5* 12.5*  HCT 40.9 38.3* 37.5* 40.9 36.7* 40.1  MCV 93.2 91.8 92.4 90.5 91.3 89.7  PLT 217 184 172 176 162 182   Basic Metabolic Panel: Recent Labs  Lab 03/07/23 0952 03/08/23 0231 03/09/23 0334 03/10/23 0420 03/11/23 0352 03/12/23 0612  NA 138 136 138 140 132* 138  K 3.7 3.4* 3.6 3.5 3.9 3.7  CL 102 102 103 98 93* 95*  CO2 25 22 25 29 29  32  GLUCOSE 139* 90 118* 122* 258* 96  BUN 26* 19 20 15 20 17   CREATININE 1.21 1.09 1.18 1.13 1.19 1.34*  CALCIUM 8.3* 8.2* 8.1* 8.6* 8.1* 8.4*  MG 1.9  --   --   --  1.8 1.7   GFR: Estimated Creatinine Clearance: 38.4 mL/min (A) (by C-G formula based on SCr of 1.34 mg/dL (H)). Liver Function Tests: Recent Labs  Lab 03/08/23 0231  AST 20  ALT 16  ALKPHOS 41  BILITOT 1.6*  PROT 5.2*  ALBUMIN 3.1*   No results for input(s): "LIPASE", "AMYLASE" in the last 168 hours. No results for input(s): "AMMONIA" in the last 168 hours. Coagulation Profile: No results for input(s): "INR", "PROTIME" in the last 168 hours. Cardiac Enzymes: No results for input(s): "CKTOTAL", "CKMB", "CKMBINDEX", "TROPONINI" in the last 168 hours. BNP (last 3 results) No results for input(s): "PROBNP" in the last 8760 hours. HbA1C: No results for input(s): "HGBA1C" in the last 72 hours. CBG: Recent Labs  Lab 03/11/23 1143 03/11/23 1616 03/11/23 2147 03/12/23 0607 03/12/23 1122  GLUCAP 136* 144* 157* 95 188*   Lipid Profile: No results for input(s): "CHOL", "HDL", "LDLCALC", "TRIG", "CHOLHDL", "LDLDIRECT" in the last 72 hours. Thyroid Function Tests: No results for input(s): "TSH", "T4TOTAL", "FREET4", "T3FREE", "THYROIDAB" in the last 72 hours. Anemia Panel: No results for input(s): "VITAMINB12", "FOLATE", "FERRITIN", "TIBC", "IRON",  "RETICCTPCT" in the last 72 hours. Urine analysis:    Component Value Date/Time   COLORURINE YELLOW 01/06/2018 1602   APPEARANCEUR Clear 04/07/2018 1459   LABSPEC 1.020 01/06/2018 1602   PHURINE 6.5 01/06/2018 1602   GLUCOSEU Negative 04/07/2018 1459   GLUCOSEU NEGATIVE 01/06/2018 1602   HGBUR NEGATIVE 01/06/2018 1602   BILIRUBINUR 1 01/22/2023 1143   BILIRUBINUR Negative 04/07/2018 1459   KETONESUR negative 10/06/2018 1634   KETONESUR TRACE (A) 01/06/2018 1602   PROTEINUR Positive (A) 01/22/2023 1143   PROTEINUR Negative 04/07/2018 1459   UROBILINOGEN 1.0 01/22/2023 1143   UROBILINOGEN 0.2 01/06/2018 1602   NITRITE Negative 01/22/2023 1143   NITRITE Negative 04/07/2018 1459   NITRITE NEGATIVE 01/06/2018 1602   LEUKOCYTESUR Negative 01/22/2023 1143   LEUKOCYTESUR Negative 04/07/2018 1459   Sepsis Labs: @LABRCNTIP (procalcitonin:4,lacticidven:4)  ) Recent Results (from the past 240 hour(s))  SARS Coronavirus 2 by RT PCR (hospital order, performed in [facility] hospital lab) *cepheid single result test* Anterior Nasal Swab     Status: None   Collection Time: 03/07/23  9:46 AM   Specimen: Anterior Nasal Swab  Result Value Ref Range Status   SARS Coronavirus 2 by RT PCR NEGATIVE NEGATIVE  Final    Comment: Performed at [facility], [address], [place], [place] [address]     Radiology Studies: No results found.   Scheduled Meds:  ALPRAZolam  0.5 mg Oral Once   amiodarone  400 mg Oral BID   apixaban  10 mg Oral BID   Followed by   Melene Muller ON 03/15/2023] apixaban  5 mg Oral BID   Chlorhexidine Gluconate Cloth  6 each Topical Daily   escitalopram  20 mg Oral Daily   ezetimibe  10 mg Oral Daily   insulin aspart  0-15 Units Subcutaneous TID WC   insulin aspart  0-5 Units Subcutaneous QHS   mirtazapine  7.5 mg Oral QHS   potassium chloride  40 mEq Oral Daily   rosuvastatin  40 mg Oral Daily   sodium chloride flush  10-40 mL Intracatheter Q12H   [START ON  03/13/2023] torsemide  20 mg Oral Daily   Continuous Infusions:   LOS: 5 days    Time spent:    Zannie Cove, MD [facility]   03/12/2023, 12:31 PM

## 2023-03-13 DIAGNOSIS — F39 Unspecified mood [affective] disorder: Secondary | ICD-10-CM | POA: Diagnosis not present

## 2023-03-13 DIAGNOSIS — I5043 Acute on chronic combined systolic (congestive) and diastolic (congestive) heart failure: Secondary | ICD-10-CM | POA: Diagnosis not present

## 2023-03-13 LAB — GLUCOSE, CAPILLARY
Glucose-Capillary: 121 mg/dL — ABNORMAL HIGH (ref 70–99)
Glucose-Capillary: 131 mg/dL — ABNORMAL HIGH (ref 70–99)
Glucose-Capillary: 142 mg/dL — ABNORMAL HIGH (ref 70–99)
Glucose-Capillary: 180 mg/dL — ABNORMAL HIGH (ref 70–99)

## 2023-03-13 LAB — CBC
HCT: 40.3 % (ref 39.0–52.0)
Hemoglobin: 12.5 g/dL — ABNORMAL LOW (ref 13.0–17.0)
MCH: 28.3 pg (ref 26.0–34.0)
MCHC: 31 g/dL (ref 30.0–36.0)
MCV: 91.2 fL (ref 80.0–100.0)
Platelets: 172 10*3/uL (ref 150–400)
RBC: 4.42 MIL/uL (ref 4.22–5.81)
RDW: 14.8 % (ref 11.5–15.5)
WBC: 7.4 10*3/uL (ref 4.0–10.5)
nRBC: 0 % (ref 0.0–0.2)

## 2023-03-13 LAB — BASIC METABOLIC PANEL
Anion gap: 11 (ref 5–15)
BUN: 18 mg/dL (ref 8–23)
CO2: 34 mmol/L — ABNORMAL HIGH (ref 22–32)
Calcium: 8 mg/dL — ABNORMAL LOW (ref 8.9–10.3)
Chloride: 92 mmol/L — ABNORMAL LOW (ref 98–111)
Creatinine, Ser: 1.34 mg/dL — ABNORMAL HIGH (ref 0.61–1.24)
GFR, Estimated: 53 mL/min — ABNORMAL LOW (ref >60)
Glucose, Bld: 123 mg/dL — ABNORMAL HIGH (ref 70–99)
Potassium: 3.7 mmol/L (ref 3.5–5.1)
Sodium: 137 mmol/L (ref 135–145)

## 2023-03-13 LAB — MAGNESIUM: Magnesium: 2.1 mg/dL (ref 1.7–2.4)

## 2023-03-13 LAB — COOXEMETRY PANEL
Carboxyhemoglobin: 2 % — ABNORMAL HIGH (ref 0.5–1.5)
Methemoglobin: <0.7 % (ref 0.0–1.5)
O2 Saturation: 66.5 %
Total hemoglobin: 12.8 g/dL (ref 12.0–16.0)

## 2023-03-13 MED ORDER — ORAL CARE MOUTH RINSE: 15.0000 mL | OROMUCOSAL | Status: DC | PRN

## 2023-03-13 MED ORDER — FUROSEMIDE 40 MG PO TABS: 40.0000 mg | ORAL_TABLET | Freq: Every day | ORAL | Status: DC

## 2023-03-13 MED ORDER — FUROSEMIDE 40 MG PO TABS
40.0000 mg | ORAL_TABLET | Freq: Every day | ORAL | Status: DC
  Administered 2023-03-14 – 2023-03-20 (×7): 40 mg via ORAL
  Filled 2023-03-13 (×7): qty 1

## 2023-03-13 NOTE — Progress Notes (Signed)
PT Cancellation Note  Patient Details Name: Christian [name] MRN: [id number] DOB: Dec 08, 1940   Cancelled Treatment:    Reason Eval/Treat Not Completed: Patient declined, no reason specified (pt fatigued and stated need to sleep, pt not agreeable to any therapy at this time and per sitter wouldn't eat either)   Ranesha Val B Takoya Jonas 03/13/2023, 9:58 AM [name], PT Acute Rehabilitation Services Office: [phone]

## 2023-03-13 NOTE — Progress Notes (Signed)
PROGRESS NOTE    Christian [name]  [id number]:[id number] DOB: 02-29-1941 DOA: 03/07/2023 PCP: [name], PA  [name] with history of chronic systolic CHF with recovered EF, NICM, CVA, hypertension, type 2 diabetes mellitus,?  Early dementia presented to the ED with dyspnea on exertion X several months worsened over 2 days. -Workup in the ED noted bilateral pulmonary emboli and CHF -Admitted, 5/18 started on IV heparin -5/19, diuretics started, echo noted EF down to<20% -5/20 advanced heart failure team consulted, transferred to SDU for dobutamine -5/21 tachyarrhythmia started on amiodarone palliative care consulted -5/22, reported suicidal ideation, psych consulted, dobutamine discontinued -5/23, palliative meeting again, patient and ex-wife decided to pursue SNF for short-term rehab with palliative care  Subjective: -No events overnight, overall stable  Assessment and Plan:  Acute on chronic combined systolic and diastolic CHF - -EF previously <30% in 2018, improved to 60-65% in 07/2018  -Repeat echo this admission with <20%, moderately reduced RV, moderate aortic stenosis  -LHC 2018 with nonobstructive CAD -sp inotropic support with dobutamine, discontinued 5/22 -GDMT limited by hypotension  -Overall prognosis is felt to be poor and not a candidate for advanced therapies/TAVR, palliative care following -BP remains low, GDMT limited, avoid SGLT2i with urinary retention/Foley -Discharge planning, now plan for SNF for short-term rehab with palliative care follow-up, anticipate need for hospice in the near future  Acute pulmonary embolism without acute cor pulmonale CTA chest on admission showed bilateral PE. - Continue Eliquis  Major depression, suicidal ideation -Psych consulted, also has cognitive deficits which interfere with evaluation and care  Goals of care: Frail elderly male with severe cardiomyopathy and cognitive deficits with ongoing FTT, palliative consulted, now DNR,  conservative management, continue current care  Paroxysmal atrial fibrillation (HCC) -new onset.  TSH normal. Echo without valvular disease. CHA2DS2-Vasc 7  -Was on IV amiodarone, now converted to sinus rhythm, changed to oral amiodarone, continue Eliquis  Acute urinary retention Overnight 5/20, requiring repeated I/O cath, >1L return both times. -Foley catheter placed 5/21, will DC to SNF with Foley, will need voiding trial once more ambulatory  Liver lesion, liver cirrhosis CTA on admission showed radiographic indications of cirrhosis and known right heart failure as well as 2.3 cm indeterminate lesion. - Consider MRI abdomen if he improves functionally down the road, more than likely anticipate need for hospice  Mood disorder (HCC) - Continue Lexapro  Cerebrovascular disease See above  Benign essential HTN Blood pressure low - Meds as above  CAD PTA Aspirin and Plavix stopped here.  Now on apixaban - Continue Zetia  Type 2 diabetes mellitus with complication, without long-term current use of insulin (HCC) A1c 7.6% last Jan -stable, SSI - Hold metformin  Hyperlipidemia - Continue Zetia  DVT prophylaxis: Apixaban Code Status: DNR Family Communication: None present, will update ex-wife Disposition Plan: SNF with palliative care  Consultants: Heart failure team, palliative care, psych   Procedures:   Antimicrobials:    Objective: Vitals:   03/13/23 0330 03/13/23 0635 03/13/23 0801 03/13/23 1146  BP: (!) 87/58  (!) 85/60 99/74  Pulse: 82  78 87  Resp: (!) 21  12 19   Temp: 97.8 F (36.6 C)  (!) 97.4 F (36.3 C) 97.6 F (36.4 C)  TempSrc: Oral  Axillary Oral  SpO2: 98% 97%  91%  Weight:      Height:        Intake/Output Summary (Last 24 hours) at 03/13/2023 1223 Last data filed at 03/12/2023 1901 Gross per 24 hour  Intake 660 ml  Output 400 ml  Net 260 ml   Filed Weights   03/11/23 0657 03/12/23 0609 03/13/23 0215  Weight: 67.7 kg 68.4 kg 107.1 kg     Examination:  General exam: Chronically ill elderly male laying in bed, hard of hearing, mild cognitive deficits CVS: S1-S2, regular rhythm Lungs: Clear bilaterally Abdomen: Soft, nontender, bowel sounds present Extremities: No edema, right arm PICC line noted  Psychiatry: Poor insight and judgment, cognitive deficits    Data Reviewed:   CBC: Recent Labs  Lab 03/07/23 0952 03/08/23 0231 03/09/23 0334 03/10/23 0420 03/11/23 0352 03/12/23 0612 03/13/23 0636  WBC 9.1   < > 6.0 6.2 6.7 8.0 7.4  NEUTROABS 6.1  --   --   --   --   --   --   HGB 12.8*   < > 11.6* 12.8* 11.5* 12.5* 12.5*  HCT 40.9   < > 37.5* 40.9 36.7* 40.1 40.3  MCV 93.2   < > 92.4 90.5 91.3 89.7 91.2  PLT 217   < > 172 176 162 182 172   < > = values in this interval not displayed.   Basic Metabolic Panel: Recent Labs  Lab 03/07/23 0952 03/08/23 0231 03/09/23 0334 03/10/23 0420 03/11/23 0352 03/12/23 0612 03/13/23 0636  NA 138   < > 138 140 132* 138 137  K 3.7   < > 3.6 3.5 3.9 3.7 3.7  CL 102   < > 103 98 93* 95* 92*  CO2 25   < > 25 29 29  32 34*  GLUCOSE 139*   < > 118* 122* 258* 96 123*  BUN 26*   < > 20 15 20 17 18   CREATININE 1.21   < > 1.18 1.13 1.19 1.34* 1.34*  CALCIUM 8.3*   < > 8.1* 8.6* 8.1* 8.4* 8.0*  MG 1.9  --   --   --  1.8 1.7 2.1   < > = values in this interval not displayed.   GFR: Estimated Creatinine Clearance: 48.8 mL/min (A) (by C-G formula based on SCr of 1.34 mg/dL (H)). Liver Function Tests: Recent Labs  Lab 03/08/23 0231  AST 20  ALT 16  ALKPHOS 41  BILITOT 1.6*  PROT 5.2*  ALBUMIN 3.1*   No results for input(s): "LIPASE", "AMYLASE" in the last 168 hours. No results for input(s): "AMMONIA" in the last 168 hours. Coagulation Profile: No results for input(s): "INR", "PROTIME" in the last 168 hours. Cardiac Enzymes: No results for input(s): "CKTOTAL", "CKMB", "CKMBINDEX", "TROPONINI" in the last 168 hours. BNP (last 3 results) No results for input(s):  "PROBNP" in the last 8760 hours. HbA1C: No results for input(s): "HGBA1C" in the last 72 hours. CBG: Recent Labs  Lab 03/12/23 1122 03/12/23 1542 03/12/23 2126 03/13/23 0620 03/13/23 1144  GLUCAP 188* 193* 220* 121* 131*   Lipid Profile: No results for input(s): "CHOL", "HDL", "LDLCALC", "TRIG", "CHOLHDL", "LDLDIRECT" in the last 72 hours. Thyroid Function Tests: No results for input(s): "TSH", "T4TOTAL", "FREET4", "T3FREE", "THYROIDAB" in the last 72 hours. Anemia Panel: No results for input(s): "VITAMINB12", "FOLATE", "FERRITIN", "TIBC", "IRON", "RETICCTPCT" in the last 72 hours. Urine analysis:    Component Value Date/Time   COLORURINE YELLOW 01/06/2018 1602   APPEARANCEUR Clear 04/07/2018 1459   LABSPEC 1.020 01/06/2018 1602   PHURINE 6.5 01/06/2018 1602   GLUCOSEU Negative 04/07/2018 1459   GLUCOSEU NEGATIVE 01/06/2018 1602   HGBUR NEGATIVE 01/06/2018 1602   BILIRUBINUR 1 01/22/2023 1143   BILIRUBINUR Negative 04/07/2018 1459  KETONESUR negative 10/06/2018 1634   KETONESUR TRACE (A) 01/06/2018 1602   PROTEINUR Positive (A) 01/22/2023 1143   PROTEINUR Negative 04/07/2018 1459   UROBILINOGEN 1.0 01/22/2023 1143   UROBILINOGEN 0.2 01/06/2018 1602   NITRITE Negative 01/22/2023 1143   NITRITE Negative 04/07/2018 1459   NITRITE NEGATIVE 01/06/2018 1602   LEUKOCYTESUR Negative 01/22/2023 1143   LEUKOCYTESUR Negative 04/07/2018 1459   Sepsis Labs: @LABRCNTIP (procalcitonin:4,lacticidven:4)  ) Recent Results (from the past 240 hour(s))  SARS Coronavirus 2 by RT PCR (hospital order, performed in [facility] hospital lab) *cepheid single result test* Anterior Nasal Swab     Status: None   Collection Time: 03/07/23  9:46 AM   Specimen: Anterior Nasal Swab  Result Value Ref Range Status   SARS Coronavirus 2 by RT PCR NEGATIVE NEGATIVE Final    Comment: Performed at [facility], [address], [place], [place] [address]     Radiology Studies: No results  found.   Scheduled Meds:  ALPRAZolam  0.5 mg Oral Once   amiodarone  400 mg Oral BID   apixaban  10 mg Oral BID   Followed by   Melene Muller ON 03/15/2023] apixaban  5 mg Oral BID   Chlorhexidine Gluconate Cloth  6 each Topical Daily   escitalopram  20 mg Oral Daily   ezetimibe  10 mg Oral Daily   [START ON 03/14/2023] furosemide  40 mg Oral Daily   insulin aspart  0-15 Units Subcutaneous TID WC   insulin aspart  0-5 Units Subcutaneous QHS   mirtazapine  15 mg Oral QHS   potassium chloride  40 mEq Oral Daily   rosuvastatin  40 mg Oral Daily   sodium chloride flush  10-40 mL Intracatheter Q12H   Continuous Infusions:   LOS: 6 days    Time spent:    Zannie Cove, MD [facility]   03/13/2023, 12:23 PM

## 2023-03-13 NOTE — TOC Progression Note (Addendum)
Transition of Care [facility]) - Progression Note    Patient Details  Name: Christian [name] MRN: [id number] Date of Birth: 02-11-1941  Transition of Care [facility]) CM/SW Contact  Elliot Cousin, RN Phone Number: [phone] 03/13/2023, 3:57 PM  Clinical Narrative:  HF TOC CM received referral for SNF rehab, patient will need a [place] Level 2 PASSR before going to SNF. Contacted wife to discuss SNF rehab, explained process for obtaining a [place] PASSR (requires Level 2 PASSR), and SNF rehab placement. Gave permission to create FL2 and fax referral to SNF rehab and [facility]idential [facility]. States she spoke to Palliative team about Hospice options. She agrees with Hospice (Residential) or SNF rehab, she had concerns about after his 21 days are complete, pt does not have the finances to extend his rehab at facility. Currently patient has a Comptroller and on suicide precautions. Updated attending. Patient will need to be 24 hours without a sitter to pursue SNF rehab and complete [place] PASSR Level 2. Attending will dc sitter. Gave order to send referral to [facility]idential [facility] to preview. Spoke to wife and offered choice. She is agreement to [facility], she had family at [facility] in the past. [facility] rep, [place] with referral. She will review and discuss with their Wellsite geologist. Will give CM a call back with update. If not appropriate with complete SNF rehab referral, with outpatient palliative sevices with [facility].     Expected Discharge Plan: Skilled Nursing Facility Barriers to Discharge: Requiring sitter/restraints  Expected Discharge Plan and Services   Discharge Planning Services: CM Consult Post Acute Care Choice: Hospice, Skilled Nursing Facility Living arrangements for the past 2 months: Single Family Home                 DME Arranged: 3-N-1 DME Agency: [employer] Date DME Agency Contacted: 03/11/23 Time DME Agency Contacted:  [phone] Representative spoke with at DME Agency: Shaune Leeks HH Arranged: PT, RN [facility] Agency: [facility] Date [facility] Agency Contacted: 03/10/23 Time HH Agency Contacted: [phone] Representative spoke with at [facility] Agency: Lorenza Chick   Social Determinants of Health (SDOH) Interventions SDOH Screenings   Food Insecurity: No Food Insecurity (03/10/2023)  Housing: Low Risk  (03/10/2023)  Transportation Needs: No Transportation Needs (03/10/2023)  Utilities: Not At Risk (03/10/2023)  Alcohol Screen: Low Risk  (10/12/2019)  Depression (PHQ2-9): Medium Risk (04/29/2022)  Financial Resource Strain: Low Risk  (03/10/2023)  Tobacco Use: Low Risk  (03/07/2023)    Readmission Risk Interventions     No data to display

## 2023-03-13 NOTE — Progress Notes (Addendum)
Advanced Heart Failure Rounding Note  PCP-Cardiologist: Kristeen Miss, MD   Subjective:   5/20 Started on DBA and diuresed with IV lasix. Lactic acid 1.8.  5/21 DBA turned down to 1.5 5/22 DBA off. Started po Torsemide. 5/23 Torsemide cut back to 20 mg daily .   Remains off DBA. CO-OX stable.    Scr 1.19>>1.34>>1.34 .  Slept all night. Sitter at bedside. Drowsy.    Objective:   Weight Range: 107.1 kg Body mass index is 38.11 kg/m.   Vital Signs:   Temp:  [97.6 F (36.4 C)-97.8 F (36.6 C)] 97.8 F (36.6 C) (05/24 0330) Pulse Rate:  [82-107] 82 (05/24 0330) Resp:  [0-53] 21 (05/24 0330) BP: (82-122)/(58-90) 87/58 (05/24 0330) SpO2:  [87 %-100 %] 97 % (05/24 0635) Weight:  [107.1 kg] 107.1 kg (05/24 0215) Last BM Date : 03/10/23  Weight change: Filed Weights   03/11/23 0657 03/12/23 0609 03/13/23 0215  Weight: 67.7 kg 68.4 kg 107.1 kg    Intake/Output:   Intake/Output Summary (Last 24 hours) at 03/13/2023 0752 Last data filed at 03/12/2023 1901 Gross per 24 hour  Intake 900 ml  Output 900 ml  Net 0 ml    CVP 2-3  Physical Exam   General:  Drowsy  HEENT: normal Neck: supple. no JVD. Carotids 2+ bilat; no bruits. No lymphadenopathy or thryomegaly appreciated. Cor: PMI nondisplaced. Regular rate & rhythm. No rubs, gallops or murmurs. Lungs: clear Abdomen: soft, nontender, nondistended. No hepatosplenomegaly. No bruits or masses. Good bowel sounds. Extremities: no cyanosis, clubbing, rash, edema. RUE PICC  Neuro: alert & orientedx3, cranial nerves grossly intact. moves all 4 extremities w/o difficulty. Affect pleasant  Telemetry  SR 80-90s   EKG    N/A   Labs    CBC Recent Labs    03/12/23 0612 03/13/23 0636  WBC 8.0 7.4  HGB 12.5* 12.5*  HCT 40.1 40.3  MCV 89.7 91.2  PLT 182 172   Basic Metabolic Panel Recent Labs    56/21/30 0612 03/13/23 0636  NA 138 137  K 3.7 3.7  CL 95* 92*  CO2 32 34*  GLUCOSE 96 123*  BUN 17 18   CREATININE 1.34* 1.34*  CALCIUM 8.4* 8.0*  MG 1.7 2.1   Liver Function Tests No results for input(s): "AST", "ALT", "ALKPHOS", "BILITOT", "PROT", "ALBUMIN" in the last 72 hours.  No results for input(s): "LIPASE", "AMYLASE" in the last 72 hours. Cardiac Enzymes No results for input(s): "CKTOTAL", "CKMB", "CKMBINDEX", "TROPONINI" in the last 72 hours.  BNP: BNP (last 3 results) Recent Labs    03/07/23 0952  BNP 2,668.0*    ProBNP (last 3 results) No results for input(s): "PROBNP" in the last 8760 hours.   D-Dimer No results for input(s): "DDIMER" in the last 72 hours.  Hemoglobin A1C No results for input(s): "HGBA1C" in the last 72 hours. Fasting Lipid Panel No results for input(s): "CHOL", "HDL", "LDLCALC", "TRIG", "CHOLHDL", "LDLDIRECT" in the last 72 hours. Thyroid Function Tests No results for input(s): "TSH", "T4TOTAL", "T3FREE", "THYROIDAB" in the last 72 hours.  Invalid input(s): "FREET3"   Other results:   Imaging    No results found.   Medications:     Scheduled Medications:  ALPRAZolam  0.5 mg Oral Once   amiodarone  400 mg Oral BID   apixaban  10 mg Oral BID   Followed by   Melene Muller ON 03/15/2023] apixaban  5 mg Oral BID   Chlorhexidine Gluconate Cloth  6 each Topical  Daily   escitalopram  20 mg Oral Daily   ezetimibe  10 mg Oral Daily   insulin aspart  0-15 Units Subcutaneous TID WC   insulin aspart  0-5 Units Subcutaneous QHS   mirtazapine  15 mg Oral QHS   potassium chloride  40 mEq Oral Daily   rosuvastatin  40 mg Oral Daily   sodium chloride flush  10-40 mL Intracatheter Q12H   torsemide  20 mg Oral Daily    Infusions:    PRN Medications: acetaminophen **OR** acetaminophen, ALPRAZolam, calcium carbonate, morphine CONCENTRATE, ondansetron **OR** ondansetron (ZOFRAN) IV, mouth rinse, sodium chloride flush    Patient Profile   82 y.o. male with history of NICM/chronic systolic CHF with recovered EF, nonobstructive CAD, hx CVA,  HTN, HLD, carotid artery disease. Admitted with acute bilateral PEs and a/c CHF.   Assessment/Plan   Acute on chronic systolic CHF: - Known hx CHF with prior EF < 20% in 2018. LHC with nonobstructive CAD. 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 Myeloma panel & urine immunofixation pending.  - Not a candidate for TAVR or advanced therapies with underlying dementia. Palliative care has been consulted. - Beta blocker stopped d/t concern for low-output - Off DBA. CO-OX remains stable. . - No BP room for GDMT - CVP 2-3 . Creatinine unchanged at 1.3 x 2 days. Stop torsemide.  Start lasix po tomorrow. 40 mg daily.     2. Bilateral PEs: - Suspect d/t low flow in setting of severe biventricular heart failure - Heparin off. Now on Eliquis.   3. Nonobstructive CAD/elevated troponin: - Nonobstructive CAD on LHC in 2018 - Troponin 161>096>045 - Likely demand ischemia in setting of PE and CHF - No chest pain.    4. Aortic valve stenosis: - Echo 03/07/23: EF < 20%, low-flow low-gradient  moderate AS with mean gradient 6 mmhg, AVA 1 cm2, DI 0.26, very low SV index, dilated IVC with estimated RAP 15 - Urine Immunofixation- negative.  - Not a candidate with dementia     5. ?Hepatic cirrhosis/liver lesion: - Noted on CTA.  - Abdomen MRI w/ and w/o contrast recommended   6. Cognitive impairment: - Worsening since prior stroke in 2021 - Seems to have significant short-term memory issue. Has not remembered me from day to day.  7.  A Fib RVR - Converted to SR with IV amio. -  Currently SR with PACs. Increase po amiodarone to 400 mg BID - On eliquis.   8. GOC: -DNR  -Ex-wife, [name], is  POA -Has limited options. Not a candidate for TAVR or advanced therapies d/t dementia -Palliative Care following.   9. Suicidal ideation: - Psychiatry following. Still has sitter at bedside.    Length of Stay: 6  Amy Clegg, NP  03/13/2023, 7:52 AM  Advanced Heart Failure Team Pager [phone] (M-F; 7a - 5p)  Please contact [facility] for night-coverage after hours (5p -7a ) and weekends on [email or web address]  Patient seen and examined with the above-signed Advanced Practice Provider and/or Housestaff. I personally reviewed laboratory data, imaging studies and relevant notes. I independently examined the patient and formulated the important aspects of the plan. I have  edited the note to reflect any of my changes or salient points. I have personally discussed the plan with the patient and/or family.  Has been cleared by Psychiatry. Drowsy this am. Confused. Denies CP or SOB. Remains in NSR. Co-ox ok. CVP low  General:  Drowsy confused No resp difficulty HEENT: normal Neck: supple. no JVD. No lymphadenopathy or thryomegaly appreciated. Cor:  Regular rate & rhythm. 2/6 AS Lungs: clear Abdomen: soft, nontender, nondistended. No hepatosplenomegaly. No bruits or masses. Good bowel sounds. Extremities: no cyanosis, clubbing, rash, edema Neuro: drowsy confused  He is stable. Not candidate for any cardiac procedures. He is DNR and planning d/c to SNF. (Has chosen not to pursue Hospice - which I think he needs)   At discharge would use   Amiodarone 400 bid x 1 week then 200 bid Eliquis 10 bid x 1 week then 5 bid Lasix 40 mg daily   Currently no room for other GDMT.   HF team will s/o. Please call with questions.   Arvilla Meres, MD  10:17 AM

## 2023-03-14 DIAGNOSIS — I5043 Acute on chronic combined systolic (congestive) and diastolic (congestive) heart failure: Secondary | ICD-10-CM | POA: Diagnosis not present

## 2023-03-14 LAB — CBC
HCT: 39.3 % (ref 39.0–52.0)
Hemoglobin: 12.1 g/dL — ABNORMAL LOW (ref 13.0–17.0)
MCH: 28.4 pg (ref 26.0–34.0)
MCHC: 30.8 g/dL (ref 30.0–36.0)
MCV: 92.3 fL (ref 80.0–100.0)
Platelets: 173 10*3/uL (ref 150–400)
RBC: 4.26 MIL/uL (ref 4.22–5.81)
RDW: 14.8 % (ref 11.5–15.5)
WBC: 8.7 10*3/uL (ref 4.0–10.5)
nRBC: 0 % (ref 0.0–0.2)

## 2023-03-14 LAB — MAGNESIUM: Magnesium: 2.1 mg/dL (ref 1.7–2.4)

## 2023-03-14 LAB — BASIC METABOLIC PANEL
Anion gap: 11 (ref 5–15)
BUN: 18 mg/dL (ref 8–23)
CO2: 29 mmol/L (ref 22–32)
Calcium: 8 mg/dL — ABNORMAL LOW (ref 8.9–10.3)
Chloride: 96 mmol/L — ABNORMAL LOW (ref 98–111)
Creatinine, Ser: 1.19 mg/dL (ref 0.61–1.24)
GFR, Estimated: >60 mL/min (ref >60)
Glucose, Bld: 145 mg/dL — ABNORMAL HIGH (ref 70–99)
Potassium: 3.6 mmol/L (ref 3.5–5.1)
Sodium: 136 mmol/L (ref 135–145)

## 2023-03-14 LAB — COOXEMETRY PANEL
Carboxyhemoglobin: 2.3 % — ABNORMAL HIGH (ref 0.5–1.5)
Methemoglobin: <0.7 % (ref 0.0–1.5)
O2 Saturation: 81.1 %
Total hemoglobin: 12.6 g/dL (ref 12.0–16.0)

## 2023-03-14 LAB — GLUCOSE, CAPILLARY
Glucose-Capillary: 156 mg/dL — ABNORMAL HIGH (ref 70–99)
Glucose-Capillary: 125 mg/dL — ABNORMAL HIGH (ref 70–99)
Glucose-Capillary: 142 mg/dL — ABNORMAL HIGH (ref 70–99)
Glucose-Capillary: 122 mg/dL — ABNORMAL HIGH (ref 70–99)

## 2023-03-14 NOTE — TOC Progression Note (Signed)
Transition of Care [facility]) - Progression Note    Patient Details  Name: Christian [name] MRN: [id number] Date of Birth: [birth year]  Transition of Care [facility]) CM/SW Contact  Elliot Cousin, RN Phone Number: [phone] 03/14/2023, 3:41 PM  Clinical Narrative:   Received from [facility], their Medical Director did not approve a Residential Hospice bed for him at this time. [facility] will follow pt at [facility] for outpatient palliative services. Hospice rep, Lanora Manis will contact wife with update. Message sent to CSW for SNF rehab. Attending updated.     Expected Discharge Plan: Skilled Nursing Facility Barriers to Discharge: Requiring sitter/restraints  Expected Discharge Plan and Services   Discharge Planning Services: CM Consult Post Acute Care Choice: Hospice, Skilled Nursing Facility Living arrangements for the past 2 months: Single Family Home                 DME Arranged: 3-N-1 DME Agency: [employer] Date DME Agency Contacted: 03/11/23 Time DME Agency Contacted: [phone] Representative spoke with at DME Agency: Shaune Leeks HH Arranged: PT, RN [facility] Agency: [facility] Date [facility] Agency Contacted: 03/10/23 Time HH Agency Contacted: [phone] Representative spoke with at [facility] Agency: Lorenza Chick   Social Determinants of Health (SDOH) Interventions SDOH Screenings   Food Insecurity: No Food Insecurity (03/10/2023)  Housing: Low Risk  (03/10/2023)  Transportation Needs: No Transportation Needs (03/10/2023)  Utilities: Not At Risk (03/10/2023)  Alcohol Screen: Low Risk  (10/12/2019)  Depression (PHQ2-9): Medium Risk (04/29/2022)  Financial Resource Strain: Low Risk  (03/10/2023)  Tobacco Use: Low Risk  (03/07/2023)    Readmission Risk Interventions     No data to display

## 2023-03-14 NOTE — Progress Notes (Signed)
PROGRESS NOTE    Christian [name]  ZOX:[id number] DOB: 1941-08-02 DOA: 03/07/2023 PCP: [name], PA  [name] with history of chronic systolic CHF with recovered EF, NICM, CVA, hypertension, type 2 diabetes mellitus,?  Early dementia presented to the ED with dyspnea on exertion X several months worsened over 2 days. -Workup in the ED noted bilateral pulmonary emboli and CHF -Admitted, 5/18 started on IV heparin -5/19, diuretics started, echo noted EF down to<20% -5/20 advanced heart failure team consulted, transferred to SDU for dobutamine -5/21 tachyarrhythmia started on amiodarone palliative care consulted -5/22, reported suicidal ideation, psych consulted, dobutamine discontinued -5/23, palliative meeting again, patient and ex-wife decided to pursue SNF for short-term rehab with palliative care  Subjective: -Pleasantly confused, no events overnight  Assessment and Plan:  Acute on chronic combined systolic and diastolic CHF - -EF previously <81% in 2018, improved to 60-65% in 07/2018  -Repeat echo this admission with <20%, moderately reduced RV, moderate aortic stenosis  -LHC 2018 with nonobstructive CAD -sp inotropic support with dobutamine, discontinued 5/22 -GDMT limited by hypotension  -Overall prognosis is felt to be poor and not a candidate for advanced therapies/TAVR, palliative care following -BP remains low, GDMT limited, avoid SGLT2i with urinary retention/Foley -Discharge planning, residential hospice being considered  Acute pulmonary embolism without acute cor pulmonale CTA chest on admission showed bilateral PE. - Continue Eliquis  Major depression, suicidal ideation -Psych consulted, also has cognitive deficits which interfere with evaluation and care  Goals of care: Frail elderly male with severe cardiomyopathy and cognitive deficits with ongoing FTT, palliative consulted, now DNR, conservative management, continue current care  Paroxysmal atrial fibrillation  (HCC) -new onset.  TSH normal. Echo without valvular disease. CHA2DS2-Vasc 7  -Was on IV amiodarone, now converted to sinus rhythm, changed to oral amiodarone, continue Eliquis  Acute urinary retention Overnight 5/20, requiring repeated I/O cath, >1L return both times. -Foley catheter placed 5/21, will DC to SNF with Foley, will need voiding trial once more ambulatory  Liver lesion, liver cirrhosis CTA on admission showed radiographic indications of cirrhosis and known right heart failure as well as 2.3 cm indeterminate lesion. - Consider MRI abdomen if he improves functionally down the road,  Mood disorder (HCC) - Continue Lexapro  Cerebrovascular disease See above  Benign essential HTN Blood pressure low - Meds as above  CAD PTA Aspirin and Plavix stopped here.  Now on apixaban - Continue Zetia  Type 2 diabetes mellitus with complication, without long-term current use of insulin (HCC) A1c 7.6% last Jan -stable, SSI - Hold metformin  Hyperlipidemia - Continue Zetia  DVT prophylaxis: Apixaban Code Status: DNR Family Communication: None present, will update ex-wife Disposition Plan: Residential hospice versus SNF with palliative care  Consultants: Heart failure team, palliative care, psych   Procedures:   Antimicrobials:    Objective: Vitals:   03/14/23 0409 03/14/23 0500 03/14/23 0648 03/14/23 0807  BP: 95/67   101/63  Pulse: 82 88  84  Resp: 20   19  Temp: 98 F (36.7 C)   98 F (36.7 C)  TempSrc: Oral   Oral  SpO2: 90% 92% 96% 94%  Weight: 70.2 kg     Height:        Intake/Output Summary (Last 24 hours) at 03/14/2023 0855 Last data filed at 03/14/2023 0757 Gross per 24 hour  Intake 240 ml  Output 1000 ml  Net -760 ml   Filed Weights   03/13/23 0215 03/13/23 1404 03/14/23 0409  Weight: 107.1 kg 69.7 kg  70.2 kg    Examination:  General exam: Chronically ill elderly male laying in bed, hard of hearing, mild cognitive deficits CVS: S1-S2,  regular rhythm Lungs: Clear bilaterally Abdomen: Soft, nontender, bowel sounds present Extremities: No edema, right arm PICC line noted Psychiatry: Poor insight and judgment, cognitive deficits    Data Reviewed:   CBC: Recent Labs  Lab 03/07/23 0952 03/08/23 0231 03/10/23 0420 03/11/23 0352 03/12/23 0612 03/13/23 0636 03/14/23 0605  WBC 9.1   < > 6.2 6.7 8.0 7.4 8.7  NEUTROABS 6.1  --   --   --   --   --   --   HGB 12.8*   < > 12.8* 11.5* 12.5* 12.5* 12.1*  HCT 40.9   < > 40.9 36.7* 40.1 40.3 39.3  MCV 93.2   < > 90.5 91.3 89.7 91.2 92.3  PLT 217   < > 176 162 182 172 173   < > = values in this interval not displayed.   Basic Metabolic Panel: Recent Labs  Lab 03/07/23 0952 03/08/23 0231 03/10/23 0420 03/11/23 0352 03/12/23 0612 03/13/23 0636 03/14/23 0605  NA 138   < > 140 132* 138 137 136  K 3.7   < > 3.5 3.9 3.7 3.7 3.6  CL 102   < > 98 93* 95* 92* 96*  CO2 25   < > 29 29 32 34* 29  GLUCOSE 139*   < > 122* 258* 96 123* 145*  BUN 26*   < > 15 20 17 18 18   CREATININE 1.21   < > 1.13 1.19 1.34* 1.34* 1.19  CALCIUM 8.3*   < > 8.6* 8.1* 8.4* 8.0* 8.0*  MG 1.9  --   --  1.8 1.7 2.1 2.1   < > = values in this interval not displayed.   GFR: Estimated Creatinine Clearance: 43.2 mL/min (by C-G formula based on SCr of 1.19 mg/dL). Liver Function Tests: Recent Labs  Lab 03/08/23 0231  AST 20  ALT 16  ALKPHOS 41  BILITOT 1.6*  PROT 5.2*  ALBUMIN 3.1*   No results for input(s): "LIPASE", "AMYLASE" in the last 168 hours. No results for input(s): "AMMONIA" in the last 168 hours. Coagulation Profile: No results for input(s): "INR", "PROTIME" in the last 168 hours. Cardiac Enzymes: No results for input(s): "CKTOTAL", "CKMB", "CKMBINDEX", "TROPONINI" in the last 168 hours. BNP (last 3 results) No results for input(s): "PROBNP" in the last 8760 hours. HbA1C: No results for input(s): "HGBA1C" in the last 72 hours. CBG: Recent Labs  Lab 03/13/23 0620  03/13/23 1144 03/13/23 1625 03/13/23 2121 03/14/23 0654  GLUCAP 121* 131* 142* 180* 156*   Lipid Profile: No results for input(s): "CHOL", "HDL", "LDLCALC", "TRIG", "CHOLHDL", "LDLDIRECT" in the last 72 hours. Thyroid Function Tests: No results for input(s): "TSH", "T4TOTAL", "FREET4", "T3FREE", "THYROIDAB" in the last 72 hours. Anemia Panel: No results for input(s): "VITAMINB12", "FOLATE", "FERRITIN", "TIBC", "IRON", "RETICCTPCT" in the last 72 hours. Urine analysis:    Component Value Date/Time   COLORURINE YELLOW 01/06/2018 1602   APPEARANCEUR Clear 04/07/2018 1459   LABSPEC 1.020 01/06/2018 1602   PHURINE 6.5 01/06/2018 1602   GLUCOSEU Negative 04/07/2018 1459   GLUCOSEU NEGATIVE 01/06/2018 1602   HGBUR NEGATIVE 01/06/2018 1602   BILIRUBINUR 1 01/22/2023 1143   BILIRUBINUR Negative 04/07/2018 1459   KETONESUR negative 10/06/2018 1634   KETONESUR TRACE (A) 01/06/2018 1602   PROTEINUR Positive (A) 01/22/2023 1143   PROTEINUR Negative 04/07/2018 1459   UROBILINOGEN 1.0  01/22/2023 1143   UROBILINOGEN 0.2 01/06/2018 1602   NITRITE Negative 01/22/2023 1143   NITRITE Negative 04/07/2018 1459   NITRITE NEGATIVE 01/06/2018 1602   LEUKOCYTESUR Negative 01/22/2023 1143   LEUKOCYTESUR Negative 04/07/2018 1459   Sepsis Labs: @LABRCNTIP (procalcitonin:4,lacticidven:4)  ) Recent Results (from the past 240 hour(s))  SARS Coronavirus 2 by RT PCR (hospital order, performed in [facility] hospital lab) *cepheid single result test* Anterior Nasal Swab     Status: None   Collection Time: 03/07/23  9:46 AM   Specimen: Anterior Nasal Swab  Result Value Ref Range Status   SARS Coronavirus 2 by RT PCR NEGATIVE NEGATIVE Final    Comment: Performed at [facility], [address], [place], [place] [address]     Radiology Studies: No results found.   Scheduled Meds:  ALPRAZolam  0.5 mg Oral Once   amiodarone  400 mg Oral BID   apixaban  10 mg Oral BID   Followed by   Melene Muller ON  03/15/2023] apixaban  5 mg Oral BID   Chlorhexidine Gluconate Cloth  6 each Topical Daily   escitalopram  20 mg Oral Daily   ezetimibe  10 mg Oral Daily   furosemide  40 mg Oral Daily   insulin aspart  0-15 Units Subcutaneous TID WC   insulin aspart  0-5 Units Subcutaneous QHS   mirtazapine  15 mg Oral QHS   potassium chloride  40 mEq Oral Daily   rosuvastatin  40 mg Oral Daily   sodium chloride flush  10-40 mL Intracatheter Q12H   Continuous Infusions:   LOS: 7 days    Time spent:    Zannie Cove, MD [facility]   03/14/2023, 8:55 AM

## 2023-03-14 NOTE — Plan of Care (Signed)
  Problem: Education: Goal: Ability to demonstrate management of disease process will improve Outcome: Progressing Goal: Ability to verbalize understanding of medication therapies will improve Outcome: Progressing Goal: Individualized Educational Video(s) Outcome: Progressing   Problem: Activity: Goal: Capacity to carry out activities will improve Outcome: Progressing   Problem: Cardiac: Goal: Ability to achieve and maintain adequate cardiopulmonary perfusion will improve Outcome: Progressing   Problem: Education: Goal: Ability to describe self-care measures that may prevent or decrease complications (Diabetes Survival Skills Education) will improve Outcome: Progressing Goal: Individualized Educational Video(s) Outcome: Progressing   Problem: Coping: Goal: Ability to adjust to condition or change in health will improve Outcome: Progressing   Problem: Fluid Volume: Goal: Ability to maintain a balanced intake and output will improve Outcome: Progressing   Problem: Health Behavior/Discharge Planning: Goal: Ability to identify and utilize available resources and services will improve Outcome: Progressing Goal: Ability to manage health-related needs will improve Outcome: Progressing   Problem: Metabolic: Goal: Ability to maintain appropriate glucose levels will improve Outcome: Progressing   Problem: Nutritional: Goal: Maintenance of adequate nutrition will improve Outcome: Progressing Goal: Progress toward achieving an optimal weight will improve Outcome: Progressing   Problem: Skin Integrity: Goal: Risk for impaired skin integrity will decrease Outcome: Progressing   Problem: Tissue Perfusion: Goal: Adequacy of tissue perfusion will improve Outcome: Progressing   Problem: Education: Goal: Knowledge of General Education information will improve Description: Including pain rating scale, medication(s)/side effects and non-pharmacologic comfort measures Outcome:  Progressing   Problem: Health Behavior/Discharge Planning: Goal: Ability to manage health-related needs will improve Outcome: Progressing   Problem: Clinical Measurements: Goal: Ability to maintain clinical measurements within normal limits will improve Outcome: Progressing Goal: Will remain free from infection Outcome: Progressing Goal: Diagnostic test results will improve Outcome: Progressing Goal: Respiratory complications will improve Outcome: Progressing Goal: Cardiovascular complication will be avoided Outcome: Progressing   Problem: Activity: Goal: Risk for activity intolerance will decrease Outcome: Progressing   Problem: Nutrition: Goal: Adequate nutrition will be maintained Outcome: Progressing   Problem: Coping: Goal: Level of anxiety will decrease Outcome: Progressing   Problem: Elimination: Goal: Will not experience complications related to bowel motility Outcome: Progressing Goal: Will not experience complications related to urinary retention Outcome: Progressing   Problem: Pain Managment: Goal: General experience of comfort will improve Outcome: Progressing   Problem: Safety: Goal: Ability to remain free from injury will improve Outcome: Progressing   Problem: Skin Integrity: Goal: Risk for impaired skin integrity will decrease Outcome: Progressing   Problem: Education: Goal: Ability to demonstrate management of disease process will improve Outcome: Progressing Goal: Ability to verbalize understanding of medication therapies will improve Outcome: Progressing Goal: Individualized Educational Video(s) Outcome: Progressing   Problem: Activity: Goal: Capacity to carry out activities will improve Outcome: Progressing   Problem: Cardiac: Goal: Ability to achieve and maintain adequate cardiopulmonary perfusion will improve Outcome: Progressing   

## 2023-03-14 NOTE — Progress Notes (Signed)
Mobility Specialist Progress Note    03/14/23 1543  Mobility  Activity Stood at bedside  Level of Assistance Moderate assist, patient does 50-74%  Assistive Device Four wheel walker  Activity Response Tolerated fair  Mobility Referral Yes  $Mobility charge 1 Mobility  Mobility Specialist Start Time (ACUTE ONLY) 1522  Mobility Specialist Stop Time (ACUTE ONLY) 1542  Mobility Specialist Time Calculation (min) (ACUTE ONLY) 20 min   Pre-Mobility: 89 HR, 90% SpO2 Post-Mobility: 97 HR, 96% SpO2  Pt received and agreeable. Initially required verbal cues for arousal. Pt required modA for bed mobility and to complete x3 STS. Pt with lateral lean when seated EOB and posterior lean when standing. Deferred ambulation attempt. Pt c/o fatigue. Returned to supine with call bell in reach and bed alarm on. RN notified.   [place] Mobility Specialist  Please Neurosurgeon or Rehab Office at [phone]

## 2023-03-15 DIAGNOSIS — R0602 Shortness of breath: Secondary | ICD-10-CM | POA: Diagnosis not present

## 2023-03-15 DIAGNOSIS — I5043 Acute on chronic combined systolic (congestive) and diastolic (congestive) heart failure: Secondary | ICD-10-CM | POA: Diagnosis not present

## 2023-03-15 DIAGNOSIS — Z515 Encounter for palliative care: Secondary | ICD-10-CM | POA: Diagnosis not present

## 2023-03-15 DIAGNOSIS — R531 Weakness: Secondary | ICD-10-CM | POA: Diagnosis not present

## 2023-03-15 LAB — GLUCOSE, CAPILLARY
Glucose-Capillary: 109 mg/dL — ABNORMAL HIGH (ref 70–99)
Glucose-Capillary: 184 mg/dL — ABNORMAL HIGH (ref 70–99)
Glucose-Capillary: 120 mg/dL — ABNORMAL HIGH (ref 70–99)
Glucose-Capillary: 144 mg/dL — ABNORMAL HIGH (ref 70–99)

## 2023-03-15 LAB — CBC
HCT: 40.4 % (ref 39.0–52.0)
Hemoglobin: 12.4 g/dL — ABNORMAL LOW (ref 13.0–17.0)
MCH: 28.1 pg (ref 26.0–34.0)
MCHC: 30.7 g/dL (ref 30.0–36.0)
MCV: 91.4 fL (ref 80.0–100.0)
Platelets: 175 10*3/uL (ref 150–400)
RBC: 4.42 MIL/uL (ref 4.22–5.81)
RDW: 14.6 % (ref 11.5–15.5)
WBC: 7.7 10*3/uL (ref 4.0–10.5)
nRBC: 0 % (ref 0.0–0.2)

## 2023-03-15 LAB — COOXEMETRY PANEL
Carboxyhemoglobin: 1.7 % — ABNORMAL HIGH (ref 0.5–1.5)
Methemoglobin: <0.7 % (ref 0.0–1.5)
O2 Saturation: 70.6 %
Total hemoglobin: 12.9 g/dL (ref 12.0–16.0)

## 2023-03-15 LAB — BASIC METABOLIC PANEL
Anion gap: 10 (ref 5–15)
BUN: 17 mg/dL (ref 8–23)
CO2: 29 mmol/L (ref 22–32)
Calcium: 7.8 mg/dL — ABNORMAL LOW (ref 8.9–10.3)
Chloride: 96 mmol/L — ABNORMAL LOW (ref 98–111)
Creatinine, Ser: 1.23 mg/dL (ref 0.61–1.24)
GFR, Estimated: 59 mL/min — ABNORMAL LOW (ref >60)
Glucose, Bld: 122 mg/dL — ABNORMAL HIGH (ref 70–99)
Potassium: 3.7 mmol/L (ref 3.5–5.1)
Sodium: 135 mmol/L (ref 135–145)

## 2023-03-15 LAB — MAGNESIUM: Magnesium: 2 mg/dL (ref 1.7–2.4)

## 2023-03-15 NOTE — Progress Notes (Signed)
Patient ID: Christian [name], male   DOB: 1941-02-22, 82 y.o.   MRN: [id number]    Progress Note from the Palliative Medicine Team at [facility]   Patient Name: Christian [name]        Date: 03/15/2023 DOB: 07-Oct-1941  Age: 82 y.o. MRN#: [id number] Attending Physician: Zannie Cove, MD Primary Care Physician: [name], [place] Admit Date: 03/07/2023   Medical records reviewed, assessed patient  82 y.o. male  with past medical history of stroke with mild cognitive impairment, hoh, CHF, HTN, HLD, CAD, DM with neuropathy,  admitted on 03/07/2023 with bilateral PE and heart failure exacerbation. Required dobutamine for diuresis, now stopped.   Hospitalization complicated by patient's verbalized suicidal ideations, Psychiatry on board.  Primary Decision Maker PATIENT - surrogate decision maker is ex-spouse- Christian, he has a sister that he has been estranged from for over 30 years, unable to be contacted- no other family- Christian [name] is agreeable to be surrogate if needed   This NP assessed patient at the bedside as a follow up for palliative medicine needs and emotional support. No family at bedside, patient is sleepy and "not in the mood to talk"    Spoke to patient's ex-spouse and current  main support person/Lauraine [name] by telephone.  Several changes to decision regarding next steps in plan of care over the past 48 hrs.    Continued conversation regarding patient current medical and psychiatric conditions.    Plan at this time is  that once medically stable for discharge, to pursue short-term rehab at [facility].  I did verbalize my concern that patient is high risk for decompensation secondary to multiple co-morbidities.   Recommend OP Palliative services and transition to hospice when eligible.   Education offered on hospice benefit; philosophy and eligibility   Education offered today regarding  the importance of continued conversation with family and their  medical providers  regarding overall plan of care and treatment options,  ensuring decisions are within the context of the patients values and GOCs.  Questions and concerns addressed   Discussed with bedside RN    Time: 50  minutes  Detailed review of medical records ( labs, imaging, vital signs), medically appropriate exam ( MS, skin, resp)   discussed with treatment team, counseling and education to patient, family, staff, documenting clinical information, medication management, coordination of care    Christian Creed NP  Palliative Medicine Team Team Phone # [phone] Pager [phone]

## 2023-03-15 NOTE — Progress Notes (Signed)
Mobility Specialist Progress Note    03/15/23 1531  Mobility  Activity Ambulated with assistance in hallway  Level of Assistance Minimal assist, patient does 75% or more  Assistive Device Front wheel walker  Distance Ambulated (ft) 340 ft  Activity Response Tolerated well  Mobility Referral Yes  $Mobility charge 1 Mobility  Mobility Specialist Start Time (ACUTE ONLY) 1508  Mobility Specialist Stop Time (ACUTE ONLY) 1529  Mobility Specialist Time Calculation (min) (ACUTE ONLY) 21 min   Pre-Mobility: 86 HR, 90% SpO2 Post-Mobility: 109 HR  Pt received in bed and agreeable. Pt c/o fatigue but "want to keep going". Returned to chair with call bell in reach and chair alarm on. RN aware.   [place] Mobility Specialist  Please Neurosurgeon or Rehab Office at [phone]

## 2023-03-15 NOTE — TOC Progression Note (Signed)
Transition of Care [facility]) - Progression Note    Patient Details  Name: Christian [name] MRN: [id number] Date of Birth: July 19, 1941  Transition of Care [facility]) CM/SW Contact  Patrice Paradise, LCSW Phone Number: 03/15/2023, 8:35 AM  Clinical Narrative:     CSW was notified that pt hospice referral had been denied.CSW was informed that SNF may need to be pursued. CSW stated that pt doesn't have a SNF recommendation. CSW stated that new PT recs would need to be ordered and SNF recommendation before SNF could be pursued.  TOC team will continue to assist with discharge planning needs.   Expected Discharge Plan: Skilled Nursing Facility Barriers to Discharge: Requiring sitter/restraints  Expected Discharge Plan and Services   Discharge Planning Services: CM Consult Post Acute Care Choice: Hospice, Skilled Nursing Facility Living arrangements for the past 2 months: Single Family Home                 DME Arranged: 3-N-1 DME Agency: [employer] Date DME Agency Contacted: 03/11/23 Time DME Agency Contacted: [phone] Representative spoke with at DME Agency: Shaune Leeks HH Arranged: PT, RN [facility] Agency: [facility] Date [facility] Agency Contacted: 03/10/23 Time HH Agency Contacted: [phone] Representative spoke with at [facility] Agency: Lorenza Chick   Social Determinants of Health (SDOH) Interventions SDOH Screenings   Food Insecurity: No Food Insecurity (03/10/2023)  Housing: Low Risk  (03/10/2023)  Transportation Needs: No Transportation Needs (03/10/2023)  Utilities: Not At Risk (03/10/2023)  Alcohol Screen: Low Risk  (10/12/2019)  Depression (PHQ2-9): Medium Risk (04/29/2022)  Financial Resource Strain: Low Risk  (03/10/2023)  Tobacco Use: Low Risk  (03/07/2023)    Readmission Risk Interventions   No data to display

## 2023-03-15 NOTE — Progress Notes (Signed)
PROGRESS NOTE    Christian [name]  ZOX:[id number] DOB: [birth year] DOA: 03/07/2023 PCP: [name], PA  [name] with history of chronic systolic CHF with recovered EF, NICM, CVA, hypertension, type 2 diabetes mellitus,?  Early dementia presented to the ED with dyspnea on exertion X several months worsened over 2 days. -Workup in the ED noted bilateral pulmonary emboli and CHF -Admitted, 5/18 started on IV heparin -5/19, diuretics started, echo noted EF down to<20% -5/20 advanced heart failure team consulted, transferred to SDU for dobutamine -5/21 tachyarrhythmia started on amiodarone palliative care consulted -5/22, reported suicidal ideation, psych consulted, dobutamine discontinued -5/23, palliative meeting again, patient and ex-wife decided to pursue SNF for short-term rehab with palliative care  Subjective: -Feels okay, denies any complaints, sitting up in bed eating breakfast  Assessment and Plan:  Acute on chronic combined systolic and diastolic CHF - -EF previously <81% in 2018, improved to 60-65% in 07/2018  -Repeat echo this admission with <20%, moderately reduced RV, moderate aortic stenosis  -LHC 2018 with nonobstructive CAD -sp inotropic support with dobutamine, discontinued 5/22 -GDMT limited by hypotension  -Overall prognosis is felt to be poor and not a candidate for advanced therapies/TAVR, palliative care following -avoid SGLT2i with urinary retention/Foley -Discharge planning, apparently did not meet criteria for residential hospice, plan for SNF with palliative care and transition to hospice as he declines further, TOC following  Acute pulmonary embolism without acute cor pulmonale CTA chest on admission showed bilateral PE. - Continue Eliquis  Major depression, suicidal ideation -Psych consulted, also has cognitive deficits which interfere with evaluation and care  Goals of care: Frail elderly male with severe cardiomyopathy and cognitive deficits with ongoing  FTT, palliative consulted, now DNR, conservative management, continue current care  Paroxysmal atrial fibrillation (HCC) -new onset.  TSH normal. Echo without valvular disease. CHA2DS2-Vasc 7  -Was on IV amiodarone, now converted to sinus rhythm, changed to oral amiodarone, continue Eliquis  Acute urinary retention Overnight 5/20, requiring repeated I/O cath, >1L return both times. -Foley catheter placed 5/21, will DC to SNF with Foley, will need voiding trial once more ambulatory  Liver lesion, liver cirrhosis CTA on admission showed radiographic indications of cirrhosis and known right heart failure as well as 2.3 cm indeterminate lesion. - Consider MRI abdomen if he improves functionally down the road,  Mood disorder (HCC) - Continue Lexapro  Cerebrovascular disease See above  Benign essential HTN Blood pressure low - Meds as above  CAD PTA Aspirin and Plavix stopped here.  Now on apixaban - Continue Zetia  Type 2 diabetes mellitus with complication, without long-term current use of insulin (HCC) A1c 7.6% last Jan -stable, SSI - Hold metformin  Hyperlipidemia - Continue Zetia  DVT prophylaxis: Apixaban Code Status: DNR Family Communication: None present, will update ex-wife Disposition Plan: Residential hospice versus SNF with palliative care  Consultants: Heart failure team, palliative care, psych   Procedures:   Antimicrobials:    Objective: Vitals:   03/15/23 0415 03/15/23 0500 03/15/23 0600 03/15/23 0800  BP: 105/80   (!) 83/55  Pulse: 100  84 87  Resp: 17  15   Temp: 97.7 F (36.5 C)   98 F (36.7 C)  TempSrc: Oral   Oral  SpO2: 97%  96% 90%  Weight:  69.1 kg    Height:        Intake/Output Summary (Last 24 hours) at 03/15/2023 1001 Last data filed at 03/15/2023 0800 Gross per 24 hour  Intake 720 ml  Output 775 ml  Net -55 ml   Filed Weights   03/13/23 1404 03/14/23 0409 03/15/23 0500  Weight: 69.7 kg 70.2 kg 69.1 kg     Examination:  General exam: Elderly chronically ill male laying in bed, hard of hearing with mild cognitive deficits CVS: S1-S2, regular rhythm Lungs: Clear bilaterally Abdomen: Soft, nontender, bowel sounds present Extremities: No edema , right arm PICC line noted Psychiatry: Poor insight and judgment, cognitive deficits    Data Reviewed:   CBC: Recent Labs  Lab 03/11/23 0352 03/12/23 0612 03/13/23 0636 03/14/23 0605 03/15/23 0516  WBC 6.7 8.0 7.4 8.7 7.7  HGB 11.5* 12.5* 12.5* 12.1* 12.4*  HCT 36.7* 40.1 40.3 39.3 40.4  MCV 91.3 89.7 91.2 92.3 91.4  PLT 162 182 172 173 175   Basic Metabolic Panel: Recent Labs  Lab 03/11/23 0352 03/12/23 0612 03/13/23 0636 03/14/23 0605 03/15/23 0516  NA 132* 138 137 136 135  K 3.9 3.7 3.7 3.6 3.7  CL 93* 95* 92* 96* 96*  CO2 29 32 34* 29 29  GLUCOSE 258* 96 123* 145* 122*  BUN 20 17 18 18 17   CREATININE 1.19 1.34* 1.34* 1.19 1.23  CALCIUM 8.1* 8.4* 8.0* 8.0* 7.8*  MG 1.8 1.7 2.1 2.1 2.0   GFR: Estimated Creatinine Clearance: 41.8 mL/min (by C-G formula based on SCr of 1.23 mg/dL). Liver Function Tests: No results for input(s): "AST", "ALT", "ALKPHOS", "BILITOT", "PROT", "ALBUMIN" in the last 168 hours.  No results for input(s): "LIPASE", "AMYLASE" in the last 168 hours. No results for input(s): "AMMONIA" in the last 168 hours. Coagulation Profile: No results for input(s): "INR", "PROTIME" in the last 168 hours. Cardiac Enzymes: No results for input(s): "CKTOTAL", "CKMB", "CKMBINDEX", "TROPONINI" in the last 168 hours. BNP (last 3 results) No results for input(s): "PROBNP" in the last 8760 hours. HbA1C: No results for input(s): "HGBA1C" in the last 72 hours. CBG: Recent Labs  Lab 03/14/23 0654 03/14/23 1142 03/14/23 1531 03/14/23 2106 03/15/23 0655  GLUCAP 156* 125* 142* 122* 109*   Lipid Profile: No results for input(s): "CHOL", "HDL", "LDLCALC", "TRIG", "CHOLHDL", "LDLDIRECT" in the last 72  hours. Thyroid Function Tests: No results for input(s): "TSH", "T4TOTAL", "FREET4", "T3FREE", "THYROIDAB" in the last 72 hours. Anemia Panel: No results for input(s): "VITAMINB12", "FOLATE", "FERRITIN", "TIBC", "IRON", "RETICCTPCT" in the last 72 hours. Urine analysis:    Component Value Date/Time   COLORURINE YELLOW 01/06/2018 1602   APPEARANCEUR Clear 04/07/2018 1459   LABSPEC 1.020 01/06/2018 1602   PHURINE 6.5 01/06/2018 1602   GLUCOSEU Negative 04/07/2018 1459   GLUCOSEU NEGATIVE 01/06/2018 1602   HGBUR NEGATIVE 01/06/2018 1602   BILIRUBINUR 1 01/22/2023 1143   BILIRUBINUR Negative 04/07/2018 1459   KETONESUR negative 10/06/2018 1634   KETONESUR TRACE (A) 01/06/2018 1602   PROTEINUR Positive (A) 01/22/2023 1143   PROTEINUR Negative 04/07/2018 1459   UROBILINOGEN 1.0 01/22/2023 1143   UROBILINOGEN 0.2 01/06/2018 1602   NITRITE Negative 01/22/2023 1143   NITRITE Negative 04/07/2018 1459   NITRITE NEGATIVE 01/06/2018 1602   LEUKOCYTESUR Negative 01/22/2023 1143   LEUKOCYTESUR Negative 04/07/2018 1459   Sepsis Labs: @LABRCNTIP (procalcitonin:4,lacticidven:4)  ) Recent Results (from the past 240 hour(s))  SARS Coronavirus 2 by RT PCR (hospital order, performed in [facility] hospital lab) *cepheid single result test* Anterior Nasal Swab     Status: None   Collection Time: 03/07/23  9:46 AM   Specimen: Anterior Nasal Swab  Result Value Ref Range Status   SARS Coronavirus 2 by RT PCR NEGATIVE  NEGATIVE Final    Comment: Performed at [facility], [address], [place], [place] [address]     Radiology Studies: No results found.   Scheduled Meds:  amiodarone  400 mg Oral BID   apixaban  5 mg Oral BID   Chlorhexidine Gluconate Cloth  6 each Topical Daily   escitalopram  20 mg Oral Daily   ezetimibe  10 mg Oral Daily   furosemide  40 mg Oral Daily   insulin aspart  0-15 Units Subcutaneous TID WC   insulin aspart  0-5 Units Subcutaneous QHS   mirtazapine  15 mg  Oral QHS   potassium chloride  40 mEq Oral Daily   rosuvastatin  40 mg Oral Daily   sodium chloride flush  10-40 mL Intracatheter Q12H   Continuous Infusions:   LOS: 8 days    Time spent:    Zannie Cove, MD [facility]   03/15/2023, 10:01 AM

## 2023-03-16 DIAGNOSIS — I480 Paroxysmal atrial fibrillation: Secondary | ICD-10-CM | POA: Diagnosis not present

## 2023-03-16 DIAGNOSIS — I5043 Acute on chronic combined systolic (congestive) and diastolic (congestive) heart failure: Secondary | ICD-10-CM | POA: Diagnosis not present

## 2023-03-16 DIAGNOSIS — R531 Weakness: Secondary | ICD-10-CM | POA: Diagnosis not present

## 2023-03-16 LAB — BASIC METABOLIC PANEL
Anion gap: 10 (ref 5–15)
BUN: 15 mg/dL (ref 8–23)
CO2: 27 mmol/L (ref 22–32)
Calcium: 8 mg/dL — ABNORMAL LOW (ref 8.9–10.3)
Chloride: 99 mmol/L (ref 98–111)
Creatinine, Ser: 1.07 mg/dL (ref 0.61–1.24)
GFR, Estimated: >60 mL/min (ref >60)
Glucose, Bld: 111 mg/dL — ABNORMAL HIGH (ref 70–99)
Potassium: 3.7 mmol/L (ref 3.5–5.1)
Sodium: 136 mmol/L (ref 135–145)

## 2023-03-16 LAB — GLUCOSE, CAPILLARY
Glucose-Capillary: 107 mg/dL — ABNORMAL HIGH (ref 70–99)
Glucose-Capillary: 150 mg/dL — ABNORMAL HIGH (ref 70–99)
Glucose-Capillary: 119 mg/dL — ABNORMAL HIGH (ref 70–99)
Glucose-Capillary: 179 mg/dL — ABNORMAL HIGH (ref 70–99)

## 2023-03-16 LAB — COOXEMETRY PANEL
Carboxyhemoglobin: 1.9 % — ABNORMAL HIGH (ref 0.5–1.5)
Methemoglobin: <0.7 % (ref 0.0–1.5)
O2 Saturation: 61.8 %
Total hemoglobin: 12.9 g/dL (ref 12.0–16.0)

## 2023-03-16 MED ORDER — POTASSIUM CHLORIDE CRYS ER 20 MEQ PO TBCR: 40.0000 meq | EXTENDED_RELEASE_TABLET | Freq: Every day | ORAL | Status: DC

## 2023-03-16 MED ORDER — APIXABAN 5 MG PO TABS: 5.0000 mg | ORAL_TABLET | Freq: Two times a day (BID) | ORAL | Status: DC

## 2023-03-16 MED ORDER — MORPHINE SULFATE (CONCENTRATE) 10 MG/0.5ML PO SOLN: 2.5000 mg | ORAL | 0 refills | Status: DC | PRN

## 2023-03-16 MED ORDER — ALPRAZOLAM 0.25 MG PO TABS: 0.2500 mg | ORAL_TABLET | Freq: Two times a day (BID) | ORAL | 0 refills | Status: DC | PRN

## 2023-03-16 MED ORDER — FUROSEMIDE 40 MG PO TABS: 40.0000 mg | ORAL_TABLET | Freq: Every day | ORAL | Status: DC

## 2023-03-16 MED ORDER — AMIODARONE HCL 400 MG PO TABS: ORAL_TABLET | ORAL | 0 refills | Status: DC

## 2023-03-16 NOTE — Progress Notes (Signed)
Patient ID: Christian [name], male   DOB: [birth year], 82 y.o.   MRN: [id number]    Progress Note from the Palliative Medicine Team at [facility]   Patient Name: Christian [name]        Date: 03/16/2023 DOB: 1941-08-23  Age: 82 y.o. MRN#: [id number] Attending Physician: Zannie Cove, MD Primary Care Physician: [name], [place] Admit Date: 03/07/2023   Medical records reviewed, assessed patient  82 y.o. male  with past medical history of stroke with mild cognitive impairment, hoh, CHF, HTN, HLD, CAD, DM with neuropathy,  admitted on 03/07/2023 with bilateral PE and heart failure exacerbation. Required dobutamine for diuresis, now stopped.   Hospitalization complicated by patient's verbalized suicidal ideations, Psychiatry on board.  Primary Decision Maker PATIENT - surrogate decision maker is ex-spouse- [name], he has a sister that he has been estranged from for over 30 years, unable to be contacted- no other family- [name] is agreeable to be surrogate if needed   This NP assessed patient at the bedside as a follow up for palliative medicine needs and emotional support. No family at bedside.  Patient is alert and oriented and much more talkative today.   He tells me that he "feels stronger and wants to go home", he misses his 2 dogs.  Spoke to patient's ex-spouse and current  main support person/Lauraine Borski by telephone.  Several changes to decision regarding next steps in plan of care over the past 48 hrs.    Continued conversation regarding patient current medical and psychiatric conditions.    Plan at this time is  that once medically stable for discharge, to pursue short-term rehab at [facility].  I did verbalize my concern that patient is high risk for decompensation secondary to multiple co-morbidities.   Recommend OP Palliative services and transition to hospice when eligible.   Education offered on hospice benefit; philosophy and eligibility   Education offered today  regarding  the importance of continued conversation with family and their  medical providers regarding overall plan of care and treatment options,  ensuring decisions are within the context of the patients values and GOCs.  Questions and concerns addressed   Discussed with bedside RN    Time: 50  minutes  Detailed review of medical records ( labs, imaging, vital signs), medically appropriate exam ( MS, skin, resp)   discussed with treatment team, counseling and education to patient, family, staff, documenting clinical information, medication management, coordination of care    [name] NP  Palliative Medicine Team Team Phone # [phone] Pager [phone]

## 2023-03-16 NOTE — Progress Notes (Signed)
Occupational Therapy Treatment Patient Details Name: Christian [name] MRN: [id number] DOB: [birth year] Today's Date: 03/16/2023   History of present illness 82 y.o. male adm 5/18 with SOB x1 month. Pt with multiple bil segmental PE, started on heparin and CHF exacerbation. PMHx: R PCA CVA, HFrEF, CAD, HTN, GERD, Hernia of abdominal cavity, HLD, Neuropathy, DM II.   OT comments  Pt with slow progression towards goals this session, self-limiting. Pt needing min guard A for toilet transfer and standing grooming task, min guard A for bed mobility, and min guard A for transfers with rollator. Pt needing min cues for using brakes on rollator, leans posteriorly with static standing. Pt presenting with impairments listed below, will follow acutely. Patient will benefit from continued inpatient follow up therapy, <3 hours/day to maximize safety and independence with ADLs/functional mobility.    Recommendations for follow up therapy are one component of a multi-disciplinary discharge planning process, led by the attending physician.  Recommendations may be updated based on patient status, additional functional criteria and insurance authorization.    Assistance Recommended at Discharge Frequent or constant Supervision/Assistance  Patient can return home with the following  A little help with walking and/or transfers;A little help with bathing/dressing/bathroom;Assistance with cooking/housework;Direct supervision/assist for medications management;Direct supervision/assist for financial management;Assist for transportation;Help with stairs or ramp for entrance   Equipment Recommendations  Other (comment) (defer)    Recommendations for Other Services PT consult    Precautions / Restrictions Precautions Precautions: Fall Precaution Comments: watch SpO2 Restrictions Weight Bearing Restrictions: No       Mobility Bed Mobility Overal bed mobility: Needs Assistance Bed Mobility: Supine to Sit      Supine to sit: Min guard Sit to supine: Min guard        Transfers Overall transfer level: Needs assistance Equipment used: Rollator (4 wheels) Transfers: Sit to/from Stand Sit to Stand: Min guard           General transfer comment: cues for using brakes     Balance Overall balance assessment: Needs assistance Sitting-balance support: No upper extremity supported Sitting balance-Leahy Scale: Fair Sitting balance - Comments: posterior bias, but no LOB   Standing balance support: Bilateral upper extremity supported Standing balance-Leahy Scale: Poor Standing balance comment: RW for gait                           ADL either performed or assessed with clinical judgement   ADL Overall ADL's : Needs assistance/impaired     Grooming: Oral care;Standing;Min guard Grooming Details (indicate cue type and reason): standing at sink                 Toilet Transfer: Diplomatic Services operational officer (4 wheels)   Toileting- Architect and Hygiene: Min guard;Sit to/from stand       Functional mobility during ADLs: Diplomatic Services operational officer (4 wheels)      Extremity/Trunk Assessment Upper Extremity Assessment Upper Extremity Assessment: Generalized weakness   Lower Extremity Assessment Lower Extremity Assessment: Defer to PT evaluation        Vision   Vision Assessment?: No apparent visual deficits   Perception Perception Perception: Not tested   Praxis Praxis Praxis: Not tested    Cognition Arousal/Alertness: Awake/alert Behavior During Therapy: Agitated Overall Cognitive Status: Impaired/Different from baseline Area of Impairment: Orientation, Memory, Following commands, Problem solving, Safety/judgement                 Orientation Level: Time, Situation   Memory:  Decreased short-term memory Following Commands: Follows one step commands with increased time (with repetition) Safety/Judgement: Decreased awareness of safety, Decreased awareness  of deficits Awareness: Intellectual Problem Solving: Slow processing, Decreased initiation, Difficulty sequencing, Requires verbal cues General Comments: multimodal cues to follow commands        Exercises      Shoulder Instructions       General Comments VSS on 2L O2    Pertinent Vitals/ Pain       Pain Assessment Pain Assessment: No/denies pain  Home Living                                          Prior Functioning/Environment              Frequency  Min 1X/week        Progress Toward Goals  OT Goals(current goals can now be found in the care plan section)  Progress towards OT goals: Progressing toward goals  Acute Rehab OT Goals Patient Stated Goal: none stated OT Goal Formulation: With patient Time For Goal Achievement: 03/23/23 Potential to Achieve Goals: Good ADL Goals Pt Will Perform Upper Body Dressing: with modified independence;sitting Pt Will Perform Lower Body Dressing: with modified independence;sitting/lateral leans;sit to/from stand Pt Will Transfer to Toilet: with modified independence;ambulating;regular height toilet Additional ADL Goal #1: pt will follow 3 step command in prep for ADLs.  Plan Discharge plan remains appropriate    Co-evaluation                 AM-PAC OT "6 Clicks" Daily Activity     Outcome Measure   Help from another person eating meals?: A Little Help from another person taking care of personal grooming?: A Little Help from another person toileting, which includes using toliet, bedpan, or urinal?: A Lot Help from another person bathing (including washing, rinsing, drying)?: A Lot Help from another person to put on and taking off regular upper body clothing?: A Lot Help from another person to put on and taking off regular lower body clothing?: Total 6 Click Score: 13    End of Session Equipment Utilized During Treatment: Gait belt;Rollator (4 wheels);Oxygen  OT Visit Diagnosis:  Unsteadiness on feet (R26.81);Other abnormalities of gait and mobility (R26.89);Muscle weakness (generalized) (M62.81)   Activity Tolerance Patient tolerated treatment well   Patient Left in bed;with call bell/phone within reach;with bed alarm set   Nurse Communication Mobility status        Time: 3086-5784 OT Time Calculation (min): 22 min  Charges: OT General Charges $OT Visit: 1 Visit OT Treatments $Self Care/Home Management : 8-22 mins  Christian [name], OTD, OTR/L [employer] Preferred Acute Rehab [phone]   Christian [name] 03/16/2023, 9:40 AM

## 2023-03-16 NOTE — TOC Progression Note (Addendum)
Transition of Care [facility]) - Progression Note    Patient Details  Name: Christian [name] MRN: [id number] Date of Birth: 1941/08/31  Transition of Care [facility]) CM/SW Contact  Elliot Cousin, RN Phone Number: [phone] 03/16/2023, 1:07 PM  Clinical Narrative:   TOC CM received update from PT recommendations, PT recommending HHPT, with supervision at home. Pt's lives alone, his ex-wife would usually assist with transportation to appt, setting up medications, and helping him with meals. But she is unable to provide 24 hour care. She is currently traveling and will be out of town until 6/62024. Pt was active with [facility] RN with [facility]. Attending updated.     Expected Discharge Plan: Skilled Nursing Facility Barriers to Discharge: Requiring sitter/restraints  Expected Discharge Plan and Services   Discharge Planning Services: CM Consult Post Acute Care Choice: Hospice, Skilled Nursing Facility Living arrangements for the past 2 months: Single Family Home                 DME Arranged: 3-N-1 DME Agency: [employer] Date DME Agency Contacted: 03/11/23 Time DME Agency Contacted: [phone] Representative spoke with at DME Agency: Shaune Leeks HH Arranged: PT, RN [facility] Agency: [facility] Date [facility] Agency Contacted: 03/10/23 Time HH Agency Contacted: [phone] Representative spoke with at [facility] Agency: Lorenza Chick   Social Determinants of Health (SDOH) Interventions SDOH Screenings   Food Insecurity: No Food Insecurity (03/10/2023)  Housing: Low Risk  (03/10/2023)  Transportation Needs: No Transportation Needs (03/10/2023)  Utilities: Not At Risk (03/10/2023)  Alcohol Screen: Low Risk  (10/12/2019)  Depression (PHQ2-9): Medium Risk (04/29/2022)  Financial Resource Strain: Low Risk  (03/10/2023)  Tobacco Use: Low Risk  (03/07/2023)    Readmission Risk Interventions     No data to display

## 2023-03-16 NOTE — Progress Notes (Signed)
Physical Therapy Treatment Patient Details Name: Christian [name] MRN: [id number] DOB: 01/02/1941 Today's Date: 03/16/2023   History of Present Illness 82 y.o. male adm 5/18 with SOB x1 month. Pt with multiple bil segmental PE, started on heparin and CHF exacerbation. PMHx: R PCA CVA, HFrEF, CAD, HTN, GERD, Hernia of abdominal cavity, HLD, Neuropathy, DM II.    PT Comments    Pt pleasant stating he prefers to get up first thing in the morning but educated for need to mobilize throughout the day as he would at home taking care of his dogs. Pt agreeable to gait and remains intent on return home. Pt able to move at supervision level and needs cues to maximize functional activity. D/C plan remains appropriate and encouraged increased time OOB acutely.     Recommendations for follow up therapy are one component of a multi-disciplinary discharge planning process, led by the attending physician.  Recommendations may be updated based on patient status, additional functional criteria and insurance authorization.  Follow Up Recommendations       Assistance Recommended at Discharge Intermittent Supervision/Assistance  Patient can return home with the following A little help with walking and/or transfers;A little help with bathing/dressing/bathroom;Assistance with cooking/housework;Direct supervision/assist for medications management;Direct supervision/assist for financial management;Assist for transportation;Help with stairs or ramp for entrance   Equipment Recommendations  None recommended by PT    Recommendations for Other Services       Precautions / Restrictions Precautions Precautions: Fall Precaution Comments: watch SpO2 Restrictions Weight Bearing Restrictions: No     Mobility  Bed Mobility Overal bed mobility: Needs Assistance Bed Mobility: Supine to Sit     Supine to sit: Supervision     General bed mobility comments: supervision with increased time to complete transition to  sitting with use of rail and HOB 10 degrees    Transfers Overall transfer level: Needs assistance   Transfers: Sit to/from Stand Sit to Stand: Supervision           General transfer comment: cues for hand placement to push off bed not pull on rollator. Pt then performed 10 sTS at chair with and without armrests    Ambulation/Gait Ambulation/Gait assistance: Supervision Gait Distance (Feet): 225 Feet Assistive device: Rollator (4 wheels) Gait Pattern/deviations: Step-through pattern, Decreased stride length, Trunk flexed   Gait velocity interpretation: 1.31 - 2.62 ft/sec, indicative of limited community ambulator   General Gait Details: cues for posture, proximity to RW and direction. SPO2 88-94% on RA, HR 104 with activity   Stairs             Wheelchair Mobility    Modified Rankin (Stroke Patients Only)       Balance Overall balance assessment: Needs assistance   Sitting balance-Leahy Scale: Good Sitting balance - Comments: EOb without support   Standing balance support: Bilateral upper extremity supported Standing balance-Leahy Scale: Poor Standing balance comment: rollator for standing                            Cognition Arousal/Alertness: Awake/alert Behavior During Therapy: Flat affect Overall Cognitive Status: Impaired/Different from baseline Area of Impairment: Orientation, Memory, Following commands, Problem solving, Safety/judgement                 Orientation Level: Time   Memory: Decreased short-term memory Following Commands: Follows one step commands consistently Safety/Judgement: Decreased awareness of deficits   Problem Solving: Slow processing  Exercises General Exercises - Lower Extremity Hip Flexion/Marching: AROM, Both, 20 reps, Standing, Strengthening    General Comments General comments (skin integrity, edema, etc.): VSS on 2L O2      Pertinent Vitals/Pain Pain Assessment Pain Assessment:  No/denies pain    Home Living                          Prior Function            PT Goals (current goals can now be found in the care plan section) Progress towards PT goals: Progressing toward goals    Frequency    Min 1X/week      PT Plan Current plan remains appropriate    Co-evaluation              AM-PAC PT "6 Clicks" Mobility   Outcome Measure  Help needed turning from your back to your side while in a flat bed without using bedrails?: None Help needed moving from lying on your back to sitting on the side of a flat bed without using bedrails?: None Help needed moving to and from a bed to a chair (including a wheelchair)?: A Little Help needed standing up from a chair using your arms (e.g., wheelchair or bedside chair)?: A Little Help needed to walk in hospital room?: A Little Help needed climbing 3-5 steps with a railing? : A Little 6 Click Score: 20    End of Session   Activity Tolerance: Patient tolerated treatment well Patient left: in chair;with call bell/phone within reach;with chair alarm set;with family/visitor present Nurse Communication: Mobility status PT Visit Diagnosis: Other abnormalities of gait and mobility (R26.89)     Time: 1610-9604 PT Time Calculation (min) (ACUTE ONLY): 23 min  Charges:  $Gait Training: 8-22 mins $Therapeutic Activity: 8-22 mins                     [name], PT Acute Rehabilitation Services Office: [phone]    Enedina Finner [name] 03/16/2023, 11:40 AM

## 2023-03-16 NOTE — Discharge Summary (Signed)
Physician Discharge Summary  Christian [name] ZOX:[id number] DOB: Sep 01, 1941 DOA: 03/07/2023  PCP: [name], PA  Admit date: 03/07/2023 Discharge date: 03/17/2023  Time spent: 35 minutes  Recommendations for Outpatient Follow-up:  SNF with palliative care with recommendations to transition to hospice when he declines further Routine catheter care, to be changed in 2 weeks   Discharge Diagnoses:  Principal Problem:   Acute on chronic combined systolic and diastolic CHF (congestive heart failure) (HCC) Active Problems:   Acute pulmonary embolism without acute cor pulmonale (HCC)   Paroxysmal atrial fibrillation (HCC)   Hyperlipidemia   Type 2 diabetes mellitus with complication, without long-term current use of insulin (HCC)   Coronary artery disease involving native coronary artery of native heart without angina pectoris   Benign essential HTN   Cerebrovascular disease   Mood disorder (HCC)   Liver lesion   Acute urinary retention   Discharge Condition: Improved  Diet recommendation: Low-sodium, diabetic  Filed Weights   03/15/23 0500 03/16/23 0500 03/17/23 0427  Weight: 69.1 kg 66.5 kg 66.2 kg    History of present illness:  82/M with history of chronic systolic CHF with recovered EF, NICM, CVA, hypertension, type 2 diabetes mellitus,?  Early dementia presented to the ED with dyspnea on exertion X several months worsened over 2 days. -Workup in the ED noted bilateral pulmonary emboli and CHF -Admitted, 5/18 started on IV heparin -5/19, diuretics started, echo noted EF down to<20% -5/20 advanced heart failure team consulted, transferred to SDU for dobutamine -5/21 tachyarrhythmia started on amiodarone palliative care consulted -5/22, reported suicidal ideation, psych consulted, dobutamine discontinued -5/23, palliative meeting again, patient and ex-wife decided to pursue SNF for short-term rehab with palliative care, cleared by psychiatry for rehab  Hospital Course:    Acute on chronic combined systolic and diastolic CHF Severe biventricular failure- -EF previously <20% in 2018, improved to 60-65% in 07/2018  -Repeat echo this admission with <20%, moderately reduced RV, moderate aortic stenosis  -LHC 2018 with nonobstructive CAD -sp inotropic support with dobutamine, discontinued 5/22 -GDMT limited by hypotension  -Overall prognosis is felt to be poor and not a candidate for advanced therapies/TAVR, palliative care following -avoid SGLT2i with urinary retention/Foley -Now stable on Lasix 40 Mg daily -Discharge planning, apparently did not meet criteria for residential hospice, plan for SNF with palliative care and transition to hospice as he declines further   Acute pulmonary embolism without acute cor pulmonale CTA chest on admission showed bilateral PE. - Continue Eliquis   Major depression, suicidal ideation -Psych consulted, also has cognitive deficits which interfere with evaluation and care   Goals of care: Frail elderly male with severe cardiomyopathy and cognitive deficits with ongoing FTT, palliative consulted, now DNR, conservative management, continue current care   Paroxysmal atrial fibrillation (HCC) -new onset.  TSH normal. Echo without valvular disease. CHA2DS2-Vasc 7  -Was on IV amiodarone, now converted to sinus rhythm, changed to oral amiodarone 400 Mg twice daily for 1 week followed by 200 Mg twice daily, continue Eliquis   Acute urinary retention Overnight 5/20, requiring repeated I/O cath, >1L return both times. -Foley catheter placed 5/21, will DC to SNF with Foley, will need voiding trial once more ambulatory   Liver lesion, liver cirrhosis CTA on admission showed radiographic indications of cirrhosis and known right heart failure as well as 2.3 cm indeterminate lesion. - Consider MRI abdomen if he improves functionally down the road,   Mood disorder (HCC) - Continue Lexapro   Cerebrovascular disease See above  Benign essential HTN Blood pressure low - Meds as above   CAD PTA Aspirin and Plavix stopped here.  Now on apixaban - Continue Zetia   Type 2 diabetes mellitus with complication, without long-term current use of insulin (HCC) A1c 7.6% last Jan -stable, SSI - Hold metformin   Hyperlipidemia - Continue Zetia  Code Status: DNR  Discharge Exam: Vitals:   03/17/23 0720 03/17/23 0907  BP: 97/71   Pulse: 79 92  Resp: 20 18  Temp: 98.6 F (37 C)   SpO2: 92% 92%   General exam: Elderly chronically ill male laying in bed, hard of hearing with mild cognitive deficits CVS: S1-S2, regular rhythm Lungs: Clear bilaterally Abdomen: Soft, nontender, bowel sounds present Extremities: No edema , right arm PICC line noted Psychiatry: Poor insight and judgment, cognitive deficits  Discharge Instructions    Allergies as of 03/17/2023       Reactions   Amoxicillin Hives   Cephalexin Itching   Lipitor [atorvastatin] Other (See Comments)   Damage to calf muscles   Statins Other (See Comments)   Muscle pain   Vytorin [ezetimibe-simvastatin] Other (See Comments)   Damage to calf muscles        Medication List     STOP taking these medications    carvedilol 3.125 MG tablet Commonly known as: COREG   clopidogrel 75 MG tablet Commonly known as: PLAVIX   [employer] Gas Relief Extra Strength 125 MG chewable tablet Generic drug: simethicone   metFORMIN 500 MG tablet Commonly known as: GLUCOPHAGE       TAKE these medications    acetaminophen 325 MG tablet Commonly known as: TYLENOL Take 2 tablets (650 mg total) by mouth every 4 (four) hours as needed for mild pain (or temp > 37.5 C (99.5 F)).   albuterol 108 (90 Base) MCG/ACT inhaler Commonly known as: VENTOLIN HFA Inhale 2 puffs into the lungs every 6 (six) hours as needed for wheezing or shortness of breath.   ALPRAZolam 0.25 MG tablet Commonly known as: XANAX Take 1 tablet (0.25 mg total) by mouth 2 (two) times daily  as needed for anxiety.   amiodarone 400 MG tablet Commonly known as: PACERONE Take amiodarone 400 Mg twice daily for 1 week followed by 200 Mg twice daily   apixaban 5 MG Tabs tablet Commonly known as: ELIQUIS Take 1 tablet (5 mg total) by mouth 2 (two) times daily.   cholecalciferol 1000 units tablet Commonly known as: VITAMIN D Take 1 tablet (1,000 Units total) by mouth daily.   escitalopram 20 MG tablet Commonly known as: Lexapro Take 1 tablet (20 mg total) by mouth daily.   esomeprazole 40 MG capsule Commonly known as: NEXIUM TAKE 1 CAPSULE BY MOUTH DAILY AS NEEDED FOR HEARTBURN   ezetimibe 10 MG tablet Commonly known as: ZETIA Take 1 tablet (10 mg total) by mouth daily.   furosemide 40 MG tablet Commonly known as: LASIX Take 1 tablet (40 mg total) by mouth daily.   hydrOXYzine 10 MG tablet Commonly known as: ATARAX Take 1 tablet (10 mg total) by mouth 2 (two) times daily as needed for itching or anxiety.   morphine CONCENTRATE 10 MG/0.5ML Soln concentrated solution Take 0.13 mLs (2.6 mg total) by mouth every 4 (four) hours as needed (shortness of breath).   olopatadine 0.1 % ophthalmic solution Commonly known as: PATANOL INSTILL 1 DROP INTO BOTH EYS DAILY AS NEEDED FOR ALLERGIES   ondansetron 4 MG disintegrating tablet Commonly known as: ZOFRAN-ODT TAKE 2 TABLETS  BY MOUTH AS NEEDED   potassium chloride SA 20 MEQ tablet Commonly known as: KLOR-CON M Take 2 tablets (40 mEq total) by mouth daily.   rosuvastatin 40 MG tablet Commonly known as: CRESTOR Take 1 tablet (40 mg total) by mouth daily.   traZODone 50 MG tablet Commonly known as: DESYREL Take 0.5-1 tablets (25-50 mg total) by mouth at bedtime as needed for sleep.   triamcinolone cream 0.1 % Commonly known as: KENALOG 1 application Externally once a day, mix w/ itch relief lotion for 30 days               Durable Medical Equipment  (From admission, onward)           Start     Ordered    03/11/23 1425  For home use only DME 4 wheeled rolling walker with seat  Once       Question Answer Comment  Patient needs a walker to treat with the following condition Heart failure (HCC)   Patient needs a walker to treat with the following condition Pulmonary embolism (HCC)      03/11/23 1425           Allergies  Allergen Reactions   Amoxicillin Hives   Cephalexin Itching   Lipitor [Atorvastatin] Other (See Comments)    Damage to calf muscles   Statins Other (See Comments)    Muscle pain    Vytorin [Ezetimibe-Simvastatin] Other (See Comments)    Damage to calf muscles    Follow-up Information     [facility] Follow up on 03/25/2023.   Specialty: Cardiology Why: Advanced Heart Failure Clinic 10:30 AM Entrance C, Free Valet Parking Please bring all meds to appointment Contact information: [address] [id number] mc [place] [address] [phone]        [facility] Follow up.   Specialty: PALLIATIVE CARE Why: Outpatient Palliative Services Contact information: [address] [facility] [address] [phone]                 The results of significant diagnostics from this hospitalization (including imaging, microbiology, ancillary and laboratory) are listed below for reference.    Significant Diagnostic Studies: [place] EKG SITE RITE  Result Date: 03/09/2023 If Site Rite image not attached, placement could not be confirmed due to current cardiac rhythm.  ECHOCARDIOGRAM COMPLETE  Result Date: 03/07/2023    ECHOCARDIOGRAM REPORT   Patient Name:   Christian [name] Date of Exam: 03/07/2023 Medical Rec #:  [id number]     Height:       66.0 in Accession #:    [id number]    Weight:       156.6 lb Date of Birth:  1941/03/29     BSA:          1.802 m Patient Age:    81 years      BP:           102/87 mmHg Patient Gender: M             HR:           87 bpm. Exam  Location:  Inpatient Procedure: 2D Echo, Cardiac Doppler and Color Doppler  Results communicated to Dr Maryfrances Bunnell at 18:36 on 03/07/23. Indications:    dilated cardiomyopathy  History:        Patient has prior history of Echocardiogram examinations, most  recent 02/26/2020. CHF, CAD; Risk Factors:Diabetes and                 Dyslipidemia.  Sonographer:    Delcie Roch RDCS Referring Phys: [id number] Vinnie Level SMITH IMPRESSIONS  1. Left ventricular ejection fraction, by estimation, is <20%. The left ventricle has severely decreased function. The left ventricle demonstrates global hypokinesis. The left ventricular internal cavity size was mildly dilated. There is mild left ventricular hypertrophy. Left ventricular diastolic parameters are indeterminate.  2. Right ventricular systolic function is moderately reduced. The right ventricular size is normal. Tricuspid regurgitation signal is inadequate for assessing PA pressure.  3. Left atrial size was mildly dilated.  4. The mitral valve is degenerative. Mild to moderate mitral valve regurgitation. Moderate mitral annular calcification.  5. The aortic valve is tricuspid. There is moderate calcification of the aortic valve. Aortic valve regurgitation is trivial. MG across AV only , but visually appears stenotic and AVA 1.0cm^2 and DI (0.26) suggest moderate AS. Very low SV index (14  cc/m^2), suspect low flow low gradient moderate AS  6. Aortic dilatation noted. There is dilatation of the ascending aorta, measuring 41 mm.  7. The inferior vena cava is dilated in size with <50% respiratory variability, suggesting right atrial pressure of 15 mmHg. FINDINGS  Left Ventricle: Left ventricular ejection fraction, by estimation, is <20%. The left ventricle has severely decreased function. The left ventricle demonstrates global hypokinesis. The left ventricular internal cavity size was mildly dilated. There is mild left ventricular hypertrophy. Left ventricular  diastolic parameters are indeterminate. Right Ventricle: The right ventricular size is normal. Right vetricular wall thickness was not well visualized. Right ventricular systolic function is moderately reduced. Tricuspid regurgitation signal is inadequate for assessing PA pressure. Left Atrium: Left atrial size was mildly dilated. Right Atrium: Right atrial size was normal in size. Pericardium: There is no evidence of pericardial effusion. Mitral Valve: The mitral valve is degenerative in appearance. Moderate mitral annular calcification. Mild to moderate mitral valve regurgitation. Tricuspid Valve: The tricuspid valve is normal in structure. Tricuspid valve regurgitation is trivial. Aortic Valve: The aortic valve is tricuspid. There is moderate calcification of the aortic valve. Aortic valve regurgitation is trivial. Aortic valve mean gradient measures 4.5 mmHg. Aortic valve peak gradient measures 8.3 mmHg. Aortic valve area, by VTI  measures 1.19 cm. Pulmonic Valve: The pulmonic valve was not well visualized. Pulmonic valve regurgitation is trivial. Aorta: Aortic dilatation noted and the aortic root is normal in size and structure. There is dilatation of the ascending aorta, measuring 41 mm. Venous: The inferior vena cava is dilated in size with less than 50% respiratory variability, suggesting right atrial pressure of 15 mmHg. IAS/Shunts: The interatrial septum was not well visualized.  LEFT VENTRICLE PLAX 2D LVIDd:         5.80 cm LVIDs:         5.20 cm LV PW:         1.20 cm LV IVS:        1.20 cm LVOT diam:     2.30 cm LV SV:         30 LV SV Index:   16 LVOT Area:     4.15 cm  RIGHT VENTRICLE            IVC RV Basal diam:  2.90 cm    IVC diam: 2.20 cm RV S prime:     6.31 cm/s TAPSE (M-mode): 1.2 cm LEFT ATRIUM  Index        RIGHT ATRIUM           Index LA diam:        4.10 cm 2.27 cm/m   RA Area:     19.30 cm LA Vol (A2C):   63.9 ml 35.45 ml/m  RA Volume:   53.90 ml  29.91 ml/m LA Vol (A4C):    62.5 ml 34.68 ml/m LA Biplane Vol: 64.5 ml 35.79 ml/m  AORTIC VALVE AV Area (Vmax):    1.37 cm AV Area (Vmean):   1.26 cm AV Area (VTI):     1.19 cm AV Vmax:           144.00 cm/s AV Vmean:          96.100 cm/s AV VTI:            0.250 m AV Peak Grad:      8.3 mmHg AV Mean Grad:      4.5 mmHg LVOT Vmax:         47.57 cm/s LVOT Vmean:        29.133 cm/s LVOT VTI:          0.071 m LVOT/AV VTI ratio: 0.29  AORTA Ao Root diam: 3.60 cm Ao Asc diam:  4.10 cm MR Peak grad:    53.6 mmHg MR Mean grad:    34.0 mmHg    SHUNTS MR Vmax:         366.00 cm/s  Systemic VTI:  0.07 m MR Vmean:        276.0 cm/s   Systemic Diam: 2.30 cm MR PISA:         1.01 cm MR PISA Eff ROA: 11 mm MR PISA Radius:  0.40 cm Epifanio Lesches MD Electronically signed by Epifanio Lesches MD Signature Date/Time: 03/07/2023/6:37:46 PM    Final    CT Angio Chest PE W and/or Wo Contrast  Result Date: 03/07/2023 CLINICAL DATA:  Shortness of breath.  Elevated D-dimer. EXAM: CT ANGIOGRAPHY CHEST WITH CONTRAST TECHNIQUE: Multidetector CT imaging of the chest was performed using the standard protocol during bolus administration of intravenous contrast. Multiplanar CT image reconstructions and MIPs were obtained to evaluate the vascular anatomy. RADIATION DOSE REDUCTION: This exam was performed according to the departmental dose-optimization program which includes automated exposure control, adjustment of the mA and/or kV according to patient size and/or use of iterative reconstruction technique. CONTRAST:  75mL OMNIPAQUE IOHEXOL 350 MG/ML SOLN COMPARISON:  11/15/2016 FINDINGS: Cardiovascular: Pulmonary emboli are seen in the central right upper lobe pulmonary artery and segmental branches, an anterior segmental branch in the right lower lobe pulmonary artery, and posterior segmental branches of the left lower lobe pulmonary artery. No evidence of elevated RV/LV ratio. No evidence of thoracic aortic dissection or aneurysm. Contrast reflux into  IVC and hepatic veins is consistent with right heart insufficiency. Mediastinum/Nodes: No masses or pathologically enlarged lymph nodes identified. Lungs/Pleura: Small pleural effusions are seen bilaterally with mild dependent atelectasis. No evidence of pulmonary consolidation or mass. Upper abdomen: Hepatic cirrhosis noted. 2.3 cm low-attenuation lesion is seen in the lateral segment of the left hepatic lobe, which cannot be characterized on this study. Musculoskeletal: No suspicious bone lesions identified. Review of the MIP images confirms the above findings. IMPRESSION: Bilateral pulmonary embolism, as described above. No CT evidence of right heart strain. Small bilateral pleural effusions and mild dependent atelectasis. Hepatic cirrhosis. Findings consistent with right heart insufficiency. 2.3 cm low-attenuation lesion in left hepatic lobe, which  cannot be characterized on this study. Recommend abdomen MRI without and with contrast for further characterization. Critical Value/emergent results were called by telephone at the time of interpretation on 03/07/2023 at 12:53 pm to provider Ernie Avena , who verbally acknowledged these results. Electronically Signed   By: Danae Orleans M.D.   On: 03/07/2023 12:57   DG Chest Portable 1 View  Result Date: 03/07/2023 CLINICAL DATA:  Shortness of breath EXAM: PORTABLE CHEST 1 VIEW COMPARISON:  02/24/2020 FINDINGS: Cardiomegaly, vascular congestion. Mild perihilar and infrahilar airspace opacities, likely edema. No effusions or acute bony abnormality. IMPRESSION: Mild CHF. Electronically Signed   By: Charlett Nose M.D.   On: 03/07/2023 10:13    Microbiology: No results found for this or any previous visit (from the past 240 hour(s)).    Labs: Basic Metabolic Panel: Recent Labs  Lab 03/11/23 0352 03/12/23 0612 03/13/23 0636 03/14/23 0605 03/15/23 0516 03/16/23 0416  NA 132* 138 137 136 135 136  K 3.9 3.7 3.7 3.6 3.7 3.7  CL 93* 95* 92* 96* 96* 99  CO2  29 32 34* 29 29 27   GLUCOSE 258* 96 123* 145* 122* 111*  BUN 20 17 18 18 17 15   CREATININE 1.19 1.34* 1.34* 1.19 1.23 1.07  CALCIUM 8.1* 8.4* 8.0* 8.0* 7.8* 8.0*  MG 1.8 1.7 2.1 2.1 2.0  --    Liver Function Tests: No results for input(s): "AST", "ALT", "ALKPHOS", "BILITOT", "PROT", "ALBUMIN" in the last 168 hours. No results for input(s): "LIPASE", "AMYLASE" in the last 168 hours. No results for input(s): "AMMONIA" in the last 168 hours. CBC: Recent Labs  Lab 03/11/23 0352 03/12/23 0612 03/13/23 0636 03/14/23 0605 03/15/23 0516  WBC 6.7 8.0 7.4 8.7 7.7  HGB 11.5* 12.5* 12.5* 12.1* 12.4*  HCT 36.7* 40.1 40.3 39.3 40.4  MCV 91.3 89.7 91.2 92.3 91.4  PLT 162 182 172 173 175   Cardiac Enzymes: No results for input(s): "CKTOTAL", "CKMB", "CKMBINDEX", "TROPONINI" in the last 168 hours. BNP: BNP (last 3 results) Recent Labs    03/07/23 0952  BNP 2,668.0*    ProBNP (last 3 results) No results for input(s): "PROBNP" in the last 8760 hours.  CBG: Recent Labs  Lab 03/16/23 0641 03/16/23 1112 03/16/23 1618 03/16/23 2112 03/17/23 0617  GLUCAP 107* 150* 119* 179* 118*       Signed:  Zannie Cove MD.  [facility] 03/17/2023, 10:04 AM

## 2023-03-17 LAB — GLUCOSE, CAPILLARY
Glucose-Capillary: 118 mg/dL — ABNORMAL HIGH (ref 70–99)
Glucose-Capillary: 147 mg/dL — ABNORMAL HIGH (ref 70–99)
Glucose-Capillary: 135 mg/dL — ABNORMAL HIGH (ref 70–99)
Glucose-Capillary: 178 mg/dL — ABNORMAL HIGH (ref 70–99)
Glucose-Capillary: 186 mg/dL — ABNORMAL HIGH (ref 70–99)

## 2023-03-17 NOTE — Progress Notes (Signed)
Patient seen and examined, no changes from my discharge summary yesterday -TOC following, trying to see if SNF can be an option for him -Will be at risk of quick readmission if he goes home alone  Zannie Cove MD

## 2023-03-17 NOTE — Progress Notes (Signed)
Physical Therapy Treatment Patient Details Name: Christian [name] MRN: [id number] DOB: 1940-12-25 Today's Date: 03/17/2023   History of Present Illness 82 y.o. male adm 5/18 with SOB x1 month. Pt with multiple bil segmental PE, started on heparin and CHF exacerbation. PMHx: R PCA CVA, HFrEF, CAD, HTN, GERD, Hernia of abdominal cavity, HLD, Neuropathy, DM II.    PT Comments    Pt pleasant and very willing to mobilize although pt does not recall session from prior date despite cues that friend Christian [name] walked with [place] during session. Pt not oriented to day or month and continues to state desire to go home. Pt reports friend Christian [name] can assist and check on him while ex-wife is out of town. PT would greatly benefit from ALF long-term and short therm pt will benefit from continued inpatient follow up therapy, <3 hours/day. Pt educated for progressive mobility, transfers and gait. SpO2 92-96% on 1L with continued difficulty in pleth accuracy. HR 92     Recommendations for follow up therapy are one component of a multi-disciplinary discharge planning process, led by the attending physician.  Recommendations may be updated based on patient status, additional functional criteria and insurance authorization.  Follow Up Recommendations  Can patient physically be transported by private vehicle: Yes    Assistance Recommended at Discharge Intermittent Supervision/Assistance  Patient can return home with the following A little help with walking and/or transfers;A little help with bathing/dressing/bathroom;Assistance with cooking/housework;Direct supervision/assist for medications management;Direct supervision/assist for financial management;Assist for transportation;Help with stairs or ramp for entrance   Equipment Recommendations  None recommended by PT    Recommendations for Other Services       Precautions / Restrictions Precautions Precautions: Fall Precaution Comments: watch SpO2 Restrictions Weight  Bearing Restrictions: No     Mobility  Bed Mobility Overal bed mobility: Needs Assistance Bed Mobility: Supine to Sit     Supine to sit: Supervision     General bed mobility comments: pt able to transition to sitting with bed flat and assist only for lines    Transfers Overall transfer level: Needs assistance   Transfers: Sit to/from Stand Sit to Stand: Supervision           General transfer comment: cues for hand placement to push off bed not pull on rollator. Pt then performed 15 sTS at chair with and without armrests without need for cues    Ambulation/Gait Ambulation/Gait assistance: Supervision Gait Distance (Feet): 400 Feet Assistive device: Rollator (4 wheels) Gait Pattern/deviations: Step-through pattern, Decreased stride length, Trunk flexed   Gait velocity interpretation: 1.31 - 2.62 ft/sec, indicative of limited community ambulator   General Gait Details: cues for proximity to RW x 2, cues for direction in unfamiliar environment, pt able to locate room with increased time recalling room number. HR 92, 96% on 1L   Stairs             Wheelchair Mobility    Modified Rankin (Stroke Patients Only)       Balance Overall balance assessment: Needs assistance Sitting-balance support: No upper extremity supported Sitting balance-Leahy Scale: Good Sitting balance - Comments: EOb without support   Standing balance support: Bilateral upper extremity supported Standing balance-Leahy Scale: Fair Standing balance comment: pt can static stand without support, rollator for gait                            Cognition Arousal/Alertness: Awake/alert Behavior During Therapy: Flat affect Overall Cognitive Status: History of  cognitive impairments - at baseline Area of Impairment: Orientation, Memory, Following commands, Problem solving, Safety/judgement                 Orientation Level: Time   Memory: Decreased short-term memory Following  Commands: Follows one step commands consistently Safety/Judgement: Decreased awareness of deficits   Problem Solving: Slow processing          Exercises General Exercises - Lower Extremity Long Arc Quad: AROM, Both, 20 reps, Seated, Strengthening    General Comments        Pertinent Vitals/Pain Pain Assessment Pain Assessment: No/denies pain    Home Living                          Prior Function            PT Goals (current goals can now be found in the care plan section) Progress towards PT goals: Progressing toward goals    Frequency    Min 1X/week      PT Plan Discharge plan needs to be updated    Co-evaluation              AM-PAC PT "6 Clicks" Mobility   Outcome Measure  Help needed turning from your back to your side while in a flat bed without using bedrails?: None Help needed moving from lying on your back to sitting on the side of a flat bed without using bedrails?: None Help needed moving to and from a bed to a chair (including a wheelchair)?: A Little Help needed standing up from a chair using your arms (e.g., wheelchair or bedside chair)?: A Little Help needed to walk in hospital room?: A Little Help needed climbing 3-5 steps with a railing? : A Little 6 Click Score: 20    End of Session   Activity Tolerance: Patient tolerated treatment well Patient left: in chair;with call bell/phone within reach;with chair alarm set Nurse Communication: Mobility status PT Visit Diagnosis: Other abnormalities of gait and mobility (R26.89)     Time: 1610-9604 PT Time Calculation (min) (ACUTE ONLY): 32 min  Charges:  $Gait Training: 8-22 mins $Therapeutic Activity: 8-22 mins                     [name], PT Acute Rehabilitation Services Office: [phone]    Enedina Finner Miley Lindon 03/17/2023, 9:10 AM

## 2023-03-17 NOTE — NC FL2 (Signed)
[place] MEDICAID FL2 LEVEL OF CARE FORM     IDENTIFICATION  Patient Name: Christian [name] Birthdate: 1940-10-23 Sex: male Admission Date (Current Location): 03/07/2023  [facility] and [place] Number:  Producer, television/film/video and Address:  [facility], [address] [place], [place] [address]      Provider Number: [id number]  Attending Physician Name and Address:  Zannie Cove, MD  Relative Name and Phone Number:  [name] [phone]    Current Level of Care: Hospital Recommended Level of Care: Skilled Nursing Facility Prior Approval Number:    Date Approved/Denied: 03/17/23 PASRR Number: Pending  Discharge Plan: SNF    Current Diagnoses: Patient Active Problem List   Diagnosis Date Noted   Acute urinary retention 03/10/2023   Liver lesion 03/09/2023   Acute on chronic combined systolic and diastolic CHF (congestive heart failure) (HCC) 03/07/2023   Cerebrovascular disease 03/07/2023   Acute pulmonary embolism without acute cor pulmonale (HCC) 03/07/2023   Paroxysmal atrial fibrillation (HCC) 03/07/2023   Mood disorder (HCC) 03/07/2023   Nausea without vomiting 04/29/2022   GAD (generalized anxiety disorder) 04/29/2022   Rash and nonspecific skin eruption 04/29/2022   Allergic conjunctivitis of both eyes    Stage 2 chronic kidney disease    Benign essential HTN    Chronic systolic congestive heart failure (HCC)    Acute ischemic right posterior cerebral artery (PCA) stroke (HCC) 03/01/2020   Allergic rhinitis due to pollen 03/29/2018   Oropharyngeal dysphagia 01/11/2018   GERD with esophagitis 01/11/2018   Claudication of both lower extremities (HCC) 01/06/2018   Nonischemic cardiomyopathy (HCC) 11/11/2017   Coronary artery disease involving native coronary artery of native heart without angina pectoris 11/11/2017   Depression with anxiety 07/08/2017   Type 2 diabetes mellitus with complication, without long-term current use of insulin  (HCC) 07/08/2017   Hyperlipidemia 05/25/2017    Orientation RESPIRATION BLADDER Height & Weight     Self, Place  O2 (1 liter) Continent, External catheter Weight: 145 lb 15.1 oz (66.2 kg) Height:  5\' 6"  (167.6 cm)  BEHAVIORAL SYMPTOMS/MOOD NEUROLOGICAL BOWEL NUTRITION STATUS      Continent Diet (See dc summary)  AMBULATORY STATUS COMMUNICATION OF NEEDS Skin   Limited Assist Verbally Normal                       Personal Care Assistance Level of Assistance  Bathing, Feeding, Dressing Bathing Assistance: Limited assistance Feeding assistance: Independent Dressing Assistance: Limited assistance     Functional Limitations Info  Sight, Hearing, Speech Sight Info: Adequate Hearing Info: Impaired Speech Info: Adequate    SPECIAL CARE FACTORS FREQUENCY  PT (By licensed PT), OT (By licensed OT)     PT Frequency: 5xweek OT Frequency: 5xweek            Contractures Contractures Info: Not present    Additional Factors Info  Code Status, Allergies Code Status Info: DNR Allergies Info: Amoxicillin  Cephalexin  Lipitor (Atorvastatin)  Statins  Vytorin (Ezetimibe-simvastatin)           Current Medications (03/17/2023):  This is the current hospital active medication list Current Facility-Administered Medications  Medication Dose Route Frequency Provider Last Rate Last Admin   acetaminophen (TYLENOL) tablet 650 mg  650 mg Oral Q6H PRN Andrey Farmer, PA-C   650 mg at 03/10/23 1418   Or   acetaminophen (TYLENOL) suppository 650 mg  650 mg Rectal Q6H PRN Andrey Farmer, PA-C  ALPRAZolam Prudy Feeler) tablet 0.25 mg  0.25 mg Oral BID PRN Zannie Cove, MD   0.25 mg at 03/14/23 1133   amiodarone (PACERONE) tablet 400 mg  400 mg Oral BID Andrey Farmer, PA-C   400 mg at 03/17/23 0900   apixaban (ELIQUIS) tablet 5 mg  5 mg Oral BID Andrey Farmer, PA-C   5 mg at 03/17/23 0900   calcium carbonate (TUMS - dosed in mg elemental calcium) chewable  tablet 200 mg of elemental calcium  1 tablet Oral TID PRN Andrey Farmer, PA-C   200 mg of elemental calcium at 03/11/23 1610   Chlorhexidine Gluconate Cloth 2 % PADS 6 each  6 each Topical Daily Alberteen Sam, MD   6 each at 03/17/23 0900   escitalopram (LEXAPRO) tablet 20 mg  20 mg Oral Daily Andrey Farmer, [place]   20 mg at 03/17/23 0900   ezetimibe (ZETIA) tablet 10 mg  10 mg Oral Daily Andrey Farmer, PA-C   10 mg at 03/17/23 0900   furosemide (LASIX) tablet 40 mg  40 mg Oral Daily Clegg, Amy D, NP   40 mg at 03/17/23 0900   insulin aspart (novoLOG) injection 0-15 Units  0-15 Units Subcutaneous TID WC Andrey Farmer, PA-C   2 Units at 03/17/23 1122   insulin aspart (novoLOG) injection 0-5 Units  0-5 Units Subcutaneous QHS Andrey Farmer, [place]   2 Units at 03/12/23 2145   mirtazapine (REMERON) tablet 15 mg  15 mg Oral QHS [name], DO   15 mg at 03/16/23 2133   morphine CONCENTRATE 10 MG/0.5ML oral solution 2.6 mg  2.6 mg Oral Q2H PRN Barbara Cower, NP   2.6 mg at 03/11/23 1109   ondansetron (ZOFRAN) tablet 4 mg  4 mg Oral Q6H PRN Andrey Farmer, PA-C       Or   ondansetron [facility]) injection 4 mg  4 mg Intravenous Q6H PRN Andrey Farmer, PA-C   4 mg at 03/16/23 1112   Oral care mouth rinse  15 mL Mouth Rinse PRN Zannie Cove, MD       potassium chloride SA (KLOR-CON M) CR tablet 40 mEq  40 mEq Oral Daily Andrey Farmer, [place]   40 mEq at 03/17/23 0900   rosuvastatin (CRESTOR) tablet 40 mg  40 mg Oral Daily Andrey Farmer, [place]   40 mg at 03/17/23 0900   sodium chloride flush (NS) 0.9 % injection 10-40 mL  10-40 mL Intracatheter Q12H Danford, Earl Lites, MD   10 mL at 03/17/23 0901   sodium chloride flush (NS) 0.9 % injection 10-40 mL  10-40 mL Intracatheter PRN Danford, Earl Lites, MD         Discharge Medications: Please see discharge summary for a list of discharge medications.  Relevant Imaging  Results:  Relevant Lab Results:   Additional Information SSN: [id number]  Oletta Lamas, MSW, [facility] Transitions of Care  Clinical Social Worker I

## 2023-03-17 NOTE — Telephone Encounter (Signed)
Patient Advocate Encounter  Due to holiday, this is the second business day for this application. Will follow up on 05/29 if no determination is received

## 2023-03-18 DIAGNOSIS — E1169 Type 2 diabetes mellitus with other specified complication: Secondary | ICD-10-CM | POA: Diagnosis not present

## 2023-03-18 DIAGNOSIS — R338 Other retention of urine: Secondary | ICD-10-CM

## 2023-03-18 DIAGNOSIS — I5023 Acute on chronic systolic (congestive) heart failure: Secondary | ICD-10-CM | POA: Diagnosis not present

## 2023-03-18 DIAGNOSIS — K769 Liver disease, unspecified: Secondary | ICD-10-CM

## 2023-03-18 DIAGNOSIS — I480 Paroxysmal atrial fibrillation: Secondary | ICD-10-CM | POA: Diagnosis not present

## 2023-03-18 DIAGNOSIS — I10 Essential (primary) hypertension: Secondary | ICD-10-CM | POA: Diagnosis not present

## 2023-03-18 LAB — GLUCOSE, CAPILLARY
Glucose-Capillary: 112 mg/dL — ABNORMAL HIGH (ref 70–99)
Glucose-Capillary: 128 mg/dL — ABNORMAL HIGH (ref 70–99)
Glucose-Capillary: 167 mg/dL — ABNORMAL HIGH (ref 70–99)
Glucose-Capillary: 132 mg/dL — ABNORMAL HIGH (ref 70–99)

## 2023-03-18 NOTE — Progress Notes (Signed)
OT Cancellation Note  Patient Details Name: Christian [name] MRN: [id number] DOB: July 22, 1941   Cancelled Treatment:    Reason Eval/Treat Not Completed: Patient declined, no reason specified (pt eating lunch, declines participation in therapy at this time. OT asks to come back later after pt is finished eating and pt still refuses. Only agreeable to be seen in the morning/before lunch. Will follow up next date as schedule permits)  Carver Fila, OTD, OTR/L [employer] Preferred Acute Rehab [phone]   Dalphine Handing 03/18/2023, 2:23 PM

## 2023-03-18 NOTE — Assessment & Plan Note (Addendum)
Patient off antihypertensive agents due to risk of hypotension.  Diuresis with furosemide and spironolactone.

## 2023-03-18 NOTE — Telephone Encounter (Signed)
Patient Advocate Encounter  [facility] is requesting proof of out of pocket medication costs before making a determination. Patient will need to submit pharmacy expense report. Attempted to contact patient; no answer, no voicemail available at this time. Will try back.

## 2023-03-18 NOTE — Progress Notes (Signed)
Progress Note   Patient: Christian [name] ZOX:[id number] DOB: 12-Jan-1941 DOA: 03/07/2023     11 DOS: the patient was seen and examined on 03/18/2023   Brief hospital course: [name] with history of chronic systolic CHF with recovered EF, NICM, CVA, hypertension, type 2 diabetes mellitus,?  Early dementia presented to the ED with dyspnea on exertion X several months worsened over 2 days. -Workup in the ED noted bilateral pulmonary emboli and CHF -Admitted, 5/18 started on IV heparin -5/19, diuretics started, echo noted EF down to<20% -5/20 advanced heart failure team consulted, transferred to SDU for dobutamine -5/21 tachyarrhythmia started on amiodarone palliative care consulted -5/22, reported suicidal ideation, psych consulted, dobutamine discontinued -5/23, palliative meeting again, patient and ex-wife decided to pursue SNF for short-term rehab with palliative care, cleared by psychiatry for rehab  05/29 patient hemodynamically stable but continue very weak and deconditioned.  His care giver is concerned about safety of him alone at home.   Assessment and Plan: * Acute on chronic systolic CHF (congestive heart failure) (HCC) Echocardiogram with decreased LV systolic function to <20%, global hypokinesis, internal cavity with mild dilatation, mild LVH, RV with moderate reduction in systolic function. Mild to moderate MR.   Patient with low output heart failure and required dobutamine infusion.  Fluid balance is negative 9,723 since admission.  Systolic blood pressure 90 to 811 mmHg.   Volume status and hemodynamics improved.  05/22 discontinued dobutamine.  Continue diuresis with oral furosemide.  Limited pharmacologic therapy due to hypotension,   Paroxysmal atrial fibrillation (HCC) Continue rate control with amiodarone.  Anticoagulation with apixaban.   Benign essential HTN Patient off antihypertensive agents due to risk of hypotension.   Type 2 diabetes mellitus with  hyperlipidemia (HCC) Uncontrolled with hyperglycemia.   Continue glucose cover and monitoring with insulin sliding scale.   Continue with ezetimibe.   Coronary artery disease involving native coronary artery of native heart without angina pectoris Continue blood pressure control, and ezetimibe    Mood disorder (HCC) - Continue Lexapro  Liver lesion CTA on admission showed radiographic indications of cirrhosis and known right heart failure as well as 2.3 cm indeterminate lesion. Follow up as outpatient.   Acute urinary retention Overnight 5/20, requiring repeated I/O cath, >1L return both times. Patient with foley catheter in place, will need outpatient voiding trial.         Subjective: Patient is feeling well, no dyspnea, PND, orthopnea or lower extremity edema. Continue very weak and deconditioned   Physical Exam: Vitals:   03/17/23 2340 03/18/23 0324 03/18/23 0737 03/18/23 1049  BP: 103/76 92/60 90/61  93/67  Pulse: 86 70 73 81  Resp: 19 14 (!) 21 19  Temp: 97.9 F (36.6 C) 97.7 F (36.5 C) 97.7 F (36.5 C) 97.7 F (36.5 C)  TempSrc: Oral Oral Oral Oral  SpO2: 93% 90% 92% 92%  Weight:  66.1 kg    Height:       Neurology awake and alert ENT with mild pallor Cardiovascular with S1 and S2 present, irregularly irregular with positive systolic murmur at the right lower sternal border.  No JVD No lower extremity edema Respiratory with no rales or wheezing, no rhonchi Abdomen with no distention  Data Reviewed:    Family Communication: no family at the bedside.  I spoke with patient's care giver over the phone we talked in detail about patient's condition, plan of care and prognosis and all questions were addressed.   Disposition: Status is: Inpatient Remains inpatient appropriate because: pending  placement, likely will need SNF   Planned Discharge Destination: Skilled nursing facility     Author: Coralie Keens, MD 03/18/2023 11:48 AM  For on  call review [email or web address].

## 2023-03-19 ENCOUNTER — Encounter: Payer: Self-pay | Admitting: Cardiovascular Disease

## 2023-03-19 ENCOUNTER — Ambulatory Visit: Payer: Medicare Other | Attending: Cardiovascular Disease | Admitting: Cardiovascular Disease

## 2023-03-19 DIAGNOSIS — I480 Paroxysmal atrial fibrillation: Secondary | ICD-10-CM | POA: Diagnosis not present

## 2023-03-19 DIAGNOSIS — E1169 Type 2 diabetes mellitus with other specified complication: Secondary | ICD-10-CM | POA: Diagnosis not present

## 2023-03-19 DIAGNOSIS — I10 Essential (primary) hypertension: Secondary | ICD-10-CM | POA: Diagnosis not present

## 2023-03-19 DIAGNOSIS — I5023 Acute on chronic systolic (congestive) heart failure: Secondary | ICD-10-CM | POA: Diagnosis not present

## 2023-03-19 LAB — MULTIPLE MYELOMA PANEL, SERUM
Albumin SerPl Elph-Mcnc: 2.9 g/dL (ref 2.9–4.4)
Albumin/Glob SerPl: 1.4 (ref 0.7–1.7)
Alpha 1: 0.3 g/dL (ref 0.0–0.4)
Alpha2 Glob SerPl Elph-Mcnc: 0.8 g/dL (ref 0.4–1.0)
B-Globulin SerPl Elph-Mcnc: 0.7 g/dL (ref 0.7–1.3)
Gamma Glob SerPl Elph-Mcnc: 0.5 g/dL (ref 0.4–1.8)
Globulin, Total: 2.2 g/dL (ref 2.2–3.9)
IgA: 167 mg/dL (ref 61–437)
IgG (Immunoglobin G), Serum: 482 mg/dL — ABNORMAL LOW (ref 603–1613)
IgM (Immunoglobulin M), Srm: 76 mg/dL (ref 15–143)
Total Protein ELP: 5.1 g/dL — ABNORMAL LOW (ref 6.0–8.5)

## 2023-03-19 LAB — RENAL FUNCTION PANEL
Albumin: 2.8 g/dL — ABNORMAL LOW (ref 3.5–5.0)
Anion gap: 10 (ref 5–15)
BUN: 13 mg/dL (ref 8–23)
CO2: 26 mmol/L (ref 22–32)
Calcium: 8 mg/dL — ABNORMAL LOW (ref 8.9–10.3)
Chloride: 100 mmol/L (ref 98–111)
Creatinine, Ser: 1.08 mg/dL (ref 0.61–1.24)
GFR, Estimated: >60 mL/min (ref >60)
Glucose, Bld: 125 mg/dL — ABNORMAL HIGH (ref 70–99)
Phosphorus: 3.5 mg/dL (ref 2.5–4.6)
Potassium: 3.6 mmol/L (ref 3.5–5.1)
Sodium: 136 mmol/L (ref 135–145)

## 2023-03-19 LAB — GLUCOSE, CAPILLARY
Glucose-Capillary: 122 mg/dL — ABNORMAL HIGH (ref 70–99)
Glucose-Capillary: 112 mg/dL — ABNORMAL HIGH (ref 70–99)
Glucose-Capillary: 131 mg/dL — ABNORMAL HIGH (ref 70–99)
Glucose-Capillary: 147 mg/dL — ABNORMAL HIGH (ref 70–99)

## 2023-03-19 MED ORDER — SPIRONOLACTONE 12.5 MG HALF TABLET
12.5000 mg | ORAL_TABLET | Freq: Every day | ORAL | Status: DC
  Administered 2023-03-19 – 2023-03-21 (×3): 12.5 mg via ORAL
  Filled 2023-03-19 (×3): qty 1

## 2023-03-19 NOTE — Social Work (Addendum)
Name: Christian [name] DOB: May 16, 1941 Please be advised that the above-named patient will require a short-term nursing home stay -- anticipated 30 days or less for rehabilitation and strengthening. The plan is for return home.

## 2023-03-19 NOTE — Assessment & Plan Note (Addendum)
CT chest with bilateral pulmonary embolism. Possible due to low flow heart failure.  Plan to continue anticoagulation with apixaban.   Acute hypoxemic respiratory failure.  After diuresis he continue to have 02 desaturation on ambulation, added supplemental 02 to continue at home.

## 2023-03-19 NOTE — Progress Notes (Signed)
Occupational Therapy Treatment Patient Details Name: Christian [name] MRN: [id number] DOB: 1941-02-17 Today's Date: 03/19/2023   History of present illness 82 y.o. male adm 5/18 with SOB x1 month. Pt with multiple bil segmental PE, started on heparin and CHF exacerbation. PMHx: R PCA CVA, HFrEF, CAD, HTN, GERD, Hernia of abdominal cavity, HLD, Neuropathy, DM II.   OT comments  Patient with fair progress toward patient focused goals.  Patient having increased difficulty with bed mobility this session, needing Min A.  In addition, Min A for sit to stand, and demonstrates more posterior lean in standing this date needing cues and assist to lean forward.  Patient needing Max A for lower body ADL, and cues for sequencing.  OT will continue efforts in the acute setting to address deficits, and Patient will benefit from continued inpatient follow up therapy, <3 hours/day.     Recommendations for follow up therapy are one component of a multi-disciplinary discharge planning process, led by the attending physician.  Recommendations may be updated based on patient status, additional functional criteria and insurance authorization.    Assistance Recommended at Discharge Frequent or constant Supervision/Assistance  Patient can return home with the following  A little help with walking and/or transfers;A little help with bathing/dressing/bathroom;Assistance with cooking/housework;Direct supervision/assist for medications management;Direct supervision/assist for financial management;Assist for transportation;Help with stairs or ramp for entrance   Equipment Recommendations  None recommended by OT    Recommendations for Other Services      Precautions / Restrictions Precautions Precautions: Fall Precaution Comments: watch SpO2 Restrictions Weight Bearing Restrictions: No       Mobility Bed Mobility Overal bed mobility: Needs Assistance Bed Mobility: Supine to Sit     Supine to sit: Min guard           Transfers Overall transfer level: Needs assistance Equipment used: Rollator (4 wheels) Transfers: Sit to/from Stand, Bed to chair/wheelchair/BSC Sit to Stand: Min guard, Min assist     Step pivot transfers: Min guard, Min assist           Balance Overall balance assessment: Needs assistance Sitting-balance support: No upper extremity supported Sitting balance-Leahy Scale: Good     Standing balance support: Bilateral upper extremity supported Standing balance-Leahy Scale: Poor                             ADL either performed or assessed with clinical judgement   ADL   Eating/Feeding: Set up;Sitting   Grooming: Oral care;Standing;Min guard               Lower Body Dressing: Maximal assistance;Sit to/from stand   Toilet Transfer: Diplomatic Services operational officer (4 wheels)                  Extremity/Trunk Assessment Upper Extremity Assessment Upper Extremity Assessment: Overall WFL for tasks assessed   Lower Extremity Assessment Lower Extremity Assessment: Defer to PT evaluation   Cervical / Trunk Assessment Cervical / Trunk Assessment: Kyphotic    Vision       Perception     Praxis      Cognition Arousal/Alertness: Awake/alert Behavior During Therapy: Flat affect Overall Cognitive Status: History of cognitive impairments - at baseline                           Safety/Judgement: Decreased awareness of safety, Decreased awareness of deficits   Problem Solving: Slow processing  Exercises      Shoulder Instructions       General Comments      Pertinent Vitals/ Pain       Pain Assessment Pain Assessment: No/denies pain                                                          Frequency  Min 1X/week        Progress Toward Goals  OT Goals(current goals can now be found in the care plan section)  Progress towards OT goals: Progressing toward goals  Acute Rehab OT Goals OT  Goal Formulation: With patient Time For Goal Achievement: 03/23/23 Potential to Achieve Goals: Fair  Plan Discharge plan remains appropriate    Co-evaluation                 AM-PAC OT "6 Clicks" Daily Activity     Outcome Measure   Help from another person eating meals?: A Little Help from another person taking care of personal grooming?: A Little Help from another person toileting, which includes using toliet, bedpan, or urinal?: A Lot Help from another person bathing (including washing, rinsing, drying)?: A Lot Help from another person to put on and taking off regular upper body clothing?: A Lot Help from another person to put on and taking off regular lower body clothing?: A Lot 6 Click Score: 14    End of Session Equipment Utilized During Treatment: Gait belt;Rollator (4 wheels);Oxygen  OT Visit Diagnosis: Unsteadiness on feet (R26.81);Other abnormalities of gait and mobility (R26.89);Muscle weakness (generalized) (M62.81)   Activity Tolerance Patient tolerated treatment well   Patient Left with call bell/phone within reach;in chair;with chair alarm set   Nurse Communication Mobility status        Time: 1030-1055 OT Time Calculation (min): 25 min  Charges: OT General Charges $OT Visit: 1 Visit OT Treatments $Self Care/Home Management : 23-37 mins  03/19/2023  RP, OTR/L  Acute Rehabilitation Services  Office:  [phone]   Suzanna Obey 03/19/2023, 10:59 AM

## 2023-03-19 NOTE — Progress Notes (Addendum)
Progress Note   Patient: Christian [name] ZOX:[id number] DOB: 1941-09-26 DOA: 03/07/2023     12 DOS: the patient was seen and examined on 03/19/2023   Brief hospital course: [name] with history of chronic systolic CHF with recovered EF, NICM, CVA, hypertension, type 2 diabetes mellitus,?  Early dementia presented to the ED with dyspnea on exertion X several months worsened over 2 days. -Workup in the ED noted bilateral pulmonary emboli and CHF -Admitted, 5/18 started on IV heparin -5/19, diuretics started, echo noted EF down to<20% -5/20 advanced heart failure team consulted, transferred to SDU for dobutamine -5/21 tachyarrhythmia started on amiodarone palliative care consulted -5/22, reported suicidal ideation, psych consulted, dobutamine discontinued -5/23, palliative meeting again, patient and ex-wife decided to pursue SNF for short-term rehab with palliative care, cleared by psychiatry for rehab  05/29 patient hemodynamically stable but continue very weak and deconditioned.  His care giver is concerned about safety of him alone at home.  05/30 patient continue very weak, possible transition to SNF.   Assessment and Plan: * Acute on chronic systolic CHF (congestive heart failure) (HCC) Echocardiogram with decreased LV systolic function to <20%, global hypokinesis, internal cavity with mild dilatation, mild LVH, RV with moderate reduction in systolic function. Mild to moderate MR. Suspect low gradient moderate aortic stenosis.   Low flow aortic stenosis, not candidate for TAVR due to cognitive impairment.   Patient with low output heart failure and required dobutamine infusion.  Fluid balance is negative 10,533 ml since admission.  Systolic blood pressure 90 to 811 mmHg.   Volume status and hemodynamics improved.  05/22 discontinued dobutamine.  Continue diuresis with oral furosemide.  Limited pharmacologic therapy due to hypotension.  Will add spironolactone 12,5 mg po daily.    Paroxysmal atrial fibrillation (HCC) Continue rate control with amiodarone.  Anticoagulation with apixaban.   Acute pulmonary embolism (HCC) CT chest with bilateral pulmonary embolism. Possible due to low flow heart failure.  Plan to continue anticoagulation with apixaban.   Benign essential HTN Patient off antihypertensive agents due to risk of hypotension.  Diuresis with furosemide and will add spironolactone.   Type 2 diabetes mellitus with hyperlipidemia (HCC) Uncontrolled with hyperglycemia.   Continue glucose cover and monitoring with insulin sliding scale.   Continue with ezetimibe.   Coronary artery disease involving native coronary artery of native heart without angina pectoris Continue blood pressure control, and ezetimibe    Mood disorder (HCC) Cognitive impairment.  He had suicidal ideation. Patient was evaluated by psychiatry, and recommended continue medical therapy. He had no longer suicidal thoughts.  - Continue Lexapro  Liver lesion CTA on admission showed radiographic indications of cirrhosis and known right heart failure as well as 2.3 cm indeterminate lesion. Follow up as outpatient.   Acute urinary retention Overnight 5/20, requiring repeated I/O cath, >1L return both times. Patient with foley catheter in place, will need outpatient voiding trial.         Subjective: patient with no chest pain or dyspnea, continue to be very weak and deconditioned. His wife is concerned about him being home alone.   Physical Exam: Vitals:   03/18/23 2325 03/19/23 0407 03/19/23 0456 03/19/23 0818  BP: 91/64 92/63  122/84  Pulse: 76 74  87  Resp: 20 18  18   Temp: 97.8 F (36.6 C) 98 F (36.7 C)  97.6 F (36.4 C)  TempSrc:  Oral  Axillary  SpO2: 93% 97%  96%  Weight:   66.2 kg   Height:  Neurology somnolent but easy to arouse ENT with mild pallor Cardiovascular with S1 and S2 present and rhythmic with no gallops, or rubs No JVD No lower  extremity edema Respiratory with no rales or wheezing, no rhonchi Abdomen with no distention  Data Reviewed:    Family Communication: no family at the bedside   Disposition: Status is: Inpatient Remains inpatient appropriate because: heart failure   Planned Discharge Destination: Skilled nursing facility      Author: Coralie Keens, MD 03/19/2023 10:58 AM  For on call review [email or web address].

## 2023-03-20 ENCOUNTER — Other Ambulatory Visit (HOSPITAL_COMMUNITY): Payer: Self-pay

## 2023-03-20 DIAGNOSIS — I5023 Acute on chronic systolic (congestive) heart failure: Secondary | ICD-10-CM | POA: Diagnosis not present

## 2023-03-20 DIAGNOSIS — I2699 Other pulmonary embolism without acute cor pulmonale: Secondary | ICD-10-CM | POA: Diagnosis not present

## 2023-03-20 DIAGNOSIS — E1169 Type 2 diabetes mellitus with other specified complication: Secondary | ICD-10-CM | POA: Diagnosis not present

## 2023-03-20 DIAGNOSIS — I10 Essential (primary) hypertension: Secondary | ICD-10-CM | POA: Diagnosis not present

## 2023-03-20 LAB — GLUCOSE, CAPILLARY
Glucose-Capillary: 118 mg/dL — ABNORMAL HIGH (ref 70–99)
Glucose-Capillary: 214 mg/dL — ABNORMAL HIGH (ref 70–99)
Glucose-Capillary: 162 mg/dL — ABNORMAL HIGH (ref 70–99)
Glucose-Capillary: 127 mg/dL — ABNORMAL HIGH (ref 70–99)

## 2023-03-20 MED ORDER — POTASSIUM CHLORIDE CRYS ER 20 MEQ PO TBCR: 20.0000 meq | EXTENDED_RELEASE_TABLET | Freq: Every day | ORAL | Status: DC

## 2023-03-20 MED ORDER — AMIODARONE HCL 200 MG PO TABS
200.0000 mg | ORAL_TABLET | Freq: Two times a day (BID) | ORAL | Status: DC
  Administered 2023-03-20 – 2023-03-21 (×2): 200 mg via ORAL
  Filled 2023-03-20 (×2): qty 1

## 2023-03-20 MED ORDER — SPIRONOLACTONE 25 MG PO TABS
12.5000 mg | ORAL_TABLET | Freq: Every day | ORAL | 0 refills | Status: DC
  Filled 2023-03-20: qty 15, 30d supply, fill #0

## 2023-03-20 NOTE — Progress Notes (Addendum)
Progress Note   Patient: Christian [name] ZOX:[id number] DOB: 02/25/1941 DOA: 03/07/2023     13 DOS: the patient was seen and examined on 03/20/2023   Brief hospital course: Mr. Sellen was admitted to the hospital with the working diagnosis of heat failure exacerbation.   [name] with history of chronic systolic CHF, NICM, CVA, hypertension, type 2 diabetes mellitus, and cognitive impairment who presented to the ED with dyspnea on exertion for several months worsened over 2 days. On the day of admission his symptoms were very severe, he was pale and very weak, EMS was called and patient was transported to the ED. On his initial physical examination his blood pressure was 104/88, HR 82, RR 80 and 02 saturation 60 to 88%. Lungs with decreases breath sounds bilaterally, heart with S1 and S2 present irregularly irregular, abdomen with no distention and positive lower extremity edema.   Chest radiograph with cardiomegaly and bilateral hilar vascular congestion.  CT chest with bilateral faint ground glass opacities, with small bilateral pleural effusions. Bilateral pulmonary embolism in the central right upper lobe pulmonary artery and segmental branches, anterior segmental branch in the right lower lobe pulmonary artery, and posterior segmental branches of the left lower lobe pulmonary artery. No evidence or elevated RV/LV ration.    Started on IV heparin -5/19, diuretics started, echo noted EF down to<20% -5/20 advanced heart failure team consulted, transferred to SDU for dobutamine -5/21 tachyarrhythmia started on amiodarone palliative care consulted -5/22, reported suicidal ideation, psych consulted, dobutamine discontinued -5/23, palliative meeting again, patient and ex-wife decided to pursue SNF for short-term rehab with palliative care, cleared by psychiatry for rehab  05/29 patient hemodynamically stable but continue very weak and deconditioned.  His care giver is concerned about safety of him alone  at home.  05/30 patient continue very weak, possible transition to SNF.   Assessment and Plan: * Acute on chronic systolic CHF (congestive heart failure) (HCC) Echocardiogram with decreased LV systolic function to <20%, global hypokinesis, internal cavity with mild dilatation, mild LVH, RV with moderate reduction in systolic function. Mild to moderate MR. Suspect low gradient moderate aortic stenosis.   Low flow aortic stenosis, not candidate for TAVR due to cognitive impairment.   Patient with low output heart failure and required dobutamine infusion.  He has achieved negative fluid balance. Systolic blood pressure 92 to 811 mmHg.   Continue diuresis with oral furosemide and spironolactone.  Limited pharmacologic therapy due to hypotension.   Paroxysmal atrial fibrillation (HCC) Continue rate control with amiodarone.  Anticoagulation with apixaban.  Ok to discontinue telemetry.   Acute pulmonary embolism (HCC) CT chest with bilateral pulmonary embolism. Possible due to low flow heart failure.  Plan to continue anticoagulation with apixaban.   Benign essential HTN Patient off antihypertensive agents due to risk of hypotension.  Diuresis with furosemide and spironolactone.   Type 2 diabetes mellitus with hyperlipidemia (HCC) Uncontrolled with hyperglycemia.   Continue glucose cover and monitoring with insulin sliding scale.   Continue with ezetimibe.   Coronary artery disease involving native coronary artery of native heart without angina pectoris Continue blood pressure control, and ezetimibe    Mood disorder (HCC) Cognitive impairment.  He had suicidal ideation. Patient was evaluated by psychiatry, and recommended continue medical therapy. He had no longer suicidal thoughts.  - Continue Lexapro  Liver lesion CTA on admission showed radiographic indications of cirrhosis and known right heart failure as well as 2.3 cm indeterminate lesion. Follow up as outpatient.    Acute urinary  retention Overnight 5/20, requiring repeated I/O cath, >1L return both times. Patient with foley catheter in place, will need outpatient voiding trial.         Subjective: patient this am is out of bed to the chair, no edema, PND or orthopnea. He has been working with physical therapy. Continue very weak and deconditioned. Positive confusion but not agitation   Physical Exam: Vitals:   03/19/23 1946 03/19/23 2327 03/20/23 0314 03/20/23 0743  BP: 92/66 97/75 (!) 86/63   Pulse: 94 95 66   Resp: 19 18 19    Temp: 97.7 F (36.5 C) 97.6 F (36.4 C) 97.6 F (36.4 C)   TempSrc: Oral Oral Axillary Oral  SpO2: 95% 98% 92%   Weight:      Height:       Neurology awake and alert ENT with mild pallor Cardiovascular with S1 and S2 present with no gallops, rubs or murmurs No JVD No lower extremity edema Respiratory with no rales or wheezing, no rhonchi Abdomen with no distention  Data Reviewed:    Family Communication: no family at the bedside   Disposition: Status is: Inpatient Remains inpatient appropriate because: pending SNF  Planned Discharge Destination: Skilled nursing facility      Author: Coralie Keens, MD 03/20/2023 9:36 AM  For on call review [email or web address].

## 2023-03-20 NOTE — Progress Notes (Signed)
SATURATION QUALIFICATIONS: (This note is used to comply with regulatory documentation for home oxygen)  Patient Saturations on Room Air at Rest =87%  Patient Saturations on Room Air while Ambulating =85%  Patient Saturations on 2 Liters of oxygen while Ambulating = 94%   

## 2023-03-20 NOTE — TOC Progression Note (Signed)
Transition of Care [facility]) - Progression Note    Patient Details  Name: Christian [name] MRN: [id number] Date of Birth: 02/17/1941  Transition of [facility]) CM/SW Contact  Leander Rams, LCSW Phone Number: 03/20/2023, 11:00 AM  Clinical Narrative:    CSW received phone call from [facility] stating that a peer to peer is required. CSW informed MD. MD spoke with family. The plan is pt will dc home. Peer to peer will not be pursed. CM was made aware of disposition change.   TOC will continue to follow.   Expected Discharge Plan: Home w Home Health Services Barriers to Discharge: Continued Medical Work up  Expected Discharge Plan and Services   Discharge Planning Services: CM Consult Post Acute Care Choice: Hospice, Skilled Nursing Facility Living arrangements for the past 2 months: Single Family Home                 DME Arranged: 3-N-1 DME Agency: [employer] Date DME Agency Contacted: 03/11/23 Time DME Agency Contacted: [phone] Representative spoke with at DME Agency: Shaune Leeks HH Arranged: PT, RN [facility] Agency: [facility] Date [facility] Agency Contacted: 03/10/23 Time HH Agency Contacted: [phone] Representative spoke with at [facility] Agency: Lorenza Chick   Social Determinants of Health (SDOH) Interventions SDOH Screenings   Food Insecurity: No Food Insecurity (03/10/2023)  Housing: Low Risk  (03/10/2023)  Transportation Needs: No Transportation Needs (03/10/2023)  Utilities: Not At Risk (03/10/2023)  Alcohol Screen: Low Risk  (10/12/2019)  Depression (PHQ2-9): Medium Risk (04/29/2022)  Financial Resource Strain: Low Risk  (03/10/2023)  Tobacco Use: Low Risk  (03/07/2023)    Readmission Risk Interventions     No data to display        Oletta Lamas, MSW, LCSWA, LCASA Transitions of Care  Clinical Social Worker I

## 2023-03-20 NOTE — TOC Progression Note (Addendum)
Transition of Care [facility]) - Progression Note    Patient Details  Name: Christian [name] MRN: [id number] Date of Birth: 09-Apr-1941  Transition of Care [facility]) CM/SW Contact  Elliot Cousin, RN Phone Number: [phone] 03/20/2023, 2:06 PM  Clinical Narrative:   CM received message from attending that plan is to dc home with [facility]. Ex-wife has arranged for supervision for patient at home. [employer] Medicare, per Unit CSW was requiring a P2P be completed for SNF placement. HH arranged with [facility]. Will notify rep, Kandee Keen of scheduled dc home tomorrow. Pt will possibly need oxygen for home. Will have Unit RN check ambulatory oxygen saturation. Will notify [facility] for outpatient palliative services post dc.   Contacted [employer] rep and will deliver Rollator and oxygen to room this afternoon, for scheduled dc home tomorrow.     Expected Discharge Plan: Home w Home Health Services Barriers to Discharge: Continued Medical Work up  Expected Discharge Plan and Services   Discharge Planning Services: CM Consult Post Acute Care Choice: Hospice, Skilled Nursing Facility Living arrangements for the past 2 months: Single Family Home                 DME Arranged: 3-N-1 DME Agency: [employer] Date DME Agency Contacted: 03/11/23 Time DME Agency Contacted: [phone] Representative spoke with at DME Agency: Shaune Leeks HH Arranged: PT, RN [facility] Agency: [facility] Date [facility] Agency Contacted: 03/10/23 Time HH Agency Contacted: [phone] Representative spoke with at [facility] Agency: Lorenza Chick   Social Determinants of Health (SDOH) Interventions SDOH Screenings   Food Insecurity: No Food Insecurity (03/10/2023)  Housing: Low Risk  (03/10/2023)  Transportation Needs: No Transportation Needs (03/10/2023)  Utilities: Not At Risk (03/10/2023)  Alcohol Screen: Low Risk  (10/12/2019)  Depression (PHQ2-9): Medium Risk (04/29/2022)  Financial Resource Strain: Low Risk  (03/10/2023)  Tobacco  Use: Low Risk  (03/07/2023)    Readmission Risk Interventions     No data to display

## 2023-03-20 NOTE — Progress Notes (Signed)
Physical Therapy Treatment Patient Details Name: Christian [name] MRN: [id number] DOB: [birth year] Today's Date: 03/20/2023   History of Present Illness 82 y.o. male adm 5/18 with SOB x1 month. Pt with multiple bil segmental PE, started on heparin and CHF exacerbation. PMHx: R PCA CVA, HFrEF, CAD, HTN, GERD, Hernia of abdominal cavity, HLD, Neuropathy, DM II.    PT Comments    Pt pleasant and wiling to mobilize prior to breakfast. Pt not oriented to day or time of day but able to increase gait distance and stability with rollator. PT with SPo2 92% on 1L. Pt with education for orientation and safety and encouraged to continue mobility with staff. Pt in chair for breakfast and deferred further activity at this time.   HR 76-84   Recommendations for follow up therapy are one component of a multi-disciplinary discharge planning process, led by the attending physician.  Recommendations may be updated based on patient status, additional functional criteria and insurance authorization.  Follow Up Recommendations  Can patient physically be transported by private vehicle: Yes    Assistance Recommended at Discharge Intermittent Supervision/Assistance  Patient can return home with the following A little help with walking and/or transfers;A little help with bathing/dressing/bathroom;Assistance with cooking/housework;Direct supervision/assist for medications management;Direct supervision/assist for financial management;Assist for transportation;Help with stairs or ramp for entrance   Equipment Recommendations  None recommended by PT    Recommendations for Other Services       Precautions / Restrictions Precautions Precautions: Fall Precaution Comments: watch SpO2 Restrictions Weight Bearing Restrictions: No     Mobility  Bed Mobility               General bed mobility comments: in chair on arrival and end of session    Transfers Overall transfer level: Needs assistance   Transfers:  Sit to/from Stand Sit to Stand: Supervision           General transfer comment: cues for hand placement with rollator present to push off surface not pull on rollator    Ambulation/Gait Ambulation/Gait assistance: Supervision Gait Distance (Feet): 550 Feet Assistive device: Rollator (4 wheels) Gait Pattern/deviations: Step-through pattern, Decreased stride length, Trunk flexed   Gait velocity interpretation: 1.31 - 2.62 ft/sec, indicative of limited community ambulator   General Gait Details: cues for proximity to Rollator x 1, pt with short almost festinating gait but maintains balance with UB support. cues for direction in unfamiliar environment, pt able to recall room number but unable to way find with use of cues and signs.   Stairs             Wheelchair Mobility    Modified Rankin (Stroke Patients Only)       Balance Overall balance assessment: Needs assistance   Sitting balance-Leahy Scale: Good     Standing balance support: Bilateral upper extremity supported Standing balance-Leahy Scale: Poor Standing balance comment: pt can static stand without support, rollator for gait                            Cognition Arousal/Alertness: Awake/alert Behavior During Therapy: Flat affect Overall Cognitive Status: History of cognitive impairments - at baseline Area of Impairment: Orientation, Memory, Problem solving, Safety/judgement                 Orientation Level: Time   Memory: Decreased short-term memory Following Commands: Follows one step commands consistently Safety/Judgement: Decreased awareness of safety, Decreased awareness of deficits   Problem Solving:  Slow processing          Exercises      General Comments        Pertinent Vitals/Pain Pain Assessment Pain Assessment: No/denies pain    Home Living                          Prior Function            PT Goals (current goals can now be found in the  care plan section) Progress towards PT goals: Progressing toward goals    Frequency    Min 1X/week      PT Plan Current plan remains appropriate    Co-evaluation              AM-PAC PT "6 Clicks" Mobility   Outcome Measure  Help needed turning from your back to your side while in a flat bed without using bedrails?: None Help needed moving from lying on your back to sitting on the side of a flat bed without using bedrails?: None Help needed moving to and from a bed to a chair (including a wheelchair)?: A Little Help needed standing up from a chair using your arms (e.g., wheelchair or bedside chair)?: A Little Help needed to walk in hospital room?: A Little Help needed climbing 3-5 steps with a railing? : A Little 6 Click Score: 20    End of Session   Activity Tolerance: Patient tolerated treatment well Patient left: in chair;with call bell/phone within reach;with chair alarm set Nurse Communication: Mobility status PT Visit Diagnosis: Other abnormalities of gait and mobility (R26.89)     Time: 1610-9604 PT Time Calculation (min) (ACUTE ONLY): 19 min  Charges:  $Gait Training: 8-22 mins                     [name], PT Acute Rehabilitation Services Office: [phone]    Christian [name] 03/20/2023, 10:21 AM

## 2023-03-21 DIAGNOSIS — N179 Acute kidney failure, unspecified: Secondary | ICD-10-CM

## 2023-03-21 DIAGNOSIS — I480 Paroxysmal atrial fibrillation: Secondary | ICD-10-CM | POA: Diagnosis not present

## 2023-03-21 DIAGNOSIS — I5023 Acute on chronic systolic (congestive) heart failure: Secondary | ICD-10-CM | POA: Diagnosis not present

## 2023-03-21 DIAGNOSIS — I10 Essential (primary) hypertension: Secondary | ICD-10-CM | POA: Diagnosis not present

## 2023-03-21 DIAGNOSIS — I2699 Other pulmonary embolism without acute cor pulmonale: Secondary | ICD-10-CM | POA: Diagnosis not present

## 2023-03-21 LAB — GLUCOSE, CAPILLARY: Glucose-Capillary: 94 mg/dL (ref 70–99)

## 2023-03-21 LAB — BASIC METABOLIC PANEL
Anion gap: 13 (ref 5–15)
BUN: 21 mg/dL (ref 8–23)
CO2: 25 mmol/L (ref 22–32)
Calcium: 8.3 mg/dL — ABNORMAL LOW (ref 8.9–10.3)
Chloride: 98 mmol/L (ref 98–111)
Creatinine, Ser: 1.27 mg/dL — ABNORMAL HIGH (ref 0.61–1.24)
GFR, Estimated: 56 mL/min — ABNORMAL LOW (ref >60)
Glucose, Bld: 109 mg/dL — ABNORMAL HIGH (ref 70–99)
Potassium: 4 mmol/L (ref 3.5–5.1)
Sodium: 136 mmol/L (ref 135–145)

## 2023-03-21 MED ORDER — FUROSEMIDE 40 MG PO TABS: 40.0000 mg | ORAL_TABLET | Freq: Every day | ORAL | 0 refills | Status: DC

## 2023-03-21 MED ORDER — AMIODARONE HCL 200 MG PO TABS: 200.0000 mg | ORAL_TABLET | Freq: Two times a day (BID) | ORAL | 0 refills | Status: AC

## 2023-03-21 MED ORDER — APIXABAN 5 MG PO TABS: 5.0000 mg | ORAL_TABLET | Freq: Two times a day (BID) | ORAL | 0 refills | Status: DC

## 2023-03-21 MED ORDER — SPIRONOLACTONE 25 MG PO TABS: 12.5000 mg | ORAL_TABLET | Freq: Every day | ORAL | 0 refills | Status: DC

## 2023-03-21 MED ORDER — TAMSULOSIN HCL 0.4 MG PO CAPS: 0.4000 mg | ORAL_CAPSULE | Freq: Every day | ORAL | 0 refills | Status: DC

## 2023-03-21 MED ORDER — POTASSIUM CHLORIDE CRYS ER 10 MEQ PO TBCR: 10.0000 meq | EXTENDED_RELEASE_TABLET | Freq: Every day | ORAL | 0 refills | Status: DC

## 2023-03-21 MED ORDER — FUROSEMIDE 40 MG PO TABS
40.0000 mg | ORAL_TABLET | Freq: Every day | ORAL | Status: DC
  Filled 2023-03-21: qty 1

## 2023-03-21 MED ORDER — TAMSULOSIN HCL 0.4 MG PO CAPS: 0.4000 mg | ORAL_CAPSULE | Freq: Every day | ORAL | Status: DC

## 2023-03-21 NOTE — Progress Notes (Signed)
Mobility Specialist Progress Note    03/21/23 0843  Mobility  Activity Ambulated with assistance in hallway  Level of Assistance Contact guard assist, steadying assist  Assistive Device Four wheel walker  Distance Ambulated (ft) 260 ft  Activity Response Tolerated well  Mobility Referral Yes  $Mobility charge 1 Mobility  Mobility Specialist Start Time (ACUTE ONLY) [phone]  Mobility Specialist Stop Time (ACUTE ONLY) 0842  Mobility Specialist Time Calculation (min) (ACUTE ONLY) 21 min   Pre-Mobility: 95% SpO2 Post-Mobility: 90% SpO2  Pt received in chair and agreeable. No complaints on walk. Returned to chair with call bell in reach.  [facility] [place] [facility] Mobility Specialist  Please Neurosurgeon or Rehab Office at [phone]

## 2023-03-21 NOTE — Progress Notes (Signed)
Pt is increasingly confused repetivley asking when he will be discharged and wanting RN to call mother to take him home. RN voiced concerns sending pt home alone with new 02 machine and foley cath to MD who requested RN speak with pts support system. RN spoke with ex wife over the phone and family friend at the bedside who agreed pt should go home and that a family friend would "check in " on pt in his home later that day.  RN communicated to MD family response  Family friend at bedside with pt education on foley care and home 02

## 2023-03-21 NOTE — Assessment & Plan Note (Signed)
Renal function has improved, peak serum cr at 1,34, at the time of his discharge is 1,27 with K at 4,0 and serum bicarbonate at 25.  Na 136 and Mg 2,0   Patient will continue furosemide and spironolactone.  Follow up renal function as outpatient.  Continue K supplementation 10 meq per day.

## 2023-03-21 NOTE — Discharge Summary (Addendum)
Physician Discharge Summary   Patient: Christian [name] MRN: [id number] DOB: Aug 08, 1941  Admit date:     03/07/2023  Discharge date: 03/21/23  Discharge Physician: York Ram Graeme Menees   PCP: [name], PA   Recommendations at discharge:    Patient has improved in his mobility, and plan for discharge home with home health services.  Continue diuresis with furosemide and spironolactone. Limited guideline directed medical therapy due to risk of hypotension.  Patient is being discharged with foley catheter. Added flomax and ambulatory referral for Urology. Will need outpatient voiding trial.  Supplemental home 02 has been arranged.  Follow up renal function and electrolytes in 7 days Follow up with [name] PA in 7 to 10 days.  Follow up with Cardiology as outpatient.   Discharge Diagnoses: Principal Problem:   Acute on chronic systolic CHF (congestive heart failure) (HCC) Active Problems:   Acute pulmonary embolism (HCC)   Paroxysmal atrial fibrillation (HCC)   Benign essential HTN   Type 2 diabetes mellitus with hyperlipidemia (HCC)   Coronary artery disease involving native coronary artery of native heart without angina pectoris   Mood disorder (HCC)   Liver lesion   Acute urinary retention  Resolved Problems:   * No resolved hospital problems. [facility] Course: Christian [name] was admitted to the hospital with the working diagnosis of heat failure exacerbation.   82/M with history of chronic systolic CHF, NICM, CVA, hypertension, type 2 diabetes mellitus, and cognitive impairment who presented to the ED with dyspnea on exertion for several months worsened over 2 days. On the day of admission his symptoms were very severe, he was pale and very weak, EMS was called and patient was transported to the ED. On his initial physical examination his blood pressure was 104/88, HR 82, RR 80 and 02 saturation 60 to 88%. Lungs with decreases breath sounds bilaterally, heart with S1  and S2 present irregularly irregular, abdomen with no distention and positive lower extremity edema.   Na 138, K 3,7 Cl 102 bicarbonate 25 glucose 139, bun 26 cr 1,21 Mg 1,9  BNP 2,688 High sensitive troponin 528, 584, 527  Wbc 9,1 hgb 12,8 plt 217   Chest radiograph with cardiomegaly and bilateral hilar vascular congestion.  CT chest with bilateral faint ground glass opacities, with small bilateral pleural effusions. Bilateral pulmonary embolism in the central right upper lobe pulmonary artery and segmental branches, anterior segmental branch in the right lower lobe pulmonary artery, and posterior segmental branches of the left lower lobe pulmonary artery. No evidence or elevated RV/LV ration.    EKG 95 bpm, right axis deviation, qtc 575, interventricular conduction delay, atrial fibrillation rhythm with poor R R wave progression, no significant ST segment or T wave changes.   Started on IV heparin -5/19, diuretics started, echo noted EF down to<20% -5/20 advanced heart failure team consulted, transferred to SDU for dobutamine -5/21 tachyarrhythmia started on amiodarone palliative care consulted -5/22, reported suicidal ideation, psych consulted, dobutamine discontinued -5/23, palliative meeting again, patient and ex-wife decided to pursue SNF for short-term rehab with palliative care, cleared by psychiatry for rehab  05/29 patient hemodynamically stable but continue very weak and deconditioned.  His care giver is concerned about safety of him alone at home.  05/30 patient continue very weak, possible transition to SNF.  06/01 patient has improved his mobility, no dyspnea or chest pain. Considering his improvement in physical function, plan is to discharge home with home health services.  Follow up as outpatient.  Assessment and Plan: * Acute on chronic systolic CHF (congestive heart failure) (HCC) Echocardiogram with decreased LV systolic function to <20%, global hypokinesis,  internal cavity with mild dilatation, mild LVH, RV with moderate reduction in systolic function. Mild to moderate MR. Suspect low gradient moderate aortic stenosis.   Low flow aortic stenosis, not candidate for TAVR due to cognitive impairment.   Patient with low output heart failure and required dobutamine infusion.  His was placed on IV furosemide for diuresis, negative fluid balance was achieved, -11,733 ml with significant improvement in his symptoms.  Continue diuresis with oral furosemide and spironolactone.  Limited pharmacologic therapy due to hypotension.   Paroxysmal atrial fibrillation (HCC) Continue rate control with amiodarone.  Anticoagulation with apixaban.   Acute pulmonary embolism (HCC) CT chest with bilateral pulmonary embolism. Possible due to low flow heart failure.  Plan to continue anticoagulation with apixaban.   Acute hypoxemic respiratory failure.  After diuresis he continue to have 02 desaturation on ambulation, added supplemental 02 to continue at home.   Benign essential HTN Patient off antihypertensive agents due to risk of hypotension.  Diuresis with furosemide and spironolactone.   AKI (acute kidney injury) (HCC) Renal function has improved, peak serum cr at 1,34, at the time of his discharge is 1,27 with K at 4,0 and serum bicarbonate at 25.  Na 136 and Mg 2,0   Patient will continue furosemide and spironolactone.  Follow up renal function as outpatient.  Continue K supplementation 10 meq per day.   Type 2 diabetes mellitus with hyperlipidemia (HCC) Uncontrolled with hyperglycemia.   Patient was placed on insulin therapy for glucose cover and monitoring.   Continue with ezetimibe.   Coronary artery disease involving native coronary artery of native heart without angina pectoris Continue blood pressure control, and ezetimibe    Mood disorder (HCC) Cognitive impairment.  He had suicidal ideation. Patient was evaluated by psychiatry, and  recommended continue medical therapy. He had no longer suicidal thoughts.  - Continue Lexapro  Liver lesion CTA on admission showed radiographic indications of cirrhosis and known right heart failure as well as 2.3 cm indeterminate lesion. Follow up as outpatient.   Acute urinary retention Overnight 5/20, requiring repeated I/O cath, >1L return both times. Patient with foley catheter in place, will need outpatient voiding trial.  Added Flomax.          Consultants: cardiology  Procedures performed: none   Disposition: Home Diet recommendation:  Discharge Diet Orders (From admission, onward)     Start     Ordered   03/21/23 0000  Diet - low sodium heart healthy        03/21/23 0904           Cardiac and Carb modified diet DISCHARGE MEDICATION: Allergies as of 03/21/2023       Reactions   Amoxicillin Hives   Cephalexin Itching   Lipitor [atorvastatin] Other (See Comments)   Damage to calf muscles   Statins Other (See Comments)   Muscle pain   Vytorin [ezetimibe-simvastatin] Other (See Comments)   Damage to calf muscles        Medication List     STOP taking these medications    carvedilol 3.125 MG tablet Commonly known as: COREG   clopidogrel 75 MG tablet Commonly known as: PLAVIX   [employer] Gas Relief Extra Strength 125 MG chewable tablet Generic drug: simethicone   metFORMIN 500 MG tablet Commonly known as: GLUCOPHAGE       TAKE these medications  acetaminophen 325 MG tablet Commonly known as: TYLENOL Take 2 tablets (650 mg total) by mouth every 4 (four) hours as needed for mild pain (or temp > 37.5 C (99.5 F)).   albuterol 108 (90 Base) MCG/ACT inhaler Commonly known as: VENTOLIN HFA Inhale 2 puffs into the lungs every 6 (six) hours as needed for wheezing or shortness of breath.   amiodarone 200 MG tablet Commonly known as: PACERONE Take 1 tablet (200 mg total) by mouth 2 (two) times daily.   apixaban 5 MG Tabs tablet Commonly known  as: ELIQUIS Take 1 tablet (5 mg total) by mouth 2 (two) times daily.   cholecalciferol 1000 units tablet Commonly known as: VITAMIN D Take 1 tablet (1,000 Units total) by mouth daily.   escitalopram 20 MG tablet Commonly known as: Lexapro Take 1 tablet (20 mg total) by mouth daily.   esomeprazole 40 MG capsule Commonly known as: NEXIUM TAKE 1 CAPSULE BY MOUTH DAILY AS NEEDED FOR HEARTBURN   ezetimibe 10 MG tablet Commonly known as: ZETIA Take 1 tablet (10 mg total) by mouth daily.   furosemide 40 MG tablet Commonly known as: LASIX Take 1 tablet (40 mg total) by mouth daily.   hydrOXYzine 10 MG tablet Commonly known as: ATARAX Take 1 tablet (10 mg total) by mouth 2 (two) times daily as needed for itching or anxiety.   olopatadine 0.1 % ophthalmic solution Commonly known as: PATANOL INSTILL 1 DROP INTO BOTH EYS DAILY AS NEEDED FOR ALLERGIES   ondansetron 4 MG disintegrating tablet Commonly known as: ZOFRAN-ODT TAKE 2 TABLETS BY MOUTH AS NEEDED   potassium chloride 10 MEQ tablet Commonly known as: KLOR-CON M Take 1 tablet (10 mEq total) by mouth daily.   rosuvastatin 40 MG tablet Commonly known as: CRESTOR Take 1 tablet (40 mg total) by mouth daily.   spironolactone 25 MG tablet Commonly known as: ALDACTONE Take 1/2 tablet (12.5 mg total) by mouth daily.   tamsulosin 0.4 MG Caps capsule Commonly known as: FLOMAX Take 1 capsule (0.4 mg total) by mouth daily after supper.   traZODone 50 MG tablet Commonly known as: DESYREL Take 0.5-1 tablets (25-50 mg total) by mouth at bedtime as needed for sleep.   triamcinolone cream 0.1 % Commonly known as: KENALOG 1 application Externally once a day, mix w/ itch relief lotion for 30 days               Durable Medical Equipment  (From admission, onward)           Start     Ordered   03/20/23 1413  For home use only DME oxygen  Once       Comments: Please evaluate for lightweight POC  Question Answer Comment   Length of Need Lifetime   Mode or (Route) Nasal cannula   Liters per Minute 2   Frequency Continuous (stationary and portable oxygen unit needed)   Oxygen delivery system Gas      03/20/23 1412   03/11/23 1425  For home use only DME 4 wheeled rolling walker with seat  Once       Question Answer Comment  Patient needs a walker to treat with the following condition Heart failure [facility])   Patient needs a walker to treat with the following condition Pulmonary embolism (HCC)      03/11/23 1425            Follow-up Information     [facility] Follow  up on 03/25/2023.   Specialty: Cardiology Why: Advanced Heart Failure Clinic 10:30 AM Entrance C, Free Valet Parking Please bring all meds to appointment Contact information: [address] [id number] mc [place] [address] [phone]        [facility] Follow up.   Specialty: PALLIATIVE CARE Why: Outpatient Palliative Services Contact information: [address] [facility] [id number] [phone]        [facility] Follow up.   Specialty: Home Health Services Why: Home Health RN, physical therapy-agency will call with appt time Contact information: [address] [place] [address] [phone]                Discharge Exam: Filed Weights   03/19/23 0456 03/20/23 0706 03/21/23 0600  Weight: 66.2 kg 66.3 kg 66 kg   BP 103/70 (BP Location: Left Arm)   Pulse 83   Temp 97.6 F (36.4 C) (Oral)   Resp 18   Ht 5\' 6"  (1.676 m)   Wt 66 kg   SpO2 95%   BMI 23.48 kg/m   Patient is feeling well, he is very anxious about going home, he ambulated with walker and supervision this morning.   Neurology awake and alert ENT with mild pallor Cardiovascular with S1 and S2 present, irregularly irregular with no gallops, rubs or murmurs No JVD No lower extremity edema Respiratory with no rales  or wheezing Abdomen with no distention   Condition at discharge: stable  The results of significant diagnostics from this hospitalization (including imaging, microbiology, ancillary and laboratory) are listed below for reference.   Imaging Studies: [place] EKG SITE RITE  Result Date: 03/09/2023 If Site Rite image not attached, placement could not be confirmed due to current cardiac rhythm.  ECHOCARDIOGRAM COMPLETE  Result Date: 03/07/2023    ECHOCARDIOGRAM REPORT   Patient Name:   ISSAI KRUS Date of Exam: 03/07/2023 Medical Rec #:  [id number]     Height:       66.0 in Accession #:    [id number]    Weight:       156.6 lb Date of Birth:  Aug 22, 1941     BSA:          1.802 m Patient Age:    81 years      BP:           102/87 mmHg Patient Gender: M             HR:           87 bpm. Exam Location:  Inpatient Procedure: 2D Echo, Cardiac Doppler and Color Doppler  Results communicated to Dr Maryfrances Bunnell at 18:36 on 03/07/23. Indications:    dilated cardiomyopathy  History:        Patient has prior history of Echocardiogram examinations, most                 recent 02/26/2020. CHF, CAD; Risk Factors:Diabetes and                 Dyslipidemia.  Sonographer:    Delcie Roch RDCS Referring Phys: [id number] Vinnie Level SMITH IMPRESSIONS  1. Left ventricular ejection fraction, by estimation, is <20%. The left ventricle has severely decreased function. The left ventricle demonstrates global hypokinesis. The left ventricular internal cavity size was mildly dilated. There is mild left ventricular hypertrophy. Left ventricular diastolic parameters are indeterminate.  2. Right ventricular systolic function is moderately reduced. The right ventricular size is normal.  Tricuspid regurgitation signal is inadequate for assessing PA pressure.  3. Left atrial size was mildly dilated.  4. The mitral valve is degenerative. Mild to moderate mitral valve regurgitation. Moderate mitral annular calcification.  5. The aortic valve is tricuspid.  There is moderate calcification of the aortic valve. Aortic valve regurgitation is trivial. MG across AV only , but visually appears stenotic and AVA 1.0cm^2 and DI (0.26) suggest moderate AS. Very low SV index (14  cc/m^2), suspect low flow low gradient moderate AS  6. Aortic dilatation noted. There is dilatation of the ascending aorta, measuring 41 mm.  7. The inferior vena cava is dilated in size with <50% respiratory variability, suggesting right atrial pressure of 15 mmHg. FINDINGS  Left Ventricle: Left ventricular ejection fraction, by estimation, is <20%. The left ventricle has severely decreased function. The left ventricle demonstrates global hypokinesis. The left ventricular internal cavity size was mildly dilated. There is mild left ventricular hypertrophy. Left ventricular diastolic parameters are indeterminate. Right Ventricle: The right ventricular size is normal. Right vetricular wall thickness was not well visualized. Right ventricular systolic function is moderately reduced. Tricuspid regurgitation signal is inadequate for assessing PA pressure. Left Atrium: Left atrial size was mildly dilated. Right Atrium: Right atrial size was normal in size. Pericardium: There is no evidence of pericardial effusion. Mitral Valve: The mitral valve is degenerative in appearance. Moderate mitral annular calcification. Mild to moderate mitral valve regurgitation. Tricuspid Valve: The tricuspid valve is normal in structure. Tricuspid valve regurgitation is trivial. Aortic Valve: The aortic valve is tricuspid. There is moderate calcification of the aortic valve. Aortic valve regurgitation is trivial. Aortic valve mean gradient measures 4.5 mmHg. Aortic valve peak gradient measures 8.3 mmHg. Aortic valve area, by VTI  measures 1.19 cm. Pulmonic Valve: The pulmonic valve was not well visualized. Pulmonic valve regurgitation is trivial. Aorta: Aortic dilatation noted and the aortic root is normal in size and  structure. There is dilatation of the ascending aorta, measuring 41 mm. Venous: The inferior vena cava is dilated in size with less than 50% respiratory variability, suggesting right atrial pressure of 15 mmHg. IAS/Shunts: The interatrial septum was not well visualized.  LEFT VENTRICLE PLAX 2D LVIDd:         5.80 cm LVIDs:         5.20 cm LV PW:         1.20 cm LV IVS:        1.20 cm LVOT diam:     2.30 cm LV SV:         30 LV SV Index:   16 LVOT Area:     4.15 cm  RIGHT VENTRICLE            IVC RV Basal diam:  2.90 cm    IVC diam: 2.20 cm RV S prime:     6.31 cm/s TAPSE (M-mode): 1.2 cm LEFT ATRIUM             Index        RIGHT ATRIUM           Index LA diam:        4.10 cm 2.27 cm/m   RA Area:     19.30 cm LA Vol (A2C):   63.9 ml 35.45 ml/m  RA Volume:   53.90 ml  29.91 ml/m LA Vol (A4C):   62.5 ml 34.68 ml/m LA Biplane Vol: 64.5 ml 35.79 ml/m  AORTIC VALVE AV Area (Vmax):    1.37 cm AV  Area (Vmean):   1.26 cm AV Area (VTI):     1.19 cm AV Vmax:           144.00 cm/s AV Vmean:          96.100 cm/s AV VTI:            0.250 m AV Peak Grad:      8.3 mmHg AV Mean Grad:      4.5 mmHg LVOT Vmax:         47.57 cm/s LVOT Vmean:        29.133 cm/s LVOT VTI:          0.071 m LVOT/AV VTI ratio: 0.29  AORTA Ao Root diam: 3.60 cm Ao Asc diam:  4.10 cm MR Peak grad:    53.6 mmHg MR Mean grad:    34.0 mmHg    SHUNTS MR Vmax:         366.00 cm/s  Systemic VTI:  0.07 m MR Vmean:        276.0 cm/s   Systemic Diam: 2.30 cm MR PISA:         1.01 cm MR PISA Eff ROA: 11 mm MR PISA Radius:  0.40 cm Epifanio Lesches MD Electronically signed by Epifanio Lesches MD Signature Date/Time: 03/07/2023/6:37:46 PM    Final    CT Angio Chest PE W and/or Wo Contrast  Result Date: 03/07/2023 CLINICAL DATA:  Shortness of breath.  Elevated D-dimer. EXAM: CT ANGIOGRAPHY CHEST WITH CONTRAST TECHNIQUE: Multidetector CT imaging of the chest was performed using the standard protocol during bolus administration of intravenous  contrast. Multiplanar CT image reconstructions and MIPs were obtained to evaluate the vascular anatomy. RADIATION DOSE REDUCTION: This exam was performed according to the departmental dose-optimization program which includes automated exposure control, adjustment of the mA and/or kV according to patient size and/or use of iterative reconstruction technique. CONTRAST:  75mL OMNIPAQUE IOHEXOL 350 MG/ML SOLN COMPARISON:  11/15/2016 FINDINGS: Cardiovascular: Pulmonary emboli are seen in the central right upper lobe pulmonary artery and segmental branches, an anterior segmental branch in the right lower lobe pulmonary artery, and posterior segmental branches of the left lower lobe pulmonary artery. No evidence of elevated RV/LV ratio. No evidence of thoracic aortic dissection or aneurysm. Contrast reflux into IVC and hepatic veins is consistent with right heart insufficiency. Mediastinum/Nodes: No masses or pathologically enlarged lymph nodes identified. Lungs/Pleura: Small pleural effusions are seen bilaterally with mild dependent atelectasis. No evidence of pulmonary consolidation or mass. Upper abdomen: Hepatic cirrhosis noted. 2.3 cm low-attenuation lesion is seen in the lateral segment of the left hepatic lobe, which cannot be characterized on this study. Musculoskeletal: No suspicious bone lesions identified. Review of the MIP images confirms the above findings. IMPRESSION: Bilateral pulmonary embolism, as described above. No CT evidence of right heart strain. Small bilateral pleural effusions and mild dependent atelectasis. Hepatic cirrhosis. Findings consistent with right heart insufficiency. 2.3 cm low-attenuation lesion in left hepatic lobe, which cannot be characterized on this study. Recommend abdomen MRI without and with contrast for further characterization. Critical Value/emergent results were called by telephone at the time of interpretation on 03/07/2023 at 12:53 pm to provider Ernie Avena , who  verbally acknowledged these results. Electronically Signed   By: Danae Orleans M.D.   On: 03/07/2023 12:57   DG Chest Portable 1 View  Result Date: 03/07/2023 CLINICAL DATA:  Shortness of breath EXAM: PORTABLE CHEST 1 VIEW COMPARISON:  02/24/2020 FINDINGS: Cardiomegaly, vascular congestion. Mild perihilar and infrahilar airspace  opacities, likely edema. No effusions or acute bony abnormality. IMPRESSION: Mild CHF. Electronically Signed   By: Charlett Nose M.D.   On: 03/07/2023 10:13    Microbiology: Results for orders placed or performed during the hospital encounter of 03/07/23  SARS Coronavirus 2 by RT PCR (hospital order, performed in [facility] hospital lab) *cepheid single result test* Anterior Nasal Swab     Status: None   Collection Time: 03/07/23  9:46 AM   Specimen: Anterior Nasal Swab  Result Value Ref Range Status   SARS Coronavirus 2 by RT PCR NEGATIVE NEGATIVE Final    Comment: Performed at [facility], [address], [place], [place] [address]    Labs: CBC: Recent Labs  Lab 03/15/23 0516  WBC 7.7  HGB 12.4*  HCT 40.4  MCV 91.4  PLT 175   Basic Metabolic Panel: Recent Labs  Lab 03/15/23 0516 03/16/23 0416 03/19/23 0023 03/21/23 0018  NA 135 136 136 136  K 3.7 3.7 3.6 4.0  CL 96* 99 100 98  CO2 29 27 26 25   GLUCOSE 122* 111* 125* 109*  BUN 17 15 13 21   CREATININE 1.23 1.07 1.08 1.27*  CALCIUM 7.8* 8.0* 8.0* 8.3*  MG 2.0  --   --   --   PHOS  --   --  3.5  --    Liver Function Tests: Recent Labs  Lab 03/19/23 0023  ALBUMIN 2.8*   CBG: Recent Labs  Lab 03/20/23 0632 03/20/23 1117 03/20/23 1602 03/20/23 2101 03/21/23 0555  GLUCAP 118* 214* 162* 127* 94    Discharge time spent: greater than 30 minutes.  Signed: Coralie Keens, MD [facility] 03/21/2023

## 2023-03-22 ENCOUNTER — Emergency Department (HOSPITAL_COMMUNITY): Payer: Medicare Other

## 2023-03-22 ENCOUNTER — Encounter (HOSPITAL_COMMUNITY): Payer: Self-pay

## 2023-03-22 ENCOUNTER — Inpatient Hospital Stay (HOSPITAL_COMMUNITY)
Admission: EM | Admit: 2023-03-22 | Discharge: 2023-03-27 | DRG: 948 | Disposition: A | Discharge status: Skilled Nursing Facility | Payer: Medicare Other | Attending: Family Medicine | Admitting: Family Medicine

## 2023-03-22 ENCOUNTER — Other Ambulatory Visit: Payer: Self-pay

## 2023-03-22 DIAGNOSIS — I959 Hypotension, unspecified: Secondary | ICD-10-CM | POA: Diagnosis present

## 2023-03-22 DIAGNOSIS — I480 Paroxysmal atrial fibrillation: Secondary | ICD-10-CM | POA: Diagnosis present

## 2023-03-22 DIAGNOSIS — R319 Hematuria, unspecified: Secondary | ICD-10-CM | POA: Diagnosis present

## 2023-03-22 DIAGNOSIS — Y92009 Unspecified place in unspecified non-institutional (private) residence as the place of occurrence of the external cause: Secondary | ICD-10-CM | POA: Diagnosis not present

## 2023-03-22 DIAGNOSIS — R339 Retention of urine, unspecified: Secondary | ICD-10-CM | POA: Diagnosis present

## 2023-03-22 DIAGNOSIS — N368 Other specified disorders of urethra: Secondary | ICD-10-CM | POA: Diagnosis present

## 2023-03-22 DIAGNOSIS — E876 Hypokalemia: Secondary | ICD-10-CM | POA: Diagnosis present

## 2023-03-22 DIAGNOSIS — Z7901 Long term (current) use of anticoagulants: Secondary | ICD-10-CM

## 2023-03-22 DIAGNOSIS — H9190 Unspecified hearing loss, unspecified ear: Secondary | ICD-10-CM | POA: Diagnosis present

## 2023-03-22 DIAGNOSIS — Z888 Allergy status to other drugs, medicaments and biological substances status: Secondary | ICD-10-CM

## 2023-03-22 DIAGNOSIS — W19XXXA Unspecified fall, initial encounter: Secondary | ICD-10-CM | POA: Diagnosis not present

## 2023-03-22 DIAGNOSIS — I5022 Chronic systolic (congestive) heart failure: Secondary | ICD-10-CM | POA: Diagnosis present

## 2023-03-22 DIAGNOSIS — I272 Pulmonary hypertension, unspecified: Secondary | ICD-10-CM | POA: Diagnosis present

## 2023-03-22 DIAGNOSIS — I428 Other cardiomyopathies: Secondary | ICD-10-CM | POA: Diagnosis present

## 2023-03-22 DIAGNOSIS — E785 Hyperlipidemia, unspecified: Secondary | ICD-10-CM | POA: Diagnosis present

## 2023-03-22 DIAGNOSIS — Z823 Family history of stroke: Secondary | ICD-10-CM

## 2023-03-22 DIAGNOSIS — Z7409 Other reduced mobility: Secondary | ICD-10-CM | POA: Diagnosis present

## 2023-03-22 DIAGNOSIS — I110 Hypertensive heart disease with heart failure: Secondary | ICD-10-CM | POA: Diagnosis present

## 2023-03-22 DIAGNOSIS — W010XXA Fall on same level from slipping, tripping and stumbling without subsequent striking against object, initial encounter: Secondary | ICD-10-CM | POA: Diagnosis present

## 2023-03-22 DIAGNOSIS — R45851 Suicidal ideations: Secondary | ICD-10-CM | POA: Diagnosis present

## 2023-03-22 DIAGNOSIS — R338 Other retention of urine: Secondary | ICD-10-CM | POA: Diagnosis present

## 2023-03-22 DIAGNOSIS — Z841 Family history of disorders of kidney and ureter: Secondary | ICD-10-CM

## 2023-03-22 DIAGNOSIS — I10 Essential (primary) hypertension: Secondary | ICD-10-CM | POA: Diagnosis not present

## 2023-03-22 DIAGNOSIS — G3184 Mild cognitive impairment, so stated: Secondary | ICD-10-CM | POA: Diagnosis present

## 2023-03-22 DIAGNOSIS — R531 Weakness: Secondary | ICD-10-CM | POA: Diagnosis not present

## 2023-03-22 DIAGNOSIS — Z91048 Other nonmedicinal substance allergy status: Secondary | ICD-10-CM

## 2023-03-22 DIAGNOSIS — R630 Anorexia: Secondary | ICD-10-CM | POA: Diagnosis present

## 2023-03-22 DIAGNOSIS — F32A Depression, unspecified: Secondary | ICD-10-CM | POA: Diagnosis present

## 2023-03-22 DIAGNOSIS — Z79899 Other long term (current) drug therapy: Secondary | ICD-10-CM

## 2023-03-22 DIAGNOSIS — S37829A Unspecified injury of prostate, initial encounter: Secondary | ICD-10-CM | POA: Diagnosis present

## 2023-03-22 DIAGNOSIS — Z880 Allergy status to penicillin: Secondary | ICD-10-CM

## 2023-03-22 DIAGNOSIS — R627 Adult failure to thrive: Secondary | ICD-10-CM | POA: Diagnosis present

## 2023-03-22 DIAGNOSIS — Z881 Allergy status to other antibiotic agents status: Secondary | ICD-10-CM

## 2023-03-22 DIAGNOSIS — Z8673 Personal history of transient ischemic attack (TIA), and cerebral infarction without residual deficits: Secondary | ICD-10-CM

## 2023-03-22 DIAGNOSIS — T8383XA Hemorrhage of genitourinary prosthetic devices, implants and grafts, initial encounter: Secondary | ICD-10-CM | POA: Diagnosis present

## 2023-03-22 DIAGNOSIS — Z8249 Family history of ischemic heart disease and other diseases of the circulatory system: Secondary | ICD-10-CM

## 2023-03-22 DIAGNOSIS — E1169 Type 2 diabetes mellitus with other specified complication: Secondary | ICD-10-CM | POA: Diagnosis present

## 2023-03-22 DIAGNOSIS — F419 Anxiety disorder, unspecified: Secondary | ICD-10-CM | POA: Diagnosis present

## 2023-03-22 DIAGNOSIS — I2699 Other pulmonary embolism without acute cor pulmonale: Secondary | ICD-10-CM | POA: Diagnosis present

## 2023-03-22 DIAGNOSIS — Z86711 Personal history of pulmonary embolism: Secondary | ICD-10-CM

## 2023-03-22 DIAGNOSIS — Z515 Encounter for palliative care: Secondary | ICD-10-CM

## 2023-03-22 DIAGNOSIS — Z66 Do not resuscitate: Secondary | ICD-10-CM | POA: Diagnosis present

## 2023-03-22 DIAGNOSIS — I2510 Atherosclerotic heart disease of native coronary artery without angina pectoris: Secondary | ICD-10-CM | POA: Diagnosis present

## 2023-03-22 LAB — I-STAT ARTERIAL BLOOD GAS, ED
Acid-Base Excess: 2 mmol/L (ref 0.0–2.0)
Bicarbonate: 26.5 mmol/L (ref 20.0–28.0)
Calcium, Ion: 1.12 mmol/L — ABNORMAL LOW (ref 1.15–1.40)
HCT: 32 % — ABNORMAL LOW (ref 39.0–52.0)
Hemoglobin: 10.9 g/dL — ABNORMAL LOW (ref 13.0–17.0)
O2 Saturation: 91 %
Patient temperature: 96.4
Potassium: 4 mmol/L (ref 3.5–5.1)
Sodium: 136 mmol/L (ref 135–145)
TCO2: 28 mmol/L (ref 22–32)
pCO2 arterial: 39.1 mmHg (ref 32–48)
pH, Arterial: 7.434 (ref 7.35–7.45)
pO2, Arterial: 55 mmHg — ABNORMAL LOW (ref 83–108)

## 2023-03-22 LAB — CBC WITH DIFFERENTIAL/PLATELET
Abs Immature Granulocytes: 0.03 10*3/uL (ref 0.00–0.07)
Basophils Absolute: 0.1 10*3/uL (ref 0.0–0.1)
Basophils Relative: 1 %
Eosinophils Absolute: 0.1 10*3/uL (ref 0.0–0.5)
Eosinophils Relative: 2 %
HCT: 40.2 % (ref 39.0–52.0)
Hemoglobin: 12.5 g/dL — ABNORMAL LOW (ref 13.0–17.0)
Immature Granulocytes: 0 %
Lymphocytes Relative: 11 %
Lymphs Abs: 0.9 10*3/uL (ref 0.7–4.0)
MCH: 28.2 pg (ref 26.0–34.0)
MCHC: 31.1 g/dL (ref 30.0–36.0)
MCV: 90.7 fL (ref 80.0–100.0)
Monocytes Absolute: 0.6 10*3/uL (ref 0.1–1.0)
Monocytes Relative: 8 %
Neutro Abs: 5.9 10*3/uL (ref 1.7–7.7)
Neutrophils Relative %: 78 %
Platelets: 273 10*3/uL (ref 150–400)
RBC: 4.43 MIL/uL (ref 4.22–5.81)
RDW: 14.4 % (ref 11.5–15.5)
WBC: 7.6 10*3/uL (ref 4.0–10.5)
nRBC: 0 % (ref 0.0–0.2)

## 2023-03-22 LAB — COMPREHENSIVE METABOLIC PANEL
ALT: 20 U/L (ref 0–44)
AST: 28 U/L (ref 15–41)
Albumin: 3.5 g/dL (ref 3.5–5.0)
Alkaline Phosphatase: 38 U/L (ref 38–126)
Anion gap: 14 (ref 5–15)
BUN: 23 mg/dL (ref 8–23)
CO2: 24 mmol/L (ref 22–32)
Calcium: 9 mg/dL (ref 8.9–10.3)
Chloride: 96 mmol/L — ABNORMAL LOW (ref 98–111)
Creatinine, Ser: 1.47 mg/dL — ABNORMAL HIGH (ref 0.61–1.24)
GFR, Estimated: 47 mL/min — ABNORMAL LOW (ref >60)
Glucose, Bld: 117 mg/dL — ABNORMAL HIGH (ref 70–99)
Potassium: 4.6 mmol/L (ref 3.5–5.1)
Sodium: 134 mmol/L — ABNORMAL LOW (ref 135–145)
Total Bilirubin: 1.5 mg/dL — ABNORMAL HIGH (ref 0.3–1.2)
Total Protein: 6.8 g/dL (ref 6.5–8.1)

## 2023-03-22 LAB — CBG MONITORING, ED: Glucose-Capillary: 107 mg/dL — ABNORMAL HIGH (ref 70–99)

## 2023-03-22 LAB — TROPONIN I (HIGH SENSITIVITY)
Troponin I (High Sensitivity): 43 ng/L — ABNORMAL HIGH (ref <18)
Troponin I (High Sensitivity): 37 ng/L — ABNORMAL HIGH (ref <18)

## 2023-03-22 LAB — CK: Total CK: 87 U/L (ref 49–397)

## 2023-03-22 LAB — LACTIC ACID, PLASMA
Lactic Acid, Venous: 2.1 mmol/L (ref 0.5–1.9)
Lactic Acid, Venous: 1.6 mmol/L (ref 0.5–1.9)

## 2023-03-22 MED ORDER — PANTOPRAZOLE SODIUM 40 MG PO TBEC
40.0000 mg | DELAYED_RELEASE_TABLET | Freq: Every day | ORAL | Status: DC
  Administered 2023-03-24 – 2023-03-27 (×4): 40 mg via ORAL
  Filled 2023-03-22 (×5): qty 1

## 2023-03-22 MED ORDER — AMIODARONE HCL 200 MG PO TABS
200.0000 mg | ORAL_TABLET | Freq: Two times a day (BID) | ORAL | Status: DC
  Administered 2023-03-22 – 2023-03-27 (×7): 200 mg via ORAL
  Filled 2023-03-22 (×9): qty 1

## 2023-03-22 MED ORDER — APIXABAN 5 MG PO TABS
5.0000 mg | ORAL_TABLET | Freq: Two times a day (BID) | ORAL | Status: DC
  Filled 2023-03-22 (×3): qty 1

## 2023-03-22 MED ORDER — ESCITALOPRAM OXALATE 10 MG PO TABS
20.0000 mg | ORAL_TABLET | Freq: Every day | ORAL | Status: DC
  Administered 2023-03-22 – 2023-03-26 (×4): 20 mg via ORAL
  Filled 2023-03-22 (×6): qty 2

## 2023-03-22 MED ORDER — SPIRONOLACTONE 12.5 MG HALF TABLET
12.5000 mg | ORAL_TABLET | Freq: Every day | ORAL | Status: DC
  Administered 2023-03-23: 12.5 mg via ORAL
  Filled 2023-03-22: qty 1

## 2023-03-22 MED ORDER — ONDANSETRON HCL 4 MG PO TABS
4.0000 mg | ORAL_TABLET | Freq: Four times a day (QID) | ORAL | Status: DC | PRN
  Administered 2023-03-22 – 2023-03-26 (×4): 4 mg via ORAL
  Filled 2023-03-22 (×4): qty 1

## 2023-03-22 MED ORDER — FUROSEMIDE 10 MG/ML IJ SOLN
40.0000 mg | Freq: Two times a day (BID) | INTRAMUSCULAR | Status: AC
  Administered 2023-03-22 – 2023-03-23 (×2): 40 mg via INTRAVENOUS
  Filled 2023-03-22 (×2): qty 4

## 2023-03-22 MED ORDER — TAMSULOSIN HCL 0.4 MG PO CAPS
0.4000 mg | ORAL_CAPSULE | Freq: Every day | ORAL | Status: DC
  Administered 2023-03-22 – 2023-03-26 (×5): 0.4 mg via ORAL
  Filled 2023-03-22 (×5): qty 1

## 2023-03-22 MED ORDER — ROSUVASTATIN CALCIUM 20 MG PO TABS
40.0000 mg | ORAL_TABLET | Freq: Every day | ORAL | Status: DC
  Administered 2023-03-24 – 2023-03-27 (×4): 40 mg via ORAL
  Filled 2023-03-22 (×5): qty 2

## 2023-03-22 MED ORDER — LIDOCAINE HCL URETHRAL/MUCOSAL 2 % EX GEL
1.0000 | Freq: Once | CUTANEOUS | Status: AC
  Administered 2023-03-22: 1 via TOPICAL
  Filled 2023-03-22: qty 11

## 2023-03-22 MED ORDER — ACETAMINOPHEN 325 MG PO TABS
650.0000 mg | ORAL_TABLET | Freq: Four times a day (QID) | ORAL | Status: DC | PRN
  Administered 2023-03-24: 650 mg via ORAL
  Filled 2023-03-22: qty 2

## 2023-03-22 MED ORDER — HYDRALAZINE HCL 20 MG/ML IJ SOLN: 5.0000 mg | INTRAMUSCULAR | Status: DC | PRN

## 2023-03-22 MED ORDER — BISACODYL 5 MG PO TBEC: 5.0000 mg | DELAYED_RELEASE_TABLET | Freq: Every day | ORAL | Status: DC | PRN

## 2023-03-22 MED ORDER — POLYETHYLENE GLYCOL 3350 17 G PO PACK: 17.0000 g | PACK | Freq: Every day | ORAL | Status: DC | PRN

## 2023-03-22 MED ORDER — EZETIMIBE 10 MG PO TABS
10.0000 mg | ORAL_TABLET | Freq: Every day | ORAL | Status: DC
  Administered 2023-03-24 – 2023-03-27 (×4): 10 mg via ORAL
  Filled 2023-03-22 (×5): qty 1

## 2023-03-22 MED ORDER — DOCUSATE SODIUM 100 MG PO CAPS
100.0000 mg | ORAL_CAPSULE | Freq: Two times a day (BID) | ORAL | Status: DC
  Administered 2023-03-22 – 2023-03-27 (×8): 100 mg via ORAL
  Filled 2023-03-22 (×10): qty 1

## 2023-03-22 MED ORDER — ALBUTEROL SULFATE HFA 108 (90 BASE) MCG/ACT IN AERS: 2.0000 | INHALATION_SPRAY | Freq: Four times a day (QID) | RESPIRATORY_TRACT | Status: DC | PRN

## 2023-03-22 MED ORDER — ALBUTEROL SULFATE (2.5 MG/3ML) 0.083% IN NEBU: 2.5000 mg | INHALATION_SOLUTION | Freq: Four times a day (QID) | RESPIRATORY_TRACT | Status: DC | PRN

## 2023-03-22 MED ORDER — ACETAMINOPHEN 650 MG RE SUPP: 650.0000 mg | Freq: Four times a day (QID) | RECTAL | Status: DC | PRN

## 2023-03-22 MED ORDER — ONDANSETRON HCL 4 MG/2ML IJ SOLN
4.0000 mg | Freq: Four times a day (QID) | INTRAMUSCULAR | Status: DC | PRN
  Administered 2023-03-24 – 2023-03-25 (×2): 4 mg via INTRAVENOUS
  Filled 2023-03-22 (×3): qty 2

## 2023-03-22 MED ORDER — FUROSEMIDE 40 MG PO TABS: 40.0000 mg | ORAL_TABLET | Freq: Every day | ORAL | Status: DC

## 2023-03-22 NOTE — ED Notes (Signed)
Christian [name] (Friend) called asking for a update on Christian [name]. Her number is [phone]

## 2023-03-22 NOTE — ED Notes (Signed)
ED TO INPATIENT HANDOFF REPORT  ED Nurse Name and Phone #: Steward Drone x  S Name/Age/Gender Christian [name] 82 y.o. male Room/Bed: TRACC/TRACC  Code Status   Code Status: DNR  Home/SNF/Other Home Patient oriented to: self, place, and time Is this baseline? No   Triage Complete: Triage complete  Chief Complaint Failure to thrive in adult [R62.7]  Triage Note Pt BIB EMS due to AMS. Pt usually axox4 and lives alone. Neighbor found pt on ground with blood all over him. Pt discharged yesterday.    Allergies Allergies  Allergen Reactions   Lipitor [Atorvastatin] Other (See Comments)    Damage to calf muscles   Vytorin [Ezetimibe-Simvastatin] Other (See Comments)    Damage to calf muscles   Amoxicillin Hives   Cephalexin Itching   Statins Other (See Comments)    Muscle pain    Tape Rash and Other (See Comments)    Broke out the skin    Level of Care/Admitting Diagnosis ED Disposition     ED Disposition  Admit   Condition  --   Comment  Hospital Area: [facility] [[id number]]  Level of Care: Med-Surg [16]  May place patient in observation at [facility] or [facility] if equivalent level of care is available:: No  Covid Evaluation: Asymptomatic - no recent exposure (last 10 days) testing not required  Diagnosis: Failure to thrive in adult [358490]  Admitting Physician: Jonah Blue [[id number]]  Attending Physician: Jonah Blue [[id number]]          B Medical/Surgery History Past Medical History:  Diagnosis Date   Allergic rhinitis due to pollen 03/29/2018   Chronic systolic heart failure (HCC) - EF improved to normal on Rx    Echo 11/18: Mild LVH, EF 20, diffuse HK mild AI, a sending aorta 42 mm (mildly dilated), mild MR, mild TR, trivial PI, PASP 44 // Echo 3/19: mild LVH, EF 25-30, diff HK, Gr 1 DD, asc aorta 43 mm (mildly dilated), MAC, mild reduced RVSF // Limited echo 10/19: Mild concentric LVH, EF 60-65, grade 1 diastolic dysfunction, MAC, mild LAE     Coronary artery disease involving native coronary artery of native heart without angina pectoris 11/11/2017   R/L HC 11/18: pLAD 45, oD1 65 ; elevated PCWP (mean 29), moderate pulmonary hypertension, CO 3.66, CI 2.07   Depression with anxiety 07/08/2017   Essential hypertension 07/08/2017   GERD with esophagitis 01/11/2018   Hernia of abdominal cavity    Hyperlipidemia LDL goal <130 05/25/2017   The 10-year ASCVD risk score Denman George DC Jr., et al., 2013) is: 31.1%   Values used to calculate the score:     Age: 63 years     Sex: Male     Is Non-Hispanic African American: No     Diabetic: No     Tobacco smoker: No     Systolic Blood Pressure: 140 mmHg     Is BP treated: No     HDL Cholesterol: 68.7 mg/dL     Total Cholesterol: 258 mg/dL    Idiopathic inflammatory myopathy 05/25/2017   Neuropathy involving both lower extremities 05/25/2017   Nonischemic cardiomyopathy (HCC) 11/11/2017   LHC 09/16/17-nonobstructive CAD   Oropharyngeal dysphagia 01/11/2018   Type 2 diabetes mellitus with complication, without long-term current use of insulin (HCC) 07/08/2017   Past Surgical History:  Procedure Laterality Date   BACK SURGERY     HERNIA REPAIR     RIGHT/LEFT HEART CATH AND CORONARY ANGIOGRAPHY N/A 09/16/2017  Procedure: RIGHT/LEFT HEART CATH AND CORONARY ANGIOGRAPHY;  Surgeon: [place], Peter M, MD;  Location: [facility] INVASIVE CV LAB;  Service: Cardiovascular;  Laterality: N/A;   SPINE SURGERY       A IV Location/Drains/Wounds Patient Lines/Drains/Airways Status     Active Line/Drains/Airways     Name Placement date Placement time Site Days   Peripheral IV 03/22/23 20 G 1.88" Anterior;Left Forearm 03/22/23  1119  Forearm  less than 1   Sheath 09/16/17 Left Venous;Brachial 09/16/17  1105  Venous;Brachial  2013            Intake/Output Last 24 hours No intake or output data in the 24 hours ending 03/22/23 1745  Labs/Imaging Results for orders placed or performed during the hospital  encounter of 03/22/23 (from the past 48 hour(s))  CBC with Differential     Status: Abnormal   Collection Time: 03/22/23 10:26 AM  Result Value Ref Range   WBC 7.6 4.0 - 10.5 K/uL   RBC 4.43 4.22 - 5.81 MIL/uL   Hemoglobin 12.5 (L) 13.0 - 17.0 g/dL   HCT 29.5 62.1 - 30.8 %   MCV 90.7 80.0 - 100.0 fL   MCH 28.2 26.0 - 34.0 pg   MCHC 31.1 30.0 - 36.0 g/dL   RDW 65.7 84.6 - 96.2 %   Platelets 273 150 - 400 K/uL   nRBC 0.0 0.0 - 0.2 %   Neutrophils Relative % 78 %   Neutro Abs 5.9 1.7 - 7.7 K/uL   Lymphocytes Relative 11 %   Lymphs Abs 0.9 0.7 - 4.0 K/uL   Monocytes Relative 8 %   Monocytes Absolute 0.6 0.1 - 1.0 K/uL   Eosinophils Relative 2 %   Eosinophils Absolute 0.1 0.0 - 0.5 K/uL   Basophils Relative 1 %   Basophils Absolute 0.1 0.0 - 0.1 K/uL   Immature Granulocytes 0 %   Abs Immature Granulocytes 0.03 0.00 - 0.07 K/uL    Comment: Performed at [facility], [address], [place], [place] [address]  Troponin I (High Sensitivity)     Status: Abnormal   Collection Time: 03/22/23 10:26 AM  Result Value Ref Range   Troponin I (High Sensitivity) 43 (H) <18 ng/L    Comment: (NOTE) Elevated high sensitivity troponin I (hsTnI) values and significant  changes across serial measurements may suggest ACS but many other  chronic and acute conditions are known to elevate hsTnI results.  Refer to the "Links" section for chest pain algorithms and additional  guidance. Performed at [facility], [address] [place], [place] [address]   Comprehensive metabolic panel     Status: Abnormal   Collection Time: 03/22/23 10:26 AM  Result Value Ref Range   Sodium 134 (L) 135 - 145 mmol/L   Potassium 4.6 3.5 - 5.1 mmol/L   Chloride 96 (L) 98 - 111 mmol/L   CO2 24 22 - 32 mmol/L   Glucose, Bld 117 (H) 70 - 99 mg/dL    Comment: Glucose reference range applies only to samples taken after fasting for at least 8 hours.   BUN 23 8 - 23 mg/dL   Creatinine, Ser 0.10 (H) 0.61 - 1.24  mg/dL   Calcium 9.0 8.9 - 27.2 mg/dL   Total Protein 6.8 6.5 - 8.1 g/dL   Albumin 3.5 3.5 - 5.0 g/dL   AST 28 15 - 41 U/L   ALT 20 0 - 44 U/L   Alkaline Phosphatase 38 38 - 126 U/L  Total Bilirubin 1.5 (H) 0.3 - 1.2 mg/dL   GFR, Estimated 47 (L) >60 mL/min    Comment: (NOTE) Calculated using the CKD-EPI Creatinine Equation (2021)    Anion gap 14 5 - 15    Comment: Performed at [facility], [address], [place], [place] [address]  CK     Status: None   Collection Time: 03/22/23 10:26 AM  Result Value Ref Range   Total CK 87 49 - 397 U/L    Comment: Performed at [facility], [address], [place], [place] [address]  Lactic acid, plasma     Status: Abnormal   Collection Time: 03/22/23 10:39 AM  Result Value Ref Range   Lactic Acid, Venous 2.1 (HH) 0.5 - 1.9 mmol/L    Comment: CRITICAL RESULT CALLED TO, READ BACK BY AND VERIFIED WITH V,VALDEZ RN @1117  03/22/23 E,BENTON Performed at [facility], [address] [place], [place] [address]   CBG monitoring, ED     Status: Abnormal   Collection Time: 03/22/23 10:42 AM  Result Value Ref Range   Glucose-Capillary 107 (H) 70 - 99 mg/dL    Comment: Glucose reference range applies only to samples taken after fasting for at least 8 hours.  Lactic acid, plasma     Status: None   Collection Time: 03/22/23 12:31 PM  Result Value Ref Range   Lactic Acid, Venous 1.6 0.5 - 1.9 mmol/L    Comment: Performed at [facility], [address], [place], [place] [address]  Troponin I (High Sensitivity)     Status: Abnormal   Collection Time: 03/22/23 12:31 PM  Result Value Ref Range   Troponin I (High Sensitivity) 37 (H) <18 ng/L    Comment: (NOTE) Elevated high sensitivity troponin I (hsTnI) values and significant  changes across serial measurements may suggest ACS but many other  chronic and acute conditions are known to elevate hsTnI results.  Refer to the "Links" section for chest pain algorithms and additional   guidance. Performed at [facility], [address], [place], [place] [address]   I-Stat arterial blood gas, ED     Status: Abnormal   Collection Time: 03/22/23  5:41 PM  Result Value Ref Range   pH, Arterial 7.434 7.35 - 7.45   pCO2 arterial 39.1 32 - 48 mmHg   pO2, Arterial 55 (L) 83 - 108 mmHg   Bicarbonate 26.5 20.0 - 28.0 mmol/L   TCO2 28 22 - 32 mmol/L   O2 Saturation 91 %   Acid-Base Excess 2.0 0.0 - 2.0 mmol/L   Sodium 136 135 - 145 mmol/L   Potassium 4.0 3.5 - 5.1 mmol/L   Calcium, Ion 1.12 (L) 1.15 - 1.40 mmol/L   HCT 32.0 (L) 39.0 - 52.0 %   Hemoglobin 10.9 (L) 13.0 - 17.0 g/dL   Patient temperature 57.8 F    Collection site RADIAL, ALLEN'S TEST ACCEPTABLE    Drawn by RT    Sample type ARTERIAL    CT Head Wo Contrast  Result Date: 03/22/2023 CLINICAL DATA:  Mental status change, unknown cause EXAM: CT HEAD WITHOUT CONTRAST TECHNIQUE: Contiguous axial images were obtained from the base of the skull through the vertex without intravenous contrast. RADIATION DOSE REDUCTION: This exam was performed according to the departmental dose-optimization program which includes automated exposure control, adjustment of the mA and/or kV according to patient size and/or use of iterative reconstruction technique. COMPARISON:  02/27/2020 FINDINGS: Brain: No evidence  of acute infarction, hemorrhage, hydrocephalus, extra-axial collection or mass lesion/mass effect. Encephalomalacia in the right occipital lobe compatible with remote infarct. Extensive low-density changes within the periventricular and subcortical white matter most compatible with chronic microvascular ischemic change. Mild diffuse cerebral volume loss. Vascular: Atherosclerotic calcifications involving the large vessels of the skull base. No unexpected hyperdense vessel. Skull: Normal. Negative for fracture or focal lesion. Sinuses/Orbits: No acute finding. Other: None. IMPRESSION: 1. No acute intracranial abnormality. 2.  Chronic microvascular ischemic change and cerebral volume loss. 3. Remote right occipital lobe infarct. Electronically Signed   By: Duanne Guess D.O.   On: 03/22/2023 11:58   DG Pelvis Portable  Result Date: 03/22/2023 CLINICAL DATA:  Trauma, fall EXAM: PORTABLE PELVIS 1-2 VIEWS COMPARISON:  None Available. FINDINGS: No displaced fracture or dislocation is seen. Joint spaces in both hips appear symmetrical. Degenerative changes are noted in visualized lower lumbar spine. IMPRESSION: No displaced fracture or dislocation is seen.  Lumbar spondylosis. Electronically Signed   By: Ernie Avena M.D.   On: 03/22/2023 11:09   DG Chest Port 1 View  Result Date: 03/22/2023 CLINICAL DATA:  Trauma, fall EXAM: PORTABLE CHEST 1 VIEW COMPARISON:  03/07/2023 FINDINGS: Transverse diameter of heart is increased. Increased interstitial markings are seen in the parahilar regions, lower lung fields. Increased markings are seen in right upper lung field. Left lateral CP angle is indistinct. There is no pneumothorax. IMPRESSION: Cardiomegaly. Increased interstitial markings are seen in both lungs suggesting interstitial edema or multifocal interstitial pneumonia. Small left pleural effusion. Electronically Signed   By: Ernie Avena M.D.   On: 03/22/2023 11:08    Pending Labs Unresulted Labs (From admission, onward)     Start     Ordered   03/23/23 0500  Basic metabolic panel  Tomorrow morning,   R        03/22/23 1732   03/23/23 0500  CBC  Tomorrow morning,   R        03/22/23 1732   03/22/23 1716  Blood gas, arterial (at [facility])  Once,   R        03/22/23 1715   03/22/23 1040  Culture, blood (single)  Once,   STAT        03/22/23 1039   03/22/23 1033  Brain natriuretic peptide  Once,   URGENT        03/22/23 1032   03/22/23 1027  Urinalysis, Routine w reflex microscopic -Urine, Clean Catch  Once,   URGENT       Question:  Specimen Source  Answer:  Urine, Clean Catch   03/22/23 1026             Vitals/Pain Today's Vitals   03/22/23 1600 03/22/23 1615 03/22/23 1700 03/22/23 1715  BP: 105/66 98/65 99/66  96/63  Pulse: 73 72 72 72  Resp: 17 20 (!) 21 20  Temp:      TempSrc:      SpO2: 100% 100% 93% (!) 87%  PainSc:        Isolation Precautions No active isolations  Medications Medications  amiodarone (PACERONE) tablet 200 mg (has no administration in time range)  ezetimibe (ZETIA) tablet 10 mg (has no administration in time range)  furosemide (LASIX) injection 40 mg (has no administration in time range)  furosemide (LASIX) tablet 40 mg (has no administration in time range)  rosuvastatin (CRESTOR) tablet 40 mg (has no administration in time range)  spironolactone (ALDACTONE) tablet 12.5 mg (has no administration in time  range)  escitalopram (LEXAPRO) tablet 20 mg (has no administration in time range)  pantoprazole (PROTONIX) EC tablet 40 mg (has no administration in time range)  tamsulosin (FLOMAX) capsule 0.4 mg (has no administration in time range)  apixaban (ELIQUIS) tablet 5 mg (has no administration in time range)  acetaminophen (TYLENOL) tablet 650 mg (has no administration in time range)    Or  acetaminophen (TYLENOL) suppository 650 mg (has no administration in time range)  docusate sodium (COLACE) capsule 100 mg (has no administration in time range)  polyethylene glycol (MIRALAX / GLYCOLAX) packet 17 g (has no administration in time range)  bisacodyl (DULCOLAX) EC tablet 5 mg (has no administration in time range)  ondansetron (ZOFRAN) tablet 4 mg (has no administration in time range)    Or  ondansetron (ZOFRAN) injection 4 mg (has no administration in time range)  hydrALAZINE (APRESOLINE) injection 5 mg (has no administration in time range)  albuterol (PROVENTIL) (2.5 MG/3ML) 0.083% nebulizer solution 2.5 mg (has no administration in time range)    Mobility walks with device     Focused Assessments         R Recommendations: See Admitting  Provider Note  Report given to:   Additional Notes:

## 2023-03-22 NOTE — H&P (Signed)
History and Physical    Patient: Christian [name] ZOX:[id number] DOB: July 04, 1941 DOA: 03/22/2023 DOS: the patient was seen and examined on 03/22/2023 PCP: [name], PA  Patient coming from: Home - lives alone; NOK: Tyqu[place], [place], [phone]   Chief Complaint: Fall  HPI: Christian [name] is a 82 y.o. male with medical history significant of chronic systolic CHF, CAD, HTN, HLD, and DM presenting with a fall, found down.   He was last admitted from 5/18-6/1 with acute on chronic systolic CHF, afib, and acute PE.  He was discharged yesterday.  He reports that his socks/shoes were slippery and he fell.  He tried to get up and was unable, his dog got involved and they ended up pulling out his foley.  He reports that he is fine living alone and does not need placement.  He denied any issues at all but coughed repeatedly at me.  He reports that he started coughing in the ER today, denies SOB.  I spoke with family.  His ex-wife reports that they were hoping to send him to SNF rehab.  Insurance said no because of his ambulation and sent him home yesterday.  She lives next door to him.  He came home with a catheter, which was a surprise to her (she is out of town).  Her SIL went to check on him last night and he was ok, went to check on him today and "there was blood everywhere."  He pulled out his foley and he was incoherent.  He was not coughing at home, to her knowledge.  He is DNR.  In review of the notes, [employer] would have required peer to peer and MD spoke with family and the plan was to dc to home and peer to peer was not pursued.   He had an ambulatory pulse ox and qualified for home O2, refused to wear it.  His ex-wife feels like he needs to come back in and really needs to be placed - she prefers [employer].    ER Course:  Left hospital yesterday, discharged to home and lives alone.  Tripped on foley this AM (ripped out), too weak to get up until a neighbor checked on him.  Has foley for  urinary retention, has not made urine.       Review of Systems: As mentioned in the history of present illness. All other systems reviewed and are negative. Past Medical History:  Diagnosis Date   Allergic rhinitis due to pollen 03/29/2018   Chronic systolic heart failure (HCC) - EF improved to normal on Rx    Echo 11/18: Mild LVH, EF 20, diffuse HK mild AI, a sending aorta 42 mm (mildly dilated), mild MR, mild TR, trivial PI, PASP 44 // Echo 3/19: mild LVH, EF 25-30, diff HK, Gr 1 DD, asc aorta 43 mm (mildly dilated), MAC, mild reduced RVSF // Limited echo 10/19: Mild concentric LVH, EF 60-65, grade 1 diastolic dysfunction, MAC, mild LAE    Coronary artery disease involving native coronary artery of native heart without angina pectoris 11/11/2017   R/L HC 11/18: pLAD 45, oD1 65 ; elevated PCWP (mean 29), moderate pulmonary hypertension, CO 3.66, CI 2.07   Depression with anxiety 07/08/2017   Essential hypertension 07/08/2017   GERD with esophagitis 01/11/2018   Hernia of abdominal cavity    Hyperlipidemia LDL goal <130 05/25/2017   The 10-year ASCVD risk score Denman George DC Jr., et al., 2013) is: 31.1%   Values used to calculate the score:  Age: 5 years     Sex: Male     Is Non-Hispanic African American: No     Diabetic: No     Tobacco smoker: No     Systolic Blood Pressure: 140 mmHg     Is BP treated: No     HDL Cholesterol: 68.7 mg/dL     Total Cholesterol: 258 mg/dL    Idiopathic inflammatory myopathy 05/25/2017   Neuropathy involving both lower extremities 05/25/2017   Nonischemic cardiomyopathy (HCC) 11/11/2017   LHC 09/16/17-nonobstructive CAD   Oropharyngeal dysphagia 01/11/2018   Type 2 diabetes mellitus with complication, without long-term current use of insulin (HCC) 07/08/2017   Past Surgical History:  Procedure Laterality Date   BACK SURGERY     HERNIA REPAIR     RIGHT/LEFT HEART CATH AND CORONARY ANGIOGRAPHY N/A 09/16/2017   Procedure: RIGHT/LEFT HEART CATH AND CORONARY  ANGIOGRAPHY;  Surgeon: [place], Peter M, MD;  Location: [facility];  Service: Cardiovascular;  Laterality: N/A;   SPINE SURGERY     Social History:  reports that he has never smoked. He has never used smokeless tobacco. He reports that he does not drink alcohol and does not use drugs.  Allergies  Allergen Reactions   Lipitor [Atorvastatin] Other (See Comments)    Damage to calf muscles   Vytorin [Ezetimibe-Simvastatin] Other (See Comments)    Damage to calf muscles   Amoxicillin Hives   Cephalexin Itching   Statins Other (See Comments)    Muscle pain    Tape Rash and Other (See Comments)    Broke out the skin    Family History  Problem Relation Age of Onset   Stroke Mother    Kidney disease Father    Stroke Father    Hypertension Father    Early death Father    Cancer Neg Hx    Alcohol abuse Neg Hx    Diabetes Neg Hx    Heart disease Neg Hx    Hyperlipidemia Neg Hx     Prior to Admission medications   Medication Sig Start Date End Date Taking? Authorizing Provider  acetaminophen (TYLENOL) 325 MG tablet Take 2 tablets (650 mg total) by mouth every 4 (four) hours as needed for mild pain (or temp > 37.5 C (99.5 F)). 03/07/20   Angiulli, Mcarthur Rossetti, PA-C  albuterol (VENTOLIN HFA) 108 (90 Base) MCG/ACT inhaler Inhale 2 puffs into the lungs every 6 (six) hours as needed for wheezing or shortness of breath. 04/29/22   Janeece Agee, NP  amiodarone (PACERONE) 200 MG tablet Take 1 tablet (200 mg total) by mouth 2 (two) times daily. 03/21/23 04/20/23  Arrien, York Ram, MD  apixaban (ELIQUIS) 5 MG TABS tablet Take 1 tablet (5 mg total) by mouth 2 (two) times daily. 03/21/23   Arrien, York Ram, MD  cholecalciferol (VITAMIN D) 1000 units tablet Take 1 tablet (1,000 Units total) by mouth daily. 03/07/20   Angiulli, Mcarthur Rossetti, PA-C  escitalopram (LEXAPRO) 20 MG tablet Take 1 tablet (20 mg total) by mouth daily. 10/27/22   [name], PA  esomeprazole (NEXIUM) 40 MG capsule  TAKE 1 CAPSULE BY MOUTH DAILY AS NEEDED FOR HEARTBURN 10/27/22   [name], PA  ezetimibe (ZETIA) 10 MG tablet Take 1 tablet (10 mg total) by mouth daily. 04/09/21   Nahser, Deloris Ping, MD  furosemide (LASIX) 40 MG tablet Take 1 tablet (40 mg total) by mouth daily. 03/21/23   Arrien, York Ram, MD  hydrOXYzine (ATARAX) 10 MG tablet  Take 1 tablet (10 mg total) by mouth 2 (two) times daily as needed for itching or anxiety. 01/22/23   [name], PA  olopatadine (PATANOL) 0.1 % ophthalmic solution INSTILL 1 DROP INTO BOTH EYS DAILY AS NEEDED FOR ALLERGIES 03/20/20   Janeece Agee, NP  ondansetron (ZOFRAN-ODT) 4 MG disintegrating tablet TAKE 2 TABLETS BY MOUTH AS NEEDED 12/31/22   [name], PA  potassium chloride SA (KLOR-CON M) 10 MEQ tablet Take 1 tablet (10 mEq total) by mouth daily. 03/21/23   Arrien, York Ram, MD  rosuvastatin (CRESTOR) 40 MG tablet Take 1 tablet (40 mg total) by mouth daily. 10/29/22   [name], PA  spironolactone (ALDACTONE) 25 MG tablet Take 1/2 tablet (12.5 mg total) by mouth daily. 03/21/23 04/20/23  Arrien, York Ram, MD  tamsulosin (FLOMAX) 0.4 MG CAPS capsule Take 1 capsule (0.4 mg total) by mouth daily after supper. 03/21/23   Arrien, York Ram, MD  traZODone (DESYREL) 50 MG tablet Take 0.5-1 tablets (25-50 mg total) by mouth at bedtime as needed for sleep. 10/27/22   [name], PA  triamcinolone cream (KENALOG) 0.1 % 1 application Externally once a day, mix w/ itch relief lotion for 30 days    [provider]    Physical Exam: Vitals:   03/22/23 1600 03/22/23 1615 03/22/23 1700 03/22/23 1715  BP: 105/66 98/65 99/66  96/63  Pulse: 73 72 72 72  Resp: 17 20 (!) 21 20  Temp:      TempSrc:      SpO2: 100% 100% 93% (!) 87%   General:  Appears calm and comfortable and is in NAD, somewhat disheveled, chronically ill Eyes:   EOMI, normal lids, iris ENT:  grossly normal hearing, lips & tongue, mmm Neck:  no LAD, masses or  thyromegaly Cardiovascular:  RRR, no m/r/g. No LE edema.  Respiratory:   Scattered L > R rhonchi.  Normal respiratory effort.  On [place] O2. Abdomen:  soft, NT, ND Skin:  no rash or induration seen on limited exam Musculoskeletal:  grossly normal tone BUE/BLE, good ROM, no bony abnormality Psychiatric:  grossly normal mood and affect, speech fluent and appropriate, AOx2-3 Neurologic:  CN 2-12 grossly intact, moves all extremities in coordinated fashion   Radiological Exams on Admission: Independently reviewed - see discussion in A/P where applicable  CT Head Wo Contrast  Result Date: 03/22/2023 CLINICAL DATA:  Mental status change, unknown cause EXAM: CT HEAD WITHOUT CONTRAST TECHNIQUE: Contiguous axial images were obtained from the base of the skull through the vertex without intravenous contrast. RADIATION DOSE REDUCTION: This exam was performed according to the departmental dose-optimization program which includes automated exposure control, adjustment of the mA and/or kV according to patient size and/or use of iterative reconstruction technique. COMPARISON:  02/27/2020 FINDINGS: Brain: No evidence of acute infarction, hemorrhage, hydrocephalus, extra-axial collection or mass lesion/mass effect. Encephalomalacia in the right occipital lobe compatible with remote infarct. Extensive low-density changes within the periventricular and subcortical white matter most compatible with chronic microvascular ischemic change. Mild diffuse cerebral volume loss. Vascular: Atherosclerotic calcifications involving the large vessels of the skull base. No unexpected hyperdense vessel. Skull: Normal. Negative for fracture or focal lesion. Sinuses/Orbits: No acute finding. Other: None. IMPRESSION: 1. No acute intracranial abnormality. 2. Chronic microvascular ischemic change and cerebral volume loss. 3. Remote right occipital lobe infarct. Electronically Signed   By: Duanne Guess D.O.   On: 03/22/2023 11:58   DG  Pelvis Portable  Result Date: 03/22/2023 CLINICAL DATA:  Trauma, fall  EXAM: PORTABLE PELVIS 1-2 VIEWS COMPARISON:  None Available. FINDINGS: No displaced fracture or dislocation is seen. Joint spaces in both hips appear symmetrical. Degenerative changes are noted in visualized lower lumbar spine. IMPRESSION: No displaced fracture or dislocation is seen.  Lumbar spondylosis. Electronically Signed   By: Ernie Avena M.D.   On: 03/22/2023 11:09   DG Chest Port 1 View  Result Date: 03/22/2023 CLINICAL DATA:  Trauma, fall EXAM: PORTABLE CHEST 1 VIEW COMPARISON:  03/07/2023 FINDINGS: Transverse diameter of heart is increased. Increased interstitial markings are seen in the parahilar regions, lower lung fields. Increased markings are seen in right upper lung field. Left lateral CP angle is indistinct. There is no pneumothorax. IMPRESSION: Cardiomegaly. Increased interstitial markings are seen in both lungs suggesting interstitial edema or multifocal interstitial pneumonia. Small left pleural effusion. Electronically Signed   By: Ernie Avena M.D.   On: 03/22/2023 11:08    EKG: Independently reviewed.  NSR with rate 79; prolonged QTc 594; LBBB with no evidence of acute ischemia   Labs on Admission: I have personally reviewed the available labs and imaging studies at the time of the admission.  Pertinent labs:    Glucose 117 BUN 23/Creatinine 1.47/GFR 47; 21/1.27/56 on 6/1 CK 87 HS troponin 43, 37 Lactate 2.1, 1.6 Unremarkable CBC   Assessment and Plan: Principal Problem:   Fall at home, initial encounter Active Problems:   Acute pulmonary embolism (HCC)   Paroxysmal atrial fibrillation (HCC)   Benign essential HTN   Type 2 diabetes mellitus with hyperlipidemia (HCC)   Hyperlipidemia   Coronary artery disease involving native coronary artery of native heart without angina pectoris   Acute urinary retention   MCI (mild cognitive impairment)   Chronic systolic congestive heart  failure (HCC)    Fall -Patient was discharged yesterday AM -He was initially recommended to go to SNF and then the decision was made not to pursue Peer to Peer and he was discharged to home with [facility] -He does not have 24 hour supervision and his ex-wife believes that he is unsafe at home and needs placement -He fell overnight and could not get up -He does not have an obvious admittable diagnosis at this time but will be observed in anticipation of placement - he already has a qualifying inpatient stay and failed at home -The patient clearly prefers to be home and the goal will be to return home post-rehab  Chronic CHF -EF during last hospitalization was <20% with some AS, not a candidate for TAVR -He was placed on IV diuresis and diuresed well -He is not overtly overloaded but has ?edema on CXR and clearly was coughing during my evaluation -Will give 2 doses of IV Lasix (tonight and tomorrow AM) and then resume home Lasix on Tuesday (6/4) -Continue spironolactone -Other therapies are limited by his hypotension  Urinary retention -Foley placed during prior hospitalization -Traumatic removal during his fall at home -Will do voiding trial but he appears likely to still need a foley for now -Continue tamsulosin -Will need f/u with urology  Afib -Rate controlled with amiodarone -Continue Eliquis  PE -Acute B PE during last hospitalization -Continue Eliquis -Qualified for home O2 but ex-wife reports that he refused to wear it - perhaps this contributed to the fall? -Continue albuterol  HTN -History of but more recently with borderline hypotension -May benefit from midodrine -Continue Lasix and spironolactone for now  DM -Recent A1c was 7.6, which is appropriate given his circumstances -He is not on medications  at this time -Hold SSI for now - his risks from hypoglycemia are greater than those for hyper at this time  CAD -No c/o CP currently -Statin intolerance noted with other  statins but will continue Crestor -Continue Zetia  MCI -Had SI during his last hospitalization -Also with reported MCI -Will order delirium precautions -Continue escitalopram  Goals of care -Prior plan for home/SNF with palliative care -DNR - ACP documents reviewed and d/w NOK -Consider repeat palliative care evaluation -Overall poor prognosis    Advance Care Planning:   Code Status: DNR   Consults: TOC team, nutrition, PT/OT  DVT Prophylaxis: Eliquis  Family Communication: None present; I spoke with his ex-wife/NOK at the time of evaluation  Severity of Illness: The appropriate patient status for this patient is OBSERVATION. Observation status is judged to be reasonable and necessary in order to provide the required intensity of service to ensure the patient's safety. The patient's presenting symptoms, physical exam findings, and initial radiographic and laboratory data in the context of their medical condition is felt to place them at decreased risk for further clinical deterioration. Furthermore, it is anticipated that the patient will be medically stable for discharge from the hospital within 2 midnights of admission.   Author: Jonah Blue, MD 03/22/2023 6:01 PM  For on call review [email or web address].

## 2023-03-22 NOTE — ED Provider Notes (Signed)
[place] EMERGENCY DEPARTMENT AT [facility] Provider Note   CSN: [id number] Arrival date & time: 03/22/23  1013     History {Add pertinent medical, surgical, social history, OB history to HPI:1} Chief Complaint  Patient presents with   Altered Mental Status    Ptolemy Savannah is a 82 y.o. male.  82 year old male with prior medical history as detailed below presents for evaluation.  Patient was discharged yesterday after admission.  Patient apparently lives at home alone.  Patient reports that this morning he tripped on his Foley catheter.  The catheter was traumatically removed with the trip and fall.  Patient's neighbor found him on the floor of his residence when the neighbor checked on him this morning.  Patient reports that after the fall he attempted to get onto a couch but was too weak to do so.  Patient denies any specific pain or injury related to his fall.  Patient reports that the Foley catheter was placed during last admission for urinary retention.  Patient is adamant about his desire to return home.   See excerpt below from recent admission:  Discharge Diagnoses: Principal Problem:   Acute on chronic systolic CHF (congestive heart failure) (HCC) Active Problems:   Acute pulmonary embolism (HCC)   Paroxysmal atrial fibrillation (HCC)   Benign essential HTN   Type 2 diabetes mellitus with hyperlipidemia (HCC)   Coronary artery disease involving native coronary artery of native heart without angina pectoris   Mood disorder (HCC)   Liver lesion   Acute urinary retention   Resolved Problems:   * No resolved hospital problems. [facility] Course: Mr. Vicencio was admitted to the hospital with the working diagnosis of heat failure exacerbation.    [name] with history of chronic systolic CHF, NICM, CVA, hypertension, type 2 diabetes mellitus, and cognitive impairment who presented to the ED with dyspnea on exertion for several months worsened over 2 days.  On the day of admission his symptoms were very severe, he was pale and very weak, EMS was called and patient was transported to the ED. On his initial physical examination his blood pressure was 104/88, HR 82, RR 80 and 02 saturation 60 to 88%. Lungs with decreases breath sounds bilaterally, heart with S1 and S2 present irregularly irregular, abdomen with no distention and positive lower extremity edema.    Na 138, K 3,7 Cl 102 bicarbonate 25 glucose 139, bun 26 cr 1,21 Mg 1,9  BNP 2,688 High sensitive troponin 528, 584, 527  Wbc 9,1 hgb 12,8 plt 217    Chest radiograph with cardiomegaly and bilateral hilar vascular congestion.   CT chest with bilateral faint ground glass opacities, with small bilateral pleural effusions. Bilateral pulmonary embolism in the central right upper lobe pulmonary artery and segmental branches, anterior segmental branch in the right lower lobe pulmonary artery, and posterior segmental branches of the left lower lobe pulmonary artery. No evidence or elevated RV/LV ration.     EKG 95 bpm, right axis deviation, qtc 575, interventricular conduction delay, atrial fibrillation rhythm with poor R R wave progression, no significant ST segment or T wave changes.    Started on IV heparin -5/19, diuretics started, echo noted EF down to<20% -5/20 advanced heart failure team consulted, transferred to SDU for dobutamine -5/21 tachyarrhythmia started on amiodarone palliative care consulted -5/22, reported suicidal ideation, psych consulted, dobutamine discontinued -5/23, palliative meeting again, patient and ex-wife decided to pursue SNF for short-term rehab with palliative care, cleared by psychiatry for  rehab   05/29 patient hemodynamically stable but continue very weak and deconditioned.  His care giver is concerned about safety of him alone at home.  05/30 patient continue very weak, possible transition to SNF.  06/01 patient has improved his mobility, no dyspnea or chest  pain. Considering his improvement in physical function, plan is to discharge home with home health services.  Follow up as outpatient.     The history is provided by the patient and medical records.       Home Medications Prior to Admission medications   Medication Sig Start Date End Date Taking? Authorizing Provider  acetaminophen (TYLENOL) 325 MG tablet Take 2 tablets (650 mg total) by mouth every 4 (four) hours as needed for mild pain (or temp > 37.5 C (99.5 F)). 03/07/20   Angiulli, Mcarthur Rossetti, PA-C  albuterol (VENTOLIN HFA) 108 (90 Base) MCG/ACT inhaler Inhale 2 puffs into the lungs every 6 (six) hours as needed for wheezing or shortness of breath. 04/29/22   Janeece Agee, NP  amiodarone (PACERONE) 200 MG tablet Take 1 tablet (200 mg total) by mouth 2 (two) times daily. 03/21/23 04/20/23  Arrien, York Ram, MD  apixaban (ELIQUIS) 5 MG TABS tablet Take 1 tablet (5 mg total) by mouth 2 (two) times daily. 03/21/23   Arrien, York Ram, MD  cholecalciferol (VITAMIN D) 1000 units tablet Take 1 tablet (1,000 Units total) by mouth daily. 03/07/20   Angiulli, Mcarthur Rossetti, PA-C  escitalopram (LEXAPRO) 20 MG tablet Take 1 tablet (20 mg total) by mouth daily. 10/27/22   [name], PA  esomeprazole (NEXIUM) 40 MG capsule TAKE 1 CAPSULE BY MOUTH DAILY AS NEEDED FOR HEARTBURN 10/27/22   [name], PA  ezetimibe (ZETIA) 10 MG tablet Take 1 tablet (10 mg total) by mouth daily. 04/09/21   Nahser, Deloris Ping, MD  furosemide (LASIX) 40 MG tablet Take 1 tablet (40 mg total) by mouth daily. 03/21/23   Arrien, York Ram, MD  hydrOXYzine (ATARAX) 10 MG tablet Take 1 tablet (10 mg total) by mouth 2 (two) times daily as needed for itching or anxiety. 01/22/23   [name], PA  olopatadine (PATANOL) 0.1 % ophthalmic solution INSTILL 1 DROP INTO BOTH EYS DAILY AS NEEDED FOR ALLERGIES 03/20/20   Janeece Agee, NP  ondansetron (ZOFRAN-ODT) 4 MG disintegrating tablet TAKE 2 TABLETS BY MOUTH AS NEEDED  12/31/22   [name], PA  potassium chloride SA (KLOR-CON M) 10 MEQ tablet Take 1 tablet (10 mEq total) by mouth daily. 03/21/23   Arrien, York Ram, MD  rosuvastatin (CRESTOR) 40 MG tablet Take 1 tablet (40 mg total) by mouth daily. 10/29/22   [name], PA  spironolactone (ALDACTONE) 25 MG tablet Take 1/2 tablet (12.5 mg total) by mouth daily. 03/21/23 04/20/23  Arrien, York Ram, MD  tamsulosin (FLOMAX) 0.4 MG CAPS capsule Take 1 capsule (0.4 mg total) by mouth daily after supper. 03/21/23   Arrien, York Ram, MD  traZODone (DESYREL) 50 MG tablet Take 0.5-1 tablets (25-50 mg total) by mouth at bedtime as needed for sleep. 10/27/22   [name], PA  triamcinolone cream (KENALOG) 0.1 % 1 application Externally once a day, mix w/ itch relief lotion for 30 days    [provider]      Allergies    Amoxicillin, Cephalexin, Lipitor [atorvastatin], Statins, and Vytorin [ezetimibe-simvastatin]    Review of Systems   Review of Systems  All other systems reviewed and are negative.   Physical Exam Updated Vital Signs  BP 94/74 (BP Location: Left Arm)   Pulse 79   Temp 97.7 F (36.5 C)   Resp 20   SpO2 100%  Physical Exam Vitals and nursing note reviewed.  Constitutional:      General: He is not in acute distress.    Appearance: Normal appearance. He is well-developed.  HENT:     Head: Normocephalic and atraumatic.  Eyes:     Conjunctiva/sclera: Conjunctivae normal.     Pupils: Pupils are equal, round, and reactive to light.  Cardiovascular:     Rate and Rhythm: Normal rate and regular rhythm.     Heart sounds: Normal heart sounds.  Pulmonary:     Effort: Pulmonary effort is normal. No respiratory distress.     Breath sounds: Normal breath sounds.  Abdominal:     General: There is no distension.     Palpations: Abdomen is soft.     Tenderness: There is no abdominal tenderness.  Genitourinary:    Comments: Blood at penile meatus after traumatic  removal of Foley catheter Musculoskeletal:        General: No deformity. Normal range of motion.     Cervical back: Normal range of motion and neck supple.  Skin:    General: Skin is warm and dry.  Neurological:     General: No focal deficit present.     Mental Status: He is alert and oriented to person, place, and time.     ED Results / Procedures / Treatments   Labs (all labs ordered are listed, but only abnormal results are displayed) Labs Reviewed  CBC WITH DIFFERENTIAL/PLATELET - Abnormal; Notable for the following components:      Result Value   Hemoglobin 12.5 (*)    All other components within normal limits  COMPREHENSIVE METABOLIC PANEL - Abnormal; Notable for the following components:   Sodium 134 (*)    Chloride 96 (*)    Glucose, Bld 117 (*)    Creatinine, Ser 1.47 (*)    Total Bilirubin 1.5 (*)    GFR, Estimated 47 (*)    All other components within normal limits  LACTIC ACID, PLASMA - Abnormal; Notable for the following components:   Lactic Acid, Venous 2.1 (*)    All other components within normal limits  CBG MONITORING, ED - Abnormal; Notable for the following components:   Glucose-Capillary 107 (*)    All other components within normal limits  TROPONIN I (HIGH SENSITIVITY) - Abnormal; Notable for the following components:   Troponin I (High Sensitivity) 43 (*)    All other components within normal limits  CULTURE, BLOOD (SINGLE)  CK  URINALYSIS, ROUTINE W REFLEX MICROSCOPIC  BRAIN NATRIURETIC PEPTIDE  LACTIC ACID, PLASMA  TROPONIN I (HIGH SENSITIVITY)    EKG EKG Interpretation  Date/Time:  Sunday March 22 2023 10:34:21 EDT Ventricular Rate:  79 PR Interval:  209 QRS Duration: 171 QT Interval:  518 QTC Calculation: 594 R Axis:   -75 Text Interpretation: Sinus rhythm Left bundle branch block Confirmed by Kristine Royal [phone]) on 03/22/2023 10:46:00 AM  Radiology CT Head Wo Contrast  Result Date: 03/22/2023 CLINICAL DATA:  Mental status change,  unknown cause EXAM: CT HEAD WITHOUT CONTRAST TECHNIQUE: Contiguous axial images were obtained from the base of the skull through the vertex without intravenous contrast. RADIATION DOSE REDUCTION: This exam was performed according to the departmental dose-optimization program which includes automated exposure control, adjustment of the mA and/or kV according to patient size and/or use of iterative reconstruction technique.  COMPARISON:  02/27/2020 FINDINGS: Brain: No evidence of acute infarction, hemorrhage, hydrocephalus, extra-axial collection or mass lesion/mass effect. Encephalomalacia in the right occipital lobe compatible with remote infarct. Extensive low-density changes within the periventricular and subcortical white matter most compatible with chronic microvascular ischemic change. Mild diffuse cerebral volume loss. Vascular: Atherosclerotic calcifications involving the large vessels of the skull base. No unexpected hyperdense vessel. Skull: Normal. Negative for fracture or focal lesion. Sinuses/Orbits: No acute finding. Other: None. IMPRESSION: 1. No acute intracranial abnormality. 2. Chronic microvascular ischemic change and cerebral volume loss. 3. Remote right occipital lobe infarct. Electronically Signed   By: Duanne Guess D.O.   On: 03/22/2023 11:58   DG Pelvis Portable  Result Date: 03/22/2023 CLINICAL DATA:  Trauma, fall EXAM: PORTABLE PELVIS 1-2 VIEWS COMPARISON:  None Available. FINDINGS: No displaced fracture or dislocation is seen. Joint spaces in both hips appear symmetrical. Degenerative changes are noted in visualized lower lumbar spine. IMPRESSION: No displaced fracture or dislocation is seen.  Lumbar spondylosis. Electronically Signed   By: Ernie Avena M.D.   On: 03/22/2023 11:09   DG Chest Port 1 View  Result Date: 03/22/2023 CLINICAL DATA:  Trauma, fall EXAM: PORTABLE CHEST 1 VIEW COMPARISON:  03/07/2023 FINDINGS: Transverse diameter of heart is increased. Increased  interstitial markings are seen in the parahilar regions, lower lung fields. Increased markings are seen in right upper lung field. Left lateral CP angle is indistinct. There is no pneumothorax. IMPRESSION: Cardiomegaly. Increased interstitial markings are seen in both lungs suggesting interstitial edema or multifocal interstitial pneumonia. Small left pleural effusion. Electronically Signed   By: Ernie Avena M.D.   On: 03/22/2023 11:08    Procedures Procedures  {Document cardiac monitor, telemetry assessment procedure when appropriate:1}  Medications Ordered in ED Medications - No data to display  ED Course/ Medical Decision Making/ A&P   {   Click here for ABCD2, HEART and other calculatorsREFRESH Note before signing :1}                          Medical Decision Making Amount and/or Complexity of Data Reviewed Labs: ordered. Radiology: ordered.  Risk Decision regarding hospitalization.    Medical Screen Complete  This patient presented to the ED with complaint of fall, weakness.  This complaint involves an extensive number of treatment options. The initial differential diagnosis includes, but is not limited to, trauma from fall, metabolic abnormality, etc  This presentation is: Acute, Chronic, Self-Limited, Previously Undiagnosed, Uncertain Prognosis, Complicated, Systemic Symptoms, and Threat to Life/Bodily Function  Patient presents after apparent fall at home.  Of note, patient was discharged yesterday.  Patient apparently refused placement.  Patient apparently had a fall at home this morning.  He reports that he tripped over his Foley catheter.  After the fall the Foley catheter was traumatically removed.  The patient was unable to stand and get onto a couch.  This was secondary to profound weakness.  On evaluation here in the ED the patient is without specific complaint.  He persist in expressing interest only in going home.  Bladder scanning reveals urinary  bladder volume of approximately 100 mL.  Patient is encouraged to urinate or attempt to urinate.  Patient continues to refuse to stand or to attempt ambulation.  Given profound weakness and apparent failure to care for himself in the home environment after recent admission the patient would benefit from readmission.  Hospitalist Ophelia Charter) service made aware of case and will evaluate for  admission. Dr. Hyacinth Meeker also aware of pending hospitalist eval.    Additional history obtained:  Additional history obtained from EMS External records from outside sources obtained and reviewed including prior ED visits and prior Inpatient records.    Lab Tests:  I ordered and personally interpreted labs.  The pertinent results include:  ***   Imaging Studies ordered:  I ordered imaging studies including ***  I independently visualized and interpreted obtained imaging which showed *** I agree with the radiologist interpretation.   Cardiac Monitoring:  The patient was maintained on a cardiac monitor.  I personally viewed and interpreted the cardiac monitor which showed an underlying rhythm of: ***   Medicines ordered:  I ordered medication including ***  for ***  Reevaluation of the patient after these medicines showed that the patient: {resolved/improved/worsened:23923::"improved"}    Test Considered:  ***   Critical Interventions:  ***   Consultations Obtained:  I consulted ***,  and discussed lab and imaging findings as well as pertinent plan of care.    Problem List / ED Course:  Fall   Reevaluation:  After the interventions noted above, I reevaluated the patient and found that they have: {resolved/improved/worsened:23923::"improved"}   Social Determinants of Health:  ***   Disposition:  After consideration of the diagnostic results and the patients response to treatment, I feel that the patent would benefit from ***.    {Document critical care time when  appropriate:1} {Document review of labs and clinical decision tools ie heart score, Chads2Vasc2 etc:1}  {Document your independent review of radiology images, and any outside records:1} {Document your discussion with family members, caretakers, and with consultants:1} {Document social determinants of health affecting pt's care:1} {Document your decision making why or why not admission, treatments were needed:1} Final Clinical Impression(s) / ED Diagnoses Final diagnoses:  None    Rx / DC Orders ED Discharge Orders     None

## 2023-03-22 NOTE — ED Notes (Signed)
RN encouraged pt to urinate in urinal. Pt is lethargic and keeps falling asleep

## 2023-03-22 NOTE — ED Notes (Signed)
Patient is resting comfortably. 

## 2023-03-22 NOTE — ED Notes (Signed)
Pt attempting to urinate with no success.  MD notified and stated to place foley.

## 2023-03-22 NOTE — ED Notes (Signed)
Pt friend, Minerva Areola, states pt was very confused and didn't know how to use the walker yesterday.  Friend states he does not feel the pt could have taken care of the catheter.  Pt lives alone.

## 2023-03-22 NOTE — ED Triage Notes (Signed)
Pt BIB EMS due to AMS. Pt usually axox4 and lives alone. Neighbor found pt on ground with blood all over him. Pt discharged yesterday.

## 2023-03-22 NOTE — ED Notes (Signed)
Catheter placed after 2 attempts.  First attempt, resistance noted, with blood draining after catheter removed.  2nd attempt, minimal resistance.  Bloody urine noted, though minimal, only 10 ml. RN repeatedly asked RE pain at site, and pt repeatedly denied.  2 other RN's assessed Foley and agreed placement was correct.

## 2023-03-22 NOTE — ED Notes (Signed)
Pt ripped out urinary catheter, pt has dry blood on penis. Dry blood wiped off.

## 2023-03-23 DIAGNOSIS — I10 Essential (primary) hypertension: Secondary | ICD-10-CM

## 2023-03-23 DIAGNOSIS — Y92009 Unspecified place in unspecified non-institutional (private) residence as the place of occurrence of the external cause: Secondary | ICD-10-CM

## 2023-03-23 DIAGNOSIS — W19XXXA Unspecified fall, initial encounter: Secondary | ICD-10-CM | POA: Diagnosis not present

## 2023-03-23 LAB — CBC
HCT: 38.4 % — ABNORMAL LOW (ref 39.0–52.0)
Hemoglobin: 12 g/dL — ABNORMAL LOW (ref 13.0–17.0)
MCH: 28.6 pg (ref 26.0–34.0)
MCHC: 31.3 g/dL (ref 30.0–36.0)
MCV: 91.6 fL (ref 80.0–100.0)
Platelets: 215 10*3/uL (ref 150–400)
RBC: 4.19 MIL/uL — ABNORMAL LOW (ref 4.22–5.81)
RDW: 14.6 % (ref 11.5–15.5)
WBC: 12.8 10*3/uL — ABNORMAL HIGH (ref 4.0–10.5)
nRBC: 0 % (ref 0.0–0.2)

## 2023-03-23 LAB — BRAIN NATRIURETIC PEPTIDE: B Natriuretic Peptide: 1758 pg/mL — ABNORMAL HIGH (ref 0.0–100.0)

## 2023-03-23 LAB — BASIC METABOLIC PANEL
Anion gap: 16 — ABNORMAL HIGH (ref 5–15)
BUN: 27 mg/dL — ABNORMAL HIGH (ref 8–23)
CO2: 22 mmol/L (ref 22–32)
Calcium: 8.2 mg/dL — ABNORMAL LOW (ref 8.9–10.3)
Chloride: 97 mmol/L — ABNORMAL LOW (ref 98–111)
Creatinine, Ser: 2.17 mg/dL — ABNORMAL HIGH (ref 0.61–1.24)
GFR, Estimated: 30 mL/min — ABNORMAL LOW (ref >60)
Glucose, Bld: 154 mg/dL — ABNORMAL HIGH (ref 70–99)
Potassium: 4.2 mmol/L (ref 3.5–5.1)
Sodium: 135 mmol/L (ref 135–145)

## 2023-03-23 LAB — CULTURE, BLOOD (SINGLE)

## 2023-03-23 MED ORDER — CHLORHEXIDINE GLUCONATE CLOTH 2 % EX PADS
6.0000 | MEDICATED_PAD | Freq: Every day | CUTANEOUS | Status: DC
  Administered 2023-03-24 – 2023-03-26 (×3): 6 via TOPICAL

## 2023-03-23 MED ORDER — SPIRONOLACTONE 12.5 MG HALF TABLET
12.5000 mg | ORAL_TABLET | Freq: Every day | ORAL | Status: DC
  Filled 2023-03-23 (×2): qty 1

## 2023-03-23 MED ORDER — FUROSEMIDE 40 MG PO TABS: 40.0000 mg | ORAL_TABLET | Freq: Every day | ORAL | Status: DC

## 2023-03-23 NOTE — Telephone Encounter (Signed)
Patient Advocate Encounter  Patient is currently admitted to hospital, and has an appointment scheduled for Wednesday 6/5. Will wait for updates on patient status/ discharge before contacting pt regarding expense report.

## 2023-03-23 NOTE — Progress Notes (Signed)
PROGRESS NOTE  Christian [name]  DOB: February 05, 1941  PCP: Christian [name], [place] ZOX:[id number]  DOA: 03/22/2023  LOS: 0 days  Hospital Day: 2  Brief narrative: Christian [name] is a 82 y.o. [name] with PMH significant for DM2, HTN, HLD, CAD, chronic systolic CHF, moderate pulmonary hypertension, GERD, dysphagia, cognitive impairment, anxiety/depression who lives alone at home, his wife lives next-door. Recently hospitalized 5/18 to 6/1 for worsening shortness of breath, diagnosed with bilateral PE, acute CHF exacerbation, required dobutamine drip by advanced heart failure service.  He was recommended supplement oxygen for ambulation which he refused.  At some point during the hospital, he also endorsed suicidal ideation and psychiatry was consulted but did not require inpatient psych hospitalization. He was recommended SNF by PT but insurance did not approve and hence he was discharged home 6/1.  6/2, within 24 hours of discharge, patient was brought to the ED by EMS after he was found on the floor with blood all over him.  Patient reported that his socks/shoes were slippery and he fell.  He tried to get up and was unable, his Foley got pulled out in the process and started bleeding.  His next-door family found him on the ground with blood everywhere.  Patient was incoherent.  EMS brought him to ED.  In the ED, patient was afebrile, heart rate in 70s, blood pressure in 90s, on 4 L oxygen Labs with unremarkable CBC, BMP with sodium 134, creatinine 23/1.47, lactic acid 2.1 ABG normal. CT head did not show any acute abnormality, showed chronic microvascular ischemic changes and remote right occipital infarct Chest x-ray showed bilateral increased interstitial markings suggestive of interstitial edema or multifocal interstitial pneumonia. X-ray pelvis did not show any evidence of fracture. Foley catheter was changed in the ED Admitted to [facility]  Subjective: Patient was seen and examined this morning. Christian  Caucasian [name] lying down on recliner.  Did not like to be bugged. Not in distress.  Family not at bedside.   Foley catheter with strawberry lemonade color urine Chart reviewed Afebrile, heart rate in 90s, blood pressure in 90s.  Assessment and plan: Fall Impaired mobility Patient has impaired mobility at baseline, recently seen by PT and was recommended SNF but insurance did not approve. Readmitted after a fall at home.  Unclear circumstances.  Patient states he had slippery shocks.   He does not have 24 hour supervision and his ex-wife believes that he is unsafe at home and needs placement TOC consulted   Chronic CHF Hypotension EF less than 20% at least since 2018 which had eventually improved but echo during recent aspiration showed EF down to less than 20% again.  Seen by advanced heart failure, required IV diuresis and dobutamine drip. Volume status currently stable. Has been given 2 doses of IV Lasix so far. Blood pressure running in the low normal range.  But up to 130s this morning. PTA on Lasix 40 mg daily, Aldactone 12.5 mg daily.  Resume both.  If blood pressure drops, may need to consider midodrine.  Hematuria Secondary to traumatic Foley removal at home while on Eliquis. He required Foley catheter insertion during last hospitalization for acute urinary retention and was discharged with the same. Foley catheter was changed in the ED.  Currently has strawberry lemonade color urine Remains on Eliquis Continue monitor Continue Flomax   Afib Rate controlled with amiodarone Continue Eliquis   Recent acute PE Diagnosed during last hospitalization Continue Eliquis Check oxygen requirement Continue albuterol  Type 2 diabetes mellitus A1c  7.6 on January 2024 PTA not on meds.  Target A1c of 8 is appropriate for him Currently on sliding scale insulin with Accu-Cheks Recent Labs  Lab 03/20/23 1117 03/20/23 1602 03/20/23 2101 03/21/23 0555 03/22/23 1042  GLUCAP 214*  162* 127* 94 107*     CAD HLD Currently on Eliquis and Zetia   Cognitive impairment Noted during last hospitalization when he also had suicidal ideation and was seen by psychiatry. Continue delirium precautions Continue escitalopram   Mobility: PT eval pending  Goals of care   Code Status: DNR     DVT prophylaxis:   apixaban (ELIQUIS) tablet 5 mg  Antimicrobials: None Fluid: None Consultants: None Family Communication: None at bedside  Status: Observation Level of care:  Med-Surg   Patient from: Home Anticipated d/c to: Pending clinical course Needs to continue in-hospital care:  Continues to have hematuria.  Probably needs placement    Diet:  Diet Order             Diet Heart Room service appropriate? Yes; Fluid consistency: Thin; Fluid restriction: 1800 mL Fluid  Diet effective now                   Scheduled Meds:  amiodarone  200 mg Oral BID   apixaban  5 mg Oral BID   Chlorhexidine Gluconate Cloth  6 each Topical Daily   docusate sodium  100 mg Oral BID   escitalopram  20 mg Oral QHS   ezetimibe  10 mg Oral Daily   [START ON 03/24/2023] furosemide  40 mg Oral Daily   pantoprazole  40 mg Oral Daily   rosuvastatin  40 mg Oral Daily   spironolactone  12.5 mg Oral Daily   tamsulosin  0.4 mg Oral QPC supper    PRN meds: acetaminophen **OR** acetaminophen, albuterol, bisacodyl, hydrALAZINE, ondansetron **OR** ondansetron (ZOFRAN) IV, polyethylene glycol   Infusions:    Antimicrobials: Anti-infectives (From admission, onward)    None       Nutritional status:  There is no height or weight on file to calculate BMI.          Objective: Vitals:   03/23/23 0525 03/23/23 0754  BP: 94/63 (!) 130/118  Pulse: 77 85  Resp: 18 17  Temp: 98.2 F (36.8 C)   SpO2: 100% 98%    Intake/Output Summary (Last 24 hours) at 03/23/2023 1102 Last data filed at 03/22/2023 1916 Gross per 24 hour  Intake --  Output 10 ml  Net -10 ml   There were  no vitals filed for this visit. Weight change:  There is no height or weight on file to calculate BMI.   Physical Exam: General exam: Christian [name].  Not in distress.  Not so pleasant on exam Skin: No rashes, lesions or ulcers. HEENT: Atraumatic, normocephalic, no obvious bleeding Lungs: Clear to auscultation bilaterally CVS: Regular rate and rhythm, no murmur GI/Abd soft, nontender, nondistended, bowel sound present CNS: Alert, awake, oriented to place Psychiatry: Upset Extremities: No pedal edema, no calf tenderness  Data Review: I have personally reviewed the laboratory data and studies available.  F/u labs ordered Unresulted Labs (From admission, onward)     Start     Ordered   03/22/23 1716  Blood gas, arterial (at [facility])  Once,   R        03/22/23 1715   03/22/23 1027  Urinalysis, Routine w reflex microscopic -Urine, Clean Catch  Once,   URGENT  Question:  Specimen Source  Answer:  Urine, Clean Catch   03/22/23 1026            Total time spent in review of labs and imaging, patient evaluation, formulation of plan, documentation and communication with family: 55 minutes  Signed, Lorin Glass, MD [facility] 03/23/2023

## 2023-03-23 NOTE — Evaluation (Signed)
Occupational Therapy Evaluation Patient Details Name: Christian [name] MRN: [id number] DOB: December 19, 1940 Today's Date: 03/23/2023   History of Present Illness Christian [name] is a 82 y.o. male who presents with AMS and a fall at home. Last admitted at [facility] from 5/18-6/1 for bilateral segmental PE.  PMHx: R PCA CVA, Allergic rhinitis, HFrEF, CAD, HTN, GERD, Hernia of abdominal cavity, HLD, neuropathy involving both lower extremities, and DM II.   Clinical Impression   Pt evaluated s/p above admission list. Pt reports independence with ADLs and mobility with use of a RW at baseline, however pt questionable historian at this time. During session, pt limited secondary to decreased strength, poor balance, pain at foley insertion site, decreased insight into deficits and safety awareness, required max cues throughout for redirection as pt was perseverating on getting a fruit cup. Pt currently requires mod A for seated UB ADLs and max A for LB ADLs. Pt completed STS transfer from EOB using RW with min A and cues for safety. Pt completed room level mobility from bed>chair using RW with min guard A for stability. Pt would benefit from acute OT services to maximize functional independence and facilitate transition to skilled inpatient follow up therapy, <3 hours/day.      Recommendations for follow up therapy are one component of a multi-disciplinary discharge planning process, led by the attending physician.  Recommendations may be updated based on patient status, additional functional criteria and insurance authorization.   Assistance Recommended at Discharge    Patient can return home with the following      Functional Status Assessment     Equipment Recommendations       Recommendations for Other Services       Precautions / Restrictions Precautions Precautions: Fall Restrictions Weight Bearing Restrictions: No      Mobility Bed Mobility Overal bed mobility: Needs Assistance Bed Mobility:  Supine to Sit     Supine to sit: Min assist, HOB elevated     General bed mobility comments: HOB elevated, min A for trunk support, cues for sequencing task    Transfers Overall transfer level: Needs assistance Equipment used: Rolling walker (2 wheels) Transfers: Sit to/from Stand Sit to Stand: Min assist           General transfer comment: STS from EOB using RW with min A and min verbal cues for safe hand placement. Room level mobility from bed>chair with RW and min guard A.      Balance Overall balance assessment: Needs assistance Sitting-balance support: Feet supported, Single extremity supported Sitting balance-Leahy Scale: Poor Sitting balance - Comments: sitting EOB   Standing balance support: Bilateral upper extremity supported, Reliant on assistive device for balance Standing balance-Leahy Scale: Poor Standing balance comment: BUE support on RW for stability                           ADL either performed or assessed with clinical judgement   ADL                                               Vision         Perception     Praxis      Pertinent Vitals/Pain Pain Assessment Pain Assessment: Faces Faces Pain Scale: Hurts little more Pain Location: foley insertion site Pain Descriptors / Indicators: Discomfort, Grimacing  Pain Intervention(s): Limited activity within patient's tolerance, Monitored during session     Hand Dominance Right   Extremity/Trunk Assessment Upper Extremity Assessment Upper Extremity Assessment: Defer to OT evaluation   Lower Extremity Assessment Lower Extremity Assessment: Generalized weakness   Cervical / Trunk Assessment Cervical / Trunk Assessment: Kyphotic   Communication Communication Communication: HOH   Cognition Arousal/Alertness: Awake/alert Behavior During Therapy: Flat affect Overall Cognitive Status: History of cognitive impairments - at baseline Area of Impairment:  Orientation, Attention, Memory, Following commands, Safety/judgement, Awareness, Problem solving                 Orientation Level: Situation Current Attention Level: Sustained Memory: Decreased recall of precautions, Decreased short-term memory Following Commands: Follows one step commands with increased time Safety/Judgement: Decreased awareness of safety, Decreased awareness of deficits Awareness: Intellectual Problem Solving: Slow processing, Requires verbal cues, Difficulty sequencing General Comments: Pt with limited safety awareness and insight into deficits. Required simple 1-step commands and increased time throughout session. Pt perseverating on getting fruit cup, max cues to redirect.     General Comments  2LO2, removed for short distance gait to bathroom, sPO2 91% post-gait in room on RA    Exercises     Shoulder Instructions      Home Living Family/patient expects to be discharged to:: Private residence Living Arrangements: Alone Available Help at Discharge: Family;Available PRN/intermittently (ex-wife lives next door) Type of Home: House Home Access: Stairs to enter Secretary/administrator of Steps: 1   Home Layout: One level     Bathroom Shower/Tub: Producer, television/film/video: Standard Bathroom Accessibility: Yes   Home Equipment: Information systems manager   Additional Comments: No family present to confirm, pt questionable historian      Prior Functioning/Environment Prior Level of Function : Needs assist;History of Falls (last six months);Patient poor historian/Family not available             Mobility Comments: Pt reports use of RW for mobility. Pt initially states to PT he has had no falls, then adjusts ADLs Comments: Pt reports independence with ADLs, ex-wife assists with IADLs including med management and transportation        OT Problem List:        OT Treatment/Interventions:      OT Goals(Current goals can be found in the care plan  section) ADL Goals Pt Will Perform Lower Body Dressing: sit to/from stand;with supervision Pt Will Transfer to Toilet: with modified independence;ambulating;regular height toilet Additional ADL Goal #1: Pt will complete bed mobility with mod I as a precursor to ADLs Additional ADL Goal #2: Pt will maintain attention to functional task for 3 minutes with minimal cues for re-direction  OT Frequency:      Co-evaluation              AM-PAC OT "6 Clicks" Daily Activity     Outcome Measure                 End of Session    Activity Tolerance:   Patient left:                     Time:  -    Charges:     Sherley Bounds, OTS Acute Rehabilitation Services Office [phone] Secure Chat Communication Preferred   Sherley Bounds 03/23/2023, 1:43 PM

## 2023-03-23 NOTE — Discharge Instructions (Signed)

## 2023-03-23 NOTE — Evaluation (Signed)
Physical Therapy Evaluation Patient Details Name: Christian [name] MRN: [id number] DOB: [birth year] Today's Date: 03/23/2023  History of Present Illness  Christian [name] is a 82 y.o. male who presents with AMS and a fall at home. Last admitted at [facility] from 5/18-6/1 for bilateral segmental PE.  PMHx: R PCA CVA, Allergic rhinitis, HFrEF, CAD, HTN, GERD, Hernia of abdominal cavity, HLD, neuropathy involving both lower extremities, and DM II.  Clinical Impression   Pt presents with generalized weakness, impaired balance, poor activity tolerance. Pt to benefit from acute PT to address deficits. Pt ambulated room distance to/from bathroom with min steadying assist and use of RW. Pt irritable during session, asks PT to "go away" and "leave me alone". Pt adamant he wants to d/c home and he has enough assist at home, but when asked who will help him pt does not reply. PT recommends short-term rehab post-acutely. PT to progress mobility as tolerated, and will continue to follow acutely.         Recommendations for follow up therapy are one component of a multi-disciplinary discharge planning process, led by the attending physician.  Recommendations may be updated based on patient status, additional functional criteria and insurance authorization.  Follow Up Recommendations Can patient physically be transported by private vehicle: Yes     Assistance Recommended at Discharge Frequent or constant Supervision/Assistance  Patient can return home with the following  A little help with walking and/or transfers;A little help with bathing/dressing/bathroom;Assistance with cooking/housework;Direct supervision/assist for medications management;Direct supervision/assist for financial management;Assist for transportation;Help with stairs or ramp for entrance    Equipment Recommendations None recommended by PT  Recommendations for Other Services       Functional Status Assessment Patient has had a recent decline in their  functional status and demonstrates the ability to make significant improvements in function in a reasonable and predictable amount of time.     Precautions / Restrictions Precautions Precautions: Fall Restrictions Weight Bearing Restrictions: No      Mobility  Bed Mobility               General bed mobility comments: up in chair    Transfers Overall transfer level: Needs assistance Equipment used: Rolling walker (2 wheels) Transfers: Sit to/from Stand Sit to Stand: Min assist           General transfer comment: assist for initial rise and steady, stand x2 from EOB and toilet.    Ambulation/Gait Ambulation/Gait assistance: Min assist Gait Distance (Feet): 12 Feet (x2 - to and from bathroom) Assistive device: Rolling walker (2 wheels) Gait Pattern/deviations: Step-through pattern, Decreased stride length, Trunk flexed Gait velocity: decr     General Gait Details: assist to steady, guide pt to destination as pt asking "where am I going?" when pt already cued to where he was going (bathroom, back to chair)  Stairs            Wheelchair Mobility    Modified Rankin (Stroke Patients Only)       Balance Overall balance assessment: Needs assistance Sitting-balance support: Feet supported, Single extremity supported Sitting balance-Leahy Scale: Fair Sitting balance - Comments: sitting EOB   Standing balance support: Bilateral upper extremity supported, Reliant on assistive device for balance Standing balance-Leahy Scale: Poor Standing balance comment: BUE support on RW for stability                             Pertinent Vitals/Pain Pain Assessment Pain Assessment:  Faces Faces Pain Scale: Hurts little more Pain Location: foley insertion site Pain Descriptors / Indicators: Discomfort, Grimacing Pain Intervention(s): Limited activity within patient's tolerance, Monitored during session, Repositioned    Home Living Family/patient expects to  be discharged to:: Private residence Living Arrangements: Alone Available Help at Discharge: Family;Available PRN/intermittently (ex-wife lives next door) Type of Home: House Home Access: Stairs to enter   Secretary/administrator of Steps: 1   Home Layout: One level Home Equipment: Shower seat Additional Comments: No family present to confirm, pt questionable historian    Prior Function Prior Level of Function : Needs assist;History of Falls (last six months);Patient poor historian/Family not available             Mobility Comments: Pt reports use of RW for mobility. Pt initially states to PT he has had no falls, then adjusts ADLs Comments: Pt reports independence with ADLs, ex-wife assists with IADLs including med management and transportation     Hand Dominance   Dominant Hand: Right    Extremity/Trunk Assessment   Upper Extremity Assessment Upper Extremity Assessment: Defer to OT evaluation    Lower Extremity Assessment Lower Extremity Assessment: Generalized weakness    Cervical / Trunk Assessment Cervical / Trunk Assessment: Kyphotic  Communication   Communication: HOH  Cognition Arousal/Alertness: Awake/alert Behavior During Therapy: Flat affect, Agitated Overall Cognitive Status: History of cognitive impairments - at baseline Area of Impairment: Orientation, Attention, Memory, Following commands, Safety/judgement, Awareness, Problem solving                 Orientation Level: Situation Current Attention Level: Sustained Memory: Decreased recall of precautions, Decreased short-term memory Following Commands: Follows one step commands with increased time Safety/Judgement: Decreased awareness of safety, Decreased awareness of deficits Awareness: Intellectual Problem Solving: Slow processing, Requires verbal cues, Difficulty sequencing General Comments: pt reports he did not have a fall at home, but this is why he was admitted. Pt very disgruntled, states  "leave me alone, go away, no one is letting me rest". Pt lacks insight into current deficits        General Comments General comments (skin integrity, edema, etc.): 2LO2, removed for short distance gait to bathroom, sPO2 91% post-gait in room on RA    Exercises     Assessment/Plan    PT Assessment Patient needs continued PT services  PT Problem List Cardiopulmonary status limiting activity;Decreased activity tolerance;Decreased balance;Decreased mobility;Decreased cognition;Decreased strength;Decreased safety awareness;Decreased knowledge of use of DME;Pain       PT Treatment Interventions Gait training;Stair training;Functional mobility training;Therapeutic exercise;Therapeutic activities;Balance training;Patient/family education;DME instruction    PT Goals (Current goals can be found in the Care Plan section)  Acute Rehab PT Goals PT Goal Formulation: With patient Time For Goal Achievement: 04/06/23 Potential to Achieve Goals: Good    Frequency Min 3X/week     Co-evaluation               AM-PAC PT "6 Clicks" Mobility  Outcome Measure Help needed turning from your back to your side while in a flat bed without using bedrails?: A Little Help needed moving from lying on your back to sitting on the side of a flat bed without using bedrails?: A Little Help needed moving to and from a bed to a chair (including a wheelchair)?: A Little Help needed standing up from a chair using your arms (e.g., wheelchair or bedside chair)?: A Little Help needed to walk in hospital room?: A Little Help needed climbing 3-5 steps with a railing? :  A Lot 6 Click Score: 17    End of Session Equipment Utilized During Treatment: Oxygen Activity Tolerance: Patient tolerated treatment well Patient left: in chair;with call bell/phone within reach;with chair alarm set Nurse Communication: Mobility status PT Visit Diagnosis: Other abnormalities of gait and mobility (R26.89)    Time:  1037-1050 PT Time Calculation (min) (ACUTE ONLY): 13 min   Charges:   PT Evaluation $PT Eval Low Complexity: 1 Low          Bernarr Longsworth S, PT DPT Acute Rehabilitation Services Secure Chat Preferred  Office [phone]   Eleisha Branscomb E Stroup 03/23/2023, 11:07 AM

## 2023-03-23 NOTE — Progress Notes (Signed)
Pt sleeping in chair. Nurse attempted med pass at 2100. Pt stated medications make him throw up and refused to take without PRN zofran. PRN zofran due at 2249, nurse attempted med pass again with zofran. Pt refused meds and nausea medicine stating "why do you keep bothering me, I want to be left alone." Pt refused education and requested lights to be turned off. MD notified. Chair alarm on, will continue with POC.

## 2023-03-24 DIAGNOSIS — I480 Paroxysmal atrial fibrillation: Secondary | ICD-10-CM | POA: Diagnosis not present

## 2023-03-24 DIAGNOSIS — R531 Weakness: Secondary | ICD-10-CM

## 2023-03-24 DIAGNOSIS — G3184 Mild cognitive impairment, so stated: Secondary | ICD-10-CM | POA: Diagnosis not present

## 2023-03-24 DIAGNOSIS — I5022 Chronic systolic (congestive) heart failure: Secondary | ICD-10-CM

## 2023-03-24 DIAGNOSIS — Y92009 Unspecified place in unspecified non-institutional (private) residence as the place of occurrence of the external cause: Secondary | ICD-10-CM | POA: Diagnosis not present

## 2023-03-24 DIAGNOSIS — W19XXXA Unspecified fall, initial encounter: Secondary | ICD-10-CM | POA: Diagnosis not present

## 2023-03-24 DIAGNOSIS — I10 Essential (primary) hypertension: Secondary | ICD-10-CM | POA: Diagnosis not present

## 2023-03-24 DIAGNOSIS — R319 Hematuria, unspecified: Secondary | ICD-10-CM

## 2023-03-24 LAB — GLUCOSE, CAPILLARY
Glucose-Capillary: 97 mg/dL (ref 70–99)
Glucose-Capillary: 127 mg/dL — ABNORMAL HIGH (ref 70–99)
Glucose-Capillary: 140 mg/dL — ABNORMAL HIGH (ref 70–99)
Glucose-Capillary: 157 mg/dL — ABNORMAL HIGH (ref 70–99)

## 2023-03-24 LAB — CULTURE, BLOOD (SINGLE): Special Requests: ADEQUATE

## 2023-03-24 MED ORDER — ENSURE ENLIVE PO LIQD
237.0000 mL | Freq: Two times a day (BID) | ORAL | Status: DC
  Administered 2023-03-25 – 2023-03-26 (×4): 237 mL via ORAL

## 2023-03-24 MED ORDER — SULFAMETHOXAZOLE-TRIMETHOPRIM 400-80 MG PO TABS
1.0000 | ORAL_TABLET | Freq: Two times a day (BID) | ORAL | Status: AC
  Administered 2023-03-24 – 2023-03-25 (×3): 1 via ORAL
  Filled 2023-03-24 (×3): qty 1

## 2023-03-24 MED ORDER — ADULT MULTIVITAMIN W/MINERALS CH
1.0000 | ORAL_TABLET | Freq: Every day | ORAL | Status: DC
  Administered 2023-03-24 – 2023-03-27 (×4): 1 via ORAL
  Filled 2023-03-24 (×4): qty 1

## 2023-03-24 NOTE — Progress Notes (Signed)
PROGRESS NOTE  Christian [name]  DOB: 1940-11-30  PCP: [name], [place] FAO:[id number]  DOA: 03/22/2023  LOS: 0 days  Hospital Day: 3  Brief narrative: Christian [name] is a 82 y.o. male with PMH significant for DM2, HTN, HLD, CAD, chronic systolic CHF, moderate pulmonary hypertension, GERD, dysphagia, cognitive impairment, anxiety/depression who lives alone at home, his wife lives next-door. Recently hospitalized 5/18 to 6/1 for worsening shortness of breath, diagnosed with bilateral PE, acute CHF exacerbation, required dobutamine drip by advanced heart failure service.  He was recommended supplement oxygen for ambulation which he refused.  At some point during the hospital, he also endorsed suicidal ideation and psychiatry was consulted but did not require inpatient psych hospitalization. He was recommended SNF by PT but insurance did not approve and hence he was discharged home 6/1.  6/2, within 24 hours of discharge, patient was brought to the ED by EMS after he was found on the floor with blood all over him.  Patient reported that his socks/shoes were slippery and he fell.  He tried to get up and was unable, his Foley got pulled out in the process and started bleeding.  His next-door family found him on the ground with blood everywhere.  Patient was incoherent.  EMS brought him to ED.  In the ED, patient was afebrile, heart rate in 70s, blood pressure in 90s, on 4 L oxygen Labs with unremarkable CBC, BMP with sodium 134, creatinine 23/1.47, lactic acid 2.1 ABG normal. CT head did not show any acute abnormality, showed chronic microvascular ischemic changes and remote right occipital infarct Chest x-ray showed bilateral increased interstitial markings suggestive of interstitial edema or multifocal interstitial pneumonia. X-ray pelvis did not show any evidence of fracture. Foley catheter was changed in the ED Admitted to [facility]  Subjective: Patient was seen and examined this morning. Sitting  up in recliner. RN was doing bladder scan as patient had not had output in the catheter.  More than 670 mm noted in the bladder scan.  Irrigation was tried but his Foley catheter came out and he started having hematuria.   Urology consulted for irrigation  Also spoke with patient's sister-in-law Ms. Sarah at bedside this morning.  Assessment and plan: Fall Impaired mobility Patient has impaired mobility at baseline, recently seen by PT and was recommended SNF but insurance did not approve. Readmitted after a fall at home.  Unclear circumstances.  Patient states he had slippery shocks.   He does not have 24 hour supervision and his ex-wife believes that he is unsafe at home and needs placement PT recommended SNF.  TOC consulted  Hematuria Secondary to traumatic Foley removal at home while on Eliquis. He required Foley catheter insertion during last hospitalization for acute urinary retention and was discharged with the same. Foley catheter was changed in the ED. hematuria was controlled but it is severe again this morning.  Urology consulted for possible need of CBI. Patient is on Eliquis since last hospitalization for PE.  He however has been refusing to take it.  I took off the order this morning anyway because of new bleeding. Continue monitor Continue Flomax   Chronic CHF Hypotension EF less than 20% at least since 2018 which had eventually improved but echo during recent hospitalization showed EF down to less than 20% again.  Seen by advanced heart failure, required IV diuresis and dobutamine drip. PTA, he was on Lasix 40 mg daily, Aldactone 12.5 mg daily.  Volume status currently stable.  While in the  ED, he was given IV Lasix.  Creatinine went up this morning.  I would keep oral Lasix and Aldactone on hold for now.   Afib Rate controlled with amiodarone Eliquis plan as above   Recent acute PE Diagnosed during last hospitalization Eliquis plan as above Check oxygen  requirement Continue albuterol  Type 2 diabetes mellitus A1c 7.6 on January 2024 PTA not on meds.  Target A1c of 8 is appropriate for him Currently on sliding scale insulin with Accu-Cheks Recent Labs  Lab 03/20/23 1602 03/20/23 2101 03/21/23 0555 03/22/23 1042 03/24/23 0809  GLUCAP 162* 127* 94 107* 97    CAD HLD Currently on Eliquis and Zetia   Cognitive impairment Noted during last hospitalization when he also had suicidal ideation and was seen by psychiatry. Continue delirium precautions Continue escitalopram   Goals of care   Code Status: DNR     DVT prophylaxis:    Antimicrobials: None Fluid: None Consultants: None Family Communication: Sister-in-law Sarah at bedside  Status: Observation Level of care:  Med-Surg   Patient from: Home Anticipated d/c to: Pending clinical course Needs to continue in-hospital care:  Continues to have hematuria.  Needs SNF    Diet:  Diet Order             Diet Heart Room service appropriate? Yes; Fluid consistency: Thin; Fluid restriction: 1800 mL Fluid  Diet effective now                   Scheduled Meds:  amiodarone  200 mg Oral BID   Chlorhexidine Gluconate Cloth  6 each Topical Daily   docusate sodium  100 mg Oral BID   escitalopram  20 mg Oral QHS   ezetimibe  10 mg Oral Daily   pantoprazole  40 mg Oral Daily   rosuvastatin  40 mg Oral Daily   tamsulosin  0.4 mg Oral QPC supper    PRN meds: acetaminophen **OR** acetaminophen, albuterol, bisacodyl, hydrALAZINE, ondansetron **OR** ondansetron (ZOFRAN) IV, polyethylene glycol   Infusions:    Antimicrobials: Anti-infectives (From admission, onward)    None       Nutritional status:  There is no height or weight on file to calculate BMI.          Objective: Vitals:   03/24/23 0336 03/24/23 0807  BP: 90/65 103/71  Pulse: 83 79  Resp: 15 18  Temp: 97.6 F (36.4 C) (!) 97.4 F (36.3 C)  SpO2: 100% 98%    Intake/Output Summary  (Last 24 hours) at 03/24/2023 1054 Last data filed at 03/24/2023 0330 Gross per 24 hour  Intake 115 ml  Output --  Net 115 ml    There were no vitals filed for this visit. Weight change:  There is no height or weight on file to calculate BMI.   Physical Exam: General exam: Elderly Caucasian male.  Not in distress.  Not in pain Skin: No rashes, lesions or ulcers. HEENT: Atraumatic, normocephalic, no obvious bleeding Lungs: Clear to auscultation bilaterally CVS: Regular rate and rhythm, no murmur GI/Abd soft, nontender, nondistended, bowel sound present.  Hematuria present CNS: Alert, awake, oriented to place Psychiatry: Mood appropriate this morning. Extremities: No pedal edema, no calf tenderness  Data Review: I have personally reviewed the laboratory data and studies available.  F/u labs ordered Unresulted Labs (From admission, onward)     Start     Ordered   03/22/23 1716  Blood gas, arterial (at [facility])  Once,   R  03/22/23 1715   03/22/23 1027  Urinalysis, Routine w reflex microscopic -Urine, Clean Catch  Once,   URGENT       Question:  Specimen Source  Answer:  Urine, Clean Catch   03/22/23 1026            Total time spent in review of labs and imaging, patient evaluation, formulation of plan, documentation and communication with family: 45 minutes  Signed, Lorin Glass, MD [facility] 03/24/2023

## 2023-03-24 NOTE — Consult Note (Addendum)
Consultation Note Date: 03/24/2023   Patient Name: Christian [name]  DOB: 04/03/1941  MRN: [id number]  Age / Sex: 82 y.o., male  PCP: Christian [name], [place] Referring Physician: Lorin Glass, MD  Reason for Consultation: Establishing goals of care and Psychosocial/spiritual support  HPI/Patient Profile: 82 y.o. male   admitted on 03/22/2023 with  medical history significant of chronic systolic CHF, CAD, HTN, HLD, and DM presenting with a fall, found down.     He was last admitted from 5/18-6/1 with acute on chronic systolic CHF, afib, and acute PE.   Discharged on 03/21/2023.  Reported fall, and Foley being accidentally pulled out.  Patient's ex wife/main decision maker and support/ Christian [name] has been out of town and unable to check on him most recently.  Palliative medicine consulted on this patient on last admission.  Disposition complicated by limited options and limited support in the home.   Patient and family face treatment option decisions, advanced directive decisions and anticipatory care needs.        Clinical Assessment and Goals of Care:   This NP Christian [name] reviewed medical records, received report from team, assessed the patient and then meet at the patient's bedside  to discuss diagnosis, prognosis, GOC, EOL wishes disposition and options.  I then spoke to Christian [name] by telephone.  She is on her way back to [place]. She is concerned that patient is not safe to be at home alone.   Concept of Palliative Care was re-introduced as specialized medical care for people and their families living with serious illness.  If focuses on providing relief from the symptoms and stress of a serious illness.  The goal is to improve quality of life for both the patient and the family.   Values and goals of care important to patient and family were attempted to be elicited.  Discussion had today on  the importance of conversation and documentation of advance care planning wishes now and into the future.    Discussion had with patient today regarding his limited support in the home and concerns for safety.  Patient continues to verbalize his desire to be at home" I am fine at home".  I worry about his insight into his overall medical conditions and likely increasing care needs and safety at home  Detailed education regarding hospice benefit; loss of for ineligibility offered on last admission   A  discussion was had today regarding advanced directives.  Concepts specific to code status, artifical feeding and hydration, continued IV antibiotics and rehospitalization was had.    The difference between a aggressive medical intervention path  and a palliative comfort care path for this patient at this time was had.     Stressed the importance of re-examining and completing a MOST form    Natural trajectory and expectations at EOL were discussed.  Questions and concerns addressed.  Patient  encouraged to call with questions or concerns.     PMT will continue to support holistically.       Will  need to secure paperwork naming H POA for EMR      SUMMARY OF RECOMMENDATIONS    Code Status/Advance Care Planning: DNR   Plan is to meet tomorrow morning at 10:00 for family meeting   Palliative Prophylaxis:  Aspiration, Bowel Regimen, Delirium Protocol, Frequent Pain Assessment, and Oral Care  Additional Recommendations (Limitations, Scope, Preferences): Full Scope Treatment  Psycho-social/Spiritual:  Desire for further Chaplaincy support:no Additional Recommendations: Education on Hospice  Prognosis:  < 6 months-will depend on decisions  for life prolonging measures  Discharge Planning: To Be Determined      Primary Diagnoses: Present on Admission:  Type 2 diabetes mellitus with hyperlipidemia (HCC)  Paroxysmal atrial fibrillation (HCC)  Hyperlipidemia  MCI (mild  cognitive impairment)  Coronary artery disease involving native coronary artery of native heart without angina pectoris  Chronic systolic congestive heart failure (HCC)  Benign essential HTN  Acute pulmonary embolism (HCC)  Acute urinary retention   I have reviewed the medical record, interviewed the patient and family, and examined the patient. The following aspects are pertinent.  Past Medical History:  Diagnosis Date   Allergic rhinitis due to pollen 03/29/2018   Chronic systolic heart failure (HCC) - EF improved to normal on Rx    Echo 11/18: Mild LVH, EF 20, diffuse HK mild AI, a sending aorta 42 mm (mildly dilated), mild MR, mild TR, trivial PI, PASP 44 // Echo 3/19: mild LVH, EF 25-30, diff HK, Gr 1 DD, asc aorta 43 mm (mildly dilated), MAC, mild reduced RVSF // Limited echo 10/19: Mild concentric LVH, EF 60-65, grade 1 diastolic dysfunction, MAC, mild LAE    Coronary artery disease involving native coronary artery of native heart without angina pectoris 11/11/2017   R/L HC 11/18: pLAD 45, oD1 65 ; elevated PCWP (mean 29), moderate pulmonary hypertension, CO 3.66, CI 2.07   Depression with anxiety 07/08/2017   Essential hypertension 07/08/2017   GERD with esophagitis 01/11/2018   Hernia of abdominal cavity    Hyperlipidemia LDL goal <130 05/25/2017   The 10-year ASCVD risk score Denman George DC Jr., et al., 2013) is: 31.1%   Values used to calculate the score:     Age: 9 years     Sex: Male     Is Non-Hispanic African American: No     Diabetic: No     Tobacco smoker: No     Systolic Blood Pressure: 140 mmHg     Is BP treated: No     HDL Cholesterol: 68.7 mg/dL     Total Cholesterol: 258 mg/dL    Idiopathic inflammatory myopathy 05/25/2017   Neuropathy involving both lower extremities 05/25/2017   Nonischemic cardiomyopathy (HCC) 11/11/2017   LHC 09/16/17-nonobstructive CAD   Oropharyngeal dysphagia 01/11/2018   Type 2 diabetes mellitus with complication, without long-term current use of  insulin (HCC) 07/08/2017   Social History   Socioeconomic History   Marital status: Single    Spouse name: Not on file   Number of children: Not on file   Years of education: Not on file   Highest education level: Not on file  Occupational History   Not on file  Tobacco Use   Smoking status: Never   Smokeless tobacco: Never  Vaping Use   Vaping Use: Never used  Substance and Sexual Activity   Alcohol use: No   Drug use: No   Sexual activity: Not Currently  Other Topics Concern   Not on file  Social History Narrative   [employer]  Social Determinants of Health   Financial Resource Strain: Low Risk  (03/10/2023)   Overall Financial Resource Strain (CARDIA)    Difficulty of Paying Living Expenses: Not hard at all  Food Insecurity: No Food Insecurity (03/10/2023)   Hunger Vital Sign    Worried About Running Out of Food in the Last Year: Never true    Ran Out of Food in the Last Year: Never true  Transportation Needs: No Transportation Needs (03/10/2023)   PRAPARE - Administrator, Civil Service (Medical): No    Lack of Transportation (Non-Medical): No  Physical Activity: Not on file  Stress: Not on file  Social Connections: Not on file   Family History  Problem Relation Age of Onset   Stroke Mother    Kidney disease Father    Stroke Father    Hypertension Father    Early death Father    Cancer Neg Hx    Alcohol abuse Neg Hx    Diabetes Neg Hx    Heart disease Neg Hx    Hyperlipidemia Neg Hx    Scheduled Meds:  amiodarone  200 mg Oral BID   apixaban  5 mg Oral BID   Chlorhexidine Gluconate Cloth  6 each Topical Daily   docusate sodium  100 mg Oral BID   escitalopram  20 mg Oral QHS   ezetimibe  10 mg Oral Daily   pantoprazole  40 mg Oral Daily   rosuvastatin  40 mg Oral Daily   tamsulosin  0.4 mg Oral QPC supper   Continuous Infusions: PRN Meds:.acetaminophen **OR** acetaminophen, albuterol, bisacodyl, hydrALAZINE, ondansetron **OR**  ondansetron (ZOFRAN) IV, polyethylene glycol Medications Prior to Admission:  Prior to Admission medications   Medication Sig Start Date End Date Taking? Authorizing Provider  acetaminophen (TYLENOL) 325 MG tablet Take 2 tablets (650 mg total) by mouth every 4 (four) hours as needed for mild pain (or temp > 37.5 C (99.5 F)). 03/07/20  Yes Angiulli, Mcarthur Rossetti, PA-C  albuterol (VENTOLIN HFA) 108 (90 Base) MCG/ACT inhaler Inhale 2 puffs into the lungs every 6 (six) hours as needed for wheezing or shortness of breath. 04/29/22  Yes Janeece Agee, NP  amiodarone (PACERONE) 200 MG tablet Take 1 tablet (200 mg total) by mouth 2 (two) times daily. 03/21/23 04/20/23 Yes Arrien, York Ram, MD  apixaban (ELIQUIS) 5 MG TABS tablet Take 1 tablet (5 mg total) by mouth 2 (two) times daily. 03/21/23  Yes Arrien, York Ram, MD  escitalopram (LEXAPRO) 20 MG tablet Take 1 tablet (20 mg total) by mouth daily. Patient taking differently: Take 20 mg by mouth at bedtime. 10/27/22  Yes Christian Motto, PA  esomeprazole (NEXIUM) 40 MG capsule TAKE 1 CAPSULE BY MOUTH DAILY AS NEEDED FOR HEARTBURN Patient taking differently: Take 40 mg by mouth daily before breakfast. 10/27/22  Yes Christian Motto, PA  ezetimibe (ZETIA) 10 MG tablet Take 1 tablet (10 mg total) by mouth daily. 04/09/21  Yes Nahser, Deloris Ping, MD  furosemide (LASIX) 40 MG tablet Take 1 tablet (40 mg total) by mouth daily. 03/21/23  Yes Arrien, York Ram, MD  hydrOXYzine (ATARAX) 10 MG tablet Take 1 tablet (10 mg total) by mouth 2 (two) times daily as needed for itching or anxiety. 01/22/23  Yes Worley, Lelon Mast, PA  olopatadine (PATANOL) 0.1 % ophthalmic solution INSTILL 1 DROP INTO BOTH EYS DAILY AS NEEDED FOR ALLERGIES 03/20/20  Yes Janeece Agee, NP  ondansetron (ZOFRAN-ODT) 4 MG disintegrating tablet TAKE 2 TABLETS BY MOUTH AS  NEEDED 12/31/22  Yes Christian Motto, PA  OXYGEN Inhale into the lungs.   Yes [provider]  potassium chloride SA  (KLOR-CON M) 10 MEQ tablet Take 1 tablet (10 mEq total) by mouth daily. 03/21/23  Yes Arrien, York Ram, MD  rosuvastatin (CRESTOR) 40 MG tablet Take 1 tablet (40 mg total) by mouth daily. 10/29/22  Yes Christian Motto, PA  spironolactone (ALDACTONE) 25 MG tablet Take 1/2 tablet (12.5 mg total) by mouth daily. 03/21/23 04/20/23 Yes Arrien, York Ram, MD  tamsulosin (FLOMAX) 0.4 MG CAPS capsule Take 1 capsule (0.4 mg total) by mouth daily after supper. 03/21/23  Yes Arrien, York Ram, MD  cholecalciferol (VITAMIN D) 1000 units tablet Take 1 tablet (1,000 Units total) by mouth daily. 03/07/20   Angiulli, Mcarthur Rossetti, PA-C   Allergies  Allergen Reactions   Lipitor [Atorvastatin] Other (See Comments)    Damage to calf muscles   Vytorin [Ezetimibe-Simvastatin] Other (See Comments)    Damage to calf muscles   Amoxicillin Hives   Cephalexin Itching   Statins Other (See Comments)    Muscle pain    Tape Rash and Other (See Comments)    Broke out the skin   Review of Systems  Genitourinary:  Positive for hematuria.  Neurological:  Positive for weakness.    Physical Exam Cardiovascular:     Rate and Rhythm: Normal rate.  Pulmonary:     Effort: Pulmonary effort is normal.  Genitourinary:    Comments: -Noted bloody drainage on towels wrapped around penis Musculoskeletal:     Comments: Generalized weakness  Neurological:     Mental Status: He is alert.     Vital Signs: BP 103/71 (BP Location: Left Arm)   Pulse 79   Temp (!) 97.4 F (36.3 C)   Resp 18   SpO2 98%  Pain Scale: 0-10   Pain Score: 0-No pain   SpO2: SpO2: 98 % O2 Device:SpO2: 98 % O2 Flow Rate: .O2 Flow Rate (L/min): 2 L/min  IO: Intake/output summary:  Intake/Output Summary (Last 24 hours) at 03/24/2023 0947 Last data filed at 03/24/2023 0330 Gross per 24 hour  Intake 115 ml  Output --  Net 115 ml    LBM: Last BM Date : 03/21/23 Baseline Weight:   Most recent weight:       Palliative Assessment/Data:   40 % at best       Time 75 minutes   Signed by: Christian Creed, NP   Please contact Palliative Medicine Team phone at [phone] for questions and concerns.  For individual provider: See Loretha Stapler

## 2023-03-24 NOTE — TOC Initial Note (Addendum)
Transition of Care [facility]) - Initial/Assessment Note    Patient Details  Name: Christian [name] MRN: [id number] Date of Birth: 06/28/1941  Transition of Care [facility]) CM/SW Contact:    Christian [name] Christian [name], Christian [name] Phone Number: 03/24/2023, 10:15 AM  Clinical Narrative:                 CSW followed back up with pt regarding interest in SNF. He stated "ok" for recommendation. CSW was able to speak with Christian [name] on the phone as well with Christian [name] and pt. They all agreed that [facility] would be the preference for SNF. CSW will complete SNF work up. Discharge pending SNF bed offer, insurance authorization, and continued medical work up and   TOC will continue to follow.   CSW met with pt and pt's sister in law, Christian [name], at bedside. Pt had Christian medical need and nurse was attending at bedside and was unable to converse with CSW. Christian [name] spoke for pt with pt's permission and informed CSW that pt was unable to care for himself at home currently. She said that pt's ex-wife Christian [name] had more details regarding pt's care during his last admission and previous SNF recommendation.  She stated that CSW needed to follow up with her as well. Lastly, she informed CSW that pt and pt's family wanted Christian [name] for SNF.   CSW proceeded to contact [place] after conversation at bedside. There was no answer.   CSW will follow up with pt at Christian more opportune time regarding SNF referral.    TOC will continue to follow.   Expected Discharge Plan: Skilled Nursing Facility Barriers to Discharge: Continued Medical Work up, SNF Pending bed offer, Insurance Authorization   Patient Goals and CMS Choice            Expected Discharge Plan and Services       Living arrangements for the past 2 months: Single Family Home                                      Prior Living Arrangements/Services Living arrangements for the past 2 months: Single Family Home Lives with:: Self                   Activities of Daily  Living Home Assistive Devices/Equipment: None ADL Screening (condition at time of admission) Patient's cognitive ability adequate to safely complete daily activities?: Yes Is the patient deaf or have difficulty hearing?: Yes Does the patient have difficulty seeing, even when wearing glasses/contacts?: No Does the patient have difficulty concentrating, remembering, or making decisions?: Yes Patient able to express need for assistance with ADLs?: Yes Does the patient have difficulty dressing or bathing?: Yes Independently performs ADLs?: No Communication: Independent Dressing (OT): Needs assistance Is this Christian change from baseline?: Pre-admission baseline Grooming: Needs assistance Is this Christian change from baseline?: Pre-admission baseline Feeding: Independent Bathing: Needs assistance Is this Christian change from baseline?: Pre-admission baseline Toileting: Independent In/Out Bed: Independent Walks in Home: Independent Does the patient have difficulty walking or climbing stairs?: No Weakness of Legs: Both Weakness of Arms/Hands: None  Permission Sought/Granted                  Emotional Assessment Appearance:: Appears older than stated age Attitude/Demeanor/Rapport: Lethargic Affect (typically observed): Quiet Orientation: : Oriented to Self, Oriented to Place Alcohol / Substance Use: Not Applicable Psych Involvement: No (comment)  Admission diagnosis:  Weakness [R53.1] Failure to thrive in adult [R62.7] Fall, initial encounter [W19.XXXA] Patient Active Problem List   Diagnosis Date Noted   Fall at home, initial encounter 03/22/2023   AKI (acute kidney injury) (HCC) 03/21/2023   Acute urinary retention 03/10/2023   Liver lesion 03/09/2023   Acute on chronic systolic CHF (congestive heart failure) (HCC) 03/07/2023   Cerebrovascular disease 03/07/2023   Acute pulmonary embolism (HCC) 03/07/2023   Paroxysmal atrial fibrillation (HCC) 03/07/2023   Mood disorder (HCC) 03/07/2023    Nausea without vomiting 04/29/2022   GAD (generalized anxiety disorder) 04/29/2022   Rash and nonspecific skin eruption 04/29/2022   Allergic conjunctivitis of both eyes    Stage 2 chronic kidney disease    Benign essential HTN    Chronic systolic congestive heart failure (HCC)    Acute ischemic right posterior cerebral artery (PCA) stroke (HCC) 03/01/2020   Allergic rhinitis due to pollen 03/29/2018   Oropharyngeal dysphagia 01/11/2018   GERD with esophagitis 01/11/2018   Claudication of both lower extremities (HCC) 01/06/2018   Nonischemic cardiomyopathy (HCC) 11/11/2017   Coronary artery disease involving native coronary artery of native heart without angina pectoris 11/11/2017   MCI (mild cognitive impairment) 07/08/2017   Type 2 diabetes mellitus with hyperlipidemia (HCC) 07/08/2017   Hyperlipidemia 05/25/2017   PCP:  [name], PA Pharmacy:   [facility] - [place], [place] - [address] [place] [address] Phone: [phone] Fax: [phone]     Social Determinants of Health (SDOH) Social History: SDOH Screenings   Food Insecurity: No Food Insecurity (03/10/2023)  Housing: Low Risk  (03/10/2023)  Transportation Needs: No Transportation Needs (03/10/2023)  Utilities: Not At Risk (03/10/2023)  Alcohol Screen: Low Risk  (10/12/2019)  Depression (PHQ2-9): Medium Risk (04/29/2022)  Financial Resource Strain: Low Risk  (03/10/2023)  Tobacco Use: Low Risk  (03/22/2023)   SDOH Interventions:     Readmission Risk Interventions     No data to display

## 2023-03-24 NOTE — Progress Notes (Signed)
Name: Christian [name] DOB: 12/04/1940 Please be advised that the above-named patient will require a short-term nursing home stay -- anticipated 30 days or less for rehabilitation and strengthening. The plan is for return home.  

## 2023-03-24 NOTE — Progress Notes (Addendum)
Initial Nutrition Assessment  DOCUMENTATION CODES:   Not applicable  INTERVENTION:  - liberalize to Regular diet, 1800 mL fluid.   - add Ensure Enlive po BID, each supplement provides 350 kcal and 20 grams of protein.  - add MVI q day.   NUTRITION DIAGNOSIS:   Inadequate oral intake related to poor appetite as evidenced by meal completion < 50%.  GOAL:   Patient will meet greater than or equal to 90% of their needs  MONITOR:   PO intake  REASON FOR ASSESSMENT:   Consult Assessment of nutrition requirement/status  ASSESSMENT:   82 y.o. male admits related to fall. PMH includes: CHF, CAD, HTN, HLD, and DM.  Meds reviewed: colace. Labs reviewed: BUN/Creatinine high.   The pt was sleeping at time of assessment and would not wake to sound of voice. Per record, pt ate 50% of his breakfast this am. Per record, pt has experienced a 7% wt loss in 1 month. The pt is currently on a heart healthy diet with 1800 mL fluid restriction. RD will liberalize to a regular diet while keeping fluid restriction. Will also add Ensure BID for now and continue to monitor PO intakes. Palliative care consulted and planning for a family meeting tomorrow.   NUTRITION - FOCUSED PHYSICAL EXAM:  Attempt at f/u.   Diet Order:   Diet Order             Diet regular Room service appropriate? Yes; Fluid consistency: Thin; Fluid restriction: 1800 mL Fluid  Diet effective now                   EDUCATION NEEDS:   Not appropriate for education at this time  Skin:  Skin Assessment: Reviewed RN Assessment  Last BM:  03/21/23  Height:   Ht Readings from Last 1 Encounters:  03/10/23 5\' 6"  (1.676 m)    Weight:   Wt Readings from Last 1 Encounters:  03/21/23 66 kg    Ideal Body Weight:     BMI:  There is no height or weight on file to calculate BMI.  Estimated Nutritional Needs:   Kcal:  1650-1980 kcals  Protein:  80-100 gm  Fluid:  >/= 1.6 L  Bethann Humble, RD, LDN, CNSC.

## 2023-03-24 NOTE — Consult Note (Addendum)
Urology Consult  Referring physician: Dr. Pola Corn Reason for referral: Urinary retention, urethral bleeding  Chief Complaint: Urinary retention, urethral bleeding  History of Present Illness: Christian [name] is an 82 year old male presenting to [facility] found down after a fall.  He was recently hospitalized from 5/18 through 6/1 for acute on chronic CHF, A-fib, and acute pulmonary embolism.  He was readmitted within about 24 hours.  PMH significant for systolic CHF, CAD, HTN, and T2DM.  He is alert, oriented, and in moderate discomfort but not distress.  He is quite hard of hearing and does not mention significant he urologic history.  He was discharged from hospital with a Foley catheter in place, somehow tripped and traumatically removed it.  There was an attempt to replace the catheter in the emergency department but this was coiled and balloon inflated in the bulbar urethra or prostate.  Floor nursing attempted to check Foley placement while patient was sitting upright in the chair.  When the catheter was felt to be coiling it was withdrawn and balloon inflated a few inches into the urethra with significant urethral bleeding following.  Please see separate procedure note  Past Medical History:  Diagnosis Date   Allergic rhinitis due to pollen 03/29/2018   Chronic systolic heart failure (HCC) - EF improved to normal on Rx    Echo 11/18: Mild LVH, EF 20, diffuse HK mild AI, a sending aorta 42 mm (mildly dilated), mild MR, mild TR, trivial PI, PASP 44 // Echo 3/19: mild LVH, EF 25-30, diff HK, Gr 1 DD, asc aorta 43 mm (mildly dilated), MAC, mild reduced RVSF // Limited echo 10/19: Mild concentric LVH, EF 60-65, grade 1 diastolic dysfunction, MAC, mild LAE    Coronary artery disease involving native coronary artery of native heart without angina pectoris 11/11/2017   R/L HC 11/18: pLAD 45, oD1 65 ; elevated PCWP (mean 29), moderate pulmonary hypertension, CO 3.66, CI 2.07   Depression with  anxiety 07/08/2017   Essential hypertension 07/08/2017   GERD with esophagitis 01/11/2018   Hernia of abdominal cavity    Hyperlipidemia LDL goal <130 05/25/2017   The 10-year ASCVD risk score Denman George DC Jr., et al., 2013) is: 31.1%   Values used to calculate the score:     Age: 25 years     Sex: Male     Is Non-Hispanic African American: No     Diabetic: No     Tobacco smoker: No     Systolic Blood Pressure: 140 mmHg     Is BP treated: No     HDL Cholesterol: 68.7 mg/dL     Total Cholesterol: 258 mg/dL    Idiopathic inflammatory myopathy 05/25/2017   Neuropathy involving both lower extremities 05/25/2017   Nonischemic cardiomyopathy (HCC) 11/11/2017   LHC 09/16/17-nonobstructive CAD   Oropharyngeal dysphagia 01/11/2018   Type 2 diabetes mellitus with complication, without long-term current use of insulin (HCC) 07/08/2017   Past Surgical History:  Procedure Laterality Date   BACK SURGERY     HERNIA REPAIR     RIGHT/LEFT HEART CATH AND CORONARY ANGIOGRAPHY N/A 09/16/2017   Procedure: RIGHT/LEFT HEART CATH AND CORONARY ANGIOGRAPHY;  Surgeon: [place], Peter M, MD;  Location: [facility]VASIVE [facility] LAB;  Service: Cardiovascular;  Laterality: N/A;   SPINE SURGERY      Medications: I have reviewed the patient's current medications. Allergies:  Allergies  Allergen Reactions   Lipitor [Atorvastatin] Other (See Comments)    Damage to calf muscles   Vytorin [Ezetimibe-Simvastatin]  Other (See Comments)    Damage to calf muscles   Amoxicillin Hives   Cephalexin Itching   Statins Other (See Comments)    Muscle pain    Tape Rash and Other (See Comments)    Broke out the skin    Family History  Problem Relation Age of Onset   Stroke Mother    Kidney disease Father    Stroke Father    Hypertension Father    Early death Father    Cancer Neg Hx    Alcohol abuse Neg Hx    Diabetes Neg Hx    Heart disease Neg Hx    Hyperlipidemia Neg Hx    Social History:  reports that he has never smoked. He  has never used smokeless tobacco. He reports that he does not drink alcohol and does not use drugs.  ROS: All systems are reviewed and negative except as noted. Urinary retention  Physical Exam:  Vital signs in last 24 hours: Temp:  [97.4 F (36.3 C)-97.7 F (36.5 C)] 97.4 F (36.3 C) (06/04 0807) Pulse Rate:  [77-87] 79 (06/04 0807) Resp:  [15-18] 18 (06/04 0807) BP: (90-103)/(63-71) 103/71 (06/04 0807) SpO2:  [94 %-100 %] 98 % (06/04 0807)  Cardiovascular: Skin warm; not flushed Respiratory: Breaths quiet; no shortness of breath Abdomen: No masses, soft, no guarding or rebound Neurological: Normal sensation to touch Musculoskeletal: Normal motor function arms and legs Skin: No rashes Genitourinary:54f coud hematuria catheter in place draining clear light brown urine.  Laboratory Data:  Results for orders placed or performed during the hospital encounter of 03/22/23 (from the past 72 hour(s))  CBC with Differential     Status: Abnormal   Collection Time: 03/22/23 10:26 AM  Result Value Ref Range   WBC 7.6 4.0 - 10.5 K/uL   RBC 4.43 4.22 - 5.81 MIL/uL   Hemoglobin 12.5 (L) 13.0 - 17.0 g/dL   HCT 16.1 09.6 - 04.5 %   MCV 90.7 80.0 - 100.0 fL   MCH 28.2 26.0 - 34.0 pg   MCHC 31.1 30.0 - 36.0 g/dL   RDW 40.9 81.1 - 91.4 %   Platelets 273 150 - 400 K/uL   nRBC 0.0 0.0 - 0.2 %   Neutrophils Relative % 78 %   Neutro Abs 5.9 1.7 - 7.7 K/uL   Lymphocytes Relative 11 %   Lymphs Abs 0.9 0.7 - 4.0 K/uL   Monocytes Relative 8 %   Monocytes Absolute 0.6 0.1 - 1.0 K/uL   Eosinophils Relative 2 %   Eosinophils Absolute 0.1 0.0 - 0.5 K/uL   Basophils Relative 1 %   Basophils Absolute 0.1 0.0 - 0.1 K/uL   Immature Granulocytes 0 %   Abs Immature Granulocytes 0.03 0.00 - 0.07 K/uL    Comment: Performed at [facility], [address], [place], [place] [address]  Troponin I (High Sensitivity)     Status: Abnormal   Collection Time: 03/22/23 10:26 AM  Result Value Ref Range    Troponin I (High Sensitivity) 43 (H) <18 ng/L    Comment: (NOTE) Elevated high sensitivity troponin I (hsTnI) values and significant  changes across serial measurements may suggest ACS but many other  chronic and acute conditions are known to elevate hsTnI results.  Refer to the "Links" section for chest pain algorithms and additional  guidance. Performed at [facility], [address], [place], [place] [address]   Comprehensive metabolic panel     Status: Abnormal  Collection Time: 03/22/23 10:26 AM  Result Value Ref Range   Sodium 134 (L) 135 - 145 mmol/L   Potassium 4.6 3.5 - 5.1 mmol/L   Chloride 96 (L) 98 - 111 mmol/L   CO2 24 22 - 32 mmol/L   Glucose, Bld 117 (H) 70 - 99 mg/dL    Comment: Glucose reference range applies only to samples taken after fasting for at least 8 hours.   BUN 23 8 - 23 mg/dL   Creatinine, Ser 1.61 (H) 0.61 - 1.24 mg/dL   Calcium 9.0 8.9 - 09.6 mg/dL   Total Protein 6.8 6.5 - 8.1 g/dL   Albumin 3.5 3.5 - 5.0 g/dL   AST 28 15 - 41 U/L   ALT 20 0 - 44 U/L   Alkaline Phosphatase 38 38 - 126 U/L   Total Bilirubin 1.5 (H) 0.3 - 1.2 mg/dL   GFR, Estimated 47 (L) >60 mL/min    Comment: (NOTE) Calculated using the CKD-EPI Creatinine Equation (2021)    Anion gap 14 5 - 15    Comment: Performed at [facility], [address], [place], [place] [address]  CK     Status: None   Collection Time: 03/22/23 10:26 AM  Result Value Ref Range   Total CK 87 49 - 397 U/L    Comment: Performed at [facility], [address], [place], [place] [address]  Culture, blood (single)     Status: None (Preliminary result)   Collection Time: 03/22/23 10:39 AM   Specimen: BLOOD LEFT ARM  Result Value Ref Range   Specimen Description BLOOD LEFT ARM    Special Requests      BOTTLES DRAWN AEROBIC ONLY Blood Culture adequate volume   Culture      NO GROWTH < 24 HOURS Performed at [facility], [address], [place], [place] [address]    Report  Status PENDING   Lactic acid, plasma     Status: Abnormal   Collection Time: 03/22/23 10:39 AM  Result Value Ref Range   Lactic Acid, Venous 2.1 (HH) 0.5 - 1.9 mmol/L    Comment: CRITICAL RESULT CALLED TO, READ BACK BY AND VERIFIED WITH V,VALDEZ RN @1117  03/22/23 E,BENTON Performed at [facility], [address], [place], [place] [address]   CBG monitoring, ED     Status: Abnormal   Collection Time: 03/22/23 10:42 AM  Result Value Ref Range   Glucose-Capillary 107 (H) 70 - 99 mg/dL    Comment: Glucose reference range applies only to samples taken after fasting for at least 8 hours.  Lactic acid, plasma     Status: None   Collection Time: 03/22/23 12:31 PM  Result Value Ref Range   Lactic Acid, Venous 1.6 0.5 - 1.9 mmol/L    Comment: Performed at [facility], [address] [place], [place] [address]  Troponin I (High Sensitivity)     Status: Abnormal   Collection Time: 03/22/23 12:31 PM  Result Value Ref Range   Troponin I (High Sensitivity) 37 (H) <18 ng/L    Comment: (NOTE) Elevated high sensitivity troponin I (hsTnI) values and significant  changes across serial measurements may suggest ACS but many other  chronic and acute conditions are known to elevate hsTnI results.  Refer to the "Links" section for chest pain algorithms and additional  guidance. Performed at [facility], [address], [place], [place] [address]   I-Stat arterial blood gas, ED  Status: Abnormal   Collection Time: 03/22/23  5:41 PM  Result Value Ref Range   pH, Arterial 7.434 7.35 - 7.45   pCO2 arterial 39.1 32 - 48 mmHg   pO2, Arterial 55 (L) 83 - 108 mmHg   Bicarbonate 26.5 20.0 - 28.0 mmol/L   TCO2 28 22 - 32 mmol/L   O2 Saturation 91 %   Acid-Base Excess 2.0 0.0 - 2.0 mmol/L   Sodium 136 135 - 145 mmol/L   Potassium 4.0 3.5 - 5.1 mmol/L   Calcium, Ion 1.12 (L) 1.15 - 1.40 mmol/L   HCT 32.0 (L) 39.0 - 52.0 %   Hemoglobin 10.9 (L) 13.0 - 17.0 g/dL   Patient temperature  96.4 F    Collection site RADIAL, ALLEN'S TEST ACCEPTABLE    Drawn by RT    Sample type ARTERIAL   Basic metabolic panel     Status: Abnormal   Collection Time: 03/23/23  8:19 AM  Result Value Ref Range   Sodium 135 135 - 145 mmol/L   Potassium 4.2 3.5 - 5.1 mmol/L   Chloride 97 (L) 98 - 111 mmol/L   CO2 22 22 - 32 mmol/L   Glucose, Bld 154 (H) 70 - 99 mg/dL    Comment: Glucose reference range applies only to samples taken after fasting for at least 8 hours.   BUN 27 (H) 8 - 23 mg/dL   Creatinine, Ser 1.61 (H) 0.61 - 1.24 mg/dL   Calcium 8.2 (L) 8.9 - 10.3 mg/dL   GFR, Estimated 30 (L) >60 mL/min    Comment: (NOTE) Calculated using the CKD-EPI Creatinine Equation (2021)    Anion gap 16 (H) 5 - 15    Comment: Performed at [facility], [address] [place], [place] [address]  CBC     Status: Abnormal   Collection Time: 03/23/23  8:19 AM  Result Value Ref Range   WBC 12.8 (H) 4.0 - 10.5 K/uL   RBC 4.19 (L) 4.22 - 5.81 MIL/uL   Hemoglobin 12.0 (L) 13.0 - 17.0 g/dL   HCT 54.0 (L) 98.1 - 19.1 %   MCV 91.6 80.0 - 100.0 fL   MCH 28.6 26.0 - 34.0 pg   MCHC 31.3 30.0 - 36.0 g/dL   RDW 47.8 29.5 - 62.1 %   Platelets 215 150 - 400 K/uL   nRBC 0.0 0.0 - 0.2 %    Comment: Performed at [facility], [address], [place], [place] [address]  Brain natriuretic peptide     Status: Abnormal   Collection Time: 03/23/23  8:19 AM  Result Value Ref Range   B Natriuretic Peptide 1,758.0 (H) 0.0 - 100.0 pg/mL    Comment: Performed at [facility], [address], [place], [place] [address]  Glucose, capillary     Status: None   Collection Time: 03/24/23  8:09 AM  Result Value Ref Range   Glucose-Capillary 97 70 - 99 mg/dL    Comment: Glucose reference range applies only to samples taken after fasting for at least 8 hours.   Recent Results (from the past 240 hour(s))  Culture, blood (single)     Status: None (Preliminary result)   Collection Time: 03/22/23 10:39 AM    Specimen: BLOOD LEFT ARM  Result Value Ref Range Status   Specimen Description BLOOD LEFT ARM  Final   Special Requests   Final    BOTTLES DRAWN AEROBIC ONLY Blood Culture adequate volume   Culture  Final    NO GROWTH < 24 HOURS Performed at [facility], [address] [place], [place] [address]    Report Status PENDING  Incomplete   Creatinine: Recent Labs    03/19/23 0023 03/21/23 0018 03/22/23 1026 03/23/23 0819  CREATININE 1.08 1.27* 1.47* 2.17*    Imaging: No imaging of the abdomen/pelvis available.  Impression/Assessment/Plan:  Foley catheter to stay in place for 1 week D/T stretch injury and urethral +/- prostate trauma Void trial on an outpatient basis with [facility] within 1 week of discharge. Continue Flomax if tolerated No hematuria. Will not need to follow. Please feel free to call with questions.   Scherrie Bateman Jaheem Hedgepath 03/24/2023, 12:02 PM  Pager: [phone]

## 2023-03-24 NOTE — Progress Notes (Signed)
At approximately 0930 bedside RN bladder scanned patient d/t no output over night, bladder scan read ">665mL". MD Dahal was at bedside, requested RN irrigate foley. RN deflated balloon and noted that catheter was coiled inside urethra. RN pulled back on foley to remove kink, then irrigated foley. MD advised RN to attempt to reinsert foley and then left room. RN attempted to advance foley, but immediately felt resistance so stopped, and attempted to secure foley until a urology consult could be placed, but foley came out. Once foley came out, blood started draining from urethral opening. RN alerted MD and asked for STAT urology consult.  RN continued to check on patient, switching out bloody towels for fresh ones.   At approximately 1130 MD Sattenfield came to bedside to assess patient. Bedside RN then assisted MD in placing new catheter. Once catheter was placed, approximately 1,0100mL brown urine drained into foley bag.

## 2023-03-24 NOTE — Procedures (Signed)
Christian [name] is an 82 year old male seen in consult for traumatic injury to the prostate/urethra following misplaced Foley catheter with associated urinary retention and hematuria.  On assessment patient was alert, oriented, and in moderate discomfort.  He was transferred to the bed.  Explaining the procedure and gaining consent, patient was prepped and draped in the usual sterile fashion.  At this point 10 cc of lidocaine jelly was injected directly into the urethra.  After adequate time for anesthetic effect was provided a 53f coud hematuria catheter was advanced into the bladder with immediate return of clear lightly brown-tinged urine.  Balloon was inflated with 10 cc of sterile water.  No blood was noted in the urine and 1L was returned.  Foley catheter to stay in place for 1 week and treatment of stretch injury and traumatic injury to prostate/urethra.  Third port capped.  CBI not indicated at this time.  Close follow-up with urology outpatient for voiding trial within 1 week.  Elmon Kirschner, NP [facility] Pager: [phone]

## 2023-03-25 ENCOUNTER — Encounter (HOSPITAL_COMMUNITY): Payer: Medicare Other

## 2023-03-25 DIAGNOSIS — I2510 Atherosclerotic heart disease of native coronary artery without angina pectoris: Secondary | ICD-10-CM | POA: Diagnosis present

## 2023-03-25 DIAGNOSIS — T8383XA Hemorrhage of genitourinary prosthetic devices, implants and grafts, initial encounter: Secondary | ICD-10-CM | POA: Diagnosis present

## 2023-03-25 DIAGNOSIS — R531 Weakness: Secondary | ICD-10-CM | POA: Diagnosis present

## 2023-03-25 DIAGNOSIS — Z515 Encounter for palliative care: Secondary | ICD-10-CM | POA: Diagnosis not present

## 2023-03-25 DIAGNOSIS — E785 Hyperlipidemia, unspecified: Secondary | ICD-10-CM | POA: Diagnosis present

## 2023-03-25 DIAGNOSIS — Z880 Allergy status to penicillin: Secondary | ICD-10-CM | POA: Diagnosis not present

## 2023-03-25 DIAGNOSIS — Z823 Family history of stroke: Secondary | ICD-10-CM | POA: Diagnosis not present

## 2023-03-25 DIAGNOSIS — R339 Retention of urine, unspecified: Secondary | ICD-10-CM | POA: Diagnosis present

## 2023-03-25 DIAGNOSIS — Z881 Allergy status to other antibiotic agents status: Secondary | ICD-10-CM | POA: Diagnosis not present

## 2023-03-25 DIAGNOSIS — W19XXXA Unspecified fall, initial encounter: Secondary | ICD-10-CM | POA: Diagnosis not present

## 2023-03-25 DIAGNOSIS — I10 Essential (primary) hypertension: Secondary | ICD-10-CM | POA: Diagnosis not present

## 2023-03-25 DIAGNOSIS — R627 Adult failure to thrive: Secondary | ICD-10-CM | POA: Diagnosis present

## 2023-03-25 DIAGNOSIS — G3184 Mild cognitive impairment, so stated: Secondary | ICD-10-CM | POA: Diagnosis present

## 2023-03-25 DIAGNOSIS — Z66 Do not resuscitate: Secondary | ICD-10-CM | POA: Diagnosis present

## 2023-03-25 DIAGNOSIS — E1169 Type 2 diabetes mellitus with other specified complication: Secondary | ICD-10-CM | POA: Diagnosis present

## 2023-03-25 DIAGNOSIS — E876 Hypokalemia: Secondary | ICD-10-CM | POA: Diagnosis present

## 2023-03-25 DIAGNOSIS — Z888 Allergy status to other drugs, medicaments and biological substances status: Secondary | ICD-10-CM | POA: Diagnosis not present

## 2023-03-25 DIAGNOSIS — I428 Other cardiomyopathies: Secondary | ICD-10-CM | POA: Diagnosis present

## 2023-03-25 DIAGNOSIS — I110 Hypertensive heart disease with heart failure: Secondary | ICD-10-CM | POA: Diagnosis present

## 2023-03-25 DIAGNOSIS — I959 Hypotension, unspecified: Secondary | ICD-10-CM | POA: Diagnosis present

## 2023-03-25 DIAGNOSIS — R338 Other retention of urine: Secondary | ICD-10-CM | POA: Diagnosis not present

## 2023-03-25 DIAGNOSIS — F32A Depression, unspecified: Secondary | ICD-10-CM | POA: Diagnosis present

## 2023-03-25 DIAGNOSIS — R45851 Suicidal ideations: Secondary | ICD-10-CM | POA: Diagnosis present

## 2023-03-25 DIAGNOSIS — W010XXA Fall on same level from slipping, tripping and stumbling without subsequent striking against object, initial encounter: Secondary | ICD-10-CM | POA: Diagnosis present

## 2023-03-25 DIAGNOSIS — Z79899 Other long term (current) drug therapy: Secondary | ICD-10-CM | POA: Diagnosis not present

## 2023-03-25 DIAGNOSIS — I5022 Chronic systolic (congestive) heart failure: Secondary | ICD-10-CM | POA: Diagnosis present

## 2023-03-25 DIAGNOSIS — I480 Paroxysmal atrial fibrillation: Secondary | ICD-10-CM | POA: Diagnosis present

## 2023-03-25 DIAGNOSIS — Y92009 Unspecified place in unspecified non-institutional (private) residence as the place of occurrence of the external cause: Secondary | ICD-10-CM | POA: Diagnosis not present

## 2023-03-25 DIAGNOSIS — I272 Pulmonary hypertension, unspecified: Secondary | ICD-10-CM | POA: Diagnosis present

## 2023-03-25 LAB — HEMOGLOBIN A1C
Hgb A1c MFr Bld: 7 % — ABNORMAL HIGH (ref 4.8–5.6)
Mean Plasma Glucose: 154 mg/dL

## 2023-03-25 LAB — BASIC METABOLIC PANEL
Anion gap: 11 (ref 5–15)
BUN: 34 mg/dL — ABNORMAL HIGH (ref 8–23)
CO2: 28 mmol/L (ref 22–32)
Calcium: 7.9 mg/dL — ABNORMAL LOW (ref 8.9–10.3)
Chloride: 94 mmol/L — ABNORMAL LOW (ref 98–111)
Creatinine, Ser: 1.98 mg/dL — ABNORMAL HIGH (ref 0.61–1.24)
GFR, Estimated: 33 mL/min — ABNORMAL LOW (ref >60)
Glucose, Bld: 114 mg/dL — ABNORMAL HIGH (ref 70–99)
Potassium: 3.2 mmol/L — ABNORMAL LOW (ref 3.5–5.1)
Sodium: 133 mmol/L — ABNORMAL LOW (ref 135–145)

## 2023-03-25 LAB — GLUCOSE, CAPILLARY
Glucose-Capillary: 118 mg/dL — ABNORMAL HIGH (ref 70–99)
Glucose-Capillary: 115 mg/dL — ABNORMAL HIGH (ref 70–99)
Glucose-Capillary: 154 mg/dL — ABNORMAL HIGH (ref 70–99)

## 2023-03-25 LAB — CBC WITH DIFFERENTIAL/PLATELET
Abs Immature Granulocytes: 0.03 10*3/uL (ref 0.00–0.07)
Basophils Absolute: 0.1 10*3/uL (ref 0.0–0.1)
Basophils Relative: 1 %
Eosinophils Absolute: 0.3 10*3/uL (ref 0.0–0.5)
Eosinophils Relative: 3 %
HCT: 30.6 % — ABNORMAL LOW (ref 39.0–52.0)
Hemoglobin: 9.9 g/dL — ABNORMAL LOW (ref 13.0–17.0)
Immature Granulocytes: 0 %
Lymphocytes Relative: 12 %
Lymphs Abs: 0.8 10*3/uL (ref 0.7–4.0)
MCH: 28.5 pg (ref 26.0–34.0)
MCHC: 32.4 g/dL (ref 30.0–36.0)
MCV: 88.2 fL (ref 80.0–100.0)
Monocytes Absolute: 0.5 10*3/uL (ref 0.1–1.0)
Monocytes Relative: 7 %
Neutro Abs: 5.6 10*3/uL (ref 1.7–7.7)
Neutrophils Relative %: 77 %
Platelets: 231 10*3/uL (ref 150–400)
RBC: 3.47 MIL/uL — ABNORMAL LOW (ref 4.22–5.81)
RDW: 14.2 % (ref 11.5–15.5)
WBC: 7.3 10*3/uL (ref 4.0–10.5)
nRBC: 0 % (ref 0.0–0.2)

## 2023-03-25 MED ORDER — INSULIN ASPART 100 UNIT/ML IJ SOLN: 0.0000 [IU] | Freq: Every day | INTRAMUSCULAR | Status: DC

## 2023-03-25 MED ORDER — INSULIN ASPART 100 UNIT/ML IJ SOLN
0.0000 [IU] | Freq: Three times a day (TID) | INTRAMUSCULAR | Status: DC
  Administered 2023-03-26 – 2023-03-27 (×3): 2 [IU] via SUBCUTANEOUS

## 2023-03-25 NOTE — Progress Notes (Signed)
PT Cancellation Note  Patient Details Name: Christian [name] MRN: [id number] DOB: 1940/12/20   Cancelled Treatment:    Reason Eval/Treat Not Completed: Other (comment) - pt endorsing nausea when I checked on him midday, states he does not want to mobilize at that time. PT unable to go back to check on him today, mobility specialist to see.   Marye Round, PT DPT Acute Rehabilitation Services Secure Chat Preferred  Office [phone]     Truddie Coco 03/25/2023, 3:02 PM

## 2023-03-25 NOTE — Progress Notes (Signed)
Patient ID: Christian [name], male   DOB: Feb 24, 1941, 82 y.o.   MRN: [id number]    Progress Note from the Palliative Medicine Team at [facility]   Patient Name: Christian [name]        Date: 03/25/2023 DOB: 20-Aug-1941  Age: 82 y.o. MRN#: [id number] Attending Physician: Hughie Closs, MD Primary Care Physician: [name], [place] Admit Date: 03/22/2023   Medical records reviewed    Christian [name] is an 82 year old male presenting to [facility] found down after a fall.  He was recently hospitalized from 5/18 through 6/1 for acute on chronic CHF, A-fib, and acute pulmonary embolism.  He was readmitted within about 24 hours.  PMH significant for systolic CHF, CAD, HTN, and T2DM.   He was discharged from hospital with a Foley catheter in place, somehow tripped and traumatically removed it.   PMT worked with patient and family on last admission.   Patient lives alone with his 2 dogs.  His ex-wife and main caregiver lives across the street and checks on him very frequently.  Family continue to express concern for safety at home  Patient and family face treatment option decisions, advanced directive decisions and anticipatory care needs.  This NP assessed patient at the bedside as a follow up to  yesterday's GOCs meeting and to meet with main caregiver/Laurraine Rambert.  Education offered on patient's multiple comorbidities and his ongoing high risk for decompensation.  Again expressed concern for   Plan of Care: DNR/DNI -No artificial feeding or hydration now or in the future -Determine use of antibiotics if and when infection occurs -When medically stable, patient is open to short-term rehab if eligible.  Ultimately patient wishes to return home.    Today was able to secure healthcare power of attorney document/Tracy Perrapato is named H POA.  She is a cousin in Social worker.    Education offered today regarding  the importance of continued conversation with family and their  medical providers  regarding overall plan of care and treatment options,  ensuring decisions are within the context of the patients values and GOCs.  Questions and concerns addressed   Discussed with Dr Jacqulyn Bath   Time:  50 minutes  Detailed review of medical records ( labs, imaging, vital signs), medically appropriate exam ( MS, skin, resp)   discussed with treatment team, counseling and education to patient, family, staff, documenting clinical information, medication management, coordination of care    Christian Creed NP  Palliative Medicine Team Team Phone # [phone] Pager [phone]

## 2023-03-25 NOTE — Progress Notes (Signed)
PROGRESS NOTE    Christian [name]  VHQ:[id number] DOB: 1941-09-29 DOA: 03/22/2023 PCP: [name], PA   Brief Narrative:  Christian [name] is a 82 y.o. male with PMH significant for DM2, HTN, HLD, CAD, chronic systolic CHF, moderate pulmonary hypertension, GERD, dysphagia, cognitive impairment, anxiety/depression who lives alone at home, his wife lives next-door. Recently hospitalized 5/18 to 6/1 for worsening shortness of breath, diagnosed with bilateral PE, acute CHF exacerbation, required dobutamine drip by advanced heart failure service.  He was recommended supplement oxygen for ambulation which he refused.  At some point during the hospital, he also endorsed suicidal ideation and psychiatry was consulted but did not require inpatient psych hospitalization. He was recommended SNF by PT but insurance did not approve and hence he was discharged home 6/1.  6/2, within 24 hours of discharge, patient was brought to the ED by EMS after he was found on the floor with blood all over him.   Patient reported that his socks/shoes were slippery and he fell.  He tried to get up and was unable, his Foley got pulled out in the process and started bleeding.  His next-door family found him on the ground with blood everywhere.  Patient was incoherent.  EMS brought him to ED.   In the ED, patient was afebrile, heart rate in 70s, blood pressure in 90s, on 4 L oxygen Labs with unremarkable CBC, BMP with sodium 134, creatinine 23/1.47, lactic acid 2.1 ABG normal. CT head did not show any acute abnormality, showed chronic microvascular ischemic changes and remote right occipital infarct Chest x-ray showed bilateral increased interstitial markings suggestive of interstitial edema or multifocal interstitial pneumonia. X-ray pelvis did not show any evidence of fracture. Foley catheter was changed in the ED Admitted to [facility]  Assessment & Plan:   Principal Problem:   Fall at home, initial encounter Active  Problems:   Acute pulmonary embolism (HCC)   Paroxysmal atrial fibrillation (HCC)   Benign essential HTN   Type 2 diabetes mellitus with hyperlipidemia (HCC)   Hyperlipidemia   Coronary artery disease involving native coronary artery of native heart without angina pectoris   Acute urinary retention   MCI (mild cognitive impairment)   Chronic systolic congestive heart failure (HCC)  Fall Impaired mobility Patient has impaired mobility at baseline, recently seen by PT and was recommended SNF but insurance did not approve. Readmitted within 1 day after another fall at home.  Unclear circumstances.  Patient states he had slippery shocks.   He does not have 24 hour supervision and his ex-wife believes that he is unsafe at home and needs placement PT recommended SNF.  TOC consulted   Hematuria Secondary to traumatic Foley removal at home while on Eliquis. He required Foley catheter insertion during last hospitalization for acute urinary retention and was discharged with the same. Foley catheter was changed in the ED. hematuria was controlled initially but became severe.  Required stat urology consultation, Foley catheter was reinserted and he was started on empiric Bactrim and Flomax.  Hematuria is improving.  Urology recommended discharging with Foley catheter and follow-up in office for voiding trial in 1 week. Patient is on Eliquis since last hospitalization for PE.  He however has been refusing to take it.  It was taken off of the orders by previous hospitalist.   Chronic systolic CHF EF less than 20% at least since 2018 which had eventually improved but echo during recent hospitalization showed EF down to less than 20% again.  During recent hospitalization,  he was seen by advanced heart failure, required IV diuresis and dobutamine drip. PTA, he was on Lasix 40 mg daily, Aldactone 12.5 mg daily.  Volume status currently stable.  While in the ED, he was given IV Lasix.  Continued jump to 2.17,  improved to 1.98 today.  Continue to hold Lasix and Aldactone and reassess on daily basis for needs of resumption.   Afib Rate controlled with amiodarone Eliquis plan as above   Recent acute PE Diagnosed during last hospitalization Eliquis plan as above   Type 2 diabetes mellitus A1c 7.6 on January 2024.  Will recheck hemoglobin A1c. PTA not on meds.  Target A1c of 8 is appropriate for him Currently not on SSI either.  Will start on SSI.   CAD HLD Currently on Eliquis and Zetia   Cognitive impairment Noted during last hospitalization when he also had suicidal ideation and was seen by psychiatry. Continue delirium precautions Continue escitalopram  DVT prophylaxis: Eliquis   Code Status: DNR  Family Communication:  None present at bedside.  Palliative care on board and having communications with family.  Status is: Observation The patient will require care spanning > 2 midnights and should be moved to inpatient because: Still with hematuria and no safe discharge plan yet.   Estimated body mass index is 23.48 kg/m as calculated from the following:   Height as of 03/10/23: 5\' 6"  (1.676 m).   Weight as of 03/21/23: 66 kg.    Nutritional Assessment: There is no height or weight on file to calculate BMI.. Seen by dietician.  I agree with the assessment and plan as outlined below: Nutrition Status: Nutrition Problem: Inadequate oral intake Etiology: poor appetite Signs/Symptoms: meal completion < 50% Interventions: Ensure Enlive (each supplement provides 350kcal and 20 grams of protein), Liberalize Diet, MVI  . Skin Assessment: I have examined the patient's skin and I agree with the wound assessment as performed by the wound care RN as outlined below:    Consultants:  Urology-Signed off  Procedures:  As above  Antimicrobials:  Anti-infectives (From admission, onward)    Start     Dose/Rate Route Frequency Ordered Stop   03/24/23 2200  sulfamethoxazole-trimethoprim  (BACTRIM) 400-80 MG per tablet 1 tablet        1 tablet Oral Every 12 hours 03/24/23 2032 03/26/23 0959         Subjective: Patient seen and examined.  He has no complaints.  Although according to the notes from yesterday, he agreed to go to SNF but today he was saying that he was hoping to go home.  When I talked to him and reminded him that he agreed for SNF, he agreed once again today.  Objective: Vitals:   03/24/23 2050 03/25/23 0548 03/25/23 0548 03/25/23 0832  BP: 97/68 (!) 89/58 (!) 89/58 92/63  Pulse: 81 77 77 78  Resp: 16 16 16 14   Temp: 97.7 F (36.5 C) 97.8 F (36.6 C) 97.8 F (36.6 C) (!) 97.5 F (36.4 C)  TempSrc: Oral   Oral  SpO2: 100% 92% 93% 98%    Intake/Output Summary (Last 24 hours) at 03/25/2023 1012 Last data filed at 03/24/2023 2208 Gross per 24 hour  Intake 387 ml  Output 1350 ml  Net -963 ml   There were no vitals filed for this visit.  Examination:  General exam: Appears calm and comfortable  Respiratory system: Clear to auscultation. Respiratory effort normal. Cardiovascular system: S1 & S2 heard, RRR. No JVD, murmurs, rubs, gallops  or clicks. No pedal edema. Gastrointestinal system: Abdomen is nondistended, soft and nontender. No organomegaly or masses felt. Normal bowel sounds heard. Central nervous system: Alert and oriented. No focal neurological deficits. Extremities: Symmetric 5 x 5 power. Skin: No rashes, lesions or ulcers Psychiatry: Judgement and insight appear poor  Data Reviewed: I have personally reviewed following labs and imaging studies  CBC: Recent Labs  Lab 03/22/23 1026 03/22/23 1741 03/23/23 0819 03/25/23 0807  WBC 7.6  --  12.8* 7.3  NEUTROABS 5.9  --   --  5.6  HGB 12.5* 10.9* 12.0* 9.9*  HCT 40.2 32.0* 38.4* 30.6*  MCV 90.7  --  91.6 88.2  PLT 273  --  215 231   Basic Metabolic Panel: Recent Labs  Lab 03/19/23 0023 03/21/23 0018 03/22/23 1026 03/22/23 1741 03/23/23 0819 03/25/23 0807  NA 136 136 134*  136 135 133*  K 3.6 4.0 4.6 4.0 4.2 3.2*  CL 100 98 96*  --  97* 94*  CO2 26 25 24   --  22 28  GLUCOSE 125* 109* 117*  --  154* 114*  BUN 13 21 23   --  27* 34*  CREATININE 1.08 1.27* 1.47*  --  2.17* 1.98*  CALCIUM 8.0* 8.3* 9.0  --  8.2* 7.9*  PHOS 3.5  --   --   --   --   --    GFR: Estimated Creatinine Clearance: 26 mL/min (A) (by C-G formula based on SCr of 1.98 mg/dL (H)). Liver Function Tests: Recent Labs  Lab 03/19/23 0023 03/22/23 1026  AST  --  28  ALT  --  20  ALKPHOS  --  38  BILITOT  --  1.5*  PROT  --  6.8  ALBUMIN 2.8* 3.5   No results for input(s): "LIPASE", "AMYLASE" in the last 168 hours. No results for input(s): "AMMONIA" in the last 168 hours. Coagulation Profile: No results for input(s): "INR", "PROTIME" in the last 168 hours. Cardiac Enzymes: Recent Labs  Lab 03/22/23 1026  CKTOTAL 87   BNP (last 3 results) No results for input(s): "PROBNP" in the last 8760 hours. HbA1C: No results for input(s): "HGBA1C" in the last 72 hours. CBG: Recent Labs  Lab 03/22/23 1042 03/24/23 0809 03/24/23 1238 03/24/23 1621 03/24/23 2056  GLUCAP 107* 97 127* 140* 157*   Lipid Profile: No results for input(s): "CHOL", "HDL", "LDLCALC", "TRIG", "CHOLHDL", "LDLDIRECT" in the last 72 hours. Thyroid Function Tests: No results for input(s): "TSH", "T4TOTAL", "FREET4", "T3FREE", "THYROIDAB" in the last 72 hours. Anemia Panel: No results for input(s): "VITAMINB12", "FOLATE", "FERRITIN", "TIBC", "IRON", "RETICCTPCT" in the last 72 hours. Sepsis Labs: Recent Labs  Lab 03/22/23 1039 03/22/23 1231  LATICACIDVEN 2.1* 1.6    Recent Results (from the past 240 hour(s))  Culture, blood (single)     Status: None (Preliminary result)   Collection Time: 03/22/23 10:39 AM   Specimen: BLOOD LEFT ARM  Result Value Ref Range Status   Specimen Description BLOOD LEFT ARM  Final   Special Requests   Final    BOTTLES DRAWN AEROBIC ONLY Blood Culture adequate volume   Culture    Final    NO GROWTH 2 DAYS Performed at [facility], [address], [place], [place] [address]    Report Status PENDING  Incomplete     Radiology Studies: No results found.  Scheduled Meds:  amiodarone  200 mg Oral BID   Chlorhexidine Gluconate Cloth  6 each Topical Daily   docusate  sodium  100 mg Oral BID   escitalopram  20 mg Oral QHS   ezetimibe  10 mg Oral Daily   feeding supplement  237 mL Oral BID BM   multivitamin with minerals  1 tablet Oral Daily   pantoprazole  40 mg Oral Daily   rosuvastatin  40 mg Oral Daily   sulfamethoxazole-trimethoprim  1 tablet Oral Q12H   tamsulosin  0.4 mg Oral QPC supper   Continuous Infusions:   LOS: 0 days   Hughie Closs, MD [facility]  03/25/2023, 10:12 AM   *Please note that this is a verbal dictation therefore any spelling or grammatical errors are due to the "Dragon Medical One" system interpretation.  Please page via Amion and do not message via secure chat for urgent patient care matters. Secure chat can be used for non urgent patient care matters.  How to contact the [facility] Attending or Consulting provider 7A - 7P or covering provider during after hours 7P -7A, for this patient?  Check the care team in [facility] and look for a) attending/consulting TRH provider listed and b) the [facility] team listed. Page or secure chat 7A-7P. Log into [email or web address] and use [facility] universal password to access. If you do not have the password, please contact the hospital operator. Locate the [facility] provider you are looking for under [facility] and page to a number that you can be directly reached. If you still have difficulty reaching the provider, please page the [facility] (Director on Call) for the Hospitalists listed on amion for assistance.

## 2023-03-25 NOTE — Progress Notes (Signed)
Pharmacist Heart Failure Core Measure Documentation  Assessment: Christian [name] has an EF documented as <20% on 03/07/23 by ECHO.  Rationale: Heart failure patients with left ventricular systolic dysfunction (LVSD) and an EF < 40% should be prescribed an angiotensin converting enzyme inhibitor (ACEI) or angiotensin receptor blocker (ARB) at discharge unless a contraindication is documented in the medical record.  This patient is not currently on an ACEI or ARB for HF.  This note is being placed in the record in order to provide documentation that a contraindication to the use of these agents is present for this encounter.  ACE Inhibitor or Angiotensin Receptor Blocker is contraindicated (specify all that apply)  []   ACEI allergy AND ARB allergy []   Angioedema []   Moderate or severe aortic stenosis []   Hyperkalemia []   Hypotension []   Renal artery stenosis [x]   Worsening renal function, preexisting renal disease or dysfunction

## 2023-03-25 NOTE — Progress Notes (Signed)
Mobility Specialist Progress Note   03/25/23 1747  Mobility  Activity Refused mobility   Pt deferring mobility in the evening expressing cramps in lower abdomen. Will continue to follow up in the upcoming days. RN notified.  Frederico Hamman Mobility Specialist Please contact via SecureChat or  Rehab office at [phone]

## 2023-03-26 ENCOUNTER — Telehealth: Payer: Self-pay | Admitting: Physician Assistant

## 2023-03-26 DIAGNOSIS — R338 Other retention of urine: Secondary | ICD-10-CM | POA: Diagnosis not present

## 2023-03-26 DIAGNOSIS — Y92009 Unspecified place in unspecified non-institutional (private) residence as the place of occurrence of the external cause: Secondary | ICD-10-CM | POA: Diagnosis not present

## 2023-03-26 DIAGNOSIS — W19XXXA Unspecified fall, initial encounter: Secondary | ICD-10-CM | POA: Diagnosis not present

## 2023-03-26 LAB — BASIC METABOLIC PANEL
Anion gap: 10 (ref 5–15)
BUN: 24 mg/dL — ABNORMAL HIGH (ref 8–23)
CO2: 28 mmol/L (ref 22–32)
Calcium: 7.8 mg/dL — ABNORMAL LOW (ref 8.9–10.3)
Chloride: 96 mmol/L — ABNORMAL LOW (ref 98–111)
Creatinine, Ser: 1.54 mg/dL — ABNORMAL HIGH (ref 0.61–1.24)
GFR, Estimated: 45 mL/min — ABNORMAL LOW (ref >60)
Glucose, Bld: 112 mg/dL — ABNORMAL HIGH (ref 70–99)
Potassium: 3.1 mmol/L — ABNORMAL LOW (ref 3.5–5.1)
Sodium: 134 mmol/L — ABNORMAL LOW (ref 135–145)

## 2023-03-26 LAB — CBC WITH DIFFERENTIAL/PLATELET
Abs Immature Granulocytes: 0.03 10*3/uL (ref 0.00–0.07)
Basophils Absolute: 0 10*3/uL (ref 0.0–0.1)
Basophils Relative: 1 %
Eosinophils Absolute: 0.3 10*3/uL (ref 0.0–0.5)
Eosinophils Relative: 5 %
HCT: 32.3 % — ABNORMAL LOW (ref 39.0–52.0)
Hemoglobin: 10.2 g/dL — ABNORMAL LOW (ref 13.0–17.0)
Immature Granulocytes: 1 %
Lymphocytes Relative: 17 %
Lymphs Abs: 1.1 10*3/uL (ref 0.7–4.0)
MCH: 28.1 pg (ref 26.0–34.0)
MCHC: 31.6 g/dL (ref 30.0–36.0)
MCV: 89 fL (ref 80.0–100.0)
Monocytes Absolute: 0.6 10*3/uL (ref 0.1–1.0)
Monocytes Relative: 10 %
Neutro Abs: 4.1 10*3/uL (ref 1.7–7.7)
Neutrophils Relative %: 66 %
Platelets: 252 10*3/uL (ref 150–400)
RBC: 3.63 MIL/uL — ABNORMAL LOW (ref 4.22–5.81)
RDW: 14.2 % (ref 11.5–15.5)
WBC: 6.2 10*3/uL (ref 4.0–10.5)
nRBC: 0 % (ref 0.0–0.2)

## 2023-03-26 LAB — GLUCOSE, CAPILLARY
Glucose-Capillary: 121 mg/dL — ABNORMAL HIGH (ref 70–99)
Glucose-Capillary: 134 mg/dL — ABNORMAL HIGH (ref 70–99)
Glucose-Capillary: 116 mg/dL — ABNORMAL HIGH (ref 70–99)

## 2023-03-26 LAB — CULTURE, BLOOD (SINGLE): Culture: NO GROWTH

## 2023-03-26 LAB — LACTIC ACID, PLASMA: Lactic Acid, Venous: 1.2 mmol/L (ref 0.5–1.9)

## 2023-03-26 MED ORDER — POTASSIUM CHLORIDE CRYS ER 20 MEQ PO TBCR
40.0000 meq | EXTENDED_RELEASE_TABLET | ORAL | Status: AC
  Administered 2023-03-26 (×2): 40 meq via ORAL
  Filled 2023-03-26 (×2): qty 2

## 2023-03-26 MED ORDER — MIDODRINE HCL 5 MG PO TABS
5.0000 mg | ORAL_TABLET | Freq: Three times a day (TID) | ORAL | Status: DC
  Administered 2023-03-26 – 2023-03-27 (×3): 5 mg via ORAL
  Filled 2023-03-26 (×4): qty 1

## 2023-03-26 NOTE — NC FL2 (Signed)
[place] MEDICAID FL2 LEVEL OF CARE FORM     IDENTIFICATION  Patient Name: Christian [name] Birthdate: 08-Feb-1941 Sex: male Admission Date (Current Location): 03/22/2023  [facility] and [place] Number:  Producer, television/film/video and Address:  [facility], [address], [place], [place] [address]      Provider Number: [id number]  Attending Physician Name and Address:  Hughie Closs, MD  Relative Name and Phone Number:       Current Level of Care:   Recommended Level of Care: Skilled Nursing Facility Prior Approval Number:    Date Approved/Denied:   PASRR Number: [id number]  Discharge Plan: SNF    Current Diagnoses: Patient Active Problem List   Diagnosis Date Noted   Failure to thrive in adult 03/25/2023   Fall at home, initial encounter 03/22/2023   AKI (acute kidney injury) (HCC) 03/21/2023   Acute urinary retention 03/10/2023   Liver lesion 03/09/2023   Acute on chronic systolic CHF (congestive heart failure) (HCC) 03/07/2023   Cerebrovascular disease 03/07/2023   Acute pulmonary embolism (HCC) 03/07/2023   Paroxysmal atrial fibrillation (HCC) 03/07/2023   Mood disorder (HCC) 03/07/2023   Nausea without vomiting 04/29/2022   GAD (generalized anxiety disorder) 04/29/2022   Rash and nonspecific skin eruption 04/29/2022   Allergic conjunctivitis of both eyes    Stage 2 chronic kidney disease    Benign essential HTN    Chronic systolic congestive heart failure (HCC)    Acute ischemic right posterior cerebral artery (PCA) stroke (HCC) 03/01/2020   Allergic rhinitis due to pollen 03/29/2018   Oropharyngeal dysphagia 01/11/2018   GERD with esophagitis 01/11/2018   Claudication of both lower extremities (HCC) 01/06/2018   Nonischemic cardiomyopathy (HCC) 11/11/2017   Coronary artery disease involving native coronary artery of native heart without angina pectoris 11/11/2017   MCI (mild cognitive impairment) 07/08/2017   Type 2 diabetes mellitus with  hyperlipidemia (HCC) 07/08/2017   Hyperlipidemia 05/25/2017    Orientation RESPIRATION BLADDER Height & Weight     Self, Situation, Place (fluctuating orientation)  Normal Continent Weight:   Height:     BEHAVIORAL SYMPTOMS/MOOD NEUROLOGICAL BOWEL NUTRITION STATUS      Incontinent    AMBULATORY STATUS COMMUNICATION OF NEEDS Skin   Limited Assist   Normal                       Personal Care Assistance Level of Assistance  Bathing, Feeding, Dressing Bathing Assistance: Limited assistance Feeding assistance: Limited assistance Dressing Assistance: Limited assistance     Functional Limitations Info  Sight, Hearing, Speech Sight Info: Adequate Hearing Info: Adequate Speech Info: Adequate    SPECIAL CARE FACTORS FREQUENCY  PT (By licensed PT), OT (By licensed OT)                    Contractures Contractures Info: Not present    Additional Factors Info  Code Status Code Status Info: DNR             Current Medications (03/26/2023):  This is the current hospital active medication list Current Facility-Administered Medications  Medication Dose Route Frequency Provider Last Rate Last Admin   acetaminophen (TYLENOL) tablet 650 mg  650 mg Oral Q6H PRN Jonah Blue, MD   650 mg at 03/24/23 1122   Or   acetaminophen (TYLENOL) suppository 650 mg  650 mg Rectal Q6H PRN Jonah Blue, MD       albuterol (PROVENTIL) (2.5 MG/3ML) 0.083% nebulizer  solution 2.5 mg  2.5 mg Nebulization Q6H PRN Jonah Blue, MD       amiodarone (PACERONE) tablet 200 mg  200 mg Oral BID Jonah Blue, MD   200 mg at 03/25/23 4098   bisacodyl (DULCOLAX) EC tablet 5 mg  5 mg Oral Daily PRN Jonah Blue, MD       Chlorhexidine Gluconate Cloth 2 % PADS 6 each  6 each Topical Daily Dahal, Melina Schools, MD   6 each at 03/25/23 0956   docusate sodium (COLACE) capsule 100 mg  100 mg Oral BID Jonah Blue, MD   100 mg at 03/25/23 2307   escitalopram (LEXAPRO) tablet 20 mg  20 mg Oral Noemi Chapel, MD   20 mg at 03/25/23 2306   ezetimibe (ZETIA) tablet 10 mg  10 mg Oral Daily Jonah Blue, MD   10 mg at 03/25/23 0955   feeding supplement (ENSURE ENLIVE / ENSURE PLUS) liquid 237 mL  237 mL Oral BID BM Dahal, Melina Schools, MD   237 mL at 03/25/23 1419   hydrALAZINE (APRESOLINE) injection 5 mg  5 mg Intravenous Q4H PRN Jonah Blue, MD       insulin aspart (novoLOG) injection 0-15 Units  0-15 Units Subcutaneous TID WC Pahwani, Ravi, MD       insulin aspart (novoLOG) injection 0-5 Units  0-5 Units Subcutaneous QHS Pahwani, Daleen Bo, MD       multivitamin with minerals tablet 1 tablet  1 tablet Oral Daily Dahal, Binaya, MD   1 tablet at 03/25/23 0955   ondansetron (ZOFRAN) tablet 4 mg  4 mg Oral Q6H PRN Jonah Blue, MD   4 mg at 03/25/23 2306   Or   ondansetron (ZOFRAN) injection 4 mg  4 mg Intravenous Q6H PRN Jonah Blue, MD   4 mg at 03/25/23 0956   pantoprazole (PROTONIX) EC tablet 40 mg  40 mg Oral Daily Jonah Blue, MD   40 mg at 03/25/23 0956   polyethylene glycol (MIRALAX / GLYCOLAX) packet 17 g  17 g Oral Daily PRN Jonah Blue, MD       potassium chloride SA (KLOR-CON M) CR tablet 40 mEq  40 mEq Oral Q4H Pahwani, Ravi, MD       rosuvastatin (CRESTOR) tablet 40 mg  40 mg Oral Daily Jonah Blue, MD   40 mg at 03/25/23 0955   tamsulosin (FLOMAX) capsule 0.4 mg  0.4 mg Oral QPC supper Jonah Blue, MD   0.4 mg at 03/25/23 1746     Discharge Medications: Please see discharge summary for a list of discharge medications.  Relevant Imaging Results:  Relevant Lab Results:   Additional Information SSN: [id number]  Deatra Robinson, [place]

## 2023-03-26 NOTE — Telephone Encounter (Signed)
Caller states she was calling for update on order she faxed on 5/2. States she faxed this order multiple times. Order number is [id number]. I informed caller that this wasn't received and advised her to re-fax this to which she agreed.

## 2023-03-26 NOTE — Progress Notes (Signed)
Physical Therapy Treatment Patient Details Name: Christian [name] MRN: [id number] DOB: [birth year] Today's Date: 03/26/2023   History of Present Illness Christian [name] is a 82 y.o. male who presents with AMS and a fall at home. Last admitted at [facility] from 5/18-6/1 for bilateral segmental PE.  PMHx: R PCA CVA, Allergic rhinitis, HFrEF, CAD, HTN, GERD, Hernia of abdominal cavity, HLD, neuropathy involving both lower extremities, and DM II.    PT Comments    Pt greeted supine in bed, sleeping but easily roused and agreeable to session. Pt pleasant and participatory throughout session with continued progress towards acute goals. Pt requiring grossly min A for bed mobility, functional transfers and gait with RW for support. Pt requiring cues throughout mobility for safe hand placement on rise to standing and cues throughout to recall task at hand. Pt with improved insight into deficits this session, stating he feels more weak than baseline and agreeable with current recommendation of post acute short term rehab. Current plan continues to remain appropriate to address deficits and maximize functional independence and decrease caregiver burden, will continue to follow acutely.    Recommendations for follow up therapy are one component of a multi-disciplinary discharge planning process, led by the attending physician.  Recommendations may be updated based on patient status, additional functional criteria and insurance authorization.  Follow Up Recommendations  Can patient physically be transported by private vehicle: Yes    Assistance Recommended at Discharge Frequent or constant Supervision/Assistance  Patient can return home with the following A little help with walking and/or transfers;A little help with bathing/dressing/bathroom;Assistance with cooking/housework;Direct supervision/assist for medications management;Direct supervision/assist for financial management;Assist for transportation;Help with stairs or  ramp for entrance   Equipment Recommendations  None recommended by PT    Recommendations for Other Services       Precautions / Restrictions Precautions Precautions: Fall Restrictions Weight Bearing Restrictions: No     Mobility  Bed Mobility Overal bed mobility: Needs Assistance Bed Mobility: Supine to Sit     Supine to sit: Min assist, HOB elevated     General bed mobility comments: min A HHA to raise trunk, increased time and cues to scoot out to EOB    Transfers Overall transfer level: Needs assistance Equipment used: Rolling walker (2 wheels) Transfers: Sit to/from Stand Sit to Stand: Min assist           General transfer comment: assist for initial rise and steady with cues for hand placement, x2 from EOB and chair    Ambulation/Gait Ambulation/Gait assistance: Min assist Gait Distance (Feet): 24 Feet (x2) Assistive device: Rolling walker (2 wheels) Gait Pattern/deviations: Step-through pattern, Decreased stride length, Trunk flexed Gait velocity: decr     General Gait Details: assist to steady, pt with quick shuffling gait with knees flexed during stance phases, pt needing repeated cues for destination   Stairs             Wheelchair Mobility    Modified Rankin (Stroke Patients Only)       Balance Overall balance assessment: Needs assistance Sitting-balance support: Feet supported, Single extremity supported Sitting balance-Leahy Scale: Fair Sitting balance - Comments: sitting EOB   Standing balance support: Bilateral upper extremity supported, Reliant on assistive device for balance Standing balance-Leahy Scale: Poor Standing balance comment: BUE support on RW for stability                            Cognition Arousal/Alertness: Awake/alert Behavior During  Therapy: WFL for tasks assessed/performed Overall Cognitive Status: History of cognitive impairments - at baseline Area of Impairment: Orientation, Attention,  Memory, Following commands, Safety/judgement, Awareness, Problem solving                 Orientation Level: Disoriented to, Time (pt reoriented to time of day at start of session, but continuing to make comments about it being "late") Current Attention Level: Sustained Memory: Decreased recall of precautions, Decreased short-term memory Following Commands: Follows one step commands with increased time Safety/Judgement: Decreased awareness of safety, Decreased awareness of deficits Awareness: Intellectual Problem Solving: Slow processing, Requires verbal cues, Difficulty sequencing General Comments: pt pleasant throughout session, needs repetition for cues due to [facility], pt more agreeable to SNF placement as pt stating he is not as strong as he was and understands he needs rehab before returning home        Exercises General Exercises - Lower Extremity Long Arc Quad: AROM, Both, 20 reps, Seated, Strengthening (with graded manual resistance) Hip Flexion/Marching: AROM, Both, 20 reps, Strengthening, Seated    General Comments General comments (skin integrity, edema, etc.): VSS on RA      Pertinent Vitals/Pain Pain Assessment Pain Assessment: No/denies pain Pain Intervention(s): Monitored during session    Home Living                          Prior Function            PT Goals (current goals can now be found in the care plan section) Acute Rehab PT Goals Patient Stated Goal: improve endurance and get stronger PT Goal Formulation: With patient Time For Goal Achievement: 04/06/23 Progress towards PT goals: Progressing toward goals    Frequency    Min 3X/week      PT Plan Current plan remains appropriate    Co-evaluation              AM-PAC PT "6 Clicks" Mobility   Outcome Measure  Help needed turning from your back to your side while in a flat bed without using bedrails?: A Little Help needed moving from lying on your back to sitting on the side  of a flat bed without using bedrails?: A Little Help needed moving to and from a bed to a chair (including a wheelchair)?: A Little Help needed standing up from a chair using your arms (e.g., wheelchair or bedside chair)?: A Little Help needed to walk in hospital room?: A Little Help needed climbing 3-5 steps with a railing? : A Lot 6 Click Score: 17    End of Session Equipment Utilized During Treatment: Gait belt Activity Tolerance: Patient tolerated treatment well Patient left: in chair;with call bell/phone within reach;with chair alarm set Nurse Communication: Mobility status PT Visit Diagnosis: Other abnormalities of gait and mobility (R26.89)     Time: 1610-9604 PT Time Calculation (min) (ACUTE ONLY): 16 min  Charges:  $Gait Training: 8-22 mins                     Lenora Boys. PTA Acute Rehabilitation Services Office: [phone]   Catalina Antigua 03/26/2023, 9:45 AM

## 2023-03-26 NOTE — TOC Progression Note (Signed)
Transition of Care [facility]) - Progression Note    Patient Details  Name: Christian [name] MRN: [id number] Date of Birth: 11/06/1940  [facility]) CM/SW Contact  Dellie Burns [place], [place] Phone Number: 03/26/2023, 10:36 AM  Clinical Narrative:  Spoke to pt's primary contact Christian [name] [phone] re SNF placement and current bed offers. Christian [name] confirmed agreeable to SNF placement and accepted [facility]. Notified Christian [name] in admissions and requested CMA begin insurance auth. PASRR received and added to FL2. SW will provide updates as available.    Dellie Burns, MSW, LCSW [phone] (coverage)     Expected Discharge Plan: Skilled Nursing Facility Barriers to Discharge: Insurance Authorization  Expected Discharge Plan and Services       Living arrangements for the past 2 months: Single Family Home                                       Social Determinants of Health (SDOH) Interventions SDOH Screenings   Food Insecurity: No Food Insecurity (03/26/2023)  Housing: Low Risk  (03/26/2023)  Transportation Needs: No Transportation Needs (03/26/2023)  Utilities: Not At Risk (03/26/2023)  Alcohol Screen: Low Risk  (10/12/2019)  Depression (PHQ2-9): Medium Risk (04/29/2022)  Financial Resource Strain: Low Risk  (03/10/2023)  Tobacco Use: Low Risk  (03/22/2023)    Readmission Risk Interventions     No data to display

## 2023-03-26 NOTE — Progress Notes (Addendum)
PROGRESS NOTE    Christian [name]  ZOX:[id number] DOB: 1941/01/24 DOA: 03/22/2023 PCP: [name], PA   Brief Narrative:  Christian [name] is a 82 y.o. male with PMH significant for DM2, HTN, HLD, CAD, chronic systolic CHF, moderate pulmonary hypertension, GERD, dysphagia, cognitive impairment, anxiety/depression who lives alone at home, his wife lives next-door. Recently hospitalized 5/18 to 6/1 for worsening shortness of breath, diagnosed with bilateral PE, acute CHF exacerbation, required dobutamine drip by advanced heart failure service.  He was recommended supplement oxygen for ambulation which he refused.  At some point during the hospital, he also endorsed suicidal ideation and psychiatry was consulted but did not require inpatient psych hospitalization. He was recommended SNF by PT but insurance did not approve and hence he was discharged home 6/1.  6/2, within 24 hours of discharge, patient was brought to the ED by EMS after he was found on the floor with blood all over him.   Patient reported that his socks/shoes were slippery and he fell.  He tried to get up and was unable, his Foley got pulled out in the process and started bleeding.  His next-door family found him on the ground with blood everywhere.  Patient was incoherent.  EMS brought him to ED.   In the ED, patient was afebrile, heart rate in 70s, blood pressure in 90s, on 4 L oxygen Labs with unremarkable CBC, BMP with sodium 134, creatinine 23/1.47, lactic acid 2.1 ABG normal. CT head did not show any acute abnormality, showed chronic microvascular ischemic changes and remote right occipital infarct Chest x-ray showed bilateral increased interstitial markings suggestive of interstitial edema or multifocal interstitial pneumonia. X-ray pelvis did not show any evidence of fracture. Foley catheter was changed in the ED Admitted to [facility]  Assessment & Plan:   Principal Problem:   Fall at home, initial encounter Active  Problems:   Acute pulmonary embolism (HCC)   Paroxysmal atrial fibrillation (HCC)   Benign essential HTN   Type 2 diabetes mellitus with hyperlipidemia (HCC)   Hyperlipidemia   Coronary artery disease involving native coronary artery of native heart without angina pectoris   Acute urinary retention   MCI (mild cognitive impairment)   Chronic systolic congestive heart failure (HCC)   Failure to thrive in adult  Fall Impaired mobility/generalized weakness Patient has impaired mobility at baseline, recently seen by PT and was recommended SNF but insurance did not approve. Readmitted within 1 day after another fall at home.  Unclear circumstances.  Patient states he had slippery shocks.   He does not have 24 hour supervision and his ex-wife believes that he is unsafe at home and needs placement PT recommended SNF.  TOC consulted, waiting for placement.   Hematuria Secondary to traumatic Foley removal at home while on Eliquis. He required Foley catheter insertion during last hospitalization for acute urinary retention and was discharged with the same. Foley catheter was changed in the ED. hematuria was controlled initially but became severe.  Required stat urology consultation, Foley catheter was reinserted and he was started on empiric Bactrim and Flomax.  Hematuria is improving.  Urology recommended discharging with Foley catheter and follow-up in office for voiding trial in 1 week. Patient is on Eliquis since last hospitalization for PE.  He however has been refusing to take it.  It was taken off of the orders by previous hospitalist.   Chronic systolic CHF EF less than 20% at least since 2018 which had eventually improved but echo during recent hospitalization showed  EF down to less than 20% again.  During recent hospitalization, he was seen by advanced heart failure, required IV diuresis and dobutamine drip. PTA, he was on Lasix 40 mg daily, Aldactone 12.5 mg daily.  Volume status  currently stable.  While in the ED, he was given IV Lasix.  Continued jump to 2.17, improved to 1.98 today.  Continue to hold Lasix and Aldactone and reassess on daily basis for needs of resumption.   Afib Rate controlled with amiodarone Eliquis plan as above   Recent acute PE Diagnosed during last hospitalization Eliquis plan as above   Type 2 diabetes mellitus A1c 7.6 on January 2024.  Will recheck hemoglobin A1c. PTA not on meds.  Target A1c of 8 is appropriate for him Any SSI.  Blood sugar controlled.   CAD HLD Currently on Eliquis and Zetia  Hypotension: Lactic acid normal.  Will start on midodrine 5 mg 3 times daily.   Cognitive impairment Noted during last hospitalization when he also had suicidal ideation and was seen by psychiatry. Continue delirium precautions Continue escitalopram  Hypokalemia: Replace.  DVT prophylaxis: Eliquis   Code Status: DNR  Family Communication:  None present at bedside.  Palliative care on board and having communications with family.  Status is: Inpatient Remains inpatient appropriate because: Medically stable, pending placement to SNF.     Estimated body mass index is 23.48 kg/m as calculated from the following:   Height as of 03/10/23: 5\' 6"  (1.676 m).   Weight as of 03/21/23: 66 kg.    Nutritional Assessment: There is no height or weight on file to calculate BMI.. Seen by dietician.  I agree with the assessment and plan as outlined below: Nutrition Status: Nutrition Problem: Inadequate oral intake Etiology: poor appetite Signs/Symptoms: meal completion < 50% Interventions: Ensure Enlive (each supplement provides 350kcal and 20 grams of protein), Liberalize Diet, MVI  . Skin Assessment: I have examined the patient's skin and I agree with the wound assessment as performed by the wound care RN as outlined below:    Consultants:  Urology-Signed off  Procedures:  As above  Antimicrobials:  Anti-infectives (From  admission, onward)    Start     Dose/Rate Route Frequency Ordered Stop   03/24/23 2200  sulfamethoxazole-trimethoprim (BACTRIM) 400-80 MG per tablet 1 tablet        1 tablet Oral Every 12 hours 03/24/23 2032 03/25/23 2306         Subjective: Seen and examined.  He has no complaints.  Objective: Vitals:   03/25/23 2034 03/25/23 2038 03/26/23 0704 03/26/23 0841  BP: (!) 86/60 92/62 96/70  95/71  Pulse: 83  85 93  Resp:    16  Temp:    (!) 97.4 F (36.3 C)  TempSrc:    Oral  SpO2: 97%  96% 96%    Intake/Output Summary (Last 24 hours) at 03/26/2023 1519 Last data filed at 03/26/2023 0600 Gross per 24 hour  Intake 480 ml  Output 1050 ml  Net -570 ml    There were no vitals filed for this visit.  Examination:  General exam: Appears calm and comfortable  Respiratory system: Clear to auscultation. Respiratory effort normal. Cardiovascular system: S1 & S2 heard, RRR. No JVD, murmurs, rubs, gallops or clicks. No pedal edema. Gastrointestinal system: Abdomen is nondistended, soft and nontender. No organomegaly or masses felt. Normal bowel sounds heard. Central nervous system: Alert and oriented. No focal neurological deficits. Extremities: Symmetric 5 x 5 power. Skin: No rashes, lesions or  ulcers.  Psychiatry: Judgement and insight appear poor  Data Reviewed: I have personally reviewed following labs and imaging studies  CBC: Recent Labs  Lab 03/22/23 1026 03/22/23 1741 03/23/23 0819 03/25/23 0807 03/26/23 0753  WBC 7.6  --  12.8* 7.3 6.2  NEUTROABS 5.9  --   --  5.6 4.1  HGB 12.5* 10.9* 12.0* 9.9* 10.2*  HCT 40.2 32.0* 38.4* 30.6* 32.3*  MCV 90.7  --  91.6 88.2 89.0  PLT 273  --  215 231 252    Basic Metabolic Panel: Recent Labs  Lab 03/21/23 0018 03/22/23 1026 03/22/23 1741 03/23/23 0819 03/25/23 0807 03/26/23 0753  NA 136 134* 136 135 133* 134*  K 4.0 4.6 4.0 4.2 3.2* 3.1*  CL 98 96*  --  97* 94* 96*  CO2 25 24  --  22 28 28   GLUCOSE 109* 117*  --  154*  114* 112*  BUN 21 23  --  27* 34* 24*  CREATININE 1.27* 1.47*  --  2.17* 1.98* 1.54*  CALCIUM 8.3* 9.0  --  8.2* 7.9* 7.8*    GFR: Estimated Creatinine Clearance: 33.4 mL/min (A) (by C-G formula based on SCr of 1.54 mg/dL (H)). Liver Function Tests: Recent Labs  Lab 03/22/23 1026  AST 28  ALT 20  ALKPHOS 38  BILITOT 1.5*  PROT 6.8  ALBUMIN 3.5    No results for input(s): "LIPASE", "AMYLASE" in the last 168 hours. No results for input(s): "AMMONIA" in the last 168 hours. Coagulation Profile: No results for input(s): "INR", "PROTIME" in the last 168 hours. Cardiac Enzymes: Recent Labs  Lab 03/22/23 1026  CKTOTAL 87    BNP (last 3 results) No results for input(s): "PROBNP" in the last 8760 hours. HbA1C: Recent Labs    03/25/23 1232  HGBA1C 7.0*   CBG: Recent Labs  Lab 03/24/23 2056 03/25/23 1245 03/25/23 1721 03/25/23 2045 03/26/23 1219  GLUCAP 157* 118* 115* 154* 121*    Lipid Profile: No results for input(s): "CHOL", "HDL", "LDLCALC", "TRIG", "CHOLHDL", "LDLDIRECT" in the last 72 hours. Thyroid Function Tests: No results for input(s): "TSH", "T4TOTAL", "FREET4", "T3FREE", "THYROIDAB" in the last 72 hours. Anemia Panel: No results for input(s): "VITAMINB12", "FOLATE", "FERRITIN", "TIBC", "IRON", "RETICCTPCT" in the last 72 hours. Sepsis Labs: Recent Labs  Lab 03/22/23 1039 03/22/23 1231 03/26/23 1133  LATICACIDVEN 2.1* 1.6 1.2     Recent Results (from the past 240 hour(s))  Culture, blood (single)     Status: None (Preliminary result)   Collection Time: 03/22/23 10:39 AM   Specimen: BLOOD LEFT ARM  Result Value Ref Range Status   Specimen Description BLOOD LEFT ARM  Final   Special Requests   Final    BOTTLES DRAWN AEROBIC ONLY Blood Culture adequate volume   Culture   Final    NO GROWTH 4 DAYS Performed at [facility], [address] [place], [place] [address]    Report Status PENDING  Incomplete     Radiology Studies: No  results found.  Scheduled Meds:  amiodarone  200 mg Oral BID   Chlorhexidine Gluconate Cloth  6 each Topical Daily   docusate sodium  100 mg Oral BID   escitalopram  20 mg Oral QHS   ezetimibe  10 mg Oral Daily   feeding supplement  237 mL Oral BID BM   insulin aspart  0-15 Units Subcutaneous TID WC   insulin aspart  0-5 Units Subcutaneous QHS   midodrine  5 mg Oral TID  WC   multivitamin with minerals  1 tablet Oral Daily   pantoprazole  40 mg Oral Daily   rosuvastatin  40 mg Oral Daily   tamsulosin  0.4 mg Oral QPC supper   Continuous Infusions:   LOS: 1 day   Hughie Closs, MD [facility]  03/26/2023, 3:19 PM   *Please note that this is a verbal dictation therefore any spelling or grammatical errors are due to the "Dragon [employer] One" system interpretation.  Please page via Amion and do not message via secure chat for urgent patient care matters. Secure chat can be used for non urgent patient care matters.  How to contact the [facility] Attending or Consulting provider 7A - 7P or covering provider during after hours 7P -7A, for this patient?  Check the care team in [facility] and look for a) attending/consulting TRH provider listed and b) the [facility] team listed. Page or secure chat 7A-7P. Log into [email or web address] and use [facility]'s universal password to access. If you do not have the password, please contact the hospital operator. Locate the [facility] provider you are looking for under [facility] and page to a number that you can be directly reached. If you still have difficulty reaching the provider, please page the [facility] (Director on Call) for the Hospitalists listed on amion for assistance.

## 2023-03-27 DIAGNOSIS — R338 Other retention of urine: Secondary | ICD-10-CM | POA: Diagnosis not present

## 2023-03-27 DIAGNOSIS — Y92009 Unspecified place in unspecified non-institutional (private) residence as the place of occurrence of the external cause: Secondary | ICD-10-CM | POA: Diagnosis not present

## 2023-03-27 DIAGNOSIS — W19XXXA Unspecified fall, initial encounter: Secondary | ICD-10-CM | POA: Diagnosis not present

## 2023-03-27 LAB — GLUCOSE, CAPILLARY
Glucose-Capillary: 125 mg/dL — ABNORMAL HIGH (ref 70–99)
Glucose-Capillary: 133 mg/dL — ABNORMAL HIGH (ref 70–99)

## 2023-03-27 MED ORDER — POTASSIUM CHLORIDE CRYS ER 20 MEQ PO TBCR: 40.0000 meq | EXTENDED_RELEASE_TABLET | Freq: Once | ORAL | Status: DC

## 2023-03-27 MED ORDER — MIDODRINE HCL 5 MG PO TABS: 5.0000 mg | ORAL_TABLET | Freq: Three times a day (TID) | ORAL | 0 refills | Status: AC

## 2023-03-27 NOTE — Discharge Summary (Signed)
Physician Discharge Summary  Christian [name] OZH:[id number] DOB: Aug 13, 1941 DOA: 03/22/2023  PCP: [name], PA  Admit date: 03/22/2023 Discharge date: 03/27/2023 30 Day Unplanned Readmission Risk Score    Flowsheet Row ED to Hosp-Admission (Current) from 03/22/2023 in [facility]  30 Day Unplanned Readmission Risk Score (%) 23.17 Filed at 03/27/2023 0801       This score is the patient's risk of an unplanned readmission within 30 days of being discharged (0 -100%). The score is based on dignosis, age, lab data, medications, orders, and past utilization.   Low:  0-14.9   Medium: 15-21.9   High: 22-29.9   Extreme: 30 and above          Admitted From: Home Disposition: SNF  Recommendations for Outpatient Follow-up:  Follow up with PCP in 1-2 weeks Please obtain BMP/CBC in one week Please follow up with your PCP on the following pending results: Unresulted Labs (From admission, onward)     Start     Ordered   03/22/23 1027  Urinalysis, Routine w reflex microscopic -Urine, Clean Catch  Once,   URGENT       Question:  Specimen Source  Answer:  Urine, Clean Catch   03/22/23 1026              Home Health: None Equipment/Devices: None  Discharge Condition: Stable CODE STATUS: DNR Diet recommendation: Cardiac/low sodium  Subjective: Seen and went.  No complaints.  Brief/Interim Summary: Calen Geister is a 82 y.o. male with PMH significant for DM2, HTN, HLD, CAD, chronic systolic CHF, moderate pulmonary hypertension, GERD, dysphagia, cognitive impairment, anxiety/depression who lives alone at home, his wife lives next-door. Recently hospitalized 5/18 to 6/1 for worsening shortness of breath, diagnosed with bilateral PE, acute CHF exacerbation, required dobutamine drip by advanced heart failure service.  He was recommended supplement oxygen for ambulation which he refused.  At some point during the hospital, he also endorsed suicidal ideation and psychiatry was  consulted but did not require inpatient psych hospitalization. He was recommended SNF by PT but insurance did not approve and hence he was discharged home 6/1.   6/2, within 24 hours of discharge, patient was brought to the ED by EMS after he was found on the floor with blood all over him. Patient reported that his socks/shoes were slippery and he fell.  He tried to get up and was unable, his Foley got pulled out in the process and started bleeding.  His next-door family found him on the ground with blood everywhere.  Patient was incoherent.  EMS brought him to ED.   In the ED he was hemodynamically stable.  ABG normal. CT head did not show any acute abnormality, showed chronic microvascular ischemic changes and remote right occipital infarct Chest x-ray showed bilateral increased interstitial markings suggestive of interstitial edema or multifocal interstitial pneumonia. X-ray pelvis did not show any evidence of fracture. Foley catheter was changed in the ED Admitted to [facility] for further management.  Details below.   Fall Impaired mobility/generalized weakness He does not have 24 hour supervision and his ex-wife believes that he is unsafe at home and needs placement.  Seen by PT OT once again and yet again they recommended SNF but this time palliative care was involved, they talked to the patient as well as family and they agreed to discharge with SNF which has been arranged for him today so he will be discharged.  It looks like he was also taking hydroxyzine  at home on as-needed basis, we were not giving him that here and he had not required it.  Hydroxyzine can also contribute to the falls as well so I have discontinued that medication.  Hematuria Secondary to traumatic Foley removal at home while on Eliquis. He required Foley catheter insertion during last hospitalization for acute urinary retention and was discharged with the same. Foley catheter was changed in the ED. hematuria was controlled  initially but became severe.  Required stat urology consultation, Foley catheter was reinserted and he was started on empiric Bactrim for 3 doses and Flomax.  Hematuria has resolved.  Urology recommended discharging with Foley catheter and follow-up in office for voiding trial in 1 week. Patient was on Eliquis since last hospitalization for PE.  He however has been refusing to take it.  It was taken off of the orders by previous hospitalist.   Chronic systolic CHF EF less than 20% at least since 2018 which had eventually improved but echo during recent hospitalization showed EF down to less than 20% again.  During recent hospitalization, he was seen by advanced heart failure, required IV diuresis and dobutamine drip. PTA, he was on Lasix 40 mg daily, Aldactone 12.5 mg daily.  Volume status currently stable.  While in the ED, he was given IV Lasix.  Continued jump to 2.17, improved to 1.54.  Lasix and Aldactone were on hold, he will now resume both of these medications.   Afib Rate controlled with amiodarone Eliquis plan as above   Recent acute PE Diagnosed during last hospitalization Eliquis plan as above   Type 2 diabetes mellitus A1c 7.6 on January 2024.  Hemoglobin A1c 7 this time.  Not on any meds prior to hospitalization.  He is 82 year old and he is at goal hemoglobin A1c.     CAD HLD Currently on Eliquis and Zetia   Hypotension: Lactic acid normal.  Started on amiodarone and 5 mg p.o. 3 times daily.   Cognitive impairment Noted during last hospitalization when he also had suicidal ideation and was seen by psychiatry. Continue delirium precautions Continue escitalopram   Hypokalemia: Replaced.  Discharge plan was discussed with patient and/or family member and they verbalized understanding and agreed with it.  Discharge Diagnoses:  Principal Problem:   Fall at home, initial encounter Active Problems:   Acute pulmonary embolism (HCC)   Paroxysmal atrial fibrillation (HCC)    Benign essential HTN   Type 2 diabetes mellitus with hyperlipidemia (HCC)   Hyperlipidemia   Coronary artery disease involving native coronary artery of native heart without angina pectoris   Acute urinary retention   MCI (mild cognitive impairment)   Chronic systolic congestive heart failure (HCC)   Failure to thrive in adult    Discharge Instructions   Allergies as of 03/27/2023       Reactions   Lipitor [atorvastatin] Other (See Comments)   Damage to calf muscles   Vytorin [ezetimibe-simvastatin] Other (See Comments)   Damage to calf muscles   Amoxicillin Hives   Cephalexin Itching   Statins Other (See Comments)   Muscle pain   Tape Rash, Other (See Comments)   Broke out the skin        Medication List     STOP taking these medications    apixaban 5 MG Tabs tablet Commonly known as: ELIQUIS   hydrOXYzine 10 MG tablet Commonly known as: ATARAX       TAKE these medications    acetaminophen 325 MG tablet Commonly known as:  TYLENOL Take 2 tablets (650 mg total) by mouth every 4 (four) hours as needed for mild pain (or temp > 37.5 C (99.5 F)).   albuterol 108 (90 Base) MCG/ACT inhaler Commonly known as: VENTOLIN HFA Inhale 2 puffs into the lungs every 6 (six) hours as needed for wheezing or shortness of breath.   amiodarone 200 MG tablet Commonly known as: PACERONE Take 1 tablet (200 mg total) by mouth 2 (two) times daily.   cholecalciferol 1000 units tablet Commonly known as: VITAMIN D Take 1 tablet (1,000 Units total) by mouth daily.   escitalopram 20 MG tablet Commonly known as: Lexapro Take 1 tablet (20 mg total) by mouth daily. What changed: when to take this   esomeprazole 40 MG capsule Commonly known as: NEXIUM TAKE 1 CAPSULE BY MOUTH DAILY AS NEEDED FOR HEARTBURN What changed:  how much to take how to take this when to take this additional instructions   ezetimibe 10 MG tablet Commonly known as: ZETIA Take 1 tablet (10 mg total) by  mouth daily.   furosemide 40 MG tablet Commonly known as: LASIX Take 1 tablet (40 mg total) by mouth daily.   midodrine 5 MG tablet Commonly known as: PROAMATINE Take 1 tablet (5 mg total) by mouth 3 (three) times daily with meals.   olopatadine 0.1 % ophthalmic solution Commonly known as: PATANOL INSTILL 1 DROP INTO BOTH EYS DAILY AS NEEDED FOR ALLERGIES   ondansetron 4 MG disintegrating tablet Commonly known as: ZOFRAN-ODT TAKE 2 TABLETS BY MOUTH AS NEEDED   OXYGEN Inhale into the lungs.   potassium chloride 10 MEQ tablet Commonly known as: KLOR-CON M Take 1 tablet (10 mEq total) by mouth daily.   rosuvastatin 40 MG tablet Commonly known as: CRESTOR Take 1 tablet (40 mg total) by mouth daily.   spironolactone 25 MG tablet Commonly known as: ALDACTONE Take 1/2 tablet (12.5 mg total) by mouth daily.   tamsulosin 0.4 MG Caps capsule Commonly known as: FLOMAX Take 1 capsule (0.4 mg total) by mouth daily after supper.        Contact information for follow-up providers     [name], [place] Follow up in 1 week(s).   Specialty: Physician Assistant Contact information: [address] [place] [address] [phone]              Contact information for after-discharge care     Destination     [facility] Preferred SNF .   Service: Skilled Nursing Contact information: [address] [place] [address] [phone]                    Allergies  Allergen Reactions   Lipitor [Atorvastatin] Other (See Comments)    Damage to calf muscles   Vytorin [Ezetimibe-Simvastatin] Other (See Comments)    Damage to calf muscles   Amoxicillin Hives   Cephalexin Itching   Statins Other (See Comments)    Muscle pain    Tape Rash and Other (See Comments)    Broke out the skin    Consultations: None   Procedures/Studies: CT Head Wo Contrast  Result Date: 03/22/2023 CLINICAL DATA:  Mental status change, unknown  cause EXAM: CT HEAD WITHOUT CONTRAST TECHNIQUE: Contiguous axial images were obtained from the base of the skull through the vertex without intravenous contrast. RADIATION DOSE REDUCTION: This exam was performed according to the departmental dose-optimization program which includes automated exposure control, adjustment of the mA and/or kV according to patient size  and/or use of iterative reconstruction technique. COMPARISON:  02/27/2020 FINDINGS: Brain: No evidence of acute infarction, hemorrhage, hydrocephalus, extra-axial collection or mass lesion/mass effect. Encephalomalacia in the right occipital lobe compatible with remote infarct. Extensive low-density changes within the periventricular and subcortical white matter most compatible with chronic microvascular ischemic change. Mild diffuse cerebral volume loss. Vascular: Atherosclerotic calcifications involving the large vessels of the skull base. No unexpected hyperdense vessel. Skull: Normal. Negative for fracture or focal lesion. Sinuses/Orbits: No acute finding. Other: None. IMPRESSION: 1. No acute intracranial abnormality. 2. Chronic microvascular ischemic change and cerebral volume loss. 3. Remote right occipital lobe infarct. Electronically Signed   By: Duanne Guess D.O.   On: 03/22/2023 11:58   DG Pelvis Portable  Result Date: 03/22/2023 CLINICAL DATA:  Trauma, fall EXAM: PORTABLE PELVIS 1-2 VIEWS COMPARISON:  None Available. FINDINGS: No displaced fracture or dislocation is seen. Joint spaces in both hips appear symmetrical. Degenerative changes are noted in visualized lower lumbar spine. IMPRESSION: No displaced fracture or dislocation is seen.  Lumbar spondylosis. Electronically Signed   By: Ernie Avena M.D.   On: 03/22/2023 11:09   DG Chest Port 1 View  Result Date: 03/22/2023 CLINICAL DATA:  Trauma, fall EXAM: PORTABLE CHEST 1 VIEW COMPARISON:  03/07/2023 FINDINGS: Transverse diameter of heart is increased. Increased interstitial  markings are seen in the parahilar regions, lower lung fields. Increased markings are seen in right upper lung field. Left lateral CP angle is indistinct. There is no pneumothorax. IMPRESSION: Cardiomegaly. Increased interstitial markings are seen in both lungs suggesting interstitial edema or multifocal interstitial pneumonia. Small left pleural effusion. Electronically Signed   By: Ernie Avena M.D.   On: 03/22/2023 11:08   [place] EKG SITE RITE  Result Date: 03/09/2023 If Site Rite image not attached, placement could not be confirmed due to current cardiac rhythm.  ECHOCARDIOGRAM COMPLETE  Result Date: 03/07/2023    ECHOCARDIOGRAM REPORT   Patient Name:   Christian [name] Date of Exam: 03/07/2023 Medical Rec #:  [id number]     Height:       66.0 in Accession #:    [id number]    Weight:       156.6 lb Date of Birth:  12-21-1940     BSA:          1.802 m Patient Age:    81 years      BP:           102/87 mmHg Patient Gender: M             HR:           87 bpm. Exam Location:  Inpatient Procedure: 2D Echo, Cardiac Doppler and Color Doppler  Results communicated to Dr Maryfrances Bunnell at 18:36 on 03/07/23. Indications:    dilated cardiomyopathy  History:        Patient has prior history of Echocardiogram examinations, most                 recent 02/26/2020. CHF, CAD; Risk Factors:Diabetes and                 Dyslipidemia.  Sonographer:    Delcie Roch RDCS Referring Phys: [id number] Vinnie Level SMITH IMPRESSIONS  1. Left ventricular ejection fraction, by estimation, is <20%. The left ventricle has severely decreased function. The left ventricle demonstrates global hypokinesis. The left ventricular internal cavity size was mildly dilated. There is mild left ventricular hypertrophy. Left ventricular diastolic parameters are indeterminate.  2. Right ventricular systolic function  is moderately reduced. The right ventricular size is normal. Tricuspid regurgitation signal is inadequate for assessing PA pressure.  3. Left atrial  size was mildly dilated.  4. The mitral valve is degenerative. Mild to moderate mitral valve regurgitation. Moderate mitral annular calcification.  5. The aortic valve is tricuspid. There is moderate calcification of the aortic valve. Aortic valve regurgitation is trivial. MG across AV only , but visually appears stenotic and AVA 1.0cm^2 and DI (0.26) suggest moderate AS. Very low SV index (14  cc/m^2), suspect low flow low gradient moderate AS  6. Aortic dilatation noted. There is dilatation of the ascending aorta, measuring 41 mm.  7. The inferior vena cava is dilated in size with <50% respiratory variability, suggesting right atrial pressure of 15 mmHg. FINDINGS  Left Ventricle: Left ventricular ejection fraction, by estimation, is <20%. The left ventricle has severely decreased function. The left ventricle demonstrates global hypokinesis. The left ventricular internal cavity size was mildly dilated. There is mild left ventricular hypertrophy. Left ventricular diastolic parameters are indeterminate. Right Ventricle: The right ventricular size is normal. Right vetricular wall thickness was not well visualized. Right ventricular systolic function is moderately reduced. Tricuspid regurgitation signal is inadequate for assessing PA pressure. Left Atrium: Left atrial size was mildly dilated. Right Atrium: Right atrial size was normal in size. Pericardium: There is no evidence of pericardial effusion. Mitral Valve: The mitral valve is degenerative in appearance. Moderate mitral annular calcification. Mild to moderate mitral valve regurgitation. Tricuspid Valve: The tricuspid valve is normal in structure. Tricuspid valve regurgitation is trivial. Aortic Valve: The aortic valve is tricuspid. There is moderate calcification of the aortic valve. Aortic valve regurgitation is trivial. Aortic valve mean gradient measures 4.5 mmHg. Aortic valve peak gradient measures 8.3 mmHg. Aortic valve area, by VTI  measures 1.19  cm. Pulmonic Valve: The pulmonic valve was not well visualized. Pulmonic valve regurgitation is trivial. Aorta: Aortic dilatation noted and the aortic root is normal in size and structure. There is dilatation of the ascending aorta, measuring 41 mm. Venous: The inferior vena cava is dilated in size with less than 50% respiratory variability, suggesting right atrial pressure of 15 mmHg. IAS/Shunts: The interatrial septum was not well visualized.  LEFT VENTRICLE PLAX 2D LVIDd:         5.80 cm LVIDs:         5.20 cm LV PW:         1.20 cm LV IVS:        1.20 cm LVOT diam:     2.30 cm LV SV:         30 LV SV Index:   16 LVOT Area:     4.15 cm  RIGHT VENTRICLE            IVC RV Basal diam:  2.90 cm    IVC diam: 2.20 cm RV S prime:     6.31 cm/s TAPSE (M-mode): 1.2 cm LEFT ATRIUM             Index        RIGHT ATRIUM           Index LA diam:        4.10 cm 2.27 cm/m   RA Area:     19.30 cm LA Vol (A2C):   63.9 ml 35.45 ml/m  RA Volume:   53.90 ml  29.91 ml/m LA Vol (A4C):   62.5 ml 34.68 ml/m LA Biplane Vol: 64.5 ml 35.79 ml/m  AORTIC VALVE  AV Area (Vmax):    1.37 cm AV Area (Vmean):   1.26 cm AV Area (VTI):     1.19 cm AV Vmax:           144.00 cm/s AV Vmean:          96.100 cm/s AV VTI:            0.250 m AV Peak Grad:      8.3 mmHg AV Mean Grad:      4.5 mmHg LVOT Vmax:         47.57 cm/s LVOT Vmean:        29.133 cm/s LVOT VTI:          0.071 m LVOT/AV VTI ratio: 0.29  AORTA Ao Root diam: 3.60 cm Ao Asc diam:  4.10 cm MR Peak grad:    53.6 mmHg MR Mean grad:    34.0 mmHg    SHUNTS MR Vmax:         366.00 cm/s  Systemic VTI:  0.07 m MR Vmean:        276.0 cm/s   Systemic Diam: 2.30 cm MR PISA:         1.01 cm MR PISA Eff ROA: 11 mm MR PISA Radius:  0.40 cm Epifanio Lesches MD Electronically signed by Epifanio Lesches MD Signature Date/Time: 03/07/2023/6:37:46 PM    Final    CT Angio Chest PE W and/or Wo Contrast  Result Date: 03/07/2023 CLINICAL DATA:  Shortness of breath.  Elevated D-dimer.  EXAM: CT ANGIOGRAPHY CHEST WITH CONTRAST TECHNIQUE: Multidetector CT imaging of the chest was performed using the standard protocol during bolus administration of intravenous contrast. Multiplanar CT image reconstructions and MIPs were obtained to evaluate the vascular anatomy. RADIATION DOSE REDUCTION: This exam was performed according to the departmental dose-optimization program which includes automated exposure control, adjustment of the mA and/or kV according to patient size and/or use of iterative reconstruction technique. CONTRAST:  75mL OMNIPAQUE IOHEXOL 350 MG/ML SOLN COMPARISON:  11/15/2016 FINDINGS: Cardiovascular: Pulmonary emboli are seen in the central right upper lobe pulmonary artery and segmental branches, an anterior segmental branch in the right lower lobe pulmonary artery, and posterior segmental branches of the left lower lobe pulmonary artery. No evidence of elevated RV/LV ratio. No evidence of thoracic aortic dissection or aneurysm. Contrast reflux into IVC and hepatic veins is consistent with right heart insufficiency. Mediastinum/Nodes: No masses or pathologically enlarged lymph nodes identified. Lungs/Pleura: Small pleural effusions are seen bilaterally with mild dependent atelectasis. No evidence of pulmonary consolidation or mass. Upper abdomen: Hepatic cirrhosis noted. 2.3 cm low-attenuation lesion is seen in the lateral segment of the left hepatic lobe, which cannot be characterized on this study. Musculoskeletal: No suspicious bone lesions identified. Review of the MIP images confirms the above findings. IMPRESSION: Bilateral pulmonary embolism, as described above. No CT evidence of right heart strain. Small bilateral pleural effusions and mild dependent atelectasis. Hepatic cirrhosis. Findings consistent with right heart insufficiency. 2.3 cm low-attenuation lesion in left hepatic lobe, which cannot be characterized on this study. Recommend abdomen MRI without and with contrast for  further characterization. Critical Value/emergent results were called by telephone at the time of interpretation on 03/07/2023 at 12:53 pm to provider Ernie Avena , who verbally acknowledged these results. Electronically Signed   By: Danae Orleans M.D.   On: 03/07/2023 12:57   DG Chest Portable 1 View  Result Date: 03/07/2023 CLINICAL DATA:  Shortness of breath EXAM: PORTABLE CHEST 1 VIEW COMPARISON:  02/24/2020  FINDINGS: Cardiomegaly, vascular congestion. Mild perihilar and infrahilar airspace opacities, likely edema. No effusions or acute bony abnormality. IMPRESSION: Mild CHF. Electronically Signed   By: Charlett Nose M.D.   On: 03/07/2023 10:13     Discharge Exam: Vitals:   03/26/23 2247 03/27/23 0759  BP: (!) 89/65 (!) 84/53  Pulse: 92 86  Resp:  18  Temp:  (!) 97.5 F (36.4 C)  SpO2: 98% 92%   Vitals:   03/26/23 1559 03/26/23 2030 03/26/23 2247 03/27/23 0759  BP: 94/62 94/68 (!) 89/65 (!) 84/53  Pulse: 80 90 92 86  Resp: 16 18  18   Temp: 97.8 F (36.6 C) 98.2 F (36.8 C)  (!) 97.5 F (36.4 C)  TempSrc: Oral Oral    SpO2: 92% 93% 98% 92%    General: Pt is alert, awake, not in acute distress Cardiovascular: RRR, S1/S2 +, no rubs, no gallops Respiratory: CTA bilaterally, no wheezing, no rhonchi Abdominal: Soft, NT, ND, bowel sounds + Extremities: no edema, no cyanosis    The results of significant diagnostics from this hospitalization (including imaging, microbiology, ancillary and laboratory) are listed below for reference.     Microbiology: Recent Results (from the past 240 hour(s))  Culture, blood (single)     Status: None (Preliminary result)   Collection Time: 03/22/23 10:39 AM   Specimen: BLOOD LEFT ARM  Result Value Ref Range Status   Specimen Description BLOOD LEFT ARM  Final   Special Requests   Final    BOTTLES DRAWN AEROBIC ONLY Blood Culture adequate volume   Culture   Final    NO GROWTH 4 DAYS Performed at [facility], [address]  [place], [place] [address]    Report Status PENDING  Incomplete     Labs: BNP (last 3 results) Recent Labs    03/07/23 0952 03/23/23 0819  BNP 2,668.0* 1,758.0*   Basic Metabolic Panel: Recent Labs  Lab 03/21/23 0018 03/22/23 1026 03/22/23 1741 03/23/23 0819 03/25/23 0807 03/26/23 0753  NA 136 134* 136 135 133* 134*  K 4.0 4.6 4.0 4.2 3.2* 3.1*  CL 98 96*  --  97* 94* 96*  CO2 25 24  --  22 28 28   GLUCOSE 109* 117*  --  154* 114* 112*  BUN 21 23  --  27* 34* 24*  CREATININE 1.27* 1.47*  --  2.17* 1.98* 1.54*  CALCIUM 8.3* 9.0  --  8.2* 7.9* 7.8*   Liver Function Tests: Recent Labs  Lab 03/22/23 1026  AST 28  ALT 20  ALKPHOS 38  BILITOT 1.5*  PROT 6.8  ALBUMIN 3.5   No results for input(s): "LIPASE", "AMYLASE" in the last 168 hours. No results for input(s): "AMMONIA" in the last 168 hours. CBC: Recent Labs  Lab 03/22/23 1026 03/22/23 1741 03/23/23 0819 03/25/23 0807 03/26/23 0753  WBC 7.6  --  12.8* 7.3 6.2  NEUTROABS 5.9  --   --  5.6 4.1  HGB 12.5* 10.9* 12.0* 9.9* 10.2*  HCT 40.2 32.0* 38.4* 30.6* 32.3*  MCV 90.7  --  91.6 88.2 89.0  PLT 273  --  215 231 252   Cardiac Enzymes: Recent Labs  Lab 03/22/23 1026  CKTOTAL 87   BNP: Invalid input(s): "POCBNP" CBG: Recent Labs  Lab 03/25/23 2045 03/26/23 1219 03/26/23 1711 03/26/23 2114 03/27/23 0836  GLUCAP 154* 121* 134* 116* 125*   D-Dimer No results for input(s): "DDIMER" in the last 72 hours. Hgb A1c Recent Labs  03/25/23 1232  HGBA1C 7.0*   Lipid Profile No results for input(s): "CHOL", "HDL", "LDLCALC", "TRIG", "CHOLHDL", "LDLDIRECT" in the last 72 hours. Thyroid function studies No results for input(s): "TSH", "T4TOTAL", "T3FREE", "THYROIDAB" in the last 72 hours.  Invalid input(s): "FREET3" Anemia work up No results for input(s): "VITAMINB12", "FOLATE", "FERRITIN", "TIBC", "IRON", "RETICCTPCT" in the last 72 hours. Urinalysis    Component Value Date/Time   COLORURINE  YELLOW 01/06/2018 1602   APPEARANCEUR Clear 04/07/2018 1459   LABSPEC 1.020 01/06/2018 1602   PHURINE 6.5 01/06/2018 1602   GLUCOSEU Negative 04/07/2018 1459   GLUCOSEU NEGATIVE 01/06/2018 1602   HGBUR NEGATIVE 01/06/2018 1602   BILIRUBINUR 1 01/22/2023 1143   BILIRUBINUR Negative 04/07/2018 1459   KETONESUR negative 10/06/2018 1634   KETONESUR TRACE (A) 01/06/2018 1602   PROTEINUR Positive (A) 01/22/2023 1143   PROTEINUR Negative 04/07/2018 1459   UROBILINOGEN 1.0 01/22/2023 1143   UROBILINOGEN 0.2 01/06/2018 1602   NITRITE Negative 01/22/2023 1143   NITRITE Negative 04/07/2018 1459   NITRITE NEGATIVE 01/06/2018 1602   LEUKOCYTESUR Negative 01/22/2023 1143   LEUKOCYTESUR Negative 04/07/2018 1459   Sepsis Labs Recent Labs  Lab 03/22/23 1026 03/23/23 0819 03/25/23 0807 03/26/23 0753  WBC 7.6 12.8* 7.3 6.2   Microbiology Recent Results (from the past 240 hour(s))  Culture, blood (single)     Status: None (Preliminary result)   Collection Time: 03/22/23 10:39 AM   Specimen: BLOOD LEFT ARM  Result Value Ref Range Status   Specimen Description BLOOD LEFT ARM  Final   Special Requests   Final    BOTTLES DRAWN AEROBIC ONLY Blood Culture adequate volume   Culture   Final    NO GROWTH 4 DAYS Performed at [facility], [address], [place], [place] [address]    Report Status PENDING  Incomplete     Time coordinating discharge: Over 30 minutes  SIGNED:   Hughie Closs, MD  [facility] 03/27/2023, 9:36 AM *Please note that this is a verbal dictation therefore any spelling or grammatical errors are due to the "Dragon Medical One" system interpretation. If 7PM-7AM, please contact night-coverage [email or web address]

## 2023-03-27 NOTE — TOC Transition Note (Addendum)
Transition of Care [facility]) - CM/SW Discharge Note   Patient Details  Name: Christian [name] MRN: [id number] Date of Birth: Nov 13, 1940  Transition of Care [facility]) CM/SW Contact:  Deatra Robinson, [place] Phone Number: 03/27/2023, 11:07 AM   Clinical Narrative: Pt for dc to [facility] today. Insurance auth received (6/7 - 6/11 Plan Auth ID [id number]) and confirmed with Whitney at [facility] they are prepared to admit pt to room 115A. Pt's primary contact [name] aware of dc and reports agreeable. RN provided with number for report and PTAR arranged for transport. SW signing off at dc.   Dellie Burns, MSW, LCSW [phone] (coverage)        Final next level of care: Skilled Nursing Facility Barriers to Discharge: No Barriers Identified   Patient Goals and CMS Choice CMS Medicare.gov Compare Post Acute Care list provided to:: Patient Choice offered to / list presented to : Patient  Discharge Placement                Patient chooses bed at: Other - please specify in the comment section below: [facility]) Patient to be transferred to facility by: PTAR Name of family member notified: [name]/friend/ex-wife Patient and family notified of of transfer: 03/27/23  Discharge Plan and Services Additional resources added to the After Visit Summary for                                       Social Determinants of Health (SDOH) Interventions SDOH Screenings   Food Insecurity: No Food Insecurity (03/26/2023)  Housing: Low Risk  (03/26/2023)  Transportation Needs: No Transportation Needs (03/26/2023)  Utilities: Not At Risk (03/26/2023)  Alcohol Screen: Low Risk  (10/12/2019)  Depression (PHQ2-9): Medium Risk (04/29/2022)  Financial Resource Strain: Low Risk  (03/10/2023)  Tobacco Use: Low Risk  (03/22/2023)     Readmission Risk Interventions     No data to display

## 2023-03-27 NOTE — Care Management Important Message (Signed)
Important Message  Patient Details  Name: Christian [name] MRN: [id number] Date of Birth: Sep 07, 1941   Medicare Important Message Given:  Yes Patient left prior to IM delivery will mail a copy to the patient home address.     [name] 03/27/2023, 1:15 PM

## 2023-03-27 NOTE — Progress Notes (Signed)
Patient ID: Christian [name], male   DOB: 12/06/1940, 82 y.o.   MRN: [id number]    Progress Note from the Palliative Medicine Team at [facility]   Patient Name: Christian [name]        Date: 03/27/2023 DOB: 10/09/1941  Age: 82 y.o. MRN#: [id number] Attending Physician: Hughie Closs, MD Primary Care Physician: [name], [place] Admit Date: 03/22/2023   Medical records reviewed   Christian [name] is an 82 year old male presenting to [facility] found down after a fall.  He was recently hospitalized from 5/18 through 6/1 for acute on chronic CHF, A-fib, and acute pulmonary embolism.  He was readmitted within about 24 hours.  PMH significant for systolic CHF, CAD, HTN, and T2DM.   He was discharged from hospital with a Foley catheter in place, somehow tripped and traumatically removed it.   PMT worked with patient and family on last admission.   Patient lives alone with his 2 dogs.  His ex-wife and main caregiver lives across the street and checks on him very frequently.  Family continue to express concern for safety at home  Patient and family face ongoing treatment option decisions, advanced directive decisions and anticipatory care needs.  This NP assessed patient at the bedside as follow-up for palliative medicine needs and emotional support.  Patient is alert and oriented, talkative and in a positive mood.     No family at bedside   I was able to speak by phone with main caregiver/Laurraine Hurshel Party, she is patient's ex wife.   Ongoing education regarding patient's multiple comorbidities and likely increasing nursing care needs.   Education offered on patient's multiple comorbidities and his ongoing high risk for decompensation.  Again expressed concern for if discharge.  Today patient verbalizes agreement with SNF for short-term rehab.  Education and recommendation for caregiver considerations once patient is discharged home.  Patient is high risk for decompensation   Plan of  Care: DNR/DNI -No artificial feeding or hydration now or in the future -Determine use of antibiotics if and when infection occurs -When medically stable, patient is open to short-term rehab if eligible.  Ultimately patient wishes to return home.    Partially completed MOST form left at the bedside, family will follow-up with completion at facility.    Education offered today regarding  the importance of continued conversation with family and their  medical providers regarding overall plan of care and treatment options,  ensuring decisions are within the context of the patients values and GOCs.  Education offered on hospice benefit; philosophy and eligibility  Questions and concerns addressed   Discussed with Dr Jacqulyn Bath and [facility] team    Time:  50 minutes  Detailed review of medical records ( labs, imaging, vital signs), medically appropriate exam ( MS, skin, resp)   discussed with treatment team, counseling and education to patient, family, staff, documenting clinical information, medication management, coordination of care    [name] NP  Palliative Medicine Team Team Phone # [phone] Pager [phone]

## 2023-03-27 NOTE — Progress Notes (Signed)
Pt left to [facility] in stable condition. Report given to [facility]. [name] (pt spouse) made aware of patient's transfer.

## 2023-03-27 NOTE — Telephone Encounter (Signed)
Received fax from Carlyle Lipa signed and I faxed it back to [place].

## 2023-03-30 NOTE — Telephone Encounter (Signed)
Patient Advocate Encounter  Eliquis has been discontinued for this patient upon discharge. Medication assistance not needed at this time. Will be available for future needs.

## 2023-04-21 ENCOUNTER — Telehealth: Payer: Self-pay | Admitting: Physician Assistant

## 2023-04-21 NOTE — Telephone Encounter (Signed)
Pt states they need a referral to [facility] for [facility]. Please advise.

## 2023-04-21 NOTE — Telephone Encounter (Signed)
Spoke to Elysian, told her I am not sure if I can do referral to Home Health due to pt has not been seen since 01/2023. I asked her why the hospital did not due one? She said she tried and they did not, has been asking for other things to, but they would not do them. Told her I will call [facility] and she is I need to do a whole new referral, if we do he will need to be seen, but I will let you know and get back to you. [name] verbalized understanding.

## 2023-04-21 NOTE — Telephone Encounter (Signed)
Spoke to Cassandra told her I spoke with [facility] and we will need to do a new referral for Home Health so pt will need to be seen. I suggest you contact the nursing facility that he was at and have them do a referral for Home Health. [name] said, she has tried to contact them but no response, he is unable to walk. Told her sorry he will have to be seen in order to do new referral. [name] verbalized understanding.

## 2023-04-21 NOTE — Telephone Encounter (Signed)
Called Christian [name] spoke to Christian [name], asked her if pt was discharged from there services? Christian [name] said yes, on 6/7. Do I need to do a whole new HH referral, we have not seen pt since 01/2023 he is not able to walk. Christian [name] said yes, pt will need to be seen in order for them to see pt. Told her okay.

## 2023-04-22 ENCOUNTER — Other Ambulatory Visit (HOSPITAL_COMMUNITY): Payer: Self-pay

## 2023-04-27 ENCOUNTER — Ambulatory Visit: Payer: Medicare Other | Admitting: Physician Assistant

## 2023-04-28 ENCOUNTER — Telehealth: Payer: Self-pay | Admitting: Physician Assistant

## 2023-04-28 NOTE — Telephone Encounter (Signed)
..  Home Health Verbal Orders  Agency:    CallerArlys John, [place]  [phone]  Requesting OT/ PT/ Skilled nursing/ Social Work/ Speech:  PT and Skilled nursing   Reason for Request:  Bed mobility, transfer training, and edge of bed work   Frequency:    1 x 4 weeks  1 x 1 week- Evaluation for skilled nursing   HH needs F2F w/in last 30 days

## 2023-04-29 NOTE — Telephone Encounter (Signed)
[place], [place] [phone], verbal orders given for PT once a week x 4 weeks, 1 x visit for Skilled Nursing Evaluation, okay per [facility] for pt to have done. Arlys John verbalized understanding.

## 2023-05-06 ENCOUNTER — Encounter: Payer: Self-pay | Admitting: Physician Assistant

## 2023-05-06 ENCOUNTER — Telehealth: Payer: Medicare Other | Admitting: Physician Assistant

## 2023-05-06 ENCOUNTER — Telehealth: Payer: Self-pay | Admitting: *Deleted

## 2023-05-06 DIAGNOSIS — G3184 Mild cognitive impairment, so stated: Secondary | ICD-10-CM

## 2023-05-06 DIAGNOSIS — I2699 Other pulmonary embolism without acute cor pulmonale: Secondary | ICD-10-CM | POA: Diagnosis not present

## 2023-05-06 DIAGNOSIS — F419 Anxiety disorder, unspecified: Secondary | ICD-10-CM | POA: Diagnosis not present

## 2023-05-06 DIAGNOSIS — E118 Type 2 diabetes mellitus with unspecified complications: Secondary | ICD-10-CM | POA: Diagnosis not present

## 2023-05-06 DIAGNOSIS — R627 Adult failure to thrive: Secondary | ICD-10-CM

## 2023-05-06 DIAGNOSIS — N3942 Incontinence without sensory awareness: Secondary | ICD-10-CM

## 2023-05-06 MED ORDER — "XEROFORM PETROLAT PATCH 4""X4"" EX PADS": 1.0000 | MEDICATED_PAD | Freq: Every day | CUTANEOUS | 1 refills | Status: DC | PRN

## 2023-05-06 MED ORDER — CLONAZEPAM 0.5 MG PO TABS: 0.5000 mg | ORAL_TABLET | Freq: Two times a day (BID) | ORAL | 3 refills | Status: DC | PRN

## 2023-05-06 NOTE — Telephone Encounter (Signed)
Spoke to [place], told her calling about incontinence supplies. Told her you need to call [facility] at [phone] and let will ask you questions about what you need and then send [place] an order. Also in regards to increasing nursing care just contact them and they will send [name] orders. Christian [name] verbalized understanding.

## 2023-05-06 NOTE — Progress Notes (Signed)
Virtual Visit via Video Note   I, [name], connected with  Christian [name]  ([id number], 05/28/1941) on 05/06/23 at 10:40 AM EDT by a video-enabled telemedicine application and verified that I am speaking with the correct person using two identifiers.  Location: Patient: Home Provider: [facility] [place] office   I discussed the limitations of evaluation and management by telemedicine and the availability of in person appointments. The patient expressed understanding and agreed to proceed.    History of Present Illness: Christian [name] is a 82 y.o. who identifies as a male who was assigned male at birth, and is being seen today for follow-up of medical issues.  Acute PULMONARY EMBOLISM, unprovoked Diagnosis on 5/18 in ER -- thought to be due to low flow in setting of severe biventricular heart failure Was on Plavix prior to diagnosis due to history of CVA Per chart, was started on heparin and transitioned to Eliquis on 03/09/23 He took this as prescribed Readmitted to hospital on 03/22/23 due to traumatic foley and hematuria; eliquis was stopped because patient "refused to take it" and due to traumatic bleed  Per caregiver, he is taking all medications as prescribed and she would like to resume this  Per cardiology note while in hospital "continue medical therapy with BB, resume PTA crestor 40mg , and zetia 10mg ; no need for antiplatelet if on Eliquis from cardiology standpoint, noted plavix use for hx of cerebrovascular accident."   Incontinence Remains incontinent of urine and stool Caregiver is asking for supplies He has a foley that is being managed by urology and home health RN  Caregiver is asking for more frequent visits from home health RN   Diabetes Current DM meds: metformin 500 mg daily. Blood sugars at home are: not checked. Patient is  compliant with medications however most recent A1c was 7% and he was not taking medication in hospital, as he is at an  age-appropriate A1c.. Denies: hypoglycemic or hyperglycemic episodes or symptoms.   Lab Results  Component Value Date   HGBA1C 7.0 (H) 03/25/2023    Anxiety Was on xanax in hospital and caregiver feels as though he would benefit from this He is currently taking Lexapro 20 mg daily Denies suicidal ideation/hi Having trouble sleeping at night   FTT Overall declining Had palliative and hospice evaluation in hospital on 03/11/23  He is a DNR Caregiver states that if he returns to the hospital, he will not be returning home under her care  Problems:  Patient Active Problem List   Diagnosis Date Noted   Failure to thrive in adult 03/25/2023   Acute urinary retention 03/10/2023   Liver lesion 03/09/2023   Acute on chronic systolic CHF (congestive heart failure) (HCC) 03/07/2023   Cerebrovascular disease 03/07/2023   Acute pulmonary embolism (HCC) 03/07/2023   Paroxysmal atrial fibrillation (HCC) 03/07/2023   Mood disorder (HCC) 03/07/2023   GAD (generalized anxiety disorder) 04/29/2022   Stage 2 chronic kidney disease    Benign essential HTN    Chronic systolic congestive heart failure (HCC)    Acute ischemic right posterior cerebral artery (PCA) stroke (HCC) 03/01/2020   Allergic rhinitis due to pollen 03/29/2018   Oropharyngeal dysphagia 01/11/2018   GERD with esophagitis 01/11/2018   Claudication of both lower extremities (HCC) 01/06/2018   Nonischemic cardiomyopathy (HCC) 11/11/2017   Coronary artery disease involving native coronary artery of native heart without angina pectoris 11/11/2017   MCI (mild cognitive impairment) 07/08/2017   Type 2 diabetes mellitus with hyperlipidemia (HCC)  07/08/2017   Hyperlipidemia 05/25/2017    Allergies:  Allergies  Allergen Reactions   Lipitor [Atorvastatin] Other (See Comments)    Damage to calf muscles   Vytorin [Ezetimibe-Simvastatin] Other (See Comments)    Damage to calf muscles   Amoxicillin Hives   Cephalexin Itching    Statins Other (See Comments)    Muscle pain    Tape Rash and Other (See Comments)    Broke out the skin   Medications:  Current Outpatient Medications:    acetaminophen (TYLENOL) 325 MG tablet, Take 2 tablets (650 mg total) by mouth every 4 (four) hours as needed for mild pain (or temp > 37.5 C (99.5 F))., Disp:  , Rfl:    albuterol (VENTOLIN HFA) 108 (90 Base) MCG/ACT inhaler, Inhale 2 puffs into the lungs every 6 (six) hours as needed for wheezing or shortness of breath., Disp: 8 g, Rfl: 0   amiodarone (PACERONE) 200 MG tablet, Take 1 tablet (200 mg total) by mouth 2 (two) times daily., Disp: 60 tablet, Rfl: 0   Bismuth Tribromoph-Petrolatum (XEROFORM PETROLAT PATCH 4"X4") PADS, Apply 1 patch topically daily as needed., Disp: 25 each, Rfl: 1   cholecalciferol (VITAMIN D) 1000 units tablet, Take 1 tablet (1,000 Units total) by mouth daily., Disp: 30 tablet, Rfl: 0   clonazePAM (KLONOPIN) 0.5 MG tablet, Take 1 tablet (0.5 mg total) by mouth 2 (two) times daily as needed for anxiety., Disp: 60 tablet, Rfl: 3   escitalopram (LEXAPRO) 20 MG tablet, Take 1 tablet (20 mg total) by mouth daily. (Patient taking differently: Take 20 mg by mouth at bedtime.), Disp: 90 tablet, Rfl: 1   esomeprazole (NEXIUM) 40 MG capsule, TAKE 1 CAPSULE BY MOUTH DAILY AS NEEDED FOR HEARTBURN (Patient taking differently: Take 40 mg by mouth daily before breakfast.), Disp: 30 capsule, Rfl: 5   ezetimibe (ZETIA) 10 MG tablet, Take 1 tablet (10 mg total) by mouth daily., Disp: 90 tablet, Rfl: 3   furosemide (LASIX) 40 MG tablet, Take 1 tablet (40 mg total) by mouth daily., Disp: 30 tablet, Rfl: 0   midodrine (PROAMATINE) 5 MG tablet, Take 5 mg by mouth 3 (three) times daily with meals., Disp: , Rfl:    olopatadine (PATANOL) 0.1 % ophthalmic solution, INSTILL 1 DROP INTO BOTH EYS DAILY AS NEEDED FOR ALLERGIES, Disp: 5 mL, Rfl: 2   ondansetron (ZOFRAN-ODT) 4 MG disintegrating tablet, TAKE 2 TABLETS BY MOUTH AS NEEDED, Disp: 45  tablet, Rfl: 1   OXYGEN, Inhale into the lungs., Disp: , Rfl:    potassium chloride SA (KLOR-CON M) 10 MEQ tablet, Take 1 tablet (10 mEq total) by mouth daily., Disp: 30 tablet, Rfl: 0   rosuvastatin (CRESTOR) 40 MG tablet, Take 1 tablet (40 mg total) by mouth daily., Disp: 90 tablet, Rfl: 3   spironolactone (ALDACTONE) 25 MG tablet, Take 1/2 tablet (12.5 mg total) by mouth daily., Disp: 15 tablet, Rfl: 0   tamsulosin (FLOMAX) 0.4 MG CAPS capsule, Take 1 capsule (0.4 mg total) by mouth daily after supper., Disp: 30 capsule, Rfl: 0  Observations/Objective: Patient is well-developed, well-nourished in no acute distress.  Resting comfortably  at home.  Head is normocephalic, atraumatic.  No labored breathing.  Speech is clear and coherent with logical content.  Patient is alert and oriented at baseline.   Assessment and Plan: 1. Other acute pulmonary embolism, unspecified whether acute cor pulmonale present (HCC) Restart Eliquis 10 mg twice daily for 7 days followed by 5 mg twice daily.  Continue  this for 3 months and recommend discussing with cardiology if we want to continue further. Discussed that this is a high risk medication as he is high risk for falls  2. Anxiety Ongoing Denies SI/HI Continue Lexapro 20 mg daily Trial klonopin 0.5 mg twice daily as needed Discussed risk of falls - patient is essentially bed bound at this point Palliative care consulted  3. Type 2 diabetes mellitus with complication, without long-term current use of insulin (HCC) We are going to stop his metformin due to renal function and overall goals  4. MCI (mild cognitive impairment) - Amb Referral to Palliative Care  5. Failure to thrive in adult - Amb Referral to Palliative Care Caregiver is requesting xeroform for wound dressings -- will send in for patient Continue efforts to prevent skin breakdown  6. Urinary incontinence without sensory awareness Will try to get home health RN in more  frequently for caregiver support Will try to facilitate incontinence supplies as well through [facility]    Follow Up Instructions: I discussed the assessment and treatment plan with the patient. The patient was provided an opportunity to ask questions and all were answered. The patient agreed with the plan and demonstrated an understanding of the instructions.  A copy of instructions were sent to the patient via MyChart unless otherwise noted below.   The patient was advised to call back or seek an in-person evaluation if the symptoms worsen or if the condition fails to improve as anticipated.  [name], [place]

## 2023-05-06 NOTE — Patient Instructions (Signed)
It was great to see you!  Restart Eliquis 10 mg twice daily for 7 days followed by 5 mg twice daily If bleeding   Stop Plavix and metformin  Blood pressure twice weekly    Take care,  [name] PA-C

## 2023-05-09 ENCOUNTER — Inpatient Hospital Stay (HOSPITAL_COMMUNITY)
Admission: EM | Admit: 2023-05-09 | Discharge: 2023-05-13 | DRG: 698 | Disposition: A | Discharge status: Hospice | Payer: Medicare Other | Attending: Internal Medicine | Admitting: Internal Medicine

## 2023-05-09 ENCOUNTER — Emergency Department (HOSPITAL_COMMUNITY): Payer: Medicare Other

## 2023-05-09 DIAGNOSIS — N3001 Acute cystitis with hematuria: Secondary | ICD-10-CM | POA: Diagnosis present

## 2023-05-09 DIAGNOSIS — E871 Hypo-osmolality and hyponatremia: Secondary | ICD-10-CM | POA: Diagnosis present

## 2023-05-09 DIAGNOSIS — I959 Hypotension, unspecified: Secondary | ICD-10-CM | POA: Diagnosis present

## 2023-05-09 DIAGNOSIS — E1169 Type 2 diabetes mellitus with other specified complication: Secondary | ICD-10-CM | POA: Diagnosis present

## 2023-05-09 DIAGNOSIS — R570 Cardiogenic shock: Secondary | ICD-10-CM | POA: Diagnosis present

## 2023-05-09 DIAGNOSIS — R6521 Severe sepsis with septic shock: Secondary | ICD-10-CM | POA: Diagnosis present

## 2023-05-09 DIAGNOSIS — E1122 Type 2 diabetes mellitus with diabetic chronic kidney disease: Secondary | ICD-10-CM | POA: Diagnosis present

## 2023-05-09 DIAGNOSIS — R627 Adult failure to thrive: Secondary | ICD-10-CM | POA: Diagnosis present

## 2023-05-09 DIAGNOSIS — W19XXXA Unspecified fall, initial encounter: Secondary | ICD-10-CM | POA: Diagnosis present

## 2023-05-09 DIAGNOSIS — Z86711 Personal history of pulmonary embolism: Secondary | ICD-10-CM | POA: Diagnosis not present

## 2023-05-09 DIAGNOSIS — Z7409 Other reduced mobility: Secondary | ICD-10-CM | POA: Diagnosis not present

## 2023-05-09 DIAGNOSIS — I2510 Atherosclerotic heart disease of native coronary artery without angina pectoris: Secondary | ICD-10-CM | POA: Diagnosis present

## 2023-05-09 DIAGNOSIS — I350 Nonrheumatic aortic (valve) stenosis: Secondary | ICD-10-CM | POA: Diagnosis present

## 2023-05-09 DIAGNOSIS — Z515 Encounter for palliative care: Secondary | ICD-10-CM | POA: Diagnosis not present

## 2023-05-09 DIAGNOSIS — R338 Other retention of urine: Secondary | ICD-10-CM

## 2023-05-09 DIAGNOSIS — G9341 Metabolic encephalopathy: Secondary | ICD-10-CM | POA: Diagnosis present

## 2023-05-09 DIAGNOSIS — Z823 Family history of stroke: Secondary | ICD-10-CM

## 2023-05-09 DIAGNOSIS — W19XXXD Unspecified fall, subsequent encounter: Secondary | ICD-10-CM | POA: Diagnosis not present

## 2023-05-09 DIAGNOSIS — E876 Hypokalemia: Secondary | ICD-10-CM | POA: Diagnosis present

## 2023-05-09 DIAGNOSIS — G3184 Mild cognitive impairment, so stated: Secondary | ICD-10-CM

## 2023-05-09 DIAGNOSIS — H9190 Unspecified hearing loss, unspecified ear: Secondary | ICD-10-CM | POA: Diagnosis present

## 2023-05-09 DIAGNOSIS — Z8249 Family history of ischemic heart disease and other diseases of the circulatory system: Secondary | ICD-10-CM

## 2023-05-09 DIAGNOSIS — Z66 Do not resuscitate: Secondary | ICD-10-CM | POA: Diagnosis present

## 2023-05-09 DIAGNOSIS — M549 Dorsalgia, unspecified: Secondary | ICD-10-CM | POA: Diagnosis present

## 2023-05-09 DIAGNOSIS — A419 Sepsis, unspecified organism: Secondary | ICD-10-CM | POA: Diagnosis present

## 2023-05-09 DIAGNOSIS — Z841 Family history of disorders of kidney and ureter: Secondary | ICD-10-CM

## 2023-05-09 DIAGNOSIS — N1831 Chronic kidney disease, stage 3a: Secondary | ICD-10-CM | POA: Diagnosis present

## 2023-05-09 DIAGNOSIS — T83511A Infection and inflammatory reaction due to indwelling urethral catheter, initial encounter: Secondary | ICD-10-CM | POA: Diagnosis present

## 2023-05-09 DIAGNOSIS — Z8673 Personal history of transient ischemic attack (TIA), and cerebral infarction without residual deficits: Secondary | ICD-10-CM

## 2023-05-09 DIAGNOSIS — I5022 Chronic systolic (congestive) heart failure: Secondary | ICD-10-CM | POA: Diagnosis present

## 2023-05-09 DIAGNOSIS — Z6820 Body mass index (BMI) 20.0-20.9, adult: Secondary | ICD-10-CM

## 2023-05-09 DIAGNOSIS — J9811 Atelectasis: Secondary | ICD-10-CM | POA: Diagnosis present

## 2023-05-09 DIAGNOSIS — Y92009 Unspecified place in unspecified non-institutional (private) residence as the place of occurrence of the external cause: Secondary | ICD-10-CM

## 2023-05-09 DIAGNOSIS — F0390 Unspecified dementia without behavioral disturbance: Secondary | ICD-10-CM | POA: Diagnosis present

## 2023-05-09 DIAGNOSIS — I480 Paroxysmal atrial fibrillation: Secondary | ICD-10-CM | POA: Diagnosis present

## 2023-05-09 DIAGNOSIS — Z7189 Other specified counseling: Secondary | ICD-10-CM | POA: Diagnosis not present

## 2023-05-09 DIAGNOSIS — R9431 Abnormal electrocardiogram [ECG] [EKG]: Secondary | ICD-10-CM | POA: Diagnosis present

## 2023-05-09 DIAGNOSIS — D72829 Elevated white blood cell count, unspecified: Secondary | ICD-10-CM

## 2023-05-09 DIAGNOSIS — I452 Bifascicular block: Secondary | ICD-10-CM | POA: Diagnosis present

## 2023-05-09 DIAGNOSIS — E872 Acidosis, unspecified: Secondary | ICD-10-CM | POA: Diagnosis present

## 2023-05-09 DIAGNOSIS — N390 Urinary tract infection, site not specified: Secondary | ICD-10-CM | POA: Diagnosis not present

## 2023-05-09 DIAGNOSIS — Z880 Allergy status to penicillin: Secondary | ICD-10-CM

## 2023-05-09 DIAGNOSIS — Z79899 Other long term (current) drug therapy: Secondary | ICD-10-CM

## 2023-05-09 DIAGNOSIS — E785 Hyperlipidemia, unspecified: Secondary | ICD-10-CM | POA: Diagnosis present

## 2023-05-09 DIAGNOSIS — I130 Hypertensive heart and chronic kidney disease with heart failure and stage 1 through stage 4 chronic kidney disease, or unspecified chronic kidney disease: Secondary | ICD-10-CM | POA: Diagnosis present

## 2023-05-09 DIAGNOSIS — N183 Chronic kidney disease, stage 3 unspecified: Secondary | ICD-10-CM | POA: Diagnosis present

## 2023-05-09 DIAGNOSIS — N179 Acute kidney failure, unspecified: Secondary | ICD-10-CM | POA: Diagnosis present

## 2023-05-09 DIAGNOSIS — Z888 Allergy status to other drugs, medicaments and biological substances status: Secondary | ICD-10-CM

## 2023-05-09 DIAGNOSIS — I428 Other cardiomyopathies: Secondary | ICD-10-CM | POA: Diagnosis present

## 2023-05-09 DIAGNOSIS — K2100 Gastro-esophageal reflux disease with esophagitis, without bleeding: Secondary | ICD-10-CM | POA: Diagnosis present

## 2023-05-09 DIAGNOSIS — Y846 Urinary catheterization as the cause of abnormal reaction of the patient, or of later complication, without mention of misadventure at the time of the procedure: Secondary | ICD-10-CM | POA: Diagnosis present

## 2023-05-09 DIAGNOSIS — Z7901 Long term (current) use of anticoagulants: Secondary | ICD-10-CM

## 2023-05-09 LAB — COMPREHENSIVE METABOLIC PANEL
ALT: 13 U/L (ref 0–44)
AST: 21 U/L (ref 15–41)
Albumin: 2.8 g/dL — ABNORMAL LOW (ref 3.5–5.0)
Alkaline Phosphatase: 58 U/L (ref 38–126)
Anion gap: 20 — ABNORMAL HIGH (ref 5–15)
BUN: 16 mg/dL (ref 8–23)
CO2: 25 mmol/L (ref 22–32)
Calcium: 8.6 mg/dL — ABNORMAL LOW (ref 8.9–10.3)
Chloride: 83 mmol/L — ABNORMAL LOW (ref 98–111)
Creatinine, Ser: 1.36 mg/dL — ABNORMAL HIGH (ref 0.61–1.24)
GFR, Estimated: 52 mL/min — ABNORMAL LOW (ref >60)
Glucose, Bld: 109 mg/dL — ABNORMAL HIGH (ref 70–99)
Potassium: 2.6 mmol/L — CL (ref 3.5–5.1)
Sodium: 128 mmol/L — ABNORMAL LOW (ref 135–145)
Total Bilirubin: 1.8 mg/dL — ABNORMAL HIGH (ref 0.3–1.2)
Total Protein: 6.3 g/dL — ABNORMAL LOW (ref 6.5–8.1)

## 2023-05-09 LAB — LACTIC ACID, PLASMA
Lactic Acid, Venous: 1.4 mmol/L (ref 0.5–1.9)
Lactic Acid, Venous: 1.7 mmol/L (ref 0.5–1.9)

## 2023-05-09 LAB — CBC WITH DIFFERENTIAL/PLATELET
Abs Immature Granulocytes: 0.07 10*3/uL (ref 0.00–0.07)
Basophils Absolute: 0 10*3/uL (ref 0.0–0.1)
Basophils Relative: 0 %
Eosinophils Absolute: 0.1 10*3/uL (ref 0.0–0.5)
Eosinophils Relative: 1 %
HCT: 37.1 % — ABNORMAL LOW (ref 39.0–52.0)
Hemoglobin: 11.8 g/dL — ABNORMAL LOW (ref 13.0–17.0)
Immature Granulocytes: 1 %
Lymphocytes Relative: 12 %
Lymphs Abs: 1.4 10*3/uL (ref 0.7–4.0)
MCH: 27 pg (ref 26.0–34.0)
MCHC: 31.8 g/dL (ref 30.0–36.0)
MCV: 84.9 fL (ref 80.0–100.0)
Monocytes Absolute: 1 10*3/uL (ref 0.1–1.0)
Monocytes Relative: 9 %
Neutro Abs: 8.4 10*3/uL — ABNORMAL HIGH (ref 1.7–7.7)
Neutrophils Relative %: 77 %
Platelets: 275 10*3/uL (ref 150–400)
RBC: 4.37 MIL/uL (ref 4.22–5.81)
RDW: 14.9 % (ref 11.5–15.5)
WBC: 11 10*3/uL — ABNORMAL HIGH (ref 4.0–10.5)
nRBC: 0 % (ref 0.0–0.2)

## 2023-05-09 LAB — I-STAT CG4 LACTIC ACID, ED
Lactic Acid, Venous: 2.7 mmol/L (ref 0.5–1.9)
Lactic Acid, Venous: 1.1 mmol/L (ref 0.5–1.9)

## 2023-05-09 LAB — URINALYSIS, W/ REFLEX TO CULTURE (INFECTION SUSPECTED)
Bilirubin Urine: NEGATIVE
Glucose, UA: NEGATIVE mg/dL
Ketones, ur: NEGATIVE mg/dL
Nitrite: NEGATIVE
Protein, ur: 100 mg/dL — AB
RBC / HPF: >50 RBC/hpf (ref 0–5)
Specific Gravity, Urine: 1.012 (ref 1.005–1.030)
WBC, UA: >50 WBC/hpf (ref 0–5)
pH: 5 (ref 5.0–8.0)

## 2023-05-09 LAB — I-STAT CHEM 8, ED
BUN: 18 mg/dL (ref 8–23)
Calcium, Ion: 0.79 mmol/L — CL (ref 1.15–1.40)
Chloride: 87 mmol/L — ABNORMAL LOW (ref 98–111)
Creatinine, Ser: 1.3 mg/dL — ABNORMAL HIGH (ref 0.61–1.24)
Glucose, Bld: 104 mg/dL — ABNORMAL HIGH (ref 70–99)
HCT: 37 % — ABNORMAL LOW (ref 39.0–52.0)
Hemoglobin: 12.6 g/dL — ABNORMAL LOW (ref 13.0–17.0)
Potassium: 2.8 mmol/L — ABNORMAL LOW (ref 3.5–5.1)
Sodium: 123 mmol/L — ABNORMAL LOW (ref 135–145)
TCO2: 28 mmol/L (ref 22–32)

## 2023-05-09 LAB — APTT: aPTT: 33 seconds (ref 24–36)

## 2023-05-09 LAB — PROTIME-INR
INR: 1.5 — ABNORMAL HIGH (ref 0.8–1.2)
Prothrombin Time: 18.3 seconds — ABNORMAL HIGH (ref 11.4–15.2)

## 2023-05-09 LAB — CK: Total CK: 92 U/L (ref 49–397)

## 2023-05-09 LAB — TROPONIN I (HIGH SENSITIVITY): Troponin I (High Sensitivity): 18 ng/L — ABNORMAL HIGH (ref <18)

## 2023-05-09 MED ORDER — ENOXAPARIN SODIUM 40 MG/0.4ML IJ SOSY
40.0000 mg | PREFILLED_SYRINGE | INTRAMUSCULAR | Status: DC
  Administered 2023-05-09 – 2023-05-12 (×4): 40 mg via SUBCUTANEOUS
  Filled 2023-05-09 (×4): qty 0.4

## 2023-05-09 MED ORDER — SODIUM CHLORIDE 0.9% FLUSH
3.0000 mL | Freq: Two times a day (BID) | INTRAVENOUS | Status: DC
  Administered 2023-05-09 – 2023-05-10 (×3): 3 mL via INTRAVENOUS

## 2023-05-09 MED ORDER — POTASSIUM CHLORIDE 20 MEQ PO PACK
40.0000 meq | PACK | Freq: Two times a day (BID) | ORAL | Status: DC
  Administered 2023-05-09: 40 meq via ORAL
  Filled 2023-05-09: qty 2

## 2023-05-09 MED ORDER — SODIUM CHLORIDE 0.9 % IV SOLN
2.0000 g | INTRAVENOUS | Status: DC
  Administered 2023-05-10: 2 g via INTRAVENOUS
  Filled 2023-05-09 (×2): qty 20

## 2023-05-09 MED ORDER — ACETAMINOPHEN 325 MG PO TABS
650.0000 mg | ORAL_TABLET | Freq: Four times a day (QID) | ORAL | Status: DC | PRN
  Filled 2023-05-09: qty 2

## 2023-05-09 MED ORDER — TRIMETHOBENZAMIDE HCL 100 MG/ML IM SOLN: 200.0000 mg | Freq: Four times a day (QID) | INTRAMUSCULAR | Status: DC | PRN

## 2023-05-09 MED ORDER — SODIUM CHLORIDE 0.9 % IV SOLN
INTRAVENOUS | Status: AC
  Administered 2023-05-09: 1 g via INTRAVENOUS
  Filled 2023-05-09: qty 10

## 2023-05-09 MED ORDER — MIDODRINE HCL 5 MG PO TABS
5.0000 mg | ORAL_TABLET | Freq: Three times a day (TID) | ORAL | Status: DC
  Administered 2023-05-09 – 2023-05-11 (×5): 5 mg via ORAL
  Filled 2023-05-09 (×6): qty 1

## 2023-05-09 MED ORDER — SODIUM CHLORIDE 0.9 % IV SOLN
1.0000 g | Freq: Once | INTRAVENOUS | Status: AC
  Administered 2023-05-09: 1 g via INTRAVENOUS
  Filled 2023-05-09: qty 10

## 2023-05-09 MED ORDER — POTASSIUM CHLORIDE 10 MEQ/100ML IV SOLN
10.0000 meq | Freq: Once | INTRAVENOUS | Status: AC
  Administered 2023-05-09: 10 meq via INTRAVENOUS
  Filled 2023-05-09: qty 100

## 2023-05-09 MED ORDER — LACTATED RINGERS IV BOLUS (SEPSIS)
1800.0000 mL | Freq: Once | INTRAVENOUS | Status: AC
  Administered 2023-05-09: 1800 mL via INTRAVENOUS

## 2023-05-09 MED ORDER — POTASSIUM CHLORIDE CRYS ER 20 MEQ PO TBCR: 40.0000 meq | EXTENDED_RELEASE_TABLET | Freq: Once | ORAL | Status: DC

## 2023-05-09 MED ORDER — TAMSULOSIN HCL 0.4 MG PO CAPS
0.4000 mg | ORAL_CAPSULE | Freq: Every day | ORAL | Status: DC
  Administered 2023-05-09 – 2023-05-12 (×4): 0.4 mg via ORAL
  Filled 2023-05-09 (×5): qty 1

## 2023-05-09 MED ORDER — POTASSIUM CHLORIDE 20 MEQ PO PACK
40.0000 meq | PACK | Freq: Two times a day (BID) | ORAL | Status: AC
  Administered 2023-05-09: 40 meq via ORAL
  Filled 2023-05-09: qty 2

## 2023-05-09 MED ORDER — CALCIUM CARBONATE ANTACID 500 MG PO CHEW
2.0000 | CHEWABLE_TABLET | Freq: Every evening | ORAL | Status: DC | PRN
  Administered 2023-05-09: 400 mg via ORAL
  Filled 2023-05-09: qty 2

## 2023-05-09 MED ORDER — SODIUM CHLORIDE 0.9 % IV SOLN
1.0000 g | Freq: Once | INTRAVENOUS | Status: AC
  Filled 2023-05-09: qty 10

## 2023-05-09 MED ORDER — ACETAMINOPHEN 650 MG RE SUPP: 650.0000 mg | Freq: Four times a day (QID) | RECTAL | Status: DC | PRN

## 2023-05-09 MED ORDER — MELATONIN 5 MG PO TABS
10.0000 mg | ORAL_TABLET | Freq: Every evening | ORAL | Status: DC | PRN
  Administered 2023-05-09: 10 mg via ORAL
  Filled 2023-05-09: qty 2

## 2023-05-09 MED ORDER — ALBUTEROL SULFATE (2.5 MG/3ML) 0.083% IN NEBU: 2.5000 mg | INHALATION_SOLUTION | Freq: Four times a day (QID) | RESPIRATORY_TRACT | Status: DC | PRN

## 2023-05-09 MED ORDER — SODIUM CHLORIDE 0.9 % IV BOLUS
500.0000 mL | Freq: Once | INTRAVENOUS | Status: AC
  Administered 2023-05-09: 500 mL via INTRAVENOUS

## 2023-05-09 MED ORDER — AMIODARONE HCL 200 MG PO TABS
200.0000 mg | ORAL_TABLET | Freq: Two times a day (BID) | ORAL | Status: DC
  Administered 2023-05-09 – 2023-05-13 (×7): 200 mg via ORAL
  Filled 2023-05-09 (×8): qty 1

## 2023-05-09 NOTE — ED Notes (Signed)
Date and time results received: 05/09/23 1:07 PM  (use smartphrase ".now" to insert current time)  Test: Potassium Critical Value: 2.6  Name of Provider Notified: Dr. Jearld Fenton  Orders Received? Or Actions Taken?:  Awaiting new orders

## 2023-05-09 NOTE — Significant Event (Signed)
[facility] Interventional Cardiology Note  Date: 05/09/23 Time: 1:55 PM  I was contacted by Dr. Jearld Fenton to evaluate the patient's EKGs.  He presented to the ED today after being "found down" for an indeterminate length of time.  EKG shows normal sinus rhythm with left axis deviation and left bundle branch block.  ST elevation in V3 and V4 is more pronounced than prior tracing on 03/22/2023, though it does not meet Sgarbossa criteria.  Left bundle branch block is old.  Per Dr. Guinevere Ferrari report, he did not complain of chest pain on arrival but was only alert and oriented x 1.  I recommended against emergent cardiac catheterization with possible PCI given chronic left bundle branch block and comorbidities, as risks of emergent cardiac catheterization likely outweigh benefits.  He has a history of nonischemic cardiomyopathy with an LVEF of less than 20% based on echocardiogram in May.  Labs today are also notable for sodium 123, potassium 2.8, lactate 2.7 by i-STAT chemistries.  Recommend continued workup in the ED and consultation with our general cardiology and/or heart failure team(s) if appropriate.  Yvonne Kendall, MD [facility]

## 2023-05-09 NOTE — ED Provider Notes (Signed)
EMERGENCY DEPARTMENT AT [facility] Provider Note   CSN: [id number] Arrival date & time: 05/09/23  1206     History  Chief Complaint  Patient presents with   Marquavius Scaife is a 82 y.o. male with past medical history significant for nonischemic cardiomyopathy, heart failure, diabetes, dementia, CKD who presents with concern for possible sepsis.  Was found with blood pressure of 86/48 at home by EMS, skilled nursing at home reports that they saw patient on Thursday evening, and he was found on the floor on Friday morning.  He is alert to himself but not place, situation.  Reportedly anticoagulated on Eliquis for paroxysmal A-fib although not listed in his current medications, unsure if this is up-to-date. Chronic indwelling foley cath.   HPI     Home Medications Prior to Admission medications   Medication Sig Start Date End Date Taking? Authorizing Provider  acetaminophen (TYLENOL) 325 MG tablet Take 2 tablets (650 mg total) by mouth every 4 (four) hours as needed for mild pain (or temp > 37.5 C (99.5 F)). 03/07/20   Angiulli, Mcarthur Rossetti, PA-C  albuterol (VENTOLIN HFA) 108 (90 Base) MCG/ACT inhaler Inhale 2 puffs into the lungs every 6 (six) hours as needed for wheezing or shortness of breath. 04/29/22   Janeece Agee, NP  amiodarone (PACERONE) 200 MG tablet Take 1 tablet (200 mg total) by mouth 2 (two) times daily. 03/21/23 05/06/23  Arrien, York Ram, MD  Bismuth Tribromoph-Petrolatum (XEROFORM PETROLAT PATCH 4"X4") PADS Apply 1 patch topically daily as needed. 05/06/23   [name], PA  cholecalciferol (VITAMIN D) 1000 units tablet Take 1 tablet (1,000 Units total) by mouth daily. 03/07/20   Angiulli, Mcarthur Rossetti, PA-C  clonazePAM (KLONOPIN) 0.5 MG tablet Take 1 tablet (0.5 mg total) by mouth 2 (two) times daily as needed for anxiety. 05/06/23   [name], PA  escitalopram (LEXAPRO) 20 MG tablet Take 1 tablet (20 mg total) by mouth daily. Patient  taking differently: Take 20 mg by mouth at bedtime. 10/27/22   [name], PA  esomeprazole (NEXIUM) 40 MG capsule TAKE 1 CAPSULE BY MOUTH DAILY AS NEEDED FOR HEARTBURN Patient taking differently: Take 40 mg by mouth daily before breakfast. 10/27/22   [name], PA  ezetimibe (ZETIA) 10 MG tablet Take 1 tablet (10 mg total) by mouth daily. 04/09/21   Nahser, Deloris Ping, MD  furosemide (LASIX) 40 MG tablet Take 1 tablet (40 mg total) by mouth daily. 03/21/23   Arrien, York Ram, MD  midodrine (PROAMATINE) 5 MG tablet Take 5 mg by mouth 3 (three) times daily with meals.    [provider]  olopatadine (PATANOL) 0.1 % ophthalmic solution INSTILL 1 DROP INTO BOTH EYS DAILY AS NEEDED FOR ALLERGIES 03/20/20   Janeece Agee, NP  ondansetron (ZOFRAN-ODT) 4 MG disintegrating tablet TAKE 2 TABLETS BY MOUTH AS NEEDED 12/31/22   [name], PA  OXYGEN Inhale into the lungs.    [provider]  potassium chloride SA (KLOR-CON M) 10 MEQ tablet Take 1 tablet (10 mEq total) by mouth daily. 03/21/23   Arrien, York Ram, MD  rosuvastatin (CRESTOR) 40 MG tablet Take 1 tablet (40 mg total) by mouth daily. 10/29/22   [name], PA  spironolactone (ALDACTONE) 25 MG tablet Take 1/2 tablet (12.5 mg total) by mouth daily. 03/21/23 05/06/23  Arrien, York Ram, MD  tamsulosin (FLOMAX) 0.4 MG CAPS capsule Take 1 capsule (0.4 mg total) by mouth daily after supper. 03/21/23  Arrien, York Ram, MD      Allergies    Lipitor [atorvastatin], Vytorin [ezetimibe-simvastatin], Amoxicillin, Cephalexin, Statins, and Tape    Review of Systems   Review of Systems  Reason unable to perform ROS: dementia.    Physical Exam Updated Vital Signs BP (!) 80/55 (BP Location: Left Arm)   Pulse 80   Temp (!) 97.4 F (36.3 C) (Oral)   Resp 20   SpO2 98%  Physical Exam Vitals and nursing note reviewed.  Constitutional:      General: He is not in acute distress.    Appearance: He is  ill-appearing.     Comments: Thin, chronically ill appearing  HENT:     Head: Normocephalic and atraumatic.  Eyes:     General:        Right eye: No discharge.        Left eye: No discharge.  Cardiovascular:     Rate and Rhythm: Normal rate and regular rhythm.     Heart sounds: No murmur heard.    No friction rub. No gallop.  Pulmonary:     Breath sounds: Normal breath sounds.  Abdominal:     General: Bowel sounds are normal.     Palpations: Abdomen is soft.     Comments: No significant ttp of abdomen, no distention throughout  Genitourinary:    Comments: Normal appearance of rectum, soft brown stool throughout Skin:    General: Skin is warm and dry.     Capillary Refill: Capillary refill takes less than 2 seconds.  Neurological:     Mental Status: Mental status is at baseline.     Comments: Yelling out for water, otherwise unable to follow commands, only alert and oriented to self  Psychiatric:        Mood and Affect: Mood normal.        Behavior: Behavior normal.     ED Results / Procedures / Treatments   Labs (all labs ordered are listed, but only abnormal results are displayed) Labs Reviewed  COMPREHENSIVE METABOLIC PANEL - Abnormal; Notable for the following components:      Result Value   Sodium 128 (*)    Potassium 2.6 (*)    Chloride 83 (*)    Glucose, Bld 109 (*)    Creatinine, Ser 1.36 (*)    Calcium 8.6 (*)    Total Protein 6.3 (*)    Albumin 2.8 (*)    Total Bilirubin 1.8 (*)    GFR, Estimated 52 (*)    Anion gap 20 (*)    All other components within normal limits  CBC WITH DIFFERENTIAL/PLATELET - Abnormal; Notable for the following components:   WBC 11.0 (*)    Hemoglobin 11.8 (*)    HCT 37.1 (*)    Neutro Abs 8.4 (*)    All other components within normal limits  PROTIME-INR - Abnormal; Notable for the following components:   Prothrombin Time 18.3 (*)    INR 1.5 (*)    All other components within normal limits  URINALYSIS, W/ REFLEX TO CULTURE  (INFECTION SUSPECTED) - Abnormal; Notable for the following components:   Color, Urine AMBER (*)    APPearance CLOUDY (*)    Hgb urine dipstick MODERATE (*)    Protein, ur 100 (*)    Leukocytes,Ua LARGE (*)    Bacteria, UA FEW (*)    All other components within normal limits  I-STAT CG4 LACTIC ACID, ED - Abnormal; Notable for the following components:  Lactic Acid, Venous 2.7 (*)    All other components within normal limits  I-STAT CHEM 8, ED - Abnormal; Notable for the following components:   Sodium 123 (*)    Potassium 2.8 (*)    Chloride 87 (*)    Creatinine, Ser 1.30 (*)    Glucose, Bld 104 (*)    Calcium, Ion 0.79 (*)    Hemoglobin 12.6 (*)    HCT 37.0 (*)    All other components within normal limits  CULTURE, BLOOD (ROUTINE X 2)  CULTURE, BLOOD (ROUTINE X 2)  URINE CULTURE  APTT  CK  LACTIC ACID, PLASMA  LACTIC ACID, PLASMA  I-STAT CG4 LACTIC ACID, ED  TROPONIN I (HIGH SENSITIVITY)  TROPONIN I (HIGH SENSITIVITY)    EKG EKG Interpretation Date/Time:  Saturday May 09 2023 12:23:34 EDT Ventricular Rate:  85 PR Interval:  190 QRS Duration:  186 QT Interval:  472 QTC Calculation: 562 R Axis:   -89  Text Interpretation: Sinus rhythm RBBB and LAFB Probable left ventricular hypertrophy Confirmed by Vivi Barrack [phone]) on 05/09/2023 12:38:49 PM  Radiology CT Head Wo Contrast  Result Date: 05/09/2023 CLINICAL DATA:  Head trauma, minor (Age >= 65y); Neck trauma (Age >= 65y) EXAM: CT HEAD WITHOUT CONTRAST CT CERVICAL SPINE WITHOUT CONTRAST TECHNIQUE: Multidetector CT imaging of the head and cervical spine was performed following the standard protocol without intravenous contrast. Multiplanar CT image reconstructions of the cervical spine were also generated. RADIATION DOSE REDUCTION: This exam was performed according to the departmental dose-optimization program which includes automated exposure control, adjustment of the mA and/or kV according to patient size and/or use  of iterative reconstruction technique. COMPARISON:  CT head 05/09/23 FINDINGS: CT HEAD FINDINGS Brain: Chronic right PCA territory infarct involving the right occipital lobe. Sequela of moderate chronic microvascular ischemic change. No hydrocephalus. No CT evidence of an acute cortical infarct. No hemorrhage. No extra-axial fluid collection. Vascular: No hyperdense vessel or unexpected calcification. Skull: Normal. Negative for fracture or focal lesion. Sinuses/Orbits: No middle ear or mastoid effusion. Paranasal sinuses are clear. Orbits are unremarkable. Other: None. CT CERVICAL SPINE FINDINGS Alignment: Straightening of the normal cervical lordosis. Trace anterolisthesis of C2 on C3 and C4 on C5. Mild retrolisthesis of C3 on C4. Skull base and vertebrae: No acute fracture. No primary bone lesion or focal pathologic process. Soft tissues and spinal canal: No prevertebral fluid or swelling. No visible canal hematoma. Disc levels:  No evidence of high-grade spinal canal stenosis. Upper chest: Negative. Other: None IMPRESSION: 1. No acute intracranial abnormality. Chronic right PCA territory infarct. 2. No acute fracture or traumatic malalignment of the cervical spine. Electronically Signed   By: Lorenza Cambridge M.D.   On: 05/09/2023 13:24   CT Cervical Spine Wo Contrast  Result Date: 05/09/2023 CLINICAL DATA:  Head trauma, minor (Age >= 65y); Neck trauma (Age >= 65y) EXAM: CT HEAD WITHOUT CONTRAST CT CERVICAL SPINE WITHOUT CONTRAST TECHNIQUE: Multidetector CT imaging of the head and cervical spine was performed following the standard protocol without intravenous contrast. Multiplanar CT image reconstructions of the cervical spine were also generated. RADIATION DOSE REDUCTION: This exam was performed according to the departmental dose-optimization program which includes automated exposure control, adjustment of the mA and/or kV according to patient size and/or use of iterative reconstruction technique. COMPARISON:   CT head 05/09/23 FINDINGS: CT HEAD FINDINGS Brain: Chronic right PCA territory infarct involving the right occipital lobe. Sequela of moderate chronic microvascular ischemic change. No hydrocephalus. No CT evidence of  an acute cortical infarct. No hemorrhage. No extra-axial fluid collection. Vascular: No hyperdense vessel or unexpected calcification. Skull: Normal. Negative for fracture or focal lesion. Sinuses/Orbits: No middle ear or mastoid effusion. Paranasal sinuses are clear. Orbits are unremarkable. Other: None. CT CERVICAL SPINE FINDINGS Alignment: Straightening of the normal cervical lordosis. Trace anterolisthesis of C2 on C3 and C4 on C5. Mild retrolisthesis of C3 on C4. Skull base and vertebrae: No acute fracture. No primary bone lesion or focal pathologic process. Soft tissues and spinal canal: No prevertebral fluid or swelling. No visible canal hematoma. Disc levels:  No evidence of high-grade spinal canal stenosis. Upper chest: Negative. Other: None IMPRESSION: 1. No acute intracranial abnormality. Chronic right PCA territory infarct. 2. No acute fracture or traumatic malalignment of the cervical spine. Electronically Signed   By: Lorenza Cambridge M.D.   On: 05/09/2023 13:24   DG Chest Port 1 View  Result Date: 05/09/2023 CLINICAL DATA:  Questionable sepsis - evaluate for abnormality. Fall. EXAM: PORTABLE CHEST 1 VIEW COMPARISON:  Chest radiograph 03/22/2023 FINDINGS: The cardiomediastinal silhouette is unchanged with the heart remaining mildly enlarged. Aortic atherosclerosis and an azygous lobe are noted. Lung aeration is improved compared to the prior study. Mild left basilar opacity likely reflects atelectasis. No overt pulmonary edema, sizable pleural effusion, or pneumothorax is identified. No acute osseous abnormality is seen. IMPRESSION: Mild left basilar atelectasis. Electronically Signed   By: Sebastian Ache M.D.   On: 05/09/2023 12:46    Procedures .Critical Care  Performed by: Olene Floss, PA-C Authorized by: Olene Floss, PA-C   Critical care provider statement:    Critical care time (minutes):  35   Critical care was necessary to treat or prevent imminent or life-threatening deterioration of the following conditions:  Sepsis   Critical care was time spent personally by me on the following activities:  Development of treatment plan with patient or surrogate, discussions with consultants, evaluation of patient's response to treatment, examination of patient, ordering and review of laboratory studies, ordering and review of radiographic studies, ordering and performing treatments and interventions, pulse oximetry, re-evaluation of patient's condition and review of old charts   Care discussed with: admitting provider       Medications Ordered in ED Medications  potassium chloride (KLOR-CON) packet 40 mEq (40 mEq Oral Given 05/09/23 1345)  cefTRIAXone (ROCEPHIN) 1 g in sodium chloride 0.9 % 100 mL IVPB (1 g Intravenous New Bag/Given 05/09/23 1535)  sodium chloride 0.9 % bolus 500 mL (has no administration in time range)  lactated ringers bolus 1,800 mL (0 mLs Intravenous Stopped 05/09/23 1523)  potassium chloride 10 mEq in 100 mL IVPB (0 mEq Intravenous Stopped 05/09/23 1523)    ED Course/ Medical Decision Making/ A&P Clinical Course as of 05/09/23 1553  Sat May 09, 2023  1231 Paged Dr. Okey Dupre on [facility] STEMI pager to discuss EKG [HN]  1239 D/w Dr. Okey Dupre who states would not cath patient and to f/u trops and metabolic w/u. Will re-engage with cardiology as needed. [HN]    Clinical Course User Index [HN] Loetta Rough, MD                             Medical Decision Making Amount and/or Complexity of Data Reviewed Labs: ordered. Radiology: ordered. ECG/medicine tests: ordered.  Risk Prescription drug management. Decision regarding hospitalization.   This patient is a 82 y.o. male who presents to the ED for concern  of undifferentiated sepsis vs  hypotension, this involves an extensive number of treatment options, and is a complaint that carries with it a high risk of complications and morbidity. The emergent differential diagnosis prior to evaluation includes, but is not limited to, sepsis versus shock versus hypotension, dehydration, considered acute coronary syndrome, metabolic derangement, rhabdomyolysis, UTI, pyelonephritis, versus other. This is not an exhaustive differential.   Past Medical History / Co-morbidities / Social History:  nonischemic cardiomyopathy, heart failure, diabetes, dementia, CKD   Additional history: Chart reviewed. Pertinent results include: Reviewed lab work, imaging from recent previous emergency department visits, compared current EKG to previous, concern for worsening ST changes in setting of underlying left bundle branch block  Physical Exam: Physical exam performed. The pertinent findings include:  He is ill-appearing.     Comments: Thin, chronically ill appearing   No significant ttp of abdomen, no distention throughout  Normal appearance of rectum, soft brown stool throughout   Yelling out for water, otherwise unable to follow commands, only alert and oriented to self   Appropriately placed Foley catheter with dark yellow and cloudy urine in bag   Lab Tests: I ordered, and personally interpreted labs.  The pertinent results include: UA after Foley change with large leukocytes, white blood cells, bacteria, white blood cell clumps, concerning for UTI with Foley, initial Chem-8 with significant hyponatremia, potassium, sodium 123, potassium 2.8.  True Value's on CMP sodium 128, potassium 2.6.  Anion gap 20, total bilirubin 1.8.  Concern for dehydration, hyponatremia, hypokalemia.  CBC with mild leukocytosis, white blood cells 11.0, otherwise overall unremarkable, mild anemia, hemoglobin 11.8.  Normal CK at 92.  Initial lactic acid at 2.7.   Imaging Studies: I ordered and independently interpreted  imaging studies including CT head without contrast, CT C-spine without contrast, plain film chest x-ray which shows no evidence of acute cervical spinal injury, acute intracranial abnormality, mild bibasilar atelectasis on chest x-ray. I agree with the radiologist interpretation.   Cardiac Monitoring:  The patient was maintained on a cardiac monitor.  My attending physician Dr. Jearld Fenton viewed and interpreted the cardiac monitored which showed an underlying rhythm of: compared current EKG to previous, concern for worsening ST changes in setting of underlying left bundle branch block  After consultation with Dr. Okey Dupre with cardiology, no acute STEMI falls, patient does not meet Sgarbossa criteria, we will trend tropes, metabolic workup and reengage as needed, after his lab work I think that his EKG changes are likely in the setting of his acute hypokalemia. I agree with this interpretation.   Medications: I ordered medication including oral and IV potassium for hypokalemia, 30 cc/kg fluid bolus due to concern for sepsis, hypotension on arrival, Rocephin due to concern for urinary source of infection, with confirmed UTI on UA   Consultations Obtained: I requested consultation with the hospitalist, spoke with Dr. Katrinka Blazing,  and discussed lab and imaging findings as well as pertinent plan - they recommend: Admission at this time for hypotension, possible developing urosepsis, hypokalemia, hyponatremia   Disposition: After consideration of the diagnostic results and the patients response to treatment, I feel that patient would benefit from admission for above.   I discussed this case with my attending physician Dr. Jearld Fenton who cosigned this note including patient's presenting symptoms, physical exam, and planned diagnostics and interventions. Attending physician stated agreement with plan or made changes to plan which were implemented.    Final Clinical Impression(s) / ED Diagnoses Final diagnoses:  Hypokalemia   Acute cystitis with  hematuria    Rx / DC Orders ED Discharge Orders     None         Olene Floss, PA-C 05/09/23 1553    Loetta Rough, MD 05/17/23 [phone]

## 2023-05-09 NOTE — ED Notes (Signed)
Discussed pt's hypotensive BP with Dr. Katrinka Blazing. See new orders.

## 2023-05-09 NOTE — Plan of Care (Signed)

## 2023-05-09 NOTE — ED Notes (Signed)
ED TO INPATIENT HANDOFF REPORT  ED Nurse Name and Phone #: Jen Mow [phone]  S Name/Age/Gender Christian [name] 82 y.o. male Room/Bed: 023C/023C  Code Status   Code Status: Prior  Home/SNF/Other Home Patient oriented to: self Is this baseline? Yes   Triage Complete: Triage complete  Chief Complaint Complicated UTI (urinary tract infection) [N39.0]  Triage Note Pt arrives via [facility] from home after an unwitnessed fall. Pt was seen Thursday evening by home health aide and found on the floor Friday morning. Sent out for eval. Pt has hx of dementia. Alert to person only. Pt anticoagulated on Eliquis.    Allergies Allergies  Allergen Reactions   Lipitor [Atorvastatin] Other (See Comments)    Damage to calf muscles   Vytorin [Ezetimibe-Simvastatin] Other (See Comments)    Damage to calf muscles   Amoxicillin Hives   Cephalexin Itching   Statins Other (See Comments)    Muscle pain    Tape Rash and Other (See Comments)    Broke out the skin    Level of Care/Admitting Diagnosis ED Disposition     ED Disposition  Admit   Condition  --   Comment  Hospital Area: [facility] [[id number]]  Level of Care: Telemetry Medical [104]  May admit patient to [facility] or [facility] if equivalent level of care is available:: No  Covid Evaluation: Asymptomatic - no recent exposure (last 10 days) testing not required  Diagnosis: Complicated UTI (urinary tract infection) [[id number]]  Admitting Physician: Clydie Braun [[id number]]  Attending Physician: Clydie Braun [[id number]]  Certification:: I certify this patient will need inpatient services for at least 2 midnights  Estimated Length of Stay: 2          B Medical/Surgery History Past Medical History:  Diagnosis Date   Allergic rhinitis due to pollen 03/29/2018   Chronic systolic heart failure (HCC) - EF improved to normal on Rx    Echo 11/18: Mild LVH, EF 20, diffuse HK mild AI, a sending aorta 42 mm (mildly  dilated), mild MR, mild TR, trivial PI, PASP 44 // Echo 3/19: mild LVH, EF 25-30, diff HK, Gr 1 DD, asc aorta 43 mm (mildly dilated), MAC, mild reduced RVSF // Limited echo 10/19: Mild concentric LVH, EF 60-65, grade 1 diastolic dysfunction, MAC, mild LAE    Coronary artery disease involving native coronary artery of native heart without angina pectoris 11/11/2017   R/L HC 11/18: pLAD 45, oD1 65 ; elevated PCWP (mean 29), moderate pulmonary hypertension, CO 3.66, CI 2.07   Depression with anxiety 07/08/2017   Essential hypertension 07/08/2017   GERD with esophagitis 01/11/2018   Hernia of abdominal cavity    Hyperlipidemia LDL goal <130 05/25/2017   The 10-year ASCVD risk score Denman George DC Jr., et al., 2013) is: 31.1%   Values used to calculate the score:     Age: 8 years     Sex: Male     Is Non-Hispanic African American: No     Diabetic: No     Tobacco smoker: No     Systolic Blood Pressure: 140 mmHg     Is BP treated: No     HDL Cholesterol: 68.7 mg/dL     Total Cholesterol: 258 mg/dL    Idiopathic inflammatory myopathy 05/25/2017   Neuropathy involving both lower extremities 05/25/2017   Nonischemic cardiomyopathy (HCC) 11/11/2017   LHC 09/16/17-nonobstructive CAD   Oropharyngeal dysphagia 01/11/2018   Type 2 diabetes mellitus with complication, without long-term current  use of insulin (HCC) 07/08/2017   Past Surgical History:  Procedure Laterality Date   BACK SURGERY     HERNIA REPAIR     RIGHT/LEFT HEART CATH AND CORONARY ANGIOGRAPHY N/A 09/16/2017   Procedure: RIGHT/LEFT HEART CATH AND CORONARY ANGIOGRAPHY;  Surgeon: [place], Peter M, MD;  Location[facility] INVASIVE CV LAB;  Service: Cardiovascular;  Laterality: N/A;   SPINE SURGERY       A IV Location/Drains/Wounds Patient Lines/Drains/Airways Status     Active Line/Drains/Airways     Name Placement date Placement time Site Days   Peripheral IV 05/09/23 20 G 1" Anterior;Distal;Right Forearm 05/09/23  1228  Forearm  less than 1    Urethral Catheter Billy M. RN Coude 20 Fr. 05/09/23  1304  Coude  less than 1            Intake/Output Last 24 hours No intake or output data in the 24 hours ending 05/09/23 1523  Labs/Imaging Results for orders placed or performed during the hospital encounter of 05/09/23 (from the past 48 hour(s))  Comprehensive metabolic panel     Status: Abnormal   Collection Time: 05/09/23 12:16 PM  Result Value Ref Range   Sodium 128 (L) 135 - 145 mmol/L   Potassium 2.6 (LL) 3.5 - 5.1 mmol/L    Comment: CRITICAL RESULT CALLED TO, READ BACK BY AND VERIFIED WITH W.Yaxiel Minnie RN @1304  05/09/23 E,[place]   Chloride 83 (L) 98 - 111 mmol/L   CO2 25 22 - 32 mmol/L   Glucose, Bld 109 (H) 70 - 99 mg/dL    Comment: Glucose reference range applies only to samples taken after fasting for at least 8 hours.   BUN 16 8 - 23 mg/dL   Creatinine, Ser 1.61 (H) 0.61 - 1.24 mg/dL   Calcium 8.6 (L) 8.9 - 10.3 mg/dL   Total Protein 6.3 (L) 6.5 - 8.1 g/dL   Albumin 2.8 (L) 3.5 - 5.0 g/dL   AST 21 15 - 41 U/L   ALT 13 0 - 44 U/L   Alkaline Phosphatase 58 38 - 126 U/L   Total Bilirubin 1.8 (H) 0.3 - 1.2 mg/dL   GFR, Estimated 52 (L) >60 mL/min    Comment: (NOTE) Calculated using the CKD-EPI Creatinine Equation (2021)    Anion gap 20 (H) 5 - 15    Comment: ELECTROLYTES REPEATED TO VERIFY Performed at [facility] Lab, [address] [place], [place] [address]   CBC with Differential     Status: Abnormal   Collection Time: 05/09/23 12:16 PM  Result Value Ref Range   WBC 11.0 (H) 4.0 - 10.5 K/uL   RBC 4.37 4.22 - 5.81 MIL/uL   Hemoglobin 11.8 (L) 13.0 - 17.0 g/dL   HCT 54.0 (L) 98.1 - 19.1 %   MCV 84.9 80.0 - 100.0 fL   MCH 27.0 26.0 - 34.0 pg   MCHC 31.8 30.0 - 36.0 g/dL   RDW 47.8 29.5 - 62.1 %   Platelets 275 150 - 400 K/uL   nRBC 0.0 0.0 - 0.2 %   Neutrophils Relative % 77 %   Neutro Abs 8.4 (H) 1.7 - 7.7 K/uL   Lymphocytes Relative 12 %   Lymphs Abs 1.4 0.7 - 4.0 K/uL   Monocytes Relative 9 %    Monocytes Absolute 1.0 0.1 - 1.0 K/uL   Eosinophils Relative 1 %   Eosinophils Absolute 0.1 0.0 - 0.5 K/uL   Basophils Relative 0 %   Basophils Absolute 0.0 0.0 -  0.1 K/uL   Immature Granulocytes 1 %   Abs Immature Granulocytes 0.07 0.00 - 0.07 K/uL    Comment: Performed at [facility] Lab, [address], [place], [place] [address]  Protime-INR     Status: Abnormal   Collection Time: 05/09/23 12:16 PM  Result Value Ref Range   Prothrombin Time 18.3 (H) 11.4 - 15.2 seconds   INR 1.5 (H) 0.8 - 1.2    Comment: (NOTE) INR goal varies based on device and disease states. Performed at [facility], [address], [place], [place] [address]   APTT     Status: None   Collection Time: 05/09/23 12:16 PM  Result Value Ref Range   aPTT 33 24 - 36 seconds    Comment: Performed at [facility] Lab, [address], [place], [place] [address]  CK     Status: None   Collection Time: 05/09/23 12:16 PM  Result Value Ref Range   Total CK 92 49 - 397 U/L    Comment: Performed at [facility], [address], [place], [place] [address]  I-stat chem 8, ED (not at [facility], [facility] or [facility])     Status: Abnormal   Collection Time: 05/09/23  1:05 PM  Result Value Ref Range   Sodium 123 (L) 135 - 145 mmol/L   Potassium 2.8 (L) 3.5 - 5.1 mmol/L   Chloride 87 (L) 98 - 111 mmol/L   BUN 18 8 - 23 mg/dL   Creatinine, Ser 2.95 (H) 0.61 - 1.24 mg/dL   Glucose, Bld 621 (H) 70 - 99 mg/dL    Comment: Glucose reference range applies only to samples taken after fasting for at least 8 hours.   Calcium, Ion 0.79 (LL) 1.15 - 1.40 mmol/L   TCO2 28 22 - 32 mmol/L   Hemoglobin 12.6 (L) 13.0 - 17.0 g/dL   HCT 30.8 (L) 65.7 - 84.6 %   Comment NOTIFIED PHYSICIAN   I-Stat Lactic Acid, ED     Status: Abnormal   Collection Time: 05/09/23  1:06 PM  Result Value Ref Range   Lactic Acid, Venous 2.7 (HH) 0.5 - 1.9 mmol/L   Comment NOTIFIED PHYSICIAN   Urinalysis, w/ Reflex to Culture (Infection Suspected) -Urine,  Catheterized; Indwelling urinary catheter     Status: Abnormal   Collection Time: 05/09/23  2:04 PM  Result Value Ref Range   Specimen Source URINE, CATHETERIZED    Color, Urine AMBER (A) YELLOW    Comment: BIOCHEMICALS MAY BE AFFECTED BY COLOR   APPearance CLOUDY (A) CLEAR   Specific Gravity, Urine 1.012 1.005 - 1.030   pH 5.0 5.0 - 8.0   Glucose, UA NEGATIVE NEGATIVE mg/dL   Hgb urine dipstick MODERATE (A) NEGATIVE   Bilirubin Urine NEGATIVE NEGATIVE   Ketones, ur NEGATIVE NEGATIVE mg/dL   Protein, ur 962 (A) NEGATIVE mg/dL   Nitrite NEGATIVE NEGATIVE   Leukocytes,Ua LARGE (A) NEGATIVE   RBC / HPF >50 0 - 5 RBC/hpf   WBC, UA >50 0 - 5 WBC/hpf    Comment:        Reflex urine culture not performed if WBC <=10, OR if Squamous epithelial cells >5. If Squamous epithelial cells >5 suggest recollection.    Bacteria, UA FEW (A) NONE SEEN   Squamous Epithelial / HPF 0-5 0 - 5 /HPF   WBC Clumps PRESENT    Mucus PRESENT    Hyaline Casts, UA PRESENT     Comment: Performed at  [facility] Lab, [address] [place], [place], [place] [address]   CT Head Wo Contrast  Result Date: 05/09/2023 CLINICAL DATA:  Head trauma, minor (Age >= 65y); Neck trauma (Age >= 65y) EXAM: CT HEAD WITHOUT CONTRAST CT CERVICAL SPINE WITHOUT CONTRAST TECHNIQUE: Multidetector CT imaging of the head and cervical spine was performed following the standard protocol without intravenous contrast. Multiplanar CT image reconstructions of the cervical spine were also generated. RADIATION DOSE REDUCTION: This exam was performed according to the departmental dose-optimization program which includes automated exposure control, adjustment of the mA and/or kV according to patient size and/or use of iterative reconstruction technique. COMPARISON:  CT head 05/09/23 FINDINGS: CT HEAD FINDINGS Brain: Chronic right PCA territory infarct involving the right occipital lobe. Sequela of moderate chronic microvascular ischemic change. No  hydrocephalus. No CT evidence of an acute cortical infarct. No hemorrhage. No extra-axial fluid collection. Vascular: No hyperdense vessel or unexpected calcification. Skull: Normal. Negative for fracture or focal lesion. Sinuses/Orbits: No middle ear or mastoid effusion. Paranasal sinuses are clear. Orbits are unremarkable. Other: None. CT CERVICAL SPINE FINDINGS Alignment: Straightening of the normal cervical lordosis. Trace anterolisthesis of C2 on C3 and C4 on C5. Mild retrolisthesis of C3 on C4. Skull base and vertebrae: No acute fracture. No primary bone lesion or focal pathologic process. Soft tissues and spinal canal: No prevertebral fluid or swelling. No visible canal hematoma. Disc levels:  No evidence of high-grade spinal canal stenosis. Upper chest: Negative. Other: None IMPRESSION: 1. No acute intracranial abnormality. Chronic right PCA territory infarct. 2. No acute fracture or traumatic malalignment of the cervical spine. Electronically Signed   By: Lorenza Cambridge M.D.   On: 05/09/2023 13:24   CT Cervical Spine Wo Contrast  Result Date: 05/09/2023 CLINICAL DATA:  Head trauma, minor (Age >= 65y); Neck trauma (Age >= 65y) EXAM: CT HEAD WITHOUT CONTRAST CT CERVICAL SPINE WITHOUT CONTRAST TECHNIQUE: Multidetector CT imaging of the head and cervical spine was performed following the standard protocol without intravenous contrast. Multiplanar CT image reconstructions of the cervical spine were also generated. RADIATION DOSE REDUCTION: This exam was performed according to the departmental dose-optimization program which includes automated exposure control, adjustment of the mA and/or kV according to patient size and/or use of iterative reconstruction technique. COMPARISON:  CT head 05/09/23 FINDINGS: CT HEAD FINDINGS Brain: Chronic right PCA territory infarct involving the right occipital lobe. Sequela of moderate chronic microvascular ischemic change. No hydrocephalus. No CT evidence of an acute cortical  infarct. No hemorrhage. No extra-axial fluid collection. Vascular: No hyperdense vessel or unexpected calcification. Skull: Normal. Negative for fracture or focal lesion. Sinuses/Orbits: No middle ear or mastoid effusion. Paranasal sinuses are clear. Orbits are unremarkable. Other: None. CT CERVICAL SPINE FINDINGS Alignment: Straightening of the normal cervical lordosis. Trace anterolisthesis of C2 on C3 and C4 on C5. Mild retrolisthesis of C3 on C4. Skull base and vertebrae: No acute fracture. No primary bone lesion or focal pathologic process. Soft tissues and spinal canal: No prevertebral fluid or swelling. No visible canal hematoma. Disc levels:  No evidence of high-grade spinal canal stenosis. Upper chest: Negative. Other: None IMPRESSION: 1. No acute intracranial abnormality. Chronic right PCA territory infarct. 2. No acute fracture or traumatic malalignment of the cervical spine. Electronically Signed   By: Lorenza Cambridge M.D.   On: 05/09/2023 13:24   DG Chest Port 1 View  Result Date: 05/09/2023 CLINICAL DATA:  Questionable sepsis - evaluate for abnormality. Fall. EXAM: PORTABLE CHEST 1 VIEW COMPARISON:  Chest radiograph 03/22/2023 FINDINGS: The cardiomediastinal silhouette is unchanged with the heart remaining mildly enlarged. Aortic atherosclerosis and an azygous lobe are noted. Lung aeration is improved compared to the prior study. Mild left basilar opacity likely reflects atelectasis. No overt pulmonary edema, sizable pleural effusion, or pneumothorax is identified. No acute osseous abnormality is seen. IMPRESSION: Mild left basilar atelectasis. Electronically Signed   By: Sebastian Ache M.D.   On: 05/09/2023 12:46    Pending Labs Unresulted Labs (From admission, onward)     Start     Ordered   05/09/23 1404  Urine Culture  Once,   R        05/09/23 1404   05/09/23 1230  Lactic acid, plasma  Now then every 2 hours,   R (with STAT occurrences)      05/09/23 1230   05/09/23 1223  Blood Culture  (routine x 2)  (Undifferentiated presentation (screening labs and basic nursing orders))  BLOOD CULTURE X 2,   STAT      05/09/23 1224            Vitals/Pain Today's Vitals   05/09/23 1216 05/09/23 1227 05/09/23 1330  BP: 100/70  (!) 90/59  Pulse: 73  84  Resp: 20  (!) 28  Temp: 99.1 F (37.3 C) 99.1 F (37.3 C)   TempSrc: Rectal Rectal   SpO2: 94%  98%    Isolation Precautions No active isolations  Medications Medications  potassium chloride (KLOR-CON) packet 40 mEq (40 mEq Oral Given 05/09/23 1345)  cefTRIAXone (ROCEPHIN) 1 g in sodium chloride 0.9 % 100 mL IVPB (has no administration in time range)  lactated ringers bolus 1,800 mL (0 mLs Intravenous Stopped 05/09/23 1523)  potassium chloride 10 mEq in 100 mL IVPB (0 mEq Intravenous Stopped 05/09/23 1523)    Mobility non-ambulatory     Focused Assessments Neuro Assessment Handoff:  Swallow screen pass? Yes          Neuro Assessment:   Neuro Checks:      Has TPA been given? No If patient is a Neuro Trauma and patient is going to OR before floor call report to 4N Charge nurse: [phone] or [phone]   R Recommendations: See Admitting Provider Note  Report given to:   Additional Notes:

## 2023-05-09 NOTE — H&P (Signed)
History and Physical    Patient: Christian [name] UKG:[id number] DOB: 08/11/1941 DOA: 05/09/2023 DOS: the patient was seen and examined on 05/09/2023 PCP: [name], PA  Patient coming from: Home (lives alone )via EMS  Chief Complaint:  Chief Complaint  Patient presents with   Fall   HPI: Christian [name] is a 82 y.o. male with medical history significant of hypertension, hyperlipidemia, CAD, systolic CHF, AS, CVA, pulmonary embolism, DM type II, and mild cognitive impairment who presents after fall at home.  He had just recently been hospitalized from 5/18-6/1 with acute on chronic systolic heart failure, A-fib, and acute pulmonary embolism and started on Eliquis.  Discharged home, but readmitted into the hospital on 6/2 after reporting that stocks are slippery causing him to fall.  During that hospitalization he was noted to have urinary retention and had traumatic Foley placement.  It was reported that patient refused to take Eliquis and it was discontinued off of his MAR.  Plan was for him to be discharged to a SNF with palliative care, but ultimately ended up being discharged home with home health.  History is difficult to obtain from the patient as he appears to be hard of hearing.  He reports that he was trying to get some water and fell on his back.  Home health reported seeing him last 2 days ago in the evening and then found him on the floor following morning.  In the emergency department patient was noted to be afebrile with blood pressures as low as 76/51 -100/70, and all other vital signs relatively stable.  Labs significant for W HC 11, hemoglobin 11.8, sodium 128, potassium 2.6, BUN 16, creatinine 1.36, anion gap 20, calcium 8.6, total bilirubin 1.8, CK 92, and lactic acid 2.7.  Chest x-ray done in mild left basilar atelectasis urinalysis noted moderate hemoglobin, large leukocytes, few bacteria, greater than 50 RBCs/hpf, and greater than 50 WBCs.  Blood and urine cultures had been  obtained.  Patient was bolused 1800 mL lactated Ringer's and started on empiric antibiotics of Rocephin.  Patient Foley catheter has been exchanged out in the emergency department.    Review of Systems: As mentioned in the history of present illness. All other systems reviewed and are negative. Past Medical History:  Diagnosis Date   Allergic rhinitis due to pollen 03/29/2018   Chronic systolic heart failure (HCC) - EF improved to normal on Rx    Echo 11/18: Mild LVH, EF 20, diffuse HK mild AI, a sending aorta 42 mm (mildly dilated), mild MR, mild TR, trivial PI, PASP 44 // Echo 3/19: mild LVH, EF 25-30, diff HK, Gr 1 DD, asc aorta 43 mm (mildly dilated), MAC, mild reduced RVSF // Limited echo 10/19: Mild concentric LVH, EF 60-65, grade 1 diastolic dysfunction, MAC, mild LAE    Coronary artery disease involving native coronary artery of native heart without angina pectoris 11/11/2017   R/L HC 11/18: pLAD 45, oD1 65 ; elevated PCWP (mean 29), moderate pulmonary hypertension, CO 3.66, CI 2.07   Depression with anxiety 07/08/2017   Essential hypertension 07/08/2017   GERD with esophagitis 01/11/2018   Hernia of abdominal cavity    Hyperlipidemia LDL goal <130 05/25/2017   The 10-year ASCVD risk score Denman George DC Jr., et al., 2013) is: 31.1%   Values used to calculate the score:     Age: 64 years     Sex: Male     Is Non-Hispanic African American: No     Diabetic: No  Tobacco smoker: No     Systolic Blood Pressure: 140 mmHg     Is BP treated: No     HDL Cholesterol: 68.7 mg/dL     Total Cholesterol: 258 mg/dL    Idiopathic inflammatory myopathy 05/25/2017   Neuropathy involving both lower extremities 05/25/2017   Nonischemic cardiomyopathy (HCC) 11/11/2017   LHC 09/16/17-nonobstructive CAD   Oropharyngeal dysphagia 01/11/2018   Type 2 diabetes mellitus with complication, without long-term current use of insulin (HCC) 07/08/2017   Past Surgical History:  Procedure Laterality Date   BACK SURGERY      HERNIA REPAIR     RIGHT/LEFT HEART CATH AND CORONARY ANGIOGRAPHY N/A 09/16/2017   Procedure: RIGHT/LEFT HEART CATH AND CORONARY ANGIOGRAPHY;  Surgeon: [place], Peter M, MD;  Location: [facility] INVASIVE CV LAB;  Service: Cardiovascular;  Laterality: N/A;   SPINE SURGERY     Social History:  reports that he has never smoked. He has never used smokeless tobacco. He reports that he does not drink alcohol and does not use drugs.  Allergies  Allergen Reactions   Lipitor [Atorvastatin] Other (See Comments)    Damage to calf muscles   Vytorin [Ezetimibe-Simvastatin] Other (See Comments)    Damage to calf muscles   Amoxicillin Hives   Cephalexin Itching   Statins Other (See Comments)    Muscle pain    Tape Rash and Other (See Comments)    Broke out the skin    Family History  Problem Relation Age of Onset   Stroke Mother    Kidney disease Father    Stroke Father    Hypertension Father    Early death Father    Cancer Neg Hx    Alcohol abuse Neg Hx    Diabetes Neg Hx    Heart disease Neg Hx    Hyperlipidemia Neg Hx     Prior to Admission medications   Medication Sig Start Date End Date Taking? Authorizing Provider  acetaminophen (TYLENOL) 325 MG tablet Take 2 tablets (650 mg total) by mouth every 4 (four) hours as needed for mild pain (or temp > 37.5 C (99.5 F)). 03/07/20   Angiulli, Mcarthur Rossetti, PA-C  albuterol (VENTOLIN HFA) 108 (90 Base) MCG/ACT inhaler Inhale 2 puffs into the lungs every 6 (six) hours as needed for wheezing or shortness of breath. 04/29/22   Janeece Agee, NP  amiodarone (PACERONE) 200 MG tablet Take 1 tablet (200 mg total) by mouth 2 (two) times daily. 03/21/23 05/06/23  Arrien, York Ram, MD  Bismuth Tribromoph-Petrolatum (XEROFORM PETROLAT PATCH 4"X4") PADS Apply 1 patch topically daily as needed. 05/06/23   [name], PA  cholecalciferol (VITAMIN D) 1000 units tablet Take 1 tablet (1,000 Units total) by mouth daily. 03/07/20   Angiulli, Mcarthur Rossetti, PA-C   clonazePAM (KLONOPIN) 0.5 MG tablet Take 1 tablet (0.5 mg total) by mouth 2 (two) times daily as needed for anxiety. 05/06/23   [name], PA  escitalopram (LEXAPRO) 20 MG tablet Take 1 tablet (20 mg total) by mouth daily. Patient taking differently: Take 20 mg by mouth at bedtime. 10/27/22   [name], PA  esomeprazole (NEXIUM) 40 MG capsule TAKE 1 CAPSULE BY MOUTH DAILY AS NEEDED FOR HEARTBURN Patient taking differently: Take 40 mg by mouth daily before breakfast. 10/27/22   [name], PA  ezetimibe (ZETIA) 10 MG tablet Take 1 tablet (10 mg total) by mouth daily. 04/09/21   Nahser, Deloris Ping, MD  furosemide (LASIX) 40 MG tablet Take 1 tablet (40 mg total)  by mouth daily. 03/21/23   Arrien, York Ram, MD  midodrine (PROAMATINE) 5 MG tablet Take 5 mg by mouth 3 (three) times daily with meals.    [provider]  olopatadine (PATANOL) 0.1 % ophthalmic solution INSTILL 1 DROP INTO BOTH EYS DAILY AS NEEDED FOR ALLERGIES 03/20/20   Janeece Agee, NP  ondansetron (ZOFRAN-ODT) 4 MG disintegrating tablet TAKE 2 TABLETS BY MOUTH AS NEEDED 12/31/22   [name], PA  OXYGEN Inhale into the lungs.    [provider]  potassium chloride SA (KLOR-CON M) 10 MEQ tablet Take 1 tablet (10 mEq total) by mouth daily. 03/21/23   Arrien, York Ram, MD  rosuvastatin (CRESTOR) 40 MG tablet Take 1 tablet (40 mg total) by mouth daily. 10/29/22   [name], PA  spironolactone (ALDACTONE) 25 MG tablet Take 1/2 tablet (12.5 mg total) by mouth daily. 03/21/23 05/06/23  Arrien, York Ram, MD  tamsulosin (FLOMAX) 0.4 MG CAPS capsule Take 1 capsule (0.4 mg total) by mouth daily after supper. 03/21/23   Arrien, York Ram, MD    Physical Exam: Vitals:   05/09/23 1216 05/09/23 1227 05/09/23 1330  BP: 100/70  (!) 90/59  Pulse: 73  84  Resp: 20  (!) 28  Temp: 99.1 F (37.3 C) 99.1 F (37.3 C)   TempSrc: Rectal Rectal   SpO2: 94%  98%    Constitutional: Chronically  ill-appearing elderly male no acute distress Eyes: PERRL, lids and conjunctivae normal ENMT: Mucous membranes are moist.  Hard of hearing. Neck: normal, supple Respiratory: clear to auscultation bilaterally, no wheezing, no crackles. Normal respiratory effort. No accessory muscle use.  Cardiovascular: Regular rate and rhythm, no murmurs / rubs / gallops. No extremity edema. 2+ pedal pulses. No carotid bruits.  Abdomen: no tenderness, no masses palpated. No hepatosplenomegaly. Bowel sounds positive.  Musculoskeletal: no clubbing / cyanosis. No joint deformity upper and lower extremities. Good ROM, no contractures. Normal muscle tone.  Skin: no rashes, lesions, ulcers. No induration Neurologic: CN 2-12 grossly intact. Sensation intact, DTR normal. Strength 5/5 in all 4.  Psychiatric: Normal judgment and insight. Alert and oriented x 3. Normal mood.    Data Reviewed:  EKG reveals sinus rhythm at 85 bpm with prolonged QT interval.  Reviewed labs, imaging, and pertinent records as noted above in HPI.  Assessment and Plan:  Complicated urinary tract infection Acute. Urinalysis noted moderate hemoglobin, large leukocytes, few bacteria, greater than 50 RBCs/hpf, and greater than 50 WBCs.  Foley catheter was changed in the emergency department.  Urine and blood cultures have been obtained.  Patient has been started on empiric antibiotics of Rocephin.  Noted to have urinary retention and traumatic catheterization during prior hospitalizations. -Admit to a progressive bed -Continue Foley catheter -Follow-up urine and blood cultures -Continue Rocephin  Fall at home Impaired mobility Patient presents after having a fall at home landing on his back after reportedly trying to go  get water.  CT imaging of the head and cervical spine did not note any acute abnormality. Prior hospitalization for falls. Previously recommended SNF. -Fall precautions with bed alarm -Up with assistance -PT/OT to eval -TOC  for placement   Hypotension Initial blood pressures noted to be 76/51.  Patient is on midodrine at home.  Patient had initially been bolused 1850 mL of fluid per sepsis protocol. -Goal maps greater than 65 -Continue midodrine -Bolus additional 500 due to being less than 65 after initial fluid bolus.  Leukocytosis Lactic acidosis Acute.  WBC elevated at  11 and initial lactic acid 2.7.Marland Kitchen  Thought secondary to urinary tract infection and/or hypotension -Recheck CBC in a.m.  Hypokalemia Acute.  Initial potassium noted to be as low as 2.6.  Patient has been given potassium chloride 10 mEq IV and 40 mEq p.o. -Given additional 40 mEq of potassium chloride p.o. -Recheck potassium levels in a.m.  Chronic systolic congestive heart failure Aortic stenosis Patient noted to have EF of less than 20% with aortic stenosis for which she was not considered a candidate for TAVR.  At this time no focal edema.  Chest x-ray noted left basilar atelectasis.  Patient had gotten approximately 2350 mL of fluid in the ED. -Strict I&Os and daily weights -Reassess in a.m. and determine when medically appropriate to resume diuretics  History of pulmonary embolism Patient has been diagnosed with bilateral pulmonary embolism on 5/18. No longer on anticoagulation at patient request during prior hospitalization.  Paroxsymal Atrial fibrillation Patient appears rate controlled at this time. -Continue amiodarone  Diabetes mellitus type 2 Patient's last hemoglobin A1c 7 on 6/5.  He is not on medications at baseline and previously had increased risk for hypoglycemia. -Continue to monitor blood sugars at this time without sliding scale insulin.  CKD stage 3a Stable. Cr 1.36 -Continue to monitor kidney function  Urinary retention -Continue foley -F/u with urology in outpatient setting  Mild cognitive impairment Patient appears to be alert and oriented to self and place, but not to time. -Delirium  precautions    DVT prophylaxis: Lovenox Advance Care Planning:   Code Status: DNR   Consults: Followed up Family Communication: Called  ex wife  Obryan on 4 separate attempts and  unable to leave voicemail  Severity of Illness: The appropriate patient status for this patient is INPATIENT. Inpatient status is judged to be reasonable and necessary in order to provide the required intensity of service to ensure the patient's safety. The patient's presenting symptoms, physical exam findings, and initial radiographic and laboratory data in the context of their chronic comorbidities is felt to place them at high risk for further clinical deterioration. Furthermore, it is not anticipated that the patient will be medically stable for discharge from the hospital within 2 midnights of admission.   * I certify that at the point of admission it is my clinical judgment that the patient will require inpatient hospital care spanning beyond 2 midnights from the point of admission due to high intensity of service, high risk for further deterioration and high frequency of surveillance required.*  Author: Clydie Braun, MD 05/09/2023 3:06 PM  For on call review [email or web address].

## 2023-05-09 NOTE — ED Triage Notes (Signed)
Pt arrives via [facility] from home after an unwitnessed fall. Pt was seen Thursday evening by home health aide and found on the floor Friday morning. Sent out for eval. Pt has hx of dementia. Alert to person only. Pt anticoagulated on Eliquis.

## 2023-05-10 DIAGNOSIS — Z7189 Other specified counseling: Secondary | ICD-10-CM

## 2023-05-10 DIAGNOSIS — E876 Hypokalemia: Secondary | ICD-10-CM

## 2023-05-10 DIAGNOSIS — N390 Urinary tract infection, site not specified: Secondary | ICD-10-CM | POA: Diagnosis not present

## 2023-05-10 LAB — COMPREHENSIVE METABOLIC PANEL
ALT: 7 U/L (ref 0–44)
AST: 17 U/L (ref 15–41)
Albumin: 2.1 g/dL — ABNORMAL LOW (ref 3.5–5.0)
Alkaline Phosphatase: 47 U/L (ref 38–126)
Anion gap: 16 — ABNORMAL HIGH (ref 5–15)
BUN: 11 mg/dL (ref 8–23)
CO2: 25 mmol/L (ref 22–32)
Calcium: 8 mg/dL — ABNORMAL LOW (ref 8.9–10.3)
Chloride: 88 mmol/L — ABNORMAL LOW (ref 98–111)
Creatinine, Ser: 1.1 mg/dL (ref 0.61–1.24)
GFR, Estimated: >60 mL/min (ref >60)
Glucose, Bld: 80 mg/dL (ref 70–99)
Potassium: 2.5 mmol/L — CL (ref 3.5–5.1)
Sodium: 129 mmol/L — ABNORMAL LOW (ref 135–145)
Total Bilirubin: 1.1 mg/dL (ref 0.3–1.2)
Total Protein: 4.7 g/dL — ABNORMAL LOW (ref 6.5–8.1)

## 2023-05-10 LAB — CBC
HCT: 28.1 % — ABNORMAL LOW (ref 39.0–52.0)
Hemoglobin: 9 g/dL — ABNORMAL LOW (ref 13.0–17.0)
MCH: 25.9 pg — ABNORMAL LOW (ref 26.0–34.0)
MCHC: 32 g/dL (ref 30.0–36.0)
MCV: 81 fL (ref 80.0–100.0)
Platelets: 226 10*3/uL (ref 150–400)
RBC: 3.47 MIL/uL — ABNORMAL LOW (ref 4.22–5.81)
RDW: 14.8 % (ref 11.5–15.5)
WBC: 6.7 10*3/uL (ref 4.0–10.5)
nRBC: 0 % (ref 0.0–0.2)

## 2023-05-10 LAB — BASIC METABOLIC PANEL
Anion gap: 17 — ABNORMAL HIGH (ref 5–15)
BUN: 10 mg/dL (ref 8–23)
CO2: 22 mmol/L (ref 22–32)
Calcium: 8 mg/dL — ABNORMAL LOW (ref 8.9–10.3)
Chloride: 92 mmol/L — ABNORMAL LOW (ref 98–111)
Creatinine, Ser: 1.07 mg/dL (ref 0.61–1.24)
GFR, Estimated: >60 mL/min (ref >60)
Glucose, Bld: 74 mg/dL (ref 70–99)
Potassium: 3.2 mmol/L — ABNORMAL LOW (ref 3.5–5.1)
Sodium: 131 mmol/L — ABNORMAL LOW (ref 135–145)

## 2023-05-10 LAB — TROPONIN I (HIGH SENSITIVITY): Troponin I (High Sensitivity): 17 ng/L (ref <18)

## 2023-05-10 MED ORDER — LACTATED RINGERS IV BOLUS
500.0000 mL | Freq: Once | INTRAVENOUS | Status: AC
  Administered 2023-05-10: 500 mL via INTRAVENOUS

## 2023-05-10 MED ORDER — QUETIAPINE FUMARATE 50 MG PO TABS
50.0000 mg | ORAL_TABLET | Freq: Once | ORAL | Status: AC
  Administered 2023-05-10: 50 mg via ORAL
  Filled 2023-05-10: qty 1

## 2023-05-10 MED ORDER — CHLORHEXIDINE GLUCONATE CLOTH 2 % EX PADS
6.0000 | MEDICATED_PAD | Freq: Every day | CUTANEOUS | Status: DC
  Administered 2023-05-10 – 2023-05-13 (×4): 6 via TOPICAL

## 2023-05-10 MED ORDER — POTASSIUM CHLORIDE 10 MEQ/100ML IV SOLN
10.0000 meq | INTRAVENOUS | Status: AC
  Administered 2023-05-10 (×6): 10 meq via INTRAVENOUS
  Filled 2023-05-10 (×6): qty 100

## 2023-05-10 NOTE — Progress Notes (Signed)
OT Cancellation Note  Patient Details Name: Saul Fabiano MRN: [id number] DOB: [birth year]   Cancelled Treatment:    Reason Eval/Treat Not Completed: Medical issues which prohibited therapy (Current BP 74/42 (53). Noted goal MAP >65. Will monitor for appropriateness to proceed with activity. OT to reatttempt to see pt for skilled OT eval as appropriate/available.)  Rosanne Sack "Orson Eva., OTR/L, MA Acute Rehab [phone]   Lendon Colonel 05/10/2023, 10:54 AM

## 2023-05-10 NOTE — Progress Notes (Signed)
PROGRESS NOTE  Christian [name] UEA:[id number] DOB: 1941/09/10 DOA: 05/09/2023 PCP: [name], PA  Brief History   The patient is a 82 yr old man who presents to [facility] from home via EMS after fall at home. This would be his second fall at home this week as he was found by home health on the floor 2 days ago.   The patient's recent hospitalization history includes:  03/07/2023 - 03/21/2023 with acute on chronic systolic heart failure, atrial fibrillation and acute PE. He was started on Eliquis during that stay and discharged to home.  03/22/2023 - 03/27/2023 Fall at home due to "slippery socks". Patient had a traumatic foley placement during this stay. During this stay the patient refused to take his Eliquis and it was discontinued. Although the plan had been for him to be discharged to SNF with palliative care he was discharged to home with home health.   The patient's past medical history is significant for hypertension,hyperlipidemia, CAD, systolic CHF with most recent EF of less than 20%, Aortic stenosis, CVA, PE, DM II, and mild cognitive impairment.  On this presentation the patient was found to be hypotensive in the ED. He had a sodium of 128, potassium of 2.6, BUN 16, Creatinine 1.36, Anion Gap of 20, T bili 1.8, CK of 92, and lactic acid of 2.7. CXR demonstrated mild left basilar atelectasis. UA was positive for UTI.   The patient received an 1800 cc bolus of IV fluid in the ED and started on Rocephin.  Blood culture x 2 and UCx have been collected.  This morning the patient is hypotensive into the 70's. He responds to a 500 cc bolus. He has been refusing his midodrine all morning, but agrees to take it this afternoon.   Palliative care has been reconsulted. The patient is confirmed to be DNR. Family does not want escalation of care. Should this be required they would choose comfort care.  Consultants  Palliative Care  Procedures  None  Antibiotics   Anti-infectives (From  admission, onward)    Start     Dose/Rate Route Frequency Ordered Stop   05/10/23 1400  cefTRIAXone (ROCEPHIN) 2 g in sodium chloride 0.9 % 100 mL IVPB        2 g 200 mL/hr over 30 Minutes Intravenous Every 24 hours 05/09/23 1628     05/09/23 1630  cefTRIAXone (ROCEPHIN) 1 g in sodium chloride 0.9 % 100 mL IVPB        1 g 200 mL/hr over 30 Minutes Intravenous  Once 05/09/23 1628 05/09/23 1900   05/09/23 1445  cefTRIAXone (ROCEPHIN) 1 g in sodium chloride 0.9 % 100 mL IVPB        1 g 200 mL/hr over 30 Minutes Intravenous  Once 05/09/23 1436 05/09/23 1625        Interval History/Subjective  The patient is moribund upon entering the room, but he becomes oppositional as I attempt to examine him.  Objective   Vitals:  Vitals:   05/10/23 1500 05/10/23 1600  BP: (!) 77/50 (!) 80/55  Pulse: 65 71  Resp: 14 18  Temp:  (!) 97.3 F (36.3 C)  SpO2: 96% 96%    Exam:  Constitutional:  The patient is moribund and then oppositional when awakened. He is agitated, but not in acute distress otherwise. He appears very pale, weak and frail. Respiratory:  No increased work of breathing. No wheezes, rales, or rhonchi No tactile fremitus Cardiovascular:  Regular rate and rhythm No murmurs, ectopy,  or gallups. No lateral PMI. No thrills. Abdomen:  Abdomen is soft, non-tender, non-distended No hernias, masses, or organomegaly Normoactive bowel sounds.  Musculoskeletal:  No cyanosis, clubbing, or edema Very pale. Skin:  No rashes, lesions, ulcers palpation of skin: no induration or nodules Neurologic:  Unable to evaluate as patient is unable to cooperate with exam. Psychiatric:  Unable to evaluate as patient is unable to cooperate with exam.   I have personally reviewed the following:   Today's Data   Vitals:   05/10/23 1500 05/10/23 1600  BP: (!) 77/50 (!) 80/55  Pulse: 65 71  Resp: 14 18  Temp:  (!) 97.3 F (36.3 C)  SpO2: 96% 96%     Lab Data  CBC    Component  Value Date/Time   WBC 6.7 05/10/2023 0646   RBC 3.47 (L) 05/10/2023 0646   HGB 9.0 (L) 05/10/2023 0646   HGB 14.3 05/14/2020 1529   HCT 28.1 (L) 05/10/2023 0646   HCT 42.2 05/14/2020 1529   PLT 226 05/10/2023 0646   PLT 202 02/24/2020 1013   MCV 81.0 05/10/2023 0646   MCV 89 05/14/2020 1529   MCH 25.9 (L) 05/10/2023 0646   MCHC 32.0 05/10/2023 0646   RDW 14.8 05/10/2023 0646   RDW 14.0 05/14/2020 1529   LYMPHSABS 1.4 05/09/2023 1216   LYMPHSABS 1.3 05/14/2020 1529   MONOABS 1.0 05/09/2023 1216   EOSABS 0.1 05/09/2023 1216   EOSABS 0.1 05/14/2020 1529   BASOSABS 0.0 05/09/2023 1216   BASOSABS 0.0 05/14/2020 1529      Latest Ref Rng & Units 05/10/2023    6:46 AM 05/09/2023    1:05 PM 05/09/2023   12:16 PM  BMP  Glucose 70 - 99 mg/dL 80  161  096   BUN 8 - 23 mg/dL 11  18  16    Creatinine 0.61 - 1.24 mg/dL 0.45  4.09  8.11   Sodium 135 - 145 mmol/L 129  123  128   Potassium 3.5 - 5.1 mmol/L 2.5  2.8  2.6   Chloride 98 - 111 mmol/L 88  87  83   CO2 22 - 32 mmol/L 25   25   Calcium 8.9 - 10.3 mg/dL 8.0   8.6      Micro Data   Results for orders placed or performed during the hospital encounter of 05/09/23  Blood Culture (routine x 2)     Status: None (Preliminary result)   Collection Time: 05/09/23 12:23 PM   Specimen: BLOOD RIGHT FOREARM  Result Value Ref Range Status   Specimen Description BLOOD RIGHT FOREARM  Final   Special Requests   Final    BOTTLES DRAWN AEROBIC AND ANAEROBIC Blood Culture results may not be optimal due to an inadequate volume of blood received in culture bottles   Culture   Final    NO GROWTH < 24 HOURS Performed at [facility], [address] [place], [place] [address]    Report Status PENDING  Incomplete  Urine Culture     Status: Abnormal (Preliminary result)   Collection Time: 05/09/23  2:04 PM   Specimen: Urine, Random  Result Value Ref Range Status   Specimen Description URINE, RANDOM  Final   Special Requests   Final    NONE  Reflexed from [id number] Performed at [facility], [address], [place], [place] [address]    Culture MULTIPLE SPECIES PRESENT, SUGGEST RECOLLECTION (A)  Final   Report Status PENDING  Incomplete  Blood Culture (routine x 2)     Status: None (Preliminary result)   Collection Time: 05/09/23  4:23 PM   Specimen: BLOOD LEFT ARM  Result Value Ref Range Status   Specimen Description BLOOD LEFT ARM  Final   Special Requests   Final    BOTTLES DRAWN AEROBIC AND ANAEROBIC Blood Culture adequate volume   Culture   Final    NO GROWTH < 24 HOURS Performed at [facility], [address] [place], [place] [address]    Report Status PENDING  Incomplete    Scheduled Meds:  amiodarone  200 mg Oral BID   Chlorhexidine Gluconate Cloth  6 each Topical Daily   enoxaparin (LOVENOX) injection  40 mg Subcutaneous Q24H   midodrine  5 mg Oral TID WC   sodium chloride flush  3 mL Intravenous Q12H   tamsulosin  0.4 mg Oral QPC supper   Continuous Infusions:  cefTRIAXone (ROCEPHIN)  IV 2 g (05/10/23 1444)    Principal Problem:   Complicated UTI (urinary tract infection) Active Problems:   Fall at home, subsequent encounter   Impaired mobility   Hypotension   Leukocytosis   Hypokalemia   Chronic systolic congestive heart failure (HCC)   History of pulmonary embolism   Paroxysmal atrial fibrillation (HCC)   Type 2 diabetes mellitus with hyperlipidemia (HCC)   Acute urinary retention   Chronic kidney disease, stage III (moderate) (HCC)   MCI (mild cognitive impairment)   A & P  Assessment and Plan:   Complicated urinary tract infection Acute. Urinalysis noted moderate hemoglobin, large leukocytes, few bacteria, greater than 50 RBCs/hpf, and greater than 50 WBCs.  Foley catheter was changed in the emergency department.  Urine and blood cultures have been obtained.  Patient has been started on empiric antibiotics of Rocephin.  Noted to have urinary retention and traumatic  catheterization during prior hospitalizations. -Admit to a progressive bed -Continue Foley catheter -Follow-up urine and blood cultures -Continue Rocephin -Pt is DNR/DNI with no escalation of care. Should that appear to be required, he is to be made comfort care per his ex-wife/POA as related to palliative care this morning.  Severe Sepsis: Hypotension, leukocytosis, AKI, AMS, lactic acidosis, UTI is source   Fall at home Impaired mobility Patient presents after having a fall at home landing on his back after reportedly trying to go  get water.  CT imaging of the head and cervical spine did not note any acute abnormality. Prior hospitalization for falls. Previously recommended SNF. -Fall precautions with bed alarm -Up with assistance -PT/OT to eval -TOC for placement    Hypotension Initial blood pressures noted to be 76/51.  Likely due to Patient is on midodrine at home.  Patient had initially been bolused 1850 mL of fluid per sepsis protocol. -Goal maps greater than 65 -Continue midodrine. Pt was initially refusing midodrine, but did finally take it this afternoon.  -Bolus additional 500 due to being less than 65 after initial fluid bolus. Follwed by another bolus of 500 cc for SBP in 70's on the floor. Minimal response to repeat 500 cc bolus.   -Pt is DNR/DNI with no escalation of care. Should that appear to be required, he is to be made comfort care per his ex-wife/POA as related to palliative care this morning.  Leukocytosis Lactic acidosis Acute.  WBC elevated at 11 and initial lactic acid 2.7.Marland Kitchen  Thought secondary to urinary tract infection and/or hypotension -Recheck CBC in a.m.   Hypokalemia  Acute.  Initial potassium noted to be as low as 2.6.  Patient has been given potassium chloride 10 mEq IV and 40 mEq p.o. -Given additional 40 mEq of potassium chloride p.o. -Potassium 2.5 levels in a.m. Supplemented. Repeat level ordered for this afternoon. Will need to be followed  overnight. -Pt is DNR/DNI with no escalation of care. Should that appear to be required, he is to be made comfort care per his ex-wife/POA as related to palliative care this morning.   Chronic systolic congestive heart failure Aortic stenosis Patient noted to have EF of less than 20% with aortic stenosis for which she was not considered a candidate for TAVR.  At this time no focal edema.  Chest x-ray noted left basilar atelectasis.  Patient had gotten approximately 2350 mL of fluid in the ED + an additional 500 cc this morning.  -Strict I&Os and daily weights -Reassess in a.m. and determine when medically appropriate to resume diuretics -Pt is DNR/DNI with no escalation of care. Should that appear to be required, he is to be made comfort care per his ex-wife/POA as related to palliative care this morning.   History of pulmonary embolism Patient has been diagnosed with bilateral pulmonary embolism on 5/18. No longer on anticoagulation at patient request during prior hospitalization. -Pt is DNR/DNI with no escalation of care. Should that appear to be required, he is to be made comfort care per his ex-wife/POA as related to palliative care this morning.   Paroxsymal Atrial fibrillation Patient appears rate controlled at this time. -Continue amiodarone   Diabetes mellitus type 2 Patient's last hemoglobin A1c 7 on 6/5.  He is not on medications at baseline and previously had increased risk for hypoglycemia. -Continue to monitor blood sugars at this time without sliding scale insulin.   CKD stage 3a Stable. Cr 1.36 -Continue to monitor kidney function   Urinary retention -Continue foley -F/u with urology in outpatient setting   Mild cognitive impairment Patient appears to be alert and oriented to self and place, but not to time. -Delirium precautions   DVT prophylaxis: Lovenox Advance Care Planning:   Code Status: DNR    Consults: Followed up Family Communication: Palliative care has  been in touch with patient's ex-wife and POA.    I have seen and examined this patient myself. I have spent 38 minutes in his evaluation and care.  DVT prophylaxis: Lovenox Code Status: DNR/DNI - No escalation of care. Comfort care if necessary. Family Communication: Palliative care spoke with ex-wife and POA this am.  Disposition Plan: tbd.    Glady Ouderkirk, DO [facility] Direct contact: see [email or web address]  7PM-7AM contact night coverage as above 05/10/2023, 6:18 PM  LOS: 1 day   LOS: 1 day

## 2023-05-10 NOTE — Consult Note (Signed)
Consultation Note Date: 05/10/2023   Patient Name: Christian [name]  DOB: 12-Dec-1940  MRN: [id number]  Age / Sex: 82 y.o., male  PCP: [name], [place] Referring Physician: Fran Lowes, DO  Reason for Consultation: Establishing goals of care  HPI/Patient Profile: 82 y.o. male  with past medical history of hypertension, hyperlipidemia, CAD, systolic CHF, AS, CVA, pulmonary embolism, DM type II, and mild cognitive impairment admitted on 05/09/2023 with a fall at home.   Patient was admitted for complicated UTI with lactic acidosis, hypotension, impaired mobility, hypokalemia.  He is well-known to PMT from prior hospitalizations. PMT has been consulted to assist with goals of care conversation.  Clinical Assessment and Goals of Care:  I have reviewed medical records including EPIC notes, labs and imaging, procedure for from RN, assessed the patient and then met at the bedside with patient's ex-wife/secondary HCPOA Christian [name] and friend Christian [name] to discuss diagnosis prognosis, GOC, EOL wishes, disposition and options.  I introduced Palliative Medicine as specialized medical care for people living with serious illness. It focuses on providing relief from the symptoms and stress of a serious illness. The goal is to improve quality of life for both the patient and the family.  We discussed a brief life review of the patient and then focused on their current illness.  The natural disease trajectory and expectations at EOL were discussed.  I attempted to elicit values and goals of care important to the patient.    Medical History Review and Understanding:  Provided family with update on patient's acute illness.  I shared my concerns for patient's very poor prognosis and possibility of further decline, even death during this admission.  Social History: Patient was living at home alone, with his wife living next-door frequently  and checking on him constantly.  His cousin, Christian [name], his primary HCPOA and has just returned from [place] state.  Per Christian [name], she is anticipating to go out of the country to [place] on July 31.  Functional and Nutritional State: Family reports patient was feeling well until he had his fall.  He was still having a lot of difficulty with his mobility.  Palliative Symptoms: Right arm pain, potassium infusion associated  Advance Directives: A detailed discussion regarding advanced directives was had.  Reviewed advanced directive on file.  Dr. Carlos [name] is primary HCPOA and ex-wife Christian [name] is secondary HCPOA.  Provide education to patient's ex-wife on the legal need to include primary HCPOA and decision-making process.  Christian [name] states that she is constantly keeping Christian [name] updated.  Code Status: Concepts specific to code status, artifical feeding and hydration, and rehospitalization were considered and discussed.  Reviewed MOST form on file.  Discussion: Patient's ex-wife is very concerned that he has not had a Foley change since he was at [facility].  They are very dissatisfied with the care he received there.  They do not ever want him to go back to nursing home again, as they feel he could provide 24/7 care themselves.  They are relieved to hear that this was changed in the emergency room when he arrived.  We discussed his complicated UTI and the difficulty his body is having with maintaining his blood pressure, electrolyte balance and acid balance.  Christian [name] is tearful when I share that his prognosis is poor and it is likely he is transitioning to end-of-life. Patient never was set up with outpatient palliative services.  Christian [name] understands that he is likely more appropriate for hospice services at this time.  She is still  holding onto hope.  Answer questions regarding his Eliquis and how to accomplish adequate nutrition at home.  I shared that patient had indicated he would  never want a feeding tube during prior discussions with my colleague Christian Creed NP.  Christian [name] questions this, as he never told her that and she feels it would be beneficial.  Provided education on the greater risk for harm with little to no benefit in patients who are at end-of-life.  She understands and is willing to continue discussing this based on how he does.  She is going out of town tonight, but is still reachable by phone should anything occur.  She plans to come back tomorrow.   The difference between aggressive medical intervention and comfort care was considered in light of the patient's goals of care. Hospice and Palliative Care services outpatient were explained and offered.   Discussed the importance of continued conversation with family and the medical providers regarding overall plan of care and treatment options, ensuring decisions are within the context of the patient's values and GOCs.   Questions and concerns were addressed.  Hard Choices booklet left for review. The family was encouraged to call with questions or concerns.  PMT will continue to support holistically.   SUMMARY OF RECOMMENDATIONS   -CODE STATUS updated to DNR/DNI (limited medical interventions) -Continue current care without escalation, no ICU or pressors -Allow time for outcomes, family will consider transition to comfort focused care if he does not improve in 1 to 2 days -Psychosocial and emotional support provided -PMT will continue to follow and support  Prognosis:  Guarded  Discharge Planning: To Be Determined      Primary Diagnoses: Present on Admission:  Complicated UTI (urinary tract infection)  Paroxysmal atrial fibrillation (HCC)  Type 2 diabetes mellitus with hyperlipidemia (HCC)  Acute urinary retention  History of pulmonary embolism  Impaired mobility  Leukocytosis  Hypotension  Hypokalemia  Chronic systolic congestive heart failure (HCC)  Chronic kidney disease, stage III  (moderate) (HCC)  MCI (mild cognitive impairment)    Physical Exam Vitals and nursing note reviewed.  Constitutional:      Appearance: He is ill-appearing.  Cardiovascular:     Rate and Rhythm: Normal rate.  Pulmonary:     Effort: Pulmonary effort is normal.  Neurological:     Mental Status: He is alert.  Psychiatric:        Cognition and Memory: Cognition is impaired.     Vital Signs: BP (!) 74/42 (BP Location: Left Arm)   Pulse 73   Temp (!) 97.2 F (36.2 C) (Axillary)   Resp 20   Wt 57.6 kg   SpO2 99%   BMI 20.50 kg/m  Pain Scale: PAINAD   Pain Score: 0-No pain   SpO2: SpO2: 99 % O2 Device:SpO2: 99 % O2 Flow Rate: .    Palliative Assessment/Data: 20%     MDM: High   Alasdair Kleve Jeni Salles, PA-C  Palliative Medicine Team Team phone # [phone]  Thank you for allowing the Palliative Medicine Team to assist in the care of this patient. Please utilize secure chat with additional questions, if there is no response within 30 minutes please call the above phone number.  Palliative Medicine Team providers are available by phone from 7am to 7pm daily and can be reached through the team cell phone.  Should this patient require assistance outside of these hours, please call the patient's attending physician.

## 2023-05-10 NOTE — Progress Notes (Addendum)
PT Cancellation Note  Patient Details Name: Christian [name] MRN: [id number] DOB: [birth year]   Cancelled Treatment:    Reason Eval/Treat Not Completed: Patient not medically ready  Current BP 67/45 (54). Noted goal MAP >65. Will monitor for appropriateness to proceed with activity.   1309-Addendum- Have continued to monitor BP throughout the day and MAP remains below 65 with pt lying flat. Spoke with RN and she agrees to hold PT today and will attempt 7/22.   Jerolyn Center, PT Acute Rehabilitation Services  Office [phone]  Zena Amos 05/10/2023, 8:01 AM

## 2023-05-11 DIAGNOSIS — N390 Urinary tract infection, site not specified: Secondary | ICD-10-CM | POA: Diagnosis not present

## 2023-05-11 DIAGNOSIS — W19XXXD Unspecified fall, subsequent encounter: Secondary | ICD-10-CM | POA: Diagnosis not present

## 2023-05-11 DIAGNOSIS — R627 Adult failure to thrive: Secondary | ICD-10-CM

## 2023-05-11 DIAGNOSIS — Y92009 Unspecified place in unspecified non-institutional (private) residence as the place of occurrence of the external cause: Secondary | ICD-10-CM | POA: Diagnosis not present

## 2023-05-11 LAB — CBC WITH DIFFERENTIAL/PLATELET
Abs Immature Granulocytes: 0.04 10*3/uL (ref 0.00–0.07)
Basophils Absolute: 0 10*3/uL (ref 0.0–0.1)
Basophils Relative: 1 %
Eosinophils Absolute: 0.2 10*3/uL (ref 0.0–0.5)
Eosinophils Relative: 3 %
HCT: 29.7 % — ABNORMAL LOW (ref 39.0–52.0)
Hemoglobin: 9.3 g/dL — ABNORMAL LOW (ref 13.0–17.0)
Immature Granulocytes: 1 %
Lymphocytes Relative: 11 %
Lymphs Abs: 0.6 10*3/uL — ABNORMAL LOW (ref 0.7–4.0)
MCH: 26.2 pg (ref 26.0–34.0)
MCHC: 31.3 g/dL (ref 30.0–36.0)
MCV: 83.7 fL (ref 80.0–100.0)
Monocytes Absolute: 0.5 10*3/uL (ref 0.1–1.0)
Monocytes Relative: 9 %
Neutro Abs: 4 10*3/uL (ref 1.7–7.7)
Neutrophils Relative %: 75 %
Platelets: 211 10*3/uL (ref 150–400)
RBC: 3.55 MIL/uL — ABNORMAL LOW (ref 4.22–5.81)
RDW: 15.1 % (ref 11.5–15.5)
WBC: 5.3 10*3/uL (ref 4.0–10.5)
nRBC: 0 % (ref 0.0–0.2)

## 2023-05-11 LAB — URINE CULTURE

## 2023-05-11 LAB — GLUCOSE, CAPILLARY
Glucose-Capillary: 55 mg/dL — ABNORMAL LOW (ref 70–99)
Glucose-Capillary: 68 mg/dL — ABNORMAL LOW (ref 70–99)
Glucose-Capillary: 103 mg/dL — ABNORMAL HIGH (ref 70–99)
Glucose-Capillary: 94 mg/dL (ref 70–99)
Glucose-Capillary: 84 mg/dL (ref 70–99)

## 2023-05-11 LAB — BASIC METABOLIC PANEL
Anion gap: 14 (ref 5–15)
BUN: 8 mg/dL (ref 8–23)
CO2: 24 mmol/L (ref 22–32)
Calcium: 8.1 mg/dL — ABNORMAL LOW (ref 8.9–10.3)
Chloride: 94 mmol/L — ABNORMAL LOW (ref 98–111)
Creatinine, Ser: 1.11 mg/dL (ref 0.61–1.24)
GFR, Estimated: >60 mL/min (ref >60)
Glucose, Bld: 62 mg/dL — ABNORMAL LOW (ref 70–99)
Potassium: 2.8 mmol/L — ABNORMAL LOW (ref 3.5–5.1)
Sodium: 132 mmol/L — ABNORMAL LOW (ref 135–145)

## 2023-05-11 LAB — CULTURE, BLOOD (ROUTINE X 2)

## 2023-05-11 MED ORDER — CEFDINIR 300 MG PO CAPS
300.0000 mg | ORAL_CAPSULE | Freq: Two times a day (BID) | ORAL | Status: DC
  Administered 2023-05-11 – 2023-05-13 (×4): 300 mg via ORAL
  Filled 2023-05-11 (×4): qty 1

## 2023-05-11 MED ORDER — HYDROCODONE-ACETAMINOPHEN 10-325 MG PO TABS
1.0000 | ORAL_TABLET | ORAL | Status: DC | PRN
  Administered 2023-05-11 – 2023-05-12 (×3): 1 via ORAL
  Filled 2023-05-11 (×3): qty 1

## 2023-05-11 MED ORDER — MORPHINE SULFATE (PF) 2 MG/ML IV SOLN: 1.0000 mg | INTRAVENOUS | Status: DC | PRN

## 2023-05-11 MED ORDER — GLUCOSE 40 % PO GEL
1.0000 | Freq: Once | ORAL | Status: AC | PRN
  Administered 2023-05-11: 31 g via ORAL
  Filled 2023-05-11: qty 1.21

## 2023-05-11 MED ORDER — POTASSIUM CHLORIDE CRYS ER 20 MEQ PO TBCR: 40.0000 meq | EXTENDED_RELEASE_TABLET | Freq: Four times a day (QID) | ORAL | Status: DC

## 2023-05-11 MED ORDER — POTASSIUM CHLORIDE CRYS ER 20 MEQ PO TBCR
40.0000 meq | EXTENDED_RELEASE_TABLET | Freq: Four times a day (QID) | ORAL | Status: AC
  Administered 2023-05-11 – 2023-05-12 (×3): 40 meq via ORAL
  Filled 2023-05-11 (×3): qty 2

## 2023-05-11 MED ORDER — POTASSIUM CHLORIDE 10 MEQ/100ML IV SOLN: 10.0000 meq | INTRAVENOUS | Status: DC

## 2023-05-11 MED ORDER — MIDODRINE HCL 5 MG PO TABS
10.0000 mg | ORAL_TABLET | Freq: Three times a day (TID) | ORAL | Status: DC
  Administered 2023-05-11 – 2023-05-13 (×6): 10 mg via ORAL
  Filled 2023-05-11 (×6): qty 2

## 2023-05-11 NOTE — Evaluation (Signed)
Physical Therapy Evaluation Patient Details Name: Christian [name] MRN: [id number] DOB: 09-14-1941 Today's Date: 05/11/2023  History of Present Illness  Christian [name] is a 82 y.o. male who presents 05/09/23 after fall at home. Complicated UTI, sepsis, hypotension  PMHx: R PCA CVA, mild cognitive impairment, PE, HFrEF, CAD, HTN, GERD, Hernia of abdominal cavity, HLD, neuropathy involving both lower extremities, and DM II.  Clinical Impression   Pt admitted secondary to problem above with deficits below. PTA patient was living alone with assistance from ?ex-wife and cousin. He has been falling at home. Pt currently requires +2 max assist to attempt standing (unable to come to full standing). He is restless and confused with difficulty following commands. Noted family reported to Palliative Care provider that they do not want pt to go to SNF and plan to provide 24/7 assist. Unsure that they can provide level of care that pt currently requires with current recommendation for SNF. Anticipate patient may benefit from PT to address problems listed below. Will continue to follow acutely to maximize functional mobility independence and safety.           Assistance Recommended at Discharge Frequent or constant Supervision/Assistance  If plan is discharge home, recommend the following:  Can travel by private vehicle  Two people to help with walking and/or transfers;Direct supervision/assist for medications management;Direct supervision/assist for financial management;Help with stairs or ramp for entrance   No    Equipment Recommendations BSC/3in1;Wheelchair (measurements PT);Hospital bed;Other (comment) (?hoyer lift)  Recommendations for Other Services       Functional Status Assessment Patient has had a recent decline in their functional status and demonstrates the ability to make significant improvements in function in a reasonable and predictable amount of time.     Precautions / Restrictions  Precautions Precautions: Fall Precaution Comments: h/o falls Restrictions Weight Bearing Restrictions: No      Mobility  Bed Mobility Overal bed mobility: Needs Assistance Bed Mobility: Rolling, Sidelying to Sit, Sit to Sidelying Rolling: Supervision Sidelying to sit: Max assist     Sit to sidelying: Min assist General bed mobility comments: rolling from his rt to his left on arrival; supervision for lines; ?difficulty understanding wanted him to sit up on EOB and therefore required max assist; pt wanting to return to supine and only required assist with his second leg coming up onto bed    Transfers Overall transfer level: Needs assistance Equipment used: 2 person hand held assist Transfers: Sit to/from Stand Sit to Stand: Max assist, +2 physical assistance           General transfer comment: pt attempted sit to stand numerous times with hips barely clearing the mattress and pt would stop effort and return to sit. Restless with multiple attempts    Ambulation/Gait               General Gait Details: unable  Stairs            Wheelchair Mobility     Tilt Bed    Modified Rankin (Stroke Patients Only)       Balance Overall balance assessment: Needs assistance Sitting-balance support: Bilateral upper extremity supported, Feet supported Sitting balance-Leahy Scale: Poor Sitting balance - Comments: varied minguard to total assist (when pt pushing torso backwards and nearly sliding his hips forward off the EOB)       Standing balance comment: unable  Pertinent Vitals/Pain Pain Assessment Pain Assessment: Faces Faces Pain Scale: No hurt    Home Living Family/patient expects to be discharged to:: Unsure Living Arrangements: Alone Available Help at Discharge: Family;Available PRN/intermittently Type of Home: House Home Access: Stairs to enter Entrance Stairs-Rails: Right Entrance Stairs-Number of Steps:  1   Home Layout: One level Home Equipment: Pharmacist, hospital (2 wheels) Additional Comments: information from previous admission and per pt with ?reliability    Prior Function Prior Level of Function : Needs assist;History of Falls (last six months);Patient poor historian/Family not available                     Hand Dominance   Dominant Hand: Right    Extremity/Trunk Assessment   Upper Extremity Assessment Upper Extremity Assessment: Defer to OT evaluation    Lower Extremity Assessment Lower Extremity Assessment: Generalized weakness    Cervical / Trunk Assessment Cervical / Trunk Assessment: Kyphotic  Communication   Communication: HOH  Cognition Arousal/Alertness: Awake/alert Behavior During Therapy: Restless Overall Cognitive Status: Impaired/Different from baseline Area of Impairment: Orientation, Attention, Following commands, Safety/judgement, Awareness, Problem solving                 Orientation Level: Disoriented to, Place, Time, Situation Current Attention Level: Focused, Sustained   Following Commands: Follows one step commands inconsistently, Follows one step commands with increased time Safety/Judgement: Decreased awareness of safety, Decreased awareness of deficits   Problem Solving: Slow processing, Requires verbal cues, Difficulty sequencing General Comments: able to state his name; very restless with frequent changes in position; very [facility] making assessment challenging        General Comments      Exercises     Assessment/Plan    PT Assessment Patient needs continued PT services  PT Problem List Decreased strength;Decreased activity tolerance;Decreased balance;Decreased mobility;Decreased cognition;Decreased knowledge of use of DME;Decreased safety awareness;Cardiopulmonary status limiting activity       PT Treatment Interventions DME instruction;Gait training;Stair training;Functional mobility training;Therapeutic  activities;Therapeutic exercise;Balance training;Patient/family education;Cognitive remediation    PT Goals (Current goals can be found in the Care Plan section)  Acute Rehab PT Goals Patient Stated Goal: unable to state 2/2 confusion PT Goal Formulation: Patient unable to participate in goal setting Time For Goal Achievement: 05/25/23 Potential to Achieve Goals: Fair    Frequency Min 2X/week     Co-evaluation PT/OT/SLP Co-Evaluation/Treatment: Yes Reason for Co-Treatment: Necessary to address cognition/behavior during functional activity;Complexity of the patient's impairments (multi-system involvement);For patient/therapist safety;To address functional/ADL transfers PT goals addressed during session: Mobility/safety with mobility;Balance         AM-PAC PT "6 Clicks" Mobility  Outcome Measure Help needed turning from your back to your side while in a flat bed without using bedrails?: None Help needed moving from lying on your back to sitting on the side of a flat bed without using bedrails?: A Lot Help needed moving to and from a bed to a chair (including a wheelchair)?: Total Help needed standing up from a chair using your arms (e.g., wheelchair or bedside chair)?: Total Help needed to walk in hospital room?: Total Help needed climbing 3-5 steps with a railing? : Total 6 Click Score: 10    End of Session   Activity Tolerance: Other (comment) (?orthostasis as pt requesting to return to supine and BP would not register x 2 trials) Patient left: in bed;with call bell/phone within reach;with bed alarm set Nurse Communication: Mobility status;Other (comment) (unable to get sitting BP (would not  register)) PT Visit Diagnosis: Repeated falls (R29.6);Muscle weakness (generalized) (M62.81);Difficulty in walking, not elsewhere classified (R26.2);Adult, failure to thrive (R62.7)    Time: 1124-1140 PT Time Calculation (min) (ACUTE ONLY): 16 min   Charges:   PT Evaluation $PT Eval  Low Complexity: 1 Low   PT General Charges $$ ACUTE PT VISIT: 1 Visit          Jerolyn Center, PT Acute Rehabilitation Services  Office [phone]   Zena Amos 05/11/2023, 12:24 PM

## 2023-05-11 NOTE — Evaluation (Signed)
Occupational Therapy Evaluation Patient Details Name: Christian [name] MRN: [id number] DOB: 07-17-1941 Today's Date: 05/11/2023   History of Present Illness Christian [name] is a 82 y.o. male who presents 05/09/23 after fall at home. Complicated UTI, sepsis, hypotension  PMHx: R PCA CVA, mild cognitive impairment, PE, HFrEF, CAD, HTN, GERD, Hernia of abdominal cavity, HLD, neuropathy involving both lower extremities, and DM II.   Clinical Impression   Pt presents with decline in function and safety with ADLs and ADL mobility. PTA, pt was living alone with assistance from ?ex-wife and cousin per previous chart. He has been falling at home. Pt currently requires total A with all ADLs/selfcare tasks and +2 max assist to attempt standing (unable to come to full standing). He is restless and confused with difficulty following commands. Noted family reported to Palliative Care provider that they do not want pt to go to SNF and plan to provide 24/7 assist. Unsure that they can provide level of care that pt currently requires with current recommendation for SNF. OT will follow acutely to maximize level of function and safety     Recommendations for follow up therapy are one component of a multi-disciplinary discharge planning process, led by the attending physician.  Recommendations may be updated based on patient status, additional functional criteria and insurance authorization.   Assistance Recommended at Discharge Frequent or constant Supervision/Assistance  Patient can return home with the following A lot of help with bathing/dressing/bathroom;Two people to help with walking and/or transfers;Direct supervision/assist for medications management;Assist for transportation;Help with stairs or ramp for entrance    Functional Status Assessment  Patient has had a recent decline in their functional status and demonstrates the ability to make significant improvements in function in a reasonable and predictable  amount of time.  Equipment Recommendations  Other (comment) (TBD)    Recommendations for Other Services       Precautions / Restrictions Precautions Precautions: Fall Precaution Comments: h/o falls Restrictions Weight Bearing Restrictions: No      Mobility Bed Mobility Overal bed mobility: Needs Assistance Bed Mobility: Rolling, Sidelying to Sit, Sit to Sidelying Rolling: Supervision Sidelying to sit: Max assist     Sit to sidelying: Min assist General bed mobility comments: rolling from his rt to his left on arrival; supervision for lines; ?difficulty understanding wanted him to sit up on EOB and therefore required max assist; pt wanting to return to supine and only required assist with his second leg coming up onto bed    Transfers Overall transfer level: Needs assistance Equipment used: 2 person hand held assist Transfers: Sit to/from Stand Sit to Stand: Max assist, +2 physical assistance           General transfer comment: pt attempted sit to stand numerous times with hips barely clearing the mattress and pt would stop effort and return to sit. Restless with multiple attempts      Balance Overall balance assessment: Needs assistance Sitting-balance support: Bilateral upper extremity supported, Feet supported Sitting balance-Leahy Scale: Poor Sitting balance - Comments: varied minguard to total assist (when pt pushing torso backwards and nearly sliding his hips forward off the EOB)       Standing balance comment: unable                           ADL either performed or assessed with clinical judgement   ADL Overall ADL's : Needs assistance/impaired Eating/Feeding: Total assistance   Grooming: Total assistance;Bed level Grooming Details (  indicate cue type and reason): pt declined to attempt washing face and hands, but agreeable to therapists washing his hands and face at bed level Upper Body Bathing: Total assistance   Lower Body Bathing: Total  assistance   Upper Body Dressing : Total assistance   Lower Body Dressing: Total assistance   Toilet Transfer: Total assistance       Tub/ Shower Transfer: Total assistance   Functional mobility during ADLs: Maximal assistance;+2 for physical assistance General ADL Comments: pt very HOH, difficulty following commands     Vision Patient Visual Report: No change from baseline       Perception     Praxis      Pertinent Vitals/Pain Pain Assessment Pain Assessment: No/denies pain Faces Pain Scale: No hurt     Hand Dominance Right   Extremity/Trunk Assessment Upper Extremity Assessment Upper Extremity Assessment: Generalized weakness   Lower Extremity Assessment Lower Extremity Assessment: Defer to PT evaluation   Cervical / Trunk Assessment Cervical / Trunk Assessment: Kyphotic   Communication Communication Communication: HOH   Cognition Arousal/Alertness: Awake/alert Behavior During Therapy: Restless Overall Cognitive Status: Impaired/Different from baseline Area of Impairment: Orientation, Attention, Following commands, Safety/judgement, Awareness, Problem solving                 Orientation Level: Disoriented to, Place, Time, Situation     Following Commands: Follows one step commands inconsistently, Follows one step commands with increased time Safety/Judgement: Decreased awareness of safety, Decreased awareness of deficits   Problem Solving: Slow processing, Requires verbal cues, Difficulty sequencing General Comments: able to state his name; very restless with frequent changes in position; very [facility] making assessment challenging     General Comments       Exercises     Shoulder Instructions      Home Living Family/patient expects to be discharged to:: Unsure Living Arrangements: Alone Available Help at Discharge: Family;Available PRN/intermittently Type of Home: House Home Access: Stairs to enter [employer] of Steps:  1 Entrance Stairs-Rails: Right Home Layout: One level     Bathroom Shower/Tub: Producer, television/film/video: Standard Bathroom Accessibility: Yes   Home Equipment: Pharmacist, hospital (2 wheels)   Additional Comments: information from previous admission and per pt with ?reliability      Prior Functioning/Environment Prior Level of Function : Needs assist;History of Falls (last six months);Patient poor historian/Family not available               ADLs Comments: from previous chart: independence with ADLs, ex-wife assists with IADLs including med management and transportation?        OT Problem List: Decreased strength;Impaired balance (sitting and/or standing);Decreased cognition;Decreased activity tolerance;Decreased coordination;Decreased knowledge of use of DME or AE;Decreased safety awareness      OT Treatment/Interventions: Self-care/ADL training;DME and/or AE instruction;Therapeutic activities;Balance training;Patient/family education    OT Goals(Current goals can be found in the care plan section) Acute Rehab OT Goals Patient Stated Goal: none stated OT Goal Formulation: Patient unable to participate in goal setting Time For Goal Achievement: 05/25/23 Potential to Achieve Goals: Fair ADL Goals Pt Will Perform Grooming: with max assist;with mod assist;sitting Pt Will Perform Upper Body Dressing: with max assist;with mod assist;sitting Pt Will Transfer to Toilet: with max assist;with mod assist;stand pivot transfer;bedside commode Pt Will Perform Toileting - Clothing Manipulation and hygiene: with max assist;with mod assist;sit to/from stand  OT Frequency: Min 2X/week    Co-evaluation   Reason for Co-Treatment: Necessary to address cognition/behavior during functional  activity;Complexity of the patient's impairments (multi-system involvement);For patient/therapist safety;To address functional/ADL transfers PT goals addressed during session:  Mobility/safety with mobility;Balance OT goals addressed during session: ADL's and self-care      AM-PAC OT "6 Clicks" Daily Activity     Outcome Measure Help from another person eating meals?: Total Help from another person taking care of personal grooming?: Total Help from another person toileting, which includes using toliet, bedpan, or urinal?: Total Help from another person bathing (including washing, rinsing, drying)?: Total Help from another person to put on and taking off regular upper body clothing?: Total Help from another person to put on and taking off regular lower body clothing?: Total 6 Click Score: 6   End of Session Equipment Utilized During Treatment: Gait belt  Activity Tolerance: Patient limited by fatigue;Other (comment) (confusion) Patient left: in bed;with call bell/phone within reach;with bed alarm set  OT Visit Diagnosis: Other abnormalities of gait and mobility (R26.89);Muscle weakness (generalized) (M62.81);Other symptoms and signs involving cognitive function                Time: 1124-1140 OT Time Calculation (min): 16 min Charges:  OT Evaluation $OT Eval Low Complexity: 1 Low    Galen Manila 05/11/2023, 1:18 PM

## 2023-05-11 NOTE — Plan of Care (Signed)

## 2023-05-11 NOTE — Progress Notes (Signed)
Patient ID: Christian [name], male   DOB: 1940-11-15, 82 y.o.   MRN: [id number]    Progress Note from the Palliative Medicine Team at [facility]   Patient Name: Christian [name]        Date: 05/11/2023 DOB: Oct 02, 1941  Age: 82 y.o. MRN#: [id number] Attending Physician: Starleen Arms, MD Primary Care Physician: [name], PA Admit Date: 05/09/2023    Extensive chart review has been completed prior to meeting with patient/family  including labs, vital signs, imaging, progress/consult notes, orders, medications and available advance directive documents.    82 y.o. male  with past medical history of hypertension, hyperlipidemia, CAD, systolic CHF, AS, CVA, pulmonary embolism, DM type II, and mild cognitive impairment admitted on 05/09/2023 with a fall at home.    Patient was admitted for complicated UTI with lactic acidosis, hypotension, impaired mobility, hypokalemia.  He is well-known to PMT from prior hospitalizations. PMT  consulted to assist with goals of care conversation.  Patient and family face treatment option decisions,advanced directive decisions and anticipatory care needs.   This NP assessed patient at the bedside as a follow up to  yesterday's GOCs meeting.  Spoke to his ex-wife/ HPOA [name]  Plan of care -DNR DNI -Allow time for outcomes-decisions will be made on patient outcomes, consideration of comfort focused care if patient does not improve within 1 to 2 days -Meet tomorrow with H POA at bedside    Education offered today regarding  the importance of continued conversation with family and their  medical providers regarding overall plan of care and treatment options,  ensuring decisions are within the context of the patients values and GOCs.  Questions and concerns addressed   Discussed with Dr   Time: 50 minutes   Detailed review of medical records ( labs, imaging, vital signs), medically appropriate exam ( MS, skin, resp)   discussed with  treatment team, counseling and education to patient, family, staff, documenting clinical information, medication management, coordination of care    [name] NP  Palliative Medicine Team Team Phone # [phone] Pager [phone]

## 2023-05-11 NOTE — Progress Notes (Signed)
PROGRESS NOTE    Christian [name]  GEX:[id number] DOB: 1940/11/03 DOA: 05/09/2023 PCP: [name], PA   Chief Complaint  Patient presents with   Fall    Brief Narrative:   The patient is a 82 yr old man who presents to [facility] from home via EMS after fall at home. This would be his second fall at home this week as he was found by home health on the floor 2 days ago.    The patient's recent hospitalization history includes:  03/07/2023 - 03/21/2023 with acute on chronic systolic heart failure, atrial fibrillation and acute PE. He was started on Eliquis during that stay and discharged to home.   03/22/2023 - 03/27/2023 Fall at home due to "slippery socks". Patient had a traumatic foley placement during this stay. During this stay the patient refused to take his Eliquis and it was discontinued. Although the plan had been for him to be discharged to SNF with palliative care he was discharged to home with home health.    The patient's past medical history is significant for hypertension,hyperlipidemia, CAD, systolic CHF with most recent EF of less than 20%, Aortic stenosis, CVA, PE, DM II, and mild cognitive impairment.   On this presentation the patient was found to be hypotensive in the ED. He had a sodium of 128, potassium of 2.6, BUN 16, Creatinine 1.36, Anion Gap of 20, T bili 1.8, CK of 92, and lactic acid of 2.7. CXR demonstrated mild left basilar atelectasis. UA was positive for UTI.    The patient received an 1800 cc bolus of IV fluid in the ED and started on Rocephin.   Blood culture x 2 and UCx have been collected.   This morning the patient is hypotensive into the 70's. He responds to a 500 cc bolus. He has been refusing his midodrine all morning, but agrees to take it this afternoon.    Palliative care has been reconsulted. The patient is confirmed to be DNR. Family does not want escalation of care. Should this be required they would choose comfort care.    Assessment & Plan:    Principal Problem:   Complicated UTI (urinary tract infection) Active Problems:   Fall at home, subsequent encounter   Impaired mobility   Hypotension   Leukocytosis   Hypokalemia   Chronic systolic congestive heart failure (HCC)   History of pulmonary embolism   Paroxysmal atrial fibrillation (HCC)   Type 2 diabetes mellitus with hyperlipidemia (HCC)   Acute urinary retention   Chronic kidney disease, stage III (moderate) (HCC)   MCI (mild cognitive impairment)    Severe sepsis secondary to complicated UTI due to Foley -Severe sepsis present on admission. Hypotension, leukocytosis, AKI, AMS, lactic acidosis, UTI is source -Follow on cultures. -Foley changed in ED -Continue with midodrine -Continue Rocephin -Pt is DNR/DNI with no escalation of care. Should that appear to be required, he is to be made comfort care per his ex-wife/POA as related to palliative care this morning.     Fall at home Impaired mobility -PT/OT to eval -TOC for placement  -Started on Vicodin given middle back pain secondary to fall   Hypotension -Combination secondary to sepsis and poor EF at 20%. -  cotinue with midodrine  - Will use IV fluids judicially  Hypokalemia Repleted   Chronic systolic congestive heart failure Aortic stenosis Patient noted to have EF of less than 20% with aortic stenosis for which she was not considered a candidate for TAVR.  At this  time no focal edema.  Chest x-ray noted left basilar atelectasis.  Patient had gotten approximately 2350 mL of fluid in the ED + an additional 500 cc this morning.  -Strict I&Os and daily weights -Reassess in a.m. and determine when medically appropriate to resume diuretics -Pt is DNR/DNI with no escalation of care. Should that appear to be required, he is to be made comfort care per his ex-wife/POA as related to palliative care this morning.   History of pulmonary embolism Patient has been diagnosed with bilateral pulmonary embolism on  5/18. No longer on anticoagulation at patient request during prior hospitalization. -Pt is DNR/DNI with no escalation of care. Should that appear to be required, he is to be made comfort care per his ex-wife/POA as related to palliative care this morning.   Paroxsymal Atrial fibrillation Patient appears rate controlled at this time. -Continue amiodarone   Diabetes mellitus type 2 Patient's last hemoglobin A1c 7 on 6/5.  He is not on medications at baseline and previously had increased risk for hypoglycemia. -Continue to monitor blood sugars at this time without sliding scale insulin.   CKD stage 3a Stable. Cr 1.36 -Continue to monitor kidney function   Urinary retention -Continue foley -F/u with urology in outpatient setting   Mild cognitive impairment Patient appears to be alert and oriented to self and place, but not to time. -Delirium precautions     DVT prophylaxis: Lovenox Code Status: DNR Family Communication: None at bedside Disposition:   Status is: Inpatient    Consultants:  Palliaitve   Subjective:  He is complaining of pain in the middle back at the site of fall  Objective: Vitals:   05/11/23 0500 05/11/23 0551 05/11/23 0600 05/11/23 1200  BP:  (!) 74/53 (!) 87/57   Pulse: 75 73 72   Resp: 19 12 17    Temp:  97.6 F (36.4 C) 97.7 F (36.5 C) 98 F (36.7 C)  TempSrc:  Axillary Axillary Axillary  SpO2: 99% 97% 99%   Weight: 58.1 kg       Intake/Output Summary (Last 24 hours) at 05/11/2023 1457 Last data filed at 05/11/2023 1100 Gross per 24 hour  Intake 443 ml  Output 1350 ml  Net -907 ml   Filed Weights   05/10/23 0708 05/11/23 0500  Weight: 57.6 kg 58.1 kg    Examination:  Awake Alert, Oriented X 3,frail, ill-appearing Symmetrical Chest wall movement, Good air movement bilaterally, CTAB RRR,No Gallops,Rubs or new Murmurs, No Parasternal Heave +ve B.Sounds, Abd Soft, No tenderness, No rebound - guarding or rigidity. No Cyanosis, Clubbing  , + trace edema      Data Reviewed: I have personally reviewed following labs and imaging studies  CBC: Recent Labs  Lab 05/09/23 1216 05/09/23 1305 05/10/23 0646 05/11/23 0551  WBC 11.0*  --  6.7 5.3  NEUTROABS 8.4*  --   --  4.0  HGB 11.8* 12.6* 9.0* 9.3*  HCT 37.1* 37.0* 28.1* 29.7*  MCV 84.9  --  81.0 83.7  PLT 275  --  226 211    Basic Metabolic Panel: Recent Labs  Lab 05/09/23 1216 05/09/23 1305 05/10/23 0646 05/10/23 1750 05/11/23 0551  NA 128* 123* 129* 131* 132*  K 2.6* 2.8* 2.5* 3.2* 2.8*  CL 83* 87* 88* 92* 94*  CO2 25  --  25 22 24   GLUCOSE 109* 104* 80 74 62*  BUN 16 18 11 10 8   CREATININE 1.36* 1.30* 1.10 1.07 1.11  CALCIUM 8.6*  --  8.0*  8.0* 8.1*    GFR: Estimated Creatinine Clearance: 42.2 mL/min (by C-G formula based on SCr of 1.11 mg/dL).  Liver Function Tests: Recent Labs  Lab 05/09/23 1216 05/10/23 0646  AST 21 17  ALT 13 7  ALKPHOS 58 47  BILITOT 1.8* 1.1  PROT 6.3* 4.7*  ALBUMIN 2.8* 2.1*    CBG: Recent Labs  Lab 05/11/23 1146 05/11/23 1216  GLUCAP 55* 68*     Recent Results (from the past 240 hour(s))  Blood Culture (routine x 2)     Status: None (Preliminary result)   Collection Time: 05/09/23 12:23 PM   Specimen: BLOOD RIGHT FOREARM  Result Value Ref Range Status   Specimen Description BLOOD RIGHT FOREARM  Final   Special Requests   Final    BOTTLES DRAWN AEROBIC AND ANAEROBIC Blood Culture results may not be optimal due to an inadequate volume of blood received in culture bottles   Culture   Final    NO GROWTH 2 DAYS Performed at [facility], [address], [place], [place] [address]    Report Status PENDING  Incomplete  Urine Culture     Status: Abnormal (Preliminary result)   Collection Time: 05/09/23  2:04 PM   Specimen: Urine, Random  Result Value Ref Range Status   Specimen Description URINE, RANDOM  Final   Special Requests   Final    NONE Reflexed from [id number] Performed at [facility]  Lab, [address], [place], [place] [address]    Culture MULTIPLE SPECIES PRESENT, SUGGEST RECOLLECTION (A)  Final   Report Status PENDING  Incomplete  Blood Culture (routine x 2)     Status: None (Preliminary result)   Collection Time: 05/09/23  4:23 PM   Specimen: BLOOD LEFT ARM  Result Value Ref Range Status   Specimen Description BLOOD LEFT ARM  Final   Special Requests   Final    BOTTLES DRAWN AEROBIC AND ANAEROBIC Blood Culture adequate volume   Culture   Final    NO GROWTH 2 DAYS Performed at [facility], [address], [place], [place] [address]    Report Status PENDING  Incomplete         Radiology Studies: No results found.      Scheduled Meds:  amiodarone  200 mg Oral BID   Chlorhexidine Gluconate Cloth  6 each Topical Daily   enoxaparin (LOVENOX) injection  40 mg Subcutaneous Q24H   midodrine  10 mg Oral TID WC   potassium chloride  40 mEq Oral Q6H   sodium chloride flush  3 mL Intravenous Q12H   tamsulosin  0.4 mg Oral QPC supper   Continuous Infusions:  cefTRIAXone (ROCEPHIN)  IV 2 g (05/10/23 1444)   potassium chloride       LOS: 2 days        Huey Bienenstock, MD [facility]   To contact the attending provider between 7A-7P or the covering provider during after hours 7P-7A, please log into the web site [email or web address] and access using universal [place] password for that web site. If you do not have the password, please call the hospital operator.  05/11/2023, 2:57 PM

## 2023-05-12 DIAGNOSIS — N390 Urinary tract infection, site not specified: Secondary | ICD-10-CM | POA: Diagnosis not present

## 2023-05-12 DIAGNOSIS — Z515 Encounter for palliative care: Secondary | ICD-10-CM

## 2023-05-12 LAB — CULTURE, BLOOD (ROUTINE X 2): Culture: NO GROWTH

## 2023-05-12 LAB — BASIC METABOLIC PANEL
Anion gap: 11 (ref 5–15)
BUN: 6 mg/dL — ABNORMAL LOW (ref 8–23)
CO2: 27 mmol/L (ref 22–32)
Calcium: 8.2 mg/dL — ABNORMAL LOW (ref 8.9–10.3)
Chloride: 95 mmol/L — ABNORMAL LOW (ref 98–111)
Creatinine, Ser: 1.11 mg/dL (ref 0.61–1.24)
GFR, Estimated: >60 mL/min (ref >60)
Glucose, Bld: 82 mg/dL (ref 70–99)
Potassium: 3.7 mmol/L (ref 3.5–5.1)
Sodium: 133 mmol/L — ABNORMAL LOW (ref 135–145)

## 2023-05-12 LAB — GLUCOSE, CAPILLARY
Glucose-Capillary: 110 mg/dL — ABNORMAL HIGH (ref 70–99)
Glucose-Capillary: 113 mg/dL — ABNORMAL HIGH (ref 70–99)
Glucose-Capillary: 73 mg/dL (ref 70–99)
Glucose-Capillary: 76 mg/dL (ref 70–99)

## 2023-05-12 MED ORDER — HYDROCODONE-ACETAMINOPHEN 10-325 MG PO TABS
1.0000 | ORAL_TABLET | ORAL | Status: DC | PRN
  Administered 2023-05-12 – 2023-05-13 (×3): 1 via ORAL
  Filled 2023-05-12 (×3): qty 1

## 2023-05-12 NOTE — TOC Initial Note (Signed)
Transition of Care [facility]) - Initial/Assessment Note    Patient Details  Name: Christian [name] MRN: [id number] Date of Birth: Nov 01, 1940  Transition of Care [facility]) CM/SW Contact:    Christian Sabal, RN Phone Number: 05/12/2023, 4:13 PM  Clinical Narrative:                  Christian [name] Monday with patient's H POA Christian [name] over the phone to discuss discharge needs, following Palliaive care team consult.  Christian [name] states she is interested in Physicist, medical for home hospice services.  She states that the patient already has home oxygen and a hospital bed. Patient will need PTAR for transportation home.   Referral provided to Christian [name] w [facility], she states she will follow up on this in the AM.    Expected Discharge Plan: Home w Hospice Care Barriers to Discharge: Continued Medical Work up   Patient Goals and CMS Choice Patient states their goals for this hospitalization and ongoing recovery are:: per Christian [name]- to return to home with hospice care          Expected Discharge Plan and Services   Discharge Planning Services: CM Consult Post Acute Care Choice: Hospice Living arrangements for the past 2 months: Single Family Home                                      Prior Living Arrangements/Services Living arrangements for the past 2 months: Single Family Home Lives with:: Spouse                   Activities of Daily Living      Permission Sought/Granted                  Emotional Assessment              Admission diagnosis:  Hypokalemia [E87.6] Acute cystitis with hematuria [N30.01] Complicated UTI (urinary tract infection) [N39.0] Patient Active Problem List   Diagnosis Date Noted   Complicated UTI (urinary tract infection) 05/09/2023   History of pulmonary embolism 05/09/2023   Impaired mobility 05/09/2023   Leukocytosis 05/09/2023   Hypotension 05/09/2023   Hypokalemia 05/09/2023   Chronic kidney disease, stage III (moderate) (HCC) 05/09/2023   Failure to  thrive in adult 03/25/2023   Fall at home, subsequent encounter 03/22/2023   Acute urinary retention 03/10/2023   Liver lesion 03/09/2023   Acute on chronic systolic CHF (congestive heart failure) (HCC) 03/07/2023   Cerebrovascular disease 03/07/2023   Acute pulmonary embolism (HCC) 03/07/2023   Paroxysmal atrial fibrillation (HCC) 03/07/2023   Mood disorder (HCC) 03/07/2023   GAD (generalized anxiety disorder) 04/29/2022   Stage 2 chronic kidney disease    Benign essential HTN    Chronic systolic congestive heart failure (HCC)    Acute ischemic right posterior cerebral artery (PCA) stroke (HCC) 03/01/2020   Allergic rhinitis due to pollen 03/29/2018   Oropharyngeal dysphagia 01/11/2018   GERD with esophagitis 01/11/2018   Claudication of both lower extremities (HCC) 01/06/2018   Nonischemic cardiomyopathy (HCC) 11/11/2017   Coronary artery disease involving native coronary artery of native heart without angina pectoris 11/11/2017   MCI (mild cognitive impairment) 07/08/2017   Type 2 diabetes mellitus with hyperlipidemia (HCC) 07/08/2017   Hyperlipidemia 05/25/2017   PCP:  [name], PA Pharmacy:   [facility] - [place] - [address] [place] [address] [place] [address] [place]  [place] [address] Phone: [phone] Fax: [phone]     Social Determinants of Health (SDOH) Social History: SDOH Screenings   Food Insecurity: No Food Insecurity (03/26/2023)  Housing: Low Risk  (03/26/2023)  Transportation Needs: No Transportation Needs (03/26/2023)  Utilities: Not At Risk (03/26/2023)  Alcohol Screen: Low Risk  (10/12/2019)  Depression (PHQ2-9): Medium Risk (04/29/2022)  Financial Resource Strain: Low Risk  (03/10/2023)  Tobacco Use: Low Risk  (05/06/2023)   SDOH Interventions:     Readmission Risk Interventions     No data to display

## 2023-05-12 NOTE — Care Management Important Message (Signed)
Important Message  Patient Details  Name: Christian [name] MRN: [id number] Date of Birth: 31-Mar-1941   Medicare Important Message Given:  Yes     Dorena Bodo 05/12/2023, 4:19 PM

## 2023-05-12 NOTE — Plan of Care (Signed)

## 2023-05-12 NOTE — Plan of Care (Addendum)
Patient to discharge home tomorrow with hospice care.

## 2023-05-12 NOTE — Progress Notes (Signed)
PROGRESS NOTE    Christian [name]  ZDG:[id number] DOB: 1941/07/09 DOA: 05/09/2023 PCP: [name], PA   Chief Complaint  Patient presents with   Fall    Brief Narrative:   The patient is a 82 yr old man who presents to [facility] from home via EMS after fall at home. This would be his second fall at home this week as he was found by home health on the floor 2 days ago.    The patient's recent hospitalization history includes:  03/07/2023 - 03/21/2023 with acute on chronic systolic heart failure, atrial fibrillation and acute PE. He was started on Eliquis during that stay and discharged to home.   03/22/2023 - 03/27/2023 Fall at home due to "slippery socks". Patient had a traumatic foley placement during this stay. During this stay the patient refused to take his Eliquis and it was discontinued. Although the plan had been for him to be discharged to SNF with palliative care he was discharged to home with home health.    The patient's past medical history is significant for hypertension,hyperlipidemia, CAD, systolic CHF with most recent EF of less than 20%, Aortic stenosis, CVA, PE, DM II, and mild cognitive impairment.   On this presentation the patient was found to be hypotensive in the ED. He had a sodium of 128, potassium of 2.6, BUN 16, Creatinine 1.36, Anion Gap of 20, T bili 1.8, CK of 92, and lactic acid of 2.7. CXR demonstrated mild left basilar atelectasis. UA was positive for UTI.    The patient received an 1800 cc bolus of IV fluid in the ED and started on Rocephin.   Blood culture x 2 and UCx have been collected.   This morning the patient is hypotensive into the 70's. He responds to a 500 cc bolus. He has been refusing his midodrine all morning, but agrees to take it this afternoon.    Palliative care has been reconsulted. The patient is confirmed to be DNR. Family does not want escalation of care. Should this be required they would choose comfort care.    Assessment & Plan:    Principal Problem:   Complicated UTI (urinary tract infection) Active Problems:   Fall at home, subsequent encounter   Impaired mobility   Hypotension   Leukocytosis   Hypokalemia   Chronic systolic congestive heart failure (HCC)   History of pulmonary embolism   Paroxysmal atrial fibrillation (HCC)   Type 2 diabetes mellitus with hyperlipidemia (HCC)   Acute urinary retention   Chronic kidney disease, stage III (moderate) (HCC)   MCI (mild cognitive impairment)    Severe sepsis secondary to complicated UTI due to Foley -Severe sepsis present on admission. Hypotension, leukocytosis, AKI, AMS, lactic acidosis, UTI is source -Follow on cultures. -Foley changed in ED -Continue with midodrine -Treated with IV Rocephin, lost IV access, transition to Omnicef. -Pt is DNR/DNI with no escalation of care. Should that appear to be required, he is to be made comfort care per his ex-wife/POA as related to palliative care this morning.     Fall at home Impaired mobility -PT/OT to eval -TOC for placement  -Started on Vicodin given middle back pain secondary to fall   Hypotension -This appears to be multifactorial, combination of septic shock and cardiogenic shock with known EF 20%.-At this point goals of care is palliative, and gearing towards comfort, with no aggressive measures or lines, so we will continue with midodrine and uptitrate as needed.  Hypokalemia Repleted   Chronic systolic  congestive heart failure Aortic stenosis Patient noted to have EF of less than 20% with aortic stenosis for which she was not considered a candidate for TAVR.  At this time no focal edema.  Chest x-ray noted left basilar atelectasis.  Patient had gotten approximately 2350 mL of fluid in the ED + an additional 500 cc this morning.  -Strict I&Os and daily weights -Reassess in a.m. and determine when medically appropriate to resume diuretics -Pt is DNR/DNI with no escalation of care. Should that appear  to be required, he is to be made comfort care per his ex-wife/POA as related to palliative care this morning.   History of pulmonary embolism Patient has been diagnosed with bilateral pulmonary embolism on 5/18. No longer on anticoagulation at patient request during prior hospitalization. -Pt is DNR/DNI with no escalation of care. Should that appear to be required, he is to be made comfort care per his ex-wife/POA as related to palliative care this morning.   Paroxsymal Atrial fibrillation Patient appears rate controlled at this time. -Continue amiodarone   Diabetes mellitus type 2 Patient's last hemoglobin A1c 7 on 6/5.  He is not on medications at baseline and previously had increased risk for hypoglycemia. -Continue to monitor blood sugars at this time without sliding scale insulin.   CKD stage 3a Stable. Cr 1.36 -Continue to monitor kidney function   Urinary retention -Continue foley -F/u with urology in outpatient setting   Mild cognitive impairment Patient appears to be alert and oriented to self and place, but not to time. -Delirium precautions     DVT prophylaxis: Lovenox Code Status: DNR Family Communication: None at bedside Disposition:   Status is: Inpatient    Consultants:  Palliaitve   Subjective:  No significant events overnight as discussed with staff, patient reports of discomfort, and back pain from fall  Objective: Vitals:   05/12/23 0435 05/12/23 0446 05/12/23 0809 05/12/23 1047  BP: (!) 95/56   94/62  Pulse: 86     Resp: 18  10   Temp: 97.9 F (36.6 C)     TempSrc: Axillary     SpO2: 100%     Weight:  56.4 kg      Intake/Output Summary (Last 24 hours) at 05/12/2023 1335 Last data filed at 05/12/2023 1049 Gross per 24 hour  Intake 530 ml  Output 525 ml  Net 5 ml   Filed Weights   05/10/23 0708 05/11/23 0500 05/12/23 0446  Weight: 57.6 kg 58.1 kg 56.4 kg    Examination:  Awake Alert, Oriented X 3,frail, ill-appearing, extremely  hard of hearing Symmetrical Chest wall movement, Good air movement bilaterally RRR,No Gallops,Rubs or new Murmurs, No Parasternal Heave +ve B.Sounds, Abd Soft, No tenderness, No rebound - guarding or rigidity. No Cyanosis, Clubbing, trace  edema, No new Rash or bruise        Data Reviewed: I have personally reviewed following labs and imaging studies  CBC: Recent Labs  Lab 05/09/23 1216 05/09/23 1305 05/10/23 0646 05/11/23 0551  WBC 11.0*  --  6.7 5.3  NEUTROABS 8.4*  --   --  4.0  HGB 11.8* 12.6* 9.0* 9.3*  HCT 37.1* 37.0* 28.1* 29.7*  MCV 84.9  --  81.0 83.7  PLT 275  --  226 211    Basic Metabolic Panel: Recent Labs  Lab 05/09/23 1216 05/09/23 1305 05/10/23 0646 05/10/23 1750 05/11/23 0551 05/12/23 0905  NA 128* 123* 129* 131* 132* 133*  K 2.6* 2.8* 2.5* 3.2* 2.8* 3.7  CL 83* 87* 88* 92* 94* 95*  CO2 25  --  25 22 24 27   GLUCOSE 109* 104* 80 74 62* 82  BUN 16 18 11 10 8  6*  CREATININE 1.36* 1.30* 1.10 1.07 1.11 1.11  CALCIUM 8.6*  --  8.0* 8.0* 8.1* 8.2*    GFR: Estimated Creatinine Clearance: 40.9 mL/min (by C-G formula based on SCr of 1.11 mg/dL).  Liver Function Tests: Recent Labs  Lab 05/09/23 1216 05/10/23 0646  AST 21 17  ALT 13 7  ALKPHOS 58 47  BILITOT 1.8* 1.1  PROT 6.3* 4.7*  ALBUMIN 2.8* 2.1*    CBG: Recent Labs  Lab 05/11/23 1637 05/11/23 1647 05/11/23 2126 05/12/23 0812 05/12/23 1109  GLUCAP 94 103* 84 76 113*     Recent Results (from the past 240 hour(s))  Blood Culture (routine x 2)     Status: None (Preliminary result)   Collection Time: 05/09/23 12:23 PM   Specimen: BLOOD RIGHT FOREARM  Result Value Ref Range Status   Specimen Description BLOOD RIGHT FOREARM  Final   Special Requests   Final    BOTTLES DRAWN AEROBIC AND ANAEROBIC Blood Culture results may not be optimal due to an inadequate volume of blood received in culture bottles   Culture   Final    NO GROWTH 3 DAYS Performed at [facility], [address] [place], [place] [address]    Report Status PENDING  Incomplete  Urine Culture     Status: Abnormal   Collection Time: 05/09/23  2:04 PM   Specimen: Urine, Random  Result Value Ref Range Status   Specimen Description URINE, RANDOM  Final   Special Requests   Final    NONE Reflexed from [id number] Performed at [facility], [address] [place], [place] [address]    Culture MULTIPLE SPECIES PRESENT, SUGGEST RECOLLECTION (A)  Final   Report Status 05/11/2023 FINAL  Final  Blood Culture (routine x 2)     Status: None (Preliminary result)   Collection Time: 05/09/23  4:23 PM   Specimen: BLOOD LEFT ARM  Result Value Ref Range Status   Specimen Description BLOOD LEFT ARM  Final   Special Requests   Final    BOTTLES DRAWN AEROBIC AND ANAEROBIC Blood Culture adequate volume   Culture   Final    NO GROWTH 3 DAYS Performed at [facility], [address] [place], [place] [address]    Report Status PENDING  Incomplete         Radiology Studies: No results found.      Scheduled Meds:  amiodarone  200 mg Oral BID   cefdinir  300 mg Oral Q12H   Chlorhexidine Gluconate Cloth  6 each Topical Daily   enoxaparin (LOVENOX) injection  40 mg Subcutaneous Q24H   midodrine  10 mg Oral TID WC   sodium chloride flush  3 mL Intravenous Q12H   tamsulosin  0.4 mg Oral QPC supper   Continuous Infusions:  cefTRIAXone (ROCEPHIN)  IV 2 g (05/10/23 1444)     LOS: 3 days        Huey Bienenstock, MD [facility]   To contact the attending provider between 7A-7P or the covering provider during after hours 7P-7A, please log into the web site [email or web address] and access using universal [place] password for that web site. If you do not have the password, please call the hospital operator.  05/12/2023, 1:35 PM

## 2023-05-12 NOTE — Progress Notes (Signed)
Patient ID: Skylor Schnapp, male   DOB: Mar 18, 1941, 82 y.o.   MRN: [id number]    Progress Note from the Palliative Medicine Team at [facility]   Patient Name: Deshan Hemmelgarn        Date: 05/12/2023 DOB: 09-Feb-1941  Age: 82 y.o. MRN#: [id number] Attending Physician: Starleen Arms, MD Primary Care Physician: [name], PA Admit Date: 05/09/2023    Extensive chart review has been completed prior to meeting with patient/family  including labs, vital signs, imaging, progress/consult notes, orders, medications and available advance directive documents.    82 y.o. male  with past medical history of hypertension, hyperlipidemia, CAD, systolic CHF, AS, CVA, pulmonary embolism, DM type II, and mild cognitive impairment admitted on 05/09/2023 with a fall at home.    Patient was admitted for complicated UTI with lactic acidosis, hypotension, impaired mobility, hypokalemia.  He is well-known to PMT from prior hospitalizations. PMT  consulted to assist with goals of care conversation.  Patient and family face treatment option decisions,advanced directive decisions and anticipatory care needs.   This NP assessed patient at the bedside as a follow up for palliative medicine needs and emotional support and to meet as scheduled with H POA for continued conversation regarding next steps in treatment plan.[name] Disbro/H POA and Vita Barley at bedside.  Occasional from patient's current medical status situation specific to his chronic comorbidities; CHF, CVA, AS, frailty and immobility and overall failure to thrive  Education offered on the difference between a full medical support path attempting to prolong life versus a palliative/supportive path focusing on comfort and dignity allowing for natural death.  Education offered on hospice benefit; philosophy and eligibility.  H POA understanding patient's wishes next decision to shift to a full comfort path  once patient is discharged home  from the hospital  MOST form completed to reflect full comfort   Plan of care--once patient is discharged home from the hospital with hospice services in place -DNR DNI -Avoid rehospitalization -No artificial feeding or hydration now or in the future -Minimize medications down to those that enhance comfort only once patient is home/will discuss with hospice at that time -Symptom management specific to generalized pain         -Norco tablet 10-325 mg 1 tablet p.o. every 3 hours as needed (Convert to Roxanol once patient is home under hospice services) -Transition home with comfort being the main focus of care, family anticipate discharge in the morning  Prognosis is likely less than a few months  Questions and concerns addressed   Discussed with Dr Randol Kern  and bedside RN    Time: 65 minutes   Detailed review of medical records ( labs, imaging, vital signs), medically appropriate exam ( MS, skin, resp)   discussed with treatment team, counseling and education to patient, family, staff, documenting clinical information, medication management, coordination of care    [name] NP  Palliative Medicine Team Team Phone # [phone] Pager [phone]

## 2023-05-12 NOTE — Progress Notes (Signed)
Patient will not leave leads or IV in place. Patient confused and constantly pulling everything off. Mittens make patient worse. MD made aware.

## 2023-05-12 NOTE — Progress Notes (Signed)
Patient remains confused and pulolng things off constantly. Patient will not leave mepiplex dressings in place leads wire and trying to get out of bed. Patient refuses to eat except a bite of food here and there. MD aware. Palliative to meet with MD and patient's wife today.

## 2023-05-13 ENCOUNTER — Telehealth: Payer: Self-pay | Admitting: Physician Assistant

## 2023-05-13 ENCOUNTER — Other Ambulatory Visit (HOSPITAL_COMMUNITY): Payer: Self-pay

## 2023-05-13 DIAGNOSIS — Z515 Encounter for palliative care: Secondary | ICD-10-CM

## 2023-05-13 DIAGNOSIS — N390 Urinary tract infection, site not specified: Secondary | ICD-10-CM | POA: Diagnosis not present

## 2023-05-13 LAB — GLUCOSE, CAPILLARY
Glucose-Capillary: 59 mg/dL — ABNORMAL LOW (ref 70–99)
Glucose-Capillary: 76 mg/dL (ref 70–99)

## 2023-05-13 LAB — CULTURE, BLOOD (ROUTINE X 2): Special Requests: ADEQUATE

## 2023-05-13 MED ORDER — MORPHINE SULFATE (CONCENTRATE) 10 MG/0.5ML PO SOLN
2.5000 mg | ORAL | Status: DC | PRN
  Administered 2023-05-13: 2.6 mg via ORAL
  Filled 2023-05-13: qty 0.5

## 2023-05-13 MED ORDER — MIDODRINE HCL 10 MG PO TABS
10.0000 mg | ORAL_TABLET | Freq: Three times a day (TID) | ORAL | 0 refills | Status: DC
  Filled 2023-05-13: qty 90, 30d supply, fill #0

## 2023-05-13 MED ORDER — CEFDINIR 300 MG PO CAPS
300.0000 mg | ORAL_CAPSULE | Freq: Two times a day (BID) | ORAL | 0 refills | Status: DC
  Filled 2023-05-13: qty 6, 3d supply, fill #0

## 2023-05-13 MED ORDER — MORPHINE SULFATE 10 MG/5ML PO SOLN
2.5000 mg | ORAL | 0 refills | Status: DC | PRN
  Filled 2023-05-13: qty 40, 5d supply, fill #0

## 2023-05-13 MED ORDER — FUROSEMIDE 40 MG PO TABS: 40.0000 mg | ORAL_TABLET | Freq: Every day | ORAL | Status: DC | PRN

## 2023-05-13 MED ORDER — MORPHINE SULFATE 20 MG/5ML PO SOLN
2.5000 mg | ORAL | 0 refills | Status: DC | PRN
  Filled 2023-05-13: qty 20, 6d supply, fill #0

## 2023-05-13 NOTE — Discharge Instructions (Signed)
Management per hospice at home.

## 2023-05-13 NOTE — Progress Notes (Signed)
OT Cancellation Note  Patient Details Name: Christian [name] MRN: [id number] DOB: 05/29/1941   Cancelled Treatment:    Reason Eval/Treat Not Completed: Other (comment). Pt to d/c home today on hospice per RN and [facility] hospital liaison notes  Galen Manila 05/13/2023, 10:59 AM

## 2023-05-13 NOTE — Progress Notes (Signed)
[employer] [facility]) Hospital Liaison Note  Referral received for patient/family interest in hospice at home. [facility] liaison spoke with patient's HCPOA [name] to confirm interest. Interest confirmed.  Plan is to discharge home today.   No DME needed at this time.   Please send comfort medications with patient at discharge.   Please call with any question or concerns.   Lynder Parents [facility] Liaison [phone]

## 2023-05-13 NOTE — Telephone Encounter (Signed)
Caller is Sherri from [employer], referral intake. Caller states patient's family is requesting PCP to be attending of records for patient. Caller states if PCP would like to do so then she would need to know if pcp wished to refer comfort orders back to the hospice physician. Sherri can be reached at [phone] for a response.

## 2023-05-13 NOTE — Plan of Care (Signed)
Patient discharging home with Hospice

## 2023-05-13 NOTE — TOC Transition Note (Signed)
Transition of Care [facility]) - CM/SW Discharge Note   Patient Details  Name: Christian [name] MRN: [id number] Date of Birth: 1941-06-16  Transition of Care [facility]) CM/SW Contact:  Lawerance Sabal, RN Phone Number: 05/13/2023, 11:34 AM   Clinical Narrative:     Patient will DC to home w Home Hospice through [facility] today. Spoke w [name] POA and she would like him home around 5pm, address verified.  I have called [facility] and added him to the list at 4pm. Nurse is to call [name] [phone] when [facility] arrives so she is home to unlock house. I have instructed the bedside on nurse.  Nursing and [facility] updated on [facility] time.  [facility] forms and DNR are on chart.   Final next level of care: Home w Hospice Care Barriers to Discharge: No Barriers Identified   Patient Goals and CMS Choice      Discharge Placement                         Discharge Plan and Services Additional resources added to the After Visit Summary for     Discharge Planning Services: CM Consult Post Acute Care Choice: Hospice                               Social Determinants of Health (SDOH) Interventions SDOH Screenings   Food Insecurity: No Food Insecurity (03/26/2023)  Housing: Low Risk  (03/26/2023)  Transportation Needs: No Transportation Needs (03/26/2023)  Utilities: Not At Risk (03/26/2023)  Alcohol Screen: Low Risk  (10/12/2019)  Depression (PHQ2-9): Medium Risk (04/29/2022)  Financial Resource Strain: Low Risk  (03/10/2023)  Tobacco Use: Low Risk  (05/06/2023)     Readmission Risk Interventions     No data to display

## 2023-05-13 NOTE — Progress Notes (Signed)
Patient discharged home with [facility] transport and hospice.

## 2023-05-13 NOTE — Plan of Care (Signed)

## 2023-05-13 NOTE — Discharge Summary (Signed)
Physician Discharge Summary  Christian [name] VHQ:[id number] DOB: 01-Feb-1941 DOA: 05/09/2023  PCP: [name], PA  Admit date: 05/09/2023 Discharge date: 05/13/2023  Admitted From: (Home) Disposition:  (Home with  Hospice)  Recommendations for Outpatient Follow-up:  Management per hospice  Discharge Condition: ( hospice) CODE STATUS: ( DNR, Comfort Care) Diet recommendation:  Regular   Brief/Interim Summary:  The patient is a 82 yr old man who presents to [facility] from home via EMS after fall at home. This would be his second fall at home this week as he was found by home health on the floor 2 days ago.    The patient's recent hospitalization history includes:  03/07/2023 - 03/21/2023 with acute on chronic systolic heart failure, atrial fibrillation and acute PE. He was started on Eliquis during that stay and discharged to home.   03/22/2023 - 03/27/2023 Fall at home due to "slippery socks". Patient had a traumatic foley placement during this stay. During this stay the patient refused to take his Eliquis and it was discontinued. Although the plan had been for him to be discharged to SNF with palliative care he was discharged to home with home health.    The patient's past medical history is significant for hypertension,hyperlipidemia, CAD, systolic CHF with most recent EF of less than 20%, Aortic stenosis, CVA, PE, DM II, and mild cognitive impairment.   On this presentation the patient was found to be hypotensive in the ED. He had a sodium of 128, potassium of 2.6, BUN 16, Creatinine 1.36, Anion Gap of 20, T bili 1.8, CK of 92, and lactic acid of 2.7. CXR demonstrated mild left basilar atelectasis. UA was positive for UTI.    The patient received an 1800 cc bolus of IV fluid in the ED and started on Rocephin.   Blood culture x 2 and UCx have been collected.   His workup has been significant for severe sepsis secondary to complicated UTI, Foley related, overall patient with severe  deconditioning, failure to thrive, with very poor prognosis in the setting of full comorbidities so Palliative care has been reconsulted. The patient is confirmed to be DNR. Family does not want escalation of care. Should this be required they would choose comfort care.  To discharge home with hospice.     Severe sepsis secondary to complicated UTI due to Foley -Severe sepsis present on admission. Hypotension, leukocytosis, AKI, AMS, lactic acidosis, UTI is source -Foley changed in ED -Continue with midodrine -Treated with IV Rocephin, he will be discharged on Omnicef for total of 7 days treatment. -Pt is DNR/DNI with no escalation of care.  Discharged home with hospice   Fall at home Impaired mobility -Back pain secondary to his fall, continue with as needed pain medicine  Hypotension -This appears to be multifactorial, combination of septic shock and cardiogenic shock with known EF 20%.-At this point goals of care is palliative, and gearing towards comfort, with no aggressive measures or lines, so we will continue with midodrine and uptitrate as needed.   Hypokalemia Repleted   Chronic systolic congestive heart failure Aortic stenosis Patient noted to have EF of less than 20% with aortic stenosis for which she was not considered a candidate for TAVR.   Marland Kitchen   History of pulmonary embolism Patient has been diagnosed with bilateral pulmonary embolism on 5/18. No longer on anticoagulation at patient request during prior hospitalization.   Paroxsymal Atrial fibrillation Patient appears rate controlled at this time. -Continue amiodarone   Diabetes mellitus type 2 Patient's  last hemoglobin A1c 7 on 6/5.  He is not on medications at baseline and previously had increased risk for hypoglycemia.   CKD stage 3a Stable. Cr 1.36    Urinary retention -Continue foley    Mild cognitive impairment Patient appears to be alert and oriented to self and place, but not to time.          Discharge Diagnoses:  Principal Problem:   Complicated UTI (urinary tract infection) Active Problems:   Fall at home, subsequent encounter   Impaired mobility   Hypotension   Leukocytosis   Hypokalemia   Chronic systolic congestive heart failure (HCC)   History of pulmonary embolism   Paroxysmal atrial fibrillation (HCC)   Type 2 diabetes mellitus with hyperlipidemia (HCC)   Acute urinary retention   Chronic kidney disease, stage III (moderate) (HCC)   MCI (mild cognitive impairment)   Hospice care patient   Palliative care patient    Discharge Instructions  Discharge Instructions     Diet - low sodium heart healthy   Complete by: As directed    Discharge instructions   Complete by: As directed    Management per hospice at home.   Increase activity slowly   Complete by: As directed    No wound care   Complete by: As directed       Allergies as of 05/13/2023       Reactions   Lipitor [atorvastatin] Other (See Comments)   Damage to calf muscles   Vytorin [ezetimibe-simvastatin] Other (See Comments)   Damage to calf muscles   Amoxicillin Hives   Cephalexin Itching   Statins Other (See Comments)   Muscle pain   Tape Rash, Other (See Comments)   Broke out the skin        Medication List     STOP taking these medications    spironolactone 25 MG tablet Commonly known as: ALDACTONE       TAKE these medications    acetaminophen 325 MG tablet Commonly known as: TYLENOL Take 2 tablets (650 mg total) by mouth every 4 (four) hours as needed for mild pain (or temp > 37.5 C (99.5 F)).   albuterol 108 (90 Base) MCG/ACT inhaler Commonly known as: VENTOLIN HFA Inhale 2 puffs into the lungs every 6 (six) hours as needed for wheezing or shortness of breath.   amiodarone 200 MG tablet Commonly known as: PACERONE Take 1 tablet (200 mg total) by mouth 2 (two) times daily.   cefdinir 300 MG capsule Commonly known as: OMNICEF Take 1 capsule (300 mg total) by  mouth every 12 (twelve) hours.   cholecalciferol 1000 units tablet Commonly known as: VITAMIN D Take 1 tablet (1,000 Units total) by mouth daily.   clonazePAM 0.5 MG tablet Commonly known as: KlonoPIN Take 1 tablet (0.5 mg total) by mouth 2 (two) times daily as needed for anxiety.   Eliquis DVT/PE Starter Pack Generic drug: Apixaban Starter Pack (10mg  and 5mg ) Take 5-10 mg by mouth See admin instructions. Take as directed on package: start with two-5mg  tablets twice daily for 7 days. On day 8, switch to one-5mg  tablet twice daily.   escitalopram 20 MG tablet Commonly known as: Lexapro Take 1 tablet (20 mg total) by mouth daily. What changed: when to take this   esomeprazole 40 MG capsule Commonly known as: NEXIUM TAKE 1 CAPSULE BY MOUTH DAILY AS NEEDED FOR HEARTBURN What changed:  how much to take how to take this when to take this additional instructions  ezetimibe 10 MG tablet Commonly known as: ZETIA Take 1 tablet (10 mg total) by mouth daily.   furosemide 40 MG tablet Commonly known as: LASIX Take 1 tablet (40 mg total) by mouth daily as needed for edema or fluid. What changed:  when to take this reasons to take this   midodrine 10 MG tablet Commonly known as: PROAMATINE Take 1 tablet (10 mg total) by mouth 3 (three) times daily with meals. What changed:  medication strength how much to take   morphine 10 MG/5ML solution Take 1.3 mLs (2.6 mg total) by mouth every 4 (four) hours as needed for severe pain.   olopatadine 0.1 % ophthalmic solution Commonly known as: PATANOL INSTILL 1 DROP INTO BOTH EYS DAILY AS NEEDED FOR ALLERGIES What changed: See the new instructions.   ondansetron 4 MG disintegrating tablet Commonly known as: ZOFRAN-ODT TAKE 2 TABLETS BY MOUTH AS NEEDED What changed:  how much to take how to take this when to take this reasons to take this additional instructions   OXYGEN Inhale into the lungs as needed (shortness of breath).    potassium chloride 10 MEQ tablet Commonly known as: KLOR-CON M Take 1 tablet (10 mEq total) by mouth daily.   rosuvastatin 40 MG tablet Commonly known as: CRESTOR Take 1 tablet (40 mg total) by mouth daily.   tamsulosin 0.4 MG Caps capsule Commonly known as: FLOMAX Take 1 capsule (0.4 mg total) by mouth daily after supper.   Xeroform Petrolat Patch 4"x4" Pads Apply 1 patch topically daily as needed.        Follow-up Information     [facility] Follow up.   Specialty: Hospice and Palliative Medicine Why: home hospice services Contact information: [address] [place] [address] [phone]               Allergies  Allergen Reactions   Lipitor [Atorvastatin] Other (See Comments)    Damage to calf muscles   Vytorin [Ezetimibe-Simvastatin] Other (See Comments)    Damage to calf muscles   Amoxicillin Hives   Cephalexin Itching   Statins Other (See Comments)    Muscle pain    Tape Rash and Other (See Comments)    Broke out the skin    Consultations: Palliaitve   Procedures/Studies: CT Head Wo Contrast  Result Date: 05/09/2023 CLINICAL DATA:  Head trauma, minor (Age >= 65y); Neck trauma (Age >= 65y) EXAM: CT HEAD WITHOUT CONTRAST CT CERVICAL SPINE WITHOUT CONTRAST TECHNIQUE: Multidetector CT imaging of the head and cervical spine was performed following the standard protocol without intravenous contrast. Multiplanar CT image reconstructions of the cervical spine were also generated. RADIATION DOSE REDUCTION: This exam was performed according to the departmental dose-optimization program which includes automated exposure control, adjustment of the mA and/or kV according to patient size and/or use of iterative reconstruction technique. COMPARISON:  CT head 05/09/23 FINDINGS: CT HEAD FINDINGS Brain: Chronic right PCA territory infarct involving the right occipital lobe. Sequela of moderate chronic microvascular ischemic change. No  hydrocephalus. No CT evidence of an acute cortical infarct. No hemorrhage. No extra-axial fluid collection. Vascular: No hyperdense vessel or unexpected calcification. Skull: Normal. Negative for fracture or focal lesion. Sinuses/Orbits: No middle ear or mastoid effusion. Paranasal sinuses are clear. Orbits are unremarkable. Other: None. CT CERVICAL SPINE FINDINGS Alignment: Straightening of the normal cervical lordosis. Trace anterolisthesis of C2 on C3 and C4 on C5. Mild retrolisthesis of C3 on C4. Skull base and vertebrae: No acute fracture. No primary bone  lesion or focal pathologic process. Soft tissues and spinal canal: No prevertebral fluid or swelling. No visible canal hematoma. Disc levels:  No evidence of high-grade spinal canal stenosis. Upper chest: Negative. Other: None IMPRESSION: 1. No acute intracranial abnormality. Chronic right PCA territory infarct. 2. No acute fracture or traumatic malalignment of the cervical spine. Electronically Signed   By: Lorenza Cambridge M.D.   On: 05/09/2023 13:24   CT Cervical Spine Wo Contrast  Result Date: 05/09/2023 CLINICAL DATA:  Head trauma, minor (Age >= 65y); Neck trauma (Age >= 65y) EXAM: CT HEAD WITHOUT CONTRAST CT CERVICAL SPINE WITHOUT CONTRAST TECHNIQUE: Multidetector CT imaging of the head and cervical spine was performed following the standard protocol without intravenous contrast. Multiplanar CT image reconstructions of the cervical spine were also generated. RADIATION DOSE REDUCTION: This exam was performed according to the departmental dose-optimization program which includes automated exposure control, adjustment of the mA and/or kV according to patient size and/or use of iterative reconstruction technique. COMPARISON:  CT head 05/09/23 FINDINGS: CT HEAD FINDINGS Brain: Chronic right PCA territory infarct involving the right occipital lobe. Sequela of moderate chronic microvascular ischemic change. No hydrocephalus. No CT evidence of an acute cortical  infarct. No hemorrhage. No extra-axial fluid collection. Vascular: No hyperdense vessel or unexpected calcification. Skull: Normal. Negative for fracture or focal lesion. Sinuses/Orbits: No middle ear or mastoid effusion. Paranasal sinuses are clear. Orbits are unremarkable. Other: None. CT CERVICAL SPINE FINDINGS Alignment: Straightening of the normal cervical lordosis. Trace anterolisthesis of C2 on C3 and C4 on C5. Mild retrolisthesis of C3 on C4. Skull base and vertebrae: No acute fracture. No primary bone lesion or focal pathologic process. Soft tissues and spinal canal: No prevertebral fluid or swelling. No visible canal hematoma. Disc levels:  No evidence of high-grade spinal canal stenosis. Upper chest: Negative. Other: None IMPRESSION: 1. No acute intracranial abnormality. Chronic right PCA territory infarct. 2. No acute fracture or traumatic malalignment of the cervical spine. Electronically Signed   By: Lorenza Cambridge M.D.   On: 05/09/2023 13:24   DG Chest Port 1 View  Result Date: 05/09/2023 CLINICAL DATA:  Questionable sepsis - evaluate for abnormality. Fall. EXAM: PORTABLE CHEST 1 VIEW COMPARISON:  Chest radiograph 03/22/2023 FINDINGS: The cardiomediastinal silhouette is unchanged with the heart remaining mildly enlarged. Aortic atherosclerosis and an azygous lobe are noted. Lung aeration is improved compared to the prior study. Mild left basilar opacity likely reflects atelectasis. No overt pulmonary edema, sizable pleural effusion, or pneumothorax is identified. No acute osseous abnormality is seen. IMPRESSION: Mild left basilar atelectasis. Electronically Signed   By: Sebastian Ache M.D.   On: 05/09/2023 12:46      Subjective:  No significant events overnight as discussed with staff, patient wants to go home soon today. Discharge Exam: Vitals:   05/13/23 1147 05/13/23 1210  BP:    Pulse:    Resp:    Temp: 97.8 F (36.6 C)   SpO2:  90%   Vitals:   05/13/23 0900 05/13/23 1130  05/13/23 1147 05/13/23 1210  BP:      Pulse:      Resp:  16    Temp:   97.8 F (36.6 C)   TempSrc:   Axillary   SpO2: 91%   90%  Weight:        General: Pt is alert, awake, he is chronically ill appearing, deconditioned, extremely hard of hearing Cardiovascular: RRR, S1/S2 +, no rubs, no gallops Respiratory: CTA bilaterally, no wheezing, no rhonchi  Abdominal: Soft, NT, ND, bowel sounds + Extremities: no edema, no cyanosis    The results of significant diagnostics from this hospitalization (including imaging, microbiology, ancillary and laboratory) are listed below for reference.     Microbiology: Recent Results (from the past 240 hour(s))  Blood Culture (routine x 2)     Status: None (Preliminary result)   Collection Time: 05/09/23 12:23 PM   Specimen: BLOOD RIGHT FOREARM  Result Value Ref Range Status   Specimen Description BLOOD RIGHT FOREARM  Final   Special Requests   Final    BOTTLES DRAWN AEROBIC AND ANAEROBIC Blood Culture results may not be optimal due to an inadequate volume of blood received in culture bottles   Culture   Final    NO GROWTH 4 DAYS Performed at [facility], [address], [place], [place] [address]    Report Status PENDING  Incomplete  Urine Culture     Status: Abnormal   Collection Time: 05/09/23  2:04 PM   Specimen: Urine, Random  Result Value Ref Range Status   Specimen Description URINE, RANDOM  Final   Special Requests   Final    NONE Reflexed from [id number] Performed at [facility], [address], [place], [place] [address]    Culture MULTIPLE SPECIES PRESENT, SUGGEST RECOLLECTION (A)  Final   Report Status 05/11/2023 FINAL  Final  Blood Culture (routine x 2)     Status: None (Preliminary result)   Collection Time: 05/09/23  4:23 PM   Specimen: BLOOD LEFT ARM  Result Value Ref Range Status   Specimen Description BLOOD LEFT ARM  Final   Special Requests   Final    BOTTLES DRAWN AEROBIC AND ANAEROBIC Blood Culture  adequate volume   Culture   Final    NO GROWTH 4 DAYS Performed at [facility], [address] [place], [place] [address]    Report Status PENDING  Incomplete     Labs: BNP (last 3 results) Recent Labs    03/07/23 0952 03/23/23 0819  BNP 2,668.0* 1,758.0*   Basic Metabolic Panel: Recent Labs  Lab 05/09/23 1216 05/09/23 1305 05/10/23 0646 05/10/23 1750 05/11/23 0551 05/12/23 0905  NA 128* 123* 129* 131* 132* 133*  K 2.6* 2.8* 2.5* 3.2* 2.8* 3.7  CL 83* 87* 88* 92* 94* 95*  CO2 25  --  25 22 24 27   GLUCOSE 109* 104* 80 74 62* 82  BUN 16 18 11 10 8  6*  CREATININE 1.36* 1.30* 1.10 1.07 1.11 1.11  CALCIUM 8.6*  --  8.0* 8.0* 8.1* 8.2*   Liver Function Tests: Recent Labs  Lab 05/09/23 1216 05/10/23 0646  AST 21 17  ALT 13 7  ALKPHOS 58 47  BILITOT 1.8* 1.1  PROT 6.3* 4.7*  ALBUMIN 2.8* 2.1*   No results for input(s): "LIPASE", "AMYLASE" in the last 168 hours. No results for input(s): "AMMONIA" in the last 168 hours. CBC: Recent Labs  Lab 05/09/23 1216 05/09/23 1305 05/10/23 0646 05/11/23 0551  WBC 11.0*  --  6.7 5.3  NEUTROABS 8.4*  --   --  4.0  HGB 11.8* 12.6* 9.0* 9.3*  HCT 37.1* 37.0* 28.1* 29.7*  MCV 84.9  --  81.0 83.7  PLT 275  --  226 211   Cardiac Enzymes: Recent Labs  Lab 05/09/23 1216  CKTOTAL 92   BNP: Invalid input(s): "POCBNP" CBG: Recent Labs  Lab 05/12/23 1109 05/12/23 1547 05/12/23 2352 05/13/23 0756 05/13/23 1132  GLUCAP 113* 110* 73 59* 76   D-Dimer No results for input(s): "DDIMER" in the last 72 hours. Hgb A1c No results for input(s): "HGBA1C" in the last 72 hours. Lipid Profile No results for input(s): "CHOL", "HDL", "LDLCALC", "TRIG", "CHOLHDL", "LDLDIRECT" in the last 72 hours. Thyroid function studies No results for input(s): "TSH", "T4TOTAL", "T3FREE", "THYROIDAB" in the last 72 hours.  Invalid input(s): "FREET3" Anemia work up No results for input(s): "VITAMINB12", "FOLATE", "FERRITIN", "TIBC",  "IRON", "RETICCTPCT" in the last 72 hours. Urinalysis    Component Value Date/Time   COLORURINE AMBER (A) 05/09/2023 1404   APPEARANCEUR CLOUDY (A) 05/09/2023 1404   APPEARANCEUR Clear 04/07/2018 1459   LABSPEC 1.012 05/09/2023 1404   PHURINE 5.0 05/09/2023 1404   GLUCOSEU NEGATIVE 05/09/2023 1404   GLUCOSEU NEGATIVE 01/06/2018 1602   HGBUR MODERATE (A) 05/09/2023 1404   BILIRUBINUR NEGATIVE 05/09/2023 1404   BILIRUBINUR 1 01/22/2023 1143   BILIRUBINUR Negative 04/07/2018 1459   KETONESUR NEGATIVE 05/09/2023 1404   PROTEINUR 100 (A) 05/09/2023 1404   UROBILINOGEN 1.0 01/22/2023 1143   UROBILINOGEN 0.2 01/06/2018 1602   NITRITE NEGATIVE 05/09/2023 1404   LEUKOCYTESUR LARGE (A) 05/09/2023 1404   Sepsis Labs Recent Labs  Lab 05/09/23 1216 05/10/23 0646 05/11/23 0551  WBC 11.0* 6.7 5.3   Microbiology Recent Results (from the past 240 hour(s))  Blood Culture (routine x 2)     Status: None (Preliminary result)   Collection Time: 05/09/23 12:23 PM   Specimen: BLOOD RIGHT FOREARM  Result Value Ref Range Status   Specimen Description BLOOD RIGHT FOREARM  Final   Special Requests   Final    BOTTLES DRAWN AEROBIC AND ANAEROBIC Blood Culture results may not be optimal due to an inadequate volume of blood received in culture bottles   Culture   Final    NO GROWTH 4 DAYS Performed at [facility], [address] [place], [place] [address]    Report Status PENDING  Incomplete  Urine Culture     Status: Abnormal   Collection Time: 05/09/23  2:04 PM   Specimen: Urine, Random  Result Value Ref Range Status   Specimen Description URINE, RANDOM  Final   Special Requests   Final    NONE Reflexed from [id number] Performed at [facility], [address] [place], [place] [address]    Culture MULTIPLE SPECIES PRESENT, SUGGEST RECOLLECTION (A)  Final   Report Status 05/11/2023 FINAL  Final  Blood Culture (routine x 2)     Status: None (Preliminary result)   Collection Time:  05/09/23  4:23 PM   Specimen: BLOOD LEFT ARM  Result Value Ref Range Status   Specimen Description BLOOD LEFT ARM  Final   Special Requests   Final    BOTTLES DRAWN AEROBIC AND ANAEROBIC Blood Culture adequate volume   Culture   Final    NO GROWTH 4 DAYS Performed at [facility], [address], [place], [place] [address]    Report Status PENDING  Incomplete     Time coordinating discharge: Over 30 minutes  SIGNED:   Huey Bienenstock, MD  [facility] 05/13/2023, 1:20 PM Pager   If 7PM-7AM, please contact night-coverage [email or web address] Password [email or web address]

## 2023-05-13 NOTE — Telephone Encounter (Signed)
Called Sherri with [facility], told her Lelon Mast would like orders for comfort to come from the Hospice physician for patient. Sherri verbalized understanding.

## 2023-05-14 LAB — CULTURE, BLOOD (ROUTINE X 2): Culture: NO GROWTH

## 2023-05-18 ENCOUNTER — Telehealth: Payer: Self-pay | Admitting: Physician Assistant

## 2023-05-18 NOTE — Telephone Encounter (Signed)
Patient dropped off document Home Health Certificate (Order ID [id number]), to be filled out by provider. Patient requested to send it back via Fax . Document is located in providers tray at front office.Please advise

## 2023-05-19 NOTE — Telephone Encounter (Signed)
Forms signed by Lelon Mast and faxed back.

## 2023-05-25 ENCOUNTER — Ambulatory Visit: Payer: Medicare Other | Admitting: Neurology

## 2023-06-21 DEATH — deceased
